# Patient Record
Sex: Male | Born: 1950
Health system: Southern US, Community
[De-identification: ages and names within clinical notes are randomized; demographics above are authoritative.]

## PROBLEM LIST (undated history)

## (undated) DIAGNOSIS — I480 Paroxysmal atrial fibrillation: Secondary | ICD-10-CM

## (undated) DIAGNOSIS — J449 Chronic obstructive pulmonary disease, unspecified: Secondary | ICD-10-CM

## (undated) DIAGNOSIS — E7800 Pure hypercholesterolemia, unspecified: Secondary | ICD-10-CM

## (undated) DIAGNOSIS — Z9981 Dependence on supplemental oxygen: Secondary | ICD-10-CM

## (undated) DIAGNOSIS — I472 Ventricular tachycardia, unspecified: Secondary | ICD-10-CM

## (undated) DIAGNOSIS — M1990 Unspecified osteoarthritis, unspecified site: Secondary | ICD-10-CM

## (undated) DIAGNOSIS — I509 Heart failure, unspecified: Secondary | ICD-10-CM

## (undated) DIAGNOSIS — I2510 Atherosclerotic heart disease of native coronary artery without angina pectoris: Secondary | ICD-10-CM

## (undated) DIAGNOSIS — I5020 Unspecified systolic (congestive) heart failure: Secondary | ICD-10-CM

## (undated) DIAGNOSIS — K219 Gastro-esophageal reflux disease without esophagitis: Secondary | ICD-10-CM

## (undated) DIAGNOSIS — I2699 Other pulmonary embolism without acute cor pulmonale: Secondary | ICD-10-CM

## (undated) DIAGNOSIS — G4730 Sleep apnea, unspecified: Secondary | ICD-10-CM

## (undated) DIAGNOSIS — I219 Acute myocardial infarction, unspecified: Secondary | ICD-10-CM

## (undated) DIAGNOSIS — I255 Ischemic cardiomyopathy: Secondary | ICD-10-CM

## (undated) DIAGNOSIS — Z950 Presence of cardiac pacemaker: Secondary | ICD-10-CM

## (undated) HISTORY — PX: CORONARY ANGIOPLASTY WITH STENT PLACEMENT: SHX49

## (undated) HISTORY — PX: TONSILLECTOMY: SUR1361

## (undated) NOTE — *Deleted (*Deleted)
[facility] EMERGENCY DEPARTMENT Provider Note   CSN: [id number] Arrival date & time: 12/06/19  2148     History Chief Complaint  Patient presents with  . Fall  . Pacemaker Problem    Max Rivas is a 89 y.o. male with a hx of CHF ***, S/p ICD placement, ischemic cardiomyopathy, CAD, paroxysmal afib, vtach, chronic anticoagulation on Eliquis, COPD, chronic respiratory failure on 3L via [place],  prior pulmonary embolism, sleep apnea,   Patient has history of V. tach.  He has a pacer defibrillator that was replaced September 14/2021 at [facility] by Dr. Rhea Belton Shantha.  He also had a VT ablation done at that time.  Patient reports he is compliant with his medications.  He has taken his amiodarone and Eliquis this morning.  He took usual doses last night.  He also takes Toprol XL and reports Mexitil as a new medication for him after his ablation.  Yesterday evening, patient reports he did have some chest pain.  It had pressure quality was very uncomfortable.  He took his medications and waited it out.  He reports he could feel his heart racing.  His heart rate was ranging from 130s to 150s.  He reports his blood pressures were high last night.  [facility]  They advised him to go to the emergency department.  Patient reports he no longer has any chest pain today.  He does not feel lightheaded at this time.  He has not had any syncopal episodes.  He has not been experiencing any pain in his calves or swelling in his legs.  HPI     Past Medical History:  Diagnosis Date  . Arthritis    "elbows, knees" (02/24/2016)  . Bronchitis 2006  . CAD (coronary artery disease)    a. s/p prior MIs - 1995 x 2, 1998; b. s/p prior LCX stenting; c. 04/2019 Cath: LM min irregs, LAD 65p/mi, D1 75, LCX 99ost/p, 44m (ISR), OM2 80, RCA/RPDA mod diff dzs; d. 05/2019 CABG x 3: LIMA->LAD, VG->Diag, VG->OM.  Marland Kitchen COPD (chronic  obstructive pulmonary disease) (HCC)    a. Remote tobacco-->on home O2.  Marland Kitchen GERD (gastroesophageal reflux disease)   . 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 05/2019 Echo: EF 40-45%, gr2 DD. Nl RV size/fxn. Mildly dil LA. Mild MR.  . High cholesterol   . Ischemic cardiomyopathy    a. 05/2019 Echo: EF 40-45%.  . Morbid obesity (HCC)   . Myocardial infarction [facility]) ~ 1995 X 2;1998; 2000; 2004  . On home oxygen therapy    "2L w/activity" (02/24/2016)  . PAF (paroxysmal atrial fibrillation) (HCC)    a. CHA2DS2VASc = 4-->eliquis. Also on amio.  . Pulmonary embolism (HCC) 02/24/2016  . Sleep apnea    "have CPAP; can't tolerate it" (02/24/2016)  . Ventricular tachycardia (HCC)    a. 2005 s/p ICD; b. 03/2011 Device upgrade/lead exchange: MDT Protecta XT VR single lead AICD.    Patient Active Problem List   Diagnosis Date Noted  . Vomiting   . Acute respiratory failure with hypoxia (HCC) 09/18/2019  . Acute on chronic heart failure (HCC) 09/08/2019  . Unstable angina (HCC) 05/21/2019  . V-tach (HCC) 05/12/2019  . Chest pain   . VT (ventricular tachycardia) (HCC) 05/11/2019  . Arrhythmia 03/12/2019  . On home O2   . Chronic combined systolic and diastolic CHF (congestive heart failure) (HCC)  06/22/2018  . Chronic respiratory failure with hypoxia (HCC) 06/22/2018  . Congestive heart failure (CHF) (HCC) 06/22/2018  . DCM (dilated cardiomyopathy) (HCC) 05/25/2017  . Coronary artery disease 05/25/2017  . Chronic atrial fibrillation 05/25/2017  . COPD (chronic obstructive pulmonary disease) (HCC) 05/25/2017  . CKD (chronic kidney disease) 05/25/2017  . Hypothyroidism 05/25/2017  . 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 OSA (obstructive sleep apnea) 05/25/2017  . Pulmonary HTN (HCC) 05/25/2017  . Mediastinal adenopathy   . Cervical adenopathy   . Acute respiratory failure with hypoxia and hypercarbia (HCC) 02/24/2016  . Pulmonary  embolus (HCC) 02/24/2016  . Pulmonary artery thrombosis (HCC)   . Pleural effusion   . Lung mass   . Pleural plaque   . Flutter-fibrillation 01/15/2016  . Acute systolic congestive heart failure (HCC)   . Atrial fibrillation with RVR (HCC)   . Hypercholesterolemia 03/11/2006  . Tobacco abuse 03/11/2006  . Acute on chronic systolic congestive heart failure (HCC) 03/11/2006  . GASTROESOPHAGEAL REFLUX, NO ESOPHAGITIS 03/11/2006  . OSTEOARTHRITIS, MULTI SITES 03/11/2006  . HIGH RISK PATIENT 03/11/2006  . Tobacco dependence 03/11/2006    Past Surgical History:  Procedure Laterality Date  . CARDIOVERSION N/A 09/11/2019   Procedure: CARDIOVERSION;  Surgeon: Jodelle Red, MD;  Location: [facility] ENDOSCOPY;  Service: Cardiovascular;  Laterality: N/A;  . CLIPPING OF ATRIAL APPENDAGE N/A 05/23/2019   Procedure: CLIPPING OF ATRIAL APPENDAGE with atriclip;  Surgeon: [name], MD;  Location: [facility] OR;  Service: Open Heart Surgery;  Laterality: N/A;  . CORONARY ANGIOPLASTY  1995  . CORONARY ANGIOPLASTY WITH STENT PLACEMENT  ~ 1995 - 2004 X 5   "I've got a total of 5 stents" (02/24/2016)  . CORONARY ARTERY BYPASS GRAFT N/A 05/23/2019   Procedure: CORONARY ARTERY BYPASS GRAFTING (CABG), times three, using right greater saphenous vein and left internal mammary;  Surgeon: [name], MD;  Location: [facility] OR;  Service: Open Heart Surgery;  Laterality: N/A;  [place] only  . 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 INTRAVASCULAR PRESSURE WIRE/FFR STUDY N/A 05/12/2019   Procedure: INTRAVASCULAR PRESSURE WIRE/FFR STUDY;  Surgeon: Lyn Records, MD;  Location: [facility] INVASIVE CV LAB;  Service: Cardiovascular;  Laterality: N/A;  . LEFT HEART CATH AND CORONARY ANGIOGRAPHY N/A 05/12/2019   Procedure: LEFT HEART CATH AND CORONARY ANGIOGRAPHY;  Surgeon: Lyn Records, MD;  Location: [facility] INVASIVE CV LAB;  Service: Cardiovascular;  Laterality: N/A;  . PACEMAKER  GENERATOR CHANGE  03/2011    [facility]  . TEE WITHOUT CARDIOVERSION N/A 05/23/2019   Procedure: TRANSESOPHAGEAL ECHOCARDIOGRAM (TEE);  Surgeon: [name], MD;  Location: [facility] OR;  Service: Open Heart Surgery;  Laterality: N/A;  . TONSILLECTOMY         Family History  Problem Relation Age of Onset  . Heart attack Mother   . Heart attack Father   . Heart attack Sister 87    Social History   Tobacco Use  . Smoking status: Former Smoker    Packs/day: 3.00    Years: 35.00    Pack years: 105.00    Types: Cigarettes    Quit date: 05/22/2019    Years since quitting: 0.5  . Smokeless tobacco: Former Neurosurgeon    Quit date: 05/21/2017  Vaping Use  . Vaping Use: Never used  Substance Use Topics  . Alcohol use: Not Currently  . Drug use: Not Currently  Types: Cocaine    Comment: none since 1995    Home Medications Prior to Admission medications   Medication Sig Start Date End Date Taking? Authorizing Provider  acetaminophen (TYLENOL) 325 MG tablet Take 2 tablets (650 mg total) by mouth every 6 (six) hours as needed for mild pain (or Fever >/= 101). 03/05/16   Alison Murray, MD  apixaban (ELIQUIS) 5 MG TABS tablet Take 5 mg by mouth 2 (two) times daily.    [provider]  aspirin 81 MG chewable tablet Chew 1 tablet (81 mg total) by mouth daily. 05/16/19   Kroeger, Ovidio Kin., PA-C  atorvastatin (LIPITOR) 80 MG tablet Take 1 tablet (80 mg total) by mouth daily. Patient taking differently: Take 80 mg by mouth every evening.  05/15/19   Kroeger, Ovidio Kin., PA-C  carboxymethylcellulose (REFRESH PLUS) 0.5 % SOLN Place 1 drop into both eyes at bedtime.    [provider]  Cholecalciferol (VITAMIN D3) 50 MCG (2000 UT) TABS Take 2,000 Units by mouth daily.    [provider]  furosemide (LASIX) 40 MG tablet Take 40 mg by mouth 2 (two) times daily.     [provider]  gabapentin (NEURONTIN) 100 MG capsule Take 2 capsules (200 mg total) by mouth 2 (two) times  daily. 06/01/19   Barrett, Erin R, PA-C  levalbuterol (XOPENEX HFA) 45 MCG/ACT inhaler Inhale 2 puffs into the lungs every 6 (six) hours as needed for wheezing or shortness of breath.    [provider]  levothyroxine (SYNTHROID) 75 MCG tablet Take 75 mcg by mouth daily before breakfast.    [provider]  metoprolol succinate (TOPROL-XL) 25 MG 24 hr tablet Take 12.5 mg by mouth daily.    [provider]  mexiletine (MEXITIL) 150 MG capsule Take 150 mg by mouth 2 (two) times daily.    [provider]  mometasone [facility]) 220 MCG/INH inhaler Inhale 2 puffs into the lungs 2 (two) times daily.     [provider]  montelukast (SINGULAIR) 10 MG tablet Take 10 mg by mouth daily.     [provider]  Multiple Vitamin (MULTIVITAMIN WITH MINERALS) TABS tablet Take 1 tablet by mouth daily.    [provider]  nitroGLYCERIN (NITROSTAT) 0.4 MG SL tablet Place 0.4 mg under the tongue every 5 (five) minutes as needed for chest pain.     [provider]  omega-3 acid ethyl esters (LOVAZA) 1 g capsule Take 1 capsule (1 g total) by mouth 2 (two) times daily. 03/05/16   Alison Murray, MD  omeprazole (PRILOSEC) 20 MG capsule Take 20 mg by mouth 2 (two) times daily before a meal.    [provider]  predniSONE (DELTASONE) 20 MG tablet Take 2 tablets (40 mg total) by mouth daily with breakfast. 09/24/19   Cipriano Bunker, MD  Propylene Glycol (SYSTANE BALANCE) 0.6 % SOLN Place 1 drop into both eyes See admin instructions. Place 1 drop into each eye four times a day and apply a warm compress for 10 minutes afterwards    [provider]  Tiotropium Bromide-Olodaterol (STIOLTO RESPIMAT) 2.5-2.5 MCG/ACT AERS Inhale 2 puffs into the lungs daily.     [provider]    Allergies    Crestor [rosuvastatin calcium]  Review of Systems   Review of Systems  Physical Exam Updated Vital Signs BP 115/61 (BP Location: Right Arm)    Pulse 71   Temp 98.2 F (36.8 C) (Oral)   Resp  11   Ht 5\' 11"  (1.803 m)   Wt 91.6 kg   SpO2 100%   BMI 28.17 kg/m   Physical Exam  ED Results / Procedures / Treatments   Labs (all labs ordered are listed, but only abnormal results are displayed) Labs Reviewed - No data to display  EKG None  Radiology No results found.  Procedures Procedures (including critical care time)  Medications Ordered in ED Medications - No data to display  ED Course  I have reviewed the triage vital signs and the nursing notes.  Pertinent labs & imaging results that were available during my care of the patient were reviewed by me and considered in my medical decision making (see chart for details).    MDM Rules/Calculators/A&P                          *** Final Clinical Impression(s) / ED Diagnoses Final diagnoses:  None    Rx / DC Orders ED Discharge Orders    None

---

## 1993-01-12 HISTORY — PX: CORONARY ANGIOPLASTY: SHX604

## 1999-11-19 ENCOUNTER — Encounter: Payer: Self-pay | Admitting: Emergency Medicine

## 1999-11-19 ENCOUNTER — Inpatient Hospital Stay (HOSPITAL_COMMUNITY): Admission: EM | Admit: 1999-11-19 | Discharge: 1999-11-22 | Payer: Self-pay | Admitting: Emergency Medicine

## 2002-09-28 ENCOUNTER — Encounter: Admission: RE | Admit: 2002-09-28 | Discharge: 2002-09-28 | Payer: Self-pay | Admitting: Family Medicine

## 2002-11-01 ENCOUNTER — Encounter: Admission: RE | Admit: 2002-11-01 | Discharge: 2002-11-01 | Payer: Self-pay | Admitting: Family Medicine

## 2003-02-23 ENCOUNTER — Ambulatory Visit (HOSPITAL_COMMUNITY): Admission: RE | Admit: 2003-02-23 | Discharge: 2003-02-23 | Payer: Self-pay | Admitting: *Deleted

## 2003-03-22 ENCOUNTER — Encounter: Admission: RE | Admit: 2003-03-22 | Discharge: 2003-03-22 | Payer: Self-pay | Admitting: Family Medicine

## 2003-04-01 ENCOUNTER — Encounter: Admission: RE | Admit: 2003-04-01 | Discharge: 2003-04-01 | Payer: Self-pay | Admitting: Sports Medicine

## 2003-04-02 ENCOUNTER — Encounter: Admission: RE | Admit: 2003-04-02 | Discharge: 2003-04-02 | Payer: Self-pay | Admitting: Family Medicine

## 2003-04-17 ENCOUNTER — Encounter: Admission: RE | Admit: 2003-04-17 | Discharge: 2003-04-17 | Payer: Self-pay | Admitting: Family Medicine

## 2003-05-03 ENCOUNTER — Encounter: Admission: RE | Admit: 2003-05-03 | Discharge: 2003-05-03 | Payer: Self-pay | Admitting: Family Medicine

## 2003-05-30 ENCOUNTER — Encounter: Admission: RE | Admit: 2003-05-30 | Discharge: 2003-05-30 | Payer: Self-pay | Admitting: Family Medicine

## 2003-06-14 ENCOUNTER — Encounter: Admission: RE | Admit: 2003-06-14 | Discharge: 2003-06-14 | Payer: Self-pay | Admitting: Family Medicine

## 2003-07-09 ENCOUNTER — Encounter: Admission: RE | Admit: 2003-07-09 | Discharge: 2003-07-09 | Payer: Self-pay | Admitting: Family Medicine

## 2003-07-09 ENCOUNTER — Ambulatory Visit (HOSPITAL_COMMUNITY): Admission: RE | Admit: 2003-07-09 | Discharge: 2003-07-09 | Payer: Self-pay | Admitting: Family Medicine

## 2003-07-12 ENCOUNTER — Ambulatory Visit (HOSPITAL_COMMUNITY): Admission: RE | Admit: 2003-07-12 | Discharge: 2003-07-12 | Payer: Self-pay | Admitting: Cardiology

## 2003-07-13 HISTORY — PX: INSERT / REPLACE / REMOVE PACEMAKER: SUR710

## 2003-07-17 ENCOUNTER — Inpatient Hospital Stay (HOSPITAL_COMMUNITY): Admission: AD | Admit: 2003-07-17 | Discharge: 2003-07-18 | Payer: Self-pay | Admitting: Internal Medicine

## 2003-08-09 ENCOUNTER — Encounter: Admission: RE | Admit: 2003-08-09 | Discharge: 2003-08-09 | Payer: Self-pay | Admitting: Family Medicine

## 2003-09-05 ENCOUNTER — Encounter: Admission: RE | Admit: 2003-09-05 | Discharge: 2003-09-05 | Payer: Self-pay | Admitting: Family Medicine

## 2003-10-16 ENCOUNTER — Ambulatory Visit: Payer: Self-pay | Admitting: Internal Medicine

## 2003-11-02 ENCOUNTER — Ambulatory Visit: Payer: Self-pay | Admitting: Sports Medicine

## 2003-11-17 ENCOUNTER — Inpatient Hospital Stay (HOSPITAL_COMMUNITY): Admission: EM | Admit: 2003-11-17 | Discharge: 2003-11-17 | Payer: Self-pay | Admitting: Emergency Medicine

## 2003-12-04 ENCOUNTER — Ambulatory Visit: Payer: Self-pay | Admitting: Internal Medicine

## 2004-01-13 DIAGNOSIS — J40 Bronchitis, not specified as acute or chronic: Secondary | ICD-10-CM

## 2004-01-13 HISTORY — DX: Bronchitis, not specified as acute or chronic: J40

## 2004-11-11 ENCOUNTER — Ambulatory Visit: Payer: Self-pay | Admitting: Internal Medicine

## 2004-11-18 ENCOUNTER — Ambulatory Visit: Payer: Self-pay

## 2006-03-11 DIAGNOSIS — K219 Gastro-esophageal reflux disease without esophagitis: Secondary | ICD-10-CM | POA: Insufficient documentation

## 2006-03-11 DIAGNOSIS — F17200 Nicotine dependence, unspecified, uncomplicated: Secondary | ICD-10-CM | POA: Insufficient documentation

## 2006-03-11 DIAGNOSIS — I5023 Acute on chronic systolic (congestive) heart failure: Secondary | ICD-10-CM | POA: Insufficient documentation

## 2006-03-11 DIAGNOSIS — I2510 Atherosclerotic heart disease of native coronary artery without angina pectoris: Secondary | ICD-10-CM | POA: Insufficient documentation

## 2006-03-11 DIAGNOSIS — Z789 Other specified health status: Secondary | ICD-10-CM | POA: Insufficient documentation

## 2006-03-11 DIAGNOSIS — M1990 Unspecified osteoarthritis, unspecified site: Secondary | ICD-10-CM | POA: Insufficient documentation

## 2006-03-11 DIAGNOSIS — Z720 Tobacco use: Secondary | ICD-10-CM | POA: Insufficient documentation

## 2006-03-11 DIAGNOSIS — E7800 Pure hypercholesterolemia, unspecified: Secondary | ICD-10-CM | POA: Insufficient documentation

## 2008-02-10 ENCOUNTER — Encounter (INDEPENDENT_AMBULATORY_CARE_PROVIDER_SITE_OTHER): Payer: Self-pay | Admitting: *Deleted

## 2008-08-27 ENCOUNTER — Encounter (INDEPENDENT_AMBULATORY_CARE_PROVIDER_SITE_OTHER): Payer: Self-pay | Admitting: *Deleted

## 2009-03-14 ENCOUNTER — Telehealth: Payer: Self-pay | Admitting: Gastroenterology

## 2010-02-13 NOTE — Progress Notes (Signed)
Summary: Schedule Colonoscopy  Phone Note Outgoing Call Call back at Home Phone [phone]   Call placed by: Harlow Mares CMA Duncan Dull),  March 14, 2009 3:01 PM Call placed to: Patient Summary of Call: Left message on patients machine to call back, need to know if patient is getting GI procedures done at the [facility] now or if he would like to have his colonoscopy done with our office. Either way if he could please call us back. He is due for his colonoscopy.  Initial call taken by: Harlow Mares CMA Duncan Dull),  March 14, 2009 3:04 PM  Follow-up for Phone Call        patient is having his colonoscopys done at the [place].  Follow-up by: Harlow Mares CMA Duncan Dull),  March 21, 2009 4:03 PM

## 2011-03-13 HISTORY — PX: PACEMAKER GENERATOR CHANGE: SHX5998

## 2016-01-15 ENCOUNTER — Inpatient Hospital Stay (HOSPITAL_COMMUNITY)
Admission: EM | Admit: 2016-01-15 | Discharge: 2016-01-19 | DRG: 291 | Disposition: A | Discharge status: Home / Self Care | Payer: Non-veteran care | Attending: Family Medicine | Admitting: Family Medicine

## 2016-01-15 ENCOUNTER — Encounter (HOSPITAL_COMMUNITY): Payer: Self-pay | Admitting: Emergency Medicine

## 2016-01-15 ENCOUNTER — Emergency Department (HOSPITAL_COMMUNITY): Payer: Non-veteran care

## 2016-01-15 DIAGNOSIS — I959 Hypotension, unspecified: Secondary | ICD-10-CM | POA: Diagnosis not present

## 2016-01-15 DIAGNOSIS — I4891 Unspecified atrial fibrillation: Secondary | ICD-10-CM | POA: Diagnosis present

## 2016-01-15 DIAGNOSIS — Z6835 Body mass index (BMI) 35.0-35.9, adult: Secondary | ICD-10-CM

## 2016-01-15 DIAGNOSIS — Z955 Presence of coronary angioplasty implant and graft: Secondary | ICD-10-CM

## 2016-01-15 DIAGNOSIS — Z87891 Personal history of nicotine dependence: Secondary | ICD-10-CM

## 2016-01-15 DIAGNOSIS — Z950 Presence of cardiac pacemaker: Secondary | ICD-10-CM

## 2016-01-15 DIAGNOSIS — Z95810 Presence of automatic (implantable) cardiac defibrillator: Secondary | ICD-10-CM

## 2016-01-15 DIAGNOSIS — G4733 Obstructive sleep apnea (adult) (pediatric): Secondary | ICD-10-CM | POA: Diagnosis present

## 2016-01-15 DIAGNOSIS — J449 Chronic obstructive pulmonary disease, unspecified: Secondary | ICD-10-CM | POA: Diagnosis present

## 2016-01-15 DIAGNOSIS — I5021 Acute systolic (congestive) heart failure: Secondary | ICD-10-CM

## 2016-01-15 DIAGNOSIS — Z8249 Family history of ischemic heart disease and other diseases of the circulatory system: Secondary | ICD-10-CM | POA: Diagnosis not present

## 2016-01-15 DIAGNOSIS — I4892 Unspecified atrial flutter: Secondary | ICD-10-CM | POA: Diagnosis present

## 2016-01-15 DIAGNOSIS — IMO0002 Reserved for concepts with insufficient information to code with codable children: Secondary | ICD-10-CM | POA: Diagnosis present

## 2016-01-15 DIAGNOSIS — I2510 Atherosclerotic heart disease of native coronary artery without angina pectoris: Secondary | ICD-10-CM | POA: Diagnosis present

## 2016-01-15 DIAGNOSIS — Z66 Do not resuscitate: Secondary | ICD-10-CM | POA: Diagnosis present

## 2016-01-15 DIAGNOSIS — J9601 Acute respiratory failure with hypoxia: Secondary | ICD-10-CM | POA: Diagnosis present

## 2016-01-15 DIAGNOSIS — I498 Other specified cardiac arrhythmias: Secondary | ICD-10-CM

## 2016-01-15 DIAGNOSIS — I252 Old myocardial infarction: Secondary | ICD-10-CM | POA: Diagnosis not present

## 2016-01-15 DIAGNOSIS — I420 Dilated cardiomyopathy: Secondary | ICD-10-CM | POA: Diagnosis present

## 2016-01-15 DIAGNOSIS — Z7982 Long term (current) use of aspirin: Secondary | ICD-10-CM

## 2016-01-15 DIAGNOSIS — N179 Acute kidney failure, unspecified: Secondary | ICD-10-CM | POA: Diagnosis present

## 2016-01-15 DIAGNOSIS — Z79899 Other long term (current) drug therapy: Secondary | ICD-10-CM | POA: Diagnosis not present

## 2016-01-15 DIAGNOSIS — I5023 Acute on chronic systolic (congestive) heart failure: Secondary | ICD-10-CM | POA: Diagnosis present

## 2016-01-15 DIAGNOSIS — Z23 Encounter for immunization: Secondary | ICD-10-CM | POA: Diagnosis not present

## 2016-01-15 DIAGNOSIS — K219 Gastro-esophageal reflux disease without esophagitis: Secondary | ICD-10-CM | POA: Diagnosis present

## 2016-01-15 HISTORY — DX: Heart failure, unspecified: I50.9

## 2016-01-15 HISTORY — DX: Presence of cardiac pacemaker: Z95.0

## 2016-01-15 HISTORY — DX: Chronic obstructive pulmonary disease, unspecified: J44.9

## 2016-01-15 HISTORY — DX: Gastro-esophageal reflux disease without esophagitis: K21.9

## 2016-01-15 HISTORY — DX: Atherosclerotic heart disease of native coronary artery without angina pectoris: I25.10

## 2016-01-15 LAB — CBC
HEMATOCRIT: 45.5 % (ref 39.0–52.0)
HEMOGLOBIN: 15 g/dL (ref 13.0–17.0)
MCH: 31.6 pg (ref 26.0–34.0)
MCHC: 33 g/dL (ref 30.0–36.0)
MCV: 96 fL (ref 78.0–100.0)
Platelets: 197 10*3/uL (ref 150–400)
RBC: 4.74 MIL/uL (ref 4.22–5.81)
RDW: 14.8 % (ref 11.5–15.5)
WBC: 7.8 10*3/uL (ref 4.0–10.5)

## 2016-01-15 LAB — INFLUENZA PANEL BY PCR (TYPE A & B)
INFLBPCR: NEGATIVE
Influenza A By PCR: NEGATIVE

## 2016-01-15 LAB — BASIC METABOLIC PANEL
ANION GAP: 10 (ref 5–15)
BUN: 14 mg/dL (ref 6–20)
CHLORIDE: 101 mmol/L (ref 101–111)
CO2: 27 mmol/L (ref 22–32)
Calcium: 9.2 mg/dL (ref 8.9–10.3)
Creatinine, Ser: 1.24 mg/dL (ref 0.61–1.24)
GFR calc non Af Amer: >60 mL/min (ref >60)
Glucose, Bld: 107 mg/dL — ABNORMAL HIGH (ref 65–99)
POTASSIUM: 4.3 mmol/L (ref 3.5–5.1)
SODIUM: 138 mmol/L (ref 135–145)

## 2016-01-15 LAB — I-STAT TROPONIN, ED: Troponin i, poc: 0.02 ng/mL (ref 0.00–0.08)

## 2016-01-15 LAB — TSH: TSH: 2.898 u[IU]/mL (ref 0.350–4.500)

## 2016-01-15 LAB — MRSA PCR SCREENING: MRSA BY PCR: NEGATIVE

## 2016-01-15 LAB — BRAIN NATRIURETIC PEPTIDE: B NATRIURETIC PEPTIDE 5: 599.4 pg/mL — AB (ref 0.0–100.0)

## 2016-01-15 LAB — MAGNESIUM: MAGNESIUM: 2.2 mg/dL (ref 1.7–2.4)

## 2016-01-15 MED ORDER — METOPROLOL SUCCINATE ER 25 MG PO TB24: 12.5000 mg | ORAL_TABLET | Freq: Every day | ORAL | Status: DC

## 2016-01-15 MED ORDER — METOPROLOL SUCCINATE ER 50 MG PO TB24
75.0000 mg | ORAL_TABLET | Freq: Two times a day (BID) | ORAL | Status: DC
  Administered 2016-01-15 – 2016-01-19 (×8): 75 mg via ORAL
  Filled 2016-01-15 (×8): qty 1

## 2016-01-15 MED ORDER — INFLUENZA VAC SPLIT QUAD 0.5 ML IM SUSY
0.5000 mL | PREFILLED_SYRINGE | INTRAMUSCULAR | Status: AC
  Administered 2016-01-16: 0.5 mL via INTRAMUSCULAR
  Filled 2016-01-15: qty 0.5

## 2016-01-15 MED ORDER — SODIUM CHLORIDE 0.9% FLUSH
3.0000 mL | Freq: Two times a day (BID) | INTRAVENOUS | Status: DC
  Administered 2016-01-15 – 2016-01-19 (×5): 3 mL via INTRAVENOUS

## 2016-01-15 MED ORDER — FUROSEMIDE 10 MG/ML IJ SOLN
40.0000 mg | Freq: Two times a day (BID) | INTRAMUSCULAR | Status: DC
  Administered 2016-01-16 – 2016-01-19 (×7): 40 mg via INTRAVENOUS
  Filled 2016-01-15 (×7): qty 4

## 2016-01-15 MED ORDER — ATORVASTATIN CALCIUM 20 MG PO TABS
20.0000 mg | ORAL_TABLET | Freq: Every day | ORAL | Status: DC
  Administered 2016-01-16 – 2016-01-18 (×3): 20 mg via ORAL
  Filled 2016-01-15 (×3): qty 1

## 2016-01-15 MED ORDER — DIGOXIN 250 MCG PO TABS
0.2500 mg | ORAL_TABLET | Freq: Every day | ORAL | Status: DC
Start: 2016-01-15 — End: 2016-01-19
  Administered 2016-01-15 – 2016-01-19 (×5): 0.25 mg via ORAL
  Filled 2016-01-15 (×5): qty 1

## 2016-01-15 MED ORDER — DILTIAZEM HCL 100 MG IV SOLR
5.0000 mg/h | INTRAVENOUS | Status: DC
  Administered 2016-01-15: 5 mg/h via INTRAVENOUS
  Filled 2016-01-15: qty 100

## 2016-01-15 MED ORDER — SPIRONOLACTONE 25 MG PO TABS
12.5000 mg | ORAL_TABLET | Freq: Every day | ORAL | Status: DC
  Administered 2016-01-15 – 2016-01-16 (×2): 12.5 mg via ORAL
  Filled 2016-01-15 (×2): qty 1

## 2016-01-15 MED ORDER — SODIUM CHLORIDE 0.9% FLUSH: 3.0000 mL | INTRAVENOUS | Status: DC | PRN

## 2016-01-15 MED ORDER — FUROSEMIDE 10 MG/ML IJ SOLN: 40.0000 mg | Freq: Two times a day (BID) | INTRAMUSCULAR | Status: DC

## 2016-01-15 MED ORDER — SODIUM CHLORIDE 0.9 % IV SOLN: 250.0000 mL | INTRAVENOUS | Status: DC | PRN

## 2016-01-15 MED ORDER — RIVAROXABAN 15 MG PO TABS: 15.0000 mg | ORAL_TABLET | Freq: Every day | ORAL | Status: DC

## 2016-01-15 MED ORDER — ONDANSETRON HCL 4 MG/2ML IJ SOLN: 4.0000 mg | Freq: Four times a day (QID) | INTRAMUSCULAR | Status: DC | PRN

## 2016-01-15 MED ORDER — FUROSEMIDE 10 MG/ML IJ SOLN
40.0000 mg | Freq: Once | INTRAMUSCULAR | Status: AC
  Administered 2016-01-15: 40 mg via INTRAVENOUS
  Filled 2016-01-15: qty 4

## 2016-01-15 MED ORDER — RIVAROXABAN 20 MG PO TABS
20.0000 mg | ORAL_TABLET | Freq: Every day | ORAL | Status: DC
  Administered 2016-01-16 – 2016-01-18 (×3): 20 mg via ORAL
  Filled 2016-01-15 (×4): qty 1

## 2016-01-15 MED ORDER — ACETAMINOPHEN 325 MG PO TABS: 650.0000 mg | ORAL_TABLET | ORAL | Status: DC | PRN

## 2016-01-15 NOTE — ED Triage Notes (Signed)
Sob and feet swelling x 2 days , states got the flu and they put him on  Fluid pills now his feet are swollen

## 2016-01-15 NOTE — ED Notes (Signed)
Dinner tray ordered; heart healthy diet 

## 2016-01-15 NOTE — ED Provider Notes (Signed)
[facility]-EMERGENCY DEPT Provider Note   CSN: [id number] Arrival date & time: 01/15/16  1251     History   Chief Complaint Chief Complaint  Patient presents with  . Shortness of Breath    HPI Max Rivas is a 66 y.o. male.  HPI  66 year old male who presents with shortness of breath. He has history of COPD, CAD s/p stenting, CHF s/p PPM. Cardiology care primarily through the [place] [facility] [place]. States 4 days ago developed cough productive of clear sputum with sensation feeling hot and cold. He felt like he may have developed flulike illness and began to take over-the-counter cold medications without good effect. Over the past 2 days he has had progressively worsening shortness of breath. States that he normally takes walks without difficulty, walking across the room feels very winded. Has been using albuterol inhaler without improvement in dyspnea. Has noted increasing abdominal distention and lower extremity edema. Developed orthopnea and PND which is new for him. His never had symptoms like this before. States that his doctor did place him on fluid pill back in September which she takes every other day as prescribed. No chest pain, nausea or vomiting, abdominal pain, diarrhea, urinary complaints.  Past Medical History:  Diagnosis Date  . COPD (chronic obstructive pulmonary disease) (HCC)   . Coronary artery disease   . GERD (gastroesophageal reflux disease)   . Pacemaker     Patient Active Problem List   Diagnosis Date Noted  . HYPERCHOLESTEROLEMIA 03/11/2006  . TOBACCO DEPENDENCE 03/11/2006  . CORONARY, ARTERIOSCLEROSIS 03/11/2006  . CHF - EJECTION FRACTION < 50% 03/11/2006  . GASTROESOPHAGEAL REFLUX, NO ESOPHAGITIS 03/11/2006  . OSTEOARTHRITIS, MULTI SITES 03/11/2006  . HIGH RISK PATIENT 03/11/2006    Past Surgical History:  Procedure Laterality Date  . CARDIAC CATHETERIZATION     6 stents per patient  . PACEMAKER GENERATOR CHANGE     original PPM around 2004 for ICM  with EF < 35%, generator change 03/21/2011 in [facility] Medications    Prior to Admission medications   Not on File    Family History Family History  Problem Relation Age of Onset  . Heart attack Mother   . Heart attack Father   . Heart attack Sister 9    Social History Social History  Substance Use Topics  . Smoking status: Former Games developer  . Smokeless tobacco: Never Used  . Alcohol use No     Allergies   Patient has no known allergies.   Review of Systems Review of Systems 10/14 systems reviewed and are negative other than those stated in the HPI   Physical Exam Updated Vital Signs BP 121/88   Pulse 67   Temp 97.6 F (36.4 C) (Oral)   Resp 26   Ht 5\' 11"  (1.803 m)   Wt 255 lb 11.7 oz (116 kg)   SpO2 92%   BMI 35.67 kg/m   Physical Exam Physical Exam  Nursing note and vitals reviewed. Constitutional:  non-toxic, and in no acute distress Head: Normocephalic and atraumatic.  Mouth/Throat: Oropharynx is clear and moist.  Neck: Normal range of motion. Neck supple.  Cardiovascular: Tachycardic rate and irregularly irregular rhythm.   Pulmonary/Chest: Mild tachypnea without conversational dyspnea, scattered expiratory wheezing Abdominal: Soft. There is no tenderness. There is no rebound and no guarding.  Musculoskeletal: Normal range of motion.  +1 bilateral lower extremity edema  Neurological: Alert, no facial droop, fluent speech, moves all extremities symmetrically Skin: Skin  is warm and dry.  Psychiatric: Cooperative   ED Treatments / Results  Labs (all labs ordered are listed, but only abnormal results are displayed) Labs Reviewed  BASIC METABOLIC PANEL - Abnormal; Notable for the following:       Result Value   Glucose, Bld 107 (*)    All other components within normal limits  BRAIN NATRIURETIC PEPTIDE - Abnormal; Notable for the following:    B Natriuretic Peptide 599.4 (*)    All other components within normal limits  CBC    MAGNESIUM  TSH  INFLUENZA PANEL BY PCR (TYPE A & B, H1N1)  I-STAT TROPOININ, ED    EKG  EKG Interpretation  Date/Time:  Wednesday January 15 2016 12:56:17 EST Ventricular Rate:  111 PR Interval:    QRS Duration: 86 QT Interval:  322 QTC Calculation: 437 R Axis:   52 Text Interpretation:  Atrial fibrillation with rapid ventricular response with premature ventricular or aberrantly conducted complexes Possible Inferior infarct , age undetermined Abnormal ECG no prior history of Afib Confirmed by Leandre Wien MD, Makenzi Bannister [phone]) on 01/15/2016 3:53:08 PM       Radiology Dg Chest 2 View  Result Date: 01/15/2016 CLINICAL DATA:  66 year old male with shortness of Breath for 2 days. Recent lower extremity swelling also. Smoker. COPD. Initial encounter. EXAM: CHEST  2 VIEW COMPARISON:  Portable chest 11/17/2003, two-view chest 07/18/2003. FINDINGS: Chronic left chest cardiac AICD. One abandoned lead now present. Chronic cardiomegaly, stable to mildly increased. Other mediastinal contours remain normal. Visualized tracheal air column is within normal limits. Small bilateral pleural effusions are new, larger on the right. Patchy associated bibasilar opacity. Increased pulmonary vascularity. No pneumothorax. No acute osseous abnormality identified. IMPRESSION: Pulmonary interstitial edema with new small small pleural effusions, greater on the right. Superimposed Patchy bibasilar opacity probably is atelectasis. Electronically Signed   By: Odessa Fleming M.D.   On: 01/15/2016 13:27    Procedures Procedures (including critical care time)  Medications Ordered in ED Medications  furosemide (LASIX) injection 40 mg (40 mg Intravenous Given 01/15/16 1626)     Initial Impression / Assessment and Plan / ED Course  I have reviewed the triage vital signs and the nursing notes.  Pertinent labs & imaging results that were available during my care of the patient were reviewed by me and considered in my medical decision making  (see chart for details).  Clinical Course      Presenting with symptoms of acute CHF exacerbation.   He nontoxic in no acute distress. O2 sats in the low 90 percentage with mild tachypnea. Wheezing on exam and does look fluid overloaded. Chest x-ray visualized showing interstitial edema. With elevated BNP in the 500s. Given 40 mg of IV Lasix with good urine output. No leukocytosis, fever, and lower suspicion for pneumonia.  Patient also with atrial fibrillation with heart rate in the low 100s. He has no known prior history of this. He requires anticoagulation, and discussed heparin versus oral anticoagulant with [facility] hospitalist. They will await consult with cardiology to decide which anticoagulation to start. Influenza swab also sent per hospitalist request.  Admitted to telemetry  This patients CHA2DS2-VASc Score and unadjusted Ischemic Stroke Rate (% per year) is equal to 3.2 % stroke rate/year from a score of 3     Final Clinical Impressions(s) / ED Diagnoses   Final diagnoses:  Acute systolic congestive heart failure (HCC)    New Prescriptions New Prescriptions   No medications on file  Lavera Guise, MD 01/15/16 1723

## 2016-01-15 NOTE — ED Notes (Signed)
Attempted report 

## 2016-01-15 NOTE — H&P (Signed)
History and Physical    Max Rivas RUE:[id number] DOB: 1950/09/04 DOA: 01/15/2016  PCP: Centricity User, MD (Inactive)  Patient coming from: home  Chief Complaint: sob, swelling  HPI:  66 y/o ? Veteran-primary care physician Dr. Mack Guise at [facility] Significant history of coronary artery disease             -MI 1997 status post stent             -MI status post stent 2000             -MI at [facility] with 3 stents placed             -Myoview 2006 seems to have been normal History of chronic systolic heart failure recently placed in September on Lasix Monday Wednesday Friday History cardiomyopathy with pacemaker placed by Dr. Ladona Ridgel in 2005 Obstructive sleep apnea unable to tolerate BiPAP Morbid obesity, Body mass index is 35.67 kg/m.  COPD Former smoker-until October 2017  1[birth year]52 on 12/26 h/o flulike symptoms, roommate had this. The headache week. Treated with NyQuil cold and flu also had some dizziness-symptoms resolved over 3-4 days Felt short of breath with ambulation-could only go to her mailbox and back, also dizzy and tachycardic Noticed swelling of right ankle which progressed up to his knees bilaterally Called PCPs office but could not get in until 1/4/8 Decided to come to emergency room   ED Course:  In the emergency room found to be in new onset flutter with RVR ACUTE decompensated systolic heart failure   EKG on admit shows flutter and QRS axis is 45 rate related changes with deep S waves across precordium Pro BNP 599 Troponin 0.02 TSH 2.8 Chest x-ray interstitial edema, new pleural effusions  Review of Systems:   Denies the following Tarry stool, dysuria, vomiting, nausea, current headache, unilateral weakness, seizure, blurred vision, double vision, rash, Does have ill contacts  Past Medical History:  Diagnosis Date  . COPD (chronic obstructive pulmonary disease) (HCC)   . Coronary artery disease   . GERD (gastroesophageal reflux disease)     . Pacemaker     Past Surgical History:  Procedure Laterality Date  . CARDIAC CATHETERIZATION     6 stents per patient  . PACEMAKER GENERATOR CHANGE     original PPM around 2004 for ICM with EF < 35%, generator change 03/21/2011 in [place]     reports that he has quit smoking. He has never used smokeless tobacco. He reports that he does not drink alcohol or use drugs. Former cocaine use Smoker till end of 2017 Occasional drinker beer 2-3 pints a week  No Known Allergies  Family History  Problem Relation Age of Onset  . Heart attack Mother   . Heart attack Father   . Heart attack Sister 13   Mother passed of heart attack at age 64 Father passed of heart attack at age 94 7   Prior to Admission medications   Not on File    Physical Exam: Vitals:   01/15/16 1700 01/15/16 1715 01/15/16 1730 01/15/16 1745  BP: 121/88 117/82 111/82 110/86  Pulse: 67  (!) 50 60  Resp: 26 21 20 24   Temp:      TempSrc:      SpO2: 92%  93% 93%  Weight:      Height:          Constitutional: NAD, calm, comfortable Vitals:   01/15/16 1700 01/15/16 1715 01/15/16 1730 01/15/16 1745  BP: 121/88 117/82 111/82 110/86  Pulse: 67  (!) 50 60  Resp: 26 21 20 24   Temp:      TempSrc:      SpO2: 92%  93% 93%  Weight:      Height:       Eyes: PERRL, lids and conjunctivae normal, Thick neck ENMT: Mucous membranes are moist. Posterior pharynx clear of any exudate or lesions.Normal dentition.  Neck: Thick neck, supple, no masses, no thyromegaly, Mallampati 4 Respiratory: clear to auscultation bilaterally, no wheezing, no crackles. Normal respiratory effort. No accessory muscle use.  Cardiovascular: Irregularly irregular. Plus edema. 2+ pedal pulses. No carotid bruits.  Abdomen: no tenderness, no masses palpated. No hepatosplenomegaly. Bowel sounds positive.  Musculoskeletal: no clubbing / cyanosis. No joint deformity upper and lower extremities. Good ROM, no contractures. Normal muscle tone.   Skin: no rashes, lesions, ulcers. No induration Neurologic: CN 2-12 grossly intact. Sensation intact, DTR normal. Strength 5/5 in all 4.  Psychiatric: Normal judgment and insight. Alert and oriented x 3. Normal mood.    Labs on Admission: I have personally reviewed following labs and imaging studies  CBC:  Recent Labs Lab 01/15/16 1301  WBC 7.8  HGB 15.0  HCT 45.5  MCV 96.0  PLT 197   Basic Metabolic Panel:  Recent Labs Lab 01/15/16 1301 01/15/16 1605  NA 138  --   K 4.3  --   CL 101  --   CO2 27  --   GLUCOSE 107*  --   BUN 14  --   CREATININE 1.24  --   CALCIUM 9.2  --   MG  --  2.2   GFR: Estimated Creatinine Clearance: 79 mL/min (by C-G formula based on SCr of 1.24 mg/dL). Liver Function Tests: No results for input(s): AST, ALT, ALKPHOS, BILITOT, PROT, ALBUMIN in the last 168 hours. No results for input(s): LIPASE, AMYLASE in the last 168 hours. No results for input(s): AMMONIA in the last 168 hours. Coagulation Profile: No results for input(s): INR, PROTIME in the last 168 hours. Cardiac Enzymes: No results for input(s): CKTOTAL, CKMB, CKMBINDEX, TROPONINI in the last 168 hours. BNP (last 3 results) No results for input(s): PROBNP in the last 8760 hours. HbA1C: No results for input(s): HGBA1C in the last 72 hours. CBG: No results for input(s): GLUCAP in the last 168 hours. Lipid Profile: No results for input(s): CHOL, HDL, LDLCALC, TRIG, CHOLHDL, LDLDIRECT in the last 72 hours. Thyroid Function Tests:  Recent Labs  01/15/16 1605  TSH 2.898   Anemia Panel: No results for input(s): VITAMINB12, FOLATE, FERRITIN, TIBC, IRON, RETICCTPCT in the last 72 hours. Urine analysis: No results found for: COLORURINE, APPEARANCEUR, LABSPEC, PHURINE, GLUCOSEU, HGBUR, BILIRUBINUR, KETONESUR, PROTEINUR, UROBILINOGEN, NITRITE, LEUKOCYTESUR Sepsis Labs: !!!!!!!!!!!!!!!!!!!!!!!!!!!!!!!!!!!!!!!!!!!! @LABRCNTIP (procalcitonin:4,lacticidven:4) )No results found for this  or any previous visit (from the past 240 hour(s)).   Radiological Exams on Admission: Dg Chest 2 View  Result Date: 01/15/2016 CLINICAL DATA:  66 year old male with shortness of Breath for 2 days. Recent lower extremity swelling also. Smoker. COPD. Initial encounter. EXAM: CHEST  2 VIEW COMPARISON:  Portable chest 11/17/2003, two-view chest 07/18/2003. FINDINGS: Chronic left chest cardiac AICD. One abandoned lead now present. Chronic cardiomegaly, stable to mildly increased. Other mediastinal contours remain normal. Visualized tracheal air column is within normal limits. Small bilateral pleural effusions are new, larger on the right. Patchy associated bibasilar opacity. Increased pulmonary vascularity. No pneumothorax. No acute osseous abnormality identified. IMPRESSION: Pulmonary interstitial edema with new small small pleural  effusions, greater on the right. Superimposed Patchy bibasilar opacity probably is atelectasis. Electronically Signed   By: Odessa Fleming M.D.   On: 01/15/2016 13:27  All home meds have not been reconciled so we will attempt to reconcile them once pharmacy tech comes by and sees the patient  Acute decompensated systolic heart failure Given Lasix 40 mg in ED Is Lasix nave at home-only on 20 mg MWF so started 40 mg IV twice a day Repeat echocardiogram Cardiology consult pending  New onset A. fib flutter Probably brought on by beta agonist albuterol Rate control with Cardizem gtt. Continue Toprol-XL 75 2 times a day Cardiology to comment Has been started on Xarelto for A. Flutter  Obstructive sleep apnea not on BiPAP Needs sleep study has now outpatient Probable substrate for flutter fib  Recent flu Not a candidate for Tamiflu currently  Morbid obesity, Body mass index is 35.67 kg/m. Needs outpatient management   DO NOT RESUSCITATE status Inpatient stepdown No family present at the bedside Will need inpatient setting to control heart rate and consideration of  flutter ablation? Per cardiology   Rhetta Mura MD [facility] Pager [phone]  If 7PM-7AM, please contact night-coverage [email or web address] Password [email or web address]  01/15/2016, 5:52 PM

## 2016-01-15 NOTE — H&P (Deleted)
Patient ID: Max Rivas MRN: [id number], DOB/AGE: [birth year]   Admit date: 01/15/2016   Primary Physician: Centricity User, MD (Inactive) Primary Cardiologist: Unknown cardiologist at [facility]   Pt. Profile:  Max Rivas is an obese pleasant caucasian male with PMH of CAD s/p 6 stents, ICM with baseline EF 20% s/p likely ICD for primary prevention, chronic systolic heart failure presented with new onset of atrial flutter ablation with RVR and also acute on chronic systolic heart failure  Problem List  Past Medical History:  Diagnosis Date  . COPD (chronic obstructive pulmonary disease) (HCC)   . Coronary artery disease   . GERD (gastroesophageal reflux disease)   . Pacemaker     Past Surgical History:  Procedure Laterality Date  . CARDIAC CATHETERIZATION     6 stents per patient  . PACEMAKER GENERATOR CHANGE     original PPM around 2004 for ICM with EF < 35%, generator change 03/21/2011 in [place]     Allergies  No Known Allergies  HPI  Max Rivas is an obese pleasant caucasian male with PMH of CAD s/p 6 stents, ICM with baseline EF 20% s/p likely ICD for primary prevention, chronic systolic heart failure. Majority of his previous record was unavailable to me. His previous hospitalization was in 2004/2005 which is around the time of his last reported cardiac catheterization. He has not had any angina since. He is no longer taking Plavix, he is on aspirin 81 mg daily. He has not been seen at [facility] since that time. He also reportedly had ICD placement in 2004, and later had a generator change at [facility] in [place] on 03/21/2011. He is followed by primary care physician Dr. Mack Guise in [facility], unfortunately he does not recall the name of his cardiologist. He says he did see his cardiologist within the past year. His last stress test was around 2014/2015, which reportedly was normal. He also had an echocardiogram more than 3 years ago, however he was told  his overall ejection fraction did not improve when compared to 2004/2005.   Otherwise, he was placed on 20 mg Lasix every Monday Wednesday Friday for ankle edema since September 2017. He did not notice any significant improvement in lower extremity edema. For the past week, he started having productive cough and paroxysmal nocturnal dyspnea. He denies any chest pain and he only has occasional palpitation when he is short of breath. For the past 24 hours, he began to notice abdominal distention, worsening lower extremity edema increasing all the way up to the knee and also worsening shortness of breath as well. He called his primary care physician who was unable to see him today and he eventually sought medical attention at [facility]. He denies any recent angina symptoms.   According to the patient, his home medications include Aspirin 81 mg daily Lasix 20 mg Monday Wednesday Friday Valsartan 40 mg tablet taking 20 mg twice a day Toprol-XL 100 mg tablet taking 50 mg twice a day Prilosec 20 mg twice a day.   Family History  Family History  Problem Relation Age of Onset  . Heart attack Mother   . Heart attack Father   . Heart attack Sister 26    Social History  Social History   Social History  . Marital status: Divorced    Spouse name: N/A  . Number of children: N/A  . Years of education: N/A   Occupational History  . Not on file.  Social History Main Topics  . Smoking status: Former Games developer  . Smokeless tobacco: Never Used  . Alcohol use No  . Drug use: No  . Sexual activity: Not on file   Other Topics Concern  . Not on file   Social History Narrative  . No narrative on file     Review of Systems General:  No chills, fever, night sweats or weight changes.  Cardiovascular:  No chest pain, dyspnea on exertion, orthopnea +edema, palpitations, paroxysmal nocturnal dyspnea. Dermatological: No rash, lesions/masses Respiratory: +cough, dyspnea Urologic: No  hematuria, dysuria Abdominal:   No nausea, vomiting, diarrhea, bright red blood per rectum, melena, or hematemesis Neurologic:  No visual changes, wkns, changes in mental status. All other systems reviewed and are otherwise negative except as noted above.  Physical Exam  Blood pressure 127/71, pulse (!) 52, temperature 97.6 F (36.4 C), temperature source Oral, resp. rate 26, height 5\' 11"  (1.803 m), weight 255 lb 11.7 oz (116 kg), SpO2 94 %.  General: Pleasant, NAD Psych: Normal affect. Neuro: Alert and oriented X 3. Moves all extremities spontaneously. HEENT: Normal  Neck: Supple without bruits or JVD. Lungs:  Resp regular and unlabored. Mildly diminished breath sound in bilateral bases Heart: Irregularly irregular no s3, s4, or murmurs. Abdomen: Soft, non-tender, non-distended, BS + x 4.  Extremities: No clubbing, cyanosis. DP/PT/Radials 2+ and equal bilaterally. 3+ pitting edema in bilateral lower extremity  Labs  Troponin (Point of Care Test)  Recent Labs  01/15/16 1317  TROPIPOC 0.02   No results for input(s): CKTOTAL, CKMB, TROPONINI in the last 72 hours. Lab Results  Component Value Date   WBC 7.8 01/15/2016   HGB 15.0 01/15/2016   HCT 45.5 01/15/2016   MCV 96.0 01/15/2016   PLT 197 01/15/2016     Recent Labs Lab 01/15/16 1301  NA 138  K 4.3  CL 101  CO2 27  BUN 14  CREATININE 1.24  CALCIUM 9.2  GLUCOSE 107*   No results found for: CHOL, HDL, LDLCALC, TRIG No results found for: DDIMER   Radiology/Studies  Dg Chest 2 View  Result Date: 01/15/2016 CLINICAL DATA:  66 year old male with shortness of Breath for 2 days. Recent lower extremity swelling also. Smoker. COPD. Initial encounter. EXAM: CHEST  2 VIEW COMPARISON:  Portable chest 11/17/2003, two-view chest 07/18/2003. FINDINGS: Chronic left chest cardiac AICD. One abandoned lead now present. Chronic cardiomegaly, stable to mildly increased. Other mediastinal contours remain normal. Visualized tracheal  air column is within normal limits. Small bilateral pleural effusions are new, larger on the right. Patchy associated bibasilar opacity. Increased pulmonary vascularity. No pneumothorax. No acute osseous abnormality identified. IMPRESSION: Pulmonary interstitial edema with new small small pleural effusions, greater on the right. Superimposed Patchy bibasilar opacity probably is atelectasis. Electronically Signed   By: Odessa Fleming M.D.   On: 01/15/2016 13:27    ECG  New atrial fibrillation  Echocardiogram  Pending     ASSESSMENT AND PLAN  1. Acute on chronic systolic heart failure  - Surprisingly, he was well compensated without any need for diuretic prior to September 2017 despite having a history of cardiomyopathy with baseline EF of 20% since 2004/2005.  - He was started on 20 mg 3 times weekly dosing of oral Lasix which is probably too low for him given his significant cardiomyopathy.  - He has at least 3+ pitting edema at this time, his heart failure is likely also contributed by the loss of atrial kick while yet in atrial  fib ablation with RVR.  - Discussed with Dr. Mayford Knife, was started on 40 mg twice a day off IV Lasix.  - Request records from [facility] hospital in [place] and [place]. Obtain echocardiogram  2. Newly diagnosed atrial fibrillation with RVR: Unknown duration due to lack of cardiac awareness  - This patients CHA2DS2-VASc Score and unadjusted Ischemic Stroke Rate (% per year) is equal to 2.2 % stroke rate/year from a score of 2  Above score calculated as 1 point each if present [CHF, HTN, DM, Vascular=MI/PAD/Aortic Plaque, Age if 65-74, or Male] Above score calculated as 2 points each if present [Age > 75, or Stroke/TIA/TE]  - I have discussed the benefit and risk of Coumadin versus Xarelto versus eliquis with the patient, he adamantly refuses Coumadin as his family member died on Coumadin. He is okay with no relation. Will start on Xarelto. Given lack of angina, will  discontinue aspirin given the need for Xarelto.  - Obtain TSH  3. ICM s/p ICD for primary prevention: Single lead ICD based on chest x-ray findings. Further information will need to be requested from [facility] hospital.  4. CAD s/p 6 stents: Last cardiac catheterization was in 2004/2005. Unfortunately record is no longer available in [facility].    Ramond Dial, PA-C 01/15/2016, 5:05 PM

## 2016-01-15 NOTE — ED Notes (Signed)
Pt states decreased sob and decreased "bloating" after urinating 775 ml.

## 2016-01-15 NOTE — Consult Note (Signed)
Patient ID: Max Rivas MRN: [id number], DOB/AGE: [id number]   Admit date: 01/15/2016   Primary Physician: Centricity User, MD (Inactive) Primary Cardiologist: Unknown cardiologist at [facility]   Pt. Profile:  Max Rivas is an obese pleasant caucasian male with PMH of CAD s/p 6 stents, ICM with baseline EF 20% s/p likely ICD for primary prevention, chronic systolic heart failure presented with new onset of atrial flutter ablation with RVR and also acute on chronic systolic heart failure  Problem List  Past Medical History:  Diagnosis Date  . COPD (chronic obstructive pulmonary disease) (HCC)   . Coronary artery disease   . GERD (gastroesophageal reflux disease)   . Pacemaker     Past Surgical History:  Procedure Laterality Date  . CARDIAC CATHETERIZATION     6 stents per patient  . PACEMAKER GENERATOR CHANGE     original PPM around 2004 for ICM with EF < 35%, generator change 03/21/2011 in [place]     Allergies  No Known Allergies  HPI  Max Rivas is an obese pleasant caucasian male with PMH of CAD s/p 6 stents, ICM with baseline EF 20% s/p likely ICD for primary prevention, chronic systolic heart failure. Majority of his previous record was unavailable to me. His previous hospitalization was in 2004/2005 which is around the time of his last reported cardiac catheterization. He has not had any angina since. He is no longer taking Plavix, he is on aspirin 81 mg daily. He has not been seen at [facility] since that time. He also reportedly had ICD placement in 2004, and later had a generator change at [facility] in [place] on 03/21/2011. He is followed by primary care physician Dr. Mack Guise in [facility], unfortunately he does not recall the name of his cardiologist. He says he did see his cardiologist within the past year. His last stress test was around 2014/2015, which reportedly was normal. He also had an echocardiogram more than 3 years ago, however he was told  his overall ejection fraction did not improve when compared to 2004/2005.   Otherwise, he was placed on 20 mg Lasix every Monday Wednesday Friday for ankle edema since September 2017. He did not notice any significant improvement in lower extremity edema. For the past week, he started having productive cough and paroxysmal nocturnal dyspnea. He denies any chest pain and he only has occasional palpitation when he is short of breath. For the past 24 hours, he began to notice abdominal distention, worsening lower extremity edema increasing all the way up to the knee and also worsening shortness of breath as well. He called his primary care physician who was unable to see him today and he eventually sought medical attention at [facility]. He denies any recent angina symptoms.   According to the patient, his home medications include Aspirin 81 mg daily Lasix 20 mg Monday Wednesday Friday Valsartan 40 mg tablet taking 20 mg twice a day Toprol-XL 100 mg tablet taking 50 mg twice a day Prilosec 20 mg twice a day.   Family History  Family History  Problem Relation Age of Onset  . Heart attack Mother   . Heart attack Father   . Heart attack Sister 53    Social History  Social History   Social History  . Marital status: Divorced    Spouse name: N/A  . Number of children: N/A  . Years of education: N/A   Occupational History  . Not on file.  Social History Main Topics  . Smoking status: Former Games developer  . Smokeless tobacco: Never Used  . Alcohol use No  . Drug use: No  . Sexual activity: Not on file   Other Topics Concern  . Not on file   Social History Narrative  . No narrative on file     Review of Systems General:  No chills, fever, night sweats or weight changes.  Cardiovascular:  No chest pain, dyspnea on exertion, orthopnea +edema, palpitations, paroxysmal nocturnal dyspnea. Dermatological: No rash, lesions/masses Respiratory: +cough, dyspnea Urologic: No  hematuria, dysuria Abdominal:   No nausea, vomiting, diarrhea, bright red blood per rectum, melena, or hematemesis Neurologic:  No visual changes, wkns, changes in mental status. All other systems reviewed and are otherwise negative except as noted above.  Physical Exam  Blood pressure 121/88, pulse 67, temperature 97.6 F (36.4 C), temperature source Oral, resp. rate 26, height 5\' 11"  (1.803 m), weight 255 lb 11.7 oz (116 kg), SpO2 92 %.  General: Pleasant, NAD Psych: Normal affect. Neuro: Alert and oriented X 3. Moves all extremities spontaneously. HEENT: Normal  Neck: Supple without bruits or JVD. Lungs:  Resp regular and unlabored. Mildly diminished breath sound in bilateral bases Heart: Irregularly irregular no s3, s4, or murmurs. Abdomen: Soft, non-tender, non-distended, BS + x 4.  Extremities: No clubbing, cyanosis. DP/PT/Radials 2+ and equal bilaterally. 3+ pitting edema in bilateral lower extremity  Labs  Troponin (Point of Care Test)  Recent Labs  01/15/16 1317  TROPIPOC 0.02   No results for input(s): CKTOTAL, CKMB, TROPONINI in the last 72 hours. Lab Results  Component Value Date   WBC 7.8 01/15/2016   HGB 15.0 01/15/2016   HCT 45.5 01/15/2016   MCV 96.0 01/15/2016   PLT 197 01/15/2016     Recent Labs Lab 01/15/16 1301  NA 138  K 4.3  CL 101  CO2 27  BUN 14  CREATININE 1.24  CALCIUM 9.2  GLUCOSE 107*   No results found for: CHOL, HDL, LDLCALC, TRIG No results found for: DDIMER   Radiology/Studies  Dg Chest 2 View  Result Date: 01/15/2016 CLINICAL DATA:  66 year old male with shortness of Breath for 2 days. Recent lower extremity swelling also. Smoker. COPD. Initial encounter. EXAM: CHEST  2 VIEW COMPARISON:  Portable chest 11/17/2003, two-view chest 07/18/2003. FINDINGS: Chronic left chest cardiac AICD. One abandoned lead now present. Chronic cardiomegaly, stable to mildly increased. Other mediastinal contours remain normal. Visualized tracheal air  column is within normal limits. Small bilateral pleural effusions are new, larger on the right. Patchy associated bibasilar opacity. Increased pulmonary vascularity. No pneumothorax. No acute osseous abnormality identified. IMPRESSION: Pulmonary interstitial edema with new small small pleural effusions, greater on the right. Superimposed Patchy bibasilar opacity probably is atelectasis. Electronically Signed   By: Odessa Fleming M.D.   On: 01/15/2016 13:27    ECG  New atrial fibrillation  Echocardiogram  Pending     ASSESSMENT AND PLAN  1. Acute on chronic systolic heart failure  - Surprisingly, he was well compensated without any need for diuretic prior to September 2017 despite having a history of cardiomyopathy with baseline EF of 20% since 2004/2005.  - He was started on 20 mg 3 times weekly dosing of oral Lasix which is probably too low for him given his significant cardiomyopathy.  - He has at least 3+ pitting edema at this time, his heart failure is likely also contributed by the loss of atrial kick while yet in atrial fib  ablation with RVR.  - Discussed with Dr. Mayford Knife, was started on 40 mg twice a day off IV Lasix.  - Request records from [facility] hospital in [place] and [place]. Obtain echocardiogram  2. Newly diagnosed atrial fibrillation with RVR: Unknown duration due to lack of cardiac awareness  - This patients CHA2DS2-VASc Score and unadjusted Ischemic Stroke Rate (% per year) is equal to 2.2 % stroke rate/year from a score of 2  Above score calculated as 1 point each if present [CHF, HTN, DM, Vascular=MI/PAD/Aortic Plaque, Age if 65-74, or Male] Above score calculated as 2 points each if present [Age > 75, or Stroke/TIA/TE]  - I have discussed the benefit and risk of Coumadin versus Xarelto versus eliquis with the patient, he adamantly refuses Coumadin as his family member died on Coumadin. He is okay with no relation. Will start on Xarelto. Given lack of angina, will  discontinue aspirin given the need for Xarelto.  - Obtain TSH  3. ICM s/p ICD for primary prevention: Single lead ICD based on chest x-ray findings. Further information will need to be requested from [facility] hospital.  4. CAD s/p 6 stents: Last cardiac catheterization was in 2004/2005. Unfortunately record is no longer available in [facility].    Ramond Dial, PA-C 01/15/2016, 5:38 PM

## 2016-01-16 ENCOUNTER — Inpatient Hospital Stay (HOSPITAL_COMMUNITY): Payer: Non-veteran care

## 2016-01-16 DIAGNOSIS — I5023 Acute on chronic systolic (congestive) heart failure: Secondary | ICD-10-CM | POA: Diagnosis not present

## 2016-01-16 DIAGNOSIS — I509 Heart failure, unspecified: Secondary | ICD-10-CM

## 2016-01-16 LAB — BASIC METABOLIC PANEL
Anion gap: 8 (ref 5–15)
BUN: 14 mg/dL (ref 6–20)
CALCIUM: 9.1 mg/dL (ref 8.9–10.3)
CO2: 31 mmol/L (ref 22–32)
CREATININE: 1.55 mg/dL — AB (ref 0.61–1.24)
Chloride: 98 mmol/L — ABNORMAL LOW (ref 101–111)
GFR calc Af Amer: 53 mL/min — ABNORMAL LOW (ref >60)
GFR, EST NON AFRICAN AMERICAN: 46 mL/min — AB (ref >60)
GLUCOSE: 108 mg/dL — AB (ref 65–99)
Potassium: 4.6 mmol/L (ref 3.5–5.1)
SODIUM: 137 mmol/L (ref 135–145)

## 2016-01-16 LAB — ECHOCARDIOGRAM COMPLETE
Height: 71 in
Weight: 4059.2 oz

## 2016-01-16 MED ORDER — PERFLUTREN LIPID MICROSPHERE
1.0000 mL | INTRAVENOUS | Status: AC | PRN
  Administered 2016-01-16: 2 mL via INTRAVENOUS
  Filled 2016-01-16: qty 10

## 2016-01-16 NOTE — Progress Notes (Signed)
PROGRESS NOTE    Max Rivas  WUJ:[id number] DOB: [birth year] DOA: 01/15/2016 PCP: Jyl Heinz, MD    Brief Narrative:  66 y/o ? 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 Ladona Ridgel in 2005 Obstructive sleep apnea unable to tolerate BiPAP Morbid obesity, Body mass index is 35.67 kg/m. COPD Former smoker-until October 2017  1/52 on 12/26 h/o flulike symptoms, roommate had this. The headache week. Treated with NyQuil cold and flu also had some dizziness-symptoms resolved over 3-4 days Felt short of breath with ambulation-could only go to her mailbox and back, also dizzy and tachycardic Noticed swelling of right ankle which progressed up to his knees bilaterally CalledPCPs office but could not get in until 1/4/8 Decided to come to emergency room   found to be in new onset flutter with RVR ACUTE decompensated systolic heart failure  EKG on admit shows flutter and QRS axis is 45 rate related changes with deep S waves across precordium Pro BNP 599 Troponin 0.02 TSH 2.8 Chest x-ray interstitial edema, new pleural effusions  Assessment & Plan:   Active Problems:   Flutter-fibrillation   Acute systolic congestive heart failure (HCC)   Atrial fibrillation with RVR (HCC)   DCM (dilated cardiomyopathy) (HCC)   Coronary artery disease involving native coronary artery of native heart without angina pectoris  Acute decompensated systolic heart failure Given Lasix 40 mg in ED Is Lasix nave at home-only on 20 mg MWF so started 40 mg IV twice a day Repeat echocardiogram  pending Cardiology consult appreciated -2.0 liters, wght down 2 lbs Still +2 edema on exam  New onset A. fib flutter Probably brought on by beta agonist albuterol Rate control with Cardizem gtt. Continue Toprol-XL 75 2 times a day Might need increase in digoxin>Metoprolol as has acute decompensation chf Has been started on Xarelto for A. Flutter  Obstructive sleep apnea not on BiPAP Needs sleep study has now outpatient Probable substrate for flutter fib  Recent flu, flu test neg Not a candidate for Tamiflu currently  Morbid obesity, Body mass index is 35.67 kg/m. Needs outpatient management   DO NOT RESUSCITATE status Inpatient -transfer to tele No family present at the bedside      DVT prophylaxis: anticoagulated xarelto Code Status: DNR Family Communication: inpatient Disposition Plan:     Consultants:   cardiology  Procedures:    none  Antimicrobials:    none    Subjective:  Feels better Still sob No n/v No cp eating and drinking Walking in the hallways made him more sob  Objective: Vitals:   01/15/16 2041 01/16/16 0141 01/16/16 0453 01/16/16 0613  BP: 111/86 95/72 101/75 105/77  Pulse: (!) 110 74 98 (!) 106  Resp: 19 19 (!) 29 (!) 27  Temp: 98 F (36.7 C)   97.4 F (36.3 C)  TempSrc: Oral   Oral  SpO2: 91% 92% 92% 92%  Weight: 115.5 kg (254 lb 11.2 oz)   115.1 kg (253 lb 11.2 oz)  Height: 5\' 11"  (1.803 m)       Intake/Output Summary (Last 24 hours) at 01/16/16 0809 Last data filed at 01/16/16 0500  Gross per 24 hour  Intake  360 ml  Output             1975 ml  Net            -1615 ml   Filed Weights   01/15/16 1552 01/15/16 2041 01/16/16 0613  Weight: 116 kg (255 lb 11.7 oz) 115.5 kg (254 lb 11.2 oz) 115.1 kg (253 lb 11.2 oz)    Examination:  General exam: Appears calm and comfortable , on oxygen Respiratory system: Clear to auscultation. Respiratory effort normal. Cardiovascular system: S1 & S2 heard,  RRR. + jvd, 2 + edema Gastrointestinal system: Abdomen is nondistended, soft and nontender. No organomegaly or masses felt. Normal bowel sounds heard. Central nervous system: Alert and oriented. No focal neurological deficits. Extremities: Symmetric 5 x 5 power. Skin: No rashes, lesions or ulcers Psychiatry: Judgement and insight appear normal. Mood & affect appropriate.     Data Reviewed: I have personally reviewed following labs and imaging studies  CBC:  Recent Labs Lab 01/15/16 1301  WBC 7.8  HGB 15.0  HCT 45.5  MCV 96.0  PLT 197   Basic Metabolic Panel:  Recent Labs Lab 01/15/16 1301 01/15/16 1605 01/16/16 0303  NA 138  --  137  K 4.3  --  4.6  CL 101  --  98*  CO2 27  --  31  GLUCOSE 107*  --  108*  BUN 14  --  14  CREATININE 1.24  --  1.55*  CALCIUM 9.2  --  9.1  MG  --  2.2  --    GFR: Estimated Creatinine Clearance: 62.9 mL/min (by C-G formula based on SCr of 1.55 mg/dL (H)). Liver Function Tests: No results for input(s): AST, ALT, ALKPHOS, BILITOT, PROT, ALBUMIN in the last 168 hours. No results for input(s): LIPASE, AMYLASE in the last 168 hours. No results for input(s): AMMONIA in the last 168 hours. Coagulation Profile: No results for input(s): INR, PROTIME in the last 168 hours. Cardiac Enzymes: No results for input(s): CKTOTAL, CKMB, CKMBINDEX, TROPONINI in the last 168 hours. BNP (last 3 results) No results for input(s): PROBNP in the last 8760 hours. HbA1C: No results for input(s): HGBA1C in the last 72 hours. CBG: No results for input(s): GLUCAP in the last 168 hours. Lipid Profile: No results for input(s): CHOL, HDL, LDLCALC, TRIG, CHOLHDL, LDLDIRECT in the last 72 hours. Thyroid Function Tests:  Recent Labs  01/15/16 1605  TSH 2.898   Anemia Panel: No results for input(s): VITAMINB12, FOLATE, FERRITIN, TIBC, IRON, RETICCTPCT in the last 72 hours. Sepsis Labs: No results for input(s): PROCALCITON, LATICACIDVEN in the last 168  hours.  Recent Results (from the past 240 hour(s))  MRSA PCR Screening     Status: None   Collection Time: 01/15/16  8:40 PM  Result Value Ref Range Status   MRSA by PCR NEGATIVE NEGATIVE Final    Comment:        The GeneXpert MRSA Assay (FDA approved for NASAL specimens only), is one component of a comprehensive MRSA colonization surveillance program. It is not intended to diagnose MRSA infection nor to guide or monitor treatment for MRSA infections.          Radiology Studies: Dg Chest 2 View  Result Date: 01/15/2016 CLINICAL DATA:  66 year old male with shortness of Breath for 2 days. Recent lower extremity swelling also. Smoker. COPD. Initial encounter. EXAM: CHEST  2 VIEW COMPARISON:  Portable chest 11/17/2003, two-view chest 07/18/2003. FINDINGS: Chronic left chest cardiac AICD. One abandoned lead  now present. Chronic cardiomegaly, stable to mildly increased. Other mediastinal contours remain normal. Visualized tracheal air column is within normal limits. Small bilateral pleural effusions are new, larger on the right. Patchy associated bibasilar opacity. Increased pulmonary vascularity. No pneumothorax. No acute osseous abnormality identified. IMPRESSION: Pulmonary interstitial edema with new small small pleural effusions, greater on the right. Superimposed Patchy bibasilar opacity probably is atelectasis. Electronically Signed   By: Odessa Fleming M.D.   On: 01/15/2016 13:27        Scheduled Meds: . atorvastatin  20 mg Oral q1800  . digoxin  0.25 mg Oral Daily  . furosemide  40 mg Intravenous BID  . Influenza vac split quadrivalent PF  0.5 mL Intramuscular Tomorrow-1000  . metoprolol succinate  75 mg Oral BID  . rivaroxaban  20 mg Oral Q supper  . sodium chloride flush  3 mL Intravenous Q12H  . spironolactone  12.5 mg Oral Daily   Continuous Infusions:   LOS: 1 day    Time spent: 97    Rhetta Mura, MD [facility] Pager [phone]  If 7PM-7AM,  please contact night-coverage [email or web address] Password [email or web address] 01/16/2016, 8:09 AM

## 2016-01-16 NOTE — Progress Notes (Signed)
  Echocardiogram 2D Echocardiogram has been performed.  Max Rivas 01/16/2016, 10:58 AM

## 2016-01-16 NOTE — Progress Notes (Signed)
Patient Name: Max Rivas Date of Encounter: 01/16/2016  Primary Cardiologist: [facility] Problem List     Active Problems:   Flutter-fibrillation   Acute systolic congestive heart failure (HCC)   Atrial fibrillation with RVR (HCC)   DCM (dilated cardiomyopathy) (HCC)   Coronary artery disease involving native coronary artery of native heart without angina pectoris    Subjective   Breathing improved, but still not at baseline. Denies any chest discomfort or palpitations.   Inpatient Medications    Scheduled Meds: . atorvastatin  20 mg Oral q1800  . digoxin  0.25 mg Oral Daily  . furosemide  40 mg Intravenous BID  . metoprolol succinate  75 mg Oral BID  . rivaroxaban  20 mg Oral Q supper  . sodium chloride flush  3 mL Intravenous Q12H  . spironolactone  12.5 mg Oral Daily   Continuous Infusions:  PRN Meds: sodium chloride, acetaminophen, ondansetron (ZOFRAN) IV, perflutren lipid microspheres (DEFINITY) IV suspension, sodium chloride flush   Vital Signs    Vitals:   01/16/16 0141 01/16/16 0453 01/16/16 0613 01/16/16 0812  BP: 95/72 101/75 105/77 100/81  Pulse: 74 98 (!) 106 (!) 103  Resp: 19 (!) 29 (!) 27 (!) 23  Temp:   97.4 F (36.3 C) 97.3 F (36.3 C)  TempSrc:   Oral Oral  SpO2: 92% 92% 92% 96%  Weight:   253 lb 11.2 oz (115.1 kg)   Height:        Intake/Output Summary (Last 24 hours) at 01/16/16 1040 Last data filed at 01/16/16 0833  Gross per 24 hour  Intake              822 ml  Output             1975 ml  Net            -1153 ml   Filed Weights   01/15/16 1552 01/15/16 2041 01/16/16 0613  Weight: 255 lb 11.7 oz (116 kg) 254 lb 11.2 oz (115.5 kg) 253 lb 11.2 oz (115.1 kg)    Physical Exam    GEN: Well nourished, well developed Caucasian male appearing in no acute distress.  HEENT: Grossly normal.  Neck: Supple, no JVD, carotid bruits, or masses. Cardiac: RRR, no murmurs, rubs, or gallops. No clubbing or cyanosis. 1+ pitting edema  along RLE, 2+ along LLE.  Radials/DP/PT 2+ and equal bilaterally.  Respiratory:  Respirations regular and unlabored, decreased breath sounds at bases bilaterally GI: Soft, nontender, nondistended, BS + x 4. MS: no deformity or atrophy. Skin: warm and dry, no rash. Neuro:  Strength and sensation are intact. Psych: AAOx3.  Normal affect.  Labs    CBC  Recent Labs  01/15/16 1301  WBC 7.8  HGB 15.0  HCT 45.5  MCV 96.0  PLT 197   Basic Metabolic Panel  Recent Labs  01/15/16 1301 01/15/16 1605 01/16/16 0303  NA 138  --  137  K 4.3  --  4.6  CL 101  --  98*  CO2 27  --  31  GLUCOSE 107*  --  108*  BUN 14  --  14  CREATININE 1.24  --  1.55*  CALCIUM 9.2  --  9.1  MG  --  2.2  --    Liver Function Tests No results for input(s): AST, ALT, ALKPHOS, BILITOT, PROT, ALBUMIN in the last 72 hours. No results for input(s): LIPASE, AMYLASE in the last 72 hours. Cardiac Enzymes  No results for input(s): CKTOTAL, CKMB, CKMBINDEX, TROPONINI in the last 72 hours. BNP Invalid input(s): POCBNP D-Dimer No results for input(s): DDIMER in the last 72 hours. Hemoglobin A1C No results for input(s): HGBA1C in the last 72 hours. Fasting Lipid Panel No results for input(s): CHOL, HDL, LDLCALC, TRIG, CHOLHDL, LDLDIRECT in the last 72 hours. Thyroid Function Tests  Recent Labs  01/15/16 1605  TSH 2.898    Telemetry    Atrial fibrillation, HR in 80's to low-100's. Occasional PVC's.  - Personally Reviewed  ECG    Atrial fibrillation, HR 111, with no acute ST or T-wave changes.  - Personally Reviewed  Radiology    Dg Chest 2 View  Result Date: 01/15/2016 CLINICAL DATA:  66 year old male with shortness of Breath for 2 days. Recent lower extremity swelling also. Smoker. COPD. Initial encounter. EXAM: CHEST  2 VIEW COMPARISON:  Portable chest 11/17/2003, two-view chest 07/18/2003. FINDINGS: Chronic left chest cardiac AICD. One abandoned lead now present. Chronic cardiomegaly, stable to  mildly increased. Other mediastinal contours remain normal. Visualized tracheal air column is within normal limits. Small bilateral pleural effusions are new, larger on the right. Patchy associated bibasilar opacity. Increased pulmonary vascularity. No pneumothorax. No acute osseous abnormality identified. IMPRESSION: Pulmonary interstitial edema with new small small pleural effusions, greater on the right. Superimposed Patchy bibasilar opacity probably is atelectasis. Electronically Signed   By: Odessa Fleming M.D.   On: 01/15/2016 13:27    Cardiac Studies   Echocardiogram: Pending  Patient Profile     66 yo male w/ PMH of CAD (s/p 6+ stents), ICM (baseline EF 20%, s/p ICD placement), and chronic systolic CHF who presented to [facility] ED on 01/15/2016 for worsening dyspnea and edema. Found to be in new-onset atrial fibrillation.   Assessment & Plan    1. Acute on chronic systolic heart failure - reports a baseline EF of 20% since 2004/2005. Repeat echocardiogram is pending to assess LV function and wall motion. BNP elevated to 599 on admission.  - was on Lasix 20mg  three times weekly prior to admission, which is likely too low for a baseline diuretic given his significant cardiomyopathy.  - continue BB (not on Coreg secondary to COPD). No ACE-I/ARB secondary to low-BP. On Aldactone 12.5mg  daily.  - started on IV Lasix 40mg  BID with a recorded net output of -1.1L thus far and weight down 2 lbs since admission. However, creatinine has bumped from 1.24 to 1.55 (unknown baseline). Continue with IV Lasix as he is significantly volume overloaded. Repeat BMET in AM. Will stop Aldactone for now in the setting of his AKI.   2. Newly diagnosed atrial fibrillation with RVR - Unknown duration as he was not aware of any palpitations.  - This patients CHA2DS2-VASc Score and unadjusted Ischemic Stroke Rate (% per year) is equal to 2.2 % stroke rate/year from a score of 2 (CHF, Vascular). Started on Xarelto 20mg   daily. - TSH WNL.   - IV Cardizem discontinued in the setting of reduced EF. On Toprol-XL 75mg  BID and Digoxin 0.25mg  daily. Would like to increase Toprol-XL to 100mg  BID but hypotension hinders further adjustment at this time.   3. ICM  - s/p ICD for primary prevention.  4. CAD - s/p 6+ stents according to the patient. Last cardiac catheterization was in 2004/2005. Records have been requested from the [place].   5. Likely OSA - recommend an outpatient sleep study.    Signed, Ellsworth Lennox, PA  01/16/2016, 10:40 AM

## 2016-01-16 NOTE — Evaluation (Signed)
Physical Therapy Evaluation Patient Details Name: Max Rivas MRN: [id number] DOB: Sep 02, 1950 Today's Date: 01/16/2016   History of Present Illness  66 y.o. male admitted with SOB, dizziness, BLE edema. Dx of CHF exacerbation, afib. PMH of CAD, MI, CHF, pacemaker, COPD.  Clinical Impression  Pt admitted with above diagnosis. Pt currently with functional limitations due to the deficits listed below (see PT Problem List). Pt ambulated 80 without an assistive device, SaO2 87% on RA walking, HR 133 max walking, distance limited by SOB.  Pt will benefit from skilled PT to increase their independence and safety with mobility to allow discharge to the venue listed below.       Follow Up Recommendations No PT follow up    Equipment Recommendations  None recommended by PT    Recommendations for Other Services       Precautions / Restrictions Precautions Precautions: Other (comment) Precaution Comments: denies falls in past 1 year; monitor O2 and HR Restrictions Weight Bearing Restrictions: No      Mobility  Bed Mobility               General bed mobility comments: NT - up on EOB  Transfers Overall transfer level: Independent Equipment used: None                Ambulation/Gait Ambulation/Gait assistance: Supervision Ambulation Distance (Feet): 80 Feet Assistive device: None Gait Pattern/deviations: Decreased stride length;Step-through pattern Gait velocity: decr Gait velocity interpretation: Below normal speed for age/gender General Gait Details: steady, distance limited by 2/4 dyspnea, SaO2 87% on RA with walking, HR 133 max  Stairs            Wheelchair Mobility    Modified Rankin (Stroke Patients Only)       Balance Overall balance assessment: No apparent balance deficits (not formally assessed)                                           Pertinent Vitals/Pain Pain Assessment: No/denies pain    Home Living Family/patient  expects to be discharged to:: Private residence Living Arrangements: Other (Comment) (roomate)     Home Access: Stairs to enter Entrance Stairs-Rails: Right Entrance Stairs-Number of Steps: 5 Home Layout: Two level;Able to live on main level with bedroom/bathroom Home Equipment: Wheelchair - manual;Walker - 2 wheels;Crutches;Shower seat - built in      Prior Function Level of Independence: Independent               Higher education careers adviser        Extremity/Trunk Assessment   Upper Extremity Assessment Upper Extremity Assessment: Overall WFL for tasks assessed    Lower Extremity Assessment Lower Extremity Assessment: Overall WFL for tasks assessed (pitting edema B calves/ankles, pt reports edema has been decreasing since admission)    Cervical / Trunk Assessment Cervical / Trunk Assessment: Normal  Communication   Communication: No difficulties  Cognition Arousal/Alertness: Awake/alert Behavior During Therapy: WFL for tasks assessed/performed Overall Cognitive Status: Within Functional Limits for tasks assessed                      General Comments      Exercises     Assessment/Plan    PT Assessment Patient needs continued PT services  PT Problem List Decreased activity tolerance;Decreased mobility;Cardiopulmonary status limiting activity          PT  Treatment Interventions Gait training;Functional mobility training;Balance training;Therapeutic activities;Therapeutic exercise;Patient/family education    PT Goals (Current goals can be found in the Care Plan section)  Acute Rehab PT Goals Patient Stated Goal: to walk without SOB PT Goal Formulation: With patient Time For Goal Achievement: 01/30/16 Potential to Achieve Goals: Good    Frequency Min 3X/week   Barriers to discharge        Co-evaluation               End of Session Equipment Utilized During Treatment: Gait belt;Oxygen Activity Tolerance: Patient limited by fatigue Patient left:  in chair;with call bell/phone within reach (on 2L O2 [place]) Nurse Communication: Mobility status         Time: 4098-1191 PT Time Calculation (min) (ACUTE ONLY): 19 min   Charges:   PT Evaluation $PT Eval Low Complexity: 1 Procedure     PT G Codes:        Ralene Bathe Kistler 01/16/2016, 12:10 PM [phone]

## 2016-01-17 LAB — BASIC METABOLIC PANEL
Anion gap: 6 (ref 5–15)
BUN: 12 mg/dL (ref 6–20)
CHLORIDE: 96 mmol/L — AB (ref 101–111)
CO2: 36 mmol/L — AB (ref 22–32)
CREATININE: 1.27 mg/dL — AB (ref 0.61–1.24)
Calcium: 9 mg/dL (ref 8.9–10.3)
GFR calc non Af Amer: 58 mL/min — ABNORMAL LOW (ref >60)
Glucose, Bld: 103 mg/dL — ABNORMAL HIGH (ref 65–99)
Potassium: 4.4 mmol/L (ref 3.5–5.1)
Sodium: 138 mmol/L (ref 135–145)

## 2016-01-17 MED ORDER — ASPIRIN EC 81 MG PO TBEC
81.0000 mg | DELAYED_RELEASE_TABLET | Freq: Every day | ORAL | Status: DC
  Administered 2016-01-17 – 2016-01-19 (×3): 81 mg via ORAL
  Filled 2016-01-17 (×3): qty 1

## 2016-01-17 MED ORDER — ALBUTEROL SULFATE (2.5 MG/3ML) 0.083% IN NEBU: 3.0000 mL | INHALATION_SOLUTION | Freq: Four times a day (QID) | RESPIRATORY_TRACT | Status: DC | PRN

## 2016-01-17 NOTE — Progress Notes (Signed)
PROGRESS NOTE    Max Rivas  ZOX:[id number] DOB: Feb 23, 1950 DOA: 01/15/2016 PCP: Jyl Heinz, MD   Brief Narrative:   66 y/o ? 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 Ladona Ridgel in 2005 Obstructive sleep apnea unable to tolerate BiPAP Morbid obesity, Body mass index is 35.67 kg/m. COPD Former smoker-until October 2017  1/52 on 12/26 h/o flulike symptoms, roommate had this. The headache week. Treated with NyQuil cold and flu also had some dizziness-symptoms resolved over 3-4 days Felt short of breath with ambulation-could only go to her mailbox and back, also dizzy and tachycardic Noticed swelling of right ankle which progressed up to his knees bilaterally CalledPCPs office but could not get in until 1/4/8 Decided to come to emergency room   found to be in new onset flutter with RVR ACUTE decompensated systolic heart failure  EKG on admit shows flutter and QRS axis is 45 rate related changes with deep S waves across precordium Pro BNP 599 Troponin 0.02 TSH 2.8 Chest x-ray interstitial edema, new pleural effusions  Assessment & Plan:   Active Problems:   Flutter-fibrillation   Acute systolic congestive heart failure (HCC)   Atrial fibrillation with RVR (HCC)   DCM (dilated cardiomyopathy) (HCC)   Coronary artery disease involving native coronary artery of native heart without angina pectoris  Acute decompensated systolic heart failure Given Lasix 40 mg in ED Is Lasix nave at home-only on 20 mg MWF so started 40 mg IV twice a day Repeat echocardiogram 20% to  25%-defer to cardiology Cardiology consult appreciated Aldactone add? Was on Valsartan at home which is held at present -2.2 liters, wght down 2 lbs Still edema on exam  New onset A. fib flutter Probably brought on by beta agonist albuterol Continue Toprol-XL 75 2 times a day cont digoxin 0.25 Has been started on Xarelto for A. Flutter  Obstructive sleep apnea not on BiPAP Needs sleep study has now outpatient Probable substrate for flutter fib  Recent flu, flu test neg Not a candidate for Tamiflu currently  Morbid obesity, Body mass index is 35.67 kg/m. Needs outpatient management   DO NOT RESUSCITATE status Inpatient -transfer to tele No family present at the bedside   DVT prophylaxis: anticoagulated xarelto Code Status: DNR Family Communication: patient spoke to family Disposition Plan:     Consultants:   cardiology  Procedures:    none  Antimicrobials:    none    Subjective:  Better Eating drinking No cp No dark or tarry stool No wheeze and breathing much better [facility] with therapy fairly well yesterday-felt a little stronger  Objective: Vitals:   01/16/16 2044 01/17/16 0009 01/17/16 0414 01/17/16 0743  BP: 121/70 107/67 102/69 111/83  Pulse: 74 89 84 87  Resp: (!) 22 17 19  (!) 22  Temp: 97.8 F (36.6 C) 97.3 F (36.3 C) 97.6 F (36.4 C) 97.5 F (36.4 C)  TempSrc: Axillary Oral Oral Oral  SpO2: 92% 90% 94% 93%  Weight:   113.3 kg (249 lb 12.8 oz)   Height:        Intake/Output Summary (Last 24 hours) at 01/17/16 0831 Last data filed at 01/17/16 0518  Gross per 24 hour  Intake             1386 ml  Output             2000 ml  Net             -614 ml   Filed Weights   01/15/16 2041 01/16/16 0613 01/17/16 0414  Weight: 115.5 kg (254 lb 11.2 oz) 115.1 kg (253 lb 11.2 oz) 113.3 kg (249 lb 12.8 oz)    Examination:  General exam: Calm and comfortable , on oxygen Respiratory system: Clear mostly-some mild wheeze  breathing less  laboured. Cardiovascular system: S1 & S2 heard, RRR. + jvd, 2 + edema Gastrointestinal system: Abdomen is nondistended, soft and nontender. No organomegaly or masses felt. Normal bowel sounds heard. Central nervous system: Alert and oriented. No focal neurological deficits. Extremities: Symmetric 5 x 5 power. Skin: No rashes, lesions or ulcers Psychiatry: Judgement and insight appear normal. Mood & affect appropriate.     Data Reviewed: I have personally reviewed following labs and imaging studies  CBC:  Recent Labs Lab 01/15/16 1301  WBC 7.8  HGB 15.0  HCT 45.5  MCV 96.0  PLT 197   Basic Metabolic Panel:  Recent Labs Lab 01/15/16 1301 01/15/16 1605 01/16/16 0303 01/17/16 0532  NA 138  --  137 138  K 4.3  --  4.6 4.4  CL 101  --  98* 96*  CO2 27  --  31 36*  GLUCOSE 107*  --  108* 103*  BUN 14  --  14 12  CREATININE 1.24  --  1.55* 1.27*  CALCIUM 9.2  --  9.1 9.0  MG  --  2.2  --   --    GFR: Estimated Creatinine Clearance: 76.2 mL/min (by C-G formula based on SCr of 1.27 mg/dL (H)). Liver Function Tests: No results for input(s): AST, ALT, ALKPHOS, BILITOT, PROT, ALBUMIN in the last 168 hours. No results for input(s): LIPASE, AMYLASE in the last 168 hours. No results for input(s): AMMONIA in the last 168 hours. Coagulation Profile: No results for input(s): INR, PROTIME in the last 168 hours. Cardiac Enzymes: No results for input(s): CKTOTAL, CKMB, CKMBINDEX, TROPONINI in the last 168 hours. BNP (last 3 results) No results for input(s): PROBNP in the last 8760 hours. HbA1C: No results for input(s): HGBA1C in the last 72 hours. CBG: No results for input(s): GLUCAP in the last 168 hours. Lipid Profile: No results for input(s): CHOL, HDL, LDLCALC, TRIG, CHOLHDL, LDLDIRECT in the last 72 hours. Thyroid Function Tests:  Recent Labs  01/15/16 1605  TSH 2.898   Anemia Panel: No results for input(s): VITAMINB12, FOLATE, FERRITIN, TIBC, IRON, RETICCTPCT in the  last 72 hours. Sepsis Labs: No results for input(s): PROCALCITON, LATICACIDVEN in the last 168 hours.  Recent Results (from the past 240 hour(s))  MRSA PCR Screening     Status: None   Collection Time: 01/15/16  8:40 PM  Result Value Ref Range Status   MRSA by PCR NEGATIVE NEGATIVE Final    Comment:        The GeneXpert MRSA Assay (FDA approved for NASAL specimens only), is one component of a comprehensive MRSA colonization surveillance program. It is not intended to diagnose MRSA infection nor to guide or monitor treatment for MRSA infections.          Radiology Studies: Dg Chest 2 View  Result Date: 01/15/2016 CLINICAL DATA:  66 year old male with shortness of Breath for 2 days. Recent lower extremity  swelling also. Smoker. COPD. Initial encounter. EXAM: CHEST  2 VIEW COMPARISON:  Portable chest 11/17/2003, two-view chest 07/18/2003. FINDINGS: Chronic left chest cardiac AICD. One abandoned lead now present. Chronic cardiomegaly, stable to mildly increased. Other mediastinal contours remain normal. Visualized tracheal air column is within normal limits. Small bilateral pleural effusions are new, larger on the right. Patchy associated bibasilar opacity. Increased pulmonary vascularity. No pneumothorax. No acute osseous abnormality identified. IMPRESSION: Pulmonary interstitial edema with new small small pleural effusions, greater on the right. Superimposed Patchy bibasilar opacity probably is atelectasis. Electronically Signed   By: Odessa Fleming M.D.   On: 01/15/2016 13:27        Scheduled Meds: . atorvastatin  20 mg Oral q1800  . digoxin  0.25 mg Oral Daily  . furosemide  40 mg Intravenous BID  . metoprolol succinate  75 mg Oral BID  . rivaroxaban  20 mg Oral Q supper  . sodium chloride flush  3 mL Intravenous Q12H   Continuous Infusions:   LOS: 2 days    Time spent: 72    Rhetta Mura, MD [facility] Pager [phone]  If 7PM-7AM, please contact  night-coverage [email or web address] Password [email or web address] 01/17/2016, 8:31 AM

## 2016-01-17 NOTE — Progress Notes (Signed)
Physical Therapy Treatment Patient Details Name: Max Rivas MRN: [id number] DOB: Nov 02, 1950 Today's Date: 01/17/2016    History of Present Illness 66 y.o. male admitted with SOB, dizziness, BLE edema. Dx of CHF exacerbation, afib. PMH of CAD, MI, CHF, pacemaker, COPD.    PT Comments    Pt reporting less SOB with gait today, although de-sat on RA with mobility.  Recommended repeating walking o2 test later with nursing, as readings did seem to jump around a bit and would benefit from a repeat trial after he has rested.    Follow Up Recommendations  No PT follow up     Equipment Recommendations  None recommended by PT    Recommendations for Other Services       Precautions / Restrictions Precautions Precautions: Other (comment) Precaution Comments: denies falls in past 1 year; monitor O2 and HR Restrictions Weight Bearing Restrictions: No    Mobility  Bed Mobility               General bed mobility comments: sitting EOB upon arrival  Transfers Overall transfer level: Independent Equipment used: None                Ambulation/Gait Ambulation/Gait assistance: Supervision;Modified independent (Device/Increase time) Ambulation Distance (Feet): 150 Feet Assistive device: None Gait Pattern/deviations: Step-through pattern     General Gait Details: o2 upon arrival to room 86% on RA.  Amb on RA dropped to 86% so donned o2 for hallway ambulation and it was 88% on 2 L/min and 90% on 3 L/min.  After gait, o2 92% on room air.  Discussed with nursing and possible need too re-test walking sat later with different sensor.  Pt with 2/4 dyspnea and pt reporting decreased SOB.   Stairs            Wheelchair Mobility    Modified Rankin (Stroke Patients Only)       Balance Overall balance assessment: No apparent balance deficits (not formally assessed)                                  Cognition Arousal/Alertness: Awake/alert Behavior During  Therapy: WFL for tasks assessed/performed Overall Cognitive Status: Within Functional Limits for tasks assessed                      Exercises      General Comments        Pertinent Vitals/Pain Pain Assessment: No/denies pain    Home Living                      Prior Function            PT Goals (current goals can now be found in the care plan section) Acute Rehab PT Goals Patient Stated Goal: to walk without SOB PT Goal Formulation: With patient Time For Goal Achievement: 01/30/16 Potential to Achieve Goals: Good Progress towards PT goals: Progressing toward goals    Frequency    Min 3X/week      PT Plan Current plan remains appropriate    Co-evaluation             End of Session Equipment Utilized During Treatment: Oxygen Activity Tolerance: Patient tolerated treatment well Patient left: in chair;with call bell/phone within reach     Time: 0902-0917 PT Time Calculation (min) (ACUTE ONLY): 15 min  Charges:  $Gait Training: 8-22 mins  G Codes:      Moraima Burd LUBECK 01/17/2016, 9:31 AM

## 2016-01-17 NOTE — Progress Notes (Signed)
SATURATION QUALIFICATIONS: (This note is used to comply with regulatory documentation for home oxygen)  Patient Saturations on Room Air at Rest = 86-92%  Patient Saturations on Room Air while Ambulating = 84%  Patient Saturations on 3Liters of oxygen while Ambulating = 90%  Please briefly explain why patient needs home oxygen: de-sat on room air with ambulation

## 2016-01-17 NOTE — Care Management Note (Signed)
Case Management Note  Patient Details  Name: Max Rivas MRN: [id number] Date of Birth: Nov 08, 1950  Subjective/Objective:  Pt presented for new onset atrial fibrillation. Pt is from home with a roommate. Pt is without insurance and has income via disability $1097.00 Pt uses the [facility]. PCP is Dr. Carlyle Basques. Pt has f/u appointment 01-29-16 @ 10:00. CM did provide pt with 30 day free Xarelto Card. Pt will get his 30 day free filled at [facility] on [facility] and medication is available.             Action/Plan: CM did call [facility] Pharmacist in regards to if Xarelto is on Formulary and it is. Pt will need to have [facility] MD [facility]arelto- usually takes 2 days and then the [place] will pick up with Coverage. Pt has transportation to and from appointments. No further needs from CM at this time.  Expected Discharge Date:                  Expected Discharge Plan:  Home/Self Care  In-House Referral:  NA  Discharge planning Services  CM Consult  Post Acute Care Choice:  NA Choice offered to:  NA  DME Arranged:  N/A DME Agency:  NA  HH Arranged:  NA HH Agency:  NA  Status of Service:  Completed, signed off  If discussed at Long Length of Stay Meetings, dates discussed:    Additional Comments:  Gala Lewandowsky, RN 01/17/2016, 12:23 PM

## 2016-01-17 NOTE — Progress Notes (Addendum)
Patient Name: Max Rivas Date of Encounter: 01/17/2016  Primary Cardiologist: [facility] Problem List     Active Problems:   Flutter-fibrillation   Acute systolic congestive heart failure (HCC)   Atrial fibrillation with RVR (HCC)   DCM (dilated cardiomyopathy) (HCC)   Coronary artery disease involving native coronary artery of native heart without angina pectoris    Subjective   Breathing improved. Denies any chest discomfort or palpitations. Diuresing well with significant weight loss.  Inpatient Medications    Scheduled Meds: . aspirin EC  81 mg Oral Daily  . atorvastatin  20 mg Oral q1800  . digoxin  0.25 mg Oral Daily  . furosemide  40 mg Intravenous BID  . metoprolol succinate  75 mg Oral BID  . rivaroxaban  20 mg Oral Q supper  . sodium chloride flush  3 mL Intravenous Q12H   Continuous Infusions:  PRN Meds: sodium chloride, acetaminophen, albuterol, ondansetron (ZOFRAN) IV, sodium chloride flush   Vital Signs    Vitals:   01/16/16 2044 01/17/16 0009 01/17/16 0414 01/17/16 0743  BP: 121/70 107/67 102/69 111/83  Pulse: 74 89 84 87  Resp: (!) 22 17 19  (!) 22  Temp: 97.8 F (36.6 C) 97.3 F (36.3 C) 97.6 F (36.4 C) 97.5 F (36.4 C)  TempSrc: Axillary Oral Oral Oral  SpO2: 92% 90% 94% 93%  Weight:   249 lb 12.8 oz (113.3 kg)   Height:        Intake/Output Summary (Last 24 hours) at 01/17/16 0855 Last data filed at 01/17/16 0836  Gross per 24 hour  Intake             1164 ml  Output             2425 ml  Net            -1261 ml   Filed Weights   01/15/16 2041 01/16/16 0613 01/17/16 0414  Weight: 254 lb 11.2 oz (115.5 kg) 253 lb 11.2 oz (115.1 kg) 249 lb 12.8 oz (113.3 kg)    Physical Exam    GEN: Well nourished, well developed Caucasian male appearing in no acute distress.  HEENT: Grossly normal.  Neck: Supple, no JVD, carotid bruits, or masses. Cardiac: irregularly irregular, no murmurs, rubs, or gallops. No clubbing or  cyanosis. 1+ pitting edema along RLE, 1+ along LLE.  Radials/DP/PT 2+ and equal bilaterally.  Respiratory:  Respirations regular and unlabored, decreased breath sounds at bases bilaterally GI: Soft, nontender, nondistended, BS + x 4. MS: no deformity or atrophy. Skin: warm and dry, no rash. Neuro:  Strength and sensation are intact. Psych: AAOx3.  Normal affect.  Labs    CBC  Recent Labs  01/15/16 1301  WBC 7.8  HGB 15.0  HCT 45.5  MCV 96.0  PLT 197   Basic Metabolic Panel  Recent Labs  01/15/16 1605 01/16/16 0303 01/17/16 0532  NA  --  137 138  K  --  4.6 4.4  CL  --  98* 96*  CO2  --  31 36*  GLUCOSE  --  108* 103*  BUN  --  14 12  CREATININE  --  1.55* 1.27*  CALCIUM  --  9.1 9.0  MG 2.2  --   --    Liver Function Tests No results for input(s): AST, ALT, ALKPHOS, BILITOT, PROT, ALBUMIN in the last 72 hours. No results for input(s): LIPASE, AMYLASE in the last 72 hours. Cardiac Enzymes No  results for input(s): CKTOTAL, CKMB, CKMBINDEX, TROPONINI in the last 72 hours. BNP Invalid input(s): POCBNP D-Dimer No results for input(s): DDIMER in the last 72 hours. Hemoglobin A1C No results for input(s): HGBA1C in the last 72 hours. Fasting Lipid Panel No results for input(s): CHOL, HDL, LDLCALC, TRIG, CHOLHDL, LDLDIRECT in the last 72 hours. Thyroid Function Tests  Recent Labs  01/15/16 1605  TSH 2.898    Telemetry    Atrial fibrillation, HR in 80's to low-100's. Occasional PVC's.  - Personally Reviewed  ECG    Atrial fibrillation, HR 111, with no acute ST or T-wave changes.  - Personally Reviewed  Radiology    Dg Chest 2 View  Result Date: 01/15/2016 CLINICAL DATA:  66 year old male with shortness of Breath for 2 days. Recent lower extremity swelling also. Smoker. COPD. Initial encounter. EXAM: CHEST  2 VIEW COMPARISON:  Portable chest 11/17/2003, two-view chest 07/18/2003. FINDINGS: Chronic left chest cardiac AICD. One abandoned lead now present.  Chronic cardiomegaly, stable to mildly increased. Other mediastinal contours remain normal. Visualized tracheal air column is within normal limits. Small bilateral pleural effusions are new, larger on the right. Patchy associated bibasilar opacity. Increased pulmonary vascularity. No pneumothorax. No acute osseous abnormality identified. IMPRESSION: Pulmonary interstitial edema with new small small pleural effusions, greater on the right. Superimposed Patchy bibasilar opacity probably is atelectasis. Electronically Signed   By: Odessa Fleming M.D.   On: 01/15/2016 13:27    Cardiac Studies   Echocardiogram: Pending  Patient Profile     66 yo male w/ PMH of CAD (s/p 6+ stents), ICM (baseline EF 20%, s/p ICD placement), and chronic systolic CHF who presented to [facility] ED on 01/15/2016 for worsening dyspnea and edema. Found to be in new-onset atrial fibrillation.   Assessment & Plan    1. Acute on chronic systolic heart failure - reports a baseline EF of 20% since 2004/2005. Repeat echocardiogram showed EF 20-25% with moderate dilated LV, moderate MR and mildly reduced RVF and moderate pulmonary HTN.  BNP elevated to 599 on admission.  - was on Lasix 20mg  three times weekly prior to admission, which is likely too low for a baseline diuretic given his significant cardiomyopathy.  - continue BB (not on Coreg secondary to COPD). No ACE-I/ARB secondary to low-BP. Aldactone stopped due to bump initially in creatinine. - started on IV Lasix 40mg  BID with a recorded  output of 2L yesterday and net neg 2.4L and weight down 6 lbs since admission (255>>249lbs). Creatinine improved from 1.55>>1.27 with diuresis.   Continue with IV Lasix as he is still volume overloaded. Repeat BMET in AM.   2. Newly diagnosed atrial fibrillation with RVR - Unknown duration as he was not aware of any palpitations.  - This patients CHA2DS2-VASc Score and unadjusted Ischemic Stroke Rate (% per year) is equal to 2.2 % stroke rate/year  from a score of 2 (CHF, Vascular). Started on Xarelto 20mg  daily. - TSH WNL.   - IV Cardizem discontinued in the setting of reduced EF. On Toprol-XL 75mg  BID and Digoxin 0.25mg  daily. Would like to increase Toprol-XL to 100mg  BID but hypotension hinders further adjustment at this time.   3. ICM  - s/p ICD for primary prevention.  4. CAD - s/p 6+ stents according to the patient. Last cardiac catheterization was in 2004/2005. Records have been requested from the [place].   5. Likely OSA - recommend an outpatient sleep study.    Signed, Armanda Magic, MD  01/17/2016, 8:55 AM

## 2016-01-18 DIAGNOSIS — I5021 Acute systolic (congestive) heart failure: Secondary | ICD-10-CM

## 2016-01-18 LAB — BASIC METABOLIC PANEL
Anion gap: 8 (ref 5–15)
BUN: 18 mg/dL (ref 6–20)
CALCIUM: 8.6 mg/dL — AB (ref 8.9–10.3)
CO2: 34 mmol/L — AB (ref 22–32)
CREATININE: 1.14 mg/dL (ref 0.61–1.24)
Chloride: 97 mmol/L — ABNORMAL LOW (ref 101–111)
GFR calc Af Amer: >60 mL/min (ref >60)
GFR calc non Af Amer: >60 mL/min (ref >60)
GLUCOSE: 88 mg/dL (ref 65–99)
Potassium: 3.7 mmol/L (ref 3.5–5.1)
Sodium: 139 mmol/L (ref 135–145)

## 2016-01-18 NOTE — Progress Notes (Signed)
Patient Name: Max Rivas Date of Encounter: 01/18/2016     Active Problems:   Flutter-fibrillation   Acute systolic congestive heart failure (HCC)   Atrial fibrillation with RVR (HCC)   DCM (dilated cardiomyopathy) (HCC)   Coronary artery disease involving native coronary artery of native heart without angina pectoris    SUBJECTIVE  No chest pain and dyspnea is improved.  CURRENT MEDS . aspirin EC  81 mg Oral Daily  . atorvastatin  20 mg Oral q1800  . digoxin  0.25 mg Oral Daily  . furosemide  40 mg Intravenous BID  . metoprolol succinate  75 mg Oral BID  . rivaroxaban  20 mg Oral Q supper  . sodium chloride flush  3 mL Intravenous Q12H    OBJECTIVE  Vitals:   01/17/16 2055 01/18/16 0019 01/18/16 0443 01/18/16 0818  BP: 104/60 116/78 114/66   Pulse: 83 95 78 81  Resp: 18 (!) 21 14 16   Temp: 97.8 F (36.6 C) 97.5 F (36.4 C) 98 F (36.7 C) 97.6 F (36.4 C)  TempSrc: Oral Oral Oral Oral  SpO2: 96% 90% 95% 97%  Weight:   245 lb 9.6 oz (111.4 kg)   Height:        Intake/Output Summary (Last 24 hours) at 01/18/16 1202 Last data filed at 01/18/16 1031  Gross per 24 hour  Intake             1200 ml  Output             3000 ml  Net            -1800 ml   Filed Weights   01/16/16 0613 01/17/16 0414 01/18/16 0443  Weight: 253 lb 11.2 oz (115.1 kg) 249 lb 12.8 oz (113.3 kg) 245 lb 9.6 oz (111.4 kg)    PHYSICAL EXAM  General: Pleasant, NAD. Neuro: Alert and oriented X 3. Moves all extremities spontaneously. Psych: Normal affect. HEENT:  Normal  Neck: Supple without bruits or JVD. Lungs:  Resp regular and unlabored, CTA. Heart: RRR no s3, s4, or murmurs. Abdomen: Soft, non-tender, non-distended, BS + x 4.  Extremities: No clubbing, cyanosis, 2+ peripheral edema. DP/PT/Radials 2+ and equal bilaterally.  Accessory Clinical Findings  CBC  Recent Labs  01/15/16 1301  WBC 7.8  HGB 15.0  HCT 45.5  MCV 96.0  PLT 197   Basic Metabolic  Panel  Recent Labs  16/10/96 1605  01/17/16 0532 01/18/16 0530  NA  --   < > 138 139  K  --   < > 4.4 3.7  CL  --   < > 96* 97*  CO2  --   < > 36* 34*  GLUCOSE  --   < > 103* 88  BUN  --   < > 12 18  CREATININE  --   < > 1.27* 1.14  CALCIUM  --   < > 9.0 8.6*  MG 2.2  --   --   --   < > = values in this interval not displayed. Liver Function Tests No results for input(s): AST, ALT, ALKPHOS, BILITOT, PROT, ALBUMIN in the last 72 hours. No results for input(s): LIPASE, AMYLASE in the last 72 hours. Cardiac Enzymes No results for input(s): CKTOTAL, CKMB, CKMBINDEX, TROPONINI in the last 72 hours. BNP Invalid input(s): POCBNP D-Dimer No results for input(s): DDIMER in the last 72 hours. Hemoglobin A1C No results for input(s): HGBA1C in the last 72 hours. Fasting Lipid  Panel No results for input(s): CHOL, HDL, LDLCALC, TRIG, CHOLHDL, LDLDIRECT in the last 72 hours. Thyroid Function Tests  Recent Labs  01/15/16 1605  TSH 2.898    TELE  Atrial fib with a CVR and PVC's.  Radiology/Studies  Dg Chest 2 View  Result Date: 01/15/2016 CLINICAL DATA:  66 year old male with shortness of Breath for 2 days. Recent lower extremity swelling also. Smoker. COPD. Initial encounter. EXAM: CHEST  2 VIEW COMPARISON:  Portable chest 11/17/2003, two-view chest 07/18/2003. FINDINGS: Chronic left chest cardiac AICD. One abandoned lead now present. Chronic cardiomegaly, stable to mildly increased. Other mediastinal contours remain normal. Visualized tracheal air column is within normal limits. Small bilateral pleural effusions are new, larger on the right. Patchy associated bibasilar opacity. Increased pulmonary vascularity. No pneumothorax. No acute osseous abnormality identified. IMPRESSION: Pulmonary interstitial edema with new small small pleural effusions, greater on the right. Superimposed Patchy bibasilar opacity probably is atelectasis. Electronically Signed   By: Odessa Fleming M.D.   On: 01/15/2016  13:27    ASSESSMENT AND PLAN 1. Acute on chronic systolic heart failure - he continues to improve with a 4 lb weight loss since yesterday. Continue IV diuresis.  2. Atrial fib - this is a new diagnosis. His rate is controlled. He will continue his xarelto. Would not be inclined to try and cardiovert in the short term. 3. ICD - appears to be working normally. Usual followup.  Sharlot Gowda Taylor,M.D.  01/18/2016 12:02 PMPatient ID: Max Rivas, male   DOB: [birth year], 66 y.o.   MRN: [id number]

## 2016-01-18 NOTE — Progress Notes (Signed)
PROGRESS NOTE    Max Rivas  ASN:[id number] DOB: Mar 22, 1950 DOA: 01/15/2016 PCP: Jyl Heinz, MD   Brief Narrative:   66 y/o ? 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 Ladona Ridgel in 2005 Obstructive sleep apnea unable to tolerate BiPAP Morbid obesity, Body mass index is 35.67 kg/m. COPD Former smoker-until October 2017  1/52 on 12/26 h/o flulike symptoms, roommate had this. The headache week. Treated with NyQuil cold and flu also had some dizziness-symptoms resolved over 3-4 days Felt short of breath with ambulation-could only go to her mailbox and back, also dizzy and tachycardic Noticed swelling of right ankle which progressed up to his knees bilaterally CalledPCPs office but could not get in until 1/4/8 Decided to come to emergency room   found to be in new onset flutter with RVR ACUTE decompensated systolic heart failure  EKG on admit shows flutter and QRS axis is 45 rate related changes with deep S waves across precordium Pro BNP 599 Troponin 0.02 TSH 2.8 Chest x-ray interstitial edema, new pleural effusions  Assessment & Plan:   Active Problems:   Flutter-fibrillation   Acute systolic congestive heart failure (HCC)   Atrial fibrillation with RVR (HCC)   DCM (dilated cardiomyopathy) (HCC)   Coronary artery disease involving native coronary artery of native heart without angina pectoris  Acute decompensated systolic heart failure Given Lasix 40 mg in ED Is Lasix nave at home-only on 20 mg MWF so started 40 mg IV twice a day cardiology did recommend to the  patient IV Lasix at one more day Repeat echocardiogram 20% to 25%-defer to cardiology Resume Valsartan as an out patient  -4 L liters, wght down 4 kilograms Less edema  New onset A. fib flutter Probably brought on by beta agonist albuterol Continue Toprol-XL 75 2 times a day cont digoxin 0.25 Has been started on Xarelto for A. Flutter  Obstructive sleep apnea not on BiPAP Needs sleep study has now outpatient Probable substrate for flutter fib  Recent flu, flu test neg Not a candidate for Tamiflu currently  Morbid obesity, Body mass index is 35.67 kg/m. Needs outpatient management   DO NOT RESUSCITATE status Inpatient -transfer to tele No family present at the bedside   DVT prophylaxis: anticoagulated xarelto Code Status: DNR Family Communication: patient spoke to family Disposition Plan:     Consultants:   cardiology  Procedures:    none  Antimicrobials:    none    Subjective:  Alert oriented coughing up sputum with incentive spirometer No chest pain Overall feels better Nursing reports requires oxygen as goes into the high 80s without  Objective: Vitals:   01/17/16 2055 01/18/16 0019 01/18/16 0443 01/18/16 0818  BP: 104/60 116/78 114/66   Pulse: 83 95 78 81  Resp: 18 (!) 21 14 16   Temp: 97.8 F (36.6 C) 97.5 F (36.4 C) 98 F (36.7 C) 97.6 F (36.4 C)  TempSrc: Oral Oral Oral Oral  SpO2: 96% 90% 95% 97%  Weight:   111.4 kg (245 lb 9.6 oz)   Height:        Intake/Output Summary (Last 24 hours) at 01/18/16 1617 Last data filed at 01/18/16 1031  Gross per  24 hour  Intake             1200 ml  Output             2200 ml  Net            -1000 ml   Filed Weights   01/16/16 0613 01/17/16 0414 01/18/16 0443  Weight: 115.1 kg (253 lb 11.2 oz) 113.3 kg (249 lb 12.8 oz) 111.4 kg (245 lb 9.6 oz)    Examination:  General exam: Calm and comfortable , on oxygen Respiratory system: Clear mostly-some mild wheeze  breathing less  laboured. Cardiovascular system: S1 & S2 heard, RRR. less jvd, trace edema Gastrointestinal system: Abdomen is nondistended, soft and nontender. No organomegaly or masses felt. Normal bowel sounds heard. Central nervous system: Alert and oriented. No focal neurological deficits. Extremities: Symmetric 5 x 5 power. Skin: No rashes, lesions or ulcers Psychiatry: Judgement and insight appear normal. Mood & affect appropriate.     Data Reviewed: I have personally reviewed following labs and imaging studies  CBC:  Recent Labs Lab 01/15/16 1301  WBC 7.8  HGB 15.0  HCT 45.5  MCV 96.0  PLT 197   Basic Metabolic Panel:  Recent Labs Lab 01/15/16 1301 01/15/16 1605 01/16/16 0303 01/17/16 0532 01/18/16 0530  NA 138  --  137 138 139  K 4.3  --  4.6 4.4 3.7  CL 101  --  98* 96* 97*  CO2 27  --  31 36* 34*  GLUCOSE 107*  --  108* 103* 88  BUN 14  --  14 12 18   CREATININE 1.24  --  1.55* 1.27* 1.14  CALCIUM 9.2  --  9.1 9.0 8.6*  MG  --  2.2  --   --   --    GFR: Estimated Creatinine Clearance: 84.1 mL/min (by C-G formula based on SCr of 1.14 mg/dL). Liver Function Tests: No results for input(s): AST, ALT, ALKPHOS, BILITOT, PROT, ALBUMIN in the last 168 hours. No results for input(s): LIPASE, AMYLASE in the last 168 hours. No results for input(s): AMMONIA in the last 168 hours. Coagulation Profile: No results for input(s): INR, PROTIME in the last 168 hours. Cardiac Enzymes: No results for input(s): CKTOTAL, CKMB, CKMBINDEX, TROPONINI in the last 168 hours. BNP (last 3 results) No results for input(s): PROBNP in the last 8760 hours. HbA1C: No results for input(s): HGBA1C in the last 72 hours. CBG: No results for input(s): GLUCAP in the last 168 hours. Lipid Profile: No results for input(s): CHOL, HDL, LDLCALC, TRIG, CHOLHDL, LDLDIRECT in the last 72 hours. Thyroid Function Tests: No results for input(s): TSH, T4TOTAL, FREET4, T3FREE, THYROIDAB in the last 72  hours. Anemia Panel: No results for input(s): VITAMINB12, FOLATE, FERRITIN, TIBC, IRON, RETICCTPCT in the last 72 hours. Sepsis Labs: No results for input(s): PROCALCITON, LATICACIDVEN in the last 168 hours.  Recent Results (from the past 240 hour(s))  MRSA PCR Screening     Status: None   Collection Time: 01/15/16  8:40 PM  Result Value Ref Range Status   MRSA by PCR NEGATIVE NEGATIVE Final    Comment:        The GeneXpert MRSA Assay (FDA approved for NASAL specimens only), is one component of a comprehensive MRSA colonization surveillance program. It is not intended to diagnose MRSA infection nor to guide or monitor treatment for MRSA infections.          Radiology Studies: No results found.  Scheduled Meds: . aspirin EC  81 mg Oral Daily  . atorvastatin  20 mg Oral q1800  . digoxin  0.25 mg Oral Daily  . furosemide  40 mg Intravenous BID  . metoprolol succinate  75 mg Oral BID  . rivaroxaban  20 mg Oral Q supper  . sodium chloride flush  3 mL Intravenous Q12H   Continuous Infusions:   LOS: 3 days    Time spent: 15    Rhetta Mura, MD [facility] Pager [phone]  If 7PM-7AM, please contact night-coverage [email or web address] Password [email or web address] 01/18/2016, 4:17 PM

## 2016-01-19 LAB — BASIC METABOLIC PANEL
Anion gap: 9 (ref 5–15)
BUN: 17 mg/dL (ref 6–20)
CHLORIDE: 92 mmol/L — AB (ref 101–111)
CO2: 37 mmol/L — AB (ref 22–32)
CREATININE: 1.33 mg/dL — AB (ref 0.61–1.24)
Calcium: 9.1 mg/dL (ref 8.9–10.3)
GFR calc non Af Amer: 55 mL/min — ABNORMAL LOW (ref >60)
Glucose, Bld: 104 mg/dL — ABNORMAL HIGH (ref 65–99)
Potassium: 4 mmol/L (ref 3.5–5.1)
Sodium: 138 mmol/L (ref 135–145)

## 2016-01-19 MED ORDER — ATORVASTATIN CALCIUM 20 MG PO TABS: 20.0000 mg | ORAL_TABLET | Freq: Every day | ORAL | 0 refills | Status: DC

## 2016-01-19 MED ORDER — METOPROLOL SUCCINATE ER 25 MG PO TB24: 75.0000 mg | ORAL_TABLET | Freq: Two times a day (BID) | ORAL | 0 refills | Status: DC

## 2016-01-19 MED ORDER — FUROSEMIDE 20 MG PO TABS: 40.0000 mg | ORAL_TABLET | Freq: Two times a day (BID) | ORAL | 0 refills | Status: DC

## 2016-01-19 MED ORDER — OFF THE BEAT BOOK
Freq: Once | Status: AC
  Administered 2016-01-19: 09:00:00
  Filled 2016-01-19: qty 1

## 2016-01-19 MED ORDER — DIGOXIN 250 MCG PO TABS: 0.2500 mg | ORAL_TABLET | Freq: Every day | ORAL | 0 refills | Status: DC

## 2016-01-19 MED ORDER — RIVAROXABAN 20 MG PO TABS: 20.0000 mg | ORAL_TABLET | Freq: Every day | ORAL | 0 refills | Status: DC

## 2016-01-19 NOTE — Progress Notes (Addendum)
SATURATION QUALIFICATIONS: (This note is used to comply with regulatory documentation for home oxygen)  Patient Saturations on Room Air at Rest = 93%  Patient Saturations on Room Air while Ambulating = 89%  Patient Saturations on  Liters of oxygen while Ambulating = %  Please briefly explain why patient needs home oxygen:  After ambulating down the hall to emergency exit and back to room, pt sat on side of bed for about and SpO2 came up to 93% on Room Air.  Pt states he has been on Oxygen (1-2L) continuously since 01/17/16 (Friday). Oxygen was removed at around 0930 today.

## 2016-01-19 NOTE — Care Management Note (Addendum)
Case Management Note  Patient Details  Name: Max Rivas MRN: [id number] Date of Birth: 18-May-1950  Subjective/Objective:                  66 year old male admitted with flutter-fibrillation, Acute systolic congestive heart failure, Atrial fibrillation with RVR, DCM (dilated cardiomyopathy), and Coronary artery disease involving native coronary artery of native heart without angina pectoris.   Action/Plan: Spoke with patient ready for discharge, verified patient has insurance through [employer] ([place]), [employer] given a copy of insurance card, and patient advised if home oxygen needed will need prior authorization from [place].  Patient voices understanding and is in agreement to oxygen saturation test.  Telephone to [facility] [phone]), message states office currently closed.  Patient does not qualify for home oxygen due to results of recent oxygen saturation test results on 01/19/16 at 11:09am.    Telephone call to Dr. Mahala Menghini, advised of oxygen test results, patient does not qualify for home oxygen, primary insurance is [employer], voices understanding, and states patient does not have any other CM needs at this time and ready for discharge.  Patient's nurse Michelle Nasuti advised of above.   Expected Discharge Date:  01/19/16              Expected Discharge Plan:  Home/Self Care  In-House Referral:  NA  Discharge planning Services  CM Consult  Post Acute Care Choice:  NA Choice offered to:  NA  DME Arranged:  N/A DME Agency:  NA  HH Arranged:  NA HH Agency:  NA  Status of Service:  Completed, signed off  If discussed at Long Length of Stay Meetings, dates discussed:    Additional Comments:  Shelda Pal, RN 01/19/2016, 12:15 PM

## 2016-01-19 NOTE — Discharge Summary (Signed)
Physician Discharge Summary  Max Rivas:[id number] DOB: [birth year] DOA: 01/15/2016  PCP: Jyl Heinz, MD  Admit date: 01/15/2016 Discharge date: 01/19/2016  Time spent: 35 minutes  Recommendations for Outpatient Follow-up:  1. Needs oxygen on d/c home 2. No PT needs 3. Get CMET and renal panel 1 week Dr. Mack Guise office 4. Multiple changes to meds-Metroprolol xl 50-->75, added digoxin,xarelto, bid lasix this admitconside rsleep study as OP  Discharge Diagnoses:  Active Problems:   Flutter-fibrillation   Acute systolic congestive heart failure (HCC)   Atrial fibrillation with RVR (HCC)   DCM (dilated cardiomyopathy) (HCC)   Coronary artery disease involving native coronary artery of native heart without angina pectoris   Discharge Condition: improved  Diet recommendation: hh low salt  Filed Weights   01/17/16 0414 01/18/16 0443 01/19/16 0612  Weight: 113.3 kg (249 lb 12.8 oz) 111.4 kg (245 lb 9.6 oz) 109.5 kg (241 lb 6.4 oz)    History of present illness:  66 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 Ladona Ridgel in 2005 Obstructive sleep apnea unable to tolerate BiPAP Morbid obesity, Body mass index is 35.67 kg/m.  COPD Former smoker-until October 2017  Acute decompensated systolic heart failure Acute hypoxic resp failure secondary to this Given Lasix 40 mg in ED Is Lasix nave at home-only on 20 mg MWF so started 40 mg IV twice a day cardiology did recommend to the patient IV Lasix at one more day Repeat echocardiogram 20% to 25%-defer to cardiology Resume Valsartan  as an out patient  -4 L liters, wght down 4 kilograms Less edema Changed to bid lasix 40 on d/c home desat to 85% with activity-needs Home oxygen on d/c   New onset A. fib flutter Probably brought on by beta agonist albuterol Continue Toprol-XL 75 2 times a day cont digoxin 0.25 Has been started on Xarelto for A. Flutter OP follow up at [facility]   Obstructive sleep apnea not on BiPAP Needs sleep study has now outpatient Probable substrate for flutter fib   Recent flu, flu test neg Not a candidate for Tamiflu currently   Morbid obesity, Body mass index is 35.67 kg/m. Needs outpatient management     DO NOT RESUSCITATE status Inpatient -transfer to tele No family present at the bedside    Consultations:  cardiology  Discharge Exam: Vitals:   01/19/16 0031 01/19/16 0612  BP: 111/65 107/82  Pulse: 90 (!) 38  Resp: 20 20  Temp: 97.9 F (36.6 C) 97.4 F (36.3 C)    General:  Alert pleasant, oriented Cardiovascular: s1 s2 m/r/g Respiratory:  Clear but no added sound.   No unilateral weakness--no cp, no SOB  Discharge Instructions   Discharge Instructions    Diet - low sodium heart healthy    Complete by:  As directed    Discharge instructions    Complete by:  As directed  Note the changes to many medications that you have been prescirbed and your prior to admit meds- You in addition will be on a blood thinner life long You will require short term oxygen on discharge and this will be arranged prior to discharge home Please get labs in about 1-2 weeks at Dr. Mack Guise office You also will need daily weights checked and x 2 daily fluid pills.  f you gain above 2 kg in 24 hours, then you should have another 40 mg lasix tablet   Increase activity slowly    Complete by:  As directed      Current Discharge Medication List    START taking these medications   Details  atorvastatin (LIPITOR) 20 MG tablet Take 1 tablet (20 mg total) by mouth daily at 6 PM. Qty: 30  tablet, Refills: 0    digoxin (LANOXIN) 0.25 MG tablet Take 1 tablet (0.25 mg total) by mouth daily. Qty: 30 tablet, Refills: 0    rivaroxaban (XARELTO) 20 MG TABS tablet Take 1 tablet (20 mg total) by mouth daily with supper. Qty: 30 tablet, Refills: 0      CONTINUE these medications which have CHANGED   Details  furosemide (LASIX) 20 MG tablet Take 2 tablets (40 mg total) by mouth 2 (two) times daily. Qty: 30 tablet, Refills: 0    metoprolol succinate (TOPROL-XL) 25 MG 24 hr tablet Take 3 tablets (75 mg total) by mouth 2 (two) times daily. Qty: 180 tablet, Refills: 0      CONTINUE these medications which have NOT CHANGED   Details  albuterol (PROVENTIL HFA;VENTOLIN HFA) 108 (90 Base) MCG/ACT inhaler Inhale 2 puffs into the lungs every 6 (six) hours as needed for wheezing or shortness of breath.    aspirin EC 81 MG tablet Take 81 mg by mouth daily.    mometasone (ASMANEX) 220 MCG/INH inhaler Inhale 2 puffs into the lungs daily.    Multiple Vitamin (MULTIVITAMIN WITH MINERALS) TABS tablet Take 1 tablet by mouth daily.    Omega-3 Fatty Acids (SUPER OMEGA 3 EPA/DHA) 1000 MG CAPS Take 1 capsule by mouth 2 (two) times daily.    omeprazole (PRILOSEC) 20 MG capsule Take 40 mg by mouth daily.    Tiotropium Bromide-Olodaterol (STIOLTO RESPIMAT) 2.5-2.5 MCG/ACT AERS Inhale 2 puffs into the lungs daily.      STOP taking these medications     Menthol (HALLS COUGH DROPS MT)      nitroGLYCERIN (NITROSTAT) 0.4 MG SL tablet      valsartan (DIOVAN) 40 MG tablet        Allergies  Allergen Reactions  . Crestor [Rosuvastatin Calcium] Other (See Comments)    Back spasms   Follow-up Information    [name] EMERGENCY DEPARTMENT Follow up.   Specialty:  Emergency Medicine Contact information: [address] [id number] mc [place] [address] [phone]       [facility] Follow up on 01/29/2016.   Why:  @ 10:00 am for hospital  follow up with MD Armor. Xarelto to be discussed as well.  Contact information: [address] [facility] [address] [place] [address] [phone]            The results of significant diagnostics from this hospitalization (including imaging, microbiology, ancillary and laboratory) are listed below for reference.    Significant Diagnostic Studies: Dg Chest 2 View  Result Date: 01/15/2016 CLINICAL DATA:  66 year old male with shortness of Breath for 2 days. Recent lower extremity swelling also. Smoker.  COPD. Initial encounter. EXAM: CHEST  2 VIEW COMPARISON:  Portable chest 11/17/2003, two-view chest 07/18/2003. FINDINGS: Chronic left chest cardiac AICD. One abandoned lead now present. Chronic cardiomegaly, stable to mildly increased. Other mediastinal contours remain normal. Visualized tracheal air column is within normal limits. Small bilateral pleural effusions are new, larger on the right. Patchy associated bibasilar opacity. Increased pulmonary vascularity. No pneumothorax. No acute osseous abnormality identified. IMPRESSION: Pulmonary interstitial edema with new small small pleural effusions, greater on the right. Superimposed Patchy bibasilar opacity probably is atelectasis. Electronically Signed   By: Odessa Fleming M.D.   On: 01/15/2016 13:27    Microbiology: Recent Results (from the past 240 hour(s))  MRSA PCR Screening     Status: None   Collection Time: 01/15/16  8:40 PM  Result Value Ref Range Status   MRSA by PCR NEGATIVE NEGATIVE Final    Comment:        The GeneXpert MRSA Assay (FDA approved for NASAL specimens only), is one component of a comprehensive MRSA colonization surveillance program. It is not intended to diagnose MRSA infection nor to guide or monitor treatment for MRSA infections.      Labs: Basic Metabolic Panel:  Recent Labs Lab 01/15/16 1301 01/15/16 1605 01/16/16 0303 01/17/16 0532 01/18/16 0530 01/19/16 0413  NA 138  --  137 138 139 138  K  4.3  --  4.6 4.4 3.7 4.0  CL 101  --  98* 96* 97* 92*  CO2 27  --  31 36* 34* 37*  GLUCOSE 107*  --  108* 103* 88 104*  BUN 14  --  14 12 18 17   CREATININE 1.24  --  1.55* 1.27* 1.14 1.33*  CALCIUM 9.2  --  9.1 9.0 8.6* 9.1  MG  --  2.2  --   --   --   --    Liver Function Tests: No results for input(s): AST, ALT, ALKPHOS, BILITOT, PROT, ALBUMIN in the last 168 hours. No results for input(s): LIPASE, AMYLASE in the last 168 hours. No results for input(s): AMMONIA in the last 168 hours. CBC:  Recent Labs Lab 01/15/16 1301  WBC 7.8  HGB 15.0  HCT 45.5  MCV 96.0  PLT 197   Cardiac Enzymes: No results for input(s): CKTOTAL, CKMB, CKMBINDEX, TROPONINI in the last 168 hours. BNP: BNP (last 3 results)  Recent Labs  01/15/16 1605  BNP 599.4*    ProBNP (last 3 results) No results for input(s): PROBNP in the last 8760 hours.  CBG: No results for input(s): GLUCAP in the last 168 hours.     SignedRhetta Mura MD   [facility] 01/19/2016, 9:50 AM

## 2016-02-24 ENCOUNTER — Encounter (HOSPITAL_COMMUNITY): Payer: Self-pay | Admitting: Emergency Medicine

## 2016-02-24 ENCOUNTER — Inpatient Hospital Stay (HOSPITAL_COMMUNITY): Payer: Non-veteran care

## 2016-02-24 ENCOUNTER — Inpatient Hospital Stay (HOSPITAL_COMMUNITY)
Admission: EM | Admit: 2016-02-24 | Discharge: 2016-03-05 | DRG: 291 | Disposition: A | Discharge status: Home Health | Payer: Non-veteran care | Attending: Internal Medicine | Admitting: Internal Medicine

## 2016-02-24 ENCOUNTER — Emergency Department (HOSPITAL_COMMUNITY): Payer: Non-veteran care

## 2016-02-24 DIAGNOSIS — Z955 Presence of coronary angioplasty implant and graft: Secondary | ICD-10-CM

## 2016-02-24 DIAGNOSIS — E873 Alkalosis: Secondary | ICD-10-CM | POA: Diagnosis not present

## 2016-02-24 DIAGNOSIS — J449 Chronic obstructive pulmonary disease, unspecified: Secondary | ICD-10-CM

## 2016-02-24 DIAGNOSIS — I5023 Acute on chronic systolic (congestive) heart failure: Secondary | ICD-10-CM | POA: Diagnosis present

## 2016-02-24 DIAGNOSIS — J90 Pleural effusion, not elsewhere classified: Secondary | ICD-10-CM | POA: Insufficient documentation

## 2016-02-24 DIAGNOSIS — Z9981 Dependence on supplemental oxygen: Secondary | ICD-10-CM | POA: Diagnosis not present

## 2016-02-24 DIAGNOSIS — N183 Chronic kidney disease, stage 3 (moderate): Secondary | ICD-10-CM | POA: Diagnosis present

## 2016-02-24 DIAGNOSIS — I420 Dilated cardiomyopathy: Secondary | ICD-10-CM | POA: Diagnosis present

## 2016-02-24 DIAGNOSIS — F17210 Nicotine dependence, cigarettes, uncomplicated: Secondary | ICD-10-CM | POA: Diagnosis present

## 2016-02-24 DIAGNOSIS — I2699 Other pulmonary embolism without acute cor pulmonale: Secondary | ICD-10-CM | POA: Diagnosis present

## 2016-02-24 DIAGNOSIS — R590 Localized enlarged lymph nodes: Secondary | ICD-10-CM

## 2016-02-24 DIAGNOSIS — Z7982 Long term (current) use of aspirin: Secondary | ICD-10-CM

## 2016-02-24 DIAGNOSIS — R0902 Hypoxemia: Secondary | ICD-10-CM | POA: Insufficient documentation

## 2016-02-24 DIAGNOSIS — J9811 Atelectasis: Secondary | ICD-10-CM | POA: Diagnosis present

## 2016-02-24 DIAGNOSIS — J441 Chronic obstructive pulmonary disease with (acute) exacerbation: Secondary | ICD-10-CM | POA: Diagnosis present

## 2016-02-24 DIAGNOSIS — R791 Abnormal coagulation profile: Secondary | ICD-10-CM | POA: Diagnosis present

## 2016-02-24 DIAGNOSIS — Z6833 Body mass index (BMI) 33.0-33.9, adult: Secondary | ICD-10-CM | POA: Diagnosis not present

## 2016-02-24 DIAGNOSIS — G4730 Sleep apnea, unspecified: Secondary | ICD-10-CM | POA: Diagnosis present

## 2016-02-24 DIAGNOSIS — J929 Pleural plaque without asbestos: Secondary | ICD-10-CM

## 2016-02-24 DIAGNOSIS — Z9119 Patient's noncompliance with other medical treatment and regimen: Secondary | ICD-10-CM

## 2016-02-24 DIAGNOSIS — Z7901 Long term (current) use of anticoagulants: Secondary | ICD-10-CM

## 2016-02-24 DIAGNOSIS — I5021 Acute systolic (congestive) heart failure: Secondary | ICD-10-CM | POA: Diagnosis present

## 2016-02-24 DIAGNOSIS — Z66 Do not resuscitate: Secondary | ICD-10-CM | POA: Diagnosis present

## 2016-02-24 DIAGNOSIS — T501X5A Adverse effect of loop [high-ceiling] diuretics, initial encounter: Secondary | ICD-10-CM | POA: Diagnosis not present

## 2016-02-24 DIAGNOSIS — D696 Thrombocytopenia, unspecified: Secondary | ICD-10-CM | POA: Diagnosis not present

## 2016-02-24 DIAGNOSIS — I252 Old myocardial infarction: Secondary | ICD-10-CM | POA: Diagnosis not present

## 2016-02-24 DIAGNOSIS — Z888 Allergy status to other drugs, medicaments and biological substances status: Secondary | ICD-10-CM

## 2016-02-24 DIAGNOSIS — E871 Hypo-osmolality and hyponatremia: Secondary | ICD-10-CM | POA: Diagnosis present

## 2016-02-24 DIAGNOSIS — Z8249 Family history of ischemic heart disease and other diseases of the circulatory system: Secondary | ICD-10-CM

## 2016-02-24 DIAGNOSIS — I2510 Atherosclerotic heart disease of native coronary artery without angina pectoris: Secondary | ICD-10-CM | POA: Diagnosis present

## 2016-02-24 DIAGNOSIS — E876 Hypokalemia: Secondary | ICD-10-CM | POA: Diagnosis not present

## 2016-02-24 DIAGNOSIS — I4891 Unspecified atrial fibrillation: Secondary | ICD-10-CM | POA: Diagnosis present

## 2016-02-24 DIAGNOSIS — R591 Generalized enlarged lymph nodes: Secondary | ICD-10-CM

## 2016-02-24 DIAGNOSIS — R918 Other nonspecific abnormal finding of lung field: Secondary | ICD-10-CM | POA: Diagnosis present

## 2016-02-24 DIAGNOSIS — I482 Chronic atrial fibrillation: Secondary | ICD-10-CM

## 2016-02-24 DIAGNOSIS — I130 Hypertensive heart and chronic kidney disease with heart failure and stage 1 through stage 4 chronic kidney disease, or unspecified chronic kidney disease: Secondary | ICD-10-CM | POA: Diagnosis not present

## 2016-02-24 DIAGNOSIS — I248 Other forms of acute ischemic heart disease: Secondary | ICD-10-CM | POA: Diagnosis present

## 2016-02-24 DIAGNOSIS — K219 Gastro-esophageal reflux disease without esophagitis: Secondary | ICD-10-CM | POA: Diagnosis present

## 2016-02-24 DIAGNOSIS — J9621 Acute and chronic respiratory failure with hypoxia: Secondary | ICD-10-CM | POA: Diagnosis present

## 2016-02-24 DIAGNOSIS — Z79899 Other long term (current) drug therapy: Secondary | ICD-10-CM

## 2016-02-24 DIAGNOSIS — I255 Ischemic cardiomyopathy: Secondary | ICD-10-CM | POA: Diagnosis present

## 2016-02-24 DIAGNOSIS — I2782 Chronic pulmonary embolism: Secondary | ICD-10-CM

## 2016-02-24 DIAGNOSIS — J9602 Acute respiratory failure with hypercapnia: Secondary | ICD-10-CM | POA: Diagnosis present

## 2016-02-24 DIAGNOSIS — J9601 Acute respiratory failure with hypoxia: Secondary | ICD-10-CM | POA: Diagnosis present

## 2016-02-24 DIAGNOSIS — E785 Hyperlipidemia, unspecified: Secondary | ICD-10-CM | POA: Diagnosis present

## 2016-02-24 HISTORY — DX: Acute myocardial infarction, unspecified: I21.9

## 2016-02-24 HISTORY — DX: Pure hypercholesterolemia, unspecified: E78.00

## 2016-02-24 HISTORY — DX: Sleep apnea, unspecified: G47.30

## 2016-02-24 HISTORY — DX: Other pulmonary embolism without acute cor pulmonale: I26.99

## 2016-02-24 HISTORY — DX: Dependence on supplemental oxygen: Z99.81

## 2016-02-24 HISTORY — DX: Unspecified osteoarthritis, unspecified site: M19.90

## 2016-02-24 LAB — CBC WITH DIFFERENTIAL/PLATELET
BASOS ABS: 0 10*3/uL (ref 0.0–0.1)
BASOS ABS: 0 10*3/uL (ref 0.0–0.1)
BASOS PCT: 0 %
BASOS PCT: 0 %
EOS PCT: 1 %
Eosinophils Absolute: 0 10*3/uL (ref 0.0–0.7)
Eosinophils Absolute: 0.1 10*3/uL (ref 0.0–0.7)
Eosinophils Relative: 0 %
HCT: 48 % (ref 39.0–52.0)
HEMATOCRIT: 48.2 % (ref 39.0–52.0)
HEMOGLOBIN: 16.1 g/dL (ref 13.0–17.0)
Hemoglobin: 15.9 g/dL (ref 13.0–17.0)
LYMPHS PCT: 27 %
Lymphocytes Relative: 24 %
Lymphs Abs: 2.4 10*3/uL (ref 0.7–4.0)
Lymphs Abs: 2.3 10*3/uL (ref 0.7–4.0)
MCH: 31.4 pg (ref 26.0–34.0)
MCH: 31.4 pg (ref 26.0–34.0)
MCHC: 33.4 g/dL (ref 30.0–36.0)
MCHC: 33.1 g/dL (ref 30.0–36.0)
MCV: 94.1 fL (ref 78.0–100.0)
MCV: 94.9 fL (ref 78.0–100.0)
MONOS PCT: 11 %
Monocytes Absolute: 1.1 10*3/uL — ABNORMAL HIGH (ref 0.1–1.0)
Monocytes Absolute: 1 10*3/uL (ref 0.1–1.0)
Monocytes Relative: 11 %
NEUTROS ABS: 6.4 10*3/uL (ref 1.7–7.7)
NEUTROS PCT: 65 %
Neutro Abs: 5.4 10*3/uL (ref 1.7–7.7)
Neutrophils Relative %: 61 %
PLATELETS: 170 10*3/uL (ref 150–400)
Platelets: 175 10*3/uL (ref 150–400)
RBC: 5.12 MIL/uL (ref 4.22–5.81)
RBC: 5.06 MIL/uL (ref 4.22–5.81)
RDW: 16.2 % — ABNORMAL HIGH (ref 11.5–15.5)
RDW: 16.4 % — ABNORMAL HIGH (ref 11.5–15.5)
WBC: 9.9 10*3/uL (ref 4.0–10.5)
WBC: 8.8 10*3/uL (ref 4.0–10.5)

## 2016-02-24 LAB — COMPREHENSIVE METABOLIC PANEL
ALT: 35 U/L (ref 17–63)
ANION GAP: 13 (ref 5–15)
AST: 30 U/L (ref 15–41)
Albumin: 3.1 g/dL — ABNORMAL LOW (ref 3.5–5.0)
Alkaline Phosphatase: 92 U/L (ref 38–126)
BILIRUBIN TOTAL: 2.3 mg/dL — AB (ref 0.3–1.2)
BUN: 32 mg/dL — AB (ref 6–20)
CALCIUM: 8.8 mg/dL — AB (ref 8.9–10.3)
CO2: 23 mmol/L (ref 22–32)
Chloride: 92 mmol/L — ABNORMAL LOW (ref 101–111)
Creatinine, Ser: 1.47 mg/dL — ABNORMAL HIGH (ref 0.61–1.24)
GFR calc Af Amer: 57 mL/min — ABNORMAL LOW (ref >60)
GFR, EST NON AFRICAN AMERICAN: 49 mL/min — AB (ref >60)
Glucose, Bld: 101 mg/dL — ABNORMAL HIGH (ref 65–99)
POTASSIUM: 4.2 mmol/L (ref 3.5–5.1)
Sodium: 128 mmol/L — ABNORMAL LOW (ref 135–145)
TOTAL PROTEIN: 6.1 g/dL — AB (ref 6.5–8.1)

## 2016-02-24 LAB — BASIC METABOLIC PANEL
ANION GAP: 11 (ref 5–15)
BUN: 32 mg/dL — ABNORMAL HIGH (ref 6–20)
CHLORIDE: 94 mmol/L — AB (ref 101–111)
CO2: 22 mmol/L (ref 22–32)
Calcium: 8.9 mg/dL (ref 8.9–10.3)
Creatinine, Ser: 1.37 mg/dL — ABNORMAL HIGH (ref 0.61–1.24)
GFR calc non Af Amer: 53 mL/min — ABNORMAL LOW (ref >60)
Glucose, Bld: 120 mg/dL — ABNORMAL HIGH (ref 65–99)
POTASSIUM: 4.6 mmol/L (ref 3.5–5.1)
SODIUM: 127 mmol/L — AB (ref 135–145)

## 2016-02-24 LAB — DIGOXIN LEVEL

## 2016-02-24 LAB — HEPARIN LEVEL (UNFRACTIONATED)
HEPARIN UNFRACTIONATED: 0.39 [IU]/mL (ref 0.30–0.70)
Heparin Unfractionated: 0.46 IU/mL (ref 0.30–0.70)

## 2016-02-24 LAB — TROPONIN I: Troponin I: <0.03 ng/mL (ref <0.03)

## 2016-02-24 LAB — TSH: TSH: 8.811 u[IU]/mL — AB (ref 0.350–4.500)

## 2016-02-24 LAB — MAGNESIUM: MAGNESIUM: 2.1 mg/dL (ref 1.7–2.4)

## 2016-02-24 LAB — ECHOCARDIOGRAM COMPLETE

## 2016-02-24 LAB — D-DIMER, QUANTITATIVE: D-Dimer, Quant: 3.99 ug/mL-FEU — ABNORMAL HIGH (ref 0.00–0.50)

## 2016-02-24 LAB — I-STAT TROPONIN, ED: TROPONIN I, POC: 0.02 ng/mL (ref 0.00–0.08)

## 2016-02-24 LAB — BRAIN NATRIURETIC PEPTIDE: B NATRIURETIC PEPTIDE 5: 1633 pg/mL — AB (ref 0.0–100.0)

## 2016-02-24 MED ORDER — FUROSEMIDE 10 MG/ML IJ SOLN: 80.0000 mg | Freq: Two times a day (BID) | INTRAMUSCULAR | Status: DC

## 2016-02-24 MED ORDER — ASPIRIN EC 81 MG PO TBEC
81.0000 mg | DELAYED_RELEASE_TABLET | Freq: Every day | ORAL | Status: DC
  Administered 2016-02-24 – 2016-03-04 (×10): 81 mg via ORAL
  Filled 2016-02-24 (×10): qty 1

## 2016-02-24 MED ORDER — ATORVASTATIN CALCIUM 20 MG PO TABS
20.0000 mg | ORAL_TABLET | Freq: Every day | ORAL | Status: DC
  Administered 2016-02-24 – 2016-03-04 (×10): 20 mg via ORAL
  Filled 2016-02-24 (×11): qty 1

## 2016-02-24 MED ORDER — HEPARIN BOLUS VIA INFUSION
600.0000 [IU] | Freq: Once | INTRAVENOUS | Status: AC
  Administered 2016-02-24: 600 [IU] via INTRAVENOUS
  Filled 2016-02-24: qty 600

## 2016-02-24 MED ORDER — PERFLUTREN LIPID MICROSPHERE
1.0000 mL | INTRAVENOUS | Status: AC | PRN
  Administered 2016-02-24: 4 mL via INTRAVENOUS
  Filled 2016-02-24: qty 10

## 2016-02-24 MED ORDER — LEVALBUTEROL HCL 0.63 MG/3ML IN NEBU: 0.6300 mg | INHALATION_SOLUTION | Freq: Four times a day (QID) | RESPIRATORY_TRACT | Status: DC | PRN

## 2016-02-24 MED ORDER — HEPARIN (PORCINE) IN NACL 100-0.45 UNIT/ML-% IJ SOLN
1600.0000 [IU]/h | INTRAMUSCULAR | Status: DC
  Administered 2016-02-24 (×2): 1600 [IU]/h via INTRAVENOUS
  Filled 2016-02-24 (×3): qty 250

## 2016-02-24 MED ORDER — ONDANSETRON HCL 4 MG PO TABS
4.0000 mg | ORAL_TABLET | Freq: Four times a day (QID) | ORAL | Status: DC | PRN
  Filled 2016-02-24: qty 1

## 2016-02-24 MED ORDER — DIGOXIN 0.25 MG/ML IJ SOLN
0.2500 mg | Freq: Once | INTRAMUSCULAR | Status: AC
  Administered 2016-02-24: 0.25 mg via INTRAVENOUS
  Filled 2016-02-24: qty 2

## 2016-02-24 MED ORDER — ACETAMINOPHEN 650 MG RE SUPP: 650.0000 mg | Freq: Four times a day (QID) | RECTAL | Status: DC | PRN

## 2016-02-24 MED ORDER — FUROSEMIDE 10 MG/ML IJ SOLN
100.0000 mg | Freq: Once | INTRAMUSCULAR | Status: AC
  Administered 2016-02-24: 100 mg via INTRAVENOUS
  Filled 2016-02-24: qty 10

## 2016-02-24 MED ORDER — SPIRONOLACTONE 25 MG PO TABS
12.5000 mg | ORAL_TABLET | Freq: Every day | ORAL | Status: DC
  Administered 2016-02-25 – 2016-02-26 (×2): 12.5 mg via ORAL
  Filled 2016-02-24 (×2): qty 1

## 2016-02-24 MED ORDER — ONDANSETRON HCL 4 MG/2ML IJ SOLN: 4.0000 mg | Freq: Four times a day (QID) | INTRAMUSCULAR | Status: DC | PRN

## 2016-02-24 MED ORDER — ADULT MULTIVITAMIN W/MINERALS CH
1.0000 | ORAL_TABLET | Freq: Every day | ORAL | Status: DC
  Administered 2016-02-24 – 2016-03-05 (×11): 1 via ORAL
  Filled 2016-02-24 (×11): qty 1

## 2016-02-24 MED ORDER — PANTOPRAZOLE SODIUM 40 MG PO TBEC
40.0000 mg | DELAYED_RELEASE_TABLET | Freq: Every day | ORAL | Status: DC
  Administered 2016-02-24 – 2016-03-05 (×11): 40 mg via ORAL
  Filled 2016-02-24 (×11): qty 1

## 2016-02-24 MED ORDER — SUPER OMEGA 3 EPA/DHA 1000 MG PO CAPS: 1.0000 | ORAL_CAPSULE | Freq: Two times a day (BID) | ORAL | Status: DC

## 2016-02-24 MED ORDER — ACETAMINOPHEN 325 MG PO TABS: 650.0000 mg | ORAL_TABLET | Freq: Four times a day (QID) | ORAL | Status: DC | PRN

## 2016-02-24 MED ORDER — OMEGA-3-ACID ETHYL ESTERS 1 G PO CAPS
1.0000 g | ORAL_CAPSULE | Freq: Two times a day (BID) | ORAL | Status: DC
  Administered 2016-02-24 – 2016-03-05 (×21): 1 g via ORAL
  Filled 2016-02-24 (×23): qty 1

## 2016-02-24 MED ORDER — FUROSEMIDE 10 MG/ML IJ SOLN
80.0000 mg | Freq: Three times a day (TID) | INTRAMUSCULAR | Status: DC
  Administered 2016-02-24 – 2016-03-02 (×23): 80 mg via INTRAVENOUS
  Filled 2016-02-24 (×23): qty 8

## 2016-02-24 MED ORDER — POTASSIUM CHLORIDE CRYS ER 20 MEQ PO TBCR
20.0000 meq | EXTENDED_RELEASE_TABLET | Freq: Two times a day (BID) | ORAL | Status: DC
  Administered 2016-02-24 (×2): 20 meq via ORAL
  Filled 2016-02-24 (×2): qty 1

## 2016-02-24 MED ORDER — IOPAMIDOL (ISOVUE-370) INJECTION 76%
INTRAVENOUS | Status: AC
  Administered 2016-02-24: 100 mL
  Filled 2016-02-24: qty 100

## 2016-02-24 MED ORDER — BUDESONIDE 0.25 MG/2ML IN SUSP
0.2500 mg | Freq: Two times a day (BID) | RESPIRATORY_TRACT | Status: DC
  Administered 2016-02-24 – 2016-02-25 (×3): 0.25 mg via RESPIRATORY_TRACT
  Filled 2016-02-24 (×4): qty 2

## 2016-02-24 MED ORDER — DIGOXIN 250 MCG PO TABS
0.2500 mg | ORAL_TABLET | Freq: Every day | ORAL | Status: DC
  Administered 2016-02-25 – 2016-02-27 (×3): 0.25 mg via ORAL
  Filled 2016-02-24 (×2): qty 1
  Filled 2016-02-24: qty 2

## 2016-02-24 MED ORDER — DIGOXIN 125 MCG PO TABS: 0.2500 mg | ORAL_TABLET | Freq: Every day | ORAL | Status: DC

## 2016-02-24 MED ORDER — METOPROLOL SUCCINATE ER 50 MG PO TB24
75.0000 mg | ORAL_TABLET | Freq: Two times a day (BID) | ORAL | Status: DC
  Administered 2016-02-24 – 2016-02-25 (×3): 75 mg via ORAL
  Filled 2016-02-24 (×4): qty 1

## 2016-02-24 MED ORDER — FUROSEMIDE 10 MG/ML IJ SOLN
80.0000 mg | Freq: Two times a day (BID) | INTRAMUSCULAR | Status: DC
  Filled 2016-02-24: qty 8

## 2016-02-24 NOTE — Consult Note (Signed)
Advanced Heart Failure Team Consult Note  Referring Physician: Dr. Susie Cassette Primary Physician: [facility] -> Dr Mack Guise at [facility] Primary Cardiologist:  [facility]  Reason for Consultation: A/C systolic CHF -> Anasarca  HPI:    Max Rivas is a 66 y.o. male with history of chronic systolic CHF EF 20% due to ICM s/p Medtronic ICD, Atrial fibrillation, CAD s/p 6 stents, obesity, and COPD.   Pt was previously admitted to [facility] 01/15/16 -> 01/29/16 with new onset Afib with RVR. He was started on Xarelto with CHA2DS2/VASc of at least 2 and rate control strategy favored. Pt diuresed as well. Discharge weight 241 lbs.    Pt presented to [facility] overnight with worsening SOB and increased edema. + orthopnea and intermittent left sided sharp chest pain. Pertinent labs on admission include BNP 1633, Creatinine 1.47, K 4.2. Troponin 0.03. D.Dimer positive. CXR with interstitial edema, bilateral pleural effusions, and bilateral atelectasis. Pt has been non-compliant with Xarelto. He states he had to "take a class" to get the medicine via the [place], and has been able to do so thus far.   CTA chest positive for distal right main PA thombosis extending into lower lobe branches. Also suspicious of mass in right infrahilar.  Started on heparin drip. Pulmonary consulted.   States he has been more fatigued over past month.  Doesn't really feel like his fluid went away during last admission, and has been gradually worsening since.  + abdominal distention and edema.  States he has had chills over the past month but no fever. Cough with white sputum, no blood.  As above, he never got Xarelto once discharged from hospital. Weight up at least 10 lbs at home. SOB with minimal activity such as getting dressed or walking 5-10 feet. + Orthopnea.   Review of Systems: [y] = yes, [ ]  = no   General: Weight gain [y]; Weight loss [ ] ; Anorexia [ ] ; Fatigue [y]; Fever [ ] ; Chills [y]; Weakness [y]  Cardiac: Chest pain/pressure  [ ] ; Resting SOB [ ] ; Exertional SOB [y]; Orthopnea [y]; Pedal Edema [y]; Palpitations [ ] ; Syncope [ ] ; Presyncope [ ] ; Paroxysmal nocturnal dyspnea[ ]   Pulmonary: Cough [y]; Wheezing[ ] ; Hemoptysis[ ] ; Sputum [y]; Snoring [ ]   GI: Vomiting[ ] ; Dysphagia[ ] ; Melena[ ] ; Hematochezia [ ] ; Heartburn[ ] ; Abdominal pain [ ] ; Constipation [ ] ; Diarrhea [ ] ; BRBPR [ ]   GU: Hematuria[ ] ; Dysuria [ ] ; Nocturia[ ]   Vascular: Pain in legs with walking [ ] ; Pain in feet with lying flat [ ] ; Non-healing sores [ ] ; Stroke [ ] ; TIA [ ] ; Slurred speech [ ] ;  Neuro: Headaches[ ] ; Vertigo[ ] ; Seizures[ ] ; Paresthesias[ ] ;Blurred vision [ ] ; Diplopia [ ] ; Vision changes [ ]   Ortho/Skin: Arthritis [y]; Joint pain [y]; Muscle pain [ ] ; Joint swelling [ ] ; Back Pain [ ] ; Rash [ ]   Psych: Depression[ ] ; Anxiety[ ]   Heme: Bleeding problems [ ] ; Clotting disorders [ ] ; Anemia [ ]   Endocrine: Diabetes [ ] ; Thyroid dysfunction[ ]   Home Medications Prior to Admission medications   Medication Sig Start Date End Date Taking? Authorizing Provider  albuterol (PROVENTIL HFA;VENTOLIN HFA) 108 (90 Base) MCG/ACT inhaler Inhale 2 puffs into the lungs every 6 (six) hours as needed for wheezing or shortness of breath.   Yes Historical Provider, MD  aspirin EC 81 MG tablet Take 81 mg by mouth daily.   Yes Historical Provider, MD  atorvastatin (LIPITOR) 20 MG tablet Take  1 tablet (20 mg total) by mouth daily at 6 PM. 01/19/16  Yes Rhetta Mura, MD  digoxin (LANOXIN) 0.25 MG tablet Take 1 tablet (0.25 mg total) by mouth daily. 01/20/16  Yes Rhetta Mura, MD  furosemide (LASIX) 20 MG tablet Take 2 tablets (40 mg total) by mouth 2 (two) times daily. 01/19/16  Yes Rhetta Mura, MD  metoprolol succinate (TOPROL-XL) 25 MG 24 hr tablet Take 3 tablets (75 mg total) by mouth 2 (two) times daily. 01/19/16  Yes Rhetta Mura, MD  mometasone (ASMANEX) 220 MCG/INH inhaler Inhale 2 puffs into the lungs daily.   Yes Historical  Provider, MD  Multiple Vitamin (MULTIVITAMIN WITH MINERALS) TABS tablet Take 1 tablet by mouth daily.   Yes Historical Provider, MD  Omega-3 Fatty Acids (SUPER OMEGA 3 EPA/DHA) 1000 MG CAPS Take 1 capsule by mouth 2 (two) times daily.   Yes Historical Provider, MD  omeprazole (PRILOSEC) 20 MG capsule Take 40 mg by mouth daily.   Yes Historical Provider, MD  Tiotropium Bromide-Olodaterol (STIOLTO RESPIMAT) 2.5-2.5 MCG/ACT AERS Inhale 2 puffs into the lungs daily.   Yes Historical Provider, MD  rivaroxaban (XARELTO) 20 MG TABS tablet Take 1 tablet (20 mg total) by mouth daily with supper. 01/19/16   Rhetta Mura, MD    Past Medical History: Past Medical History:  Diagnosis Date  . CHF (congestive heart failure) (HCC)   . COPD (chronic obstructive pulmonary disease) (HCC)   . Coronary artery disease   . GERD (gastroesophageal reflux disease)   . Pacemaker     Past Surgical History: Past Surgical History:  Procedure Laterality Date  . CARDIAC CATHETERIZATION     6 stents per patient  . PACEMAKER GENERATOR CHANGE     original PPM around 2004 for ICM with EF < 35%, generator change 03/21/2011 in [place]    Family History: Family History  Problem Relation Age of Onset  . Heart attack Mother   . Heart attack Father   . Heart attack Sister 24    Social History: Social History   Social History  . Marital status: Divorced    Spouse name: N/A  . Number of children: N/A  . Years of education: N/A   Social History Main Topics  . Smoking status: Current Some Day Smoker    Packs/day: 0.50    Types: Cigarettes  . Smokeless tobacco: Never Used  . Alcohol use No  . Drug use: No  . Sexual activity: Not Asked   Other Topics Concern  . None   Social History Narrative  . None    Allergies:  Allergies  Allergen Reactions  . Crestor [Rosuvastatin Calcium] Other (See Comments)    Back spasms    Objective:    Vital Signs:   Temp:  [97.6 F (36.4 C)] 97.6 F (36.4 C)  (02/12 0117) Pulse Rate:  [50-130] 117 (02/12 1000) Resp:  [16-23] 19 (02/12 1000) BP: (90-121)/(57-96) 116/81 (02/12 1000) SpO2:  [93 %-99 %] 97 % (02/12 1000)    Intake/Output:   Intake/Output Summary (Last 24 hours) at 02/24/16 1042 Last data filed at 02/24/16 1610  Gross per 24 hour  Intake               60 ml  Output              350 ml  Net             -290 ml     Physical Exam: General:  Fatigued appearing.  Obese.  HEENT: Normal Neck: supple. JVP 12-14 cm. Carotids 2+ bilat; no bruits. No lymphadenopathy or thyromegaly appreciated. Cor: PMI nondisplaced. Irregularly irregular.  No rubs, gallops or murmurs. Lungs: Diminished throughout.  Abdomen: mod to severe distention. Edematous, mildly tender. + BS  Extremities: no cyanosis, clubbing, rash. 2-3 + edema with trace to 1+ edema up into flanks.  Neuro: alert & orientedx3, cranial nerves grossly intact. moves all 4 extremities w/o difficulty. Affect pleasant  Telemetry: Reviewed, A fib 90s  Labs: Basic Metabolic Panel:  Recent Labs Lab 02/24/16 0108 02/24/16 0621  NA 127* 128*  K 4.6 4.2  CL 94* 92*  CO2 22 23  GLUCOSE 120* 101*  BUN 32* 32*  CREATININE 1.37* 1.47*  CALCIUM 8.9 8.8*  MG  --  2.1    Liver Function Tests:  Recent Labs Lab 02/24/16 0621  AST 30  ALT 35  ALKPHOS 92  BILITOT 2.3*  PROT 6.1*  ALBUMIN 3.1*   No results for input(s): LIPASE, AMYLASE in the last 168 hours. No results for input(s): AMMONIA in the last 168 hours.  CBC:  Recent Labs Lab 02/24/16 0108 02/24/16 0621  WBC 9.9 8.8  NEUTROABS 6.4 5.4  HGB 16.1 15.9  HCT 48.2 48.0  MCV 94.1 94.9  PLT 175 170    Cardiac Enzymes:  Recent Labs Lab 02/24/16 0621  TROPONINI <0.03    BNP: BNP (last 3 results)  Recent Labs  01/15/16 1605 02/24/16 0108  BNP 599.4* 1,633.0*    ProBNP (last 3 results) No results for input(s): PROBNP in the last 8760 hours.   CBG: No results for input(s): GLUCAP in the last  168 hours.  Coagulation Studies: No results for input(s): LABPROT, INR in the last 72 hours.  Other results: EKG: Afib 115 bpm  Imaging: Ct Angio Chest Pe W And/or Wo Contrast  Result Date: 02/24/2016 CLINICAL DATA:  Hypotension with positive D-dimer. EXAM: CT ANGIOGRAPHY CHEST WITH CONTRAST TECHNIQUE: Multidetector CT imaging of the chest was performed using the standard protocol during bolus administration of intravenous contrast. Multiplanar CT image reconstructions and MIPs were obtained to evaluate the vascular anatomy. CONTRAST:  100 mL Isovue 370 COMPARISON:  None. FINDINGS: Cardiovascular: Large filling defect beginning in the distal right main pulmonary artery and extending into the lower lobe branches. No flow is demonstrated in right lower lobe branches. Appearance is consistent with pulmonary arterial thrombus or embolus. Cardiac enlargement. RV to LV ratio is less than 1, suggesting no evidence of right heart strain. Reflux of contrast material into the hepatic veins is consistent with right heart failure. No pericardial effusion. Calcification of the thoracic aorta and coronary arteries. Mediastinum/Nodes: Scattered lymph nodes are present throughout the mediastinum and in both hilar regions. Right paratracheal lymph nodes measure about 1.8 cm short axis dimension. Appearance is nonspecific. These may be reactive nodes although pathologic lymphadenopathy is not excluded. Lungs/Pleura: Diffuse right-sided pleural thickening with pleural calcifications. Bilateral pleural effusions. Basilar atelectasis bilaterally. Emphysematous changes in the lungs. Right upper lung nodule with mild spiculation measures 7 mm diameter. There is effacement and narrowing of the right lower lobe bronchus with postobstructive consolidation. Suspicion of a a right infrahilar mass measuring 3.4 cm diameter. This could also be causing narrowing of the right lower lobe pulmonary artery, resulting in the thrombosis.  Upper Abdomen: Upper abdominal ascites is present. Enlarged lateral segment of the left lobe of the liver suggests cirrhosis. Diffuse soft tissue edema. Musculoskeletal: No chest wall abnormality. No acute or significant  osseous findings. Review of the MIP images confirms the above findings. IMPRESSION: Thrombosis in the distal right main pulmonary artery and extending into lower lobe branches. No right heart strain. I am suspicious that there is a mass in the right infrahilar region causing narrowing of the right lower lobe bronchus and associated postobstructive change. This mass may also be compressing the right pulmonary artery. Indeterminate 7 mm nodule in the right upper lobe. Indeterminate borderline enlarged hilar and mediastinal lymph nodes. Bilateral pleural effusions with basilar atelectasis. Right-sided pleural thickening and pleural calcifications. Upper abdominal ascites with probable hepatic cirrhosis. These results were called by telephone at the time of interpretation on 02/24/2016 at 3:14 am to Dr. Ross Marcus , who verbally acknowledged these results. Electronically Signed   By: Burman Nieves M.D.   On: 02/24/2016 03:17   Dg Chest Portable 1 View  Result Date: 02/24/2016 CLINICAL DATA:  Chest pain and shortness of breath.  Home oxygen. EXAM: PORTABLE CHEST 1 VIEW COMPARISON:  01/15/2016 FINDINGS: Cardiac pacemaker. Cardiac enlargement without significant pulmonary vascular congestion. Perihilar interstitial changes suggesting edema. Bilateral pleural effusions with basilar atelectasis, greater on the right. Appearances are similar to previous study. No pneumothorax. Mediastinal contours appear intact. IMPRESSION: Cardiac enlargement with interstitial edema, bilateral pleural effusions, and basilar atelectasis, similar to the prior study. Electronically Signed   By: Burman Nieves M.D.   On: 02/24/2016 01:47      Medications:     Current Medications: . aspirin EC  81 mg Oral  Daily  . atorvastatin  20 mg Oral q1800  . budesonide  0.25 mg Inhalation BID  . [START ON 02/25/2016] digoxin  0.25 mg Oral Daily  . furosemide  80 mg Intravenous Q12H  . metoprolol succinate  75 mg Oral BID  . multivitamin with minerals  1 tablet Oral Daily  . omega-3 acid ethyl esters  1 g Oral BID  . pantoprazole  40 mg Oral Daily  . potassium chloride  20 mEq Oral BID     Infusions: . heparin 1,600 Units/hr (02/24/16 0610)      Assessment/Plan   KORTNEY CANTERA is a 66 y.o. male chronic systolic CHF EF 20% due to ICM s/p Medtronic ICD, Atrial fibrillation, CAD s/p 6 stents, obesity, and COPD. Presented to [facility] with worsening SOB, CP, and edema.  Found to have large PE and volume overload. HF team consulted for same.   1. Acute on chronic systolic CHF - Echo 01/16/16 LVEF 20-25% - Markedly volume overloaded on exam.  - Would increase lasix to 80 mg TID for now.  If difficult to diurese will consider PICC for CVP and possible coox.  - Repeat echo pending.  - Continue toprol 75 mg BID for now - Continue digoxin 0.25 mg daily. Level 0.2 this am.  - Will need close follow up on discharge.   2. Large PE by CTA - On heparin drip.  Pulm following 3. CAD - CP likely demand ischemia from HF or possibly 2/2 PE.  - Continue medical therapy for now.  4. COPD - Pulm following 5. Atrial fibrillation - Was unable to get his Xarelto. - Would avoid cardioversion for now with PE and non-compliance with Xarelto. Favor rate control.  - Rate controlled currently. Continue toprol as above.  6. Morbid Obesity - Needs to lose weight. Needs to increase activity and decrease portion size.  7. CKD II - Follow closely with diuresis.   Markedly volume overloaded. We will follow along with you.  Length of Stay: 0  Luane School  02/24/2016, 10:42 AM  Advanced Heart Failure Team Pager [phone] (M-F; 7a - 4p)  Please contact [facility] for night-coverage after hours (4p -7a  ) and weekends on [email or web address]  Patient seen with PA, agree with the above note.    Patient has long history of ischemic cardiomyopathy, EF 25-30% by echo today (consistent with past).  He remains in atrial fibrillation with controlled rate.  He has a substantial PE.  He also has a lung mass concerning for cancer and started back smoking a few days ago.  He came to the hospital because of dyspnea with minimal exertion and swelling.  1. Acute on chronic systolic CHF: Echo this admission with EF 25-30%, mild LV dilation, moderate MR, PASP 39 mmHg, RV moderately dilated/moderately dysfunction.  Ischemic cardiomyopathy, has MDT ICD.  NYHA class IIIb symptoms, marked volume overload on exam.  Exacerbation likely triggered by ongoing atrial fibrillation and RV failure made worse by PE.  - Lasix 80 mg IV every 8 hrs.   - Spironolactone 12.5 daily.  - Can continue current Toprol XL for now.  - Continue digoxin, level ok.  - If creatinine worsens and diuresis poor, may need PICC for CVP/co-ox and possible milrinone.  2. PE: New diagnosis, noted in distal right main.  Trigger may be right infrahilar mass/?lung cancer.   - Heparin gtt for now, pulmonary to see.  Would favor DOAC.  3. Atrial fibrillation: Persistent since 1/18 admission.  Rate is controlled on digoxin and Toprol XL.  Was not able to get Xarelto from the [place], unfortunately it appears that there is an hour long class that has to be attended before Xarelto will be dispensed, and he had not had time to go to the class. In the interim, he had a PE.   - Continue Toprol XL and digoxin for now.  - Heparin gtt for now, would favor DOAC use (hopefully can get this outside the [place] for him).  - Eventually would favor DCCV attempt. However, would not do DCCV at this time with fresh PE.  Would give some time for PE to stabilize/resolve (possible DCCV in a month).  4. Lung mass: Right infrahilar.  Possible lung cancer, pulmonary to see.  He has recently resumed  smoking, needs to quit.  5. Cirrhosis: CT today was concerning for possible hepatic cirrhosis.  6. CKD: Creatinine near baseline (1.3-1.4 over the last month).   Marca Ancona 02/24/2016

## 2016-02-24 NOTE — ED Provider Notes (Signed)
[facility]-EMERGENCY DEPT Provider Note   CSN: [id number] Arrival date & time: 02/24/16  0100  By signing my name below, I, Marnette Burgess Long, attest that this documentation has been prepared under the direction and in the presence of Shon Baton, MD . Electronically Signed: Marnette Burgess Long, Scribe. 02/24/2016. 1:27 AM.    History   Chief Complaint Chief Complaint  Patient presents with  . Chest Pain    The history is provided by the patient and the EMS personnel. No language interpreter was used.    HPI Comments:  Max Rivas is a 66 y.o. male with a PMHx of CHF, CAD, MI x5, GERD, and a Pacemaker insertion, who presents to the Emergency Department by way of EMS complaining of sharp, sudden onset, CP beginning two hours ago. Pt states he was at home when the CP suddenly arose. He notes his pain today is dissimilar from the symptoms felt during his past MI's. 324mg  of ASA was administered en route by EMS. He has associated symptoms of bilateral lower extremity edema, SOB, gradually worsening abdominal pain, He has a h/o angioplasty and ~2-3 stents per MI performed by his cardiologist at the [place]. He notes he has been Rx'd an anti-coagulant For atrial fibrillation but cannot take it until he finishes an education class at [facility]. Pt is on 2.0L of oxygen at home. He states his abdominal pain and generalized edema has worsened over the past two weeks with no alleviation of his symptoms from his Lasix pill. Pt additionally reports he takes his blood pressure medicine everyday at home. He denies cough and any other associated symptoms at this time. Pt is a current every day smoker.  Past Medical History:  Diagnosis Date  . CHF (congestive heart failure) (HCC)   . COPD (chronic obstructive pulmonary disease) (HCC)   . Coronary artery disease   . GERD (gastroesophageal reflux disease)   . Pacemaker     Patient Active Problem List   Diagnosis Date Noted  . Flutter-fibrillation 01/15/2016  .  Acute systolic congestive heart failure (HCC)   . Atrial fibrillation with RVR (HCC)   . DCM (dilated cardiomyopathy) (HCC)   . Coronary artery disease involving native coronary artery of native heart without angina pectoris   . HYPERCHOLESTEROLEMIA 03/11/2006  . TOBACCO DEPENDENCE 03/11/2006  . CORONARY, ARTERIOSCLEROSIS 03/11/2006  . CHF - EJECTION FRACTION < 50% 03/11/2006  . GASTROESOPHAGEAL REFLUX, NO ESOPHAGITIS 03/11/2006  . OSTEOARTHRITIS, MULTI SITES 03/11/2006  . HIGH RISK PATIENT 03/11/2006    Past Surgical History:  Procedure Laterality Date  . CARDIAC CATHETERIZATION     6 stents per patient  . PACEMAKER GENERATOR CHANGE     original PPM around 2004 for ICM with EF < 35%, generator change 03/21/2011 in [facility] Medications    Prior to Admission medications   Medication Sig Start Date End Date Taking? Authorizing Provider  albuterol (PROVENTIL HFA;VENTOLIN HFA) 108 (90 Base) MCG/ACT inhaler Inhale 2 puffs into the lungs every 6 (six) hours as needed for wheezing or shortness of breath.   Yes Historical Provider, MD  aspirin EC 81 MG tablet Take 81 mg by mouth daily.   Yes Historical Provider, MD  atorvastatin (LIPITOR) 20 MG tablet Take 1 tablet (20 mg total) by mouth daily at 6 PM. 01/19/16  Yes Rhetta Mura, MD  digoxin (LANOXIN) 0.25 MG tablet Take 1 tablet (0.25 mg total) by mouth daily. 01/20/16  Yes Rhetta Mura,  MD  furosemide (LASIX) 20 MG tablet Take 2 tablets (40 mg total) by mouth 2 (two) times daily. 01/19/16  Yes Rhetta Mura, MD  metoprolol succinate (TOPROL-XL) 25 MG 24 hr tablet Take 3 tablets (75 mg total) by mouth 2 (two) times daily. 01/19/16  Yes Rhetta Mura, MD  mometasone (ASMANEX) 220 MCG/INH inhaler Inhale 2 puffs into the lungs daily.   Yes Historical Provider, MD  Multiple Vitamin (MULTIVITAMIN WITH MINERALS) TABS tablet Take 1 tablet by mouth daily.   Yes Historical Provider, MD  Omega-3 Fatty Acids (SUPER OMEGA  3 EPA/DHA) 1000 MG CAPS Take 1 capsule by mouth 2 (two) times daily.   Yes Historical Provider, MD  omeprazole (PRILOSEC) 20 MG capsule Take 40 mg by mouth daily.   Yes Historical Provider, MD  rivaroxaban (XARELTO) 20 MG TABS tablet Take 1 tablet (20 mg total) by mouth daily with supper. 01/19/16  Yes Rhetta Mura, MD  Tiotropium Bromide-Olodaterol (STIOLTO RESPIMAT) 2.5-2.5 MCG/ACT AERS Inhale 2 puffs into the lungs daily.   Yes Historical Provider, MD    Family History Family History  Problem Relation Age of Onset  . Heart attack Mother   . Heart attack Father   . Heart attack Sister 72    Social History Social History  Substance Use Topics  . Smoking status: Current Some Day Smoker    Packs/day: 0.50    Types: Cigarettes  . Smokeless tobacco: Never Used  . Alcohol use No     Allergies   Crestor [rosuvastatin calcium]   Review of Systems Review of Systems  Constitutional: Positive for fever (resolved).  Respiratory: Positive for shortness of breath. Negative for cough.   Cardiovascular: Positive for chest pain and leg swelling.  Gastrointestinal: Positive for abdominal distention and abdominal pain.  All other systems reviewed and are negative.    Physical Exam Updated Vital Signs BP 105/79   Pulse 110   Temp 97.6 F (36.4 C) (Oral)   Resp 22   SpO2 99%   Physical Exam  Constitutional: He is oriented to person, place, and time.  Chronically ill-appearing, morbidly obese  HENT:  Head: Normocephalic and atraumatic.  Eyes: Pupils are equal, round, and reactive to light.  Cardiovascular: Regular rhythm and normal heart sounds.   No murmur heard. Tachycardia  Pulmonary/Chest: Effort normal and breath sounds normal. No respiratory distress. He has no wheezes.  Limited by body habitus, crackles bilateral bases, no wheezing noted nasal cannula in place Pacemaker palpated left upper chest  Abdominal: Soft. Bowel sounds are normal. There is tenderness. There  is no rebound.  Diffuse tenderness with edema and anasarca noted  Musculoskeletal: He exhibits edema.  3+ bilateral pitting and weeping edema  Neurological: He is alert and oriented to person, place, and time.  Skin: Skin is warm and dry.  Psychiatric: He has a normal mood and affect.  Nursing note and vitals reviewed.    ED Treatments / Results  DIAGNOSTIC STUDIES:  Oxygen Saturation is 93% on [place] 3.5L, low by my interpretation.    COORDINATION OF CARE:  1:26 AM Discussed treatment plan with pt at bedside including CT chest and pt agreed to plan.  Labs (all labs ordered are listed, but only abnormal results are displayed) Labs Reviewed  CBC WITH DIFFERENTIAL/PLATELET - Abnormal; Notable for the following:       Result Value   RDW 16.2 (*)    Monocytes Absolute 1.1 (*)    All other components within normal limits  BASIC METABOLIC  PANEL - Abnormal; Notable for the following:    Sodium 127 (*)    Chloride 94 (*)    Glucose, Bld 120 (*)    BUN 32 (*)    Creatinine, Ser 1.37 (*)    GFR calc non Af Amer 53 (*)    All other components within normal limits  BRAIN NATRIURETIC PEPTIDE - Abnormal; Notable for the following:    B Natriuretic Peptide 1,633.0 (*)    All other components within normal limits  D-DIMER, QUANTITATIVE (NOT AT [facility]) - Abnormal; Notable for the following:    D-Dimer, Quant 3.99 (*)    All other components within normal limits  I-STAT TROPOININ, ED    EKG  EKG Interpretation  Date/Time:  Monday February 24 2016 01:04:08 EST Ventricular Rate:  115 PR Interval:    QRS Duration: 95 QT Interval:  354 QTC Calculation: 455 R Axis:   18 Text Interpretation:  Atrial fibrillation Low voltage, extremity and precordial leads Consider anterior infarct Minimal ST depression, inferior leads Confirmed by Payzlee Ryder  MD, Marcoantonio Legault ([id number]) on 02/24/2016 1:06:54 AM       Radiology Ct Angio Chest Pe W And/or Wo Contrast  Result Date: 02/24/2016 CLINICAL DATA:   Hypotension with positive D-dimer. EXAM: CT ANGIOGRAPHY CHEST WITH CONTRAST TECHNIQUE: Multidetector CT imaging of the chest was performed using the standard protocol during bolus administration of intravenous contrast. Multiplanar CT image reconstructions and MIPs were obtained to evaluate the vascular anatomy. CONTRAST:  100 mL Isovue 370 COMPARISON:  None. FINDINGS: Cardiovascular: Large filling defect beginning in the distal right main pulmonary artery and extending into the lower lobe branches. No flow is demonstrated in right lower lobe branches. Appearance is consistent with pulmonary arterial thrombus or embolus. Cardiac enlargement. RV to LV ratio is less than 1, suggesting no evidence of right heart strain. Reflux of contrast material into the hepatic veins is consistent with right heart failure. No pericardial effusion. Calcification of the thoracic aorta and coronary arteries. Mediastinum/Nodes: Scattered lymph nodes are present throughout the mediastinum and in both hilar regions. Right paratracheal lymph nodes measure about 1.8 cm short axis dimension. Appearance is nonspecific. These may be reactive nodes although pathologic lymphadenopathy is not excluded. Lungs/Pleura: Diffuse right-sided pleural thickening with pleural calcifications. Bilateral pleural effusions. Basilar atelectasis bilaterally. Emphysematous changes in the lungs. Right upper lung nodule with mild spiculation measures 7 mm diameter. There is effacement and narrowing of the right lower lobe bronchus with postobstructive consolidation. Suspicion of a a right infrahilar mass measuring 3.4 cm diameter. This could also be causing narrowing of the right lower lobe pulmonary artery, resulting in the thrombosis. Upper Abdomen: Upper abdominal ascites is present. Enlarged lateral segment of the left lobe of the liver suggests cirrhosis. Diffuse soft tissue edema. Musculoskeletal: No chest wall abnormality. No acute or significant osseous  findings. Review of the MIP images confirms the above findings. IMPRESSION: Thrombosis in the distal right main pulmonary artery and extending into lower lobe branches. No right heart strain. I am suspicious that there is a mass in the right infrahilar region causing narrowing of the right lower lobe bronchus and associated postobstructive change. This mass may also be compressing the right pulmonary artery. Indeterminate 7 mm nodule in the right upper lobe. Indeterminate borderline enlarged hilar and mediastinal lymph nodes. Bilateral pleural effusions with basilar atelectasis. Right-sided pleural thickening and pleural calcifications. Upper abdominal ascites with probable hepatic cirrhosis. These results were called by telephone at the time of interpretation on 02/24/2016 at  3:14 am to Dr. Ross Marcus , who verbally acknowledged these results. Electronically Signed   By: Burman Nieves M.D.   On: 02/24/2016 03:17   Dg Chest Portable 1 View  Result Date: 02/24/2016 CLINICAL DATA:  Chest pain and shortness of breath.  Home oxygen. EXAM: PORTABLE CHEST 1 VIEW COMPARISON:  01/15/2016 FINDINGS: Cardiac pacemaker. Cardiac enlargement without significant pulmonary vascular congestion. Perihilar interstitial changes suggesting edema. Bilateral pleural effusions with basilar atelectasis, greater on the right. Appearances are similar to previous study. No pneumothorax. Mediastinal contours appear intact. IMPRESSION: Cardiac enlargement with interstitial edema, bilateral pleural effusions, and basilar atelectasis, similar to the prior study. Electronically Signed   By: Burman Nieves M.D.   On: 02/24/2016 01:47    Procedures Procedures (including critical care time)  Medications Ordered in ED Medications  furosemide (LASIX) 100 mg in dextrose 5 % 50 mL IVPB (100 mg Intravenous New Bag/Given 02/24/16 0347)  iopamidol (ISOVUE-370) 76 % injection (100 mLs  Contrast Given 02/24/16 0236)     Initial  Impression / Assessment and Plan / ED Course  I have reviewed the triage vital signs and the nursing notes.  Pertinent labs & imaging results that were available during my care of the patient were reviewed by me and considered in my medical decision making (see chart for details).     Patient presents with chest pain. History of MI. Also history of CHF. He is floridly volume overloaded on exam. He is in no acute respiratory distress. Chest pain has since resolved. He is tachycardic and blood pressure is soft. While he appears volume overloaded, feel his hypotension is out of proportion to his exam. We'll add a d-dimer as well. Chest x-ray does show pleural effusions. BNP is elevated. Troponin is negative. EKG does not show any acute ischemia.  D-dimer is elevated. CT obtained and is concerning for an arterial thrombosis. I discussed with the radiologist. He is concerned that there is a mass causing compression. Lab work notable for hyponatremia. This could be related to SIADH. Given for volume overloaded on exam, patient was dosed 100 mg of Lasix. He will need close monitoring for diuresis and further evaluation for possible mass. I discussed with Dr. Toniann Fail.  He will evaluate the patient. I discussed with him initiating a heparin drip for both thrombosis and atrial fibrillation. He will discuss with pulmonology and make a decision.    Final Clinical Impressions(s) / ED Diagnoses   Final diagnoses:  Acute on chronic systolic congestive heart failure (HCC)  Pulmonary artery thrombosis (HCC)    New Prescriptions New Prescriptions   No medications on file   I personally performed the services described in this documentation, which was scribed in my presence. The recorded information has been reviewed and is accurate.     Shon Baton, MD 02/24/16 [phone]

## 2016-02-24 NOTE — ED Notes (Signed)
PACEMAKER: Med[employer]onic Protecta XT VR [id number] (ICD) Device SN: [id number] Implant Date: 03/19/2011

## 2016-02-24 NOTE — H&P (Addendum)
History and Physical    Max Rivas ZOX:[id number] DOB: [birth year] DOA: 02/24/2016  PCP: Jyl Heinz, MD  Patient coming from: Home.  Chief Complaint: Shortness of breath.  HPI: Max Rivas is a 66 y.o. male with history of CAD status post stenting, recently diagnosed atrial fibrillation admitted last month for decompensated CHF presents to the ER because of worsening shortness of breath increasing peripheral edema. Patient states that over the last 2 days patient has been having increasing shortness of breath even at rest unable to lie flat with chest pain off and on the left side. Pain last few minutes each time and improves on resting. Sharp shooting pain. Denies any associated productive cough fever or chills. On exam patient has significant peripheral edema extending up to the abdomen. 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 Patient states he was prescribed xarelto but still has not taken the medication since he has to attend a class at [place] before obtaining the medication. Patient states he was otherwise compliant with Lasix.  ED Course: EKG shows A. fib with RVR. Since patient has significant edema Lasix 100 mg IV was given.  Review of Systems: As per HPI, rest all negative.   Past Medical History:  Diagnosis Date  . CHF (congestive heart failure) (HCC)   . COPD (chronic obstructive pulmonary disease) (HCC)   . Coronary artery disease   . GERD (gastroesophageal reflux disease)   . Pacemaker     Past Surgical History:  Procedure Laterality Date  . CARDIAC CATHETERIZATION     6 stents per patient  . PACEMAKER GENERATOR CHANGE     original PPM around 2004 for ICM with EF < 35%, generator change 03/21/2011 in [place]     reports that he has been smoking Cigarettes.  He has been smoking about 0.50 packs per day. He has never used smokeless tobacco. He  reports that he does not drink alcohol or use drugs.  Allergies  Allergen Reactions  . Crestor [Rosuvastatin Calcium] Other (See Comments)    Back spasms    Family History  Problem Relation Age of Onset  . Heart attack Mother   . Heart attack Father   . Heart attack Sister 28    Prior to Admission medications   Medication Sig Start Date End Date Taking? Authorizing Provider  albuterol (PROVENTIL HFA;VENTOLIN HFA) 108 (90 Base) MCG/ACT inhaler Inhale 2 puffs into the lungs every 6 (six) hours as needed for wheezing or shortness of breath.   Yes Historical Provider, MD  aspirin EC 81 MG tablet Take 81 mg by mouth daily.   Yes Historical Provider, MD  atorvastatin (LIPITOR) 20 MG tablet Take 1 tablet (20 mg total) by mouth daily at 6 PM. 01/19/16  Yes Rhetta Mura, MD  digoxin (LANOXIN) 0.25 MG tablet Take 1 tablet (0.25 mg total) by mouth daily. 01/20/16  Yes Rhetta Mura, MD  furosemide (LASIX) 20 MG tablet Take 2 tablets (40 mg total) by mouth 2 (two) times daily. 01/19/16  Yes Rhetta Mura, MD  metoprolol succinate (TOPROL-XL) 25 MG 24 hr tablet Take 3 tablets (75 mg total) by mouth 2 (two) times daily. 01/19/16  Yes Rhetta Mura, MD  mometasone (ASMANEX) 220 MCG/INH inhaler Inhale 2 puffs into the lungs daily.   Yes Historical Provider, MD  Multiple Vitamin (MULTIVITAMIN WITH MINERALS) TABS tablet Take 1 tablet by mouth daily.  Yes Historical Provider, MD  Omega-3 Fatty Acids (SUPER OMEGA 3 EPA/DHA) 1000 MG CAPS Take 1 capsule by mouth 2 (two) times daily.   Yes Historical Provider, MD  omeprazole (PRILOSEC) 20 MG capsule Take 40 mg by mouth daily.   Yes Historical Provider, MD  Tiotropium Bromide-Olodaterol (STIOLTO RESPIMAT) 2.5-2.5 MCG/ACT AERS Inhale 2 puffs into the lungs daily.   Yes Historical Provider, MD  rivaroxaban (XARELTO) 20 MG TABS tablet Take 1 tablet (20 mg total) by mouth daily with supper. 01/19/16   Rhetta Mura, MD    Physical  Exam: Vitals:   02/24/16 0515 02/24/16 0530 02/24/16 0545 02/24/16 0600  BP: 121/83 111/80 119/79 104/86  Pulse: 100 (!) 130 110 96  Resp: 20 21 17 21   Temp:      TempSrc:      SpO2: 95% 98% 98% 96%      Constitutional: Moderately built and nourished. Vitals:   02/24/16 0515 02/24/16 0530 02/24/16 0545 02/24/16 0600  BP: 121/83 111/80 119/79 104/86  Pulse: 100 (!) 130 110 96  Resp: 20 21 17 21   Temp:      TempSrc:      SpO2: 95% 98% 98% 96%   Eyes: Anicteric. No pallor. ENMT: No discharge from the ears eyes nose and mouth. Neck: No mass felt. JVD elevated. Respiratory: No rhonchi or crepitations. Cardiovascular: S1 and S2 heard no murmurs appreciated. Abdomen: Soft nontender bowel sounds present. No guarding or rigidity. Musculoskeletal: Bilateral lower extremity edema extending up to the abdomen. Skin: No rash. Skin appears warm. Neurologic: Alert awake oriented to time place and person. Moves all extremities. Psychiatric: Appears normal. Normal affect.   Labs on Admission: I have personally reviewed following labs and imaging studies  CBC:  Recent Labs Lab 02/24/16 0108  WBC 9.9  NEUTROABS 6.4  HGB 16.1  HCT 48.2  MCV 94.1  PLT 175   Basic Metabolic Panel:  Recent Labs Lab 02/24/16 0108  NA 127*  K 4.6  CL 94*  CO2 22  GLUCOSE 120*  BUN 32*  CREATININE 1.37*  CALCIUM 8.9   GFR: CrCl cannot be calculated (Unknown ideal weight.). Liver Function Tests: No results for input(s): AST, ALT, ALKPHOS, BILITOT, PROT, ALBUMIN in the last 168 hours. No results for input(s): LIPASE, AMYLASE in the last 168 hours. No results for input(s): AMMONIA in the last 168 hours. Coagulation Profile: No results for input(s): INR, PROTIME in the last 168 hours. Cardiac Enzymes: No results for input(s): CKTOTAL, CKMB, CKMBINDEX, TROPONINI in the last 168 hours. BNP (last 3 results) No results for input(s): PROBNP in the last 8760 hours. HbA1C: No results for  input(s): HGBA1C in the last 72 hours. CBG: No results for input(s): GLUCAP in the last 168 hours. Lipid Profile: No results for input(s): CHOL, HDL, LDLCALC, TRIG, CHOLHDL, LDLDIRECT in the last 72 hours. Thyroid Function Tests: No results for input(s): TSH, T4TOTAL, FREET4, T3FREE, THYROIDAB in the last 72 hours. Anemia Panel: No results for input(s): VITAMINB12, FOLATE, FERRITIN, TIBC, IRON, RETICCTPCT in the last 72 hours. Urine analysis: No results found for: COLORURINE, APPEARANCEUR, LABSPEC, PHURINE, GLUCOSEU, HGBUR, BILIRUBINUR, KETONESUR, PROTEINUR, UROBILINOGEN, NITRITE, LEUKOCYTESUR Sepsis Labs: @LABRCNTIP (procalcitonin:4,lacticidven:4) )No results found for this or any previous visit (from the past 240 hour(s)).   Radiological Exams on Admission: Ct Angio Chest Pe W And/or Wo Contrast  Result Date: 02/24/2016 CLINICAL DATA:  Hypotension with positive D-dimer. EXAM: CT ANGIOGRAPHY CHEST WITH CONTRAST TECHNIQUE: Multidetector CT imaging of the chest was performed using the  standard protocol during bolus administration of intravenous contrast. Multiplanar CT image reconstructions and MIPs were obtained to evaluate the vascular anatomy. CONTRAST:  100 mL Isovue 370 COMPARISON:  None. FINDINGS: Cardiovascular: Large filling defect beginning in the distal right main pulmonary artery and extending into the lower lobe branches. No flow is demonstrated in right lower lobe branches. Appearance is consistent with pulmonary arterial thrombus or embolus. Cardiac enlargement. RV to LV ratio is less than 1, suggesting no evidence of right heart strain. Reflux of contrast material into the hepatic veins is consistent with right heart failure. No pericardial effusion. Calcification of the thoracic aorta and coronary arteries. Mediastinum/Nodes: Scattered lymph nodes are present throughout the mediastinum and in both hilar regions. Right paratracheal lymph nodes measure about 1.8 cm short axis dimension.  Appearance is nonspecific. These may be reactive nodes although pathologic lymphadenopathy is not excluded. Lungs/Pleura: Diffuse right-sided pleural thickening with pleural calcifications. Bilateral pleural effusions. Basilar atelectasis bilaterally. Emphysematous changes in the lungs. Right upper lung nodule with mild spiculation measures 7 mm diameter. There is effacement and narrowing of the right lower lobe bronchus with postobstructive consolidation. Suspicion of a a right infrahilar mass measuring 3.4 cm diameter. This could also be causing narrowing of the right lower lobe pulmonary artery, resulting in the thrombosis. Upper Abdomen: Upper abdominal ascites is present. Enlarged lateral segment of the left lobe of the liver suggests cirrhosis. Diffuse soft tissue edema. Musculoskeletal: No chest wall abnormality. No acute or significant osseous findings. Review of the MIP images confirms the above findings. IMPRESSION: Thrombosis in the distal right main pulmonary artery and extending into lower lobe branches. No right heart strain. I am suspicious that there is a mass in the right infrahilar region causing narrowing of the right lower lobe bronchus and associated postobstructive change. This mass may also be compressing the right pulmonary artery. Indeterminate 7 mm nodule in the right upper lobe. Indeterminate borderline enlarged hilar and mediastinal lymph nodes. Bilateral pleural effusions with basilar atelectasis. Right-sided pleural thickening and pleural calcifications. Upper abdominal ascites with probable hepatic cirrhosis. These results were called by telephone at the time of interpretation on 02/24/2016 at 3:14 am to Dr. Ross Marcus , who verbally acknowledged these results. Electronically Signed   By: Burman Nieves M.D.   On: 02/24/2016 03:17   Dg Chest Portable 1 View  Result Date: 02/24/2016 CLINICAL DATA:  Chest pain and shortness of breath.  Home oxygen. EXAM: PORTABLE CHEST 1 VIEW  COMPARISON:  01/15/2016 FINDINGS: Cardiac pacemaker. Cardiac enlargement without significant pulmonary vascular congestion. Perihilar interstitial changes suggesting edema. Bilateral pleural effusions with basilar atelectasis, greater on the right. Appearances are similar to previous study. No pneumothorax. Mediastinal contours appear intact. IMPRESSION: Cardiac enlargement with interstitial edema, bilateral pleural effusions, and basilar atelectasis, similar to the prior study. Electronically Signed   By: Burman Nieves M.D.   On: 02/24/2016 01:47    EKG: Independently reviewed. A. fib with RVR.  Assessment/Plan Principal Problem:   Acute respiratory failure with hypoxia (HCC) Active Problems:   Acute systolic congestive heart failure (HCC)   Atrial fibrillation with RVR (HCC)   DCM (dilated cardiomyopathy) (HCC)   Coronary artery disease involving native coronary artery of native heart without angina pectoris   Pulmonary embolus (HCC)    1. Acute respiratory failure with hypoxia secondary to acute on chronic systolic heart failure last EF measured in 01/16/2016 was 20-25% - patient is started on Lasix 80 mg every 12 IV. Patient has already received 100  mg IV. Patient probably will need increased dose but will need to monitor blood pressure closely. Since patient also has occasional chest pain on exertion will cycle cardiac markers. Follow daily metabolic panel intake output and daily weights. 2. Right-sided pulmonary thrombus with possible lung mass - I have discussed with Dr. Delton Coombes on-call pulmonologist who will be seeing patient in consult and Dr. Delton Coombes has requested heparin infusion which has been started. I have ordered repeat 2-D echo secondary to embolus and worsening edema. 3. Atrial fibrillation with RVR - last time I examined the patient, patient's heart rate has improved to around 100/m. Patient is on digoxin and Toprol which will be continued. Check digoxin level. 4. CAD status post  stenting - since patient has chest pain on exertion will cycle cardiac markers. Patient is on heparin. Continue metoprolol and aspirin and statin. 5. Chronic kidney disease stage II - creatinine appears to be at baseline. 6. History of sleep apnea noncompliant with CPAP.   DVT prophylaxis: Heparin. Code Status: DO NOT RESUSCITATE.  Family Communication: Discussed with patient.  Disposition Plan: Home.  Consults called: Pulmonary and cardiology.  Admission status: Inpatient.    Max Clos MD [facility] Pager [phone].  If 7PM-7AM, please contact night-coverage [email or web address] Password [facility]  02/24/2016, 6:15 AM

## 2016-02-24 NOTE — ED Notes (Signed)
Patient transported to CT with transporter 

## 2016-02-24 NOTE — ED Notes (Signed)
Attempted report to floor x 1 

## 2016-02-24 NOTE — Progress Notes (Signed)
Patient seen and examined   66 y.o. male with history of CHF, CAD, MI x5, GERD, and a Pacemaker insertion,CAD status post stenting, recently diagnosed atrial fibrillation admitted last month for decompensated CHF presents to the ER because of worsening shortness of breath increasing peripheral edema.CT angiogram of the chest was done which shows right pulmonary artery thrombus with possible lung mass and bilateral pleural effusion.Inititiated  heparin drip for both thrombosis and atrial fibrillation    Assessment/Plan  1. Acute respiratory failure with hypoxia secondary to acute on chronic systolic heart failure last EF measured in 01/16/2016 was 20-25% - patient is started on Lasix 80 mg every 12 IV.   Patient probably will need increased dose but will need to monitor blood pressure closely. Since patient also has occasional chest pain on exertion will cycle cardiac markers. Cardiology has been consulted for A. fib with RVR, chest pain. Follow daily metabolic panel intake output and daily weights. 2. Right-sided pulmonary thrombus with possible lung mass -   discussed with Dr. Delton Coombes on-call pulmonologist who will be seeing patient in consult and Dr. Delton Coombes has requested heparin infusion  . ordered repeat 2-D echo secondary to embolus and worsening edema. 3. Atrial fibrillation with RVR - l  patient's heart rate has improved to around 100/m. Patient is on digoxin and Toprol which will be continued.  digoxin level <0.2. We will give 1 dose of digoxin 0.25 mg IV 1 4. CAD status post stenting - since patient has chest pain on exertion will cycle cardiac markers. Patient is only on heparin. Continue metoprolol and aspirin and statin. 5. Chronic kidney disease stage II - creatinine appears to be at baseline of 1.4. 6. History of sleep apnea noncompliant with CPAP. 7. . Acute on chronic systolic CHF - Echo 01/16/16 LVEF 20-25%,- Markedly volume overloaded on exam. - Would increase lasix to 80 mg TID for now.   If difficult to diurese will consider PICC for CVP and possible coox. - Repeat echo pending. - Continue toprol 75 mg BID for now,- Continue digoxin 0.25 mg daily. Level 0.2 this am. - ,

## 2016-02-24 NOTE — ED Notes (Signed)
RN called main lab to add on D-Dimer 

## 2016-02-24 NOTE — ED Notes (Signed)
ED Provider at bedside. 

## 2016-02-24 NOTE — ED Notes (Signed)
Pt reports genitals and ABD are swollen.

## 2016-02-24 NOTE — Progress Notes (Signed)
ANTICOAGULATION CONSULT NOTE - Initial Consult  Pharmacy Consult for Heparin Indication: pulmonary embolus  Allergies  Allergen Reactions  . Crestor [Rosuvastatin Calcium] Other (See Comments)    Back spasms    Patient Measurements:    Ht: 71 in Wt: 109 kg  IBW: 75 kg Heparin Dosing Weight: 99 klg  Vital Signs: Temp: 97.6 F (36.4 C) (02/12 0117) Temp Source: Oral (02/12 0117) BP: 105/79 (02/12 0400) Pulse Rate: 110 (02/12 0400)  Labs:  Recent Labs  02/24/16 0108  HGB 16.1  HCT 48.2  PLT 175  CREATININE 1.37*    CrCl cannot be calculated (Unknown ideal weight.).   Medical History: Past Medical History:  Diagnosis Date  . CHF (congestive heart failure) (HCC)   . COPD (chronic obstructive pulmonary disease) (HCC)   . Coronary artery disease   . GERD (gastroesophageal reflux disease)   . Pacemaker     Medications:  See electronic med rec  Assessment: 66 y.o. M presents with CP. CT shows thrombosis in the distal right main pulmonary artery and extending into lower lobe branches. No right heart strain. Pt notes he has been prescribed Xarelto for atrial fibrillation but cannot take it until he finishes an education class at the [place]. To start heparin gtt. CBC ok on admission.  Goal of Therapy:  Heparin level 0.3-0.7 units/ml Monitor platelets by anticoagulation protocol: Yes   Plan:  Heparin IV bolus 6000 units Heparin gtt at 1600 units/hr Will f/u heparin level in 6 hours Daily heparin level and CBC  Christoper Fabian, PharmD, BCPS Clinical pharmacist, pager [phone] 02/24/2016,5:34 AM

## 2016-02-24 NOTE — ED Triage Notes (Signed)
Pt arrived to ED via EMS. Awaken by chest pain with SOB. Placed self on home O2 at 2L. No relief with SOB. Chest pain originally reported at 7/10. EMS increased O2 to 4L. Chest pain improved per pt. Reported 2/10. Hx of CHF, MI x4. Lower bilateral edema present in extremities.Abdomen distended. 324 mg of aspirin provided by EMS.

## 2016-02-24 NOTE — Progress Notes (Signed)
Patient arrived to 2W room 13.  Telemetry monitor was applied and CCMD notified.  Patient oriented to unit and room to include call light and phone.  Will continue to monitor.

## 2016-02-24 NOTE — Consult Note (Signed)
Name: Max Rivas MRN: [id number] DOB: 1950/12/22    ADMISSION DATE:  02/24/2016 CONSULTATION DATE:  02/24/16  REFERRING MD :  Dr. Susie Cassette   CHIEF COMPLAINT:  Shortness of Breath    HISTORY OF PRESENT ILLNESS:  66 y/o M, current smoker, who presented to [facility] on 2/12 with a 48 hour history of shortness of breath.    The patient was recently admitted 1/3-01/19/16 for a CHF decompensation and new dx of AF dischared on Xarelto (has not taken as he was to attend a class at the [place] before he could fill the Rx) and O2.  He reported on admit that he woke up with chest pain and shortness of breath.  He put his home O2 on at 2L without relief.  He initially reported his pain as 7/10.  He also noted he had been having abdominal pain and generalized swelling over the last two weeks without relief from lasix.  He endorses that he has been taking his blood pressure medication as prescribed.  The patient was admitted per [facility] for further assessment.  CXR demonstrated cardiomegaly with interstitial edema, bilateral pleural effusions and basilar atelectasis.  The patient had hypotension and an elevated d-Dimer at 3.99.  Follow up CTA of the chest was obtained which demonstrated thrombosis in the distal right main pulmonary artery extending into lower lobe branches, no right heart strain, concern for possible mass in the right infrahilar region causing narrowing of the right lower lobe bronchus with post-obstructive changes, right sided pleural thickening, upper abdominal ascites with probable hepatic cirrhosis.  Labs - Na 128, K 4.2, Cl 92, Sr Cr 1.47, AG 13, Albumin 3.1, troponin 0.03, BNP 1633, WBC 8.8, Hgb 15.9 and platelets 170.    He has a known medical hx of CHF, CAD s/p MIx5, Pacemaker, COPD and GERD.    PCCM consulted for evaluation of SOB, abnormal CT findings.   PAST MEDICAL HISTORY :   has a past medical history of CHF (congestive heart failure) (HCC); COPD (chronic obstructive pulmonary disease) (HCC);  Coronary artery disease; GERD (gastroesophageal reflux disease); and Pacemaker.   has a past surgical history that includes Pacemaker generator change and Cardiac catheterization.  Prior to Admission medications   Medication Sig Start Date End Date Taking? Authorizing Provider  albuterol (PROVENTIL HFA;VENTOLIN HFA) 108 (90 Base) MCG/ACT inhaler Inhale 2 puffs into the lungs every 6 (six) hours as needed for wheezing or shortness of breath.   Yes Historical Provider, MD  aspirin EC 81 MG tablet Take 81 mg by mouth daily.   Yes Historical Provider, MD  atorvastatin (LIPITOR) 20 MG tablet Take 1 tablet (20 mg total) by mouth daily at 6 PM. 01/19/16  Yes Rhetta Mura, MD  digoxin (LANOXIN) 0.25 MG tablet Take 1 tablet (0.25 mg total) by mouth daily. 01/20/16  Yes Rhetta Mura, MD  furosemide (LASIX) 20 MG tablet Take 2 tablets (40 mg total) by mouth 2 (two) times daily. 01/19/16  Yes Rhetta Mura, MD  metoprolol succinate (TOPROL-XL) 25 MG 24 hr tablet Take 3 tablets (75 mg total) by mouth 2 (two) times daily. 01/19/16  Yes Rhetta Mura, MD  mometasone (ASMANEX) 220 MCG/INH inhaler Inhale 2 puffs into the lungs daily.   Yes Historical Provider, MD  Multiple Vitamin (MULTIVITAMIN WITH MINERALS) TABS tablet Take 1 tablet by mouth daily.   Yes Historical Provider, MD  Omega-3 Fatty Acids (SUPER OMEGA 3 EPA/DHA) 1000 MG CAPS Take 1 capsule by mouth 2 (two) times daily.  Yes Historical Provider, MD  omeprazole (PRILOSEC) 20 MG capsule Take 40 mg by mouth daily.   Yes Historical Provider, MD  Tiotropium Bromide-Olodaterol (STIOLTO RESPIMAT) 2.5-2.5 MCG/ACT AERS Inhale 2 puffs into the lungs daily.   Yes Historical Provider, MD  rivaroxaban (XARELTO) 20 MG TABS tablet Take 1 tablet (20 mg total) by mouth daily with supper. 01/19/16   Rhetta Mura, MD    Allergies  Allergen Reactions  . Crestor [Rosuvastatin Calcium] Other (See Comments)    Back spasms    FAMILY HISTORY:    family history includes Heart attack in his father and mother; Heart attack (age of onset: 1) in his sister.  SOCIAL HISTORY:  reports that he has been smoking Cigarettes.  He has been smoking about 0.50 packs per day. He has never used smokeless tobacco. He reports that he does not drink alcohol or use drugs.  REVIEW OF SYSTEMS:  POSITIVES IN BOLD Constitutional: Negative for fever, chills, weight loss, malaise/fatigue and diaphoresis.  HENT: Negative for hearing loss, ear pain, nosebleeds, congestion, sore throat, neck pain, tinnitus and ear discharge.   Eyes: Negative for blurred vision, double vision, photophobia, pain, discharge and redness.  Respiratory: Negative for cough, hemoptysis, sputum production, shortness of breath, wheezing and stridor.   Cardiovascular: Negative for chest pain, palpitations, orthopnea, claudication, leg swelling and PND.  Gastrointestinal: Negative for heartburn, nausea, vomiting, abdominal pain, diarrhea, constipation, blood in stool and melena.  Genitourinary: Negative for dysuria, urgency, frequency, hematuria and flank pain.  Musculoskeletal: Negative for myalgias, back pain, joint pain and falls.  Skin: Negative for itching and rash.  Neurological: Negative for dizziness, tingling, tremors, sensory change, speech change, focal weakness, seizures, loss of consciousness, weakness and headaches.  Endo/Heme/Allergies: Negative for environmental allergies and polydipsia. Does not bruise/bleed easily.  SUBJECTIVE:   VITAL SIGNS: Temp:  [97.6 F (36.4 C)] 97.6 F (36.4 C) (02/12 0117) Pulse Rate:  [50-130] 92 (02/12 1330) Resp:  [10-26] 17 (02/12 1330) BP: (90-124)/(57-96) 124/83 (02/12 1330) SpO2:  [93 %-99 %] 97 % (02/12 1330)  PHYSICAL EXAMINATION: General:  Chronically ill appearing patient, NAD Neuro:  [place]/AT, PERRL, EOM-I and MMM HEENT:  [place]/AT, PERRL, EOM-I and MMM Cardiovascular:  RRR, Nl S1/S2, -M/R/G. Lungs:  Distant BS but clear  bilaterally. Abdomen:  Soft, NT, ND and +BS Musculoskeletal:  -edema and -tenderness Skin:  Intact   Recent Labs Lab 02/24/16 0108 02/24/16 0621  NA 127* 128*  K 4.6 4.2  CL 94* 92*  CO2 22 23  BUN 32* 32*  CREATININE 1.37* 1.47*  GLUCOSE 120* 101*     Recent Labs Lab 02/24/16 0108 02/24/16 0621  HGB 16.1 15.9  HCT 48.2 48.0  WBC 9.9 8.8  PLT 175 170    Ct Angio Chest Pe W And/or Wo Contrast  Result Date: 02/24/2016 CLINICAL DATA:  Hypotension with positive D-dimer. EXAM: CT ANGIOGRAPHY CHEST WITH CONTRAST TECHNIQUE: Multidetector CT imaging of the chest was performed using the standard protocol during bolus administration of intravenous contrast. Multiplanar CT image reconstructions and MIPs were obtained to evaluate the vascular anatomy. CONTRAST:  100 mL Isovue 370 COMPARISON:  None. FINDINGS: Cardiovascular: Large filling defect beginning in the distal right main pulmonary artery and extending into the lower lobe branches. No flow is demonstrated in right lower lobe branches. Appearance is consistent with pulmonary arterial thrombus or embolus. Cardiac enlargement. RV to LV ratio is less than 1, suggesting no evidence of right heart strain. Reflux of contrast material into the hepatic  veins is consistent with right heart failure. No pericardial effusion. Calcification of the thoracic aorta and coronary arteries. Mediastinum/Nodes: Scattered lymph nodes are present throughout the mediastinum and in both hilar regions. Right paratracheal lymph nodes measure about 1.8 cm short axis dimension. Appearance is nonspecific. These may be reactive nodes although pathologic lymphadenopathy is not excluded. Lungs/Pleura: Diffuse right-sided pleural thickening with pleural calcifications. Bilateral pleural effusions. Basilar atelectasis bilaterally. Emphysematous changes in the lungs. Right upper lung nodule with mild spiculation measures 7 mm diameter. There is effacement and narrowing of the  right lower lobe bronchus with postobstructive consolidation. Suspicion of a a right infrahilar mass measuring 3.4 cm diameter. This could also be causing narrowing of the right lower lobe pulmonary artery, resulting in the thrombosis. Upper Abdomen: Upper abdominal ascites is present. Enlarged lateral segment of the left lobe of the liver suggests cirrhosis. Diffuse soft tissue edema. Musculoskeletal: No chest wall abnormality. No acute or significant osseous findings. Review of the MIP images confirms the above findings. IMPRESSION: Thrombosis in the distal right main pulmonary artery and extending into lower lobe branches. No right heart strain. I am suspicious that there is a mass in the right infrahilar region causing narrowing of the right lower lobe bronchus and associated postobstructive change. This mass may also be compressing the right pulmonary artery. Indeterminate 7 mm nodule in the right upper lobe. Indeterminate borderline enlarged hilar and mediastinal lymph nodes. Bilateral pleural effusions with basilar atelectasis. Right-sided pleural thickening and pleural calcifications. Upper abdominal ascites with probable hepatic cirrhosis. These results were called by telephone at the time of interpretation on 02/24/2016 at 3:14 am to Dr. Ross Marcus , who verbally acknowledged these results. Electronically Signed   By: Burman Nieves M.D.   On: 02/24/2016 03:17   Dg Chest Portable 1 View  Result Date: 02/24/2016 CLINICAL DATA:  Chest pain and shortness of breath.  Home oxygen. EXAM: PORTABLE CHEST 1 VIEW COMPARISON:  01/15/2016 FINDINGS: Cardiac pacemaker. Cardiac enlargement without significant pulmonary vascular congestion. Perihilar interstitial changes suggesting edema. Bilateral pleural effusions with basilar atelectasis, greater on the right. Appearances are similar to previous study. No pneumothorax. Mediastinal contours appear intact. IMPRESSION: Cardiac enlargement with interstitial  edema, bilateral pleural effusions, and basilar atelectasis, similar to the prior study. Electronically Signed   By: Burman Nieves M.D.   On: 02/24/2016 01:47      SIGNIFICANT EVENTS  2/12  Admit with SOB, hypotension/elevated D-dimer > CTA chest with concern for mass, R main pulm art thrombus  STUDIES:  2/12  CTA of the chest >> thrombosis in the distal right main pulmonary artery extending into lower lobe branches, no right heart strain, concern for possible mass in the right infrahilar region causing narrowing of the right lower lobe bronchus with post-obstructive changes, right sided pleural thickening, upper abdominal ascites with probable hepatic cirrhosis.   Attending Note:  ASSESSMENT / PLAN:  66 year old male with PMH of COPD who quit smoking 8 years prior to presentation who comes to the ED with the chief complaint of SOB.  CTA was performed and thrombosis of the distal right pulmonary artery and a lung mass.  The patient was started on heparin and PCCM was called to evaluate for thrombus and mass.  On exam, distant BS audible bilaterally and patient is no acute distress.  I reviewed CT of the chest myself, no flow in the right pulmonary artery, pleural effusion and mass noted.  Discussed with PCCM-NP.  Lung mass:             -  Will need a bronchoscopy but would like to see CVTS opinion first.  Right pulmonary artery thrombus:             - Heparin             - Will need long term anti-coagulation  Hypoxemia:             - Titrate O2 for sat of 88-92%.             - Will need ambulatory desat study prior to discharge  Pleural effusion and pleural calcification             - No thora at this time, loculated.             - F/U with imaging.             - Recommend thoracic surgery consult for pleural biopsy as I am concerned for mesothelioma, will discuss Dr. Alla German.  PCCM will follow  Patient seen and examined, agree with above note.  I dictated the care and  orders written for this patient under my direction.  Alyson Reedy, MD [phone]

## 2016-02-24 NOTE — Progress Notes (Signed)
ANTICOAGULATION CONSULT NOTE  Pharmacy Consult for Heparin Indication: pulmonary embolus  Allergies  Allergen Reactions  . Crestor [Rosuvastatin Calcium] Other (See Comments)    Back spasms    Patient Measurements: Height: 5\' 11"  (180.3 cm) Weight: (!) 587 lb 4.9 oz (266.4 kg) IBW/kg (Calculated) : 75.3  Ht: 71 in Wt: 109 kg  IBW: 75 kg Heparin Dosing Weight: 99 klg  Vital Signs: Temp: 97.8 F (36.6 C) (02/12 1659) Temp Source: Oral (02/12 1659) BP: 117/75 (02/12 1659) Pulse Rate: 70 (02/12 1659)  Labs:  Recent Labs  02/24/16 0108 02/24/16 0621 02/24/16 1217 02/24/16 1742  HGB 16.1 15.9  --   --   HCT 48.2 48.0  --   --   PLT 175 170  --   --   HEPARINUNFRC  --   --  0.39 0.46  CREATININE 1.37* 1.47*  --   --   TROPONINI  --  <0.03 <0.03  --     Estimated Creatinine Clearance: 110.4 mL/min (by C-G formula based on SCr of 1.47 mg/dL (H)).   Assessment: 66 y.o. M presents with CP. CT shows thrombosis in the distal right main pulmonary artery and extending into lower lobe branches. No right heart strain. Pt notes he has been prescribed Xarelto for atrial fibrillation but cannot take it until he finishes an education class at the [place].   PM heparin level therapeutic  Goal of Therapy:  Heparin level 0.3-0.7 units/ml Monitor platelets by anticoagulation protocol: Yes   Plan:  Continue Heparin infusion at 1600 units/hr Daily heparin level and CBC  Thank you Okey Regal, PharmD [phone]  02/24/2016 6:48 PM

## 2016-02-24 NOTE — Progress Notes (Signed)
ANTICOAGULATION CONSULT NOTE  Pharmacy Consult for Heparin Indication: pulmonary embolus  Allergies  Allergen Reactions  . Crestor [Rosuvastatin Calcium] Other (See Comments)    Back spasms    Patient Measurements:    Ht: 71 in Wt: 109 kg  IBW: 75 kg Heparin Dosing Weight: 99 klg  Vital Signs: BP: 99/80 (02/12 1316) Pulse Rate: 79 (02/12 1316)  Labs:  Recent Labs  02/24/16 0108 02/24/16 0621 02/24/16 1217  HGB 16.1 15.9  --   HCT 48.2 48.0  --   PLT 175 170  --   HEPARINUNFRC  --   --  0.39  CREATININE 1.37* 1.47*  --   TROPONINI  --  <0.03 <0.03    CrCl cannot be calculated (Unknown ideal weight.).   Assessment: 66 y.o. M presents with CP. CT shows thrombosis in the distal right main pulmonary artery and extending into lower lobe branches. No right heart strain. Pt notes he has been prescribed Xarelto for atrial fibrillation but cannot take it until he finishes an education class at the [place].   Started on heparin infusion at 1600 units/hr- first level in therapeutic range at 0.39 units/mL. Hgb and platelets within normal limits and  stable, no bleeding noted.  Goal of Therapy:  Heparin level 0.3-0.7 units/ml Monitor platelets by anticoagulation protocol: Yes   Plan:  Continue Heparin infusion at 1600 units/hr Repeat heparin level in 6 hours to confirm Daily heparin level and CBC  Lawarence Meek D. Alleta Avery, PharmD, BCPS Clinical Pharmacist Pager: [phone] 02/24/2016 1:44 PM

## 2016-02-24 NOTE — Progress Notes (Signed)
  Echocardiogram 2D Echocardiogram with definity has been performed.  Janalyn Harder 02/24/2016, 10:54 AM

## 2016-02-24 NOTE — ED Notes (Signed)
Echo at bedside

## 2016-02-24 NOTE — Care Management Note (Signed)
Case Management Note  Patient Details  Name: Max Rivas MRN: [id number] Date of Birth: 22-Mar-1950  Subjective/Objective:                  From home alone.  /65 y.o. male with history of CAD status post stenting, recently diagnosed atrial fibrillation admitted last month for decompensated CHF presents to the ER because of worsening shortness of breath increasing peripheral edema.   Action/Plan: Admit status INPATIENT (Acute respiratory failure with hypoxia secondary to acute on chronic systolic heart failure); anticipate discharge HOME WITH HOME HEALTH.   Expected Discharge Date:   (unsure)               Expected Discharge Plan:  Home w Home Health Services  In-House Referral:  NA  Discharge planning Services  CM Consult  Post Acute Care Choice:    Choice offered to:     DME Arranged:    DME Agency:     HH Arranged:    HH Agency:     Status of Service:  In process, will continue to follow  If discussed at Long Length of Stay Meetings, dates discussed:    Additional Comments:  Oletta Cohn, RN 02/24/2016, 11:15 AM

## 2016-02-25 ENCOUNTER — Inpatient Hospital Stay (HOSPITAL_COMMUNITY): Payer: Non-veteran care

## 2016-02-25 DIAGNOSIS — R590 Localized enlarged lymph nodes: Secondary | ICD-10-CM

## 2016-02-25 DIAGNOSIS — I2699 Other pulmonary embolism without acute cor pulmonale: Secondary | ICD-10-CM

## 2016-02-25 DIAGNOSIS — I4891 Unspecified atrial fibrillation: Secondary | ICD-10-CM

## 2016-02-25 DIAGNOSIS — J9621 Acute and chronic respiratory failure with hypoxia: Secondary | ICD-10-CM

## 2016-02-25 DIAGNOSIS — J9601 Acute respiratory failure with hypoxia: Secondary | ICD-10-CM

## 2016-02-25 DIAGNOSIS — I5021 Acute systolic (congestive) heart failure: Secondary | ICD-10-CM

## 2016-02-25 LAB — COMPREHENSIVE METABOLIC PANEL
ALBUMIN: 3.1 g/dL — AB (ref 3.5–5.0)
ALT: 35 U/L (ref 17–63)
ANION GAP: 11 (ref 5–15)
AST: 31 U/L (ref 15–41)
Alkaline Phosphatase: 96 U/L (ref 38–126)
BILIRUBIN TOTAL: 1.6 mg/dL — AB (ref 0.3–1.2)
BUN: 35 mg/dL — ABNORMAL HIGH (ref 6–20)
CO2: 22 mmol/L (ref 22–32)
Calcium: 8.8 mg/dL — ABNORMAL LOW (ref 8.9–10.3)
Chloride: 94 mmol/L — ABNORMAL LOW (ref 101–111)
Creatinine, Ser: 1.71 mg/dL — ABNORMAL HIGH (ref 0.61–1.24)
GFR calc Af Amer: 47 mL/min — ABNORMAL LOW (ref >60)
GFR, EST NON AFRICAN AMERICAN: 41 mL/min — AB (ref >60)
Glucose, Bld: 136 mg/dL — ABNORMAL HIGH (ref 65–99)
POTASSIUM: 4.9 mmol/L (ref 3.5–5.1)
Sodium: 127 mmol/L — ABNORMAL LOW (ref 135–145)
TOTAL PROTEIN: 6.6 g/dL (ref 6.5–8.1)

## 2016-02-25 LAB — CBC
HEMATOCRIT: 49.7 % (ref 39.0–52.0)
Hemoglobin: 16.3 g/dL (ref 13.0–17.0)
MCH: 31.3 pg (ref 26.0–34.0)
MCHC: 32.8 g/dL (ref 30.0–36.0)
MCV: 95.4 fL (ref 78.0–100.0)
PLATELETS: 160 10*3/uL (ref 150–400)
RBC: 5.21 MIL/uL (ref 4.22–5.81)
RDW: 16 % — AB (ref 11.5–15.5)
WBC: 8.8 10*3/uL (ref 4.0–10.5)

## 2016-02-25 LAB — COOXEMETRY PANEL
CARBOXYHEMOGLOBIN: 1.3 % (ref 0.5–1.5)
CARBOXYHEMOGLOBIN: 1.6 % — AB (ref 0.5–1.5)
Methemoglobin: 1.1 % (ref 0.0–1.5)
Methemoglobin: 0.9 % (ref 0.0–1.5)
O2 Saturation: 38.6 %
O2 Saturation: 65.7 %
Total hemoglobin: 12.9 g/dL (ref 12.0–16.0)
Total hemoglobin: 14.4 g/dL (ref 12.0–16.0)

## 2016-02-25 LAB — OSMOLALITY, URINE: OSMOLALITY UR: 303 mosm/kg (ref 300–900)

## 2016-02-25 LAB — MRSA PCR SCREENING: MRSA by PCR: NEGATIVE

## 2016-02-25 LAB — HEPARIN LEVEL (UNFRACTIONATED): HEPARIN UNFRACTIONATED: 0.68 [IU]/mL (ref 0.30–0.70)

## 2016-02-25 MED ORDER — ARFORMOTEROL TARTRATE 15 MCG/2ML IN NEBU
15.0000 ug | INHALATION_SOLUTION | Freq: Two times a day (BID) | RESPIRATORY_TRACT | Status: DC
  Administered 2016-02-25 – 2016-03-05 (×18): 15 ug via RESPIRATORY_TRACT
  Filled 2016-02-25 (×18): qty 2

## 2016-02-25 MED ORDER — AMIODARONE HCL IN DEXTROSE 360-4.14 MG/200ML-% IV SOLN
30.0000 mg/h | INTRAVENOUS | Status: DC
  Administered 2016-02-25 – 2016-03-03 (×15): 30 mg/h via INTRAVENOUS
  Filled 2016-02-25 (×16): qty 200

## 2016-02-25 MED ORDER — SODIUM CHLORIDE 0.9% FLUSH
10.0000 mL | Freq: Two times a day (BID) | INTRAVENOUS | Status: DC
  Administered 2016-02-25: 30 mL
  Administered 2016-02-26 – 2016-02-29 (×7): 10 mL
  Administered 2016-02-29: 20 mL
  Administered 2016-03-01 – 2016-03-02 (×3): 10 mL
  Administered 2016-03-02: 20 mL
  Administered 2016-03-03 – 2016-03-04 (×4): 10 mL

## 2016-02-25 MED ORDER — SODIUM CHLORIDE 0.9% FLUSH: 10.0000 mL | INTRAVENOUS | Status: DC | PRN

## 2016-02-25 MED ORDER — BUDESONIDE 0.25 MG/2ML IN SUSP
0.5000 mg | Freq: Two times a day (BID) | RESPIRATORY_TRACT | Status: DC
  Administered 2016-02-25 – 2016-02-26 (×2): 0.25 mg via RESPIRATORY_TRACT
  Administered 2016-02-26 – 2016-03-02 (×11): 0.5 mg via RESPIRATORY_TRACT
  Administered 2016-03-03: 0.25 mg via RESPIRATORY_TRACT
  Administered 2016-03-03 – 2016-03-05 (×4): 0.5 mg via RESPIRATORY_TRACT
  Filled 2016-02-25 (×18): qty 4

## 2016-02-25 MED ORDER — HEPARIN (PORCINE) IN NACL 100-0.45 UNIT/ML-% IJ SOLN
1550.0000 [IU]/h | INTRAMUSCULAR | Status: AC
  Administered 2016-02-25 – 2016-03-03 (×9): 1550 [IU]/h via INTRAVENOUS
  Filled 2016-02-25 (×10): qty 250

## 2016-02-25 MED ORDER — METOLAZONE 2.5 MG PO TABS
2.5000 mg | ORAL_TABLET | Freq: Once | ORAL | Status: AC
Start: 2016-02-25 — End: 2016-02-25
  Administered 2016-02-25: 2.5 mg via ORAL
  Filled 2016-02-25: qty 1

## 2016-02-25 MED ORDER — IOPAMIDOL (ISOVUE-300) INJECTION 61%
INTRAVENOUS | Status: AC
  Administered 2016-02-25: 75 mL
  Filled 2016-02-25: qty 75

## 2016-02-25 MED ORDER — ARFORMOTEROL TARTRATE 15 MCG/2ML IN NEBU: 15.0000 ug | INHALATION_SOLUTION | Freq: Two times a day (BID) | RESPIRATORY_TRACT | Status: DC

## 2016-02-25 MED ORDER — METHYLPREDNISOLONE SODIUM SUCC 40 MG IJ SOLR
40.0000 mg | Freq: Two times a day (BID) | INTRAMUSCULAR | Status: DC
  Administered 2016-02-25 – 2016-02-26 (×2): 40 mg via INTRAVENOUS
  Filled 2016-02-25 (×2): qty 1

## 2016-02-25 MED ORDER — METOPROLOL SUCCINATE ER 25 MG PO TB24: 25.0000 mg | ORAL_TABLET | Freq: Every day | ORAL | Status: DC

## 2016-02-25 MED ORDER — MILRINONE LACTATE IN DEXTROSE 20-5 MG/100ML-% IV SOLN
0.1250 ug/kg/min | INTRAVENOUS | Status: DC
  Administered 2016-02-25 – 2016-03-03 (×14): 0.25 ug/kg/min via INTRAVENOUS
  Administered 2016-03-03 (×2): 0.125 ug/kg/min via INTRAVENOUS
  Filled 2016-02-25 (×17): qty 100

## 2016-02-25 NOTE — Progress Notes (Addendum)
Patient ID: Max Rivas, male   DOB: 03/25/1950, 66 y.o.   MRN: [id number]    SUBJECTIVE: Says he went to the bathroom a lot yesterday but recorded UOP not that much, not sure if it's all being recorded.  Weight not accurate.  Still short of breath with minimal activity.   Scheduled Meds: . aspirin EC  81 mg Oral Daily  . atorvastatin  20 mg Oral q1800  . budesonide  0.25 mg Inhalation BID  . digoxin  0.25 mg Oral Daily  . furosemide  80 mg Intravenous TID  . metolazone  2.5 mg Oral Once  . metoprolol succinate  75 mg Oral BID  . multivitamin with minerals  1 tablet Oral Daily  . omega-3 acid ethyl esters  1 g Oral BID  . pantoprazole  40 mg Oral Daily  . spironolactone  12.5 mg Oral Daily   Continuous Infusions: . heparin 1,600 Units/hr (02/24/16 2147)   PRN Meds:.acetaminophen **OR** acetaminophen, levalbuterol, ondansetron **OR** ondansetron (ZOFRAN) IV    Vitals:   02/24/16 2054 02/24/16 2106 02/25/16 0305 02/25/16 0346  BP: 91/72  121/82 111/81  Pulse: 92  (!) 106 63  Resp: 18   18  Temp: 97.4 F (36.3 C)   97.5 F (36.4 C)  TempSrc: Oral   Oral  SpO2: 98% 98% 97% 99%  Weight:    254 lb 1.6 oz (115.3 kg)  Height:        Intake/Output Summary (Last 24 hours) at 02/25/16 0754 Last data filed at 02/24/16 2053  Gross per 24 hour  Intake              480 ml  Output             1100 ml  Net             -620 ml    LABS: Basic Metabolic Panel:  Recent Labs  40/98/11 0621 02/25/16 0254  NA 128* 127*  K 4.2 4.9  CL 92* 94*  CO2 23 22  GLUCOSE 101* 136*  BUN 32* 35*  CREATININE 1.47* 1.71*  CALCIUM 8.8* 8.8*  MG 2.1  --    Liver Function Tests:  Recent Labs  02/24/16 0621 02/25/16 0254  AST 30 31  ALT 35 35  ALKPHOS 92 96  BILITOT 2.3* 1.6*  PROT 6.1* 6.6  ALBUMIN 3.1* 3.1*   No results for input(s): LIPASE, AMYLASE in the last 72 hours. CBC:  Recent Labs  02/24/16 0108 02/24/16 0621 02/25/16 0254  WBC 9.9 8.8 8.8  NEUTROABS 6.4 5.4   --   HGB 16.1 15.9 16.3  HCT 48.2 48.0 49.7  MCV 94.1 94.9 95.4  PLT 175 170 160   Cardiac Enzymes:  Recent Labs  02/24/16 0621 02/24/16 1217 02/24/16 1742  TROPONINI <0.03 <0.03 <0.03   BNP: Invalid input(s): POCBNP D-Dimer:  Recent Labs  02/24/16 0108  DDIMER 3.99*   Hemoglobin A1C: No results for input(s): HGBA1C in the last 72 hours. Fasting Lipid Panel: No results for input(s): CHOL, HDL, LDLCALC, TRIG, CHOLHDL, LDLDIRECT in the last 72 hours. Thyroid Function Tests:  Recent Labs  02/24/16 0621  TSH 8.811*   Anemia Panel: No results for input(s): VITAMINB12, FOLATE, FERRITIN, TIBC, IRON, RETICCTPCT in the last 72 hours.  RADIOLOGY: Ct Angio Chest Pe W And/or Wo Contrast  Result Date: 02/24/2016 CLINICAL DATA:  Hypotension with positive D-dimer. EXAM: CT ANGIOGRAPHY CHEST WITH CONTRAST TECHNIQUE: Multidetector CT imaging of the chest was performed  using the standard protocol during bolus administration of intravenous contrast. Multiplanar CT image reconstructions and MIPs were obtained to evaluate the vascular anatomy. CONTRAST:  100 mL Isovue 370 COMPARISON:  None. FINDINGS: Cardiovascular: Large filling defect beginning in the distal right main pulmonary artery and extending into the lower lobe branches. No flow is demonstrated in right lower lobe branches. Appearance is consistent with pulmonary arterial thrombus or embolus. Cardiac enlargement. RV to LV ratio is less than 1, suggesting no evidence of right heart strain. Reflux of contrast material into the hepatic veins is consistent with right heart failure. No pericardial effusion. Calcification of the thoracic aorta and coronary arteries. Mediastinum/Nodes: Scattered lymph nodes are present throughout the mediastinum and in both hilar regions. Right paratracheal lymph nodes measure about 1.8 cm short axis dimension. Appearance is nonspecific. These may be reactive nodes although pathologic lymphadenopathy is not  excluded. Lungs/Pleura: Diffuse right-sided pleural thickening with pleural calcifications. Bilateral pleural effusions. Basilar atelectasis bilaterally. Emphysematous changes in the lungs. Right upper lung nodule with mild spiculation measures 7 mm diameter. There is effacement and narrowing of the right lower lobe bronchus with postobstructive consolidation. Suspicion of a a right infrahilar mass measuring 3.4 cm diameter. This could also be causing narrowing of the right lower lobe pulmonary artery, resulting in the thrombosis. Upper Abdomen: Upper abdominal ascites is present. Enlarged lateral segment of the left lobe of the liver suggests cirrhosis. Diffuse soft tissue edema. Musculoskeletal: No chest wall abnormality. No acute or significant osseous findings. Review of the MIP images confirms the above findings. IMPRESSION: Thrombosis in the distal right main pulmonary artery and extending into lower lobe branches. No right heart strain. I am suspicious that there is a mass in the right infrahilar region causing narrowing of the right lower lobe bronchus and associated postobstructive change. This mass may also be compressing the right pulmonary artery. Indeterminate 7 mm nodule in the right upper lobe. Indeterminate borderline enlarged hilar and mediastinal lymph nodes. Bilateral pleural effusions with basilar atelectasis. Right-sided pleural thickening and pleural calcifications. Upper abdominal ascites with probable hepatic cirrhosis. These results were called by telephone at the time of interpretation on 02/24/2016 at 3:14 am to Dr. Ross Marcus , who verbally acknowledged these results. Electronically Signed   By: Burman Nieves M.D.   On: 02/24/2016 03:17   Dg Chest Portable 1 View  Result Date: 02/24/2016 CLINICAL DATA:  Chest pain and shortness of breath.  Home oxygen. EXAM: PORTABLE CHEST 1 VIEW COMPARISON:  01/15/2016 FINDINGS: Cardiac pacemaker. Cardiac enlargement without significant  pulmonary vascular congestion. Perihilar interstitial changes suggesting edema. Bilateral pleural effusions with basilar atelectasis, greater on the right. Appearances are similar to previous study. No pneumothorax. Mediastinal contours appear intact. IMPRESSION: Cardiac enlargement with interstitial edema, bilateral pleural effusions, and basilar atelectasis, similar to the prior study. Electronically Signed   By: Burman Nieves M.D.   On: 02/24/2016 01:47    PHYSICAL EXAM General: NAD Neck: JVP 16 cm, no thyromegaly or thyroid nodule.  Lungs: Bilateral rhonchi.  CV: Nondisplaced PMI.  Heart mildly tachy, irregular S1/S2, no S3/S4, no murmur.  2+ edema into thighs.  No carotid bruit.  Normal pedal pulses.  Abdomen: Soft, nontender, no hepatosplenomegaly, no distention.  Neurologic: Alert and oriented x 3.  Psych: Normal affect. Extremities: No clubbing or cyanosis.   TELEMETRY: Reviewed telemetry pt in atrial fibrillation 90s-100s  ASSESSMENT AND PLAN: Patient has long history of ischemic cardiomyopathy, EF 25-30% by echo 2/12 (consistent with past).  He remains  in atrial fibrillation with controlled rate.  He has a substantial PE.  He also has a lung mass concerning for cancer and started back smoking a few days ago.  He came to the hospital because of dyspnea with minimal exertion and swelling.  1. Acute on chronic systolic CHF: Echo this admission with EF 25-30%, mild LV dilation, moderate MR, PASP 39 mmHg, RV moderately dilated/moderately dysfunction.  Ischemic cardiomyopathy, has MDT ICD.  NYHA class IIIb symptoms, marked volume overload on exam.  Exacerbation likely triggered by ongoing atrial fibrillation and RV failure made worse by PE.  Not sure I/Os accurately recorded yesterday.  Still very volume overloaded.  Creatinine up a bit to 1.7.  - Lasix 80 mg IV every 8 hrs, will give a dose of metolazone 2.5 x 1.   - Spironolactone 12.5 daily.  - Can continue current Toprol XL for now.    - Continue digoxin, level ok.  - Given volume overload, ?efficacy of diuretic regimen, and rising creatinine, will place PICC line and transfer to stepdown to monitor CVP and co-ox.  If he needs milrinone, may need amiodarone gtt for rate control.  2. PE: New diagnosis, noted in distal right main.  Trigger may be right infrahilar mass/?lung cancer.   - Heparin gtt for now, pulmonary pulmonary following.  Would favor DOAC eventually.  3. Atrial fibrillation: Persistent since 1/18 admission.  Rate is reasonable controlled on digoxin and Toprol XL (HR 90s-100s).  Was not able to get Xarelto from the [place], unfortunately it appears that there is an hour long class that has to be attended before Xarelto will be dispensed, and he had not had time to go to the class. In the interim, he had a PE.   - Continue Toprol XL and digoxin for now.  - Heparin gtt for now, would favor DOAC use eventually (hopefully can get this outside the [place] for him).  - Eventually would favor DCCV attempt. However, would not do DCCV at this time with fresh PE.  Would give some time for PE to stabilize/resolve (possible DCCV in a month).  4. Lung mass: Right infrahilar.  Possible lung cancer, pulmonary following, will need bronch/biopsy.  He has recently resumed smoking, needs to quit.  5. Cirrhosis: CT concerning for possible hepatic cirrhosis.  6. CKD: Creatinine higher today, baseline 1.3-1.4.  Still needs diuresis, as above will place PICC and assess co-ox for milrinone need.  7. Hyponatremia: If Na falls any farther, will give tolvaptan.  Fluid restrict.   Marca Ancona 02/25/2016 8:01 AM

## 2016-02-25 NOTE — Progress Notes (Addendum)
Pulmonary & Critical Care Attending Note  Presenting HPI:  66 y.o. male admitted in early January for congestive heart failure decompensation and do diagnosis of atrial fibrillation. Patient discharged on Xarelto but was unable to initiate the prescription due to the [place] bureaucracy. Patient presented with chest discomfort and dyspnea despite use of oxygen at home. Patient also endorsed progressively increasing generalized anasarca over approximately 2 weeks without relief from oral diuretic therapy. Patient was noted to have hypotension with an elevated d-dimer prompting CT angiogram of the chest. This demonstrated right sided pulmonary thrombus without evidence of strain. Patient also was found to have narrowing of the right lower lobe bronchus with postobstructive changes.  Subjective:  Patient reports overall dyspnea is largely unchanged. Reports intermittent cough productive of a "cream" phlegm. Denies any hemoptysis. Has noted a palpable lymph node within his right anterior cervical chain. Denies any other noticeable lymphadenopathy. Denies any dysphagia or odynophagia. Reports chronic arthralgia was in his knees, elbows, and various other joints. Denies any long bone pain.  Review of Systems:  Denies any recent chest pain, pressure, or tightness. He denies any subjective fever, chills, or sweats. He denies any persistent headache or acute vision changes. He denies any focal weakness, numbness, or tingling.  Temp:  [97.4 F (36.3 C)-97.5 F (36.4 C)] 97.5 F (36.4 C) (02/13 0346) Pulse Rate:  [28-106] 28 (02/13 1820) Resp:  [18] 18 (02/13 1820) BP: (91-121)/(72-82) 110/78 (02/13 1820) SpO2:  [96 %-99 %] 96 % (02/13 1820) Weight:  [254 lb 1.6 oz (115.3 kg)] 254 lb 1.6 oz (115.3 kg) (02/13 0346)  General:  Morbidly obese. Sitting up on the side of the bed. No acute distress. No family present. Integument:  Warm & dry. No rash on exposed skin.  Lymphatics: No appreciated supraclavicular  lymph nodes. Patient does have a palpable lymph node in his right anterior cervical chain. Lymph node is not painful to palpation.    HEENT:  No scleral icterus or injection. Pupils symmetric.  Pulmonary:  Slightly diminished breath sounds in the bases bilaterally. No accessory muscle use on supplemental oxygen. Very mild anterior apical end expiratory wheeze.  Cardiovascular:  Regular rate. Anasarca noted. Currently on Primacor and amiodarone infusions. Abdomen:  Soft. Nontender. Protuberant. Neurological:  Cranial nerves grossly in tact. No meningismus. Oriented x4.   CBC Latest Ref Rng & Units 02/25/2016 02/24/2016 02/24/2016  WBC 4.0 - 10.5 K/uL 8.8 8.8 9.9  Hemoglobin 13.0 - 17.0 g/dL 16.1 09.6 04.5  Hematocrit 39.0 - 52.0 % 49.7 48.0 48.2  Platelets 150 - 400 K/uL 160 170 175   BMP Latest Ref Rng & Units 02/25/2016 02/24/2016 02/24/2016  Glucose 65 - 99 mg/dL 409(W) 119(J) 478(G)  BUN 6 - 20 mg/dL 95(A) 21(H) 08(M)  Creatinine 0.61 - 1.24 mg/dL 5.78(I) 6.96(E) 9.52(W)  Sodium 135 - 145 mmol/L 127(L) 128(L) 127(L)  Potassium 3.5 - 5.1 mmol/L 4.9 4.2 4.6  Chloride 101 - 111 mmol/L 94(L) 92(L) 94(L)  CO2 22 - 32 mmol/L 22 23 22   Calcium 8.9 - 10.3 mg/dL 4.1(L) 2.4(M) 8.9   IMAGING/STUDIES: CTA CHEST 02/24/16:  Personally reviewed by me. Thrombus within the distal right main pulmonary artery extending into the branches of the right lower lobe. No evidence of right heart strain based on RV/LV ratio. Spiculated 7 mm right upper lobe nodule within the posterior segment subpleural. Pleural thickening with calcification on the right. Small left pleural effusion. Apical predominant emphysematous changes. Opacification within posterior segment of right lower lobe  corresponding to right infrahilar mass measuring over 3 cm in maximal dimension with evidence of narrowing of bronchitis. Multiple mediastinal lymph nodes noted both precarinal and subcarinal. Largest lymph node measures up to 1.8 cm but could  also represent a conglomerate of lymph nodes. Largest obviously evident single lymph node was 1.1 cm in short axis by my measurement and at Level 4R.  TTE (02/24/16):  LV mildly dilated with moderate focal basal septal hypertrophy. EF 25-30% with diffuse hypokinesis. There is akinesis of the inferolateral myocardium as well as the basal inferior myocardium. Unable to assess diastolic dysfunction due to atrial fibrillation. LA & RA mildly dilated. RV is moderately dilated with pacer wire or catheter noted within right ventricle. Systolic function of the RV was moderately reduced. No aortic stenosis or regurgitation. Aortic root normal in size. Mild to moderate mitral regurgitation without stenosis. No pulmonic stenosis but poorly visualized valve. Trivial tricuspid regurgitation. Trivial free-flowing pericardial effusion identified at the apex.  MICROBIOLOGY: MRSA PCR 01/15/16:  Negative Influenza A/B PCR 01/15/16:  Negative   ANTIBIOTICS: None.  ASSESSMENT/PLAN:  66 y.o. male found to have a right-sided pulmonary embolus with decompensated congestive heart failure. Patient was incidentally found to have right lower lobe opacification and evidence of bronchial narrowing as well as mediastinal lymphadenopathy. Given this I was asked to assess the patient for possible bronchoscopy with endobronchial ultrasound-guided fine needle aspiration by the consulting and primary services. The palpable lymph node in his right anterior cervical chain on physical exam could represent further metastatic spread of his possible underlying malignancy. I discussed his CT findings at length as well as the procedure associated with bronchoscopy and endobronchial ultrasound-guided fine-needle aspiration. With his decompensated congestive heart failure, volume overload, and the initiation of a Primacor infusion I believe the risks of sedation with mechanical ventilation and mediastinal lymph node sampling with bronchoscopy would be  extremely high for this patient. Given the fact that he seems to have a palpable lymph node in his anterior cervical chain I would favor imaging of his neck to determine if this could be more easily be accessed with simple local anesthesia and a fine needle aspiration under ultrasound guidance. If this is impossible then I would favor postponing his bronchoscopy until his clinical status has improved to allow the procedure to be performed.  1. Cervical lymphadenopathy: Checking CT neck with contrast. Plan for biopsy depending upon this result. 2. Lung mass with mediastinal adenopathy: I have canceled the patient's bronchoscopy scheduled for Friday. Await results of CT scan and clinical improvement before rescheduling. 3. Acute on chronic hypoxic respiratory failure: Multifactorial. Agree with continuing diuresis as renal function allows. Cardiology guiding Primacor and amiodarone infusions. 4. Acute pulmonary embolism without acute cor pulmonale: Agree with systemic and coagulation with heparin infusion for now. Recommend holding off on transitioning to oral anticoagulant given the potential need for upcoming procedures. 5. COPD with mild exacerbation: No pulmonary function testing available to confirm this diagnosis. However, the patient does have emphysema on imaging. Patient will need to undergo screening for alpha-1 antitrypsin deficiency as an outpatient. Agree with continuing steroid taper along with Pulmicort and Brovana nebulized twice daily. 6. Pleural calcification with effusion : Recommend deferring VATS and pleural biopsy at this time. If significant fluid accumulates could consider thoracentesis with routine pleural fluid analysis and cytology. Recommend continuing to monitor with chest x-ray daily.  Remainder of care as per primary service.  I have spent a total of 42 minutes of time today caring for  the patient, reviewing the patient's electronic medical record, and with more than 50% of  that time spent coordinating care with the patient as well as reviewing the continuing plan of care with the patient at bedside.  Donna Christen Jamison Neighbor, M.D. [facility] Pulmonary & Critical Care Pager:  [phone] After 3pm or if no response, call [phone] 7:23 PM 02/25/16

## 2016-02-25 NOTE — Progress Notes (Signed)
Pt transferred from [address] [place] to [place]. Pt oriented to room. Will cont to monitor pt.

## 2016-02-25 NOTE — Progress Notes (Signed)
ANTICOAGULATION CONSULT NOTE  Pharmacy Consult for Heparin Indication: pulmonary embolus  Allergies  Allergen Reactions  . Crestor [Rosuvastatin Calcium] Other (See Comments)    Back spasms    Patient Measurements: Height: 5\' 11"  (180.3 cm) Weight: 254 lb 1.6 oz (115.3 kg) IBW/kg (Calculated) : 75.3  Ht: 71 in Wt: 109 kg  IBW: 75 kg Heparin Dosing Weight: 99 klg  Vital Signs: Temp: 97.5 F (36.4 C) (02/13 0346) Temp Source: Oral (02/13 0346) BP: 111/81 (02/13 0346) Pulse Rate: 63 (02/13 0346)  Labs:  Recent Labs  02/24/16 0108 02/24/16 0621 02/24/16 1217 02/24/16 1742 02/25/16 0252 02/25/16 0254  HGB 16.1 15.9  --   --   --  16.3  HCT 48.2 48.0  --   --   --  49.7  PLT 175 170  --   --   --  160  HEPARINUNFRC  --   --  0.39 0.46 0.68  --   CREATININE 1.37* 1.47*  --   --   --  1.71*  TROPONINI  --  <0.03 <0.03 <0.03  --   --     Estimated Creatinine Clearance: 57.1 mL/min (by C-G formula based on SCr of 1.71 mg/dL (H)).   Assessment: 45 YOM with history of Afib prescribed Xarelto PTA but he couldn't start until he finishes an education class at the [facility]  Patient presented with a PE and started in IV heparin.  Heparin level is therapeutic and toward the high end of normal.  No bleeding reported.   Goal of Therapy:  Heparin level 0.3-0.7 units/ml Monitor platelets by anticoagulation protocol: Yes    Plan:  - Reduce heparin gtt slightly to 1550 units/hr  - Daily heparin level and CBC - Consider checking free T3/T4 to assess thyroid function - F/U with transitioning patient to PO anticoagulation   Angelise Petrich D. Laney Potash, PharmD, BCPS Pager:  [phone] 02/25/2016, 8:02 AM

## 2016-02-25 NOTE — Progress Notes (Signed)
Clarified with IV team ok to use PICC. CVP monitoring set up. Pt stated that there was no family to call and make aware of transfer. Will cont to monitor pt.

## 2016-02-25 NOTE — Progress Notes (Addendum)
Patient ID: Max Rivas, male   DOB: [birth year], 66 y.o.   MRN: [id number]  PROGRESS NOTE    Max Rivas  WUJ:[id number] DOB: April 15, 1950 DOA: 02/24/2016  PCP: Jyl Heinz, MD   Brief Narrative:  66 year old male with past medical history of ischemic cardiomyopathy (EF 25-30% by ECHO in 2012), recent hospitalization for acute decompensated CHF and atrial fibrillation on xarelto, PE and lung mass concerning for cancer. He presented to ED with shortness of breath at rest with exertion as well as leg swelling for 48 hours prior to this admission. Pt also had chest pain and associated productive cough, fever and chills.  D dimer was positive and CT angio chest was subsequently done and showed right pulmonary artery thrombus and possible lung mass and bilateral pleural effusion. Xarelto on discharge in 01/2016 was prescribed but pt never took it as he had to attend class in [place] before obtaining the medication.   Assessment & Plan:  Acute respiratory failure with hypoxia secondary to acute on chronic systolic heart failure / Acute pulmonary embolism / Acute COPD exacerbation  - 2 D ECHO on this admission showed EF 25%, diffuse hypokinesis - Continue Lasix 80 mg IV Q 8 hours  - Continue solumedrol 40 mg Q 12 hours IV - Continue brovana and Pulmicort BID nebulizer  - Pulmonary following  Right-sided pulmonary thrombus with possible lung mass - Appreciate pulmonary following - Pt on heparin infusion - Plan for EBUS on Friday 2/16  Atrial fibrillation with RVR  - CHADS vasc score 2 - On anticoagulation with heparin drip - Continue amiodarone drip - Continue digoxin - Rate controlled with metoprolol  Essential hypertension - Continue metoprolol  Acute on chronic systolic CHF - Continue lasix 80 mg Q 8 hours - Continue spironolactone   Dyslipidemia - Continue Lipitor and omega 3 supplementation   Hyponatremia - Possibly from acute lung process, possible malignancy - Sodium  128 - Follow up BMP in am  Chronic kidney disease stage 3 - Baseline Cr 1.55 about 1 month ago - Cr within baseline range   Elevated TSH - TSH 8.8 on this admission - Needs follow up in outpt setting    DVT prophylaxis: Heparin drip Code Status: DNR/DNI Family Communication: no family at the bedside this am Disposition Plan: unclear at which point he will be stable for discahrge but once cleared by pulm and cardio   Consultants:   Cardio  PCCM  Procedures:   None  Antimicrobials:   None    Subjective: No overnight events.  Objective: Vitals:   02/25/16 0305 02/25/16 0346 02/25/16 0826 02/25/16 1004  BP: 121/82 111/81    Pulse: (!) 106 63  93  Resp:  18    Temp:  97.5 F (36.4 C)    TempSrc:  Oral    SpO2: 97% 99% 99%   Weight:  115.3 kg (254 lb 1.6 oz)    Height:        Intake/Output Summary (Last 24 hours) at 02/25/16 1537 Last data filed at 02/25/16 1230  Gross per 24 hour  Intake              480 ml  Output             1000 ml  Net             -520 ml   Filed Weights   02/24/16 1659 02/25/16 0346  Weight: (!) 266.4 kg (587 lb 4.9 oz) 115.3  kg (254 lb 1.6 oz)    Examination:  General exam: Appears calm and comfortable  Respiratory system: Bilateral wheezing, no rhonchi  Cardiovascular system: S1 & S2 heard, Rate controlled  Gastrointestinal system: Abdomen is nondistended, soft and nontender. No organomegaly or masses felt. Normal bowel sounds heard. Central nervous system: Alert and oriented. No focal neurological deficits. Extremities: Symmetric 5 x 5 power. Skin: +2 LE pitting edema, palpable pulses  Psychiatry: Judgement and insight appear normal. Mood & affect appropriate.   Data Reviewed: I have personally reviewed following labs and imaging studies  CBC:  Recent Labs Lab 02/24/16 0108 02/24/16 0621 02/25/16 0254  WBC 9.9 8.8 8.8  NEUTROABS 6.4 5.4  --   HGB 16.1 15.9 16.3  HCT 48.2 48.0 49.7  MCV 94.1 94.9 95.4  PLT 175  170 160   Basic Metabolic Panel:  Recent Labs Lab 02/24/16 0108 02/24/16 0621 02/25/16 0254  NA 127* 128* 127*  K 4.6 4.2 4.9  CL 94* 92* 94*  CO2 22 23 22   GLUCOSE 120* 101* 136*  BUN 32* 32* 35*  CREATININE 1.37* 1.47* 1.71*  CALCIUM 8.9 8.8* 8.8*  MG  --  2.1  --    GFR: Estimated Creatinine Clearance: 57.1 mL/min (by C-G formula based on SCr of 1.71 mg/dL (H)). Liver Function Tests:  Recent Labs Lab 02/24/16 0621 02/25/16 0254  AST 30 31  ALT 35 35  ALKPHOS 92 96  BILITOT 2.3* 1.6*  PROT 6.1* 6.6  ALBUMIN 3.1* 3.1*   No results for input(s): LIPASE, AMYLASE in the last 168 hours. No results for input(s): AMMONIA in the last 168 hours. Coagulation Profile: No results for input(s): INR, PROTIME in the last 168 hours. Cardiac Enzymes:  Recent Labs Lab 02/24/16 0621 02/24/16 1217 02/24/16 1742  TROPONINI <0.03 <0.03 <0.03   BNP (last 3 results) No results for input(s): PROBNP in the last 8760 hours. HbA1C: No results for input(s): HGBA1C in the last 72 hours. CBG: No results for input(s): GLUCAP in the last 168 hours. Lipid Profile: No results for input(s): CHOL, HDL, LDLCALC, TRIG, CHOLHDL, LDLDIRECT in the last 72 hours. Thyroid Function Tests:  Recent Labs  02/24/16 0621  TSH 8.811*   Anemia Panel: No results for input(s): VITAMINB12, FOLATE, FERRITIN, TIBC, IRON, RETICCTPCT in the last 72 hours. Urine analysis: No results found for: COLORURINE, APPEARANCEUR, LABSPEC, PHURINE, GLUCOSEU, HGBUR, BILIRUBINUR, KETONESUR, PROTEINUR, UROBILINOGEN, NITRITE, LEUKOCYTESUR Sepsis Labs: @LABRCNTIP (procalcitonin:4,lacticidven:4)   )No results found for this or any previous visit (from the past 240 hour(s)).    Radiology Studies: Ct Angio Chest Pe W And/or Wo Contrast Result Date: 02/24/2016 Thrombosis in the distal right main pulmonary artery and extending into lower lobe branches. No right heart strain. I am suspicious that there is a mass in the  right infrahilar region causing narrowing of the right lower lobe bronchus and associated postobstructive change. This mass may also be compressing the right pulmonary artery. Indeterminate 7 mm nodule in the right upper lobe. Indeterminate borderline enlarged hilar and mediastinal lymph nodes. Bilateral pleural effusions with basilar atelectasis. Right-sided pleural thickening and pleural calcifications. Upper abdominal ascites with probable hepatic cirrhosis. These results were called by telephone at the time of interpretation on 02/24/2016 at 3:14 am to Dr. Ross Marcus , who verbally acknowledged these results. Electronically Signed   By: Burman Nieves M.D.   On: 02/24/2016 03:17   Dg Chest Port 1 View Result Date: 02/25/2016 The tip of the PICC line projects over the  junction of the middle and distal thirds of the SVC. Electronically Signed   By: David  [place]az[place] M.D.   On: 02/25/2016 10:26   Dg Chest Portable 1 View Cardiac enlargement with interstitial edema, bilateral pleural effusions, and basilar atelectasis, similar to the prior study. Electronically Signed   By: Burman Nieves M.D.   On: 02/24/2016 01:47      Scheduled Meds: . arformoterol  15 mcg Nebulization BID  . aspirin EC  81 mg Oral Daily  . atorvastatin  20 mg Oral q1800  . budesonide  0.5 mg Inhalation BID  . digoxin  0.25 mg Oral Daily  . furosemide  80 mg Intravenous TID  . methylPREDNISol  40 mg Intravenous Q12H  . metoprolol succi  75 mg Oral BID  . multivitamin with m  1 tablet Oral Daily  . omega-3 acid ethyl   1 g Oral BID  . pantoprazole  40 mg Oral Daily  . spironolactone  12.5 mg Oral Daily   Continuous Infusions: . heparin 1,550 Units/hr (02/25/16 1521)     LOS: 1 day    Time spent: 25 minutes  Greater than 50% of the time spent on counseling and coordinating the care.   Manson Passey, MD [facility] Pager [phone]  If 7PM-7AM, please contact night-coverage [email or web address] Password  [facility] 02/25/2016, 3:37 PM

## 2016-02-25 NOTE — Progress Notes (Signed)
Name: Max Rivas MRN: [id number] DOB: 1950-07-04    ADMISSION DATE:  02/24/2016 CONSULTATION DATE:  02/24/16  REFERRING MD :  Dr. Susie Cassette   CHIEF COMPLAINT:  Shortness of Breath    BRIEF SUMMARY:  66 y/o M, current smoker, who presented to [facility] on 2/12 with a 48 hour history of shortness of breath.    The patient was recently admitted 1/3-01/19/16 for a CHF decompensation and new dx of AF dischared on Xarelto (has not taken as he was to attend a class at the [place] before he could fill the Rx) and O2.  He reported on admit that he woke up with chest pain and shortness of breath.  He put his home O2 on at 2L without relief.  He initially reported his pain as 7/10.  He also noted he had been having abdominal pain and generalized swelling over the last two weeks without relief from lasix.  He endorses that he has been taking his blood pressure medication as prescribed.  The patient was admitted per [facility] for further assessment.  CXR demonstrated cardiomegaly with interstitial edema, bilateral pleural effusions and basilar atelectasis.  The patient had hypotension and an elevated d-Dimer at 3.99.  Follow up CTA of the chest was obtained which demonstrated thrombosis in the distal right main pulmonary artery extending into lower lobe branches, no right heart strain, concern for possible mass in the right infrahilar region causing narrowing of the right lower lobe bronchus with post-obstructive changes, right sided pleural thickening, upper abdominal ascites with probable hepatic cirrhosis.  Labs - Na 128, K 4.2, Cl 92, Sr Cr 1.47, AG 13, Albumin 3.1, troponin 0.03, BNP 1633, WBC 8.8, Hgb 15.9 and platelets 170.    He has a known medical hx of CHF, CAD s/p MIx5, Pacemaker, COPD and GERD.    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    SUBJECTIVE:  Pt remains on 3L (baseline 2L), denies SOB, chest pain,  fevers  VITAL SIGNS: Temp:  [97.4 F (36.3 C)-97.8 F (36.6 C)] 97.5 F (36.4 C) (02/13 0346) Pulse Rate:  [63-117] 63 (02/13 0346) Resp:  [9-26] 18 (02/13 0346) BP: (91-124)/(67-89) 111/81 (02/13 0346) SpO2:  [95 %-99 %] 99 % (02/13 0826) Weight:  [254 lb 1.6 oz (115.3 kg)-587 lb 4.9 oz (266.4 kg)] 254 lb 1.6 oz (115.3 kg) (02/13 0346)  PHYSICAL EXAMINATION: General: obese adult male in NAD HEENT: MM pink/moist PSY: normal mood / affect Neuro: AAOx4, speech clear, MAE  CV: s1s2 rrr, no m/r/g PULM: even/non-labored, lungs bilaterally with wheezing  OR:VIFB, non-tender, bsx4 active  Extremities: warm/dry, 2-3+ BLE pitting edema  Skin: no rashes or lesions   Recent Labs Lab 02/24/16 0108 02/24/16 0621 02/25/16 0254  NA 127* 128* 127*  K 4.6 4.2 4.9  CL 94* 92* 94*  CO2 22 23 22   BUN 32* 32* 35*  CREATININE 1.37* 1.47* 1.71*  GLUCOSE 120* 101* 136*     Recent Labs Lab 02/24/16 0108 02/24/16 0621 02/25/16 0254  HGB 16.1 15.9 16.3  HCT 48.2 48.0 49.7  WBC 9.9 8.8 8.8  PLT 175 170 160    Ct Angio Chest Pe W And/or Wo Contrast  Result Date: 02/24/2016 CLINICAL DATA:  Hypotension with positive D-dimer. EXAM: CT ANGIOGRAPHY CHEST WITH CONTRAST TECHNIQUE: Multidetector CT imaging of the chest was performed using the standard protocol during bolus administration of intravenous contrast. Multiplanar  CT image reconstructions and MIPs were obtained to evaluate the vascular anatomy. CONTRAST:  100 mL Isovue 370 COMPARISON:  None. FINDINGS: Cardiovascular: Large filling defect beginning in the distal right main pulmonary artery and extending into the lower lobe branches. No flow is demonstrated in right lower lobe branches. Appearance is consistent with pulmonary arterial thrombus or embolus. Cardiac enlargement. RV to LV ratio is less than 1, suggesting no evidence of right heart strain. Reflux of contrast material into the hepatic veins is consistent with right heart failure.  No pericardial effusion. Calcification of the thoracic aorta and coronary arteries. Mediastinum/Nodes: Scattered lymph nodes are present throughout the mediastinum and in both hilar regions. Right paratracheal lymph nodes measure about 1.8 cm short axis dimension. Appearance is nonspecific. These may be reactive nodes although pathologic lymphadenopathy is not excluded. Lungs/Pleura: Diffuse right-sided pleural thickening with pleural calcifications. Bilateral pleural effusions. Basilar atelectasis bilaterally. Emphysematous changes in the lungs. Right upper lung nodule with mild spiculation measures 7 mm diameter. There is effacement and narrowing of the right lower lobe bronchus with postobstructive consolidation. Suspicion of a a right infrahilar mass measuring 3.4 cm diameter. This could also be causing narrowing of the right lower lobe pulmonary artery, resulting in the thrombosis. Upper Abdomen: Upper abdominal ascites is present. Enlarged lateral segment of the left lobe of the liver suggests cirrhosis. Diffuse soft tissue edema. Musculoskeletal: No chest wall abnormality. No acute or significant osseous findings. Review of the MIP images confirms the above findings. IMPRESSION: Thrombosis in the distal right main pulmonary artery and extending into lower lobe branches. No right heart strain. I am suspicious that there is a mass in the right infrahilar region causing narrowing of the right lower lobe bronchus and associated postobstructive change. This mass may also be compressing the right pulmonary artery. Indeterminate 7 mm nodule in the right upper lobe. Indeterminate borderline enlarged hilar and mediastinal lymph nodes. Bilateral pleural effusions with basilar atelectasis. Right-sided pleural thickening and pleural calcifications. Upper abdominal ascites with probable hepatic cirrhosis. These results were called by telephone at the time of interpretation on 02/24/2016 at 3:14 am to Dr. Ross Marcus ,  who verbally acknowledged these results. Electronically Signed   By: Burman Nieves M.D.   On: 02/24/2016 03:17   Dg Chest Portable 1 View  Result Date: 02/24/2016 CLINICAL DATA:  Chest pain and shortness of breath.  Home oxygen. EXAM: PORTABLE CHEST 1 VIEW COMPARISON:  01/15/2016 FINDINGS: Cardiac pacemaker. Cardiac enlargement without significant pulmonary vascular congestion. Perihilar interstitial changes suggesting edema. Bilateral pleural effusions with basilar atelectasis, greater on the right. Appearances are similar to previous study. No pneumothorax. Mediastinal contours appear intact. IMPRESSION: Cardiac enlargement with interstitial edema, bilateral pleural effusions, and basilar atelectasis, similar to the prior study. Electronically Signed   By: Burman Nieves M.D.   On: 02/24/2016 01:47      SIGNIFICANT EVENTS  2/12  Admit with SOB, hypotension/elevated D-dimer > CTA chest with concern for mass, R main pulm art thrombus  STUDIES:  2/12  CTA of the chest >> thrombosis in the distal right main pulmonary artery extending into lower lobe branches, no right heart strain, concern for possible mass in the right infrahilar region causing narrowing of the right lower lobe bronchus with post-obstructive changes, right sided pleural thickening, upper abdominal ascites with probable hepatic cirrhosis.   Attending Note:  ASSESSMENT / PLAN:  66 year old male with PMH of COPD who quit smoking 8 years prior to presentation who comes to the  ED with the chief complaint of SOB.  CTA was performed which revealed a thrombosis of the distal right pulmonary artery and a lung mass.  The patient was started on heparin and PCCM was called to evaluate for thrombus and mass. Case reviewed with CVTS per Dr. Molli Knock (concern with pleural thickening for possible Mesothelioma).   Lung mass: - arranged for EBUS on  - Dr. Jamison Neighbor to perform EBUS on Friday 2/16 at 0730 am - NPO after MN on 2/16  Right  pulmonary artery thrombus: - continue heparin gtt per pharmacy  - will need long term anticoagulation but do not transition until EBUS completed - will need to turn heparin off at least 6 hours prior to procedure and assess a PTT am of EBUS  Acute on Chronic Hypoxic Respiratory Failure - O2 to support sats 88-95% - Reassess ambulatory desaturation prior to discharge  COPD with Acute Exacerbation  - adjust pulmicort to 0.5 mg BID  - add Brovana BID  - Solumedrol 40 mg BID   Pleural effusion and pleural calcification - No role for thora at this time  - follow serial imaging  - hold CVTS consult for now  Hyponatremia  - assess urine & serum osmolality   Canary Brim, NP-C [facility] Pgr: [phone] or if no answer [phone] 02/25/2016, 11:33 AM   STAFF NOTE: I, Rory Percy, MD FACP have personally reviewed patient's available data, including medical history, events of note, physical examination and test results as part of my evaluation. I have discussed with resident/NP and other care providers such as pharmacist, RN and RRT. In addition, I personally evaluated patient and elicited key findings of: awake, no distress, wheezing bilateral apical anterior lung fields, expiration moderate, CT reviewed hyponatremia also noted, concern lung cancer primary, wityh SIADH? And now worsening bronchospasm, appears that we ned to tune him up with reduction in bronchospasm now then proceed with ebus likely end of week, will max steroids, continued Bders, also assess serum osm in am and urine osm now, and recommend continued lasix, please do not use oral long acting anticoagualation, keep heparin for now short acting for safe EBUS   Mcarthur Rossetti. Tyson Alias, MD, FACP Pgr: [phone] [facility] 02/25/2016 2:14 PM

## 2016-02-25 NOTE — Progress Notes (Signed)
  Mixed venous saturation markedly depressed at 38.6%.   Will start on milrinone 0.25 mcg/kg/min.    With Afib, will also start on amiodarone gtt at 30/mg/hr for rate control while on milrinone. Will use judiciously and attempt to titrate to oral dosing as able with ongoing shortage.  Discussed with MD, Dr. Shirlee Latch.  Casimiro Needle [address]" [place], PA-C 02/25/2016 4:02 PM

## 2016-02-25 NOTE — Progress Notes (Signed)
Peripherally Inserted Central Catheter/Midline Placement  The IV Nurse has discussed with the patient and/or persons authorized to consent for the patient, the purpose of this procedure and the potential benefits and risks involved with this procedure.  The benefits include less needle sticks, lab draws from the catheter, and the patient may be discharged home with the catheter. Risks include, but not limited to, infection, bleeding, blood clot (thrombus formation), and puncture of an artery; nerve damage and irregular heartbeat and possibility to perform a PICC exchange if needed/ordered by physician.  Alternatives to this procedure were also discussed.  Bard Power PICC patient education guide, fact sheet on infection prevention and patient information card has been provided to patient /or left at bedside.    PICC/Midline Placement Documentation        Max Rivas 02/25/2016, 9:15 AM

## 2016-02-26 LAB — BASIC METABOLIC PANEL
Anion gap: 11 (ref 5–15)
BUN: 35 mg/dL — ABNORMAL HIGH (ref 6–20)
CALCIUM: 8.7 mg/dL — AB (ref 8.9–10.3)
CO2: 31 mmol/L (ref 22–32)
Chloride: 90 mmol/L — ABNORMAL LOW (ref 101–111)
Creatinine, Ser: 1.48 mg/dL — ABNORMAL HIGH (ref 0.61–1.24)
GFR, EST AFRICAN AMERICAN: 56 mL/min — AB (ref >60)
GFR, EST NON AFRICAN AMERICAN: 49 mL/min — AB (ref >60)
Glucose, Bld: 240 mg/dL — ABNORMAL HIGH (ref 65–99)
Potassium: 3.1 mmol/L — ABNORMAL LOW (ref 3.5–5.1)
Sodium: 132 mmol/L — ABNORMAL LOW (ref 135–145)

## 2016-02-26 LAB — CBC
HEMATOCRIT: 43.3 % (ref 39.0–52.0)
HEMATOCRIT: 43.9 % (ref 39.0–52.0)
Hemoglobin: 14.3 g/dL (ref 13.0–17.0)
Hemoglobin: 14.4 g/dL (ref 13.0–17.0)
MCH: 31.3 pg (ref 26.0–34.0)
MCH: 31.3 pg (ref 26.0–34.0)
MCHC: 33 g/dL (ref 30.0–36.0)
MCHC: 32.8 g/dL (ref 30.0–36.0)
MCV: 94.7 fL (ref 78.0–100.0)
MCV: 95.4 fL (ref 78.0–100.0)
PLATELETS: 143 10*3/uL — AB (ref 150–400)
Platelets: 145 10*3/uL — ABNORMAL LOW (ref 150–400)
RBC: 4.57 MIL/uL (ref 4.22–5.81)
RBC: 4.6 MIL/uL (ref 4.22–5.81)
RDW: 15.8 % — AB (ref 11.5–15.5)
RDW: 15.9 % — AB (ref 11.5–15.5)
WBC: 5.6 10*3/uL (ref 4.0–10.5)
WBC: 5.7 10*3/uL (ref 4.0–10.5)

## 2016-02-26 LAB — COOXEMETRY PANEL
CARBOXYHEMOGLOBIN: 1.4 % (ref 0.5–1.5)
Methemoglobin: 1 % (ref 0.0–1.5)
O2 SAT: 64 %
Total hemoglobin: 14.3 g/dL (ref 12.0–16.0)

## 2016-02-26 LAB — HEPARIN LEVEL (UNFRACTIONATED): HEPARIN UNFRACTIONATED: 0.58 [IU]/mL (ref 0.30–0.70)

## 2016-02-26 LAB — OSMOLALITY: Osmolality: 293 mOsm/kg (ref 275–295)

## 2016-02-26 MED ORDER — METHYLPREDNISOLONE SODIUM SUCC 40 MG IJ SOLR
40.0000 mg | INTRAMUSCULAR | Status: DC
  Administered 2016-02-26 – 2016-02-27 (×2): 40 mg via INTRAVENOUS
  Filled 2016-02-26 (×2): qty 1

## 2016-02-26 MED ORDER — METOPROLOL SUCCINATE ER 25 MG PO TB24
12.5000 mg | ORAL_TABLET | Freq: Every day | ORAL | Status: DC
  Administered 2016-02-26 – 2016-03-05 (×9): 12.5 mg via ORAL
  Filled 2016-02-26 (×9): qty 1

## 2016-02-26 MED ORDER — POTASSIUM CHLORIDE CRYS ER 20 MEQ PO TBCR
40.0000 meq | EXTENDED_RELEASE_TABLET | Freq: Two times a day (BID) | ORAL | Status: DC
  Administered 2016-02-26 (×2): 40 meq via ORAL
  Filled 2016-02-26 (×2): qty 2

## 2016-02-26 MED ORDER — METOLAZONE 5 MG PO TABS
2.5000 mg | ORAL_TABLET | Freq: Once | ORAL | Status: AC
  Administered 2016-02-26: 2.5 mg via ORAL
  Filled 2016-02-26: qty 1

## 2016-02-26 NOTE — Care Management Note (Addendum)
Case Management Note  Patient Details  Name: Max Rivas MRN: [id number] Date of Birth: 06-Nov-1950  Subjective/Objective:  Pt presented for Acute Respiratory Failure-SOB. Pt previously admitted for new dx of A Fib. Pt was initiated on Xarelto previous admission. This CM did provide pt with 30 day free card previous admission and he was supposed to get Rx filled at the [facility] on [facility]. Per pt he did not utilize the 30 day free card.  Pt is without insurance and has income via disability $1097.00 Pt uses the [facility]. PCP is Dr. Carlyle Basques.  Action/Plan: CM did reach out to the [facility] will have to call back due to no answer. Pt stated in order to get Xarelto via [facility]  he will have to go to a hour class. CM will call back to verify. [facility] will [facility] in [facility] to [facility] as well.    Expected Discharge Date:               Expected Discharge Plan:  Home/Self Care  In-House Referral:  NA  Discharge planning Services  CM Consult  Post Acute Care Choice:   Durable Medical Equipment, Home Health Choice offered to:   Patient  DME Arranged:   Oxygen DME Agency:   [facility]  HH Arranged:    RN [facility] [place] Agency:   [facility]  Status of Service:  Completed If discussed at Long Length of Stay Meetings, dates discussed:  03-03-16, 03-05-16  Additional Comments: CM was able to contact [employer] CSW at the [place]. Pt's CSW at the [place] is Seward Grater [phone] ex [phone]. Victorino Dike did state that pt has PCP Dr. Geralynn Rile. CM will fax information to the [place] @ [phone] and 02 orders to [facility] at [phone]. Vilinda Boehringer will contact [place] in [place]. Per pt he has all the rest of his medications @ home. [facility] to call patient to establish another hospital f/u appointment. No further needs from CM at this time.    1056 03-05-16 Tomi Bamberger, RN,BSN [phone] 30 day free  card provided to patient again to use. Pt aware to go to [facility] for [employer]. Pt will need Rx for starter pack. Pt previously on 02 [place]L [place] [facility] [place]  Roommate to bring 02 tank for travel home. CM did call [facility] and new order with increased liter flow to be sent to the [facility] office. CM did offer agency choice for [facility] and pt chose [facility]- CM to call referral to [facility]. Lupita Leash to check to see if patient is appropriate for [facility] care. If so SOC to begin within 24-48 hours post discharge.     1644 03-03-16 Tomi Bamberger, RN,BSN [phone] Isidoro Donning RN CM did contact [facility]# [phone] ext [phone], left message for Victorino Dike CSW for follow up on possible arranging [facility] RN. CM also asked for CSW to verify if pt would need an hour class to be on Xarelto. Waiting return phone call.    1435 02-27-16 Tomi Bamberger, RN, BSN [phone]  CM did call [facility] to clarify if class was needed before the [facility] could [facility] in [facility] of [facility]. [facility] [employer] Class was scheduled- pt had an appointment on 02-26-16. Pharmacy call center was unable to assist CM with information in regards to Xarelto. CM awaiting call back.    1225 02-27-16  Tomi Bamberger, RN,BSN [phone] CM did  call Pharmacy and secure voicemail left in regards to Xarelto. Awaiting call back.  Gala Lewandowsky, RN 02/26/2016, 11:41 AM

## 2016-02-26 NOTE — Progress Notes (Signed)
Pt's 02 sat maintaining at 84%on 6L nasal cannula, while resting in bed. Pt denies feeling SOB. Alert x4. Respiratory called. Pt placed on high flow [place] 10L and 02 sats came up to 90-92%. MD on call paged. Will cont to monitor pt.

## 2016-02-26 NOTE — Progress Notes (Signed)
Name: Max Rivas MRN: [id number] DOB: 1950-03-01    ADMISSION DATE:  02/24/2016 CONSULTATION DATE:  02/24/16  REFERRING MD :  Dr. Susie Cassette   CHIEF COMPLAINT:  Shortness of Breath    BRIEF SUMMARY:  66 y/o M, current smoker, who presented to [facility] on 2/12 with a 48 hour history of shortness of breath.    The patient was recently admitted 1/3-01/19/16 for a CHF decompensation and new dx of AF dischared on Xarelto (has not taken as he was to attend a class at the [place] before he could fill the Rx) and O2.  He reported on admit that he woke up with chest pain and shortness of breath.  He put his home O2 on at 2L without relief.  He initially reported his pain as 7/10.  He also noted he had been having abdominal pain and generalized swelling over the last two weeks without relief from lasix.  He endorses that he has been taking his blood pressure medication as prescribed.  The patient was admitted per [facility] for further assessment.  CXR demonstrated cardiomegaly with interstitial edema, bilateral pleural effusions and basilar atelectasis.  The patient had hypotension and an elevated d-Dimer at 3.99.  Follow up CTA of the chest was obtained which demonstrated thrombosis in the distal right main pulmonary artery extending into lower lobe branches, no right heart strain, concern for possible mass in the right infrahilar region causing narrowing of the right lower lobe bronchus with post-obstructive changes, right sided pleural thickening, upper abdominal ascites with probable hepatic cirrhosis.  Labs - Na 128, K 4.2, Cl 92, Sr Cr 1.47, AG 13, Albumin 3.1, troponin 0.03, BNP 1633, WBC 8.8, Hgb 15.9 and platelets 170.    He has a known medical hx of CHF, CAD s/p MIx5, Pacemaker, COPD and GERD.    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    SUBJECTIVE:  Patient reports feeling much better - endorses increased  UOP, decreased WOB   VITAL SIGNS: Temp:  [97.5 F (36.4 C)] 97.5 F (36.4 C) (02/14 0450) Pulse Rate:  [28-105] 105 (02/14 0943) Resp:  [16-22] 18 (02/14 0943) BP: (96-111)/(55-97) 110/97 (02/14 0943) SpO2:  [89 %-100 %] 90 % (02/14 0943) Weight:  [261 lb 3.2 oz (118.5 kg)-262 lb (118.8 kg)] 262 lb (118.8 kg) (02/14 0735)  PHYSICAL EXAMINATION: General:  Obese male in NAD HEENT: MM pink/moist PSY: normal mood / affect  Neuro: AAOx4, MAE CV: s1s2 rrr, no m/r/g PULM: even/non-labored, lungs bilaterally clear, no wheezing YV:OPFY, non-tender, bsx4 active  Extremities: warm/dry, 2-3+ pitting BLE edema  Skin: no rashes or lesions    Recent Labs Lab 02/24/16 0621 02/25/16 0254 02/26/16 0500  NA 128* 127* 132*  K 4.2 4.9 3.1*  CL 92* 94* 90*  CO2 23 22 31   BUN 32* 35* 35*  CREATININE 1.47* 1.71* 1.48*  GLUCOSE 101* 136* 240*     Recent Labs Lab 02/25/16 0254 02/26/16 0500 02/26/16 0818  HGB 16.3 14.3 14.4  HCT 49.7 43.3 43.9  WBC 8.8 5.6 5.7  PLT 160 143* 145*    Ct Soft Tissue Neck W Contrast  Result Date: 02/25/2016 CLINICAL DATA:  Lymphadenopathy EXAM: CT NECK WITH CONTRAST TECHNIQUE: Multidetector CT imaging of the neck was performed using the standard protocol following the bolus administration of intravenous contrast. CONTRAST:  1mL ISOVUE-300 IOPAMIDOL (ISOVUE-300) INJECTION 61% COMPARISON:  None. FINDINGS: Pharynx  and larynx: The nasopharynx is clear. The oropharynx and hypopharynx are normal. The epiglottis, supraglottic larynx, glottis and subglottic larynx are normal. No retropharyngeal collection. The parapharyngeal spaces are preserved. The visible oral cavity and base of tongue are normal. Salivary glands: The parotid and submandibular glands are normal. No sialolithiasis or salivary ductal dilatation. Thyroid: Normal Lymph nodes: There is no enlarged or abnormal density cervical lymph node. Vascular: There is aortic atherosclerosis. There is bilateral  atherosclerotic plaque at the carotid bifurcations, right worse than left. Limited intracranial: Normal. Visualized orbits: Normal. Mastoids and visualized paranasal sinuses: Clear Skeleton: There are no lytic or blastic osseous lesions. No bony spinal canal stenosis. Upper chest: There is a medium-sized left pleural effusion. There is a large, partially calcified pleural plaque along the lateral right pleural surface. There is biapical emphysema. No pulmonary nodules or masses. Other: There is a a right-sided PICC line with tip below the field of view. IMPRESSION: 1. No cervical lymphadenopathy or mass. 2. Partially calcified right lateral pleural plaque, possibly reflecting prior asbestos exposure. 3. Medium-sized left pleural effusion. Electronically Signed   By: Deatra Robinson M.D.   On: 02/25/2016 22:19   Dg Chest Port 1 View  Result Date: 02/25/2016 CLINICAL DATA:  Status post right-sided PICC line placement. Assess positioning of the tip. EXAM: PORTABLE CHEST 1 VIEW COMPARISON:  Chest x-ray of February 24 2016. FINDINGS: The PICC line tip projects over the junction of the middle and distal thirds of the SVC. No postprocedure pneumothorax is observed. There are bilateral pleural effusions which are stable. The cardiac silhouette is enlarged. The pulmonary vascularity is engorged. The ICD is in stable position where visualized. IMPRESSION: The tip of the PICC line projects over the junction of the middle and distal thirds of the SVC. Electronically Signed   By: David  Swaziland M.D.   On: 02/25/2016 10:26      SIGNIFICANT EVENTS  2/12  Admit with SOB, hypotension/elevated D-dimer > CTA chest with concern for mass, R main pulm art thrombus  STUDIES:  2/12  CTA of the chest >> thrombosis in the distal right main pulmonary artery extending into lower lobe branches, no right heart strain, concern for possible mass in the right infrahilar region causing narrowing of the right lower lobe bronchus with  post-obstructive changes, right sided pleural thickening, upper abdominal ascites with probable hepatic cirrhosis.  2/12  ECHO >> mildly dilated LV, systolic function severely reduced, LVED 25-30%, diffuse hypokinesis, akinesis of inferolateral myocardium & basalinferior myocardium, mild MR, RA mildly dilated, PA peak 39 mm Hg 2/14 CT Neck >> no cervical lymphadenopathy or mass, partially calcified right lateral pleural plaque, medium sized left pleural effusion   ASSESSMENT / PLAN:  66 year old male with PMH of COPD who quit smoking 8 years prior to presentation who comes to the ED with the chief complaint of SOB.  CTA was performed which revealed a thrombosis of the distal right pulmonary artery and a lung mass.  The patient was started on heparin and PCCM was called to evaluate for thrombus and mass. Case reviewed with CVTS per Dr. Molli Knock (concern with pleural thickening for possible Mesothelioma).   Lung mass with Mediastinal Adenopathy: - EBUS canceled for Friday 2/16 - no cervical LAN on CT > will review imaging with attending MD.  Suspect this will need FOB vs EBUS for biopsy once he is medically optimized.    Right pulmonary artery thrombus: - continue heparin per pharmacy  - will need long term  anticoagulation but do not transition until biopsy completed - will need to turn heparin gtt off at least 6 hours prior to planned biopsy once scheduled   Acute on Chronic Hypoxic Respiratory Failure - O2 to support sats 88-95% - Reassess ambulatory O2 needs prior to discharge  COPD with Acute Exacerbation  - continue pulmicort + brovana - decrease solumedrol to 40 mg QD  Pleural effusion and pleural calcification - monitor effusion on imaging, no role for thora at this time - follow serial imaging   Hyponatremia  -  U. Osmol 303, serum Osmol 293  Canary Brim, NP-C [facility] Pgr: [phone] or if no answer [phone] 02/26/2016, 10:38 AM   STAFF NOTE: I,  Rory Percy, MD FACP have personally reviewed patient's available data, including medical history, events of note, physical examination and test results as part of my evaluation. I have discussed with resident/NP and other care providers such as pharmacist, RN and RRT. In addition, I personally evaluated patient and elicited key findings of: awake, non labored, in bed, improved resp status and wheezing, he was negative 3.8 liters and steroids were started 2/13, will reduce steroids, maintain  Neg balance as he tolerated well and clinically improved, neck CT neg and I had never felt any lymph nodes on examination, his medical status precludes Bx / ebus on Friday, need to continued to improve his cardio-pulm status and hope for ebus next week, will follow up   Mcarthur Rossetti. Tyson Alias, MD, FACP Pgr: [phone] [facility] 02/26/2016 3:36 PM

## 2016-02-26 NOTE — Progress Notes (Signed)
Pt resting in bed and denies SOB. 02 sats fluctuating between 88-93% on 6L nasal cannula. Md aware. Will cont to monitor pt.

## 2016-02-26 NOTE — Progress Notes (Signed)
Pharmacist Heart Failure Core Measure Documentation  Assessment: Max Rivas has an EF documented as25-30% on 02/24/16 by ECHO.  Rationale: Heart failure patients with left ventricular systolic dysfunction (LVSD) and an EF < 40% should be prescribed an angiotensin converting enzyme inhibitor (ACEI) or angiotensin receptor blocker (ARB) at discharge unless a contraindication is documented in the medical record.  This patient is not currently on an ACEI or ARB for HF.  This note is being placed in the record in order to provide documentation that a contraindication to the use of these agents is present for this encounter.  ACE Inhibitor or Angiotensin Receptor Blocker is contraindicated (specify all that apply)  []   ACEI allergy AND ARB allergy []   Angioedema []   Moderate or severe aortic stenosis []   Hyperkalemia []   Hypotension []   Renal artery stenosis [x]   Worsening renal function, preexisting renal disease or dysfunction  Noah Delaine, RPh Clinical Pharmacist  02/26/2016 5:12 PM

## 2016-02-26 NOTE — Progress Notes (Signed)
Patient ID: Max Rivas, male   DOB: [birth year], 66 y.o.   MRN: [id number]    SUBJECTIVE: Co-ox low on 2/13, milrinone started.  Co-ox 64% today.  He is now on amiodarone for rate control.  I decreased Toprol XL to 12.5 daily.  He diuresed well yesterday by I/Os but think weights not accurate. Breathing somewhat improved but CVP still high.   Scheduled Meds: . arformoterol  15 mcg Nebulization BID  . aspirin EC  81 mg Oral Daily  . atorvastatin  20 mg Oral q1800  . budesonide  0.5 mg Inhalation BID  . digoxin  0.25 mg Oral Daily  . furosemide  80 mg Intravenous TID  . methylPREDNISolone (SOLU-MEDROL) injection  40 mg Intravenous Q12H  . metolazone  2.5 mg Oral Once  . metoprolol succinate  12.5 mg Oral Daily  . multivitamin with minerals  1 tablet Oral Daily  . omega-3 acid ethyl esters  1 g Oral BID  . pantoprazole  40 mg Oral Daily  . sodium chloride flush  10-40 mL Intracatheter Q12H  . spironolactone  12.5 mg Oral Daily   Continuous Infusions: . amiodarone 30 mg/hr (02/25/16 2219)  . heparin 1,550 Units/hr (02/25/16 2000)  . milrinone 0.25 mcg/kg/min (02/26/16 0331)   PRN Meds:.acetaminophen **OR** acetaminophen, levalbuterol, ondansetron **OR** ondansetron (ZOFRAN) IV, sodium chloride flush    Vitals:   02/25/16 2050 02/25/16 2217 02/26/16 0450 02/26/16 0735  BP: 111/79 107/66 (!) 104/55   Pulse: 89 91 81   Resp: 18 16 (!) 22   Temp:   97.5 F (36.4 C)   TempSrc:   Oral   SpO2: 98% 100% 93% (!) 89%  Weight:   261 lb 3.2 oz (118.5 kg)   Height:        Intake/Output Summary (Last 24 hours) at 02/26/16 0738 Last data filed at 02/26/16 0500  Gross per 24 hour  Intake           598.94 ml  Output             4450 ml  Net         -3851.06 ml    LABS: Basic Metabolic Panel:  Recent Labs  01/20/30 0621 02/25/16 0254  NA 128* 127*  K 4.2 4.9  CL 92* 94*  CO2 23 22  GLUCOSE 101* 136*  BUN 32* 35*  CREATININE 1.47* 1.71*  CALCIUM 8.8* 8.8*  MG 2.1  --     Liver Function Tests:  Recent Labs  02/24/16 0621 02/25/16 0254  AST 30 31  ALT 35 35  ALKPHOS 92 96  BILITOT 2.3* 1.6*  PROT 6.1* 6.6  ALBUMIN 3.1* 3.1*   No results for input(s): LIPASE, AMYLASE in the last 72 hours. CBC:  Recent Labs  02/24/16 0108 02/24/16 0621 02/25/16 0254  WBC 9.9 8.8 8.8  NEUTROABS 6.4 5.4  --   HGB 16.1 15.9 16.3  HCT 48.2 48.0 49.7  MCV 94.1 94.9 95.4  PLT 175 170 160   Cardiac Enzymes:  Recent Labs  02/24/16 0621 02/24/16 1217 02/24/16 1742  TROPONINI <0.03 <0.03 <0.03   BNP: Invalid input(s): POCBNP D-Dimer:  Recent Labs  02/24/16 0108  DDIMER 3.99*   Hemoglobin A1C: No results for input(s): HGBA1C in the last 72 hours. Fasting Lipid Panel: No results for input(s): CHOL, HDL, LDLCALC, TRIG, CHOLHDL, LDLDIRECT in the last 72 hours. Thyroid Function Tests:  Recent Labs  02/24/16 0621  TSH 8.811*   Anemia Panel: No results  for input(s): VITAMINB12, FOLATE, FERRITIN, TIBC, IRON, RETICCTPCT in the last 72 hours.  RADIOLOGY: Ct Soft Tissue Neck W Contrast  Result Date: 02/25/2016 CLINICAL DATA:  Lymphadenopathy EXAM: CT NECK WITH CONTRAST TECHNIQUE: Multidetector CT imaging of the neck was performed using the standard protocol following the bolus administration of intravenous contrast. CONTRAST:  75mL ISOVUE-300 IOPAMIDOL (ISOVUE-300) INJECTION 61% COMPARISON:  None. FINDINGS: Pharynx and larynx: The nasopharynx is clear. The oropharynx and hypopharynx are normal. The epiglottis, supraglottic larynx, glottis and subglottic larynx are normal. No retropharyngeal collection. The parapharyngeal spaces are preserved. The visible oral cavity and base of tongue are normal. Salivary glands: The parotid and submandibular glands are normal. No sialolithiasis or salivary ductal dilatation. Thyroid: Normal Lymph nodes: There is no enlarged or abnormal density cervical lymph node. Vascular: There is aortic atherosclerosis. There is  bilateral atherosclerotic plaque at the carotid bifurcations, right worse than left. Limited intracranial: Normal. Visualized orbits: Normal. Mastoids and visualized paranasal sinuses: Clear Skeleton: There are no lytic or blastic osseous lesions. No bony spinal canal stenosis. Upper chest: There is a medium-sized left pleural effusion. There is a large, partially calcified pleural plaque along the lateral right pleural surface. There is biapical emphysema. No pulmonary nodules or masses. Other: There is a a right-sided PICC line with tip below the field of view. IMPRESSION: 1. No cervical lymphadenopathy or mass. 2. Partially calcified right lateral pleural plaque, possibly reflecting prior asbestos exposure. 3. Medium-sized left pleural effusion. Electronically Signed   By: Deatra Robinson M.D.   On: 02/25/2016 22:19   Ct Angio Chest Pe W And/or Wo Contrast  Result Date: 02/24/2016 CLINICAL DATA:  Hypotension with positive D-dimer. EXAM: CT ANGIOGRAPHY CHEST WITH CONTRAST TECHNIQUE: Multidetector CT imaging of the chest was performed using the standard protocol during bolus administration of intravenous contrast. Multiplanar CT image reconstructions and MIPs were obtained to evaluate the vascular anatomy. CONTRAST:  100 mL Isovue 370 COMPARISON:  None. FINDINGS: Cardiovascular: Large filling defect beginning in the distal right main pulmonary artery and extending into the lower lobe branches. No flow is demonstrated in right lower lobe branches. Appearance is consistent with pulmonary arterial thrombus or embolus. Cardiac enlargement. RV to LV ratio is less than 1, suggesting no evidence of right heart strain. Reflux of contrast material into the hepatic veins is consistent with right heart failure. No pericardial effusion. Calcification of the thoracic aorta and coronary arteries. Mediastinum/Nodes: Scattered lymph nodes are present throughout the mediastinum and in both hilar regions. Right paratracheal lymph  nodes measure about 1.8 cm short axis dimension. Appearance is nonspecific. These may be reactive nodes although pathologic lymphadenopathy is not excluded. Lungs/Pleura: Diffuse right-sided pleural thickening with pleural calcifications. Bilateral pleural effusions. Basilar atelectasis bilaterally. Emphysematous changes in the lungs. Right upper lung nodule with mild spiculation measures 7 mm diameter. There is effacement and narrowing of the right lower lobe bronchus with postobstructive consolidation. Suspicion of a a right infrahilar mass measuring 3.4 cm diameter. This could also be causing narrowing of the right lower lobe pulmonary artery, resulting in the thrombosis. Upper Abdomen: Upper abdominal ascites is present. Enlarged lateral segment of the left lobe of the liver suggests cirrhosis. Diffuse soft tissue edema. Musculoskeletal: No chest wall abnormality. No acute or significant osseous findings. Review of the MIP images confirms the above findings. IMPRESSION: Thrombosis in the distal right main pulmonary artery and extending into lower lobe branches. No right heart strain. I am suspicious that there is a mass in the right infrahilar  region causing narrowing of the right lower lobe bronchus and associated postobstructive change. This mass may also be compressing the right pulmonary artery. Indeterminate 7 mm nodule in the right upper lobe. Indeterminate borderline enlarged hilar and mediastinal lymph nodes. Bilateral pleural effusions with basilar atelectasis. Right-sided pleural thickening and pleural calcifications. Upper abdominal ascites with probable hepatic cirrhosis. These results were called by telephone at the time of interpretation on 02/24/2016 at 3:14 am to Dr. Ross Marcus , who verbally acknowledged these results. Electronically Signed   By: Burman Nieves M.D.   On: 02/24/2016 03:17   Dg Chest Port 1 View  Result Date: 02/25/2016 CLINICAL DATA:  Status post right-sided PICC line  placement. Assess positioning of the tip. EXAM: PORTABLE CHEST 1 VIEW COMPARISON:  Chest x-ray of February 24 2016. FINDINGS: The PICC line tip projects over the junction of the middle and distal thirds of the SVC. No postprocedure pneumothorax is observed. There are bilateral pleural effusions which are stable. The cardiac silhouette is enlarged. The pulmonary vascularity is engorged. The ICD is in stable position where visualized. IMPRESSION: The tip of the PICC line projects over the junction of the middle and distal thirds of the SVC. Electronically Signed   By: David  Swaziland M.D.   On: 02/25/2016 10:26   Dg Chest Portable 1 View  Result Date: 02/24/2016 CLINICAL DATA:  Chest pain and shortness of breath.  Home oxygen. EXAM: PORTABLE CHEST 1 VIEW COMPARISON:  01/15/2016 FINDINGS: Cardiac pacemaker. Cardiac enlargement without significant pulmonary vascular congestion. Perihilar interstitial changes suggesting edema. Bilateral pleural effusions with basilar atelectasis, greater on the right. Appearances are similar to previous study. No pneumothorax. Mediastinal contours appear intact. IMPRESSION: Cardiac enlargement with interstitial edema, bilateral pleural effusions, and basilar atelectasis, similar to the prior study. Electronically Signed   By: Burman Nieves M.D.   On: 02/24/2016 01:47    PHYSICAL EXAM General: NAD Neck: JVP 16 cm, no thyromegaly or thyroid nodule.  Lungs: Bilateral rhonchi.  CV: Nondisplaced PMI.  Heart mildly tachy, irregular S1/S2, no S3/S4, no murmur.  2+ edema into thighs.  No carotid bruit.  Normal pedal pulses.  Abdomen: Soft, nontender, no hepatosplenomegaly, no distention.  Neurologic: Alert and oriented x 3.  Psych: Normal affect. Extremities: No clubbing or cyanosis.   TELEMETRY: Reviewed telemetry pt in atrial fibrillation 90s-100s  ASSESSMENT AND PLAN: Patient has long history of ischemic cardiomyopathy, EF 25-30% by echo 2/12 (consistent with past).  He  remains in atrial fibrillation with controlled rate.  He has a substantial PE.  He also has a lung mass concerning for cancer and started back smoking a few days ago.  He came to the hospital because of dyspnea with minimal exertion and swelling.  1. Acute on chronic systolic CHF: Echo this admission with EF 25-30%, mild LV dilation, moderate MR, PASP 39 mmHg, RV moderately dilated/moderately dysfunction.  Ischemic cardiomyopathy, has MDT ICD.  NYHA class IIIb symptoms, marked volume overload on exam.  Exacerbation likely triggered by ongoing atrial fibrillation and RV failure made worse by PE.  Milrinone started with low co-ox, now 64%.  He diuresed very well after getting milrinone.  No labs yet this morning.  Still volume overloaded by exam and CVP.  - Send BMET stat.   - Lasix 80 mg IV every 8 hrs, will give a dose of metolazone 2.5 x 1.   - Spironolactone 12.5 daily.  - Toprol XL cut back to 12.5 daily with low output.  - Continue digoxin,  level ok.  2. PE: New diagnosis, noted in distal right main.  Trigger may be right infrahilar mass/?lung cancer.   - Heparin gtt for now given possible need for procedures, pulmonary following.  Would favor DOAC eventually.  3. Atrial fibrillation: Persistent since 1/18 admission.  Rate is reasonable controlled on digoxin and Toprol XL (HR 90s-100s).  Was not able to get Xarelto from the [place], unfortunately it appears that there is an hour long class that has to be attended before Xarelto will be dispensed, and he had not had time to go to the class. In the interim, he had a PE.   - Now on amiodarone gtt for rate control while on milrinone.  - Cut back Toprol XL to 12.5 daily.   - Heparin gtt for now, would favor DOAC use eventually (hopefully can get this outside the [place] for him).  - Eventually would favor DCCV attempt. However, would not do DCCV at this time with fresh PE.  Would give some time for PE to stabilize/resolve (possible DCCV in a month).  4. Lung  mass: Right infrahilar.  Possible lung cancer, pulmonary following, may have lymph node biopsy in neck today.  He has recently resumed smoking, needs to quit.  5. Cirrhosis: CT concerning for possible hepatic cirrhosis.  6. CKD: Pending creatinine. 7. Hyponatremia: If Na falls any farther, will give tolvaptan.  Fluid restrict.  8. COPD exacerbation: Continue Solumedrol and nebs.  9. Pleural calcification.   Marca Ancona 02/26/2016 7:38 AM

## 2016-02-26 NOTE — Progress Notes (Signed)
ANTICOAGULATION CONSULT NOTE  Pharmacy Consult for Heparin Indication: pulmonary embolus  Allergies  Allergen Reactions  . Max Rivas [Rosuvastatin Calcium] Other (See Comments)    Back spasms    Patient Measurements: Height: 5\' 11"  (180.3 cm) Weight: 262 lb (118.8 kg) IBW/kg (Calculated) : 75.3  Ht: 71 in Wt: 109 kg  IBW: 75 kg Heparin Dosing Weight: 99 klg  Vital Signs: Temp: 97.5 F (36.4 C) (02/14 0450) Temp Source: Oral (02/14 0450) BP: 104/55 (02/14 0450) Pulse Rate: 81 (02/14 0450)  Labs:  Recent Labs  02/24/16 0621  02/24/16 1217 02/24/16 1742 02/25/16 0252 02/25/16 0254 02/26/16 0500 02/26/16 0745 02/26/16 0818  HGB 15.9  --   --   --   --  16.3 14.3  --  14.4  HCT 48.0  --   --   --   --  49.7 43.3  --  43.9  PLT 170  --   --   --   --  160 143*  --  145*  HEPARINUNFRC  --   < > 0.39 0.46 0.68  --   --  0.58  --   CREATININE 1.47*  --   --   --   --  1.71* 1.48*  --   --   TROPONINI <0.03  --  <0.03 <0.03  --   --   --   --   --   < > = values in this interval not displayed.  Estimated Creatinine Clearance: 67 mL/min (by C-G formula based on SCr of 1.48 mg/dL (H)).   Assessment: 65 YOM with history of Afib prescribed Xarelto PTA but he couldn't start until he finishes an education class at the [facility]  Patient presented with a PE and started in IV heparin.  Heparin level is therapeutic and cbc has remained stable.   Goal of Therapy:  Heparin level 0.3-0.7 units/ml Monitor platelets by anticoagulation protocol: Yes    Plan:  - Continue heparin gtt at 1550 units/hr - Daily heparin level and CBC - F/U with transitioning patient to PO anticoagulation  [name] PharmD., BCPS Clinical Pharmacist Pager [phone] 02/26/2016 9:20 AM

## 2016-02-26 NOTE — Progress Notes (Signed)
Patient ID: Max Rivas, male   DOB: [birth year], 66 y.o.   MRN: [id number]  PROGRESS NOTE    VINNIE DIONNE  WPY:[id number] DOB: 1950-05-06 DOA: 02/24/2016  PCP: Jyl Heinz, MD   Brief Narrative:  66 year old male with past medical history of ischemic cardiomyopathy (EF 25-30% by ECHO in 2012), recent hospitalization for acute decompensated CHF and atrial fibrillation on xarelto, PE and lung mass concerning for cancer. He presented to ED with shortness of breath at rest with exertion as well as leg swelling for 48 hours prior to this admission. Pt also had chest pain and associated productive cough, fever and chills.  D dimer was positive and CT angio chest was subsequently done and showed right pulmonary artery thrombus and possible lung mass and bilateral pleural effusion. Xarelto on discharge in 01/2016 was prescribed but pt never took it as he had to attend class in [place] before obtaining the medication.   Assessment & Plan:  Acute respiratory failure with hypoxia secondary to acute on chronic systolic heart failure / Acute pulmonary embolism / Acute COPD exacerbation  - 2 D ECHO on this admission showed EF 25%, diffuse hypokinesis - Continue Lasix 80 mg IV Q 8 hours  - Continue solumedrol 40 mg Q 24 hours  - Continue brovana and Pulmicort BID nebulizer  - Appreciate pulmonary following   Right-sided pulmonary thrombus with possible lung mass - Continue heparin drip  - Plan for EBUS on Friday 2/16  Atrial fibrillation with RVR  - CHADS vasc score 2 - Continue anticoagulation with heparin drip - Continue amiodarone drip and metoprolol  - Continue digoxin  Essential hypertension - Continue metoprolol 12.5 mg daily   Acute on chronic systolic CHF - Continue lasix 80 mg Q 8 hours - Continue spironolactone   Dyslipidemia - Continue Lipitor and omega 3 supplementation   Hyponatremia - Possibly from acute lung process, possible malignancy - Sodium 128 --> 132  Chronic  kidney disease stage 3 - Baseline Cr 1.55 about 1 month ago - Cr 1.48  Elevated TSH - TSH 8.8 on this admission - Needs follow up in outpt setting    DVT prophylaxis: Heparin drip Code Status: DNR/DNI Family Communication: no family at the bedside this am Disposition Plan: EBUS Friday   Consultants:   Cardio  PCCM  Procedures:   None  Antimicrobials:   None    Subjective: No overnight events.  Objective: Vitals:   02/25/16 2217 02/26/16 0450 02/26/16 0735 02/26/16 0943  BP: 107/66 (!) 104/55  (!) 110/97  Pulse: 91 81  (!) 105  Resp: 16 (!) 22  18  Temp:  97.5 F (36.4 C)    TempSrc:  Oral    SpO2: 100% 93% (!) 89% 90%  Weight:  118.5 kg (261 lb 3.2 oz) 118.8 kg (262 lb)   Height:        Intake/Output Summary (Last 24 hours) at 02/26/16 1303 Last data filed at 02/26/16 1200  Gross per 24 hour  Intake          1058.94 ml  Output             4700 ml  Net         -3641.06 ml   Filed Weights   02/25/16 0346 02/26/16 0450 02/26/16 0735  Weight: 115.3 kg (254 lb 1.6 oz) 118.5 kg (261 lb 3.2 oz) 118.8 kg (262 lb)    Examination:  General exam: Appears calm and comfortable, no distress  Respiratory system: Bilateral wheezing, no rhonchi  Cardiovascular system: S1 & S2 heard, Rate controlled  Gastrointestinal system: (+) BS, non tender abd  Central nervous system: No focal neurological deficits. Extremities: Symmetric 5 x 5 power. No tenderness  Skin: +2 LE pitting edema, palpable pulses  Psychiatry: Normal mood and behavior   Data Reviewed: I have personally reviewed following labs and imaging studies  CBC:  Recent Labs Lab 02/24/16 0108 02/24/16 0621 02/25/16 0254 02/26/16 0500 02/26/16 0818  WBC 9.9 8.8 8.8 5.6 5.7  NEUTROABS 6.4 5.4  --   --   --   HGB 16.1 15.9 16.3 14.3 14.4  HCT 48.2 48.0 49.7 43.3 43.9  MCV 94.1 94.9 95.4 94.7 95.4  PLT 175 170 160 143* 145*   Basic Metabolic Panel:  Recent Labs Lab 02/24/16 0108 02/24/16 0621  02/25/16 0254 02/26/16 0500  NA 127* 128* 127* 132*  K 4.6 4.2 4.9 3.1*  CL 94* 92* 94* 90*  CO2 22 23 22 31   GLUCOSE 120* 101* 136* 240*  BUN 32* 32* 35* 35*  CREATININE 1.37* 1.47* 1.71* 1.48*  CALCIUM 8.9 8.8* 8.8* 8.7*  MG  --  2.1  --   --    GFR: Estimated Creatinine Clearance: 67 mL/min (by C-G formula based on SCr of 1.48 mg/dL (H)). Liver Function Tests:  Recent Labs Lab 02/24/16 0621 02/25/16 0254  AST 30 31  ALT 35 35  ALKPHOS 92 96  BILITOT 2.3* 1.6*  PROT 6.1* 6.6  ALBUMIN 3.1* 3.1*   No results for input(s): LIPASE, AMYLASE in the last 168 hours. No results for input(s): AMMONIA in the last 168 hours. Coagulation Profile: No results for input(s): INR, PROTIME in the last 168 hours. Cardiac Enzymes:  Recent Labs Lab 02/24/16 0621 02/24/16 1217 02/24/16 1742  TROPONINI <0.03 <0.03 <0.03   BNP (last 3 results) No results for input(s): PROBNP in the last 8760 hours. HbA1C: No results for input(s): HGBA1C in the last 72 hours. CBG: No results for input(s): GLUCAP in the last 168 hours. Lipid Profile: No results for input(s): CHOL, HDL, LDLCALC, TRIG, CHOLHDL, LDLDIRECT in the last 72 hours. Thyroid Function Tests:  Recent Labs  02/24/16 0621  TSH 8.811*   Anemia Panel: No results for input(s): VITAMINB12, FOLATE, FERRITIN, TIBC, IRON, RETICCTPCT in the last 72 hours. Urine analysis: No results found for: COLORURINE, APPEARANCEUR, LABSPEC, PHURINE, GLUCOSEU, HGBUR, BILIRUBINUR, KETONESUR, PROTEINUR, UROBILINOGEN, NITRITE, LEUKOCYTESUR Sepsis Labs: @LABRCNTIP (procalcitonin:4,lacticidven:4)   ) Recent Results (from the past 240 hour(s))  MRSA PCR Screening     Status: None   Collection Time: 02/25/16  6:20 PM  Result Value Ref Range Status   MRSA by PCR NEGATIVE NEGATIVE Final    Comment:        The GeneXpert MRSA Assay (FDA approved for NASAL specimens only), is one component of a comprehensive MRSA colonization surveillance program.  It is not intended to diagnose MRSA infection nor to guide or monitor treatment for MRSA infections.       Radiology Studies: Ct Angio Chest Pe W And/or Wo Contrast Result Date: 02/24/2016 Thrombosis in the distal right main pulmonary artery and extending into lower lobe branches. No right heart strain. I am suspicious that there is a mass in the right infrahilar region causing narrowing of the right lower lobe bronchus and associated postobstructive change. This mass may also be compressing the right pulmonary artery. Indeterminate 7 mm nodule in the right upper lobe. Indeterminate borderline enlarged hilar and mediastinal lymph  nodes. Bilateral pleural effusions with basilar atelectasis. Right-sided pleural thickening and pleural calcifications. Upper abdominal ascites with probable hepatic cirrhosis. These results were called by telephone at the time of interpretation on 02/24/2016 at 3:14 am to Dr. Ross Marcus , who verbally acknowledged these results. Electronically Signed   By: Burman Nieves M.D.   On: 02/24/2016 03:17   Dg Chest Port 1 View Result Date: 02/25/2016 The tip of the PICC line projects over the junction of the middle and distal thirds of the SVC. Electronically Signed   By: David  [place] M.D.   On: 02/25/2016 10:26   Dg Chest Portable 1 View Cardiac enlargement with interstitial edema, bilateral pleural effusions, and basilar atelectasis, similar to the prior study. Electronically Signed   By: Burman Nieves M.D.   On: 02/24/2016 01:47      Scheduled Meds: . arformoterol  15 mcg Nebulization BID  . aspirin EC  81 mg Oral Daily  . atorvastatin  20 mg Oral q1800  . budesonide  0.5 mg Inhalation BID  . digoxin  0.25 mg Oral Daily  . furosemide  80 mg Intravenous TID  . methylPREDNISol  40 mg Intravenous Q12H  . metoprolol succi  75 mg Oral BID  . multivitamin with m  1 tablet Oral Daily  . omega-3 acid ethyl   1 g Oral BID  . pantoprazole  40 mg Oral Daily  .  spironolactone  12.5 mg Oral Daily   Continuous Infusions: . amiodarone 30 mg/hr (02/25/16 2219)  . heparin 1,550 Units/hr (02/25/16 2000)  . milrinone 0.25 mcg/kg/min (02/26/16 0331)     LOS: 2 days    Time spent: 15 minutes  Greater than 50% of the time spent on counseling and coordinating the care.   Manson Passey, MD [facility] Pager [phone]  If 7PM-7AM, please contact night-coverage [email or web address] Password [email or web address] 02/26/2016, 1:03 PM

## 2016-02-26 NOTE — Progress Notes (Signed)
Received call that Pt's BMET that was drawn at 08:00am had hemolyzed. Clarified with Denny Peon NP, ok not to re draw BMET. Will cont to monitor pt.

## 2016-02-27 LAB — BASIC METABOLIC PANEL
ANION GAP: 11 (ref 5–15)
BUN: 36 mg/dL — ABNORMAL HIGH (ref 6–20)
CALCIUM: 8.6 mg/dL — AB (ref 8.9–10.3)
CO2: 35 mmol/L — ABNORMAL HIGH (ref 22–32)
Chloride: 88 mmol/L — ABNORMAL LOW (ref 101–111)
Creatinine, Ser: 1.5 mg/dL — ABNORMAL HIGH (ref 0.61–1.24)
GFR, EST AFRICAN AMERICAN: 55 mL/min — AB (ref >60)
GFR, EST NON AFRICAN AMERICAN: 48 mL/min — AB (ref >60)
Glucose, Bld: 175 mg/dL — ABNORMAL HIGH (ref 65–99)
POTASSIUM: 3.4 mmol/L — AB (ref 3.5–5.1)
SODIUM: 134 mmol/L — AB (ref 135–145)

## 2016-02-27 LAB — CBC
HCT: 40.9 % (ref 39.0–52.0)
Hemoglobin: 13.3 g/dL (ref 13.0–17.0)
MCH: 30.7 pg (ref 26.0–34.0)
MCHC: 32.5 g/dL (ref 30.0–36.0)
MCV: 94.5 fL (ref 78.0–100.0)
PLATELETS: 148 10*3/uL — AB (ref 150–400)
RBC: 4.33 MIL/uL (ref 4.22–5.81)
RDW: 15.5 % (ref 11.5–15.5)
WBC: 10.5 10*3/uL (ref 4.0–10.5)

## 2016-02-27 LAB — COOXEMETRY PANEL
CARBOXYHEMOGLOBIN: 1.1 % (ref 0.5–1.5)
METHEMOGLOBIN: 0.9 % (ref 0.0–1.5)
O2 SAT: 74 %
TOTAL HEMOGLOBIN: 13.7 g/dL (ref 12.0–16.0)

## 2016-02-27 LAB — HEPARIN LEVEL (UNFRACTIONATED): HEPARIN UNFRACTIONATED: 0.65 [IU]/mL (ref 0.30–0.70)

## 2016-02-27 MED ORDER — METOLAZONE 5 MG PO TABS
2.5000 mg | ORAL_TABLET | Freq: Once | ORAL | Status: AC
  Administered 2016-02-27: 2.5 mg via ORAL
  Filled 2016-02-27: qty 1

## 2016-02-27 MED ORDER — SPIRONOLACTONE 25 MG PO TABS
25.0000 mg | ORAL_TABLET | Freq: Every day | ORAL | Status: DC
  Administered 2016-02-27 – 2016-03-01 (×4): 25 mg via ORAL
  Filled 2016-02-27 (×4): qty 1

## 2016-02-27 MED ORDER — PREDNISONE 20 MG PO TABS
20.0000 mg | ORAL_TABLET | Freq: Two times a day (BID) | ORAL | Status: DC
  Administered 2016-02-27 – 2016-03-05 (×14): 20 mg via ORAL
  Filled 2016-02-27 (×15): qty 1

## 2016-02-27 MED ORDER — POTASSIUM CHLORIDE CRYS ER 20 MEQ PO TBCR
60.0000 meq | EXTENDED_RELEASE_TABLET | Freq: Two times a day (BID) | ORAL | Status: DC
  Administered 2016-02-27 – 2016-03-03 (×11): 60 meq via ORAL
  Filled 2016-02-27 (×11): qty 3

## 2016-02-27 NOTE — Progress Notes (Signed)
ANTICOAGULATION CONSULT NOTE  Pharmacy Consult for Heparin Indication: pulmonary embolus  Allergies  Allergen Reactions  . Crestor [Rosuvastatin Calcium] Other (See Comments)    Back spasms    Patient Measurements: Height: 5\' 11"  (180.3 cm) Weight: 259 lb 3.2 oz (117.6 kg) IBW/kg (Calculated) : 75.3  Ht: 71 in Wt: 109 kg  IBW: 75 kg Heparin Dosing Weight: 99 klg  Vital Signs: Temp: 97.5 F (36.4 C) (02/15 1139) Temp Source: Oral (02/15 1139) BP: 113/74 (02/15 1139) Pulse Rate: 94 (02/15 1139)  Labs:  Recent Labs  02/24/16 1742 02/25/16 0252  02/25/16 0254 02/26/16 0500 02/26/16 0745 02/26/16 0818 02/27/16 0437 02/27/16 0438  HGB  --   --   < > 16.3 14.3  --  14.4  --  13.3  HCT  --   --   < > 49.7 43.3  --  43.9  --  40.9  PLT  --   --   < > 160 143*  --  145*  --  148*  HEPARINUNFRC 0.46 0.68  --   --   --  0.58  --  0.65  --   CREATININE  --   --   --  1.71* 1.48*  --   --   --  1.50*  TROPONINI <0.03  --   --   --   --   --   --   --   --   < > = values in this interval not displayed.  Estimated Creatinine Clearance: 65.7 mL/min (by C-G formula based on SCr of 1.5 mg/dL (H)).   Assessment: Max Rivas with history of Afib prescribed Xarelto PTA but he couldn't start until he finishes an education class at the [facility]  Patient presented with a PE and started in IV heparin.  Heparin drip rate 1550 uts/hr, heparin level 0.6 is therapeutic and cbc has remained stable.   Goal of Therapy:  Heparin level 0.3-0.7 units/ml Monitor platelets by anticoagulation protocol: Yes    Plan:  - Continue heparin gtt at 1550 units/hr - Daily heparin level and CBC - F/U with transitioning patient to PO anticoagulation  Leota Sauers Pharm.D. CPP, BCPS Clinical Pharmacist [phone] 02/27/2016 12:44 PM

## 2016-02-27 NOTE — Progress Notes (Signed)
Patient ID: Max Rivas, male   DOB: 16-Sep-1950, 66 y.o.   MRN: [id number]    SUBJECTIVE: Co-ox low on 2/13, milrinone started.  Co-ox 64% today.  He is now on amiodarone for rate control.  I decreased Toprol XL to 12.5 daily.  He diuresed well again yesterday and weight is down 3 lbs. Breathing somewhat improved but CVP still high, 13-16.  Co-ox 74%.  Creatinine stable.   Scheduled Meds: . arformoterol  15 mcg Nebulization BID  . aspirin EC  81 mg Oral Daily  . atorvastatin  20 mg Oral q1800  . budesonide  0.5 mg Inhalation BID  . digoxin  0.25 mg Oral Daily  . furosemide  80 mg Intravenous TID  . methylPREDNISolone (SOLU-MEDROL) injection  40 mg Intravenous Q24H  . metoprolol succinate  12.5 mg Oral Daily  . multivitamin with minerals  1 tablet Oral Daily  . omega-3 acid ethyl esters  1 g Oral BID  . pantoprazole  40 mg Oral Daily  . potassium chloride  60 mEq Oral BID  . sodium chloride flush  10-40 mL Intracatheter Q12H  . spironolactone  25 mg Oral Daily   Continuous Infusions: . amiodarone 30 mg/hr (02/27/16 0108)  . heparin 1,550 Units/hr (02/27/16 0340)  . milrinone 0.25 mcg/kg/min (02/27/16 0110)   PRN Meds:.acetaminophen **OR** acetaminophen, levalbuterol, ondansetron **OR** ondansetron (ZOFRAN) IV, sodium chloride flush    Vitals:   02/26/16 2042 02/27/16 0010 02/27/16 0317 02/27/16 0344  BP:  102/75 (!) 100/55   Pulse: 99 (!) 109  (!) 101  Resp: 18 19  17   Temp: 97.9 F (36.6 C) 97.9 F (36.6 C)  97.9 F (36.6 C)  TempSrc: Oral Oral  Oral  SpO2: 94% 90%  90%  Weight:    259 lb 3.2 oz (117.6 kg)  Height:        Intake/Output Summary (Last 24 hours) at 02/27/16 0727 Last data filed at 02/27/16 0400  Gross per 24 hour  Intake           2090.4 ml  Output             4300 ml  Net          -2209.6 ml    LABS: Basic Metabolic Panel:  Recent Labs  40/98/11 0500 02/27/16 0438  NA 132* 134*  K 3.1* 3.4*  CL 90* 88*  CO2 31 35*  GLUCOSE 240* 175*    BUN 35* 36*  CREATININE 1.48* 1.50*  CALCIUM 8.7* 8.6*   Liver Function Tests:  Recent Labs  02/25/16 0254  AST 31  ALT 35  ALKPHOS 96  BILITOT 1.6*  PROT 6.6  ALBUMIN 3.1*   No results for input(s): LIPASE, AMYLASE in the last 72 hours. CBC:  Recent Labs  02/26/16 0818 02/27/16 0438  WBC 5.7 10.5  HGB 14.4 13.3  HCT 43.9 40.9  MCV 95.4 94.5  PLT 145* 148*   Cardiac Enzymes:  Recent Labs  02/24/16 1217 02/24/16 1742  TROPONINI <0.03 <0.03   BNP: Invalid input(s): POCBNP D-Dimer: No results for input(s): DDIMER in the last 72 hours. Hemoglobin A1C: No results for input(s): HGBA1C in the last 72 hours. Fasting Lipid Panel: No results for input(s): CHOL, HDL, LDLCALC, TRIG, CHOLHDL, LDLDIRECT in the last 72 hours. Thyroid Function Tests: No results for input(s): TSH, T4TOTAL, T3FREE, THYROIDAB in the last 72 hours.  Invalid input(s): FREET3 Anemia Panel: No results for input(s): VITAMINB12, FOLATE, FERRITIN, TIBC, IRON, RETICCTPCT in the  last 72 hours.  RADIOLOGY: Ct Soft Tissue Neck W Contrast  Result Date: 02/25/2016 CLINICAL DATA:  Lymphadenopathy EXAM: CT NECK WITH CONTRAST TECHNIQUE: Multidetector CT imaging of the neck was performed using the standard protocol following the bolus administration of intravenous contrast. CONTRAST:  75mL ISOVUE-300 IOPAMIDOL (ISOVUE-300) INJECTION 61% COMPARISON:  None. FINDINGS: Pharynx and larynx: The nasopharynx is clear. The oropharynx and hypopharynx are normal. The epiglottis, supraglottic larynx, glottis and subglottic larynx are normal. No retropharyngeal collection. The parapharyngeal spaces are preserved. The visible oral cavity and base of tongue are normal. Salivary glands: The parotid and submandibular glands are normal. No sialolithiasis or salivary ductal dilatation. Thyroid: Normal Lymph nodes: There is no enlarged or abnormal density cervical lymph node. Vascular: There is aortic atherosclerosis. There is  bilateral atherosclerotic plaque at the carotid bifurcations, right worse than left. Limited intracranial: Normal. Visualized orbits: Normal. Mastoids and visualized paranasal sinuses: Clear Skeleton: There are no lytic or blastic osseous lesions. No bony spinal canal stenosis. Upper chest: There is a medium-sized left pleural effusion. There is a large, partially calcified pleural plaque along the lateral right pleural surface. There is biapical emphysema. No pulmonary nodules or masses. Other: There is a a right-sided PICC line with tip below the field of view. IMPRESSION: 1. No cervical lymphadenopathy or mass. 2. Partially calcified right lateral pleural plaque, possibly reflecting prior asbestos exposure. 3. Medium-sized left pleural effusion. Electronically Signed   By: Deatra Robinson M.D.   On: 02/25/2016 22:19   Ct Angio Chest Pe W And/or Wo Contrast  Result Date: 02/24/2016 CLINICAL DATA:  Hypotension with positive D-dimer. EXAM: CT ANGIOGRAPHY CHEST WITH CONTRAST TECHNIQUE: Multidetector CT imaging of the chest was performed using the standard protocol during bolus administration of intravenous contrast. Multiplanar CT image reconstructions and MIPs were obtained to evaluate the vascular anatomy. CONTRAST:  100 mL Isovue 370 COMPARISON:  None. FINDINGS: Cardiovascular: Large filling defect beginning in the distal right main pulmonary artery and extending into the lower lobe branches. No flow is demonstrated in right lower lobe branches. Appearance is consistent with pulmonary arterial thrombus or embolus. Cardiac enlargement. RV to LV ratio is less than 1, suggesting no evidence of right heart strain. Reflux of contrast material into the hepatic veins is consistent with right heart failure. No pericardial effusion. Calcification of the thoracic aorta and coronary arteries. Mediastinum/Nodes: Scattered lymph nodes are present throughout the mediastinum and in both hilar regions. Right paratracheal lymph  nodes measure about 1.8 cm short axis dimension. Appearance is nonspecific. These may be reactive nodes although pathologic lymphadenopathy is not excluded. Lungs/Pleura: Diffuse right-sided pleural thickening with pleural calcifications. Bilateral pleural effusions. Basilar atelectasis bilaterally. Emphysematous changes in the lungs. Right upper lung nodule with mild spiculation measures 7 mm diameter. There is effacement and narrowing of the right lower lobe bronchus with postobstructive consolidation. Suspicion of a a right infrahilar mass measuring 3.4 cm diameter. This could also be causing narrowing of the right lower lobe pulmonary artery, resulting in the thrombosis. Upper Abdomen: Upper abdominal ascites is present. Enlarged lateral segment of the left lobe of the liver suggests cirrhosis. Diffuse soft tissue edema. Musculoskeletal: No chest wall abnormality. No acute or significant osseous findings. Review of the MIP images confirms the above findings. IMPRESSION: Thrombosis in the distal right main pulmonary artery and extending into lower lobe branches. No right heart strain. I am suspicious that there is a mass in the right infrahilar region causing narrowing of the right lower lobe bronchus and  associated postobstructive change. This mass may also be compressing the right pulmonary artery. Indeterminate 7 mm nodule in the right upper lobe. Indeterminate borderline enlarged hilar and mediastinal lymph nodes. Bilateral pleural effusions with basilar atelectasis. Right-sided pleural thickening and pleural calcifications. Upper abdominal ascites with probable hepatic cirrhosis. These results were called by telephone at the time of interpretation on 02/24/2016 at 3:14 am to Dr. Ross Marcus , who verbally acknowledged these results. Electronically Signed   By: Burman Nieves M.D.   On: 02/24/2016 03:17   Dg Chest Port 1 View  Result Date: 02/25/2016 CLINICAL DATA:  Status post right-sided PICC line  placement. Assess positioning of the tip. EXAM: PORTABLE CHEST 1 VIEW COMPARISON:  Chest x-ray of February 24 2016. FINDINGS: The PICC line tip projects over the junction of the middle and distal thirds of the SVC. No postprocedure pneumothorax is observed. There are bilateral pleural effusions which are stable. The cardiac silhouette is enlarged. The pulmonary vascularity is engorged. The ICD is in stable position where visualized. IMPRESSION: The tip of the PICC line projects over the junction of the middle and distal thirds of the SVC. Electronically Signed   By: David  [place] M.D.   On: 02/25/2016 10:26   Dg Chest Portable 1 View  Result Date: 02/24/2016 CLINICAL DATA:  Chest pain and shortness of breath.  Home oxygen. EXAM: PORTABLE CHEST 1 VIEW COMPARISON:  01/15/2016 FINDINGS: Cardiac pacemaker. Cardiac enlargement without significant pulmonary vascular congestion. Perihilar interstitial changes suggesting edema. Bilateral pleural effusions with basilar atelectasis, greater on the right. Appearances are similar to previous study. No pneumothorax. Mediastinal contours appear intact. IMPRESSION: Cardiac enlargement with interstitial edema, bilateral pleural effusions, and basilar atelectasis, similar to the prior study. Electronically Signed   By: Burman Nieves M.D.   On: 02/24/2016 01:47    PHYSICAL EXAM General: NAD Neck: JVP 16 cm, no thyromegaly or thyroid nodule.  Lungs: Distant BS, end expiratory wheezes.  CV: Nondisplaced PMI.  Heart mildly tachy, irregular S1/S2, no S3/S4, no murmur.  2+ edema into thighs.  No carotid bruit.  Normal pedal pulses.  Abdomen: Soft, nontender, no hepatosplenomegaly, no distention.  Neurologic: Alert and oriented x 3.  Psych: Normal affect. Extremities: No clubbing or cyanosis.   TELEMETRY: Reviewed telemetry pt in atrial fibrillation 90s-100s  ASSESSMENT AND PLAN: Patient has long history of ischemic cardiomyopathy, EF 25-30% by echo 2/12 (consistent  with past).  He remains in atrial fibrillation with controlled rate.  He has a substantial PE.  He also has a lung mass concerning for cancer and started back smoking a few days ago.  He came to the hospital because of dyspnea with minimal exertion and swelling.  1. Acute on chronic systolic CHF: Echo this admission with EF 25-30%, mild LV dilation, moderate MR, PASP 39 mmHg, RV moderately dilated/moderately dysfunction.  Ischemic cardiomyopathy, has MDT ICD.  NYHA class IIIb symptoms, marked volume overload on exam.  Exacerbation likely triggered by ongoing atrial fibrillation and RV failure made worse by PE.  Milrinone started with low co-ox, now 74%.  He is diuresing well but still volume overloaded on exam with CVP 13-16. - Lasix 80 mg IV every 8 hrs, will give a dose of metolazone 2.5 x 1.   - Increase spironolactone to 25 mg daily.  - Toprol XL cut back to 12.5 daily with low output.  - Continue digoxin, level ok.  - Replace K.  2. PE: New diagnosis, noted in distal right main.  Trigger may  be right infrahilar mass/?lung cancer.   - Heparin gtt for now given possible need for procedures, pulmonary following.  Would favor DOAC eventually.  3. Atrial fibrillation: Persistent since 1/18 admission.  Rate is reasonable controlled on digoxin and Toprol XL (HR 90s-100s).  Was not able to get Xarelto from the [place], unfortunately it appears that there is an hour long class that has to be attended before Xarelto will be dispensed, and he had not had time to go to the class. In the interim, he had a PE.   - Now on amiodarone gtt for rate control while on milrinone.  - Cut back Toprol XL to 12.5 daily.   - Heparin gtt for now, would favor DOAC use eventually (hopefully can get this outside the [place] for him).  - Eventually would favor DCCV attempt. However, would not do DCCV at this time with fresh PE.  Would give some time for PE to stabilize/resolve (possible DCCV in a month).  4. Lung mass: Right infrahilar.   Possible lung cancer, pulmonary following, will need bronchoscopy/biopsy when stable.  He has recently resumed smoking, needs to quit.  5. Cirrhosis: CT concerning for possible hepatic cirrhosis.  6. CKD: Creatinine stable around 1.5. 7. Hyponatremia: Na higher today.  8. COPD exacerbation: Continue Solumedrol and nebs.  9. Pleural calcification.   Marca Ancona 02/27/2016 7:27 AM

## 2016-02-27 NOTE — Progress Notes (Signed)
Name: Max Rivas MRN: [id number] DOB: [birth year]    ADMISSION DATE:  02/24/2016 CONSULTATION DATE:  02/24/16  REFERRING MD :  Dr. Susie Cassette   CHIEF COMPLAINT:  Shortness of Breath    BRIEF SUMMARY:  66 y/o M, current smoker, who presented to [facility] on 2/12 with a 48 hour history of shortness of breath.    The patient was recently admitted 1/3-01/19/16 for a CHF decompensation and new dx of AF dischared on Xarelto (has not taken as he was to attend a class at the [place] before he could fill the Rx) and O2.  He reported on admit that he woke up with chest pain and shortness of breath.  He put his home O2 on at 2L without relief.  He initially reported his pain as 7/10.  He also noted he had been having abdominal pain and generalized swelling over the last two weeks without relief from lasix.  He endorses that he has been taking his blood pressure medication as prescribed.  The patient was admitted per [facility] for further assessment.  CXR demonstrated cardiomegaly with interstitial edema, bilateral pleural effusions and basilar atelectasis.  The patient had hypotension and an elevated d-Dimer at 3.99.  Follow up CTA of the chest was obtained which demonstrated thrombosis in the distal right main pulmonary artery extending into lower lobe branches, no right heart strain, concern for possible mass in the right infrahilar region causing narrowing of the right lower lobe bronchus with post-obstructive changes, right sided pleural thickening, upper abdominal ascites with probable hepatic cirrhosis.  Labs - Na 128, K 4.2, Cl 92, Sr Cr 1.47, AG 13, Albumin 3.1, troponin 0.03, BNP 1633, WBC 8.8, Hgb 15.9 and platelets 170.    He has a known medical hx of CHF, CAD s/p MIx5, Pacemaker, COPD and GERD.    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    SUBJECTIVE:  Patient reports feeling better daily, neg 2.2 liters again,  remains on amio, milrinone   VITAL SIGNS: Temp:  [97 F (36.1 C)-97.9 F (36.6 C)] 97.7 F (36.5 C) (02/15 0745) Pulse Rate:  [47-109] 88 (02/15 0745) Resp:  [17-25] 25 (02/15 0745) BP: (96-115)/(55-77) 115/68 (02/15 0745) SpO2:  [90 %-96 %] 93 % (02/15 0811) Weight:  [117.6 kg (259 lb 3.2 oz)] 117.6 kg (259 lb 3.2 oz) (02/15 0344)  PHYSICAL EXAMINATION: General:  Obese male in NAD HEENT: jvd down but remains PSY: normal mood / affect - good guy Neuro: AAOx4, MAE CV: s1s2 rrr, no m/r/g PULM:  Mild end exp posterior rt greater left base wheezing WU:JWJX, non-tender, bsx4 active  Extremities: warm/dry, 3+ pitting BLE edema  Skin: no rashes or lesions    Recent Labs Lab 02/25/16 0254 02/26/16 0500 02/27/16 0438  NA 127* 132* 134*  K 4.9 3.1* 3.4*  CL 94* 90* 88*  CO2 22 31 35*  BUN 35* 35* 36*  CREATININE 1.71* 1.48* 1.50*  GLUCOSE 136* 240* 175*     Recent Labs Lab 02/26/16 0500 02/26/16 0818 02/27/16 0438  HGB 14.3 14.4 13.3  HCT 43.3 43.9 40.9  WBC 5.6 5.7 10.5  PLT 143* 145* 148*    Ct Soft Tissue Neck W Contrast  Result Date: 02/25/2016 CLINICAL DATA:  Lymphadenopathy EXAM: CT NECK WITH CONTRAST TECHNIQUE: Multidetector CT imaging of the neck was performed using the standard protocol following the bolus administration of intravenous contrast. CONTRAST:  8mL ISOVUE-300 IOPAMIDOL (ISOVUE-300) INJECTION 61% COMPARISON:  None. FINDINGS: Pharynx and larynx: The nasopharynx is clear. The oropharynx and hypopharynx are normal. The epiglottis, supraglottic larynx, glottis and subglottic larynx are normal. No retropharyngeal collection. The parapharyngeal spaces are preserved. The visible oral cavity and base of tongue are normal. Salivary glands: The parotid and submandibular glands are normal. No sialolithiasis or salivary ductal dilatation. Thyroid: Normal Lymph nodes: There is no enlarged or abnormal density cervical lymph node. Vascular: There is aortic  atherosclerosis. There is bilateral atherosclerotic plaque at the carotid bifurcations, right worse than left. Limited intracranial: Normal. Visualized orbits: Normal. Mastoids and visualized paranasal sinuses: Clear Skeleton: There are no lytic or blastic osseous lesions. No bony spinal canal stenosis. Upper chest: There is a medium-sized left pleural effusion. There is a large, partially calcified pleural plaque along the lateral right pleural surface. There is biapical emphysema. No pulmonary nodules or masses. Other: There is a a right-sided PICC line with tip below the field of view. IMPRESSION: 1. No cervical lymphadenopathy or mass. 2. Partially calcified right lateral pleural plaque, possibly reflecting prior asbestos exposure. 3. Medium-sized left pleural effusion. Electronically Signed   By: Deatra Robinson M.D.   On: 02/25/2016 22:19      SIGNIFICANT EVENTS  2/12  Admit with SOB, hypotension/elevated D-dimer > CTA chest with concern for mass, R main pulm art thrombus  STUDIES:  2/12  CTA of the chest >> thrombosis in the distal right main pulmonary artery extending into lower lobe branches, no right heart strain, concern for possible mass in the right infrahilar region causing narrowing of the right lower lobe bronchus with post-obstructive changes, right sided pleural thickening, upper abdominal ascites with probable hepatic cirrhosis.  2/12  ECHO >> mildly dilated LV, systolic function severely reduced, LVED 25-30%, diffuse hypokinesis, akinesis of inferolateral myocardium & basalinferior myocardium, mild MR, RA mildly dilated, PA peak 39 mm Hg 2/14 CT Neck >> no cervical lymphadenopathy or mass, partially calcified right lateral pleural plaque, medium sized left pleural effusion   ASSESSMENT / PLAN:  66 year old male with PMH of COPD who quit smoking 8 years prior to presentation who comes to the ED with the chief complaint of SOB.  CTA was performed which revealed a thrombosis of the  distal right pulmonary artery and a lung mass.  The patient was started on heparin and PCCM was called to evaluate for thrombus and mass  Lung mass with Mediastinal Adenopathy: Right pulmonary artery thrombus: Acute on Chronic Hypoxic Respiratory Failure COPD with Acute Exacerbation  Pleural effusion and pleural calcification Hyponatremia   Plan: He is making daily progress, mostly from steroids and neg balance from lasix Maintain lasix per chf, some crt rise noted He remains rate controlled well Would change steroids to oral and reduce slowly over next 5-7 days Will need Bronch/ ebus but now delayed because of medical risk / resp failure Will re assess Monday, for possible tues bronch EBUS I updated pt in full  If he is able to dc home next week we can also arrange EBUS outpt  Mcarthur Rossetti. Tyson Alias, MD, FACP Pgr: [phone] [facility] 02/27/2016 10:48 AM

## 2016-02-27 NOTE — Progress Notes (Addendum)
Patient ID: Max Rivas, male   DOB: September 29, 1950, 66 y.o.   MRN: [id number]  PROGRESS NOTE    Max Rivas  WUJ:[id number] DOB: 02-12-1950 DOA: 02/24/2016  PCP: Jyl Heinz, MD   Brief Narrative:  66 year old male with past medical history of ischemic cardiomyopathy (EF 25-30% by ECHO in 2012), recent hospitalization for acute decompensated CHF and atrial fibrillation on xarelto, PE and lung mass concerning for cancer. He presented to ED with shortness of breath at rest with exertion as well as leg swelling for 48 hours prior to this admission. Pt also had chest pain and associated productive cough, fever and chills.  D dimer was positive and CT angio chest was subsequently done and showed right pulmonary artery thrombus and possible lung mass and bilateral pleural effusion. Xarelto on discharge in 01/2016 was prescribed but pt never took it as he had to attend class in [place] before obtaining the medication.   Assessment & Plan:  Acute respiratory failure with hypoxia secondary to acute on chronic systolic heart failure / Acute pulmonary embolism / Acute COPD exacerbation  - 2 D ECHO on this admission showed EF 25%, diffuse hypokinesis - Continue Lasix 80 mg IV Q 8 hours  - Continue solumedrol 40 mg Q 24 hours  - Continue brovana and Pulmicort BID nebulizer  - Appreciate pulmonary following   Right-sided pulmonary thrombus with right infrahilar mass, possible lung cancer  - Continue heparin drip  - Plan for EBUS on Friday 2/16  Atrial fibrillation with RVR  - CHADS vasc score 2 - Continue anticoagulation with heparin drip - Continue amiodarone drip and metoprolol  - Continue digoxin - Per cardio, eventually would favor DCCV attempt. However, would not do DCCV at this time with fresh PE. Would give time for PE to stabilize and then do DCCV in a month or so  Essential hypertension - Continue metoprolol 12.5 mg daily   Acute on chronic systolic CHF, NYHA class IIIb symptoms -  Continue lasix 80 mg Q 8 hours; still volume overloaded, CVP 13-16 - Cardio to give 1 dose metolazone 2.5 x 1 - Continue milrinone drip  - Continue spironolactone but increase to 25 mg daily - Toprol XL cut back to 12.5 mg daily with low output - Continue digoxin - Replace potassium   Hypokalemia / Metabolic alkalosis - Due to lasix - Replace   Hyponatremia - Likely in the setting of acute lung process, possible malignancy - Sodium 128 --> 132 --> 134  Dyslipidemia - Continue Lipitor and omega 3 supplementation   Chronic kidney disease stage 3 - Baseline Cr 1.55 about 1 month ago - Cr is within baseline range   Elevated TSH - TSH 8.8 on this admission - Needs follow up in outpt setting    DVT prophylaxis: Heparin drip Code Status: DNR/DNI Family Communication: no family at the bedside this am Disposition Plan: EBUS Friday   Consultants:   Cardio  PCCM  Procedures:   None  Antimicrobials:   None    Subjective: Has required high flow oxygen overnight but he reports his breathing is okay.  Objective: Vitals:   02/27/16 0344 02/27/16 0745 02/27/16 0811 02/27/16 1139  BP:  115/68  113/74  Pulse: (!) 101 88  94  Resp: 17 (!) 25  19  Temp: 97.9 F (36.6 C) 97.7 F (36.5 C)  97.5 F (36.4 C)  TempSrc: Oral Oral  Oral  SpO2: 90% 90% 93% (!) 87%  Weight: 117.6 kg (  259 lb 3.2 oz)     Height:        Intake/Output Summary (Last 24 hours) at 02/27/16 1240 Last data filed at 02/27/16 1200  Gross per 24 hour  Intake           2316.8 ml  Output             4415 ml  Net          -2098.2 ml   Filed Weights   02/26/16 0450 02/26/16 0735 02/27/16 0344  Weight: 118.5 kg (261 lb 3.2 oz) 118.8 kg (262 lb) 117.6 kg (259 lb 3.2 oz)    Examination:  General exam: No acute distress  Respiratory system: Bilateral wheezing, no rhonchi  Cardiovascular system: S1 & S2 heard, Rate controlled  Gastrointestinal system: (+) BS, non tender abd, not distended  Central  nervous system: Nonfocal  Extremities: Symmetric 5 x 5 power. No tenderness  Skin: +2 LE pitting edema, palpable pulses bilaterally  Psychiatry: Normal mood and behavior, no agitation, no restlessness   Data Reviewed: I have personally reviewed following labs and imaging studies  CBC:  Recent Labs Lab 02/24/16 0108 02/24/16 0621 02/25/16 0254 02/26/16 0500 02/26/16 0818 02/27/16 0438  WBC 9.9 8.8 8.8 5.6 5.7 10.5  NEUTROABS 6.4 5.4  --   --   --   --   HGB 16.1 15.9 16.3 14.3 14.4 13.3  HCT 48.2 48.0 49.7 43.3 43.9 40.9  MCV 94.1 94.9 95.4 94.7 95.4 94.5  PLT 175 170 160 143* 145* 148*   Basic Metabolic Panel:  Recent Labs Lab 02/24/16 0108 02/24/16 0621 02/25/16 0254 02/26/16 0500 02/27/16 0438  NA 127* 128* 127* 132* 134*  K 4.6 4.2 4.9 3.1* 3.4*  CL 94* 92* 94* 90* 88*  CO2 22 23 22 31  35*  GLUCOSE 120* 101* 136* 240* 175*  BUN 32* 32* 35* 35* 36*  CREATININE 1.37* 1.47* 1.71* 1.48* 1.50*  CALCIUM 8.9 8.8* 8.8* 8.7* 8.6*  MG  --  2.1  --   --   --    GFR: Estimated Creatinine Clearance: 65.7 mL/min (by C-G formula based on SCr of 1.5 mg/dL (H)). Liver Function Tests:  Recent Labs Lab 02/24/16 0621 02/25/16 0254  AST 30 31  ALT 35 35  ALKPHOS 92 96  BILITOT 2.3* 1.6*  PROT 6.1* 6.6  ALBUMIN 3.1* 3.1*   No results for input(s): LIPASE, AMYLASE in the last 168 hours. No results for input(s): AMMONIA in the last 168 hours. Coagulation Profile: No results for input(s): INR, PROTIME in the last 168 hours. Cardiac Enzymes:  Recent Labs Lab 02/24/16 0621 02/24/16 1217 02/24/16 1742  TROPONINI <0.03 <0.03 <0.03   BNP (last 3 results) No results for input(s): PROBNP in the last 8760 hours. HbA1C: No results for input(s): HGBA1C in the last 72 hours. CBG: No results for input(s): GLUCAP in the last 168 hours. Lipid Profile: No results for input(s): CHOL, HDL, LDLCALC, TRIG, CHOLHDL, LDLDIRECT in the last 72 hours. Thyroid Function Tests: No  results for input(s): TSH, T4TOTAL, FREET4, T3FREE, THYROIDAB in the last 72 hours. Anemia Panel: No results for input(s): VITAMINB12, FOLATE, FERRITIN, TIBC, IRON, RETICCTPCT in the last 72 hours. Urine analysis: No results found for: COLORURINE, APPEARANCEUR, LABSPEC, PHURINE, GLUCOSEU, HGBUR, BILIRUBINUR, KETONESUR, PROTEINUR, UROBILINOGEN, NITRITE, LEUKOCYTESUR Sepsis Labs: @LABRCNTIP (procalcitonin:4,lacticidven:4)   Recent Results (from the past 240 hour(s))  MRSA PCR Screening     Status: None   Collection Time: 02/25/16  6:20 PM  Result Value Ref Range Status   MRSA by PCR NEGATIVE NEGATIVE Final      Radiology Studies: Ct Angio Chest Pe W And/or Wo Contrast Result Date: 02/24/2016 Thrombosis in the distal right main pulmonary artery and extending into lower lobe branches. No right heart strain. I am suspicious that there is a mass in the right infrahilar region causing narrowing of the right lower lobe bronchus and associated postobstructive change. This mass may also be compressing the right pulmonary artery. Indeterminate 7 mm nodule in the right upper lobe. Indeterminate borderline enlarged hilar and mediastinal lymph nodes. Bilateral pleural effusions with basilar atelectasis. Right-sided pleural thickening and pleural calcifications. Upper abdominal ascites with probable hepatic cirrhosis. These results were called by telephone at the time of interpretation on 02/24/2016 at 3:14 am to Dr. Ross Marcus , who verbally acknowledged these results. Electronically Signed   By: Burman Nieves M.D.   On: 02/24/2016 03:17   Dg Chest Port 1 View Result Date: 02/25/2016 The tip of the PICC line projects over the junction of the middle and distal thirds of the SVC. Electronically Signed   By: David  [place] M.D.   On: 02/25/2016 10:26   Dg Chest Portable 1 View Cardiac enlargement with interstitial edema, bilateral pleural effusions, and basilar atelectasis, similar to the prior study.  Electronically Signed   By: Burman Nieves M.D.   On: 02/24/2016 01:47      Scheduled Meds: . arformoterol  15 mcg Nebulization BID  . aspirin EC  81 mg Oral Daily  . atorvastatin  20 mg Oral q1800  . budesonide  0.5 mg Inhalation BID  . digoxin  0.25 mg Oral Daily  . furosemide  80 mg Intravenous TID  . methylPREDNISol  40 mg Intravenous Q12H  . metoprolol succi  75 mg Oral BID  . multivitamin with m  1 tablet Oral Daily  . omega-3 acid ethyl   1 g Oral BID  . pantoprazole  40 mg Oral Daily  . spironolactone  12.5 mg Oral Daily   Continuous Infusions: . amiodarone 30 mg/hr (02/27/16 1221)  . heparin 1,550 Units/hr (02/27/16 0340)  . milrinone 0.25 mcg/kg/min (02/27/16 0110)     LOS: 3 days    Time spent: 25 minutes  Greater than 50% of the time spent on counseling and coordinating the care.   Manson Passey, MD [facility] Pager [phone]  If 7PM-7AM, please contact night-coverage [email or web address] Password [facility] 02/27/2016, 12:40 PM

## 2016-02-28 ENCOUNTER — Encounter (HOSPITAL_COMMUNITY)
Admission: EM | Disposition: A | Discharge status: Home Health | Payer: Self-pay | Source: Home / Self Care | Attending: Internal Medicine

## 2016-02-28 DIAGNOSIS — J441 Chronic obstructive pulmonary disease with (acute) exacerbation: Secondary | ICD-10-CM

## 2016-02-28 LAB — BASIC METABOLIC PANEL
ANION GAP: 11 (ref 5–15)
BUN: 30 mg/dL — AB (ref 6–20)
CO2: 39 mmol/L — ABNORMAL HIGH (ref 22–32)
Calcium: 9.1 mg/dL (ref 8.9–10.3)
Chloride: 84 mmol/L — ABNORMAL LOW (ref 101–111)
Creatinine, Ser: 1.39 mg/dL — ABNORMAL HIGH (ref 0.61–1.24)
GFR calc Af Amer: >60 mL/min (ref >60)
GFR, EST NON AFRICAN AMERICAN: 52 mL/min — AB (ref >60)
Glucose, Bld: 130 mg/dL — ABNORMAL HIGH (ref 65–99)
POTASSIUM: 3.6 mmol/L (ref 3.5–5.1)
SODIUM: 134 mmol/L — AB (ref 135–145)

## 2016-02-28 LAB — DIGOXIN LEVEL: Digoxin Level: 1 ng/mL (ref 0.8–2.0)

## 2016-02-28 LAB — CBC
HEMATOCRIT: 41.6 % (ref 39.0–52.0)
HEMOGLOBIN: 13.4 g/dL (ref 13.0–17.0)
MCH: 30.6 pg (ref 26.0–34.0)
MCHC: 32.2 g/dL (ref 30.0–36.0)
MCV: 95 fL (ref 78.0–100.0)
Platelets: 142 10*3/uL — ABNORMAL LOW (ref 150–400)
RBC: 4.38 MIL/uL (ref 4.22–5.81)
RDW: 15.7 % — AB (ref 11.5–15.5)
WBC: 10.2 10*3/uL (ref 4.0–10.5)

## 2016-02-28 LAB — COOXEMETRY PANEL
CARBOXYHEMOGLOBIN: 1.1 % (ref 0.5–1.5)
METHEMOGLOBIN: 0.9 % (ref 0.0–1.5)
O2 Saturation: 76.4 %
TOTAL HEMOGLOBIN: 13.9 g/dL (ref 12.0–16.0)

## 2016-02-28 LAB — APTT: APTT: 88 s — AB (ref 24–36)

## 2016-02-28 LAB — HEPARIN LEVEL (UNFRACTIONATED): Heparin Unfractionated: 0.48 IU/mL (ref 0.30–0.70)

## 2016-02-28 LAB — T4, FREE: FREE T4: 0.76 ng/dL (ref 0.61–1.12)

## 2016-02-28 SURGERY — BRONCHOSCOPY, WITH EBUS
Anesthesia: General

## 2016-02-28 MED ORDER — DIGOXIN 125 MCG PO TABS
0.1250 mg | ORAL_TABLET | Freq: Every day | ORAL | Status: DC
  Administered 2016-02-28 – 2016-03-05 (×7): 0.125 mg via ORAL
  Filled 2016-02-28 (×8): qty 1

## 2016-02-28 MED ORDER — METOLAZONE 5 MG PO TABS
2.5000 mg | ORAL_TABLET | Freq: Once | ORAL | Status: AC
  Administered 2016-02-28: 2.5 mg via ORAL
  Filled 2016-02-28: qty 1

## 2016-02-28 MED ORDER — LOSARTAN POTASSIUM 25 MG PO TABS
12.5000 mg | ORAL_TABLET | Freq: Every day | ORAL | Status: DC
  Administered 2016-02-28 – 2016-02-29 (×2): 12.5 mg via ORAL
  Filled 2016-02-28 (×2): qty 1

## 2016-02-28 NOTE — Progress Notes (Signed)
Patient ID: Max Rivas, male   DOB: 09-05-1950, 67 y.o.   MRN: [id number]    SUBJECTIVE: Co-ox low on 2/13, milrinone started.  Co-ox 76% today.  He is now on amiodarone for rate control.  I decreased Toprol XL to 12.5 daily.  He diuresed well again yesterday and weight is down 7 lbs. Breathing somewhat improved but CVP still high, 14-15.  Creatinine lower.   Scheduled Meds: . arformoterol  15 mcg Nebulization BID  . aspirin EC  81 mg Oral Daily  . atorvastatin  20 mg Oral q1800  . budesonide  0.5 mg Inhalation BID  . digoxin  0.25 mg Oral Daily  . furosemide  80 mg Intravenous TID  . metoprolol succinate  12.5 mg Oral Daily  . multivitamin with minerals  1 tablet Oral Daily  . omega-3 acid ethyl esters  1 g Oral BID  . pantoprazole  40 mg Oral Daily  . potassium chloride  60 mEq Oral BID  . predniSONE  20 mg Oral BID WC  . sodium chloride flush  10-40 mL Intracatheter Q12H  . spironolactone  25 mg Oral Daily   Continuous Infusions: . amiodarone 30 mg/hr (02/27/16 2342)  . heparin 1,550 Units/hr (02/27/16 2024)  . milrinone 0.25 mcg/kg/min (02/27/16 2313)   PRN Meds:.acetaminophen **OR** acetaminophen, levalbuterol, ondansetron **OR** ondansetron (ZOFRAN) IV, sodium chloride flush    Vitals:   02/27/16 2044 02/28/16 0000 02/28/16 0418 02/28/16 0432  BP: 101/62 (!) 106/59 108/66   Pulse: (!) 115 (!) 103  96  Resp:  (!) 22  18  Temp: 97.7 F (36.5 C) 97.6 F (36.4 C)  98.6 F (37 C)  TempSrc: Oral Oral  Oral  SpO2: 96% 96%  92%  Weight:    252 lb 4.8 oz (114.4 kg)  Height:        Intake/Output Summary (Last 24 hours) at 02/28/16 0731 Last data filed at 02/28/16 4098  Gross per 24 hour  Intake          1767.79 ml  Output             5290 ml  Net         -3522.21 ml    LABS: Basic Metabolic Panel:  Recent Labs  11/91/47 0438 02/28/16 0450  NA 134* 134*  K 3.4* 3.6  CL 88* 84*  CO2 35* 39*  GLUCOSE 175* 130*  BUN 36* 30*  CREATININE 1.50* 1.39*    CALCIUM 8.6* 9.1   Liver Function Tests: No results for input(s): AST, ALT, ALKPHOS, BILITOT, PROT, ALBUMIN in the last 72 hours. No results for input(s): LIPASE, AMYLASE in the last 72 hours. CBC:  Recent Labs  02/27/16 0438 02/28/16 0450  WBC 10.5 10.2  HGB 13.3 13.4  HCT 40.9 41.6  MCV 94.5 95.0  PLT 148* 142*   Cardiac Enzymes: No results for input(s): CKTOTAL, CKMB, CKMBINDEX, TROPONINI in the last 72 hours. BNP: Invalid input(s): POCBNP D-Dimer: No results for input(s): DDIMER in the last 72 hours. Hemoglobin A1C: No results for input(s): HGBA1C in the last 72 hours. Fasting Lipid Panel: No results for input(s): CHOL, HDL, LDLCALC, TRIG, CHOLHDL, LDLDIRECT in the last 72 hours. Thyroid Function Tests: No results for input(s): TSH, T4TOTAL, T3FREE, THYROIDAB in the last 72 hours.  Invalid input(s): FREET3 Anemia Panel: No results for input(s): VITAMINB12, FOLATE, FERRITIN, TIBC, IRON, RETICCTPCT in the last 72 hours.  RADIOLOGY: Ct Soft Tissue Neck W Contrast  Result Date: 02/25/2016 CLINICAL DATA:  Lymphadenopathy EXAM: CT NECK WITH CONTRAST TECHNIQUE: Multidetector CT imaging of the neck was performed using the standard protocol following the bolus administration of intravenous contrast. CONTRAST:  75mL ISOVUE-300 IOPAMIDOL (ISOVUE-300) INJECTION 61% COMPARISON:  None. FINDINGS: Pharynx and larynx: The nasopharynx is clear. The oropharynx and hypopharynx are normal. The epiglottis, supraglottic larynx, glottis and subglottic larynx are normal. No retropharyngeal collection. The parapharyngeal spaces are preserved. The visible oral cavity and base of tongue are normal. Salivary glands: The parotid and submandibular glands are normal. No sialolithiasis or salivary ductal dilatation. Thyroid: Normal Lymph nodes: There is no enlarged or abnormal density cervical lymph node. Vascular: There is aortic atherosclerosis. There is bilateral atherosclerotic plaque at the carotid  bifurcations, right worse than left. Limited intracranial: Normal. Visualized orbits: Normal. Mastoids and visualized paranasal sinuses: Clear Skeleton: There are no lytic or blastic osseous lesions. No bony spinal canal stenosis. Upper chest: There is a medium-sized left pleural effusion. There is a large, partially calcified pleural plaque along the lateral right pleural surface. There is biapical emphysema. No pulmonary nodules or masses. Other: There is a a right-sided PICC line with tip below the field of view. IMPRESSION: 1. No cervical lymphadenopathy or mass. 2. Partially calcified right lateral pleural plaque, possibly reflecting prior asbestos exposure. 3. Medium-sized left pleural effusion. Electronically Signed   By: Deatra Robinson M.D.   On: 02/25/2016 22:19   Ct Angio Chest Pe W And/or Wo Contrast  Result Date: 02/24/2016 CLINICAL DATA:  Hypotension with positive D-dimer. EXAM: CT ANGIOGRAPHY CHEST WITH CONTRAST TECHNIQUE: Multidetector CT imaging of the chest was performed using the standard protocol during bolus administration of intravenous contrast. Multiplanar CT image reconstructions and MIPs were obtained to evaluate the vascular anatomy. CONTRAST:  100 mL Isovue 370 COMPARISON:  None. FINDINGS: Cardiovascular: Large filling defect beginning in the distal right main pulmonary artery and extending into the lower lobe branches. No flow is demonstrated in right lower lobe branches. Appearance is consistent with pulmonary arterial thrombus or embolus. Cardiac enlargement. RV to LV ratio is less than 1, suggesting no evidence of right heart strain. Reflux of contrast material into the hepatic veins is consistent with right heart failure. No pericardial effusion. Calcification of the thoracic aorta and coronary arteries. Mediastinum/Nodes: Scattered lymph nodes are present throughout the mediastinum and in both hilar regions. Right paratracheal lymph nodes measure about 1.8 cm short axis dimension.  Appearance is nonspecific. These may be reactive nodes although pathologic lymphadenopathy is not excluded. Lungs/Pleura: Diffuse right-sided pleural thickening with pleural calcifications. Bilateral pleural effusions. Basilar atelectasis bilaterally. Emphysematous changes in the lungs. Right upper lung nodule with mild spiculation measures 7 mm diameter. There is effacement and narrowing of the right lower lobe bronchus with postobstructive consolidation. Suspicion of a a right infrahilar mass measuring 3.4 cm diameter. This could also be causing narrowing of the right lower lobe pulmonary artery, resulting in the thrombosis. Upper Abdomen: Upper abdominal ascites is present. Enlarged lateral segment of the left lobe of the liver suggests cirrhosis. Diffuse soft tissue edema. Musculoskeletal: No chest wall abnormality. No acute or significant osseous findings. Review of the MIP images confirms the above findings. IMPRESSION: Thrombosis in the distal right main pulmonary artery and extending into lower lobe branches. No right heart strain. I am suspicious that there is a mass in the right infrahilar region causing narrowing of the right lower lobe bronchus and associated postobstructive change. This mass may also be compressing the right pulmonary artery. Indeterminate 7 mm nodule in  the right upper lobe. Indeterminate borderline enlarged hilar and mediastinal lymph nodes. Bilateral pleural effusions with basilar atelectasis. Right-sided pleural thickening and pleural calcifications. Upper abdominal ascites with probable hepatic cirrhosis. These results were called by telephone at the time of interpretation on 02/24/2016 at 3:14 am to Dr. Ross Marcus , who verbally acknowledged these results. Electronically Signed   By: Burman Nieves M.D.   On: 02/24/2016 03:17   Dg Chest Port 1 View  Result Date: 02/25/2016 CLINICAL DATA:  Status post right-sided PICC line placement. Assess positioning of the tip. EXAM:  PORTABLE CHEST 1 VIEW COMPARISON:  Chest x-ray of February 24 2016. FINDINGS: The PICC line tip projects over the junction of the middle and distal thirds of the SVC. No postprocedure pneumothorax is observed. There are bilateral pleural effusions which are stable. The cardiac silhouette is enlarged. The pulmonary vascularity is engorged. The ICD is in stable position where visualized. IMPRESSION: The tip of the PICC line projects over the junction of the middle and distal thirds of the SVC. Electronically Signed   By: David  [place]iland M.D.   On: 02/25/2016 10:26   Dg Chest Portable 1 View  Result Date: 02/24/2016 CLINICAL DATA:  Chest pain and shortness of breath.  Home oxygen. EXAM: PORTABLE CHEST 1 VIEW COMPARISON:  01/15/2016 FINDINGS: Cardiac pacemaker. Cardiac enlargement without significant pulmonary vascular congestion. Perihilar interstitial changes suggesting edema. Bilateral pleural effusions with basilar atelectasis, greater on the right. Appearances are similar to previous study. No pneumothorax. Mediastinal contours appear intact. IMPRESSION: Cardiac enlargement with interstitial edema, bilateral pleural effusions, and basilar atelectasis, similar to the prior study. Electronically Signed   By: Burman Nieves M.D.   On: 02/24/2016 01:47    PHYSICAL EXAM General: NAD Neck: JVP 16 cm, no thyromegaly or thyroid nodule.  Lungs: Distant BS, end expiratory wheezes.  CV: Nondisplaced PMI.  Heart mildly tachy, irregular S1/S2, no S3/S4, no murmur.  2+ edema into thighs.  No carotid bruit.  Normal pedal pulses.  Abdomen: Soft, nontender, no hepatosplenomegaly, no distention.  Neurologic: Alert and oriented x 3.  Psych: Normal affect. Extremities: No clubbing or cyanosis.   TELEMETRY: Reviewed telemetry pt in atrial fibrillation 90s-100s  ASSESSMENT AND PLAN: Patient has long history of ischemic cardiomyopathy, EF 25-30% by echo 2/12 (consistent with past).  He remains in atrial fibrillation  with controlled rate.  He has a substantial PE.  He also has a lung mass concerning for cancer and started back smoking a few days ago.  He came to the hospital because of dyspnea with minimal exertion and swelling.  1. Acute on chronic systolic CHF: Echo this admission with EF 25-30%, mild LV dilation, moderate MR, PASP 39 mmHg, RV moderately dilated/moderately dysfunction.  Ischemic cardiomyopathy, has MDT ICD.  NYHA class IIIb symptoms, marked volume overload on exam.  Exacerbation likely triggered by ongoing atrial fibrillation and RV failure made worse by PE.  Milrinone started with low co-ox, now 76%.  He is diuresing well but still quite volume overloaded on exam with CVP 14-15. - Lasix 80 mg IV every 8 hrs, will give a dose of metolazone 2.5 x 1 again.   - Continue spironolactone 25 mg daily.  - Add losartan 12.5 daily.  - Toprol XL cut back to 12.5 daily with low output.  - Decrease digoxin to 0.125 daily.   2. PE: New diagnosis, noted in distal right main.  Trigger may be right infrahilar mass/?lung cancer.   - Heparin gtt for now given  possible need for procedures, pulmonary following.  Would favor DOAC eventually.  3. Atrial fibrillation: Persistent since 1/18 admission.  Rate is reasonable controlled on digoxin and Toprol XL (HR 90s-100s). Was not able to get Xarelto from the [place], unfortunately it appears that there is an hour long class that has to be attended before Xarelto will be dispensed, and he had not had time to go to the class. In the interim, he had a PE.   - Now on amiodarone gtt for rate control while on milrinone.  - Cut back Toprol XL to 12.5 daily.   - Heparin gtt for now, would favor DOAC use eventually (hopefully can get this outside the [place] for him).  - Eventually would favor DCCV attempt. However, would not do DCCV at this time with fresh PE.  Would give some time for PE to stabilize/resolve (possible DCCV in a month).  4. Lung mass: Right infrahilar.  Possible lung  cancer, pulmonary following, will need bronchoscopy/EBUS when stable => possibly Tuesday.  He has recently resumed smoking, needs to quit.  5. Cirrhosis: CT concerning for possible hepatic cirrhosis.  6. CKD: Creatinine lower today at 1.39. 7. Hyponatremia: Mild, stable.   8. COPD exacerbation: Continue transitioned to prednisone.  9. Pleural calcification.   Marca Ancona 02/28/2016 7:31 AM

## 2016-02-28 NOTE — Progress Notes (Signed)
ANTICOAGULATION CONSULT NOTE  Pharmacy Consult for Heparin Indication: pulmonary embolus  Allergies  Allergen Reactions  . Crestor [Rosuvastatin Calcium] Other (See Comments)    Back spasms    Patient Measurements: Height: 5\' 11"  (180.3 cm) Weight: 252 lb 4.8 oz (114.4 kg) IBW/kg (Calculated) : 75.3  Ht: 71 in Wt: 109 kg  IBW: 75 kg Heparin Dosing Weight: 99 klg  Vital Signs: Temp: 98.5 F (36.9 C) (02/16 0739) Temp Source: Oral (02/16 0739) BP: 108/65 (02/16 0739) Pulse Rate: 89 (02/16 0739)  Labs:  Recent Labs  02/26/16 0500 02/26/16 0745 02/26/16 0818 02/27/16 0437 02/27/16 0438 02/28/16 0450  HGB 14.3  --  14.4  --  13.3 13.4  HCT 43.3  --  43.9  --  40.9 41.6  PLT 143*  --  145*  --  148* 142*  APTT  --   --   --   --   --  88*  HEPARINUNFRC  --  0.58  --  0.65  --  0.48  CREATININE 1.48*  --   --   --  1.50* 1.39*    Estimated Creatinine Clearance: 69.9 mL/min (by C-G formula based on SCr of 1.39 mg/dL (H)).   Assessment: 70 YOM with history of Afib prescribed Xarelto PTA but he couldn't start until he finishes an education class at the [facility]  Patient presented with a PE and started in IV heparin.  Heparin drip rate 1550 uts/hr, heparin level 0.5 is therapeutic and cbc has remained stable.   Goal of Therapy:  Heparin level 0.3-0.7 units/ml Monitor platelets by anticoagulation protocol: Yes    Plan:  - Continue heparin gtt at 1550 units/hr - Daily heparin level and CBC - F/U with transitioning patient to PO anticoagulation  Leota Sauers Pharm.D. CPP, BCPS Clinical Pharmacist [phone] 02/28/2016 10:12 AM

## 2016-02-28 NOTE — Progress Notes (Addendum)
Patient ID: Max Rivas, male   DOB: [birth year], 66 y.o.   MRN: [id number]  PROGRESS NOTE    Max Rivas  MHD:[id number] DOB: September 07, 1950 DOA: 02/24/2016  PCP: Jyl Heinz, MD   Brief Narrative:  66 year old male with past medical history of ischemic cardiomyopathy (EF 25-30% by ECHO in 2012), recent hospitalization for acute decompensated CHF and atrial fibrillation on xarelto, PE and lung mass concerning for cancer. He presented to ED with shortness of breath at rest with exertion as well as leg swelling for 48 hours prior to this admission. Pt also had chest pain and associated productive cough, fever and chills.  D dimer was positive and CT angio chest was subsequently done and showed right pulmonary artery thrombus and possible lung mass and bilateral pleural effusion. Xarelto on discharge in 01/2016 was prescribed but pt never took it as he had to attend class in [place] before obtaining the medication.   Assessment & Plan:  Acute respiratory failure with hypoxia / Acute pulmonary embolism / Acute COPD exacerbation  - 2 D ECHO on this admission showed EF 25%, diffuse hypokinesis - Continue Lasix 80 mg IV Q 8 hours  - Stopped solumedrol today  - Continue brovana and Pulmicort BID nebulizer  - Continue oxygen support via [place] to kep O2 sat above 90% - Pt reports he feels his breathing is better this am  Right-sided pulmonary thrombus with right infrahilar mass, possible lung cancer  - Continue heparin drip  - Plan for EBUS on Tuesday next week if his respiratory status remains stable. EBUS deferred as pt high risk for resp failure   Atrial fibrillation with RVR  - CHADS vasc score 2 - Continue anticoagulation with heparin drip - Continue amiodarone drip and metoprolol  - Continue digoxin - Per cardio, eventually would favor DCCV attempt. However, would not do DCCV at this time with fresh PE. Would give time for PE to stabilize and then do DCCV in a month or so  Essential  hypertension - Continue metoprolol 12.5 mg daily  - Continue losartan 12.5 mg daily   Acute on chronic systolic CHF, NYHA class IIIb symptoms - Continue lasix 80 mg Q 8 hours; still volume overloaded, CVP 13-16 - Cardio gave 1 dose metolazone 2.5 x 1 on 2/15 - Continue milrinone drip  - Continue spironolactone 25 mg daily - Continue Toprol XL 12.5 mg daily  - Continue digoxin - Replace potassium   Hypokalemia / Metabolic alkalosis - Due to lasix - Replaced  Hyponatremia - Likely in the setting of acute lung process, possible malignancy - Sodium 128 --> 132 --> 134  Dyslipidemia - Continue Lipitor and omega 3 supplementation   Chronic kidney disease stage 3 - Baseline Cr 1.55 about 1 month ago - Cr is within baseline range   Elevated TSH - TSH 8.8 on this admission - Needs follow up in outpt setting    DVT prophylaxis: Heparin drip Code Status: DNR/DNI Family Communication: no family at the bedside this am Disposition Plan: EBUS on Tuesday   Consultants:   Cardio  PCCM  Procedures:   None  Antimicrobials:   None    Subjective: Says his breathing is better this am.   Objective: Vitals:   02/28/16 0432 02/28/16 0739 02/28/16 0822 02/28/16 0828  BP:  108/65    Pulse: 96 89    Resp: 18 18    Temp: 98.6 F (37 C) 98.5 F (36.9 C)    TempSrc: Oral  Oral    SpO2: 92% 94% 97% 96%  Weight: 114.4 kg (252 lb 4.8 oz)     Height:        Intake/Output Summary (Last 24 hours) at 02/28/16 1050 Last data filed at 02/28/16 1026  Gross per 24 hour  Intake          1402.99 ml  Output             4850 ml  Net         -3447.01 ml   Filed Weights   02/26/16 0735 02/27/16 0344 02/28/16 0432  Weight: 118.8 kg (262 lb) 117.6 kg (259 lb 3.2 oz) 114.4 kg (252 lb 4.8 oz)    Examination:  General exam: No acute distress, calm  Respiratory system: Bilateral wheezing, no rhonchi  Cardiovascular system: S1 & S2 heard, Rate controlled  Gastrointestinal system: (+)  BS, non tender abd, not distended  Central nervous system: No focal deficits  Extremities: Symmetric 5 x 5 power. No tenderness  Skin: +2 LE pitting edema, palpable pulses  Psychiatry: Normal mood and behavior  Data Reviewed: I have personally reviewed following labs and imaging studies  CBC:  Recent Labs Lab 02/24/16 0108 02/24/16 0621 02/25/16 0254 02/26/16 0500 02/26/16 0818 02/27/16 0438 02/28/16 0450  WBC 9.9 8.8 8.8 5.6 5.7 10.5 10.2  NEUTROABS 6.4 5.4  --   --   --   --   --   HGB 16.1 15.9 16.3 14.3 14.4 13.3 13.4  HCT 48.2 48.0 49.7 43.3 43.9 40.9 41.6  MCV 94.1 94.9 95.4 94.7 95.4 94.5 95.0  PLT 175 170 160 143* 145* 148* 142*   Basic Metabolic Panel:  Recent Labs Lab 02/24/16 0621 02/25/16 0254 02/26/16 0500 02/27/16 0438 02/28/16 0450  NA 128* 127* 132* 134* 134*  K 4.2 4.9 3.1* 3.4* 3.6  CL 92* 94* 90* 88* 84*  CO2 23 22 31  35* 39*  GLUCOSE 101* 136* 240* 175* 130*  BUN 32* 35* 35* 36* 30*  CREATININE 1.47* 1.71* 1.48* 1.50* 1.39*  CALCIUM 8.8* 8.8* 8.7* 8.6* 9.1  MG 2.1  --   --   --   --    GFR: Estimated Creatinine Clearance: 69.9 mL/min (by C-G formula based on SCr of 1.39 mg/dL (H)). Liver Function Tests:  Recent Labs Lab 02/24/16 0621 02/25/16 0254  AST 30 31  ALT 35 35  ALKPHOS 92 96  BILITOT 2.3* 1.6*  PROT 6.1* 6.6  ALBUMIN 3.1* 3.1*   No results for input(s): LIPASE, AMYLASE in the last 168 hours. No results for input(s): AMMONIA in the last 168 hours. Coagulation Profile: No results for input(s): INR, PROTIME in the last 168 hours. Cardiac Enzymes:  Recent Labs Lab 02/24/16 0621 02/24/16 1217 02/24/16 1742  TROPONINI <0.03 <0.03 <0.03   BNP (last 3 results) No results for input(s): PROBNP in the last 8760 hours. HbA1C: No results for input(s): HGBA1C in the last 72 hours. CBG: No results for input(s): GLUCAP in the last 168 hours. Lipid Profile: No results for input(s): CHOL, HDL, LDLCALC, TRIG, CHOLHDL, LDLDIRECT  in the last 72 hours. Thyroid Function Tests:  Recent Labs  02/28/16 0450  FREET4 0.76   Anemia Panel: No results for input(s): VITAMINB12, FOLATE, FERRITIN, TIBC, IRON, RETICCTPCT in the last 72 hours. Urine analysis: No results found for: COLORURINE, APPEARANCEUR, LABSPEC, PHURINE, GLUCOSEU, HGBUR, BILIRUBINUR, KETONESUR, PROTEINUR, UROBILINOGEN, NITRITE, LEUKOCYTESUR Sepsis Labs: @LABRCNTIP (procalcitonin:4,lacticidven:4)   Recent Results (from the past 240 hour(s))  MRSA PCR Screening  Status: None   Collection Time: 02/25/16  6:20 PM  Result Value Ref Range Status   MRSA by PCR NEGATIVE NEGATIVE Final      Radiology Studies: Ct Angio Chest Pe W And/or Wo Contrast Result Date: 02/24/2016 Thrombosis in the distal right main pulmonary artery and extending into lower lobe branches. No right heart strain. I am suspicious that there is a mass in the right infrahilar region causing narrowing of the right lower lobe bronchus and associated postobstructive change. This mass may also be compressing the right pulmonary artery. Indeterminate 7 mm nodule in the right upper lobe. Indeterminate borderline enlarged hilar and mediastinal lymph nodes. Bilateral pleural effusions with basilar atelectasis. Right-sided pleural thickening and pleural calcifications. Upper abdominal ascites with probable hepatic cirrhosis. These results were called by telephone at the time of interpretation on 02/24/2016 at 3:14 am to Dr. Ross Marcus , who verbally acknowledged these results. Electronically Signed   By: Burman Nieves M.D.   On: 02/24/2016 03:17   Dg Chest Port 1 View Result Date: 02/25/2016 The tip of the PICC line projects over the junction of the middle and distal thirds of the SVC. Electronically Signed   By: David  [place] M.D.   On: 02/25/2016 10:26   Dg Chest Portable 1 View Cardiac enlargement with interstitial edema, bilateral pleural effusions, and basilar atelectasis, similar to the  prior study. Electronically Signed   By: Burman Nieves M.D.   On: 02/24/2016 01:47      Scheduled Meds: . arformoterol  15 mcg Nebulization BID  . aspirin EC  81 mg Oral Daily  . atorvastatin  20 mg Oral q1800  . budesonide  0.5 mg Inhalation BID  . digoxin  0.25 mg Oral Daily  . furosemide  80 mg Intravenous TID  . methylPREDNISol  40 mg Intravenous Q12H  . metoprolol succi  75 mg Oral BID  . multivitamin with m  1 tablet Oral Daily  . omega-3 acid ethyl   1 g Oral BID  . pantoprazole  40 mg Oral Daily  . spironolactone  12.5 mg Oral Daily   Continuous Infusions: . amiodarone 30 mg/hr (02/27/16 2342)  . heparin 1,550 Units/hr (02/27/16 2024)  . milrinone 0.25 mcg/kg/min (02/28/16 1026)     LOS: 4 days    Time spent: 15 minutes  Greater than 50% of the time spent on counseling and coordinating the care.   Manson Passey, MD [facility] Pager [phone]  If 7PM-7AM, please contact night-coverage [email or web address] Password [email or web address] 02/28/2016, 10:50 AM

## 2016-02-29 LAB — BASIC METABOLIC PANEL
Anion gap: 11 (ref 5–15)
BUN: 30 mg/dL — AB (ref 6–20)
CO2: 41 mmol/L — ABNORMAL HIGH (ref 22–32)
CREATININE: 1.2 mg/dL (ref 0.61–1.24)
Calcium: 8.8 mg/dL — ABNORMAL LOW (ref 8.9–10.3)
Chloride: 79 mmol/L — ABNORMAL LOW (ref 101–111)
GFR calc Af Amer: >60 mL/min (ref >60)
Glucose, Bld: 203 mg/dL — ABNORMAL HIGH (ref 65–99)
Potassium: 4.4 mmol/L (ref 3.5–5.1)
SODIUM: 131 mmol/L — AB (ref 135–145)

## 2016-02-29 LAB — CBC
HCT: 42 % (ref 39.0–52.0)
Hemoglobin: 13.6 g/dL (ref 13.0–17.0)
MCH: 31.1 pg (ref 26.0–34.0)
MCHC: 32.4 g/dL (ref 30.0–36.0)
MCV: 96.1 fL (ref 78.0–100.0)
PLATELETS: 135 10*3/uL — AB (ref 150–400)
RBC: 4.37 MIL/uL (ref 4.22–5.81)
RDW: 15.8 % — ABNORMAL HIGH (ref 11.5–15.5)
WBC: 10.2 10*3/uL (ref 4.0–10.5)

## 2016-02-29 LAB — T3, FREE: T3, Free: 1.9 pg/mL — ABNORMAL LOW (ref 2.0–4.4)

## 2016-02-29 LAB — COOXEMETRY PANEL
Carboxyhemoglobin: 1.1 % (ref 0.5–1.5)
Methemoglobin: 0.8 % (ref 0.0–1.5)
O2 Saturation: 71.4 %
TOTAL HEMOGLOBIN: 13.9 g/dL (ref 12.0–16.0)

## 2016-02-29 LAB — HEPARIN LEVEL (UNFRACTIONATED): Heparin Unfractionated: 0.46 IU/mL (ref 0.30–0.70)

## 2016-02-29 MED ORDER — LOSARTAN POTASSIUM 25 MG PO TABS
12.5000 mg | ORAL_TABLET | Freq: Two times a day (BID) | ORAL | Status: DC
  Administered 2016-02-29 – 2016-03-01 (×3): 12.5 mg via ORAL
  Filled 2016-02-29 (×3): qty 1

## 2016-02-29 MED ORDER — METOLAZONE 5 MG PO TABS
2.5000 mg | ORAL_TABLET | Freq: Once | ORAL | Status: AC
  Administered 2016-02-29: 2.5 mg via ORAL
  Filled 2016-02-29: qty 1

## 2016-02-29 MED ORDER — GI COCKTAIL ~~LOC~~: 30.0000 mL | ORAL | Status: DC | PRN

## 2016-02-29 NOTE — Progress Notes (Signed)
Patient ID: Max Rivas, male   DOB: [birth year], 66 y.o.   MRN: [id number]    SUBJECTIVE: Co-ox low on 2/13, milrinone started.  Co-ox 71% today.  He is now on amiodarone for rate control.  I decreased Toprol XL to 12.5 daily.  He diuresed well again yesterday and weight is down 4 lbs. Breathing somewhat improved but CVP still high, 14-15.  Creatinine lower.   Scheduled Meds: . arformoterol  15 mcg Nebulization BID  . aspirin EC  81 mg Oral Daily  . atorvastatin  20 mg Oral q1800  . budesonide  0.5 mg Inhalation BID  . digoxin  0.125 mg Oral Daily  . furosemide  80 mg Intravenous TID  . losartan  12.5 mg Oral BID  . metolazone  2.5 mg Oral Once  . metoprolol succinate  12.5 mg Oral Daily  . multivitamin with minerals  1 tablet Oral Daily  . omega-3 acid ethyl esters  1 g Oral BID  . pantoprazole  40 mg Oral Daily  . potassium chloride  60 mEq Oral BID  . predniSONE  20 mg Oral BID WC  . sodium chloride flush  10-40 mL Intracatheter Q12H  . spironolactone  25 mg Oral Daily   Continuous Infusions: . amiodarone 30 mg/hr (02/29/16 0209)  . heparin 1,550 Units/hr (02/29/16 0904)  . milrinone 0.25 mcg/kg/min (02/29/16 0905)   PRN Meds:.acetaminophen **OR** acetaminophen, levalbuterol, ondansetron **OR** ondansetron (ZOFRAN) IV, sodium chloride flush    Vitals:   02/29/16 0020 02/29/16 0430 02/29/16 1013 02/29/16 1014  BP: (!) 98/54 109/68  118/71  Pulse: (!) 140 69 95 (!) 105  Resp: 16 (!) 21    Temp:  98.7 F (37.1 C)    TempSrc:  Oral    SpO2: 97% 100%    Weight:  248 lb 3.2 oz (112.6 kg)    Height:        Intake/Output Summary (Last 24 hours) at 02/29/16 1225 Last data filed at 02/29/16 0400  Gross per 24 hour  Intake           676.89 ml  Output             2920 ml  Net         -2243.11 ml    LABS: Basic Metabolic Panel:  Recent Labs  40/98/11 0450 02/29/16 0453  NA 134* 131*  K 3.6 4.4  CL 84* 79*  CO2 39* 41*  GLUCOSE 130* 203*  BUN 30* 30*    CREATININE 1.39* 1.20  CALCIUM 9.1 8.8*   Liver Function Tests: No results for input(s): AST, ALT, ALKPHOS, BILITOT, PROT, ALBUMIN in the last 72 hours. No results for input(s): LIPASE, AMYLASE in the last 72 hours. CBC:  Recent Labs  02/28/16 0450 02/29/16 0453  WBC 10.2 10.2  HGB 13.4 13.6  HCT 41.6 42.0  MCV 95.0 96.1  PLT 142* 135*   Cardiac Enzymes: No results for input(s): CKTOTAL, CKMB, CKMBINDEX, TROPONINI in the last 72 hours. BNP: Invalid input(s): POCBNP D-Dimer: No results for input(s): DDIMER in the last 72 hours. Hemoglobin A1C: No results for input(s): HGBA1C in the last 72 hours. Fasting Lipid Panel: No results for input(s): CHOL, HDL, LDLCALC, TRIG, CHOLHDL, LDLDIRECT in the last 72 hours. Thyroid Function Tests:  Recent Labs  02/28/16 0450  T3FREE 1.9*   Anemia Panel: No results for input(s): VITAMINB12, FOLATE, FERRITIN, TIBC, IRON, RETICCTPCT in the last 72 hours.  RADIOLOGY: Ct Soft Tissue Neck W Contrast  Result Date: 02/25/2016 CLINICAL DATA:  Lymphadenopathy EXAM: CT NECK WITH CONTRAST TECHNIQUE: Multidetector CT imaging of the neck was performed using the standard protocol following the bolus administration of intravenous contrast. CONTRAST:  60mL ISOVUE-300 IOPAMIDOL (ISOVUE-300) INJECTION 61% COMPARISON:  None. FINDINGS: Pharynx and larynx: The nasopharynx is clear. The oropharynx and hypopharynx are normal. The epiglottis, supraglottic larynx, glottis and subglottic larynx are normal. No retropharyngeal collection. The parapharyngeal spaces are preserved. The visible oral cavity and base of tongue are normal. Salivary glands: The parotid and submandibular glands are normal. No sialolithiasis or salivary ductal dilatation. Thyroid: Normal Lymph nodes: There is no enlarged or abnormal density cervical lymph node. Vascular: There is aortic atherosclerosis. There is bilateral atherosclerotic plaque at the carotid bifurcations, right worse than left.  Limited intracranial: Normal. Visualized orbits: Normal. Mastoids and visualized paranasal sinuses: Clear Skeleton: There are no lytic or blastic osseous lesions. No bony spinal canal stenosis. Upper chest: There is a medium-sized left pleural effusion. There is a large, partially calcified pleural plaque along the lateral right pleural surface. There is biapical emphysema. No pulmonary nodules or masses. Other: There is a a right-sided PICC line with tip below the field of view. IMPRESSION: 1. No cervical lymphadenopathy or mass. 2. Partially calcified right lateral pleural plaque, possibly reflecting prior asbestos exposure. 3. Medium-sized left pleural effusion. Electronically Signed   By: Deatra Robinson M.D.   On: 02/25/2016 22:19   Ct Angio Chest Pe W And/or Wo Contrast  Result Date: 02/24/2016 CLINICAL DATA:  Hypotension with positive D-dimer. EXAM: CT ANGIOGRAPHY CHEST WITH CONTRAST TECHNIQUE: Multidetector CT imaging of the chest was performed using the standard protocol during bolus administration of intravenous contrast. Multiplanar CT image reconstructions and MIPs were obtained to evaluate the vascular anatomy. CONTRAST:  100 mL Isovue 370 COMPARISON:  None. FINDINGS: Cardiovascular: Large filling defect beginning in the distal right main pulmonary artery and extending into the lower lobe branches. No flow is demonstrated in right lower lobe branches. Appearance is consistent with pulmonary arterial thrombus or embolus. Cardiac enlargement. RV to LV ratio is less than 1, suggesting no evidence of right heart strain. Reflux of contrast material into the hepatic veins is consistent with right heart failure. No pericardial effusion. Calcification of the thoracic aorta and coronary arteries. Mediastinum/Nodes: Scattered lymph nodes are present throughout the mediastinum and in both hilar regions. Right paratracheal lymph nodes measure about 1.8 cm short axis dimension. Appearance is nonspecific. These may  be reactive nodes although pathologic lymphadenopathy is not excluded. Lungs/Pleura: Diffuse right-sided pleural thickening with pleural calcifications. Bilateral pleural effusions. Basilar atelectasis bilaterally. Emphysematous changes in the lungs. Right upper lung nodule with mild spiculation measures 7 mm diameter. There is effacement and narrowing of the right lower lobe bronchus with postobstructive consolidation. Suspicion of a a right infrahilar mass measuring 3.4 cm diameter. This could also be causing narrowing of the right lower lobe pulmonary artery, resulting in the thrombosis. Upper Abdomen: Upper abdominal ascites is present. Enlarged lateral segment of the left lobe of the liver suggests cirrhosis. Diffuse soft tissue edema. Musculoskeletal: No chest wall abnormality. No acute or significant osseous findings. Review of the MIP images confirms the above findings. IMPRESSION: Thrombosis in the distal right main pulmonary artery and extending into lower lobe branches. No right heart strain. I am suspicious that there is a mass in the right infrahilar region causing narrowing of the right lower lobe bronchus and associated postobstructive change. This mass may also be compressing the right pulmonary  artery. Indeterminate 7 mm nodule in the right upper lobe. Indeterminate borderline enlarged hilar and mediastinal lymph nodes. Bilateral pleural effusions with basilar atelectasis. Right-sided pleural thickening and pleural calcifications. Upper abdominal ascites with probable hepatic cirrhosis. These results were called by telephone at the time of interpretation on 02/24/2016 at 3:14 am to Dr. Ross Marcus , who verbally acknowledged these results. Electronically Signed   By: Burman Nieves M.D.   On: 02/24/2016 03:17   Dg Chest Port 1 View  Result Date: 02/25/2016 CLINICAL DATA:  Status post right-sided PICC line placement. Assess positioning of the tip. EXAM: PORTABLE CHEST 1 VIEW COMPARISON:   Chest x-ray of February 24 2016. FINDINGS: The PICC line tip projects over the junction of the middle and distal thirds of the SVC. No postprocedure pneumothorax is observed. There are bilateral pleural effusions which are stable. The cardiac silhouette is enlarged. The pulmonary vascularity is engorged. The ICD is in stable position where visualized. IMPRESSION: The tip of the PICC line projects over the junction of the middle and distal thirds of the SVC. Electronically Signed   By: David  Swaziland M.D.   On: 02/25/2016 10:26   Dg Chest Portable 1 View  Result Date: 02/24/2016 CLINICAL DATA:  Chest pain and shortness of breath.  Home oxygen. EXAM: PORTABLE CHEST 1 VIEW COMPARISON:  01/15/2016 FINDINGS: Cardiac pacemaker. Cardiac enlargement without significant pulmonary vascular congestion. Perihilar interstitial changes suggesting edema. Bilateral pleural effusions with basilar atelectasis, greater on the right. Appearances are similar to previous study. No pneumothorax. Mediastinal contours appear intact. IMPRESSION: Cardiac enlargement with interstitial edema, bilateral pleural effusions, and basilar atelectasis, similar to the prior study. Electronically Signed   By: Burman Nieves M.D.   On: 02/24/2016 01:47    PHYSICAL EXAM General: NAD Neck: JVP 16 cm, no thyromegaly or thyroid nodule.  Lungs: Distant BS, end expiratory wheezes.  CV: Nondisplaced PMI.  Heart mildly tachy, irregular S1/S2, no S3/S4, no murmur.  2+ edema into thighs.  No carotid bruit.  Normal pedal pulses.  Abdomen: Soft, nontender, no hepatosplenomegaly, no distention.  Neurologic: Alert and oriented x 3.  Psych: Normal affect. Extremities: No clubbing or cyanosis.   TELEMETRY: Reviewed telemetry pt in atrial fibrillation 90s-100s  ASSESSMENT AND PLAN: Patient has long history of ischemic cardiomyopathy, EF 25-30% by echo 2/12 (consistent with past).  He remains in atrial fibrillation with controlled rate.  He has a  substantial PE.  He also has a lung mass concerning for cancer and started back smoking a few days ago.  He came to the hospital because of dyspnea with minimal exertion and swelling.  1. Acute on chronic systolic CHF: Echo this admission with EF 25-30%, mild LV dilation, moderate MR, PASP 39 mmHg, RV moderately dilated/moderately dysfunction.  Ischemic cardiomyopathy, has MDT ICD.  NYHA class IIIb symptoms, marked volume overload on exam.  Exacerbation likely triggered by ongoing atrial fibrillation and RV failure made worse by PE.  Milrinone started with low co-ox, now 71%.  He is diuresing well with weight coming down but still quite volume overloaded on exam with CVP 14-15. - Lasix 80 mg IV every 8 hrs, will give a dose of metolazone 2.5 x 1 again.   - Continue spironolactone 25 mg daily.  - Increase losartan to 12.5 bid.  - Toprol XL cut back to 12.5 daily with low output.  - Decrease digoxin to 0.125 daily. Digoxin level 1.0 today, repeat in a few more days now that he's on lower dose.  2. PE: New diagnosis, noted in distal right main.  Trigger may be right infrahilar mass/?lung cancer.   - Heparin gtt for now given possible need for procedures, pulmonary following.  Would favor DOAC eventually.  3. Atrial fibrillation: Persistent since 1/18 admission.  Rate is reasonable controlled on digoxin and Toprol XL (HR 90s-100s). Was not able to get Xarelto from the [place], unfortunately it appears that there is an hour long class that has to be attended before Xarelto will be dispensed, and he had not had time to go to the class. In the interim, he had a PE.   - Now on amiodarone gtt for rate control while on milrinone.  - Cut back Toprol XL to 12.5 daily.   - Heparin gtt for now, would favor DOAC use eventually (hopefully can get this outside the [place] for him).  - Eventually would favor DCCV attempt. However, would not do DCCV at this time with fresh PE.  Would give some time for PE to stabilize/resolve  (possible DCCV in a month).  4. Lung mass: Right infrahilar.  Possible lung cancer, pulmonary following, will need bronchoscopy/EBUS when stable => possibly Tuesday.  He has recently resumed smoking, needs to quit.  5. Cirrhosis: CT concerning for possible hepatic cirrhosis.  6. CKD: Creatinine lower today at 1.2. 7. Hyponatremia: Mild, stable.   8. COPD exacerbation: Transitioned to prednisone.  9. Pleural calcification.   Marca Ancona 02/29/2016 12:25 PM

## 2016-02-29 NOTE — Progress Notes (Deleted)
Patient stated that she was having continued reflux.  Spoke to Dr Elisabeth Pigeon, verbal order for gi cocktail every 4 hours prn received.

## 2016-02-29 NOTE — Progress Notes (Signed)
Patient ID: Max Rivas, male   DOB: [birth year], 66 y.o.   MRN: [id number]  PROGRESS NOTE    Max Rivas  WUJ:[id number] DOB: 15-Dec-1950 DOA: 02/24/2016  PCP: Jyl Heinz, MD   Brief Narrative:  66 year old male with past medical history of ischemic cardiomyopathy (EF 25-30% by ECHO in 2012), recent hospitalization for acute decompensated CHF and atrial fibrillation on xarelto, PE and lung mass concerning for cancer. He presented to ED with shortness of breath at rest with exertion as well as leg swelling for 48 hours prior to this admission. Pt also had chest pain and associated productive cough, fever and chills.  D dimer was positive and CT angio chest was subsequently done and showed right pulmonary artery thrombus and possible lung mass and bilateral pleural effusion. Xarelto on discharge in 01/2016 was prescribed but pt never took it as he had to attend class in [place] before obtaining the medication.   Assessment & Plan:  Acute respiratory failure with hypoxia / Acute pulmonary embolism / Acute COPD exacerbation  - 2 D ECHO on this admission showed EF 25%, diffuse hypokinesis - Continue Lasix 80 mg IV Q 8 hours  - Stopped solumedrol 2/16  - Continue brovana and Pulmicort BID nebulizer  - Continue oxygen support via [place] to kep O2 sat above 90%  Right-sided pulmonary thrombus with right infrahilar mass, possible lung cancer  - Continue heparin drip  - Plan for EBUS on Tuesday next week if his respiratory status remains stable. EBUS deferred as pt high risk for resp failure   Atrial fibrillation with RVR  - CHADS vasc score 2 - Continue anticoagulation with heparin drip - Continue amiodarone drip and metoprolol  - Continue digoxin - Per cardio, eventually would favor DCCV attempt. However, would not do DCCV at this time with fresh PE. Would give time for PE to stabilize and then do DCCV in a month or so  Essential hypertension - Continue metoprolol 12.5 mg daily  - Continue  losartan 12.5 mg daily   Acute on chronic systolic CHF, NYHA class IIIb symptoms - Continue lasix 80 mg Q 8 hours; still volume overloaded, CVP 13-16 - Cardio gave 1 dose metolazone 2.5 x 1 on 2/15 - Continue milrinone drip  - Continue spironolactone 25 mg daily - Continue Toprol XL 12.5 mg daily  - Continue digoxin - Replace potassium   Hypokalemia / Metabolic alkalosis - Due to lasix - Replaced  Hyponatremia - Likely in the setting of acute lung process, possible malignancy - Sodium 128 --> 132 --> 134 --> 131  Dyslipidemia - Continue Lipitor and omega 3 supplementation   Chronic kidney disease stage 3 - Baseline Cr 1.55 about 1 month ago - Cr now WNL  Elevated TSH - TSH 8.8 on this admission - Needs follow up in outpt setting    DVT prophylaxis: Heparin drip Code Status: DNR/DNI Family Communication: no family at the bedside this am Disposition Plan: EBUS on Tuesday   Consultants:   Cardio  PCCM  Procedures:   None  Antimicrobials:   None    Subjective: No overnight events.    Objective: Vitals:   02/29/16 0020 02/29/16 0430 02/29/16 1013 02/29/16 1014  BP: (!) 98/54 109/68  118/71  Pulse: (!) 140 69 95 (!) 105  Resp: 16 (!) 21    Temp:  98.7 F (37.1 C)    TempSrc:  Oral    SpO2: 97% 100%    Weight:  112.6 kg (  248 lb 3.2 oz)    Height:        Intake/Output Summary (Last 24 hours) at 02/29/16 1236 Last data filed at 02/29/16 0400  Gross per 24 hour  Intake           676.89 ml  Output             2920 ml  Net         -2243.11 ml   Filed Weights   02/27/16 0344 02/28/16 0432 02/29/16 0430  Weight: 117.6 kg (259 lb 3.2 oz) 114.4 kg (252 lb 4.8 oz) 112.6 kg (248 lb 3.2 oz)    Examination:  General exam: No acute distress, calm and comfortable  Respiratory system: Bilateral wheezing, diminished breath sounds   Cardiovascular system: S1 & S2 heard, Rate controlled  Gastrointestinal system: (+) BS, non tender abd, not distended    Central nervous system: Nonfocal  Extremities: No tenderness, palpable pulses  Skin: +2 LE pitting edema, palpable pulses  Psychiatry: Normal mood and behavior, no restlessness   Data Reviewed: I have personally reviewed following labs and imaging studies  CBC:  Recent Labs Lab 02/24/16 0108 02/24/16 0621  02/26/16 0500 02/26/16 0818 02/27/16 0438 02/28/16 0450 02/29/16 0453  WBC 9.9 8.8  < > 5.6 5.7 10.5 10.2 10.2  NEUTROABS 6.4 5.4  --   --   --   --   --   --   HGB 16.1 15.9  < > 14.3 14.4 13.3 13.4 13.6  HCT 48.2 48.0  < > 43.3 43.9 40.9 41.6 42.0  MCV 94.1 94.9  < > 94.7 95.4 94.5 95.0 96.1  PLT 175 170  < > 143* 145* 148* 142* 135*  < > = values in this interval not displayed. Basic Metabolic Panel:  Recent Labs Lab 02/24/16 0621 02/25/16 0254 02/26/16 0500 02/27/16 0438 02/28/16 0450 02/29/16 0453  NA 128* 127* 132* 134* 134* 131*  K 4.2 4.9 3.1* 3.4* 3.6 4.4  CL 92* 94* 90* 88* 84* 79*  CO2 23 22 31  35* 39* 41*  GLUCOSE 101* 136* 240* 175* 130* 203*  BUN 32* 35* 35* 36* 30* 30*  CREATININE 1.47* 1.71* 1.48* 1.50* 1.39* 1.20  CALCIUM 8.8* 8.8* 8.7* 8.6* 9.1 8.8*  MG 2.1  --   --   --   --   --    GFR: Estimated Creatinine Clearance: 80.4 mL/min (by C-G formula based on SCr of 1.2 mg/dL). Liver Function Tests:  Recent Labs Lab 02/24/16 0621 02/25/16 0254  AST 30 31  ALT 35 35  ALKPHOS 92 96  BILITOT 2.3* 1.6*  PROT 6.1* 6.6  ALBUMIN 3.1* 3.1*   No results for input(s): LIPASE, AMYLASE in the last 168 hours. No results for input(s): AMMONIA in the last 168 hours. Coagulation Profile: No results for input(s): INR, PROTIME in the last 168 hours. Cardiac Enzymes:  Recent Labs Lab 02/24/16 0621 02/24/16 1217 02/24/16 1742  TROPONINI <0.03 <0.03 <0.03   BNP (last 3 results) No results for input(s): PROBNP in the last 8760 hours. HbA1C: No results for input(s): HGBA1C in the last 72 hours. CBG: No results for input(s): GLUCAP in the last  168 hours. Lipid Profile: No results for input(s): CHOL, HDL, LDLCALC, TRIG, CHOLHDL, LDLDIRECT in the last 72 hours. Thyroid Function Tests:  Recent Labs  02/28/16 0450  FREET4 0.76  T3FREE 1.9*   Anemia Panel: No results for input(s): VITAMINB12, FOLATE, FERRITIN, TIBC, IRON, RETICCTPCT in the last 72 hours.  Urine analysis: No results found for: COLORURINE, APPEARANCEUR, LABSPEC, PHURINE, GLUCOSEU, HGBUR, BILIRUBINUR, KETONESUR, PROTEINUR, UROBILINOGEN, NITRITE, LEUKOCYTESUR Sepsis Labs: @LABRCNTIP (procalcitonin:4,lacticidven:4)   Recent Results (from the past 240 hour(s))  MRSA PCR Screening     Status: None   Collection Time: 02/25/16  6:20 PM  Result Value Ref Range Status   MRSA by PCR NEGATIVE NEGATIVE Final      Radiology Studies: Ct Angio Chest Pe W And/or Wo Contrast Result Date: 02/24/2016 Thrombosis in the distal right main pulmonary artery and extending into lower lobe branches. No right heart strain. I am suspicious that there is a mass in the right infrahilar region causing narrowing of the right lower lobe bronchus and associated postobstructive change. This mass may also be compressing the right pulmonary artery. Indeterminate 7 mm nodule in the right upper lobe. Indeterminate borderline enlarged hilar and mediastinal lymph nodes. Bilateral pleural effusions with basilar atelectasis. Right-sided pleural thickening and pleural calcifications. Upper abdominal ascites with probable hepatic cirrhosis. These results were called by telephone at the time of interpretation on 02/24/2016 at 3:14 am to Dr. Ross Marcus , who verbally acknowledged these results. Electronically Signed   By: Burman Nieves M.D.   On: 02/24/2016 03:17   Dg Chest Port 1 View Result Date: 02/25/2016 The tip of the PICC line projects over the junction of the middle and distal thirds of the SVC. Electronically Signed   By: David  [place] M.D.   On: 02/25/2016 10:26   Dg Chest Portable 1  View Cardiac enlargement with interstitial edema, bilateral pleural effusions, and basilar atelectasis, similar to the prior study. Electronically Signed   By: Burman Nieves M.D.   On: 02/24/2016 01:47      Scheduled Meds: . arformoterol  15 mcg Nebulization BID  . aspirin EC  81 mg Oral Daily  . atorvastatin  20 mg Oral q1800  . budesonide  0.5 mg Inhalation BID  . digoxin  0.25 mg Oral Daily  . furosemide  80 mg Intravenous TID  . methylPREDNISol  40 mg Intravenous Q12H  . metoprolol succi  75 mg Oral BID  . multivitamin with m  1 tablet Oral Daily  . omega-3 acid ethyl   1 g Oral BID  . pantoprazole  40 mg Oral Daily  . spironolactone  12.5 mg Oral Daily   Continuous Infusions: . amiodarone 30 mg/hr (02/29/16 0209)  . heparin 1,550 Units/hr (02/29/16 0904)  . milrinone 0.25 mcg/kg/min (02/29/16 0905)     LOS: 5 days    Time spent: 15 minutes  Greater than 50% of the time spent on counseling and coordinating the care.   Manson Passey, MD [facility] Pager [phone]  If 7PM-7AM, please contact night-coverage [email or web address] Password [facility] 02/29/2016, 12:36 PM

## 2016-02-29 NOTE — Progress Notes (Signed)
ANTICOAGULATION CONSULT NOTE  Pharmacy Consult for Heparin Indication: pulmonary embolus  Allergies  Allergen Reactions  . Crestor [Rosuvastatin Calcium] Other (See Comments)    Back spasms    Patient Measurements: Height: 5\' 11"  (180.3 cm) Weight: 248 lb 3.2 oz (112.6 kg) IBW/kg (Calculated) : 75.3  Ht: 71 in Wt: 109 kg  IBW: 75 kg Heparin Dosing Weight: 99 klg  Vital Signs: Temp: 98.7 F (37.1 C) (02/17 0430) Temp Source: Oral (02/17 0430) BP: 109/68 (02/17 0430) Pulse Rate: 69 (02/17 0430)  Labs:  Recent Labs  02/27/16 0437 02/27/16 0438 02/28/16 0450 02/29/16 0453  HGB  --  13.3 13.4 13.6  HCT  --  40.9 41.6 42.0  PLT  --  148* 142* 135*  APTT  --   --  88*  --   HEPARINUNFRC 0.65  --  0.48 0.46  CREATININE  --  1.50* 1.39* 1.20    Estimated Creatinine Clearance: 80.4 mL/min (by C-G formula based on SCr of 1.2 mg/dL).   Assessment: 74 YOM with history of Afib prescribed Xarelto PTA but he couldn't start until he finishes an education class at the [facility]  Patient presented with a PE and started in IV heparin.  Heparin drip rate 1550 uts/hr, heparin level 0.46 is therapeutic and cbc has remained stable.   Goal of Therapy:  Heparin level 0.3-0.7 units/ml Monitor platelets by anticoagulation protocol: Yes    Plan:  - Continue heparin gtt at 1550 units/hr - Daily heparin level and CBC - F/U with transitioning patient to PO anticoagulation  Gwyndolyn Kaufman Bernette Redbird), PharmD  PGY1 Pharmacy Resident Pager: [phone] 02/29/2016 8:02 AM

## 2016-03-01 LAB — BASIC METABOLIC PANEL
Anion gap: 8 (ref 5–15)
BUN: 27 mg/dL — ABNORMAL HIGH (ref 6–20)
CALCIUM: 9.3 mg/dL (ref 8.9–10.3)
CHLORIDE: 76 mmol/L — AB (ref 101–111)
CO2: 43 mmol/L — ABNORMAL HIGH (ref 22–32)
CREATININE: 1.26 mg/dL — AB (ref 0.61–1.24)
GFR, EST NON AFRICAN AMERICAN: 59 mL/min — AB (ref >60)
Glucose, Bld: 139 mg/dL — ABNORMAL HIGH (ref 65–99)
Potassium: 4.9 mmol/L (ref 3.5–5.1)
SODIUM: 127 mmol/L — AB (ref 135–145)

## 2016-03-01 LAB — CBC
HCT: 44.3 % (ref 39.0–52.0)
HEMOGLOBIN: 14.4 g/dL (ref 13.0–17.0)
MCH: 31 pg (ref 26.0–34.0)
MCHC: 32.5 g/dL (ref 30.0–36.0)
MCV: 95.3 fL (ref 78.0–100.0)
PLATELETS: 118 10*3/uL — AB (ref 150–400)
RBC: 4.65 MIL/uL (ref 4.22–5.81)
RDW: 16 % — ABNORMAL HIGH (ref 11.5–15.5)
WBC: 8.6 10*3/uL (ref 4.0–10.5)

## 2016-03-01 LAB — HEPARIN LEVEL (UNFRACTIONATED): HEPARIN UNFRACTIONATED: 0.47 [IU]/mL (ref 0.30–0.70)

## 2016-03-01 LAB — COOXEMETRY PANEL
Carboxyhemoglobin: 1.3 % (ref 0.5–1.5)
Methemoglobin: 1 % (ref 0.0–1.5)
O2 SAT: 71.8 %
TOTAL HEMOGLOBIN: 14.5 g/dL (ref 12.0–16.0)

## 2016-03-01 MED ORDER — METOLAZONE 5 MG PO TABS
2.5000 mg | ORAL_TABLET | Freq: Once | ORAL | Status: AC
  Administered 2016-03-01: 2.5 mg via ORAL
  Filled 2016-03-01: qty 1

## 2016-03-01 NOTE — Progress Notes (Signed)
Patient ID: Max Rivas, male   DOB: Oct 20, 1950, 66 y.o.   MRN: [id number]  PROGRESS NOTE    LUISENRIQUE SHAM  IPJ:[id number] DOB: 1950/03/06 DOA: 02/24/2016  PCP: Jyl Heinz, MD   Brief Narrative:  66 year old male with past medical history of ischemic cardiomyopathy (EF 25-30% by ECHO in 2012), recent hospitalization for acute decompensated CHF and atrial fibrillation on xarelto, PE and lung mass concerning for cancer. He presented to ED with shortness of breath at rest with exertion as well as leg swelling for 48 hours prior to this admission. Pt also had chest pain and associated productive cough, fever and chills.  D dimer was positive and CT angio chest was subsequently done and showed right pulmonary artery thrombus and possible lung mass and bilateral pleural effusion. Xarelto on discharge in 01/2016 was prescribed but pt never took it as he had to attend class in [place] before obtaining the medication.   Assessment & Plan:  Acute respiratory failure with hypoxia / Acute pulmonary embolism / Acute COPD exacerbation  - 2 D ECHO on this admission showed EF 25%, diffuse hypokinesis - Continue Lasix 80 mg IV Q 8 hours  - Stopped solumedrol 2/16  - Continue brovana and Pulmicort BID nebulizer  - Continue oxygen support via [place] to kep O2 sat above 90% - Stable respiratory status this am   Right-sided pulmonary thrombus with right infrahilar mass, possible lung cancer  - Continue heparin drip  - Plan for EBUS on Tuesday next week if his respiratory status remains stable. EBUS deferred as pt high risk for resp failure   Atrial fibrillation with RVR  - CHADS vasc score 2 - Continue heparin drip - Continue amiodarone drip and metoprolol  - Continue digoxin - Per cardio, eventually would favor DCCV attempt. However, would not do DCCV at this time with fresh PE. Would give time for PE to stabilize and then do DCCV in a month or so  Essential hypertension - Continue metoprolol 12.5 mg  daily  - Continue losartan 12.5 mg daily   Acute on chronic systolic CHF, NYHA class IIIb symptoms - Continue lasix 80 mg Q 8 hours; still volume overloaded, CVP 13-16 - Cardio gave 1 dose metolazone 2.5 x 1 on 2/15 - Continue milrinone drip  - Continue spironolactone 25 mg daily - Continue Toprol XL 12.5 mg daily  - Continue digoxin - Replace potassium   Hypokalemia / Metabolic alkalosis - Due to lasix - Supplemented and WNL  Hyponatremia - Likely in the setting of acute lung process, possible malignancy - Sodium 128 --> 132 --> 134 --> 131 --> 127  Dyslipidemia - Continue Lipitor and omega 3 supplementation   Chronic kidney disease stage 3 - Baseline Cr 1.55 about 1 month ago - Cr within baseline range   Elevated TSH - TSH 8.8 on this admission - Needs follow up in outpt setting    DVT prophylaxis: Heparin drip Code Status: DNR/DNI Family Communication: no family at the bedside this am Disposition Plan: EBUS on Tuesday   Consultants:   Cardio  PCCM  Procedures:   None  Antimicrobials:   None    Subjective: No overnight events.    Objective: Vitals:   02/29/16 2300 03/01/16 0008 03/01/16 0437 03/01/16 0755  BP:  (!) 105/55 90/63   Pulse: (!) 101 79 77   Resp: 18 (!) 23 14   Temp:  98.9 F (37.2 C) 97.6 F (36.4 C)   TempSrc:  Oral  Oral   SpO2: 96% 91% 90% 94%  Weight:   111 kg (244 lb 12.8 oz)   Height:        Intake/Output Summary (Last 24 hours) at 03/01/16 0938 Last data filed at 03/01/16 0600  Gross per 24 hour  Intake            688.8 ml  Output             4500 ml  Net          -3811.2 ml   Filed Weights   02/28/16 0432 02/29/16 0430 03/01/16 0437  Weight: 114.4 kg (252 lb 4.8 oz) 112.6 kg (248 lb 3.2 oz) 111 kg (244 lb 12.8 oz)    Examination:  General exam: No acute distress Respiratory system: Bilateral wheezing, diminished breath sounds   Cardiovascular system: S1 & S2 heard, Rate controlled  Gastrointestinal system:  (+) BS, no distention, no tenderness  Central nervous system: Nonfocal  Extremities: No tenderness, palpable pulses  Skin: +2 LE pitting edema, palpable pulses  Psychiatry: no restlessness   Data Reviewed: I have personally reviewed following labs and imaging studies  CBC:  Recent Labs Lab 02/24/16 0108 02/24/16 0621  02/26/16 0818 02/27/16 0438 02/28/16 0450 02/29/16 0453 03/01/16 0450  WBC 9.9 8.8  < > 5.7 10.5 10.2 10.2 8.6  NEUTROABS 6.4 5.4  --   --   --   --   --   --   HGB 16.1 15.9  < > 14.4 13.3 13.4 13.6 14.4  HCT 48.2 48.0  < > 43.9 40.9 41.6 42.0 44.3  MCV 94.1 94.9  < > 95.4 94.5 95.0 96.1 95.3  PLT 175 170  < > 145* 148* 142* 135* 118*  < > = values in this interval not displayed. Basic Metabolic Panel:  Recent Labs Lab 02/24/16 0621  02/26/16 0500 02/27/16 0438 02/28/16 0450 02/29/16 0453 03/01/16 0450  NA 128*  < > 132* 134* 134* 131* 127*  K 4.2  < > 3.1* 3.4* 3.6 4.4 4.9  CL 92*  < > 90* 88* 84* 79* 76*  CO2 23  < > 31 35* 39* 41* 43*  GLUCOSE 101*  < > 240* 175* 130* 203* 139*  BUN 32*  < > 35* 36* 30* 30* 27*  CREATININE 1.47*  < > 1.48* 1.50* 1.39* 1.20 1.26*  CALCIUM 8.8*  < > 8.7* 8.6* 9.1 8.8* 9.3  MG 2.1  --   --   --   --   --   --   < > = values in this interval not displayed. GFR: Estimated Creatinine Clearance: 76 mL/min (by C-G formula based on SCr of 1.26 mg/dL (H)). Liver Function Tests:  Recent Labs Lab 02/24/16 0621 02/25/16 0254  AST 30 31  ALT 35 35  ALKPHOS 92 96  BILITOT 2.3* 1.6*  PROT 6.1* 6.6  ALBUMIN 3.1* 3.1*   No results for input(s): LIPASE, AMYLASE in the last 168 hours. No results for input(s): AMMONIA in the last 168 hours. Coagulation Profile: No results for input(s): INR, PROTIME in the last 168 hours. Cardiac Enzymes:  Recent Labs Lab 02/24/16 0621 02/24/16 1217 02/24/16 1742  TROPONINI <0.03 <0.03 <0.03   BNP (last 3 results) No results for input(s): PROBNP in the last 8760  hours. HbA1C: No results for input(s): HGBA1C in the last 72 hours. CBG: No results for input(s): GLUCAP in the last 168 hours. Lipid Profile: No results for input(s): CHOL, HDL, LDLCALC,  TRIG, CHOLHDL, LDLDIRECT in the last 72 hours. Thyroid Function Tests:  Recent Labs  02/28/16 0450  FREET4 0.76  T3FREE 1.9*   Anemia Panel: No results for input(s): VITAMINB12, FOLATE, FERRITIN, TIBC, IRON, RETICCTPCT in the last 72 hours. Urine analysis: No results found for: COLORURINE, APPEARANCEUR, LABSPEC, PHURINE, GLUCOSEU, HGBUR, BILIRUBINUR, KETONESUR, PROTEINUR, UROBILINOGEN, NITRITE, LEUKOCYTESUR Sepsis Labs: @LABRCNTIP (procalcitonin:4,lacticidven:4)   Recent Results (from the past 240 hour(s))  MRSA PCR Screening     Status: None   Collection Time: 02/25/16  6:20 PM  Result Value Ref Range Status   MRSA by PCR NEGATIVE NEGATIVE Final      Radiology Studies: Ct Angio Chest Pe W And/or Wo Contrast Result Date: 02/24/2016 Thrombosis in the distal right main pulmonary artery and extending into lower lobe branches. No right heart strain. I am suspicious that there is a mass in the right infrahilar region causing narrowing of the right lower lobe bronchus and associated postobstructive change. This mass may also be compressing the right pulmonary artery. Indeterminate 7 mm nodule in the right upper lobe. Indeterminate borderline enlarged hilar and mediastinal lymph nodes. Bilateral pleural effusions with basilar atelectasis. Right-sided pleural thickening and pleural calcifications. Upper abdominal ascites with probable hepatic cirrhosis. These results were called by telephone at the time of interpretation on 02/24/2016 at 3:14 am to Dr. Ross Marcus , who verbally acknowledged these results. Electronically Signed   By: Burman Nieves M.D.   On: 02/24/2016 03:17   Dg Chest Port 1 View Result Date: 02/25/2016 The tip of the PICC line projects over the junction of the middle and distal  thirds of the SVC. Electronically Signed   By: David  Swaziland M.D.   On: 02/25/2016 10:26   Dg Chest Portable 1 View Cardiac enlargement with interstitial edema, bilateral pleural effusions, and basilar atelectasis, similar to the prior study. Electronically Signed   By: Burman Nieves M.D.   On: 02/24/2016 01:47      Scheduled Meds: . arformoterol  15 mcg Nebulization BID  . aspirin EC  81 mg Oral Daily  . atorvastatin  20 mg Oral q1800  . budesonide  0.5 mg Inhalation BID  . digoxin  0.25 mg Oral Daily  . furosemide  80 mg Intravenous TID  . methylPREDNISol  40 mg Intravenous Q12H  . metoprolol succi  75 mg Oral BID  . multivitamin with m  1 tablet Oral Daily  . omega-3 acid ethyl   1 g Oral BID  . pantoprazole  40 mg Oral Daily  . spironolactone  12.5 mg Oral Daily   Continuous Infusions: . amiodarone 30 mg/hr (03/01/16 0004)  . heparin 1,550 Units/hr (03/01/16 0004)  . milrinone 0.25 mcg/kg/min (03/01/16 0914)     LOS: 6 days    Time spent: 15 minutes  Greater than 50% of the time spent on counseling and coordinating the care.   Manson Passey, MD [facility] Pager [phone]  If 7PM-7AM, please contact night-coverage [email or web address] Password [facility] 03/01/2016, 9:38 AM

## 2016-03-01 NOTE — Progress Notes (Signed)
Patient ID: Max Rivas, male   DOB: [birth year], 66 y.o.   MRN: [id number]    SUBJECTIVE: Co-ox low on 2/13, milrinone started.  Co-ox 72% today.  He is now on amiodarone for rate control.  He diuresed well again yesterday and weight is down 4 lbs. Breathing somewhat improved but CVP still high.  Creatinine stable.   Scheduled Meds: . arformoterol  15 mcg Nebulization BID  . aspirin EC  81 mg Oral Daily  . atorvastatin  20 mg Oral q1800  . budesonide  0.5 mg Inhalation BID  . digoxin  0.125 mg Oral Daily  . furosemide  80 mg Intravenous TID  . losartan  12.5 mg Oral BID  . metolazone  2.5 mg Oral Once  . metoprolol succinate  12.5 mg Oral Daily  . multivitamin with minerals  1 tablet Oral Daily  . omega-3 acid ethyl esters  1 g Oral BID  . pantoprazole  40 mg Oral Daily  . potassium chloride  60 mEq Oral BID  . predniSONE  20 mg Oral BID WC  . sodium chloride flush  10-40 mL Intracatheter Q12H  . spironolactone  25 mg Oral Daily   Continuous Infusions: . amiodarone 30 mg/hr (03/01/16 0004)  . heparin 1,550 Units/hr (03/01/16 0004)  . milrinone 0.25 mcg/kg/min (03/01/16 0914)   PRN Meds:.acetaminophen **OR** acetaminophen, levalbuterol, ondansetron **OR** ondansetron (ZOFRAN) IV, sodium chloride flush    Vitals:   02/29/16 2300 03/01/16 0008 03/01/16 0437 03/01/16 0755  BP:  (!) 105/55 90/63   Pulse: (!) 101 79 77   Resp: 18 (!) 23 14   Temp:  98.9 F (37.2 C) 97.6 F (36.4 C)   TempSrc:  Oral Oral   SpO2: 96% 91% 90% 94%  Weight:   244 lb 12.8 oz (111 kg)   Height:        Intake/Output Summary (Last 24 hours) at 03/01/16 1008 Last data filed at 03/01/16 0600  Gross per 24 hour  Intake            688.8 ml  Output             4500 ml  Net          -3811.2 ml    LABS: Basic Metabolic Panel:  Recent Labs  41/63/84 0453 03/01/16 0450  NA 131* 127*  K 4.4 4.9  CL 79* 76*  CO2 41* 43*  GLUCOSE 203* 139*  BUN 30* 27*  CREATININE 1.20 1.26*  CALCIUM 8.8*  9.3   Liver Function Tests: No results for input(s): AST, ALT, ALKPHOS, BILITOT, PROT, ALBUMIN in the last 72 hours. No results for input(s): LIPASE, AMYLASE in the last 72 hours. CBC:  Recent Labs  02/29/16 0453 03/01/16 0450  WBC 10.2 8.6  HGB 13.6 14.4  HCT 42.0 44.3  MCV 96.1 95.3  PLT 135* 118*   Cardiac Enzymes: No results for input(s): CKTOTAL, CKMB, CKMBINDEX, TROPONINI in the last 72 hours. BNP: Invalid input(s): POCBNP D-Dimer: No results for input(s): DDIMER in the last 72 hours. Hemoglobin A1C: No results for input(s): HGBA1C in the last 72 hours. Fasting Lipid Panel: No results for input(s): CHOL, HDL, LDLCALC, TRIG, CHOLHDL, LDLDIRECT in the last 72 hours. Thyroid Function Tests:  Recent Labs  02/28/16 0450  T3FREE 1.9*   Anemia Panel: No results for input(s): VITAMINB12, FOLATE, FERRITIN, TIBC, IRON, RETICCTPCT in the last 72 hours.  RADIOLOGY: Ct Soft Tissue Neck W Contrast  Result Date: 02/25/2016 CLINICAL DATA:  Lymphadenopathy EXAM: CT NECK WITH CONTRAST TECHNIQUE: Multidetector CT imaging of the neck was performed using the standard protocol following the bolus administration of intravenous contrast. CONTRAST:  75mL ISOVUE-300 IOPAMIDOL (ISOVUE-300) INJECTION 61% COMPARISON:  None. FINDINGS: Pharynx and larynx: The nasopharynx is clear. The oropharynx and hypopharynx are normal. The epiglottis, supraglottic larynx, glottis and subglottic larynx are normal. No retropharyngeal collection. The parapharyngeal spaces are preserved. The visible oral cavity and base of tongue are normal. Salivary glands: The parotid and submandibular glands are normal. No sialolithiasis or salivary ductal dilatation. Thyroid: Normal Lymph nodes: There is no enlarged or abnormal density cervical lymph node. Vascular: There is aortic atherosclerosis. There is bilateral atherosclerotic plaque at the carotid bifurcations, right worse than left. Limited intracranial: Normal. Visualized  orbits: Normal. Mastoids and visualized paranasal sinuses: Clear Skeleton: There are no lytic or blastic osseous lesions. No bony spinal canal stenosis. Upper chest: There is a medium-sized left pleural effusion. There is a large, partially calcified pleural plaque along the lateral right pleural surface. There is biapical emphysema. No pulmonary nodules or masses. Other: There is a a right-sided PICC line with tip below the field of view. IMPRESSION: 1. No cervical lymphadenopathy or mass. 2. Partially calcified right lateral pleural plaque, possibly reflecting prior asbestos exposure. 3. Medium-sized left pleural effusion. Electronically Signed   By: Deatra Robinson M.D.   On: 02/25/2016 22:19   Ct Angio Chest Pe W And/or Wo Contrast  Result Date: 02/24/2016 CLINICAL DATA:  Hypotension with positive D-dimer. EXAM: CT ANGIOGRAPHY CHEST WITH CONTRAST TECHNIQUE: Multidetector CT imaging of the chest was performed using the standard protocol during bolus administration of intravenous contrast. Multiplanar CT image reconstructions and MIPs were obtained to evaluate the vascular anatomy. CONTRAST:  100 mL Isovue 370 COMPARISON:  None. FINDINGS: Cardiovascular: Large filling defect beginning in the distal right main pulmonary artery and extending into the lower lobe branches. No flow is demonstrated in right lower lobe branches. Appearance is consistent with pulmonary arterial thrombus or embolus. Cardiac enlargement. RV to LV ratio is less than 1, suggesting no evidence of right heart strain. Reflux of contrast material into the hepatic veins is consistent with right heart failure. No pericardial effusion. Calcification of the thoracic aorta and coronary arteries. Mediastinum/Nodes: Scattered lymph nodes are present throughout the mediastinum and in both hilar regions. Right paratracheal lymph nodes measure about 1.8 cm short axis dimension. Appearance is nonspecific. These may be reactive nodes although pathologic  lymphadenopathy is not excluded. Lungs/Pleura: Diffuse right-sided pleural thickening with pleural calcifications. Bilateral pleural effusions. Basilar atelectasis bilaterally. Emphysematous changes in the lungs. Right upper lung nodule with mild spiculation measures 7 mm diameter. There is effacement and narrowing of the right lower lobe bronchus with postobstructive consolidation. Suspicion of a a right infrahilar mass measuring 3.4 cm diameter. This could also be causing narrowing of the right lower lobe pulmonary artery, resulting in the thrombosis. Upper Abdomen: Upper abdominal ascites is present. Enlarged lateral segment of the left lobe of the liver suggests cirrhosis. Diffuse soft tissue edema. Musculoskeletal: No chest wall abnormality. No acute or significant osseous findings. Review of the MIP images confirms the above findings. IMPRESSION: Thrombosis in the distal right main pulmonary artery and extending into lower lobe branches. No right heart strain. I am suspicious that there is a mass in the right infrahilar region causing narrowing of the right lower lobe bronchus and associated postobstructive change. This mass may also be compressing the right pulmonary artery. Indeterminate 7 mm nodule in  the right upper lobe. Indeterminate borderline enlarged hilar and mediastinal lymph nodes. Bilateral pleural effusions with basilar atelectasis. Right-sided pleural thickening and pleural calcifications. Upper abdominal ascites with probable hepatic cirrhosis. These results were called by telephone at the time of interpretation on 02/24/2016 at 3:14 am to Dr. Ross Marcus , who verbally acknowledged these results. Electronically Signed   By: Burman Nieves M.D.   On: 02/24/2016 03:17   Dg Chest Port 1 View  Result Date: 02/25/2016 CLINICAL DATA:  Status post right-sided PICC line placement. Assess positioning of the tip. EXAM: PORTABLE CHEST 1 VIEW COMPARISON:  Chest x-ray of February 24 2016.  FINDINGS: The PICC line tip projects over the junction of the middle and distal thirds of the SVC. No postprocedure pneumothorax is observed. There are bilateral pleural effusions which are stable. The cardiac silhouette is enlarged. The pulmonary vascularity is engorged. The ICD is in stable position where visualized. IMPRESSION: The tip of the PICC line projects over the junction of the middle and distal thirds of the SVC. Electronically Signed   By: David  Swaziland M.D.   On: 02/25/2016 10:26   Dg Chest Portable 1 View  Result Date: 02/24/2016 CLINICAL DATA:  Chest pain and shortness of breath.  Home oxygen. EXAM: PORTABLE CHEST 1 VIEW COMPARISON:  01/15/2016 FINDINGS: Cardiac pacemaker. Cardiac enlargement without significant pulmonary vascular congestion. Perihilar interstitial changes suggesting edema. Bilateral pleural effusions with basilar atelectasis, greater on the right. Appearances are similar to previous study. No pneumothorax. Mediastinal contours appear intact. IMPRESSION: Cardiac enlargement with interstitial edema, bilateral pleural effusions, and basilar atelectasis, similar to the prior study. Electronically Signed   By: Burman Nieves M.D.   On: 02/24/2016 01:47    PHYSICAL EXAM General: NAD Neck: JVP 16 cm, no thyromegaly or thyroid nodule.  Lungs: Distant BS, end expiratory wheezes.  CV: Nondisplaced PMI.  Heart mildly tachy, irregular S1/S2, no S3/S4, no murmur.  2+ edema into thighs.  No carotid bruit.  Normal pedal pulses.  Abdomen: Soft, nontender, no hepatosplenomegaly, no distention.  Neurologic: Alert and oriented x 3.  Psych: Normal affect. Extremities: No clubbing or cyanosis.   TELEMETRY: Reviewed telemetry pt in atrial fibrillation 90s-100s  ASSESSMENT AND PLAN: Patient has long history of ischemic cardiomyopathy, EF 25-30% by echo 2/12 (consistent with past).  He remains in atrial fibrillation with controlled rate.  He has a substantial PE.  He also has a lung  mass concerning for cancer and started back smoking a few days ago.  He came to the hospital because of dyspnea with minimal exertion and swelling.  1. Acute on chronic systolic CHF: Echo this admission with EF 25-30%, mild LV dilation, moderate MR, PASP 39 mmHg, RV moderately dilated/moderately dysfunction.  Ischemic cardiomyopathy, has MDT ICD.  NYHA class IIIb symptoms, marked volume overload on exam.  Exacerbation likely triggered by ongoing atrial fibrillation and RV failure made worse by PE.  Milrinone started with low co-ox, now 72%.  He is diuresing well with weight coming down but still quite volume overloaded on exam with elevated CVP. - Lasix 80 mg IV every 8 hrs, will give a dose of metolazone 2.5 x 1 again.   - Continue spironolactone 25 mg daily.  - Continue losartan 12.5 bid, no BP room to increase.  - Toprol XL cut back to 12.5 daily with low output.  - Continue digoxin 0.125, check level tomorrow.   2. PE: New diagnosis, noted in distal right main.  Trigger may be right infrahilar  mass/?lung cancer.   - Heparin gtt for now given possible need for procedures, pulmonary following.  Would favor DOAC eventually.  3. Atrial fibrillation: Persistent since 1/18 admission.  Rate is reasonable controlled on digoxin and Toprol XL (HR 90s-100s). Was not able to get Xarelto from the [place], unfortunately it appears that there is an hour long class that has to be attended before Xarelto will be dispensed, and he had not had time to go to the class. In the interim, he had a PE.   - Now on amiodarone gtt for rate control while on milrinone.  - Toprol XL 12.5 daily.   - Heparin gtt for now, would favor DOAC use eventually (hopefully can get this outside the [place] for him).  - Eventually would favor DCCV attempt. However, would not do DCCV at this time with fresh PE.  Would give some time for PE to stabilize/resolve (possible DCCV in a month).  4. Lung mass: Right infrahilar.  Possible lung cancer, pulmonary  following, will need bronchoscopy/EBUS when stable => possibly Tuesday.  He has recently resumed smoking, needs to quit.  5. Cirrhosis: CT concerning for possible hepatic cirrhosis.  6. CKD: Creatinine stable. 7. Hyponatremia: Mild, stable.   8. COPD exacerbation: Transitioned to prednisone.  9. Pleural calcification.   Marca Ancona 03/01/2016 10:08 AM

## 2016-03-01 NOTE — Progress Notes (Signed)
ANTICOAGULATION CONSULT NOTE  Pharmacy Consult for Heparin Indication: pulmonary embolus  Allergies  Allergen Reactions  . Crestor [Rosuvastatin Calcium] Other (See Comments)    Back spasms    Patient Measurements: Height: 5\' 11"  (180.3 cm) Weight: 244 lb 12.8 oz (111 kg) IBW/kg (Calculated) : 75.3  Ht: 71 in Wt: 109 kg  IBW: 75 kg Heparin Dosing Weight: 99 klg  Vital Signs: Temp: 97.6 F (36.4 C) (02/18 0437) Temp Source: Oral (02/18 0437) BP: 90/63 (02/18 0437) Pulse Rate: 77 (02/18 0437)  Labs:  Recent Labs  02/28/16 0450 02/29/16 0453 03/01/16 0450  HGB 13.4 13.6 14.4  HCT 41.6 42.0 44.3  PLT 142* 135* 118*  APTT 88*  --   --   HEPARINUNFRC 0.48 0.46 0.47  CREATININE 1.39* 1.20 1.26*    Estimated Creatinine Clearance: 76 mL/min (by C-G formula based on SCr of 1.26 mg/dL (H)).   Assessment: 46 YOM with history of Afib prescribed Xarelto PTA but he couldn't start until he finishes an education class at the [facility]  Patient presented with a PE and started in IV heparin.  Heparin drip rate 1550 units/hr, heparin level 0.47 is therapeutic -Hgb WNL, Plts down to 118 -no signs of bleeding  Goal of Therapy:  Heparin level 0.3-0.7 units/ml Monitor platelets by anticoagulation protocol: Yes   Plan:  - Continue heparin gtt at 1550 units/hr - Daily heparin level and CBC - F/U with transitioning patient to PO anticoagulation  Gwyndolyn Kaufman Bernette Redbird), PharmD  PGY1 Pharmacy Resident Pager: [phone] 03/01/2016 8:00 AM

## 2016-03-02 DIAGNOSIS — R918 Other nonspecific abnormal finding of lung field: Secondary | ICD-10-CM

## 2016-03-02 LAB — BASIC METABOLIC PANEL
ANION GAP: 13 (ref 5–15)
BUN: 32 mg/dL — ABNORMAL HIGH (ref 6–20)
CALCIUM: 9.7 mg/dL (ref 8.9–10.3)
CO2: 41 mmol/L — AB (ref 22–32)
Chloride: 76 mmol/L — ABNORMAL LOW (ref 101–111)
Creatinine, Ser: 1.47 mg/dL — ABNORMAL HIGH (ref 0.61–1.24)
GFR, EST AFRICAN AMERICAN: 57 mL/min — AB (ref >60)
GFR, EST NON AFRICAN AMERICAN: 49 mL/min — AB (ref >60)
GLUCOSE: 209 mg/dL — AB (ref 65–99)
POTASSIUM: 4.8 mmol/L (ref 3.5–5.1)
Sodium: 130 mmol/L — ABNORMAL LOW (ref 135–145)

## 2016-03-02 LAB — CBC
HEMATOCRIT: 45.3 % (ref 39.0–52.0)
Hemoglobin: 14.8 g/dL (ref 13.0–17.0)
MCH: 31.2 pg (ref 26.0–34.0)
MCHC: 32.7 g/dL (ref 30.0–36.0)
MCV: 95.4 fL (ref 78.0–100.0)
PLATELETS: 110 10*3/uL — AB (ref 150–400)
RBC: 4.75 MIL/uL (ref 4.22–5.81)
RDW: 15.9 % — AB (ref 11.5–15.5)
WBC: 9.4 10*3/uL (ref 4.0–10.5)

## 2016-03-02 LAB — COOXEMETRY PANEL
CARBOXYHEMOGLOBIN: 1.2 % (ref 0.5–1.5)
METHEMOGLOBIN: 0.9 % (ref 0.0–1.5)
O2 SAT: 69.8 %
Total hemoglobin: 15.2 g/dL (ref 12.0–16.0)

## 2016-03-02 LAB — HEPARIN LEVEL (UNFRACTIONATED): HEPARIN UNFRACTIONATED: 0.57 [IU]/mL (ref 0.30–0.70)

## 2016-03-02 LAB — DIGOXIN LEVEL: Digoxin Level: 0.8 ng/mL (ref 0.8–2.0)

## 2016-03-02 MED ORDER — LOSARTAN POTASSIUM 25 MG PO TABS
12.5000 mg | ORAL_TABLET | Freq: Every evening | ORAL | Status: DC
  Administered 2016-03-02 – 2016-03-03 (×2): 12.5 mg via ORAL
  Filled 2016-03-02 (×2): qty 1

## 2016-03-02 MED ORDER — SPIRONOLACTONE 25 MG PO TABS
12.5000 mg | ORAL_TABLET | Freq: Every day | ORAL | Status: DC
  Administered 2016-03-02 – 2016-03-04 (×3): 12.5 mg via ORAL
  Filled 2016-03-02 (×3): qty 1

## 2016-03-02 NOTE — Progress Notes (Signed)
Name: Max Rivas MRN: [id number] DOB: Nov 25, 1950    ADMISSION DATE:  02/24/2016 CONSULTATION DATE:  02/24/16  REFERRING MD :  Dr. Susie Cassette   CHIEF COMPLAINT:  Shortness of Breath   BRIEF SUMMARY:  66 y/o M, current smoker, who presented to [facility] on 2/12 with a 48 hour history of shortness of breath.    The patient was recently admitted 1/3-01/19/16 for a CHF decompensation and new dx of AF dischared on Xarelto (has not taken as he was to attend a class at the [place] before he could fill the Rx) and O2.  He reported on admit that he woke up with chest pain and shortness of breath.  He put his home O2 on at 2L without relief.  He initially reported his pain as 7/10.  He also noted he had been having abdominal pain and generalized swelling over the last two weeks without relief from lasix.  He endorses that he has been taking his blood pressure medication as prescribed.  The patient was admitted per [place] for further assessment.  CXR demonstrated cardiomegaly with interstitial edema, bilateral pleural effusions and basilar atelectasis.  The patient had hypotension and an elevated d-Dimer at 3.99.  Follow up CTA of the chest was obtained which demonstrated thrombosis in the distal right main pulmonary artery extending into lower lobe branches, no right heart strain, concern for possible mass in the right infrahilar region causing narrowing of the right lower lobe bronchus with post-obstructive changes, right sided pleural thickening, upper abdominal ascites with probable hepatic cirrhosis.  Labs - Na 128, K 4.2, Cl 92, Sr Cr 1.47, AG 13, Albumin 3.1, troponin 0.03, BNP 1633, WBC 8.8, Hgb 15.9 and platelets 170.    He has a known medical hx of CHF, CAD s/p MIx5, Pacemaker, COPD and GERD.    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    SUBJECTIVE:  Feeling back to baseline today.    VITAL SIGNS: Temp:   [97.5 F (36.4 C)-98.5 F (36.9 C)] 98.5 F (36.9 C) (02/19 0500) Pulse Rate:  [67-99] 92 (02/19 0821) Resp:  [18-22] 22 (02/18 2338) BP: (99-121)/(41-63) 108/59 (02/19 0821) SpO2:  [81 %-98 %] 90 % (02/19 0807) FiO2 (%):  [40 %] 40 % (02/19 0807) Weight:  [105.5 kg (232 lb 8 oz)] 105.5 kg (232 lb 8 oz) (02/19 0500)  PHYSICAL EXAMINATION: General:  Obese male in NAD on 5L O2. Sitting side of bed HEENT: [place]/AT, PERRL, minimal JVD Neuro: AAOx4, Non-focal CV: s1s2 rrr, no m/r/g. 3+ BLE pitting edema. PULM:  Poor air movement, rales L > R WU:JWJX, non-tender, bsx4 active  Skin: no rashes or lesions    Recent Labs Lab 02/29/16 0453 03/01/16 0450 03/02/16 0639  NA 131* 127* 130*  K 4.4 4.9 4.8  CL 79* 76* 76*  CO2 41* 43* 41*  BUN 30* 27* 32*  CREATININE 1.20 1.26* 1.47*  GLUCOSE 203* 139* 209*     Recent Labs Lab 02/29/16 0453 03/01/16 0450 03/02/16 0639  HGB 13.6 14.4 14.8  HCT 42.0 44.3 45.3  WBC 10.2 8.6 9.4  PLT 135* 118* 110*    No results found.    SIGNIFICANT EVENTS  2/12  Admit with SOB, hypotension/elevated D-dimer > CTA chest with concern for mass, R main pulm art thrombus  STUDIES:  2/12  CTA of the chest >> thrombosis in the distal right main pulmonary  artery extending into lower lobe branches, no right heart strain, concern for possible mass in the right infrahilar region causing narrowing of the right lower lobe bronchus with post-obstructive changes, right sided pleural thickening, upper abdominal ascites with probable hepatic cirrhosis.  2/12  ECHO >> mildly dilated LV, systolic function severely reduced, LVED 25-30%, diffuse hypokinesis, akinesis of inferolateral myocardium & basalinferior myocardium, mild MR, RA mildly dilated, PA peak 39 mm Hg 2/14 CT Neck >> no cervical lymphadenopathy or mass, partially calcified right lateral pleural plaque, medium sized left pleural effusion   ASSESSMENT / PLAN:  66 year old male former smoker with PMH  of COPD who presented to the ED with the chief complaint of SOB.  CTA was performed which revealed a thrombosis of the distal right pulmonary artery and a lung mass.  The patient was started on heparin and PCCM was called to evaluate for thrombus and mass.  Lung mass with Mediastinal Adenopathy: -Will need workup with EBUS likely -High risk for procedure at this point. Have arranged for him to follow up with Dr. Delton Coombes 3/13 for further eval.   Right pulmonary artery thrombus: - Remains on heparin infusion - Transition to oral agent per [facility]  Acute on Chronic Hypoxic Respiratory Failure: improving with diuresis - Per primary/cardiology - Continue diuresis as renal function permits.  COPD with Acute Exacerbation  - Continue nebs (brovana, budesonide, PRN xopenex) - Taper prednisone to 20mg  daily.   Joneen Roach, AGACNP-BC [facility] Pulmonology/Critical Care Pager [phone] or [phone]  03/02/2016 11:29 AM

## 2016-03-02 NOTE — Progress Notes (Signed)
ANTICOAGULATION CONSULT NOTE  Pharmacy Consult for Heparin Indication: pulmonary embolus  Allergies  Allergen Reactions  . Crestor [Rosuvastatin Calcium] Other (See Comments)    Back spasms    Patient Measurements: Height: 5\' 11"  (180.3 cm) Weight: 232 lb 8 oz (105.5 kg) IBW/kg (Calculated) : 75.3  Ht: 71 in Wt: 109 kg  IBW: 75 kg Heparin Dosing Weight: 99 klg  Vital Signs: Temp: 98.5 F (36.9 C) (02/19 0500) Temp Source: Oral (02/19 0500) BP: 108/59 (02/19 0821) Pulse Rate: 92 (02/19 0821)  Labs:  Recent Labs  02/29/16 0453 03/01/16 0450 03/02/16 0638 03/02/16 0639  HGB 13.6 14.4  --  14.8  HCT 42.0 44.3  --  45.3  PLT 135* 118*  --  110*  HEPARINUNFRC 0.46 0.47 0.57  --   CREATININE 1.20 1.26*  --  1.47*    Estimated Creatinine Clearance: 63.6 mL/min (by C-G formula based on SCr of 1.47 mg/dL (H)).   Assessment: 10 YOM with history of Afib prescribed Xarelto PTA but he couldn't start until he finishes an education class at the [facility]  Patient then presented with a new PE and started in IV heparin.  Heparin drip rate 1550 units/hr, heparin level 0.57 is therapeutic -Hgb WNL, Plts down to 110 slow trend down - watch -no signs of bleeding  Goal of Therapy:  Heparin level 0.3-0.7 units/ml Monitor platelets by anticoagulation protocol: Yes   Plan:  - Continue heparin gtt at 1550 units/hr - Daily heparin level and CBC - F/U with transitioning patient to PO anticoagulation  Leota Sauers Pharm.D. CPP, BCPS Clinical Pharmacist [phone] 03/02/2016 11:51 AM

## 2016-03-02 NOTE — Progress Notes (Signed)
Patient ID: Max Rivas, male   DOB: 16-Mar-1950, 66 y.o.   MRN: [id number]    SUBJECTIVE: Co-ox low on 2/13, milrinone started.  Co-ox 70% today.  He is now on amiodarone for rate control.  He diuresed well again yesterday and weight continues to fall.  CVP in 10 range this morning. Creatinine higher today.   Scheduled Meds: . arformoterol  15 mcg Nebulization BID  . aspirin EC  81 mg Oral Daily  . atorvastatin  20 mg Oral q1800  . budesonide  0.5 mg Inhalation BID  . digoxin  0.125 mg Oral Daily  . furosemide  80 mg Intravenous TID  . losartan  12.5 mg Oral QPM  . metoprolol succinate  12.5 mg Oral Daily  . multivitamin with minerals  1 tablet Oral Daily  . omega-3 acid ethyl esters  1 g Oral BID  . pantoprazole  40 mg Oral Daily  . potassium chloride  60 mEq Oral BID  . predniSONE  20 mg Oral BID WC  . sodium chloride flush  10-40 mL Intracatheter Q12H  . spironolactone  12.5 mg Oral Daily   Continuous Infusions: . amiodarone 30 mg/hr (03/02/16 0115)  . heparin 1,550 Units/hr (03/01/16 1932)  . milrinone 0.25 mcg/kg/min (03/02/16 0646)   PRN Meds:.acetaminophen **OR** acetaminophen, levalbuterol, ondansetron **OR** ondansetron (ZOFRAN) IV, sodium chloride flush    Vitals:   03/01/16 2010 03/01/16 2012 03/01/16 2338 03/02/16 0500  BP:  (!) 121/56  (!) 99/41  Pulse:  94 67 81  Resp:  18 (!) 22   Temp:  97.8 F (36.6 C)  98.5 F (36.9 C)  TempSrc:  Oral  Oral  SpO2: 95% 98% (!) 81% 92%  Weight:    232 lb 8 oz (105.5 kg)  Height:        Intake/Output Summary (Last 24 hours) at 03/02/16 0738 Last data filed at 03/02/16 0646  Gross per 24 hour  Intake             1556 ml  Output             7326 ml  Net            -5770 ml    LABS: Basic Metabolic Panel:  Recent Labs  40/98/11 0450 03/02/16 0639  NA 127* 130*  K 4.9 4.8  CL 76* 76*  CO2 43* 41*  GLUCOSE 139* 209*  BUN 27* 32*  CREATININE 1.26* 1.47*  CALCIUM 9.3 9.7   Liver Function Tests: No  results for input(s): AST, ALT, ALKPHOS, BILITOT, PROT, ALBUMIN in the last 72 hours. No results for input(s): LIPASE, AMYLASE in the last 72 hours. CBC:  Recent Labs  03/01/16 0450 03/02/16 0639  WBC 8.6 9.4  HGB 14.4 14.8  HCT 44.3 45.3  MCV 95.3 95.4  PLT 118* 110*   Cardiac Enzymes: No results for input(s): CKTOTAL, CKMB, CKMBINDEX, TROPONINI in the last 72 hours. BNP: Invalid input(s): POCBNP D-Dimer: No results for input(s): DDIMER in the last 72 hours. Hemoglobin A1C: No results for input(s): HGBA1C in the last 72 hours. Fasting Lipid Panel: No results for input(s): CHOL, HDL, LDLCALC, TRIG, CHOLHDL, LDLDIRECT in the last 72 hours. Thyroid Function Tests: No results for input(s): TSH, T4TOTAL, T3FREE, THYROIDAB in the last 72 hours.  Invalid input(s): FREET3 Anemia Panel: No results for input(s): VITAMINB12, FOLATE, FERRITIN, TIBC, IRON, RETICCTPCT in the last 72 hours.  RADIOLOGY: Ct Soft Tissue Neck W Contrast  Result Date: 02/25/2016 CLINICAL DATA:  Lymphadenopathy EXAM: CT NECK WITH CONTRAST TECHNIQUE: Multidetector CT imaging of the neck was performed using the standard protocol following the bolus administration of intravenous contrast. CONTRAST:  43mL ISOVUE-300 IOPAMIDOL (ISOVUE-300) INJECTION 61% COMPARISON:  None. FINDINGS: Pharynx and larynx: The nasopharynx is clear. The oropharynx and hypopharynx are normal. The epiglottis, supraglottic larynx, glottis and subglottic larynx are normal. No retropharyngeal collection. The parapharyngeal spaces are preserved. The visible oral cavity and base of tongue are normal. Salivary glands: The parotid and submandibular glands are normal. No sialolithiasis or salivary ductal dilatation. Thyroid: Normal Lymph nodes: There is no enlarged or abnormal density cervical lymph node. Vascular: There is aortic atherosclerosis. There is bilateral atherosclerotic plaque at the carotid bifurcations, right worse than left. Limited  intracranial: Normal. Visualized orbits: Normal. Mastoids and visualized paranasal sinuses: Clear Skeleton: There are no lytic or blastic osseous lesions. No bony spinal canal stenosis. Upper chest: There is a medium-sized left pleural effusion. There is a large, partially calcified pleural plaque along the lateral right pleural surface. There is biapical emphysema. No pulmonary nodules or masses. Other: There is a a right-sided PICC line with tip below the field of view. IMPRESSION: 1. No cervical lymphadenopathy or mass. 2. Partially calcified right lateral pleural plaque, possibly reflecting prior asbestos exposure. 3. Medium-sized left pleural effusion. Electronically Signed   By: Deatra Robinson M.D.   On: 02/25/2016 22:19   Ct Angio Chest Pe W And/or Wo Contrast  Result Date: 02/24/2016 CLINICAL DATA:  Hypotension with positive D-dimer. EXAM: CT ANGIOGRAPHY CHEST WITH CONTRAST TECHNIQUE: Multidetector CT imaging of the chest was performed using the standard protocol during bolus administration of intravenous contrast. Multiplanar CT image reconstructions and MIPs were obtained to evaluate the vascular anatomy. CONTRAST:  100 mL Isovue 370 COMPARISON:  None. FINDINGS: Cardiovascular: Large filling defect beginning in the distal right main pulmonary artery and extending into the lower lobe branches. No flow is demonstrated in right lower lobe branches. Appearance is consistent with pulmonary arterial thrombus or embolus. Cardiac enlargement. RV to LV ratio is less than 1, suggesting no evidence of right heart strain. Reflux of contrast material into the hepatic veins is consistent with right heart failure. No pericardial effusion. Calcification of the thoracic aorta and coronary arteries. Mediastinum/Nodes: Scattered lymph nodes are present throughout the mediastinum and in both hilar regions. Right paratracheal lymph nodes measure about 1.8 cm short axis dimension. Appearance is nonspecific. These may be  reactive nodes although pathologic lymphadenopathy is not excluded. Lungs/Pleura: Diffuse right-sided pleural thickening with pleural calcifications. Bilateral pleural effusions. Basilar atelectasis bilaterally. Emphysematous changes in the lungs. Right upper lung nodule with mild spiculation measures 7 mm diameter. There is effacement and narrowing of the right lower lobe bronchus with postobstructive consolidation. Suspicion of a a right infrahilar mass measuring 3.4 cm diameter. This could also be causing narrowing of the right lower lobe pulmonary artery, resulting in the thrombosis. Upper Abdomen: Upper abdominal ascites is present. Enlarged lateral segment of the left lobe of the liver suggests cirrhosis. Diffuse soft tissue edema. Musculoskeletal: No chest wall abnormality. No acute or significant osseous findings. Review of the MIP images confirms the above findings. IMPRESSION: Thrombosis in the distal right main pulmonary artery and extending into lower lobe branches. No right heart strain. I am suspicious that there is a mass in the right infrahilar region causing narrowing of the right lower lobe bronchus and associated postobstructive change. This mass may also be compressing the right pulmonary artery. Indeterminate 7 mm nodule in  the right upper lobe. Indeterminate borderline enlarged hilar and mediastinal lymph nodes. Bilateral pleural effusions with basilar atelectasis. Right-sided pleural thickening and pleural calcifications. Upper abdominal ascites with probable hepatic cirrhosis. These results were called by telephone at the time of interpretation on 02/24/2016 at 3:14 am to Dr. Ross Marcus , who verbally acknowledged these results. Electronically Signed   By: Burman Nieves M.D.   On: 02/24/2016 03:17   Dg Chest Port 1 View  Result Date: 02/25/2016 CLINICAL DATA:  Status post right-sided PICC line placement. Assess positioning of the tip. EXAM: PORTABLE CHEST 1 VIEW COMPARISON:  Chest  x-ray of February 24 2016. FINDINGS: The PICC line tip projects over the junction of the middle and distal thirds of the SVC. No postprocedure pneumothorax is observed. There are bilateral pleural effusions which are stable. The cardiac silhouette is enlarged. The pulmonary vascularity is engorged. The ICD is in stable position where visualized. IMPRESSION: The tip of the PICC line projects over the junction of the middle and distal thirds of the SVC. Electronically Signed   By: David  [place] M.D.   On: 02/25/2016 10:26   Dg Chest Portable 1 View  Result Date: 02/24/2016 CLINICAL DATA:  Chest pain and shortness of breath.  Home oxygen. EXAM: PORTABLE CHEST 1 VIEW COMPARISON:  01/15/2016 FINDINGS: Cardiac pacemaker. Cardiac enlargement without significant pulmonary vascular congestion. Perihilar interstitial changes suggesting edema. Bilateral pleural effusions with basilar atelectasis, greater on the right. Appearances are similar to previous study. No pneumothorax. Mediastinal contours appear intact. IMPRESSION: Cardiac enlargement with interstitial edema, bilateral pleural effusions, and basilar atelectasis, similar to the prior study. Electronically Signed   By: Burman Nieves M.D.   On: 02/24/2016 01:47    PHYSICAL EXAM General: NAD Neck: JVP 12-14 cm, no thyromegaly or thyroid nodule.  Lungs: Distant BS, end expiratory wheezes.  CV: Nondisplaced PMI.  Heart mildly tachy, irregular S1/S2, no S3/S4, no murmur.  2+ edema into thighs.  No carotid bruit.  Normal pedal pulses.  Abdomen: Soft, nontender, no hepatosplenomegaly, no distention.  Neurologic: Alert and oriented x 3.  Psych: Normal affect. Extremities: No clubbing or cyanosis.   TELEMETRY: Reviewed telemetry pt in atrial fibrillation 80s-100s  ASSESSMENT AND PLAN: Patient has long history of ischemic cardiomyopathy, EF 25-30% by echo 2/12 (consistent with past).  He remains in atrial fibrillation with controlled rate.  He has a  substantial PE.  He also has a lung mass concerning for cancer and started back smoking a few days ago.  He came to the hospital because of dyspnea with minimal exertion and swelling.  1. Acute on chronic systolic CHF: Echo this admission with EF 25-30%, mild LV dilation, moderate MR, PASP 39 mmHg, RV moderately dilated/moderately dysfunction.  Ischemic cardiomyopathy, has MDT ICD.  NYHA class IIIb symptoms, marked volume overload on exam.  Exacerbation likely triggered by ongoing atrial fibrillation and RV failure made worse by PE.  Milrinone started with low co-ox, now 70%.  He is diuresing well with weight coming down but still quite volume overloaded on exam (JVD and much peripheral edema) though CVP is lower today. - Lasix 80 mg IV every 8 hrs, hold off on metolazone given rise in creatinine to slow diuresis a bit.   - Decrease spironolactone to 12.5 daily with soft BP.   - Decrease losartan to once daily in the evening with soft BP.   - Toprol XL cut back to 12.5 daily with low output.  - Continue digoxin 0.125, level ok today.  2. PE: New diagnosis, noted in distal right main.  Trigger may be right infrahilar mass/?lung cancer.   - Heparin gtt for now given possible need for procedures, pulmonary following.  Would favor DOAC eventually.  3. Atrial fibrillation: Persistent since 1/18 admission.  Rate is reasonable controlled on digoxin and Toprol XL (HR 90s-100s). Was not able to get Xarelto from the [place], unfortunately it appears that there is an hour long class that has to be attended before Xarelto will be dispensed, and he had not had time to go to the class. In the interim, he had a PE.   - Now on amiodarone gtt for rate control while on milrinone.  - Toprol XL 12.5 daily.   - Heparin gtt for now, would favor DOAC use eventually (hopefully can get this outside the [place] for him).  - Eventually would favor DCCV attempt. However, would not do DCCV at this time with fresh PE.  Would give some time  for PE to stabilize/resolve (possible DCCV in a month).  4. Lung mass: Right infrahilar.  Possible lung cancer, pulmonary following, will need bronchoscopy/EBUS when stable => possibly Tuesday.  He has recently resumed smoking, needs to quit.  5. Cirrhosis: CT concerning for possible hepatic cirrhosis.  6. CKD: Creatinine higher today, will back off a bit on diuresis.  7. Hyponatremia: Mild, stable.   8. COPD exacerbation: Transitioned to prednisone.  9. Pleural calcification.   Marca Ancona 03/02/2016 7:38 AM

## 2016-03-02 NOTE — Progress Notes (Signed)
Patient ID: Max Rivas, male   DOB: 11-27-1950, 66 y.o.   MRN: [id number]  PROGRESS NOTE    Max LEMMERMAN  XLK:[id number] DOB: Aug 25, 1950 DOA: 02/24/2016  PCP: Jyl Heinz, MD   Brief Narrative:  66 year old male with past medical history of ischemic cardiomyopathy (EF 25-30% by ECHO in 2012), recent hospitalization for acute decompensated CHF and atrial fibrillation on xarelto, PE and lung mass concerning for cancer. He presented to ED with shortness of breath at rest with exertion as well as leg swelling for 48 hours prior to this admission. Pt also had chest pain and associated productive cough, fever and chills.  D dimer was positive and CT angio chest was subsequently done and showed right pulmonary artery thrombus and possible lung mass and bilateral pleural effusion. Xarelto on discharge in 01/2016 was prescribed but pt never took it as he had to attend class in [place] before obtaining the medication.  Barrier to discharge: Plan for EBUS tomorrow 2/20.  Assessment & Plan:  Acute respiratory failure with hypoxia / Acute pulmonary embolism / Acute COPD exacerbation  - 2 D ECHO on this admission showed EF 25%, diffuse hypokinesis - Continue Lasix 80 mg IV Q 8 hours  - Stopped solumedrol 2/16; currently on prednisone  - Continue brovana and Pulmicort BID nebulizer  - Continue oxygen support via [place] to [place] O2 sat above 90%  Right-sided pulmonary thrombus with right infrahilar mass, possible lung cancer  - Continue heparin drip for now - Plan for EBUS tomorrow   Atrial fibrillation with RVR  - CHADS vasc score 2 - Continue heparin drip - Continue amiodarone drip and metoprolol 12.5 mg daily  - Continue digoxin 0.125 mg daily  - Per cardio, eventually would favor DCCV attempt. However, would not do DCCV at this time with fresh PE. Would give time for PE to stabilize and then do DCCV in a month or so  Thrombocytopenia - Likely in the setting of heparin anticoagulation - Will need  to monitor CBC closely  - Platelets 110 this am   Essential hypertension - Continue metoprolol 12.5 mg daily  - Continue losartan 12.5 mg daily   Acute on chronic systolic CHF, NYHA class IIIb symptoms - Continue lasix 80 mg Q 8 hours per cardiology recommendations  - Continue metolazone (started 02/25/16) 2.5 mg given daily  - Continue milrinone drip  - Continue spironolactone 12.5 mg daily, reduced from 25 mg due to soft BP - Continue Toprol XL 12.5 mg daily  - Continue losartan, 12.5 mg daily  - Continue digoxin 0.1254 mg daily  - Replace potassium   Hypokalemia / Metabolic alkalosis - Due to lasix - Continue to supplement   Hyponatremia - Likely in the setting of acute lung process, possible malignancy - Sodium stable   Dyslipidemia - Continue Lipitor and omega 3 supplementation   Chronic kidney disease stage 3 - Baseline Cr 1.55 about 1 month ago - Cr within baseline range although has normalized on 2./17 and starting to go back up to 1.47 this am   Elevated TSH - TSH 8.8 on this admission - Needs follow up in outpt setting    DVT prophylaxis: Heparin drip Code Status: DNR/DNI Family Communication: no family at the bedside this am Disposition Plan: EBUS on Tuesday   Consultants:   Cardio  PCCM  Procedures:   None  Antimicrobials:   None    Subjective: No overnight events.    Objective: Vitals:   03/01/16  2338 03/02/16 0500 03/02/16 0807 03/02/16 0821  BP:  (!) 99/41  (!) 108/59  Pulse: 67 81  92  Resp: (!) 22     Temp:  98.5 F (36.9 C)    TempSrc:  Oral    SpO2: (!) 81% 92% 90%   Weight:  105.5 kg (232 lb 8 oz)    Height:        Intake/Output Summary (Last 24 hours) at 03/02/16 1023 Last data filed at 03/02/16 1000  Gross per 24 hour  Intake          1987.93 ml  Output             7776 ml  Net         -5788.07 ml   Filed Weights   02/29/16 0430 03/01/16 0437 03/02/16 0500  Weight: 112.6 kg (248 lb 3.2 oz) 111 kg (244 lb 12.8 oz)  105.5 kg (232 lb 8 oz)    Examination:  General exam: No acute distress, calm and comfortable  Respiratory system: Bilateral wheezing, diminished breath sounds   Cardiovascular system: S1 & S2 heard, Rate controlled  Gastrointestinal system: (+) BS, non tender, no distended  Central nervous system: No focal deficits  Extremities: No tenderness, palpable pulses bilaterally  Skin: +2 LE pitting edema, palpable pulses  Psychiatry: normal mood and behavior   Data Reviewed: I have personally reviewed following labs and imaging studies  CBC:  Recent Labs Lab 02/27/16 0438 02/28/16 0450 02/29/16 0453 03/01/16 0450 03/02/16 0639  WBC 10.5 10.2 10.2 8.6 9.4  HGB 13.3 13.4 13.6 14.4 14.8  HCT 40.9 41.6 42.0 44.3 45.3  MCV 94.5 95.0 96.1 95.3 95.4  PLT 148* 142* 135* 118* 110*   Basic Metabolic Panel:  Recent Labs Lab 02/27/16 0438 02/28/16 0450 02/29/16 0453 03/01/16 0450 03/02/16 0639  NA 134* 134* 131* 127* 130*  K 3.4* 3.6 4.4 4.9 4.8  CL 88* 84* 79* 76* 76*  CO2 35* 39* 41* 43* 41*  GLUCOSE 175* 130* 203* 139* 209*  BUN 36* 30* 30* 27* 32*  CREATININE 1.50* 1.39* 1.20 1.26* 1.47*  CALCIUM 8.6* 9.1 8.8* 9.3 9.7   GFR: Estimated Creatinine Clearance: 63.6 mL/min (by C-G formula based on SCr of 1.47 mg/dL (H)). Liver Function Tests:  Recent Labs Lab 02/25/16 0254  AST 31  ALT 35  ALKPHOS 96  BILITOT 1.6*  PROT 6.6  ALBUMIN 3.1*   No results for input(s): LIPASE, AMYLASE in the last 168 hours. No results for input(s): AMMONIA in the last 168 hours. Coagulation Profile: No results for input(s): INR, PROTIME in the last 168 hours. Cardiac Enzymes:  Recent Labs Lab 02/24/16 1217 02/24/16 1742  TROPONINI <0.03 <0.03   BNP (last 3 results) No results for input(s): PROBNP in the last 8760 hours. HbA1C: No results for input(s): HGBA1C in the last 72 hours. CBG: No results for input(s): GLUCAP in the last 168 hours. Lipid Profile: No results for  input(s): CHOL, HDL, LDLCALC, TRIG, CHOLHDL, LDLDIRECT in the last 72 hours. Thyroid Function Tests: No results for input(s): TSH, T4TOTAL, FREET4, T3FREE, THYROIDAB in the last 72 hours. Anemia Panel: No results for input(s): VITAMINB12, FOLATE, FERRITIN, TIBC, IRON, RETICCTPCT in the last 72 hours. Urine analysis: No results found for: COLORURINE, APPEARANCEUR, LABSPEC, PHURINE, GLUCOSEU, HGBUR, BILIRUBINUR, KETONESUR, PROTEINUR, UROBILINOGEN, NITRITE, LEUKOCYTESUR Sepsis Labs: @LABRCNTIP (procalcitonin:4,lacticidven:4)   Recent Results (from the past 240 hour(s))  MRSA PCR Screening     Status: None   Collection Time: 02/25/16  6:20 PM  Result Value Ref Range Status   MRSA by PCR NEGATIVE NEGATIVE Final      Radiology Studies:  Ct Angio Chest Pe W And/or Wo Contrast Result Date: 02/24/2016 Thrombosis in the distal right main pulmonary artery and extending into lower lobe branches. No right heart strain. I am suspicious that there is a mass in the right infrahilar region causing narrowing of the right lower lobe bronchus and associated postobstructive change. This mass may also be compressing the right pulmonary artery. Indeterminate 7 mm nodule in the right upper lobe. Indeterminate borderline enlarged hilar and mediastinal lymph nodes. Bilateral pleural effusions with basilar atelectasis. Right-sided pleural thickening and pleural calcifications. Upper abdominal ascites with probable hepatic cirrhosis. These results were called by telephone at the time of interpretation on 02/24/2016 at 3:14 am to Dr. Ross Marcus , who verbally acknowledged these results. Electronically Signed   By: Burman Nieves M.D.   On: 02/24/2016 03:17   Dg Chest Port 1 View Result Date: 02/25/2016 The tip of the PICC line projects over the junction of the middle and distal thirds of the SVC. Electronically Signed   By: David  [place] M.D.   On: 02/25/2016 10:26   Dg Chest Portable 1 View Cardiac enlargement  with interstitial edema, bilateral pleural effusions, and basilar atelectasis, similar to the prior study. Electronically Signed   By: Burman Nieves M.D.   On: 02/24/2016 01:47    . arformoterol  15 mcg Nebulization BID  . aspirin EC  81 mg Oral Daily  . atorvastatin  20 mg Oral q1800  . budesonide  0.5 mg Inhalation BID  . digoxin  0.125 mg Oral Daily  . furosemide  80 mg Intravenous TID  . losartan  12.5 mg Oral QPM  . metoprolol succina  12.5 mg Oral Daily  . multivitamin with mi  1 tablet Oral Daily  . omega-3 acid ethyl   1 g Oral BID  . pantoprazole  40 mg Oral Daily  . potassium chloride  60 mEq Oral BID  . predniSONE  20 mg Oral BID WC  . spironolactone  12.5 mg Oral Daily    Continuous Infusions: . amiodarone 30 mg/hr (03/02/16 0115)  . heparin 1,550 Units/hr (03/01/16 1932)  . milrinone 0.25 mcg/kg/min (03/02/16 0646)     LOS: 7 days    Time spent: 25 minutes  Greater than 50% of the time spent on counseling and coordinating the care.   Manson Passey, MD [facility] Pager [phone]  If 7PM-7AM, please contact night-coverage [email or web address] Password [facility] 03/02/2016, 10:23 AM

## 2016-03-03 DIAGNOSIS — J9621 Acute and chronic respiratory failure with hypoxia: Secondary | ICD-10-CM

## 2016-03-03 LAB — BASIC METABOLIC PANEL
ANION GAP: 12 (ref 5–15)
BUN: 33 mg/dL — ABNORMAL HIGH (ref 6–20)
CO2: 40 mmol/L — ABNORMAL HIGH (ref 22–32)
Calcium: 9.4 mg/dL (ref 8.9–10.3)
Chloride: 76 mmol/L — ABNORMAL LOW (ref 101–111)
Creatinine, Ser: 1.33 mg/dL — ABNORMAL HIGH (ref 0.61–1.24)
GFR calc Af Amer: >60 mL/min (ref >60)
GFR calc non Af Amer: 55 mL/min — ABNORMAL LOW (ref >60)
Glucose, Bld: 263 mg/dL — ABNORMAL HIGH (ref 65–99)
POTASSIUM: 4.4 mmol/L (ref 3.5–5.1)
SODIUM: 128 mmol/L — AB (ref 135–145)

## 2016-03-03 LAB — COOXEMETRY PANEL
Carboxyhemoglobin: 1.2 % (ref 0.5–1.5)
Methemoglobin: 1 % (ref 0.0–1.5)
O2 Saturation: 81.3 %
Total hemoglobin: 16.4 g/dL — ABNORMAL HIGH (ref 12.0–16.0)

## 2016-03-03 LAB — CBC
HCT: 47.6 % (ref 39.0–52.0)
HEMOGLOBIN: 16 g/dL (ref 13.0–17.0)
MCH: 31.6 pg (ref 26.0–34.0)
MCHC: 33.6 g/dL (ref 30.0–36.0)
MCV: 93.9 fL (ref 78.0–100.0)
Platelets: 129 10*3/uL — ABNORMAL LOW (ref 150–400)
RBC: 5.07 MIL/uL (ref 4.22–5.81)
RDW: 15.3 % (ref 11.5–15.5)
WBC: 11.7 10*3/uL — ABNORMAL HIGH (ref 4.0–10.5)

## 2016-03-03 LAB — HEPARIN LEVEL (UNFRACTIONATED): Heparin Unfractionated: 0.43 IU/mL (ref 0.30–0.70)

## 2016-03-03 MED ORDER — RIVAROXABAN 15 MG PO TABS: 15.0000 mg | ORAL_TABLET | Freq: Two times a day (BID) | ORAL | Status: DC

## 2016-03-03 MED ORDER — RIVAROXABAN 15 MG PO TABS
15.0000 mg | ORAL_TABLET | Freq: Two times a day (BID) | ORAL | Status: DC
  Administered 2016-03-03 – 2016-03-05 (×5): 15 mg via ORAL
  Filled 2016-03-03 (×5): qty 1

## 2016-03-03 MED ORDER — RIVAROXABAN 20 MG PO TABS: 20.0000 mg | ORAL_TABLET | Freq: Every day | ORAL | Status: DC

## 2016-03-03 MED ORDER — TORSEMIDE 20 MG PO TABS
60.0000 mg | ORAL_TABLET | Freq: Every day | ORAL | Status: DC
  Administered 2016-03-03 – 2016-03-05 (×3): 60 mg via ORAL
  Filled 2016-03-03 (×3): qty 3

## 2016-03-03 MED ORDER — POTASSIUM CHLORIDE CRYS ER 20 MEQ PO TBCR
40.0000 meq | EXTENDED_RELEASE_TABLET | Freq: Every day | ORAL | Status: DC
  Administered 2016-03-04: 40 meq via ORAL
  Filled 2016-03-03: qty 4

## 2016-03-03 NOTE — Progress Notes (Addendum)
Patient ID: Max Rivas, male   DOB: Jun 24, 1950, 66 y.o.   MRN: [id number]  PROGRESS NOTE    Max Rivas  TIW:[id number] DOB: 09/04/1950 DOA: 02/24/2016  PCP: Jyl Heinz, MD   Brief Narrative:  66 year old male with past medical history of ischemic cardiomyopathy (EF 25-30% by ECHO in 2012), recent hospitalization for acute decompensated CHF and atrial fibrillation on xarelto, PE and lung mass concerning for cancer. He presented to ED with shortness of breath at rest with exertion as well as leg swelling for 48 hours prior to this admission. Pt also had chest pain and associated productive cough, fever and chills.  D dimer was positive and CT angio chest was subsequently done and showed right pulmonary artery thrombus and possible lung mass and bilateral pleural effusion. Xarelto on discharge in 01/2016 was prescribed but pt never took it as he had to attend class in [place] before obtaining the medication.   Assessment & Plan:  Acute respiratory failure with hypoxia / Acute pulmonary embolism / Acute COPD exacerbation  - 2 D ECHO on this admission showed EF 25%, diffuse hypokinesis - Was on Lasix 80 mg IV Q 8 hours; cardio stopped today and started Demadex  - Stopped solumedrol 2/16; currently on prednisone 20 mg PO BID - Continue brovana and Pulmicort BID nebulizer  - Continue oxygen support via [place] to [place] O2 sat above 90%  Right-sided pulmonary thrombus with right infrahilar mass, possible lung cancer  - Continue heparin drip for now - Per pulmonary, outpt follow up with Dr. Delton Coombes in few to several weeks and outpt PET scan. If all of this resolves then this mass possible post PE infarction, if nto then he needs biopsy. Avoid bronch due to sedation risk  Atrial fibrillation with RVR  - CHADS vasc score 2 - Stopped heparin drip today and usinf xarelto per cardio recommendations  - Continue amiodarone drip and metoprolol 12.5 mg daily  - Continue digoxin 0.125 mg daily  - Per cardio,  eventually would favor DCCV attempt. However, would not do DCCV at this time with fresh PE. Would give time for PE to stabilize and then do DCCV in a month or so  Thrombocytopenia - Likely in the setting of heparin anticoagulation - Will need to monitor CBC closely  - Platelets 110 --> 129  Essential hypertension - Continue metoprolol 12.5 mg daily  - Continue losartan 12.5 mg daily   Acute on chronic systolic CHF, NYHA class IIIb symptoms - Continue lasix 80 mg Q 8 hours per cardiology recommendations  - Continue metolazone (started 02/25/16) 2.5 mg given daily; last dose 2/18 - Continue milrinone drip  - Continue spironolactone 12.5 mg daily, reduced from 25 mg due to soft BP - Continue Toprol XL 12.5 mg daily  - Continue losartan, 12.5 mg daily  - Continue digoxin 0.1254 mg daily  - Replace potassium   Hypokalemia / Metabolic alkalosis - Due to lasix - Continue to supplement   Hyponatremia - Likely in the setting of acute lung process, diuretic  - Sodium 128-130  Dyslipidemia - Continue Lipitor and omega 3 supplementation   Chronic kidney disease stage 3 - Baseline Cr 1.55 about 1 month ago - Cr within baseline range   Elevated TSH - TSH 8.8 on this admission - Needs follow up in outpt setting    DVT prophylaxis: Now on xarelto  Code Status: DNR/DNI Family Communication: no family at the bedside this am Disposition Plan: transitioned to xarelto;  home once okay per cardio; pt still on amiodarone drip   Consultants:   Cardio  PCCM  Procedures:   None  Antimicrobials:   None    Subjective: No overnight events.    Objective: Vitals:   03/03/16 0845 03/03/16 0848 03/03/16 1109 03/03/16 1143  BP:   97/63 (!) 117/39  Pulse:    60  Resp:    17  Temp:      TempSrc:      SpO2: 93% 93%  92%  Weight:      Height:        Intake/Output Summary (Last 24 hours) at 03/03/16 1651 Last data filed at 03/03/16 1400  Gross per 24 hour  Intake            1810.1 ml  Output             5650 ml  Net          -3839.9 ml   Filed Weights   03/01/16 0437 03/02/16 0500 03/03/16 0500  Weight: 111 kg (244 lb 12.8 oz) 105.5 kg (232 lb 8 oz) 100.7 kg (221 lb 14.4 oz)    Examination:  General exam: No acute distress Respiratory system: Bilateral wheezing, diminished breath sounds   Cardiovascular system: S1 & S2 heard, Rate controlled  Gastrointestinal system: (+) BS, non tender abdomen  Central nervous system: Nonfocal  Extremities: No tenderness, palpable pulses bilaterally  Skin: +2 LE pitting edema, palpable pulses  Psychiatry: normal mood, no agitation   Data Reviewed: I have personally reviewed following labs and imaging studies  CBC:  Recent Labs Lab 02/28/16 0450 02/29/16 0453 03/01/16 0450 03/02/16 0639 03/03/16 0440  WBC 10.2 10.2 8.6 9.4 11.7*  HGB 13.4 13.6 14.4 14.8 16.0  HCT 41.6 42.0 44.3 45.3 47.6  MCV 95.0 96.1 95.3 95.4 93.9  PLT 142* 135* 118* 110* 129*   Basic Metabolic Panel:  Recent Labs Lab 02/28/16 0450 02/29/16 0453 03/01/16 0450 03/02/16 0639 03/03/16 0756  NA 134* 131* 127* 130* 128*  K 3.6 4.4 4.9 4.8 4.4  CL 84* 79* 76* 76* 76*  CO2 39* 41* 43* 41* 40*  GLUCOSE 130* 203* 139* 209* 263*  BUN 30* 30* 27* 32* 33*  CREATININE 1.39* 1.20 1.26* 1.47* 1.33*  CALCIUM 9.1 8.8* 9.3 9.7 9.4   GFR: Estimated Creatinine Clearance: 68.8 mL/min (by C-G formula based on SCr of 1.33 mg/dL (H)). Liver Function Tests: No results for input(s): AST, ALT, ALKPHOS, BILITOT, PROT, ALBUMIN in the last 168 hours. No results for input(s): LIPASE, AMYLASE in the last 168 hours. No results for input(s): AMMONIA in the last 168 hours. Coagulation Profile: No results for input(s): INR, PROTIME in the last 168 hours. Cardiac Enzymes: No results for input(s): CKTOTAL, CKMB, CKMBINDEX, TROPONINI in the last 168 hours. BNP (last 3 results) No results for input(s): PROBNP in the last 8760 hours. HbA1C: No results for  input(s): HGBA1C in the last 72 hours. CBG: No results for input(s): GLUCAP in the last 168 hours. Lipid Profile: No results for input(s): CHOL, HDL, LDLCALC, TRIG, CHOLHDL, LDLDIRECT in the last 72 hours. Thyroid Function Tests: No results for input(s): TSH, T4TOTAL, FREET4, T3FREE, THYROIDAB in the last 72 hours. Anemia Panel: No results for input(s): VITAMINB12, FOLATE, FERRITIN, TIBC, IRON, RETICCTPCT in the last 72 hours. Urine analysis: No results found for: COLORURINE, APPEARANCEUR, LABSPEC, PHURINE, GLUCOSEU, HGBUR, BILIRUBINUR, KETONESUR, PROTEINUR, UROBILINOGEN, NITRITE, LEUKOCYTESUR Sepsis Labs: @LABRCNTIP (procalcitonin:4,lacticidven:4)   Recent Results (from the past 240 hour(s))  MRSA PCR Screening     Status: None   Collection Time: 02/25/16  6:20 PM  Result Value Ref Range Status   MRSA by PCR NEGATIVE NEGATIVE Final      Radiology Studies:  Ct Angio Chest Pe W And/or Wo Contrast Result Date: 02/24/2016 Thrombosis in the distal right main pulmonary artery and extending into lower lobe branches. No right heart strain. I am suspicious that there is a mass in the right infrahilar region causing narrowing of the right lower lobe bronchus and associated postobstructive change. This mass may also be compressing the right pulmonary artery. Indeterminate 7 mm nodule in the right upper lobe. Indeterminate borderline enlarged hilar and mediastinal lymph nodes. Bilateral pleural effusions with basilar atelectasis. Right-sided pleural thickening and pleural calcifications. Upper abdominal ascites with probable hepatic cirrhosis. These results were called by telephone at the time of interpretation on 02/24/2016 at 3:14 am to Dr. Ross Marcus , who verbally acknowledged these results. Electronically Signed   By: Burman Nieves M.D.   On: 02/24/2016 03:17   Dg Chest Port 1 View Result Date: 02/25/2016 The tip of the PICC line projects over the junction of the middle and distal thirds  of the SVC. Electronically Signed   By: David  [place] M.D.   On: 02/25/2016 10:26   Dg Chest Portable 1 View Cardiac enlargement with interstitial edema, bilateral pleural effusions, and basilar atelectasis, similar to the prior study. Electronically Signed   By: Burman Nieves M.D.   On: 02/24/2016 01:47    . arformoterol  15 mcg Nebulization BID  . aspirin EC  81 mg Oral Daily  . atorvastatin  20 mg Oral q1800  . budesonide  0.5 mg Inhalation BID  . digoxin  0.125 mg Oral Daily  . furosemide  80 mg Intravenous TID  . losartan  12.5 mg Oral QPM  . metoprolol succina  12.5 mg Oral Daily  . multivitamin with mi  1 tablet Oral Daily  . omega-3 acid ethyl   1 g Oral BID  . pantoprazole  40 mg Oral Daily  . potassium chloride  60 mEq Oral BID  . predniSONE  20 mg Oral BID WC  . spironolactone  12.5 mg Oral Daily    Continuous Infusions: . amiodarone 30 mg/hr (03/03/16 1201)  . milrinone 0.125 mcg/kg/min (03/03/16 1536)     LOS: 8 days    Time spent: 25 minutes  Greater than 50% of the time spent on counseling and coordinating the care.   Manson Passey, MD [facility] Pager [phone]  If 7PM-7AM, please contact night-coverage [email or web address] Password [facility] 03/03/2016, 4:51 PM

## 2016-03-03 NOTE — Progress Notes (Signed)
Patient ID: Max Rivas, male   DOB: [birth year], 66 y.o.   MRN: [id number]    SUBJECTIVE: Co-ox low on 2/13, milrinone started.  Co-ox 81.33% this am on milrinone 0.25 mcg/kg/min.  CVP 7-8  Feeling ok this morning.  No SOB at rest.  Up and down all night to pee with no dizziness or lightheadedness. 02 sats ~ 89.  On chronic 02 at home.   Out 5.9L and down 11 lbs.   BMET pending.    Scheduled Meds: . arformoterol  15 mcg Nebulization BID  . aspirin EC  81 mg Oral Daily  . atorvastatin  20 mg Oral q1800  . budesonide  0.5 mg Inhalation BID  . digoxin  0.125 mg Oral Daily  . furosemide  80 mg Intravenous TID  . losartan  12.5 mg Oral QPM  . metoprolol succinate  12.5 mg Oral Daily  . multivitamin with minerals  1 tablet Oral Daily  . omega-3 acid ethyl esters  1 g Oral BID  . pantoprazole  40 mg Oral Daily  . potassium chloride  60 mEq Oral BID  . predniSONE  20 mg Oral BID WC  . sodium chloride flush  10-40 mL Intracatheter Q12H  . spironolactone  12.5 mg Oral Daily   Continuous Infusions: . amiodarone 30 mg/hr (03/03/16 0700)  . heparin 1,550 Units/hr (03/03/16 0700)  . milrinone 0.25 mcg/kg/min (03/03/16 0700)   PRN Meds:.acetaminophen **OR** acetaminophen, levalbuterol, ondansetron **OR** ondansetron (ZOFRAN) IV, sodium chloride flush    Vitals:   03/02/16 2300 03/03/16 0010 03/03/16 0500 03/03/16 0749  BP:  95/61 94/66 (!) 95/45  Pulse: (!) 111 72 88 88  Resp: (!) 26 15 20 18   Temp:   97.5 F (36.4 C) 97.5 F (36.4 C)  TempSrc:   Oral Oral  SpO2: 91% 91% 95% 92%  Weight:   221 lb 14.4 oz (100.7 kg)   Height:        Intake/Output Summary (Last 24 hours) at 03/03/16 0753 Last data filed at 03/03/16 0700  Gross per 24 hour  Intake          1942.03 ml  Output             7900 ml  Net         -5957.97 ml    LABS: Basic Metabolic Panel:  Recent Labs  40/98/11 0450 03/02/16 0639  NA 127* 130*  K 4.9 4.8  CL 76* 76*  CO2 43* 41*  GLUCOSE 139* 209*    BUN 27* 32*  CREATININE 1.26* 1.47*  CALCIUM 9.3 9.7   Liver Function Tests: No results for input(s): AST, ALT, ALKPHOS, BILITOT, PROT, ALBUMIN in the last 72 hours. No results for input(s): LIPASE, AMYLASE in the last 72 hours. CBC:  Recent Labs  03/02/16 0639 03/03/16 0440  WBC 9.4 11.7*  HGB 14.8 16.0  HCT 45.3 47.6  MCV 95.4 93.9  PLT 110* 129*   Cardiac Enzymes: No results for input(s): CKTOTAL, CKMB, CKMBINDEX, TROPONINI in the last 72 hours. BNP: Invalid input(s): POCBNP D-Dimer: No results for input(s): DDIMER in the last 72 hours. Hemoglobin A1C: No results for input(s): HGBA1C in the last 72 hours. Fasting Lipid Panel: No results for input(s): CHOL, HDL, LDLCALC, TRIG, CHOLHDL, LDLDIRECT in the last 72 hours. Thyroid Function Tests: No results for input(s): TSH, T4TOTAL, T3FREE, THYROIDAB in the last 72 hours.  Invalid input(s): FREET3 Anemia Panel: No results for input(s): VITAMINB12, FOLATE, FERRITIN, TIBC, IRON, RETICCTPCT in  the last 72 hours.  RADIOLOGY: Ct Soft Tissue Neck W Contrast  Result Date: 02/25/2016 CLINICAL DATA:  Lymphadenopathy EXAM: CT NECK WITH CONTRAST TECHNIQUE: Multidetector CT imaging of the neck was performed using the standard protocol following the bolus administration of intravenous contrast. CONTRAST:  75mL ISOVUE-300 IOPAMIDOL (ISOVUE-300) INJECTION 61% COMPARISON:  None. FINDINGS: Pharynx and larynx: The nasopharynx is clear. The oropharynx and hypopharynx are normal. The epiglottis, supraglottic larynx, glottis and subglottic larynx are normal. No retropharyngeal collection. The parapharyngeal spaces are preserved. The visible oral cavity and base of tongue are normal. Salivary glands: The parotid and submandibular glands are normal. No sialolithiasis or salivary ductal dilatation. Thyroid: Normal Lymph nodes: There is no enlarged or abnormal density cervical lymph node. Vascular: There is aortic atherosclerosis. There is bilateral  atherosclerotic plaque at the carotid bifurcations, right worse than left. Limited intracranial: Normal. Visualized orbits: Normal. Mastoids and visualized paranasal sinuses: Clear Skeleton: There are no lytic or blastic osseous lesions. No bony spinal canal stenosis. Upper chest: There is a medium-sized left pleural effusion. There is a large, partially calcified pleural plaque along the lateral right pleural surface. There is biapical emphysema. No pulmonary nodules or masses. Other: There is a a right-sided PICC line with tip below the field of view. IMPRESSION: 1. No cervical lymphadenopathy or mass. 2. Partially calcified right lateral pleural plaque, possibly reflecting prior asbestos exposure. 3. Medium-sized left pleural effusion. Electronically Signed   By: Deatra Robinson M.D.   On: 02/25/2016 22:19   Ct Angio Chest Pe W And/or Wo Contrast  Result Date: 02/24/2016 CLINICAL DATA:  Hypotension with positive D-dimer. EXAM: CT ANGIOGRAPHY CHEST WITH CONTRAST TECHNIQUE: Multidetector CT imaging of the chest was performed using the standard protocol during bolus administration of intravenous contrast. Multiplanar CT image reconstructions and MIPs were obtained to evaluate the vascular anatomy. CONTRAST:  100 mL Isovue 370 COMPARISON:  None. FINDINGS: Cardiovascular: Large filling defect beginning in the distal right main pulmonary artery and extending into the lower lobe branches. No flow is demonstrated in right lower lobe branches. Appearance is consistent with pulmonary arterial thrombus or embolus. Cardiac enlargement. RV to LV ratio is less than 1, suggesting no evidence of right heart strain. Reflux of contrast material into the hepatic veins is consistent with right heart failure. No pericardial effusion. Calcification of the thoracic aorta and coronary arteries. Mediastinum/Nodes: Scattered lymph nodes are present throughout the mediastinum and in both hilar regions. Right paratracheal lymph nodes  measure about 1.8 cm short axis dimension. Appearance is nonspecific. These may be reactive nodes although pathologic lymphadenopathy is not excluded. Lungs/Pleura: Diffuse right-sided pleural thickening with pleural calcifications. Bilateral pleural effusions. Basilar atelectasis bilaterally. Emphysematous changes in the lungs. Right upper lung nodule with mild spiculation measures 7 mm diameter. There is effacement and narrowing of the right lower lobe bronchus with postobstructive consolidation. Suspicion of a a right infrahilar mass measuring 3.4 cm diameter. This could also be causing narrowing of the right lower lobe pulmonary artery, resulting in the thrombosis. Upper Abdomen: Upper abdominal ascites is present. Enlarged lateral segment of the left lobe of the liver suggests cirrhosis. Diffuse soft tissue edema. Musculoskeletal: No chest wall abnormality. No acute or significant osseous findings. Review of the MIP images confirms the above findings. IMPRESSION: Thrombosis in the distal right main pulmonary artery and extending into lower lobe branches. No right heart strain. I am suspicious that there is a mass in the right infrahilar region causing narrowing of the right lower lobe bronchus  and associated postobstructive change. This mass may also be compressing the right pulmonary artery. Indeterminate 7 mm nodule in the right upper lobe. Indeterminate borderline enlarged hilar and mediastinal lymph nodes. Bilateral pleural effusions with basilar atelectasis. Right-sided pleural thickening and pleural calcifications. Upper abdominal ascites with probable hepatic cirrhosis. These results were called by telephone at the time of interpretation on 02/24/2016 at 3:14 am to Dr. Ross Marcus , who verbally acknowledged these results. Electronically Signed   By: Burman Nieves M.D.   On: 02/24/2016 03:17   Dg Chest Port 1 View  Result Date: 02/25/2016 CLINICAL DATA:  Status post right-sided PICC line  placement. Assess positioning of the tip. EXAM: PORTABLE CHEST 1 VIEW COMPARISON:  Chest x-ray of February 24 2016. FINDINGS: The PICC line tip projects over the junction of the middle and distal thirds of the SVC. No postprocedure pneumothorax is observed. There are bilateral pleural effusions which are stable. The cardiac silhouette is enlarged. The pulmonary vascularity is engorged. The ICD is in stable position where visualized. IMPRESSION: The tip of the PICC line projects over the junction of the middle and distal thirds of the SVC. Electronically Signed   By: David  [place] M.D.   On: 02/25/2016 10:26   Dg Chest Portable 1 View  Result Date: 02/24/2016 CLINICAL DATA:  Chest pain and shortness of breath.  Home oxygen. EXAM: PORTABLE CHEST 1 VIEW COMPARISON:  01/15/2016 FINDINGS: Cardiac pacemaker. Cardiac enlargement without significant pulmonary vascular congestion. Perihilar interstitial changes suggesting edema. Bilateral pleural effusions with basilar atelectasis, greater on the right. Appearances are similar to previous study. No pneumothorax. Mediastinal contours appear intact. IMPRESSION: Cardiac enlargement with interstitial edema, bilateral pleural effusions, and basilar atelectasis, similar to the prior study. Electronically Signed   By: Burman Nieves M.D.   On: 02/24/2016 01:47    PHYSICAL EXAM General: NAD Neck: JVP ~7-8 cm, no thyromegaly or thyroid nodule.  Lungs: Distant BS, end expiratory wheezes.  CV: Nondisplaced PMI.  Heart mildly tachy, irregular S1/S2, no S3/S4, no murmur.  2+ edema into thighs.  No carotid bruit.  Normal pedal pulses.  Abdomen: Soft, nontender, no hepatosplenomegaly, no distention.  Neurologic: Alert and oriented x 3.  Psych: Normal affect. Extremities: No clubbing or cyanosis.   TELEMETRY: Reviewed telemetry pt in atrial fibrillation 80s-100s  ASSESSMENT AND PLAN: Patient has long history of ischemic cardiomyopathy, EF 25-30% by echo 2/12  (consistent with past).  He remains in atrial fibrillation with controlled rate.  He has a substantial PE.  He also has a lung mass concerning for cancer and started back smoking a few days ago.  He came to the hospital because of dyspnea with minimal exertion and swelling.   1. Acute on chronic systolic CHF: Echo this admission with EF 25-30%, mild LV dilation, moderate MR, PASP 39 mmHg, RV moderately dilated/moderately dysfunction.  Ischemic cardiomyopathy, has MDT ICD.  NYHA class IIIb symptoms, marked volume overload on exam at admission.  Exacerbation likely triggered by ongoing atrial fibrillation and RV failure made worse by PE.  Still has marked lower extremity edema, but weight down 40 lbs overall and CVP 7-8 this morning => suspect venous insufficiency in addition to CHF.  Creatinine starting to rise.  - Transition to po torsemide, will use 60 mg once daily if creatinine relatively stable today (pending).  - Decrease milrinone to 0.125.  - Continue spironolactone to 12.5 daily with soft BP.   - Continue losartan 12.5 mg QHS with soft BP.    -  Tolerating Toprol XL 12.5 daily with low output.  - Continue digoxin 0.125, level ok 03/02/16.   2. PE: New diagnosis this admission noted in distal right main.  Trigger may be right infrahilar mass/?lung cancer.   - No procedure planned this admission, start back on Xarelto.   3. Atrial fibrillation: Persistent since 1/18 admission.  Rate is reasonable controlled on digoxin and Toprol XL (HR 90s-100s). Was not able to get Xarelto from the [place], unfortunately it appears that there is an hour long class that has to be attended before Xarelto will be dispensed, and he had not had time to go to the class. In the interim, he had a PE.   - Remains on amio gtt for rate control while on milrinone.   - Toprol XL 12.5 daily.   - Start Xarelto and stop heparin gtt.  - Eventually would favor DCCV attempt. However, would not do DCCV at this time with fresh PE.  Would  give some time for PE to stabilize/resolve (possible DCCV in a month), but will need to be on blood thinner.  4. Lung mass: Right infrahilar.  Possible lung cancer, pulmonary following, will need bronchoscopy/EBUS when stable => not as inpatient, per pulmonary, will have him come back as outpatient.  - Needs to stop smoking 5. Cirrhosis: CT concerning for possible hepatic cirrhosis.  6. CKD: Marked diuresis again and BMET pending.  7. Hyponatremia: Has been mild and stable.  - BMET pending.  8. COPD exacerbation: Transitioned to prednisone.  9. Pleural calcification.   Graciella Freer, PA-C  03/03/2016 7:53 AM  Advanced Heart Failure Team Pager [phone] (M-F; 7a - 4p)  Please contact [facility] for night-coverage after hours (4p -7a ) and weekends on [email or web address]  Patient seen with PA, agree with the above note.  Weight down 40 lbs total and CVP 7-8.  Still has a lot of peripheral edema but may be a component of venous insufficiency as well as CHF.  Will transition to po torsemide 60 mg daily if creatinine is stable and will decrease milrinone to 0.125 today.  Transition to Xarelto.   Marca Ancona 03/03/2016 8:23 AM

## 2016-03-03 NOTE — Progress Notes (Signed)
ANTICOAGULATION CONSULT NOTE  Pharmacy Consult for Heparin > change to Xarelto Indication: pulmonary embolus  Allergies  Allergen Reactions  . Crestor [Rosuvastatin Calcium] Other (See Comments)    Back spasms    Patient Measurements: Height: 5\' 11"  (180.3 cm) Weight: 221 lb 14.4 oz (100.7 kg) IBW/kg (Calculated) : 75.3  Ht: 71 in Wt: 109 kg  IBW: 75 kg Heparin Dosing Weight: 99 klg  Vital Signs: Temp: 97.5 F (36.4 C) (02/20 0749) Temp Source: Oral (02/20 0749) BP: 95/45 (02/20 0749) Pulse Rate: 88 (02/20 0749)  Labs:  Recent Labs  03/01/16 0450 03/02/16 1007 03/02/16 0639 03/03/16 0440  HGB 14.4  --  14.8 16.0  HCT 44.3  --  45.3 47.6  PLT 118*  --  110* 129*  HEPARINUNFRC 0.47 0.57  --  0.43  CREATININE 1.26*  --  1.47*  --     Estimated Creatinine Clearance: 62.2 mL/min (by C-G formula based on SCr of 1.47 mg/dL (H)).   Assessment: 30 YOM with history of Afib prescribed Xarelto PTA but he couldn't start until he finishes an education class at the [facility]  Patient then presented with a new PE and started in IV heparin.  Heparin drip rate 1550 units/hr, heparin level 0.43 is therapeutic this AM.  -Hgb WNL, Plts stable -no signs of bleeding  Pharmacy asked to change IV heparin to Xarelto today.  Goal of Therapy:  Heparin level 0.3-0.7 units/ml Monitor platelets by anticoagulation protocol: Yes   Plan:  -D/c IV heparin -Start Xarelto 15 mg BID x 21 days, then on 3/13 will change to Xarelto 20 mg daily. -F/u with case management about ability to obtain Xarelto as an outpatient. -Will need education prior to discharge.  Tad Moore, BCPS  Clinical Pharmacist Pager [phone]  03/03/2016 9:30 AM

## 2016-03-03 NOTE — Progress Notes (Signed)
Nutrition Brief Note  Patient identified on the Malnutrition Screening Tool (MST) Report  Wt Readings from Last 15 Encounters:  03/03/16 221 lb 14.4 oz (100.7 kg)  01/19/16 241 lb 6.4 oz (109.5 kg)    Body mass index is 30.95 kg/m. Patient meets criteria for obese based on current BMI.   Current diet order is heart healthy, patient is consuming approximately 100% of meals at this time. Labs and medications reviewed.   No nutrition interventions warranted at this time. If nutrition issues arise, please consult RD.   Betsey Holiday, RD, LDN Pager #- [phone]

## 2016-03-03 NOTE — Progress Notes (Signed)
NCM contacted [facility]# [phone] ext [phone], left message for Victorino Dike CSW for follow up on possible arranging [facility] RN. Waiting return phone call. Isidoro Donning RN CCM Case Mgmt phone [phone]

## 2016-03-04 LAB — BASIC METABOLIC PANEL
Anion gap: 11 (ref 5–15)
BUN: 31 mg/dL — AB (ref 6–20)
CHLORIDE: 78 mmol/L — AB (ref 101–111)
CO2: 41 mmol/L — ABNORMAL HIGH (ref 22–32)
Calcium: 9.3 mg/dL (ref 8.9–10.3)
Creatinine, Ser: 1.35 mg/dL — ABNORMAL HIGH (ref 0.61–1.24)
GFR calc Af Amer: >60 mL/min (ref >60)
GFR calc non Af Amer: 54 mL/min — ABNORMAL LOW (ref >60)
Glucose, Bld: 140 mg/dL — ABNORMAL HIGH (ref 65–99)
POTASSIUM: 4.6 mmol/L (ref 3.5–5.1)
SODIUM: 130 mmol/L — AB (ref 135–145)

## 2016-03-04 LAB — COOXEMETRY PANEL
Carboxyhemoglobin: 1.4 % (ref 0.5–1.5)
METHEMOGLOBIN: 0.6 % (ref 0.0–1.5)
O2 Saturation: 61.2 %
TOTAL HEMOGLOBIN: 16.9 g/dL — AB (ref 12.0–16.0)

## 2016-03-04 LAB — CBC
HCT: 46.9 % (ref 39.0–52.0)
HEMOGLOBIN: 15.7 g/dL (ref 13.0–17.0)
MCH: 31.2 pg (ref 26.0–34.0)
MCHC: 33.5 g/dL (ref 30.0–36.0)
MCV: 93.1 fL (ref 78.0–100.0)
Platelets: 121 10*3/uL — ABNORMAL LOW (ref 150–400)
RBC: 5.04 MIL/uL (ref 4.22–5.81)
RDW: 15.3 % (ref 11.5–15.5)
WBC: 11.5 10*3/uL — ABNORMAL HIGH (ref 4.0–10.5)

## 2016-03-04 MED ORDER — LOSARTAN POTASSIUM 25 MG PO TABS
12.5000 mg | ORAL_TABLET | Freq: Two times a day (BID) | ORAL | Status: DC
  Administered 2016-03-04 – 2016-03-05 (×3): 12.5 mg via ORAL
  Filled 2016-03-04 (×3): qty 1

## 2016-03-04 MED ORDER — AMIODARONE HCL 200 MG PO TABS
400.0000 mg | ORAL_TABLET | Freq: Two times a day (BID) | ORAL | Status: DC
  Administered 2016-03-04 – 2016-03-05 (×3): 400 mg via ORAL
  Filled 2016-03-04 (×3): qty 2

## 2016-03-04 NOTE — Progress Notes (Signed)
Patient ID: Max Rivas, male   DOB: 07-13-1950, 66 y.o.   MRN: [id number]  PROGRESS NOTE    Max Rivas  WUJ:[id number] DOB: November 28, 1950 DOA: 02/24/2016  PCP: Jyl Heinz, MD   Brief Narrative:  66 year old male with past medical history of ischemic cardiomyopathy (EF 25-30% by ECHO in 2012), recent hospitalization for acute decompensated CHF and atrial fibrillation on xarelto, PE and lung mass concerning for cancer. He presented to ED with shortness of breath at rest with exertion as well as leg swelling for 48 hours prior to this admission. Pt also had chest pain and associated productive cough, fever and chills.  D dimer was positive and CT angio chest was subsequently done and showed right pulmonary artery thrombus and possible lung mass and bilateral pleural effusion. Xarelto on discharge in 01/2016 was prescribed but pt never took it as he had to attend class in [place] before obtaining the medication.   Assessment & Plan:  Acute respiratory failure with hypoxia / Acute pulmonary embolism / Acute COPD exacerbation  - 2 D ECHO on this admission showed EF 25%, diffuse hypokinesis - Was on Lasix 80 mg IV Q 8 hours; cardio stopped today and started Demadex  - Stopped solumedrol 2/16; currently on prednisone 20 mg PO BID - Continue brovana and Pulmicort BID nebulizer  - Continue oxygen support via [place] to [place]p O2 sat above 90%  Right-sided pulmonary thrombus with right infrahilar mass, possible lung cancer  - Continue heparin drip for now - Per pulmonary, outpt follow up with Dr. Delton Coombes in few to several weeks and outpt PET scan. If all of this resolves then this mass possible post PE infarction, if nto then he needs biopsy. Avoid bronch due to sedation risk  Atrial fibrillation with RVR  - CHADS vasc score 2 - Stopped heparin drip today and usinf xarelto per cardio recommendations  - Continue metoprolol 12.5 mg daily  - Converted amiodarone to PO this am (400 mg PO BID started 2/21) -  Continue digoxin 0.125 mg daily  - Per cardio, eventually would favor DCCV attempt. However, would not do DCCV at this time with fresh PE. Would give time for PE to stabilize and then do DCCV in a month or so  Thrombocytopenia - Likely in the setting of heparin anticoagulation - Platelets 121 --> 129  Essential hypertension - Continue metoprolol 12.5 mg daily  - Continue losartan 12.5 mg daily   Acute on chronic systolic CHF, NYHA class IIIb symptoms / Ischemic cardiomyopathy  - Exacerbation likely triggered by ongoing atrial fibrillation and RV failure made worse by PE - Continue lasix 80 mg Q 8 hours per cardiology recommendations  - Continue metolazone (started 02/25/16) 2.5 mg given daily; last dose 2/18 - Continue milrinone drip  - COOK pending and if stable then per cardio can stop milrinone  - Continue spironolactone 12.5 mg daily, reduced from 25 mg due to soft BP - Continue Toprol XL 12.5 mg daily  - Continue losartan, 12.5 mg daily  - Continue digoxin 0.125 mg daily, level okay 2/19 - Replace potassium   Hypokalemia / Metabolic alkalosis - Due to lasix - Continue to supplement   Hyponatremia - Likely in the setting of acute lung process, diuretic  - Sodium 128-130  Dyslipidemia - Continue Lipitor and omega 3 supplementation   Chronic kidney disease stage 3 - Baseline Cr 1.55 about 1 month ago - Cr within baseline range   Elevated TSH - TSH 8.8  on this admission - Needs follow up in outpt setting    DVT prophylaxis: On xarelto  Code Status: DNR/DNI Family Communication: no family at the bedside this am Disposition Plan: possibly tomorrow if cleared by cardio   Consultants:   Cardio  PCCM  Procedures:   None  Antimicrobials:   None    Subjective: No overnight events.    Objective: Vitals:   03/04/16 0434 03/04/16 0728 03/04/16 0900 03/04/16 0902  BP: 103/63 110/61    Pulse: 87 76    Resp: 20 18    Temp: 97.5 F (36.4 C) 97.7 F (36.5 C)     TempSrc: Oral Oral    SpO2: 90% 94% 94% 96%  Weight: 99.8 kg (220 lb)     Height:        Intake/Output Summary (Last 24 hours) at 03/04/16 1100 Last data filed at 03/04/16 0800  Gross per 24 hour  Intake          1449.45 ml  Output             4325 ml  Net         -2875.55 ml   Filed Weights   03/02/16 0500 03/03/16 0500 03/04/16 0434  Weight: 105.5 kg (232 lb 8 oz) 100.7 kg (221 lb 14.4 oz) 99.8 kg (220 lb)    Examination:  General exam: No acute distress Respiratory system: no rhonchi, diminished breath sounds   Cardiovascular system: S1 & S2 heard, Rate controlled  Gastrointestinal system: (+) BS, non tender abdomen  Central nervous system: No focal deficits  Extremities: No tenderness, palpable pulses Skin: +2 LE pitting edema Psychiatry: normal mood, behavior   Data Reviewed: I have personally reviewed following labs and imaging studies  CBC:  Recent Labs Lab 02/29/16 0453 03/01/16 0450 03/02/16 0639 03/03/16 0440 03/04/16 0225  WBC 10.2 8.6 9.4 11.7* 11.5*  HGB 13.6 14.4 14.8 16.0 15.7  HCT 42.0 44.3 45.3 47.6 46.9  MCV 96.1 95.3 95.4 93.9 93.1  PLT 135* 118* 110* 129* 121*   Basic Metabolic Panel:  Recent Labs Lab 02/29/16 0453 03/01/16 0450 03/02/16 0639 03/03/16 0756 03/04/16 0225  NA 131* 127* 130* 128* 130*  K 4.4 4.9 4.8 4.4 4.6  CL 79* 76* 76* 76* 78*  CO2 41* 43* 41* 40* 41*  GLUCOSE 203* 139* 209* 263* 140*  BUN 30* 27* 32* 33* 31*  CREATININE 1.20 1.26* 1.47* 1.33* 1.35*  CALCIUM 8.8* 9.3 9.7 9.4 9.3   GFR: Estimated Creatinine Clearance: 67.4 mL/min (by C-G formula based on SCr of 1.35 mg/dL (H)). Liver Function Tests: No results for input(s): AST, ALT, ALKPHOS, BILITOT, PROT, ALBUMIN in the last 168 hours. No results for input(s): LIPASE, AMYLASE in the last 168 hours. No results for input(s): AMMONIA in the last 168 hours. Coagulation Profile: No results for input(s): INR, PROTIME in the last 168 hours. Cardiac  Enzymes: No results for input(s): CKTOTAL, CKMB, CKMBINDEX, TROPONINI in the last 168 hours. BNP (last 3 results) No results for input(s): PROBNP in the last 8760 hours. HbA1C: No results for input(s): HGBA1C in the last 72 hours. CBG: No results for input(s): GLUCAP in the last 168 hours. Lipid Profile: No results for input(s): CHOL, HDL, LDLCALC, TRIG, CHOLHDL, LDLDIRECT in the last 72 hours. Thyroid Function Tests: No results for input(s): TSH, T4TOTAL, FREET4, T3FREE, THYROIDAB in the last 72 hours. Anemia Panel: No results for input(s): VITAMINB12, FOLATE, FERRITIN, TIBC, IRON, RETICCTPCT in the last 72 hours. Urine analysis:  No results found for: COLORURINE, APPEARANCEUR, LABSPEC, PHURINE, GLUCOSEU, HGBUR, BILIRUBINUR, KETONESUR, PROTEINUR, UROBILINOGEN, NITRITE, LEUKOCYTESUR Sepsis Labs: @LABRCNTIP (procalcitonin:4,lacticidven:4)   Recent Results (from the past 240 hour(s))  MRSA PCR Screening     Status: None   Collection Time: 02/25/16  6:20 PM  Result Value Ref Range Status   MRSA by PCR NEGATIVE NEGATIVE Final      Radiology Studies:  Ct Angio Chest Pe W And/or Wo Contrast Result Date: 02/24/2016 Thrombosis in the distal right main pulmonary artery and extending into lower lobe branches. No right heart strain. I am suspicious that there is a mass in the right infrahilar region causing narrowing of the right lower lobe bronchus and associated postobstructive change. This mass may also be compressing the right pulmonary artery. Indeterminate 7 mm nodule in the right upper lobe. Indeterminate borderline enlarged hilar and mediastinal lymph nodes. Bilateral pleural effusions with basilar atelectasis. Right-sided pleural thickening and pleural calcifications. Upper abdominal ascites with probable hepatic cirrhosis. These results were called by telephone at the time of interpretation on 02/24/2016 at 3:14 am to Dr. Ross Marcus , who verbally acknowledged these results.  Electronically Signed   By: Burman Nieves M.D.   On: 02/24/2016 03:17   Dg Chest Port 1 View Result Date: 02/25/2016 The tip of the PICC line projects over the junction of the middle and distal thirds of the SVC. Electronically Signed   By: David  Sw[place] M.D.   On: 02/25/2016 10:26   Dg Chest Portable 1 View Cardiac enlargement with interstitial edema, bilateral pleural effusions, and basilar atelectasis, similar to the prior study. Electronically Signed   By: Burman Nieves M.D.   On: 02/24/2016 01:47    . arformoterol  15 mcg Nebulization BID  . aspirin EC  81 mg Oral Daily  . atorvastatin  20 mg Oral q1800  . budesonide  0.5 mg Inhalation BID  . digoxin  0.125 mg Oral Daily  . furosemide  80 mg Intravenous TID  . losartan  12.5 mg Oral QPM  . metoprolol succina  12.5 mg Oral Daily  . multivitamin with mi  1 tablet Oral Daily  . omega-3 acid ethyl   1 g Oral BID  . pantoprazole  40 mg Oral Daily  . potassium chloride  60 mEq Oral BID  . predniSONE  20 mg Oral BID WC  . spironolactone  12.5 mg Oral Daily    Continuous Infusions:    LOS: 9 days    Time spent: 15 minutes  Greater than 50% of the time spent on counseling and coordinating the care.   Manson Passey, MD [facility] Pager [phone]  If 7PM-7AM, please contact night-coverage [email or web address] Password [email or web address] 03/04/2016, 11:00 AM

## 2016-03-04 NOTE — Progress Notes (Signed)
Patient ID: Max Rivas, male   DOB: [birth year], 66 y.o.   MRN: [id number]    SUBJECTIVE: Co-ox low on 2/13, milrinone started.   Coox pending on milrinone 0.125 mcg/kg/min.  CVP 8-9  Feels good. Wants to go home.  Feels much better than admit.  Still some SOB with exertion. Less orthopneic.   Out 2.5 L and down 1 lb on po torsemide 60 mg daily.   BMET pending.    Scheduled Meds: . arformoterol  15 mcg Nebulization BID  . aspirin EC  81 mg Oral Daily  . atorvastatin  20 mg Oral q1800  . budesonide  0.5 mg Inhalation BID  . digoxin  0.125 mg Oral Daily  . losartan  12.5 mg Oral QPM  . metoprolol succinate  12.5 mg Oral Daily  . multivitamin with minerals  1 tablet Oral Daily  . omega-3 acid ethyl esters  1 g Oral BID  . pantoprazole  40 mg Oral Daily  . potassium chloride  40 mEq Oral Daily  . predniSONE  20 mg Oral BID WC  . rivaroxaban  15 mg Oral BID   Followed by  . [START ON 03/24/2016] rivaroxaban  20 mg Oral Q supper  . sodium chloride flush  10-40 mL Intracatheter Q12H  . spironolactone  12.5 mg Oral Daily  . torsemide  60 mg Oral Daily   Continuous Infusions: . amiodarone 30 mg/hr (03/04/16 0200)  . milrinone 0.125 mcg/kg/min (03/03/16 2100)   PRN Meds:.acetaminophen **OR** acetaminophen, levalbuterol, ondansetron **OR** ondansetron (ZOFRAN) IV, sodium chloride flush    Vitals:   03/04/16 0100 03/04/16 0200 03/04/16 0434 03/04/16 0728  BP:   103/63 110/61  Pulse: (!) 106 83 87 76  Resp: 18 17 20 18   Temp:   97.5 F (36.4 C) 97.7 F (36.5 C)  TempSrc:   Oral Oral  SpO2: 90% 93% 90% 94%  Weight:   220 lb (99.8 kg)   Height:        Intake/Output Summary (Last 24 hours) at 03/04/16 0835 Last data filed at 03/04/16 0729  Gross per 24 hour  Intake          1449.45 ml  Output             4325 ml  Net         -2875.55 ml    LABS: Basic Metabolic Panel:  Recent Labs  82/95/62 0756 03/04/16 0225  NA 128* 130*  K 4.4 4.6  CL 76* 78*  CO2 40* 41*    GLUCOSE 263* 140*  BUN 33* 31*  CREATININE 1.33* 1.35*  CALCIUM 9.4 9.3   Liver Function Tests: No results for input(s): AST, ALT, ALKPHOS, BILITOT, PROT, ALBUMIN in the last 72 hours. No results for input(s): LIPASE, AMYLASE in the last 72 hours. CBC:  Recent Labs  03/03/16 0440 03/04/16 0225  WBC 11.7* 11.5*  HGB 16.0 15.7  HCT 47.6 46.9  MCV 93.9 93.1  PLT 129* 121*   Cardiac Enzymes: No results for input(s): CKTOTAL, CKMB, CKMBINDEX, TROPONINI in the last 72 hours. BNP: Invalid input(s): POCBNP D-Dimer: No results for input(s): DDIMER in the last 72 hours. Hemoglobin A1C: No results for input(s): HGBA1C in the last 72 hours. Fasting Lipid Panel: No results for input(s): CHOL, HDL, LDLCALC, TRIG, CHOLHDL, LDLDIRECT in the last 72 hours. Thyroid Function Tests: No results for input(s): TSH, T4TOTAL, T3FREE, THYROIDAB in the last 72 hours.  Invalid input(s): FREET3 Anemia Panel: No results for input(s):  VITAMINB12, FOLATE, FERRITIN, TIBC, IRON, RETICCTPCT in the last 72 hours.  RADIOLOGY: Ct Soft Tissue Neck W Contrast  Result Date: 02/25/2016 CLINICAL DATA:  Lymphadenopathy EXAM: CT NECK WITH CONTRAST TECHNIQUE: Multidetector CT imaging of the neck was performed using the standard protocol following the bolus administration of intravenous contrast. CONTRAST:  24mL ISOVUE-300 IOPAMIDOL (ISOVUE-300) INJECTION 61% COMPARISON:  None. FINDINGS: Pharynx and larynx: The nasopharynx is clear. The oropharynx and hypopharynx are normal. The epiglottis, supraglottic larynx, glottis and subglottic larynx are normal. No retropharyngeal collection. The parapharyngeal spaces are preserved. The visible oral cavity and base of tongue are normal. Salivary glands: The parotid and submandibular glands are normal. No sialolithiasis or salivary ductal dilatation. Thyroid: Normal Lymph nodes: There is no enlarged or abnormal density cervical lymph node. Vascular: There is aortic atherosclerosis.  There is bilateral atherosclerotic plaque at the carotid bifurcations, right worse than left. Limited intracranial: Normal. Visualized orbits: Normal. Mastoids and visualized paranasal sinuses: Clear Skeleton: There are no lytic or blastic osseous lesions. No bony spinal canal stenosis. Upper chest: There is a medium-sized left pleural effusion. There is a large, partially calcified pleural plaque along the lateral right pleural surface. There is biapical emphysema. No pulmonary nodules or masses. Other: There is a a right-sided PICC line with tip below the field of view. IMPRESSION: 1. No cervical lymphadenopathy or mass. 2. Partially calcified right lateral pleural plaque, possibly reflecting prior asbestos exposure. 3. Medium-sized left pleural effusion. Electronically Signed   By: Deatra Robinson M.D.   On: 02/25/2016 22:19   Ct Angio Chest Pe W And/or Wo Contrast  Result Date: 02/24/2016 CLINICAL DATA:  Hypotension with positive D-dimer. EXAM: CT ANGIOGRAPHY CHEST WITH CONTRAST TECHNIQUE: Multidetector CT imaging of the chest was performed using the standard protocol during bolus administration of intravenous contrast. Multiplanar CT image reconstructions and MIPs were obtained to evaluate the vascular anatomy. CONTRAST:  100 mL Isovue 370 COMPARISON:  None. FINDINGS: Cardiovascular: Large filling defect beginning in the distal right main pulmonary artery and extending into the lower lobe branches. No flow is demonstrated in right lower lobe branches. Appearance is consistent with pulmonary arterial thrombus or embolus. Cardiac enlargement. RV to LV ratio is less than 1, suggesting no evidence of right heart strain. Reflux of contrast material into the hepatic veins is consistent with right heart failure. No pericardial effusion. Calcification of the thoracic aorta and coronary arteries. Mediastinum/Nodes: Scattered lymph nodes are present throughout the mediastinum and in both hilar regions. Right  paratracheal lymph nodes measure about 1.8 cm short axis dimension. Appearance is nonspecific. These may be reactive nodes although pathologic lymphadenopathy is not excluded. Lungs/Pleura: Diffuse right-sided pleural thickening with pleural calcifications. Bilateral pleural effusions. Basilar atelectasis bilaterally. Emphysematous changes in the lungs. Right upper lung nodule with mild spiculation measures 7 mm diameter. There is effacement and narrowing of the right lower lobe bronchus with postobstructive consolidation. Suspicion of a a right infrahilar mass measuring 3.4 cm diameter. This could also be causing narrowing of the right lower lobe pulmonary artery, resulting in the thrombosis. Upper Abdomen: Upper abdominal ascites is present. Enlarged lateral segment of the left lobe of the liver suggests cirrhosis. Diffuse soft tissue edema. Musculoskeletal: No chest wall abnormality. No acute or significant osseous findings. Review of the MIP images confirms the above findings. IMPRESSION: Thrombosis in the distal right main pulmonary artery and extending into lower lobe branches. No right heart strain. I am suspicious that there is a mass in the right infrahilar region causing  narrowing of the right lower lobe bronchus and associated postobstructive change. This mass may also be compressing the right pulmonary artery. Indeterminate 7 mm nodule in the right upper lobe. Indeterminate borderline enlarged hilar and mediastinal lymph nodes. Bilateral pleural effusions with basilar atelectasis. Right-sided pleural thickening and pleural calcifications. Upper abdominal ascites with probable hepatic cirrhosis. These results were called by telephone at the time of interpretation on 02/24/2016 at 3:14 am to Dr. Ross Marcus , who verbally acknowledged these results. Electronically Signed   By: Burman Nieves M.D.   On: 02/24/2016 03:17   Dg Chest Port 1 View  Result Date: 02/25/2016 CLINICAL DATA:  Status post  right-sided PICC line placement. Assess positioning of the tip. EXAM: PORTABLE CHEST 1 VIEW COMPARISON:  Chest x-ray of February 24 2016. FINDINGS: The PICC line tip projects over the junction of the middle and distal thirds of the SVC. No postprocedure pneumothorax is observed. There are bilateral pleural effusions which are stable. The cardiac silhouette is enlarged. The pulmonary vascularity is engorged. The ICD is in stable position where visualized. IMPRESSION: The tip of the PICC line projects over the junction of the middle and distal thirds of the SVC. Electronically Signed   By: David  [place]il[place] M.D.   On: 02/25/2016 10:26   Dg Chest Portable 1 View  Result Date: 02/24/2016 CLINICAL DATA:  Chest pain and shortness of breath.  Home oxygen. EXAM: PORTABLE CHEST 1 VIEW COMPARISON:  01/15/2016 FINDINGS: Cardiac pacemaker. Cardiac enlargement without significant pulmonary vascular congestion. Perihilar interstitial changes suggesting edema. Bilateral pleural effusions with basilar atelectasis, greater on the right. Appearances are similar to previous study. No pneumothorax. Mediastinal contours appear intact. IMPRESSION: Cardiac enlargement with interstitial edema, bilateral pleural effusions, and basilar atelectasis, similar to the prior study. Electronically Signed   By: Burman Nieves M.D.   On: 02/24/2016 01:47    PHYSICAL EXAM General: NAD, sitting on edge of bed.  Neck: JVP ~8 cm. No thyromegaly or thyroid nodule.  Lungs: Distant BS, end expiratory wheezes.  CV: Nondisplaced PMI.  Heart mildly tachy, irregular S1/S2, no S3/S4, no murmur.  2+ BLE edema to knees. No carotid bruit.  Normal pedal pulses.  Abdomen: Soft, NT, ND, no HSM. No bruits or masses. +BS  Neurologic: Alert and oriented x 3.  Psych: Normal affect. Extremities: No clubbing or cyanosis.   TELEMETRY: Reviewed, Afib 80-100s   ASSESSMENT AND PLAN: Patient has long history of ischemic cardiomyopathy, EF 25-30% by echo 2/12  (consistent with past).  He remains in atrial fibrillation with controlled rate.  He has a substantial PE.  He also has a lung mass concerning for cancer and started back smoking a few days ago.  He came to the hospital because of dyspnea with minimal exertion and swelling.   1. Acute on chronic systolic CHF: Echo this admission with EF 25-30%, mild LV dilation, moderate MR, PASP 39 mmHg, RV moderately dilated/moderately dysfunction.  Ischemic cardiomyopathy, has MDT ICD.  NYHA class IIIb symptoms, marked volume overload on exam at admission.  Exacerbation likely triggered by ongoing atrial fibrillation and RV failure made worse by PE.  Still has marked lower extremity edema, but weight down 40 lbs overall and CVP 7-8 this morning => suspect venous insufficiency in addition to CHF.  Creatinine starting to rise.  - Continue torsemide 60 mg daily. Creatinine stable.  - Coox pending. If stable will stop milrinone and keep PICC to follow coox one more day.    - Continue spironolactone 12.5  daily with BP on softer side.  - Continue losartan 12.5 mg QHS with soft BP.    - Tolerating Toprol XL 12.5 daily with low output.  - Continue digoxin 0.125, level ok 03/02/16.   2. PE: New diagnosis this admission noted in distal right main.  Trigger may be right infrahilar mass/?lung cancer.   - No procedure planned this admission, start back on Xarelto.   3. Atrial fibrillation: Persistent since 1/18 admission.  Rate is reasonable controlled on digoxin and Toprol XL (HR 90s-100s). Was not able to get Xarelto from the [place], unfortunately it appears that there is an hour long class that has to be attended before Xarelto will be dispensed, and he had not had time to go to the class. In the interim, he had a PE.   - Convert amiodarone to po.   - Toprol XL 12.5 daily.   - Continue Xarelto 15 mg BID until 03/24/16, when he will start 20 mg daily. [facility]  - Eventually  would favor DCCV attempt. However, would not do DCCV at this time with fresh PE.  Would give some time for PE to stabilize/resolve (possible DCCV in a month), but will need to be on blood thinner.  4. Lung mass: Right infrahilar.  Possible lung cancer, pulmonary following, will need bronchoscopy/EBUS when stable => not as inpatient, per pulmonary, will have him come back as outpatient.  - Aware of need to stop smoking.  5. Cirrhosis: CT concerning for possible hepatic cirrhosis.  6. CKD: Marked diuresis again and BMET pending.  7. Hyponatremia:  - Mild, and stable. Continue to follow. Restrict free water.   8. COPD exacerbation: Transitioned to prednisone. No change to current plan.  9. Pleural calcification.  - Follow up with pulmonary arranged.   Tolerating milrinone wean.  Coox pending. If stable will stop milrinone. Possibly home tomorrow. [facility] he can receive X[facility]to on discharge due to large PE and Atrial fib.   Graciella Freer, PA-C  03/04/2016 8:35 AM  Advanced Heart Failure Team Pager [phone] (M-F; 7a - 4p)  Please contact [facility] for night-coverage after hours (4p -7a ) and weekends on [email or web address]   Patient seen with PA, agree with the above note.  Co-ox 61%.  Will stop milrinone today, repeat co-ox in am.  Stop IV amiodarone, transition to po.  Increase losartan to 12.5 mg bid.  Creatinine stable.  Possibly home tomorrow, DCCV in 3-4 weeks on Xarelto.   Marca Ancona 03/04/2016 9:24 AM

## 2016-03-05 LAB — BASIC METABOLIC PANEL
Anion gap: 9 (ref 5–15)
BUN: 35 mg/dL — ABNORMAL HIGH (ref 6–20)
CHLORIDE: 80 mmol/L — AB (ref 101–111)
CO2: 39 mmol/L — ABNORMAL HIGH (ref 22–32)
CREATININE: 1.37 mg/dL — AB (ref 0.61–1.24)
Calcium: 9.4 mg/dL (ref 8.9–10.3)
GFR calc Af Amer: >60 mL/min (ref >60)
GFR calc non Af Amer: 53 mL/min — ABNORMAL LOW (ref >60)
Glucose, Bld: 109 mg/dL — ABNORMAL HIGH (ref 65–99)
POTASSIUM: 4.7 mmol/L (ref 3.5–5.1)
SODIUM: 128 mmol/L — AB (ref 135–145)

## 2016-03-05 LAB — CBC
HCT: 48 % (ref 39.0–52.0)
HEMOGLOBIN: 16.1 g/dL (ref 13.0–17.0)
MCH: 31.3 pg (ref 26.0–34.0)
MCHC: 33.5 g/dL (ref 30.0–36.0)
MCV: 93.2 fL (ref 78.0–100.0)
Platelets: 109 10*3/uL — ABNORMAL LOW (ref 150–400)
RBC: 5.15 MIL/uL (ref 4.22–5.81)
RDW: 15.6 % — ABNORMAL HIGH (ref 11.5–15.5)
WBC: 12.6 10*3/uL — AB (ref 4.0–10.5)

## 2016-03-05 LAB — COOXEMETRY PANEL
CARBOXYHEMOGLOBIN: 1.3 % (ref 0.5–1.5)
METHEMOGLOBIN: 0.9 % (ref 0.0–1.5)
O2 Saturation: 70.3 %
Total hemoglobin: 16.5 g/dL — ABNORMAL HIGH (ref 12.0–16.0)

## 2016-03-05 MED ORDER — TORSEMIDE 20 MG PO TABS: 60.0000 mg | ORAL_TABLET | Freq: Every day | ORAL | 6 refills | Status: DC

## 2016-03-05 MED ORDER — ACETAMINOPHEN 325 MG PO TABS: 650.0000 mg | ORAL_TABLET | Freq: Four times a day (QID) | ORAL | 0 refills | Status: DC | PRN

## 2016-03-05 MED ORDER — ARFORMOTEROL TARTRATE 15 MCG/2ML IN NEBU: 15.0000 ug | INHALATION_SOLUTION | Freq: Two times a day (BID) | RESPIRATORY_TRACT | 0 refills | Status: DC

## 2016-03-05 MED ORDER — METOPROLOL SUCCINATE ER 25 MG PO TB24: 12.5000 mg | ORAL_TABLET | Freq: Every day | ORAL | 6 refills | Status: DC

## 2016-03-05 MED ORDER — ALBUTEROL SULFATE HFA 108 (90 BASE) MCG/ACT IN AERS: 2.0000 | INHALATION_SPRAY | Freq: Four times a day (QID) | RESPIRATORY_TRACT | 3 refills | Status: DC | PRN

## 2016-03-05 MED ORDER — SPIRONOLACTONE 25 MG PO TABS
25.0000 mg | ORAL_TABLET | Freq: Every day | ORAL | Status: DC
  Administered 2016-03-05: 25 mg via ORAL
  Filled 2016-03-05: qty 1

## 2016-03-05 MED ORDER — LOSARTAN POTASSIUM 25 MG PO TABS: 12.5000 mg | ORAL_TABLET | Freq: Two times a day (BID) | ORAL | 6 refills | Status: DC

## 2016-03-05 MED ORDER — OMEGA-3-ACID ETHYL ESTERS 1 G PO CAPS: 1.0000 g | ORAL_CAPSULE | Freq: Two times a day (BID) | ORAL | 0 refills | Status: DC

## 2016-03-05 MED ORDER — AMIODARONE HCL 200 MG PO TABS: ORAL_TABLET | ORAL | 3 refills | Status: DC

## 2016-03-05 MED ORDER — SPIRONOLACTONE 25 MG PO TABS: 25.0000 mg | ORAL_TABLET | Freq: Every day | ORAL | 6 refills | Status: DC

## 2016-03-05 MED ORDER — DIGOXIN 125 MCG PO TABS: 0.1250 mg | ORAL_TABLET | Freq: Every day | ORAL | 6 refills | Status: DC

## 2016-03-05 MED ORDER — RIVAROXABAN 20 MG PO TABS: 20.0000 mg | ORAL_TABLET | Freq: Every day | ORAL | 6 refills | Status: DC

## 2016-03-05 MED ORDER — PREDNISONE 5 MG PO TABS
50.0000 mg | ORAL_TABLET | Freq: Two times a day (BID) | ORAL | 0 refills | Status: DC
Start: 2016-03-05 — End: 2017-03-24

## 2016-03-05 MED ORDER — RIVAROXABAN (XARELTO) VTE STARTER PACK (15 & 20 MG): ORAL_TABLET | ORAL | 0 refills | Status: DC

## 2016-03-05 MED ORDER — ATORVASTATIN CALCIUM 20 MG PO TABS: 20.0000 mg | ORAL_TABLET | Freq: Every day | ORAL | 0 refills | Status: DC

## 2016-03-05 MED ORDER — POTASSIUM CHLORIDE CRYS ER 20 MEQ PO TBCR
20.0000 meq | EXTENDED_RELEASE_TABLET | Freq: Every day | ORAL | Status: DC
Start: 2016-03-05 — End: 2016-03-05
  Administered 2016-03-05: 20 meq via ORAL
  Filled 2016-03-05: qty 1

## 2016-03-05 MED ORDER — LEVALBUTEROL HCL 0.63 MG/3ML IN NEBU: 0.6300 mg | INHALATION_SOLUTION | Freq: Four times a day (QID) | RESPIRATORY_TRACT | 0 refills | Status: DC | PRN

## 2016-03-05 MED FILL — XARELTO STARTER PACK: 15 & 20 | 28 days supply | Qty: 51 | Fill #0

## 2016-03-05 NOTE — Progress Notes (Signed)
Patient ID: Max Rivas, male   DOB: 02-Aug-1950, 66 y.o.   MRN: [id number]    SUBJECTIVE: Co-ox low on 2/13, milrinone started. Now successfully weaned off.   Coox 70.3% off milrinone. CVP 7-8. Creatinine stable.   Feeling great today.  Legs still swollen but breathing much improved. Wants to go home.  Says he will pick up some of his medicines from [place], and talk to the [place] about the others.   Out 3.6L and down another pound. Down 40 lbs this admission.   Scheduled Meds: . amiodarone  400 mg Oral BID  . arformoterol  15 mcg Nebulization BID  . atorvastatin  20 mg Oral q1800  . budesonide  0.5 mg Inhalation BID  . digoxin  0.125 mg Oral Daily  . losartan  12.5 mg Oral BID  . metoprolol succinate  12.5 mg Oral Daily  . multivitamin with minerals  1 tablet Oral Daily  . omega-3 acid ethyl esters  1 g Oral BID  . pantoprazole  40 mg Oral Daily  . potassium chloride  40 mEq Oral Daily  . predniSONE  20 mg Oral BID WC  . rivaroxaban  15 mg Oral BID   Followed by  . [START ON 03/24/2016] rivaroxaban  20 mg Oral Q supper  . sodium chloride flush  10-40 mL Intracatheter Q12H  . spironolactone  12.5 mg Oral Daily  . torsemide  60 mg Oral Daily   Continuous Infusions:  PRN Meds:.acetaminophen **OR** acetaminophen, levalbuterol, ondansetron **OR** ondansetron (ZOFRAN) IV, sodium chloride flush    Vitals:   03/05/16 0025 03/05/16 0027 03/05/16 0355 03/05/16 0821  BP: 104/66  (!) 105/55 (!) 100/51  Pulse: (!) 58 97 76 77  Resp: 14  20 20   Temp: 97.6 F (36.4 C)  97.7 F (36.5 C) 97.4 F (36.3 C)  TempSrc: Axillary  Oral Oral  SpO2: 90%  91% 96%  Weight:   219 lb 8 oz (99.6 kg)   Height:        Intake/Output Summary (Last 24 hours) at 03/05/16 7846 Last data filed at 03/05/16 0325  Gross per 24 hour  Intake             1040 ml  Output             4600 ml  Net            -3560 ml    LABS: Basic Metabolic Panel:  Recent Labs  96/29/52 0225 03/05/16 0500  NA 130*  128*  K 4.6 4.7  CL 78* 80*  CO2 41* 39*  GLUCOSE 140* 109*  BUN 31* 35*  CREATININE 1.35* 1.37*  CALCIUM 9.3 9.4   Liver Function Tests: No results for input(s): AST, ALT, ALKPHOS, BILITOT, PROT, ALBUMIN in the last 72 hours. No results for input(s): LIPASE, AMYLASE in the last 72 hours. CBC:  Recent Labs  03/04/16 0225 03/05/16 0500  WBC 11.5* 12.6*  HGB 15.7 16.1  HCT 46.9 48.0  MCV 93.1 93.2  PLT 121* 109*   Cardiac Enzymes: No results for input(s): CKTOTAL, CKMB, CKMBINDEX, TROPONINI in the last 72 hours. BNP: Invalid input(s): POCBNP D-Dimer: No results for input(s): DDIMER in the last 72 hours. Hemoglobin A1C: No results for input(s): HGBA1C in the last 72 hours. Fasting Lipid Panel: No results for input(s): CHOL, HDL, LDLCALC, TRIG, CHOLHDL, LDLDIRECT in the last 72 hours. Thyroid Function Tests: No results for input(s): TSH, T4TOTAL, T3FREE, THYROIDAB in the last 72 hours.  Invalid input(s): FREET3 Anemia Panel: No results for input(s): VITAMINB12, FOLATE, FERRITIN, TIBC, IRON, RETICCTPCT in the last 72 hours.  RADIOLOGY: Ct Soft Tissue Neck W Contrast  Result Date: 02/25/2016 CLINICAL DATA:  Lymphadenopathy EXAM: CT NECK WITH CONTRAST TECHNIQUE: Multidetector CT imaging of the neck was performed using the standard protocol following the bolus administration of intravenous contrast. CONTRAST:  55mL ISOVUE-300 IOPAMIDOL (ISOVUE-300) INJECTION 61% COMPARISON:  None. FINDINGS: Pharynx and larynx: The nasopharynx is clear. The oropharynx and hypopharynx are normal. The epiglottis, supraglottic larynx, glottis and subglottic larynx are normal. No retropharyngeal collection. The parapharyngeal spaces are preserved. The visible oral cavity and base of tongue are normal. Salivary glands: The parotid and submandibular glands are normal. No sialolithiasis or salivary ductal dilatation. Thyroid: Normal Lymph nodes: There is no enlarged or abnormal density cervical lymph  node. Vascular: There is aortic atherosclerosis. There is bilateral atherosclerotic plaque at the carotid bifurcations, right worse than left. Limited intracranial: Normal. Visualized orbits: Normal. Mastoids and visualized paranasal sinuses: Clear Skeleton: There are no lytic or blastic osseous lesions. No bony spinal canal stenosis. Upper chest: There is a medium-sized left pleural effusion. There is a large, partially calcified pleural plaque along the lateral right pleural surface. There is biapical emphysema. No pulmonary nodules or masses. Other: There is a a right-sided PICC line with tip below the field of view. IMPRESSION: 1. No cervical lymphadenopathy or mass. 2. Partially calcified right lateral pleural plaque, possibly reflecting prior asbestos exposure. 3. Medium-sized left pleural effusion. Electronically Signed   By: Deatra Robinson M.D.   On: 02/25/2016 22:19   Ct Angio Chest Pe W And/or Wo Contrast  Result Date: 02/24/2016 CLINICAL DATA:  Hypotension with positive D-dimer. EXAM: CT ANGIOGRAPHY CHEST WITH CONTRAST TECHNIQUE: Multidetector CT imaging of the chest was performed using the standard protocol during bolus administration of intravenous contrast. Multiplanar CT image reconstructions and MIPs were obtained to evaluate the vascular anatomy. CONTRAST:  100 mL Isovue 370 COMPARISON:  None. FINDINGS: Cardiovascular: Large filling defect beginning in the distal right main pulmonary artery and extending into the lower lobe branches. No flow is demonstrated in right lower lobe branches. Appearance is consistent with pulmonary arterial thrombus or embolus. Cardiac enlargement. RV to LV ratio is less than 1, suggesting no evidence of right heart strain. Reflux of contrast material into the hepatic veins is consistent with right heart failure. No pericardial effusion. Calcification of the thoracic aorta and coronary arteries. Mediastinum/Nodes: Scattered lymph nodes are present throughout the  mediastinum and in both hilar regions. Right paratracheal lymph nodes measure about 1.8 cm short axis dimension. Appearance is nonspecific. These may be reactive nodes although pathologic lymphadenopathy is not excluded. Lungs/Pleura: Diffuse right-sided pleural thickening with pleural calcifications. Bilateral pleural effusions. Basilar atelectasis bilaterally. Emphysematous changes in the lungs. Right upper lung nodule with mild spiculation measures 7 mm diameter. There is effacement and narrowing of the right lower lobe bronchus with postobstructive consolidation. Suspicion of a a right infrahilar mass measuring 3.4 cm diameter. This could also be causing narrowing of the right lower lobe pulmonary artery, resulting in the thrombosis. Upper Abdomen: Upper abdominal ascites is present. Enlarged lateral segment of the left lobe of the liver suggests cirrhosis. Diffuse soft tissue edema. Musculoskeletal: No chest wall abnormality. No acute or significant osseous findings. Review of the MIP images confirms the above findings. IMPRESSION: Thrombosis in the distal right main pulmonary artery and extending into lower lobe branches. No right heart strain. I am suspicious that there  is a mass in the right infrahilar region causing narrowing of the right lower lobe bronchus and associated postobstructive change. This mass may also be compressing the right pulmonary artery. Indeterminate 7 mm nodule in the right upper lobe. Indeterminate borderline enlarged hilar and mediastinal lymph nodes. Bilateral pleural effusions with basilar atelectasis. Right-sided pleural thickening and pleural calcifications. Upper abdominal ascites with probable hepatic cirrhosis. These results were called by telephone at the time of interpretation on 02/24/2016 at 3:14 am to Dr. Ross Marcus , who verbally acknowledged these results. Electronically Signed   By: Burman Nieves M.D.   On: 02/24/2016 03:17   Dg Chest Port 1 View  Result  Date: 02/25/2016 CLINICAL DATA:  Status post right-sided PICC line placement. Assess positioning of the tip. EXAM: PORTABLE CHEST 1 VIEW COMPARISON:  Chest x-ray of February 24 2016. FINDINGS: The PICC line tip projects over the junction of the middle and distal thirds of the SVC. No postprocedure pneumothorax is observed. There are bilateral pleural effusions which are stable. The cardiac silhouette is enlarged. The pulmonary vascularity is engorged. The ICD is in stable position where visualized. IMPRESSION: The tip of the PICC line projects over the junction of the middle and distal thirds of the SVC. Electronically Signed   By: David  [place] M.D.   On: 02/25/2016 10:26   Dg Chest Portable 1 View  Result Date: 02/24/2016 CLINICAL DATA:  Chest pain and shortness of breath.  Home oxygen. EXAM: PORTABLE CHEST 1 VIEW COMPARISON:  01/15/2016 FINDINGS: Cardiac pacemaker. Cardiac enlargement without significant pulmonary vascular congestion. Perihilar interstitial changes suggesting edema. Bilateral pleural effusions with basilar atelectasis, greater on the right. Appearances are similar to previous study. No pneumothorax. Mediastinal contours appear intact. IMPRESSION: Cardiac enlargement with interstitial edema, bilateral pleural effusions, and basilar atelectasis, similar to the prior study. Electronically Signed   By: Burman Nieves M.D.   On: 02/24/2016 01:47    PHYSICAL EXAM General: NAD, sitting on edge of bed.  Neck: JVP ~8 cm. No thyromegaly or thyroid nodule.  Lungs: Distant BS, end expiratory wheezes.  CV: Nondisplaced PMI.  Heart mildly tachy, irregular S1/S2, no S3/S4, no murmur.  2+ BLE edema to knees. No carotid bruit.  Normal pedal pulses.  Abdomen: Soft, NT, ND, no HSM. No bruits or masses. +BS  Neurologic: Alert and oriented x 3.  Psych: Normal affect. Extremities: No clubbing or cyanosis.   TELEMETRY: Reviewed, Afib 80-100s   ASSESSMENT AND PLAN: Patient has long history of  ischemic cardiomyopathy, EF 25-30% by echo 2/12 (consistent with past).  He remains in atrial fibrillation with controlled rate.  He has a substantial PE.  He also has a lung mass concerning for cancer and started back smoking a few days ago.  He came to the hospital because of dyspnea with minimal exertion and swelling.   1. Acute on chronic systolic CHF: Echo this admission with EF 25-30%, mild LV dilation, moderate MR, PASP 39 mmHg, RV moderately dilated/moderately dysfunction.  Ischemic cardiomyopathy, has MDT ICD.  NYHA class IIIb symptoms, marked volume overload on exam at admission.  Exacerbation likely triggered by ongoing atrial fibrillation and RV failure made worse by PE.  - Volume status stable despite venous insufficiency/peripheral edema - Continue torsemide 60 mg daily for home.  - Coox stable off milrinone. Will remove PICC for home.    - Increase spironolactone to 25 mg daily.  - Can stop K with increase in spironolactone.  - Continue losartan 12.5 mg BID     -  Tolerating Toprol XL 12.5 daily with low output.  - Continue digoxin 0.125, level ok 03/02/16.   2. PE: New diagnosis this admission noted in distal right main.  Trigger may be right infrahilar mass/?lung cancer.   - No procedure planned this admission. He is back on Xarelto.  Sees pulmonary 03/24/16 3. Atrial fibrillation: Persistent since 1/18 admission.  Rate is reasonable controlled on digoxin and Toprol XL (HR 90s-100s). Was not able to get Xarelto from the [place], unfortunately it appears that there is an hour long class that has to be attended before Xarelto will be dispensed, and he had not had time to go to the class. In the interim, he had a PE.   - Convert amiodarone po with taper as below.    - Toprol XL 12.5 daily.   - Continue Xarelto 15 mg BID until 03/24/16, when he will start 20 mg daily. [facility]  - Eventually would favor DCCV attempt. However, would not do  DCCV at this time with fresh PE.  Would give some time for PE to stabilize/resolve (possible DCCV in a month), but will need to be on blood thinner.  4. Lung mass: Right infrahilar.  Possible lung cancer, pulmonary following, will need bronchoscopy/EBUS when stable => not as inpatient, per pulmonary, will have him come back as outpatient.  - Aware of need to stop smoking.  - Will see pulmonary as outpatient.  5. Cirrhosis: CT concerning for possible hepatic cirrhosis. No change to current plan.  6. CKD III - Stable on po diuretics.  7. Hyponatremia:  - Stable, but remains low. Encouraged to restrict free water.  8. COPD exacerbation: He is on home oxygen.  Transitioned to prednisone. No change to current plan.  9. Pleural calcification.  - Follow up with pulmonary arranged.   Stable off milrinone. OK for home today from HF standpoint  Cardiac meds for home Amiodarone 400 mg BID x 6 more days, then 200 mg BID x 7 days, then 200 mg daily. Digoxin 0.125 mg daily Losartan 12.5 mg BID Spironolactone 25 mg daily Toprol XL 12.5 mg daily Xarelto 15 mg BID until 03/24/16. Then start Xarelto 20 mg daily.  Torsemide 60 mg daily Atorvastatin 20 mg daily  Will need HF follow up in next couple of weeks.  Will plan BMET next week and follow up shortly after. Will need DCCV once on Xarelto for 3-4 weeks.  Will order [facility] nurse and Child psychotherapist.   Spoke with case manager and she is working with him concerning his medications.   Graciella Freer, PA-C  03/05/2016 8:22 AM  Advanced Heart Failure Team Pager [phone] (M-F; 7a - 4p)  Please contact [facility] for night-coverage after hours (4p -7a ) and weekends on [email or web address]  Patient seen with PA, agree with the above note.  Stable today with CVP 7 and co-ox 70% off milrinone.  Will increase spironolactone to 25 mg daily.  Ready for discharge with the above plan.  Will need close followup. DCCV in about 3 wks.   Marca Ancona 03/05/2016 8:57 AM

## 2016-03-05 NOTE — Progress Notes (Signed)
SATURATION QUALIFICATIONS: (This note is used to comply with regulatory documentation for home oxygen)  Patient Saturations on Room Air at Rest = 85%  Patient Saturations on Room Air while ambulating = 80%  Patient Saturations on 4  Liters of oxygen while Ambulating = 92%  Please briefly explain why patient needs home oxygen:

## 2016-03-05 NOTE — Discharge Instructions (Addendum)
Heart Failure °Heart failure is a condition in which the heart has trouble pumping blood because it has become weak or stiff. This means that the heart does not pump blood efficiently for the body to work well. For some people with heart failure, fluid may back up into the lungs and there may be swelling (edema) in the lower legs. Heart failure is usually a long-term (chronic) condition. It is important for you to take good care of yourself and follow the treatment plan from your health care provider. °What are the causes? °This condition is caused by some health problems, including: °· High blood pressure (hypertension). Hypertension causes the heart muscle to work harder than normal. High blood pressure eventually causes the heart to become stiff and weak. °· Coronary artery disease (CAD). CAD is the buildup of cholesterol and fat (plaques) in the arteries of the heart. °· Heart attack (myocardial infarction). Injured tissue, which is caused by the heart attack, does not contract as well and the heart's ability to pump blood is weakened. °· Abnormal heart valves. When the heart valves do not open and close properly, the heart muscle must pump harder to keep the blood flowing. °· Heart muscle disease (cardiomyopathy or myocarditis). Heart muscle disease is damage to the heart muscle from a variety of causes, such as drug or alcohol abuse, infections, or unknown causes. These can increase the risk of heart failure. °· Lung disease. When the lungs do not work properly, the heart must work harder. ° °What increases the risk? °Risk of heart failure increases as a person ages. This condition is also more likely to develop in people who: °· Are overweight. °· Are male. °· Smoke or chew tobacco. °· Abuse alcohol or illegal drugs. °· Have taken medicines that can damage the heart, such as chemotherapy drugs. °· Have diabetes. °? High blood sugar (glucose) is associated with high fat (lipid) levels in the blood. °? Diabetes  can also damage tiny blood vessels that carry nutrients to the heart muscle. °· Have abnormal heart rhythms. °· Have thyroid problems. °· Have low blood counts (anemia). ° °What are the signs or symptoms? °Symptoms of this condition include: °· Shortness of breath with activity, such as when climbing stairs. °· Persistent cough. °· Swelling of the feet, ankles, legs, or abdomen. °· Unexplained weight gain. °· Difficulty breathing when lying flat (orthopnea). °· Waking from sleep because of the need to sit up and get more air. °· Rapid heartbeat. °· Fatigue and loss of energy. °· Feeling light-headed, dizzy, or close to fainting. °· Loss of appetite. °· Nausea. °· Increased urination during the night (nocturia). °· Confusion. ° °How is this diagnosed? °This condition is diagnosed based on: °· Medical history, symptoms, and a physical exam. °· Diagnostic tests, which may include: °? Echocardiogram. °? Electrocardiogram (ECG). °? Chest X-ray. °? Blood tests. °? Exercise stress test. °? Radionuclide scans. °? Cardiac catheterization and angiogram. ° °How is this treated? °Treatment for this condition is aimed at managing the symptoms of heart failure. Medicines, behavioral changes, or other treatments may be necessary to treat heart failure. °Medicines °These may include: °· Angiotensin-converting enzyme (ACE) inhibitors. This type of medicine blocks the effects of a blood protein called angiotensin-converting enzyme. ACE inhibitors relax (dilate) the blood vessels and help to lower blood pressure. °· Angiotensin receptor blockers (ARBs). This type of medicine blocks the actions of a blood protein called angiotensin. ARBs dilate the blood vessels and help to lower blood pressure. °· Water   pills (diuretics). Diuretics cause the kidneys to remove salt and water from the blood. The extra fluid is removed through urination, leaving a lower volume of blood that the heart has to pump. °· Beta blockers. These improve heart  muscle strength and they prevent the heart from beating too quickly. °· Digoxin. This increases the force of the heartbeat. ° °Healthy behavior changes °These may include: °· Reaching and maintaining a healthy weight. °· Stopping smoking or chewing tobacco. °· Eating heart-healthy foods. °· Limiting or avoiding alcohol. °· Stopping use of street drugs (illegal drugs). °· Physical activity. ° °Other treatments °These may include: °· Surgery to open blocked coronary arteries or repair damaged heart valves. °· Placement of a biventricular pacemaker to improve heart muscle function (cardiac resynchronization therapy). This device paces both the right ventricle and left ventricle. °· Placement of a device to treat serious abnormal heart rhythms (implantable cardioverter defibrillator, or ICD). °· Placement of a device to improve the pumping ability of the heart (left ventricular assist device, or LVAD). °· Heart transplant. This can cure heart failure, and it is considered for certain patients who do not improve with other therapies. ° °Follow these instructions at home: °Medicines °· Take over-the-counter and prescription medicines only as told by your health care provider. Medicines are important in reducing the workload of your heart, slowing the progression of heart failure, and improving your symptoms. °? Do not stop taking your medicine unless your health care provider told you to do that. °? Do not skip any dose of medicine. °? Refill your prescriptions before you run out of medicine. You need your medicines every day. °Eating and drinking ° °· Eat heart-healthy foods. Talk with a dietitian to make an eating plan that is right for you. °? Choose foods that contain no trans fat and are low in saturated fat and cholesterol. Healthy choices include fresh or frozen fruits and vegetables, fish, lean meats, legumes, fat-free or low-fat dairy products, and whole-grain or high-fiber foods. °? Limit salt (sodium) if  directed by your health care provider. Sodium restriction may reduce symptoms of heart failure. Ask a dietitian to recommend heart-healthy seasonings. °? Use healthy cooking methods instead of frying. Healthy methods include roasting, grilling, broiling, baking, poaching, steaming, and stir-frying. °· Limit your fluid intake if directed by your health care provider. Fluid restriction may reduce symptoms of heart failure. °Lifestyle °· Stop smoking or using chewing tobacco. Nicotine and tobacco can damage your heart and your blood vessels. Do not use nicotine gum or patches before talking to your health care provider. °· Limit alcohol intake to no more than 1 drink per day for non-pregnant women and 2 drinks per day for men. One drink equals 12 oz of beer, 5 oz of wine, or 1½ oz of hard liquor. °? Drinking more than that is harmful to your heart. Tell your health care provider if you drink alcohol several times a week. °? Talk with your health care provider about whether any level of alcohol use is safe for you. °? If your heart has already been damaged by alcohol or you have severe heart failure, drinking alcohol should be stopped completely. °· Stop use of illegal drugs. °· Lose weight if directed by your health care provider. Weight loss may reduce symptoms of heart failure. °· Do moderate physical activity if directed by your health care provider. People who are elderly and people with severe heart failure should consult with a health care provider for physical activity recommendations. °  Monitor important information °· Weigh yourself every day. Keeping track of your weight daily helps you to notice excess fluid sooner. °? Weigh yourself every morning after you urinate and before you eat breakfast. °? Wear the same amount of clothing each time you weigh yourself. °? Record your daily weight. Provide your health care provider with your weight record. °· Monitor and record your blood pressure as told by your health  care provider. °· Check your pulse as told by your health care provider. °Dealing with extreme temperatures °· If the weather is extremely hot: °? Avoid vigorous physical activity. °? Use air conditioning or fans or seek a cooler location. °? Avoid caffeine and alcohol. °? Wear loose-fitting, lightweight, and light-colored clothing. °· If the weather is extremely cold: °? Avoid vigorous physical activity. °? Layer your clothes. °? Wear mittens or gloves, a hat, and a scarf when you go outside. °? Avoid alcohol. °General instructions °· Manage other health conditions such as hypertension, diabetes, thyroid disease, or abnormal heart rhythms as told by your health care provider. °· Learn to manage stress. If you need help to do this, ask your health care provider. °· Plan rest periods when fatigued. °· Get ongoing education and support as needed. °· Participate in or seek rehabilitation as needed to maintain or improve independence and quality of life. °· Stay up to date with immunizations. Keeping current on pneumococcal and influenza immunizations is especially important to prevent respiratory infections. °· Keep all follow-up visits as told by your health care provider. This is important. °Contact a health care provider if: °· You have a rapid weight gain. °· You have increasing shortness of breath that is unusual for you. °· You are unable to participate in your usual physical activities. °· You tire easily. °· You cough more than normal, especially with physical activity. °· You have any swelling or more swelling in areas such as your hands, feet, ankles, or abdomen. °· You are unable to sleep because it is hard to breathe. °· You feel like your heart is beating quickly (palpitations). °· You become dizzy or light-headed when you stand up. °Get help right away if: °· You have difficulty breathing. °· You notice or your family notices a change in your awareness, such as having trouble staying awake or having  difficulty with concentration. °· You have pain or discomfort in your chest. °· You have an episode of fainting (syncope). °This information is not intended to replace advice given to you by your health care provider. Make sure you discuss any questions you have with your health care provider. °Document Released: 12/29/2004 Document Revised: 09/03/2015 Document Reviewed: 07/24/2015 °Elsevier Interactive Patient Education © 2017 [employer] ° °

## 2016-03-05 NOTE — Progress Notes (Signed)
Discharge instructions reviewed with pt. Prescriptions given to pt. Pt has no questions at this time. IV d/c. Pt waiting on roommate to go home.

## 2016-03-05 NOTE — Progress Notes (Signed)
Heart Failure Navigator Consult Note  Presentation: Max Rivas is a 66 y.o. male with history of CAD status post stenting, recently diagnosed atrial fibrillation admitted last month for decompensated CHF presents to the ER because of worsening shortness of breath increasing peripheral edema. Patient states that over the last 2 days patient has been having increasing shortness of breath even at rest unable to lie flat with chest pain off and on the left side. Pain last few minutes each time and improves on resting. Sharp shooting pain. Denies any associated productive cough fever or chills. On exam patient has significant peripheral edema extending up to the abdomen. 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 Patient states he was prescribed xarelto but still has not taken the medication since he has to attend a class at [place] before obtaining the medication. Patient states he was otherwise compliant with Lasix.  Past Medical History:  Diagnosis Date  . Arthritis    "elbows, knees" (02/24/2016)  . CHF (congestive heart failure) (HCC)   . COPD (chronic obstructive pulmonary disease) (HCC)   . Coronary artery disease   . GERD (gastroesophageal reflux disease)   . High cholesterol   . Myocardial infarction ~ 1995 X 2;1998; 2000; 2004  . On home oxygen therapy    "2L w/activity" (02/24/2016)  . Pacemaker   . Pulmonary embolism (HCC) 02/24/2016  . Sleep apnea    "have CPAP; can't tolerate it" (02/24/2016)    Social History   Social History  . Marital status: Divorced    Spouse name: N/A  . Number of children: N/A  . Years of education: N/A   Social History Main Topics  . Smoking status: Current Some Day Smoker    Packs/day: 3.00    Years: 35.00    Types: Cigarettes  . Smokeless tobacco: Never Used     Comment: 02/24/2016 "stopped ~ 8-9 months ago; have had 5 cigarettes in  the last week"  . Alcohol use No     Comment: 02/24/2016 "none in a long time"  . Drug use: Yes    Types: Cocaine     Comment: 02/24/2016 "none since 1995"  . Sexual activity: Yes   Other Topics Concern  . None   Social History Narrative  . None    ECHO:Study Conclusions-02/24/16  - Left ventricle: The cavity size was mildly dilated. There was   moderate focal basal hypertrophy. Systolic function was severely   reduced. The estimated ejection fraction was in the range of 25%   to 30%. Diffuse hypokinesis. There is akinesis of the   inferolateral myocardium. There is akinesis of the basalinferior   myocardium. - Aortic valve: Poorly visualized. Mildly calcified annulus.   Trileaflet; normal thickness leaflets. - Mitral valve: There was mild to moderate regurgitation. - Left atrium: The atrium was mildly dilated. - Right ventricle: The cavity size was moderately dilated. Wall   thickness was normal. Systolic function was moderately reduced. - Right atrium: The atrium was mildly dilated. - Pulmonic valve: Poorly visualized. - Pulmonary arteries: PA peak pressure: 39 mm Hg (S). - Pericardium, extracardiac: A trivial, free-flowing pericardial   effusion was identified at the apex. The fluid had no internal   echoes.  Impressions:  - The right ventricular systolic pressure was increased consistent   with mild pulmonary hypertension.  BNP    Component Value Date/Time   BNP 1,633.0 (H)  02/24/2016 0108    ProBNP No results found for: PROBNP   Education Assessment and Provision:  Detailed education and instructions provided on heart failure disease management including the following:  Signs and symptoms of Heart Failure When to call the physician Importance of daily weights Low sodium diet Fluid restriction Medication management Anticipated future follow-up appointments  Patient education given on each of the above topics.  Patient acknowledges understanding and  acceptance of all instructions.  I spoke with Mr. Eckford regarding his current hospitalization and recommendations for home regarding his HF.  He does have a scale and I explained the importance of daily weights as well as when to call the physician.  I reviewed a low sodium diet and high sodium foods to avoid.  He tells me that he has no issues getting medications-only with the blood thinner recently due to a 1 hour class required prior to pick up.  He plans to follow in the [facility] and I have provided a map/ directions.  I advised him to call me or the clinic with any concerns/ questions related to his HF after discharge.  Education Materials:  "Living Better With Heart Failure" Booklet, Daily Weight Tracker Tool  High Risk Criteria for Readmission and/or Poor Patient Outcomes:  (Recommend Follow-up with [facility])--yes he plans to follow in the [facility]   EF <30%- yes 25-30%  2 or more admissions in 6 months-No 2 /6 mo  Difficult social situation- No   Demonstrates medication noncompliance- only recently with blood thinners per patient   Barriers of Care:  Knowledge and compliance   Discharge Planning:   Plans to return to home with roommate and a friend

## 2016-03-05 NOTE — Discharge Summary (Signed)
Physician Discharge Summary  Max Rivas TZG:[id number] DOB: [birth year] DOA: 02/24/2016  PCP: Jyl Heinz, MD  Admit date: 02/24/2016 Discharge date: 03/05/2016  Recommendations for Outpatient Follow-up:   Amiodarone 400 mg BID x 6 more days, then 200 mg BID x 7 days, then 200 mg daily. Digoxin 0.125 mg daily Losartan 12.5 mg BID Spironolactone 25 mg daily Toprol XL 12.5 mg daily   Taper down prednisone starting from 50 mg a day, taper down by 5 mg a day down to 0 mg. for ex, today 50 mg, tomorrow 45 mg, then 40 mg the following day and etc...  Xarelto 15 mg BID until 03/24/16. Then start Xarelto 20 mg daily.  Torsemide 60 mg daily Atorvastatin 20 mg daily  Will need HF follow up in next couple of weeks.  Will plan BMET next week and follow up shortly after. Will need DCCV once on Xarelto for 3-4 weeks.     Discharge Diagnoses:  Principal Problem:   Acute respiratory failure with hypoxia (HCC) Active Problems:   Acute systolic congestive heart failure (HCC)   Atrial fibrillation with RVR (HCC)   DCM (dilated cardiomyopathy) (HCC)   Coronary artery disease involving native coronary artery of native heart without angina pectoris   Pulmonary embolus (HCC)   Pulmonary artery thrombosis (HCC)   Lung mass   Acute on chronic respiratory failure with hypoxia (HCC)   Mediastinal adenopathy   Cervical adenopathy   COPD exacerbation (HCC)    Discharge Condition: stable   Diet recommendation: as tolerated   History of present illness:  66 year old male with past medical history of ischemic cardiomyopathy (EF 25-30% by ECHO in 2012), recent hospitalization for acute decompensated CHF and atrial fibrillation on xarelto, PE and lung mass concerning for cancer. He presented to ED with shortness of breath at rest with exertion as well as leg swelling for 48 hours prior to this admission. Pt also had chest pain and associated productive cough, fever and chills.  D dimer was positive  and CT angio chest was subsequently done and showed right pulmonary artery thrombus and possible lung mass and bilateral pleural effusion. Xarelto on discharge in 01/2016 was prescribed but pt never took it as he had to attend class in [place] before obtaining the medication.  Hospital Course:  Assessment & Plan:  Acute respiratory failurewith hypoxia / Acute pulmonary embolism / Acute COPD exacerbation  - 2 D ECHO on this admission showed EF 25%, diffuse hypokinesis - Was on Lasix 80 mg IV Q 8 hours; cardio stopped 2/21 and started Demadex 60 mg daily - Continue prednisone taper   Right-sided pulmonary thrombus with right infrahilar mass, possible lung cancer  - Continue heparin drip for now - Per pulmonary, outpt follow up with Dr. Delton Coombes in few to several weeks and outpt PET scan. If all of this resolves then this mass possible post PE infarction, if nto then he needs biopsy. Avoid bronch due to sedation risk  Atrial fibrillation with RVR  - CHADS vasc score 2 - Stopped heparin drip today and usinf xarelto per cardio recommendations  - Continue metoprolol 12.5 mg daily  - Converted amiodarone to PO 2/21 - tapering per cardio   - Continue digoxin 0.125 mg daily  - Per cardio, eventually would favor DCCV attempt. However, would not do DCCV at this time with fresh PE. Would give time for PE to stabilize and then do DCCV in a month or so  Thrombocytopenia - Likely in the  setting of heparin anticoagulation - Platelets 121 --> 129  Essential hypertension - Continue metoprolol 12.5 mg daily  - Continue losartan 12.5 mg daily   Acute on chronic systolic CHF, NYHA class IIIb symptoms / Ischemic cardiomyopathy  - Exacerbation likely triggered by ongoing atrial fibrillation and RV failure made worse by PE - Given metolazone (started 02/25/16) 2.5 mg given daily; last dose 2/18 - Stop milrinone drip today - Continue torsemide 60 mg daily on discharge  - Continue spironolactone 25 mg  daily - Continue Toprol XL 12.5 mg daily  - Continue losartan, 12.5 mg daily  - Continue digoxin 0.125 mg daily, level okay 2/19   Hypokalemia / Metabolic alkalosis - Due to lasix - No potassium on discharge due to increase in aldactone on discharge per cardio   Hyponatremia - Likely in the setting of acute lung process, diuretic  - Sodium 128-130  Dyslipidemia - Continue Lipitor and omega 3 supplementation   Chronic kidney disease stage 3 - Baseline Cr 1.55 about 1 month ago - Cr within baseline range   Elevated TSH - TSH 8.8 on this admission - Needs follow up in outpt setting    DVT prophylaxis: On xarelto  Code Status: DNR/DNI Family Communication: no family at the bedside this am   Consultants:   Cardio  PCCM  Procedures:   None  Antimicrobials:   None     Signed:  Manson Passey, MD  [facility] 03/05/2016, 11:45 AM  Pager #: [phone]  Time spent in minutes: more than 30 minutes  Discharge Exam: Vitals:   03/05/16 0821 03/05/16 1020  BP: (!) 100/51 105/87  Pulse: 77 (!) 37  Resp: 20 18  Temp: 97.4 F (36.3 C)    Vitals:   03/05/16 0355 03/05/16 0821 03/05/16 0841 03/05/16 1020  BP: (!) 105/55 (!) 100/51  105/87  Pulse: 76 77  (!) 37  Resp: 20 20  18   Temp: 97.7 F (36.5 C) 97.4 F (36.3 C)    TempSrc: Oral Oral    SpO2: 91% 96% 91% 92%  Weight: 99.6 kg (219 lb 8 oz)     Height:        General: Pt is alert, follows commands appropriately, not in acute distress Cardiovascular: Rate controlled, S1/S2 + Respiratory: diminished breath sounds, no wheezing  Abdominal: Soft, non tender, non distended, bowel sounds +, no guarding Extremities: +2 LE edema, no cyanosis, pulses palpable bilaterally DP and PT Neuro: Grossly nonfocal  Discharge Instructions  Discharge Instructions    Call MD for:  persistant nausea and vomiting    Complete by:  As directed    Call MD for:  redness, tenderness, or signs of infection  (pain, swelling, redness, odor or green/yellow discharge around incision site)    Complete by:  As directed    Call MD for:  severe uncontrolled pain    Complete by:  As directed    Diet - low sodium heart healthy    Complete by:  As directed    Discharge instructions    Complete by:  As directed    Amiodarone 400 mg BID x 6 more days, then 200 mg BID x 7 days, then 200 mg daily. Digoxin 0.125 mg daily Losartan 12.5 mg BID Spironolactone 25 mg daily Toprol XL 12.5 mg daily   Taper down prednisone starting from 50 mg a day, taper down by 5 mg a day down to 0 mg. for ex, today 50 mg, tomorrow 45 mg, then 40 mg  the following day and etc...  Xarelto 15 mg BID until 03/24/16. Then start Xarelto 20 mg daily.  Torsemide 60 mg daily Atorvastatin 20 mg daily  Will need HF follow up in next couple of weeks.  Will plan BMET next week and follow up shortly after. Will need DCCV once on Xarelto for 3-4 weeks.    Increase activity slowly    Complete by:  As directed      Allergies as of 03/05/2016      Reactions   Crestor [rosuvastatin Calcium] Other (See Comments)   Back spasms      Medication List    STOP taking these medications   aspirin EC 81 MG tablet   furosemide 20 MG tablet Commonly known as:  LASIX   SUPER OMEGA 3 EPA/DHA 1000 MG Caps     TAKE these medications   acetaminophen 325 MG tablet Commonly known as:  TYLENOL Take 2 tablets (650 mg total) by mouth every 6 (six) hours as needed for mild pain (or Fever >/= 101).   albuterol 108 (90 Base) MCG/ACT inhaler Commonly known as:  PROVENTIL HFA;VENTOLIN HFA Inhale 2 puffs into the lungs every 6 (six) hours as needed for wheezing or shortness of breath.   amiodarone 200 MG tablet Commonly known as:  PACERONE Take 2 tablets (400 mg) twice daily until 03/11/16. Then take 1 tablet (200 mg) twice daily until 03/18/16. Then start 1 tablet (200 mg) daily   arformoterol 15 MCG/2ML Nebu Commonly known as:  BROVANA Take 2 mLs  (15 mcg total) by nebulization 2 (two) times daily.   atorvastatin 20 MG tablet Commonly known as:  LIPITOR Take 1 tablet (20 mg total) by mouth daily at 6 PM.   digoxin 0.125 MG tablet Commonly known as:  LANOXIN Take 1 tablet (0.125 mg total) by mouth daily. What changed:  medication strength  how much to take   levalbuterol 0.63 MG/3ML nebulizer solution Commonly known as:  XOPENEX Take 3 mLs (0.63 mg total) by nebulization every 6 (six) hours as needed for wheezing or shortness of breath.   losartan 25 MG tablet Commonly known as:  COZAAR Take 0.5 tablets (12.5 mg total) by mouth 2 (two) times daily.   metoprolol succinate 25 MG 24 hr tablet Commonly known as:  TOPROL-XL Take 0.5 tablets (12.5 mg total) by mouth daily. Start taking on:  03/06/2016 What changed:  how much to take  when to take this   mometasone 220 MCG/INH inhaler Commonly known as:  ASMANEX Inhale 2 puffs into the lungs daily.   multivitamin with minerals Tabs tablet Take 1 tablet by mouth daily.   omega-3 acid ethyl esters 1 g capsule Commonly known as:  LOVAZA Take 1 capsule (1 g total) by mouth 2 (two) times daily.   omeprazole 20 MG capsule Commonly known as:  PRILOSEC Take 40 mg by mouth daily.   predniSONE 5 MG tablet Commonly known as:  DELTASONE Take 10 tablets (50 mg total) by mouth 2 (two) times daily with a meal.   Rivaroxaban 15 & 20 MG Tbpk Start with one 15mg  tablet by mouth twice daily with food. On 03/24/16, switch to one 20mg  tablet once a day with food. What changed:  You were already taking a medication with the same name, and this prescription was added. Make sure you understand how and when to take each.   rivaroxaban 20 MG Tabs tablet Commonly known as:  XARELTO Take 1 tablet (20 mg total) by mouth  daily with supper. Start taking on:  03/24/2016 What changed:  Another medication with the same name was added. Make sure you understand how and when to take each.    spironolactone 25 MG tablet Commonly known as:  ALDACTONE Take 1 tablet (25 mg total) by mouth daily. Start taking on:  03/06/2016   STIOLTO RESPIMAT 2.5-2.5 MCG/ACT Aers Generic drug:  Tiotropium Bromide-Olodaterol Inhale 2 puffs into the lungs daily.   torsemide 20 MG tablet Commonly known as:  DEMADEX Take 3 tablets (60 mg total) by mouth daily. Start taking on:  03/06/2016            Durable Medical Equipment        Start     Ordered   03/05/16 0840  Heart failure home health orders  (Heart failure home health orders / Face to face)  Once    Comments:  Heart Failure Follow-up Care:  Verify follow-up appointments per Patient Discharge Instructions. Confirm transportation arranged. Reconcile home medications with discharge medication list. Remove discontinued medications from use. Assist patient/caregiver to manage medications using pill box. Reinforce low sodium food selection Assessments: Vital signs and oxygen saturation at each visit. Assess home environment for safety concerns, caregiver support and availability of low-sodium foods. Consult Child psychotherapist, PT/OT, Dietitian, and CNA based on assessments. Perform comprehensive cardiopulmonary assessment. Notify MD for any change in condition or weight gain of 3 pounds in one day or 5 pounds in one week with symptoms. Daily Weights and Symptom Monitoring: Ensure patient has access to scales. Teach patient/caregiver to weigh daily before breakfast and after voiding using same scale and record.    Teach patient/caregiver to track weight and symptoms and when to notify Provider. Activity: Develop individualized activity plan with patient/caregiver.  Labs as needed. Faxed to HF clinic as above.  Question Answer Comment  Heart Failure Follow-up Care [facility] at [phone]   Obtain the following labs Other see comments   Lab frequency Other see comments   Fax lab results to AHF Clinic at  [phone]   Diet Low Sodium Heart Healthy   Fluid restrictions: 2000 mL Fluid   Consult: Social work      03/05/16 0840     Follow-up Information    BYRUM,ROBERT S., MD Follow up on 03/24/2016.   Specialty:  Pulmonary Disease Why:  3:45 PM [facility]. 2nd floor Contact information: [address] [place] [address] [phone]        [facility] Follow up on 03/12/2016.   Specialty:  Cardiology Why:  for post hospital LABWORK.    Code for parking is 5002. Enter through contstruction site off [address]. Underground parking on your right.  Can also park in Lower ED lot and enter [employer].  Contact information: [address] [id number] mc [place] [address] [phone]       Marca Ancona, MD Follow up on 03/20/2016.   Specialty:  Cardiology Why:  at 1030 for post hospital follow up. Code for parking is 5002. Enterh through [employer] representative site [employer]. Underground parking on your right. Can also park in Lower ED lot and Enter [employer].  Contact information: [address] [place] [address] [phone]            The results of significant diagnostics from this hospitalization (including imaging, microbiology, ancillary and laboratory) are listed below for reference.    Significant Diagnostic Studies: Ct Soft Tissue  Neck W Contrast  Result Date: 02/25/2016 CLINICAL DATA:  Lymphadenopathy EXAM: CT NECK WITH CONTRAST TECHNIQUE: Multidetector CT imaging of the neck was performed using the standard protocol following the bolus administration of intravenous contrast. CONTRAST:  75mL ISOVUE-300 IOPAMIDOL (ISOVUE-300) INJECTION 61% COMPARISON:  None. FINDINGS: Pharynx and larynx: The nasopharynx is clear. The oropharynx and hypopharynx are normal. The epiglottis, supraglottic larynx, glottis and subglottic larynx are normal. No retropharyngeal collection. The  parapharyngeal spaces are preserved. The visible oral cavity and base of tongue are normal. Salivary glands: The parotid and submandibular glands are normal. No sialolithiasis or salivary ductal dilatation. Thyroid: Normal Lymph nodes: There is no enlarged or abnormal density cervical lymph node. Vascular: There is aortic atherosclerosis. There is bilateral atherosclerotic plaque at the carotid bifurcations, right worse than left. Limited intracranial: Normal. Visualized orbits: Normal. Mastoids and visualized paranasal sinuses: Clear Skeleton: There are no lytic or blastic osseous lesions. No bony spinal canal stenosis. Upper chest: There is a medium-sized left pleural effusion. There is a large, partially calcified pleural plaque along the lateral right pleural surface. There is biapical emphysema. No pulmonary nodules or masses. Other: There is a a right-sided PICC line with tip below the field of view. IMPRESSION: 1. No cervical lymphadenopathy or mass. 2. Partially calcified right lateral pleural plaque, possibly reflecting prior asbestos exposure. 3. Medium-sized left pleural effusion. Electronically Signed   By: Deatra Robinson M.D.   On: 02/25/2016 22:19   Ct Angio Chest Pe W And/or Wo Contrast  Result Date: 02/24/2016 CLINICAL DATA:  Hypotension with positive D-dimer. EXAM: CT ANGIOGRAPHY CHEST WITH CONTRAST TECHNIQUE: Multidetector CT imaging of the chest was performed using the standard protocol during bolus administration of intravenous contrast. Multiplanar CT image reconstructions and MIPs were obtained to evaluate the vascular anatomy. CONTRAST:  100 mL Isovue 370 COMPARISON:  None. FINDINGS: Cardiovascular: Large filling defect beginning in the distal right main pulmonary artery and extending into the lower lobe branches. No flow is demonstrated in right lower lobe branches. Appearance is consistent with pulmonary arterial thrombus or embolus. Cardiac enlargement. RV to LV ratio is less than 1,  suggesting no evidence of right heart strain. Reflux of contrast material into the hepatic veins is consistent with right heart failure. No pericardial effusion. Calcification of the thoracic aorta and coronary arteries. Mediastinum/Nodes: Scattered lymph nodes are present throughout the mediastinum and in both hilar regions. Right paratracheal lymph nodes measure about 1.8 cm short axis dimension. Appearance is nonspecific. These may be reactive nodes although pathologic lymphadenopathy is not excluded. Lungs/Pleura: Diffuse right-sided pleural thickening with pleural calcifications. Bilateral pleural effusions. Basilar atelectasis bilaterally. Emphysematous changes in the lungs. Right upper lung nodule with mild spiculation measures 7 mm diameter. There is effacement and narrowing of the right lower lobe bronchus with postobstructive consolidation. Suspicion of a a right infrahilar mass measuring 3.4 cm diameter. This could also be causing narrowing of the right lower lobe pulmonary artery, resulting in the thrombosis. Upper Abdomen: Upper abdominal ascites is present. Enlarged lateral segment of the left lobe of the liver suggests cirrhosis. Diffuse soft tissue edema. Musculoskeletal: No chest wall abnormality. No acute or significant osseous findings. Review of the MIP images confirms the above findings. IMPRESSION: Thrombosis in the distal right main pulmonary artery and extending into lower lobe branches. No right heart strain. I am suspicious that there is a mass in the right infrahilar region causing narrowing of the right lower lobe bronchus and associated postobstructive change. This mass may also be  compressing the right pulmonary artery. Indeterminate 7 mm nodule in the right upper lobe. Indeterminate borderline enlarged hilar and mediastinal lymph nodes. Bilateral pleural effusions with basilar atelectasis. Right-sided pleural thickening and pleural calcifications. Upper abdominal ascites with probable  hepatic cirrhosis. These results were called by telephone at the time of interpretation on 02/24/2016 at 3:14 am to Dr. Ross Marcus , who verbally acknowledged these results. Electronically Signed   By: Burman Nieves M.D.   On: 02/24/2016 03:17   Dg Chest Port 1 View  Result Date: 02/25/2016 CLINICAL DATA:  Status post right-sided PICC line placement. Assess positioning of the tip. EXAM: PORTABLE CHEST 1 VIEW COMPARISON:  Chest x-ray of February 24 2016. FINDINGS: The PICC line tip projects over the junction of the middle and distal thirds of the SVC. No postprocedure pneumothorax is observed. There are bilateral pleural effusions which are stable. The cardiac silhouette is enlarged. The pulmonary vascularity is engorged. The ICD is in stable position where visualized. IMPRESSION: The tip of the PICC line projects over the junction of the middle and distal thirds of the SVC. Electronically Signed   By: David  [place] M.D.   On: 02/25/2016 10:26   Dg Chest Portable 1 View  Result Date: 02/24/2016 CLINICAL DATA:  Chest pain and shortness of breath.  Home oxygen. EXAM: PORTABLE CHEST 1 VIEW COMPARISON:  01/15/2016 FINDINGS: Cardiac pacemaker. Cardiac enlargement without significant pulmonary vascular congestion. Perihilar interstitial changes suggesting edema. Bilateral pleural effusions with basilar atelectasis, greater on the right. Appearances are similar to previous study. No pneumothorax. Mediastinal contours appear intact. IMPRESSION: Cardiac enlargement with interstitial edema, bilateral pleural effusions, and basilar atelectasis, similar to the prior study. Electronically Signed   By: Burman Nieves M.D.   On: 02/24/2016 01:47    Microbiology: Recent Results (from the past 240 hour(s))  MRSA PCR Screening     Status: None   Collection Time: 02/25/16  6:20 PM  Result Value Ref Range Status   MRSA by PCR NEGATIVE NEGATIVE Final    Comment:        The GeneXpert MRSA Assay (FDA approved  for NASAL specimens only), is one component of a comprehensive MRSA colonization surveillance program. It is not intended to diagnose MRSA infection nor to guide or monitor treatment for MRSA infections.      Labs: Basic Metabolic Panel:  Recent Labs Lab 03/01/16 0450 03/02/16 0639 03/03/16 0756 03/04/16 0225 03/05/16 0500  NA 127* 130* 128* 130* 128*  K 4.9 4.8 4.4 4.6 4.7  CL 76* 76* 76* 78* 80*  CO2 43* 41* 40* 41* 39*  GLUCOSE 139* 209* 263* 140* 109*  BUN 27* 32* 33* 31* 35*  CREATININE 1.26* 1.47* 1.33* 1.35* 1.37*  CALCIUM 9.3 9.7 9.4 9.3 9.4   Liver Function Tests: No results for input(s): AST, ALT, ALKPHOS, BILITOT, PROT, ALBUMIN in the last 168 hours. No results for input(s): LIPASE, AMYLASE in the last 168 hours. No results for input(s): AMMONIA in the last 168 hours. CBC:  Recent Labs Lab 03/01/16 0450 03/02/16 0639 03/03/16 0440 03/04/16 0225 03/05/16 0500  WBC 8.6 9.4 11.7* 11.5* 12.6*  HGB 14.4 14.8 16.0 15.7 16.1  HCT 44.3 45.3 47.6 46.9 48.0  MCV 95.3 95.4 93.9 93.1 93.2  PLT 118* 110* 129* 121* 109*   Cardiac Enzymes: No results for input(s): CKTOTAL, CKMB, CKMBINDEX, TROPONINI in the last 168 hours. BNP: BNP (last 3 results)  Recent Labs  01/15/16 1605 02/24/16 0108  BNP 599.4* 1,633.0*  ProBNP (last 3 results) No results for input(s): PROBNP in the last 8760 hours.  CBG: No results for input(s): GLUCAP in the last 168 hours.

## 2016-03-06 ENCOUNTER — Emergency Department (HOSPITAL_COMMUNITY)
Admission: EM | Admit: 2016-03-06 | Discharge: 2016-03-06 | Disposition: A | Discharge status: Home / Self Care | Payer: Non-veteran care | Attending: Emergency Medicine | Admitting: Emergency Medicine

## 2016-03-06 ENCOUNTER — Encounter (HOSPITAL_COMMUNITY): Payer: Self-pay

## 2016-03-06 DIAGNOSIS — F17210 Nicotine dependence, cigarettes, uncomplicated: Secondary | ICD-10-CM | POA: Insufficient documentation

## 2016-03-06 DIAGNOSIS — I252 Old myocardial infarction: Secondary | ICD-10-CM | POA: Insufficient documentation

## 2016-03-06 DIAGNOSIS — J449 Chronic obstructive pulmonary disease, unspecified: Secondary | ICD-10-CM | POA: Insufficient documentation

## 2016-03-06 DIAGNOSIS — Z7901 Long term (current) use of anticoagulants: Secondary | ICD-10-CM | POA: Insufficient documentation

## 2016-03-06 DIAGNOSIS — R040 Epistaxis: Secondary | ICD-10-CM | POA: Diagnosis present

## 2016-03-06 DIAGNOSIS — I2510 Atherosclerotic heart disease of native coronary artery without angina pectoris: Secondary | ICD-10-CM | POA: Diagnosis not present

## 2016-03-06 DIAGNOSIS — I509 Heart failure, unspecified: Secondary | ICD-10-CM | POA: Diagnosis not present

## 2016-03-06 DIAGNOSIS — Z950 Presence of cardiac pacemaker: Secondary | ICD-10-CM | POA: Diagnosis not present

## 2016-03-06 MED ORDER — OXYMETAZOLINE HCL 0.05 % NA SOLN
3.0000 | Freq: Once | NASAL | Status: AC
  Administered 2016-03-06: 3 via NASAL
  Filled 2016-03-06: qty 15

## 2016-03-06 NOTE — ED Triage Notes (Signed)
Pt arrived via [facility] from home c/o nosebleed, takes xarelto, started 0030.

## 2016-03-06 NOTE — Discharge Instructions (Signed)
You may use over-the-counter nasal saline to keep your mucous membranes moist. I recommended she do not blow your nose for the next 3 days unless it begins to bleed again. If it begins to bleed again, I recommend you below your nose to remove any clot, spray a large amount of Afrin in both nostrils and hold pressure for 30 minutes without stopping. If this does not stop your nosebleed, please return to the hospital.

## 2016-03-06 NOTE — ED Provider Notes (Signed)
By signing my name below, I, Avnee Patel, attest that this documentation has been prepared under the direction and in the presence of Layla Maw Maryagnes Carrasco, DO  Electronically Signed: Clovis Pu, ED Scribe. 03/06/16. 1:58 AM.  TIME SEEN: 1:52 AM  CHIEF COMPLAINT:  Chief Complaint  Patient presents with  . Epistaxis    HPI:   Max Rivas is a 66 y.o. male, with a PMHx of COPD, CHF, GERD and PE on Xarelto, who presents to the Emergency Department, via EMS, complaining of an acute onset nosebleed which began around 11:30 PM yesterday. Pt states he was using his home oxygen which he increased from 2 L to 4 L when his nosebleed began. He is unaware of how long this episode lasted but notes he was still spitting out blood from his mouth upon arrival to the ED. No alleviating factors noted. Pt denies current nosebleed or any other associated symptoms. States he has been taking his nose. No trauma to the face or nose.  ROS: See HPI Constitutional: no fever  Eyes: no drainage  ENT: no runny nose   Cardiovascular:  no chest pain  Resp: no SOB  GI: no vomiting GU: no dysuria Integumentary: no rash  Allergy: no hives  Musculoskeletal: no leg swelling  Neurological: no slurred speech ROS otherwise negative  PAST MEDICAL HISTORY/PAST SURGICAL HISTORY:  Past Medical History:  Diagnosis Date  . Arthritis    "elbows, knees" (02/24/2016)  . CHF (congestive heart failure) (HCC)   . COPD (chronic obstructive pulmonary disease) (HCC)   . Coronary artery disease   . GERD (gastroesophageal reflux disease)   . High cholesterol   . Myocardial infarction ~ 1995 X 2;1998; 2000; 2004  . On home oxygen therapy    "2L w/activity" (02/24/2016)  . Pacemaker   . Pulmonary embolism (HCC) 02/24/2016  . Sleep apnea    "have CPAP; can't tolerate it" (02/24/2016)    MEDICATIONS:  Prior to Admission medications   Medication Sig Start Date End Date Taking? Authorizing Provider  acetaminophen (TYLENOL) 325 MG  tablet Take 2 tablets (650 mg total) by mouth every 6 (six) hours as needed for mild pain (or Fever >/= 101). 03/05/16   Alison Murray, MD  albuterol (PROVENTIL HFA;VENTOLIN HFA) 108 (90 Base) MCG/ACT inhaler Inhale 2 puffs into the lungs every 6 (six) hours as needed for wheezing or shortness of breath. 03/05/16   Alison Murray, MD  amiodarone (PACERONE) 200 MG tablet Take 2 tablets (400 mg) twice daily until 03/11/16. Then take 1 tablet (200 mg) twice daily until 03/18/16. Then start 1 tablet (200 mg) daily 03/05/16   Graciella Freer, PA-C  atorvastatin (LIPITOR) 20 MG tablet Take 1 tablet (20 mg total) by mouth daily at 6 PM. 03/05/16   Alison Murray, MD  digoxin (LANOXIN) 0.125 MG tablet Take 1 tablet (0.125 mg total) by mouth daily. 03/05/16   Graciella Freer, PA-C  levalbuterol Pauline Aus) 0.63 MG/3ML nebulizer solution Take 3 mLs (0.63 mg total) by nebulization every 6 (six) hours as needed for wheezing or shortness of breath. 03/05/16   Alison Murray, MD  losartan (COZAAR) 25 MG tablet Take 0.5 tablets (12.5 mg total) by mouth 2 (two) times daily. 03/05/16   Graciella Freer, PA-C  metoprolol succinate (TOPROL-XL) 25 MG 24 hr tablet Take 0.5 tablets (12.5 mg total) by mouth daily. 03/06/16   Graciella Freer, PA-C  mometasone [facility]) 220 MCG/INH inhaler Inhale 2 puffs into the  lungs daily.    Historical Provider, MD  Multiple Vitamin (MULTIVITAMIN WITH MINERALS) TABS tablet Take 1 tablet by mouth daily.    Historical Provider, MD  omega-3 acid ethyl esters (LOVAZA) 1 g capsule Take 1 capsule (1 g total) by mouth 2 (two) times daily. 03/05/16   Alison Murray, MD  omeprazole (PRILOSEC) 20 MG capsule Take 40 mg by mouth daily.    Historical Provider, MD  predniSONE (DELTASONE) 5 MG tablet Take 10 tablets (50 mg total) by mouth 2 (two) times daily with a meal. 03/05/16   Alison Murray, MD  rivaroxaban (XARELTO) 20 MG TABS tablet Take 1 tablet (20 mg total) by mouth daily with supper.  03/24/16   Graciella Freer, PA-C  Rivaroxaban 15 & 20 MG TBPK Start with one 15mg  tablet by mouth twice daily with food. On 03/24/16, switch to one 20mg  tablet once a day with food. 03/05/16   Graciella Freer, PA-C  spironolactone (ALDACTONE) 25 MG tablet Take 1 tablet (25 mg total) by mouth daily. 03/06/16   Graciella Freer, PA-C  Tiotropium Bromide-Olodaterol (STIOLTO RESPIMAT) 2.5-2.5 MCG/ACT AERS Inhale 2 puffs into the lungs daily.    Historical Provider, MD  torsemide (DEMADEX) 20 MG tablet Take 3 tablets (60 mg total) by mouth daily. 03/06/16   Graciella Freer, PA-C    ALLERGIES:  Allergies  Allergen Reactions  . Crestor [Rosuvastatin Calcium] Other (See Comments)    Back spasms    SOCIAL HISTORY:  Social History  Substance Use Topics  . Smoking status: Current Some Day Smoker    Packs/day: 3.00    Years: 35.00    Types: Cigarettes  . Smokeless tobacco: Never Used     Comment: 02/24/2016 "stopped ~ 8-9 months ago; have had 5 cigarettes in the last week"  . Alcohol use No     Comment: 02/24/2016 "none in a long time"    FAMILY HISTORY: Family History  Problem Relation Age of Onset  . Heart attack Mother   . Heart attack Father   . Heart attack Sister 43    EXAM: BP 97/60   Pulse 81   Temp 98.9 F (37.2 C) (Oral)   Resp 17   SpO2 94%  CONSTITUTIONAL: Alert and oriented and responds appropriately to questions. Well-appearing; well-nourished HEAD: Normocephalic EYES: Conjunctivae clear, PERRL, EOMI ENT: normal nose; no rhinorrhea; moist mucous membranes. Dried blood in bilateral nares and dried blood in mouth. No active bleeding in posterior oropharynx, normal phonation, no stridor, no drooling, no tonsillar hypertrophy or exudate NECK: Supple, no meningismus, no nuchal rigidity, no LAD  CARD: RRR; S1 and S2 appreciated; no murmurs, no clicks, no rubs, no gallops RESP: Normal chest excursion without splinting or tachypnea; breath sounds clear  and equal bilaterally; no wheezes, no rhonchi, no rales, no hypoxia or respiratory distress, speaking full sentences ABD/GI: Normal bowel sounds; non-distended; soft, non-tender, no rebound, no guarding, no peritoneal signs, no hepatosplenomegaly BACK:  The back appears normal and is non-tender to palpation, there is no CVA tenderness EXT: Normal ROM in all joints; non-tender to palpation; no edema; normal capillary refill; no cyanosis, no calf tenderness or swelling    SKIN: Normal color for age and race; warm; no rash NEURO: Moves all extremities equally PSYCH: The patient's mood and manner are appropriate. Grooming and personal hygiene are appropriate.  MEDICAL DECISION MAKING: Patient here with bleeding from bilateral nares. No active bleeding currently but does have dried blood in both nostrils  and in his mouth. We will spray Afrin into his nose and watch him.  Suspect his bleeding is from him picking his nose today and chronically being on nasal cannula which has dried out his mucous membranes and also being on Xarelto. He has slightly low blood pressure but on review of his records this appears to be baseline for him. Was just admitted for CHF exacerbation and states that he is feeling much better from this standpoint and not having any shortness of breath or cough at this time.  ED PROGRESS: 3:15 AM  Pt has been given Afrin and has been monitored. No further nosebleed. I feel he is safe to be discharged home. Discussed with him not to put anything into his nose except for his nasal cannula and nasal saline to keep his mucous membranes moist. Have discussed with him at his nose begins to bleed he may use Afrin nasal spray which we are discharging him home with and apply direct pressure for 30 minutes. Discussed with him if this does not stop the bleeding, he should return to the hospital. I feel he is safe for discharge home. Patient and family are comfortable with this plan. We'll get outpatient  ENT follow-up as needed.  At this time, I do not feel there is any life-threatening condition present. I have reviewed and discussed all results (EKG, imaging, lab, urine as appropriate) and exam findings with patient/family. I have reviewed nursing notes and appropriate previous records.  I feel the patient is safe to be discharged home without further emergent workup and can continue workup as an outpatient as needed. Discussed usual and customary return precautions. Patient/family verbalize understanding and are comfortable with this plan.  Outpatient follow-up has been provided. All questions have been answered.     I personally performed the services described in this documentation, which was scribed in my presence. The recorded information has been reviewed and is accurate.     Layla Maw Loralee Weitzman, DO 03/06/16 [phone]

## 2016-03-10 DIAGNOSIS — J441 Chronic obstructive pulmonary disease with (acute) exacerbation: Secondary | ICD-10-CM

## 2016-03-12 ENCOUNTER — Other Ambulatory Visit (HOSPITAL_COMMUNITY): Payer: Self-pay

## 2016-03-14 ENCOUNTER — Telehealth: Payer: Self-pay | Admitting: Internal Medicine

## 2016-03-14 NOTE — Telephone Encounter (Signed)
RN Autumn called for increased weight gain of 6 lbs at home. Advised to double to the dose of lasix to 80 mg daily. Pt was on torsemide per notes here. Probablywill need to be switched to torsemide if weight gain continues.

## 2016-03-15 NOTE — Telephone Encounter (Signed)
He should be on torsemide 60 mg daily.  Please make sure he has followup and that he is on the meds recommended in my last note prior to discharge from the hospital

## 2016-03-16 NOTE — Telephone Encounter (Signed)
Pt is sch for post hosp f/u w/Dr Shirlee Latch on Fri 3/9.  Attempted to call pt to clarify diuretic and Left message to call back

## 2016-03-16 NOTE — Telephone Encounter (Signed)
Patient called triage line and he checked all of his pill bottles and he is NOT currently taking torsemide 60 mg Daily.  He did say he is taking lasix 40 mg Daily.  He was unsure when he was switched from torsemide to lasix.  He said his weight is down 3 lbs and feels great.  I instructed to him to bring all his medication bottles with him to his appointment this Friday so we can verify what he is taking.  No further questions at this time.

## 2016-03-20 ENCOUNTER — Encounter (HOSPITAL_COMMUNITY): Payer: Self-pay

## 2016-03-23 ENCOUNTER — Telehealth (HOSPITAL_COMMUNITY): Payer: Self-pay | Admitting: Cardiology

## 2016-03-23 NOTE — Telephone Encounter (Signed)
Called patient to reschedule for his missed HFU appt on 03/20/16.  Pt stated he is being treated and getting his medications from the [place].  He is not interested in scheduling/rescheduling his appt at this time.

## 2016-03-24 ENCOUNTER — Inpatient Hospital Stay: Payer: Self-pay | Admitting: Emergency Medicine

## 2016-03-25 ENCOUNTER — Telehealth: Payer: Self-pay | Admitting: Licensed Clinical Social Worker

## 2016-03-25 NOTE — Telephone Encounter (Signed)
CSW referred to follow up with patient due to missed appointment. Patient reports he has no insurance and is able to get medical follow up through the [place] with minimal to no cost. Patient states he was seen by cardiologist at the [place] and will obtain any follow up through the [place]. CSW available if needed. Lasandra Beech, LCSW, CCSW-MCS [phone]

## 2017-03-23 NOTE — Progress Notes (Signed)
Cardiology Office Note    Date:  03/24/2017   ID:  TREGAN READ, DOB Jun 05, 1950, MRN [id number]  PCP:  Jyl Heinz, MD  Cardiologist: Lesleigh Noe, MD   No chief complaint on file.   History of Present Illness:  Max Rivas is a 67 y.o. male referred by the [facility] for second opinion concerning congestive heart failure, coronary artery disease, atrial fibrillation, and overall cardiovascular status.  Patient has congestive heart failure related to coronary artery disease and prior myocardial infarctions starting 7 when he had acute intervention performed at [facility].  Subsequently had other stents placed and the most recent heart cath was 2004 at [facility].  3 stents were placed at that time.  He is at chronic systolic heart failure since that time, has a defibrillator, and has had nearly all care within the [facility] for the past 15 years.  He has not Medicare eligible and is attempting to have physicians in the community assume care.  He denies chest pain.  There is no orthopnea.  He is physically active.  His AICD, placed at [facility] has been upgraded due to lead fracture at the [facility] in [place].  There is a [employer] device.  He denies angina.  No peripheral edema.  Past Medical History:  Diagnosis Date  . Arthritis    "elbows, knees" (02/24/2016)  . CHF (congestive heart failure) (HCC)   . COPD (chronic obstructive pulmonary disease) (HCC)   . Coronary artery disease   . GERD (gastroesophageal reflux disease)   . High cholesterol   . Myocardial infarction [facility]) ~ 1995 X 2;1998; 2000; 2004  . On home oxygen therapy    "2L w/activity" (02/24/2016)  . Pacemaker   . Pulmonary embolism (HCC) 02/24/2016  . Sleep apnea    "have CPAP; can't tolerate it" (02/24/2016)    Past Surgical History:  Procedure Laterality Date  . CORONARY ANGIOPLASTY  1995  . CORONARY ANGIOPLASTY WITH STENT  PLACEMENT  ~ 1995 - 2004 X 5   "I've got a total of 5 stents" (02/24/2016)  . 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 PACEMAKER GENERATOR CHANGE  03/2011    [facility]  . TONSILLECTOMY      Current Medications: Outpatient Medications Prior to Visit  Medication Sig Dispense Refill  . acetaminophen (TYLENOL) 325 MG tablet Take 2 tablets (650 mg total) by mouth every 6 (six) hours as needed for mild pain (or Fever >/= 101). 30 tablet 0  . amiodarone (PACERONE) 200 MG tablet Take 200 mg by mouth daily.    Marland Kitchen apixaban (ELIQUIS) 5 MG TABS tablet Take 5 mg by mouth 2 (two) times daily.    . carboxymethylcellulose (REFRESH PLUS) 0.5 % SOLN Place 1 drop into both eyes at bedtime.    Marland Kitchen losartan (COZAAR) 25 MG tablet Take 12.5 mg by mouth daily.    . metoprolol succinate (TOPROL-XL) 50 MG 24 hr tablet Take 75 mg by mouth 2 (two) times daily. Take with or immediately following a meal.    . mometasone (ASMANEX) 220 MCG/INH inhaler Inhale 2 puffs into the lungs 2 (two) times daily.     . Multiple Vitamin (MULTIVITAMIN WITH MINERALS) TABS tablet Take 1 tablet by mouth daily.    . nitroGLYCERIN (NITROSTAT) 0.4 MG SL tablet Place 0.4 mg under the  tongue every 5 (five) minutes x 3 doses as needed for chest pain.    Marland Kitchen omega-3 acid ethyl esters (LOVAZA) 1 g capsule Take 1 capsule (1 g total) by mouth 2 (two) times daily. 60 capsule 0  . Polyethyl Glycol-Propyl Glycol 0.4-0.3 % SOLN Place 1 drop into both eyes 3 (three) times daily. (SYSTANE DROPS)    . PRESCRIPTION MEDICATION Inhale 2 puffs into the lungs every 6 (six) hours as needed (wheezing/shortness of breath). (LEVALBUTEROL INHALER)    . rosuvastatin (CRESTOR) 40 MG tablet Take 40 mg by mouth daily.    Marland Kitchen spironolactone (ALDACTONE) 25 MG tablet Take 1 tablet (25 mg total) by mouth daily. 30 tablet 6  . Tiotropium Bromide-Olodaterol (STIOLTO RESPIMAT) 2.5-2.5 MCG/ACT AERS Inhale 2 puffs into the lungs 2  (two) times daily.     Marland Kitchen albuterol (PROVENTIL HFA;VENTOLIN HFA) 108 (90 Base) MCG/ACT inhaler Inhale 2 puffs into the lungs every 6 (six) hours as needed for wheezing or shortness of breath. (Patient not taking: Reported on 03/24/2017) 1 Inhaler 3  . amiodarone (PACERONE) 200 MG tablet Take 2 tablets (400 mg) twice daily until 03/11/16. Then take 1 tablet (200 mg) twice daily until 03/18/16. Then start 1 tablet (200 mg) daily (Patient not taking: Reported on 03/24/2017) 54 tablet 3  . atorvastatin (LIPITOR) 20 MG tablet Take 1 tablet (20 mg total) by mouth daily at 6 PM. (Patient not taking: Reported on 03/24/2017) 30 tablet 0  . digoxin (LANOXIN) 0.125 MG tablet Take 1 tablet (0.125 mg total) by mouth daily. (Patient not taking: Reported on 03/24/2017) 30 tablet 6  . levalbuterol (XOPENEX) 0.63 MG/3ML nebulizer solution Take 3 mLs (0.63 mg total) by nebulization every 6 (six) hours as needed for wheezing or shortness of breath. (Patient not taking: Reported on 03/24/2017) 3 mL 0  . losartan (COZAAR) 25 MG tablet Take 0.5 tablets (12.5 mg total) by mouth 2 (two) times daily. (Patient not taking: Reported on 03/24/2017) 30 tablet 6  . metoprolol succinate (TOPROL-XL) 25 MG 24 hr tablet Take 0.5 tablets (12.5 mg total) by mouth daily. (Patient not taking: Reported on 03/24/2017) 15 tablet 6  . omeprazole (PRILOSEC) 20 MG capsule Take 40 mg by mouth daily.    . predniSONE (DELTASONE) 5 MG tablet Take 10 tablets (50 mg total) by mouth 2 (two) times daily with a meal. (Patient not taking: Reported on 03/24/2017) 55 tablet 0  . rivaroxaban (XARELTO) 20 MG TABS tablet Take 1 tablet (20 mg total) by mouth daily with supper. (Patient not taking: Reported on 03/24/2017) 30 tablet 6  . Rivaroxaban 15 & 20 MG TBPK Start with one 15mg  tablet by mouth twice daily with food. On 03/24/16, switch to one 20mg  tablet once a day with food. (Patient not taking: Reported on 03/24/2017) 51 each 0  . torsemide (DEMADEX) 20 MG tablet Take 3  tablets (60 mg total) by mouth daily. (Patient not taking: Reported on 03/24/2017) 90 tablet 6   No facility-administered medications prior to visit.      Allergies:   Crestor [rosuvastatin calcium]   Social History   Socioeconomic History  . Marital status: Divorced    Spouse name: None  . Number of children: None  . Years of education: None  . Highest education level: None  Social Needs  . Financial resource strain: None  . Food insecurity - worry: None  . Food insecurity - inability: None  . Transportation needs - medical: None  . Transportation needs - non-medical:  None  Occupational History  . None  Tobacco Use  . Smoking status: Current Some Day Smoker    Packs/day: 3.00    Years: 35.00    Pack years: 105.00    Types: Cigarettes  . Smokeless tobacco: Never Used  . Tobacco comment: 02/24/2016 "stopped ~ 8-9 months ago; have had 5 cigarettes in the last week"  Substance and Sexual Activity  . Alcohol use: No    Comment: 02/24/2016 "none in a long time"  . Drug use: Yes    Types: Cocaine    Comment: 02/24/2016 "none since 1995"  . Sexual activity: Yes  Other Topics Concern  . None  Social History Narrative  . None     Family History:  The patient's family history includes Heart attack in his father and mother; Heart attack (age of onset: 36) in his sister.   ROS:   Please see the history of present illness.    Shawnn still smokes cigarettes.  He has dyspnea on exertion.  He has not had syncope.  He has difficulty with his eyes and is slowly losing vision. All other systems reviewed and are negative.   PHYSICAL EXAM:   VS:  BP 116/64   Pulse (!) 54   Ht 5\' 11"  (1.803 m)   Wt 242 lb (109.8 kg)   BMI 33.75 kg/m    GEN: Well nourished, well developed, in no acute distress  HEENT: normal  Neck: no JVD, carotid bruits, or masses Cardiac: RRR; no murmurs, rubs, or gallops,no edema  Respiratory:  clear to auscultation bilaterally, normal work of breathing GI:  soft, nontender, nondistended, + BS MS: no deformity or atrophy  Skin: warm and dry, no rash Neuro:  Alert and Oriented x 3, Strength and sensation are intact Psych: euthymic mood, full affect  Wt Readings from Last 3 Encounters:  03/24/17 242 lb (109.8 kg)  03/05/16 219 lb 8 oz (99.6 kg)  01/19/16 241 lb 6.4 oz (109.5 kg)      Studies/Labs Reviewed:   EKG:  EKG sinus bradycardia, poor R wave progression, inferior infarct, low voltage.  Recent Labs: No results found for requested labs within last 8760 hours.   Lipid Panel No results found for: CHOL, TRIG, HDL, CHOLHDL, VLDL, LDLCALC, LDLDIRECT  Additional studies/ records that were reviewed today include:  2D Doppler echocardiogram February 24, 2016: ------------------------------------------------------------------- Study Conclusions   - Left ventricle: The cavity size was mildly dilated. There was   moderate focal basal hypertrophy. Systolic function was severely   reduced. The estimated ejection fraction was in the range of 25%   to 30%. Diffuse hypokinesis. There is akinesis of the   inferolateral myocardium. There is akinesis of the basalinferior   myocardium. - Aortic valve: Poorly visualized. Mildly calcified annulus.   Trileaflet; normal thickness leaflets. - Mitral valve: There was mild to moderate regurgitation. - Left atrium: The atrium was mildly dilated. - Right ventricle: The cavity size was moderately dilated. Wall   thickness was normal. Systolic function was moderately reduced. - Right atrium: The atrium was mildly dilated. - Pulmonic valve: Poorly visualized. - Pulmonary arteries: PA peak pressure: 39 mm Hg (S). - Pericardium, extracardiac: A trivial, free-flowing pericardial   effusion was identified at the apex. The fluid had no internal   echoes.   Impressions:   - The right ventricular systolic pressure was increased consistent   with mild pulmonary hypertension.   ASSESSMENT:    1.  Coronary artery disease involving native coronary artery  of native heart without angina pectoris   2. Paroxysmal atrial fibrillation (HCC)   3. Chronic systolic heart failure (HCC)   4. History of placement of internal cardiac defibrillator   5. Chronic anticoagulation   6. On amiodarone therapy      PLAN:  In order of problems listed above:  1. Last heart catheterization 2004.  No recent ischemic symptoms.  Presumed ischemic cardiomyopathy with AICD.  Denies angina and has not had an ischemic workup in quite some time.  We will [facility] to get [facility] from the [facility]. 2. Had cardioversion 12 months ago.  Still in sinus rhythm now.  Using Eliquis for anticoagulation. 3. On the basis of ischemic heart disease.  No evidence of volume overload.  Therapy includes beta-blocker and ARB.  Also on spironolactone.  Therapy could be further optimized if needed.  We will repeat a 2D Doppler echocardiogram to assess where we are currently. 4. Needs follow-up in the device clinic.  Has been followed at the [facility].  Power source change out 2013.  Implant 2006 at [facility]. 5. Is on apixaban without complications. 6. We discussed possible complications/side effects with amiodarone.  TSH and hepatic panel will be obtained on return.  Medical follow-up in 6 months.  Device clinic prior to then.  Obtain information from [facility].  Medication Adjustments/Labs and Tests Ordered: Current medicines are reviewed at length with the patient today.  Concerns regarding medicines are outlined above.  Medication changes, Labs and Tests ordered today are listed in the Patient Instructions below. Patient Instructions  Medication Instructions:  Your physician recommends that you continue on your current medications as directed. Please refer to the Current Medication list given to you today.  Labwork: None  Testing/Procedures: Your physician has requested that you  have an echocardiogram just prior to seeing Dr. Katrinka Blazing back in 6 months. Echocardiography is a painless test that uses sound waves to create images of your heart. It provides your doctor with information about the size and shape of your heart and how well your heart's chambers and valves are working. This procedure takes approximately one hour. There are no restrictions for this procedure.    Follow-Up: Dr. Katrinka Blazing would like for you to establish with our Electrophysiology team.  You will need to see an EP physician and device team.   Your physician wants you to follow-up in: 6 months with Dr. Katrinka Blazing. You will receive a reminder letter in the mail two months in advance. If you don't receive a letter, please call our office to schedule the follow-up appointment.   Any Other Special Instructions Will Be Listed Below (If Applicable).     If you need a refill on your cardiac medications before your next appointment, please call your pharmacy.      Signed, Lesleigh Noe, MD  03/24/2017 3:21 PM    [facility] [address] [place], [place], [place]  [address] Phone: [phone]; Fax: [phone]

## 2017-03-24 ENCOUNTER — Ambulatory Visit (INDEPENDENT_AMBULATORY_CARE_PROVIDER_SITE_OTHER): Payer: PPO | Admitting: Interventional Cardiology

## 2017-03-24 ENCOUNTER — Encounter: Payer: Self-pay | Admitting: Interventional Cardiology

## 2017-03-24 VITALS — BP 116/64 | HR 54 | Ht 71.0 in | Wt 242.0 lb

## 2017-03-24 DIAGNOSIS — I480 Paroxysmal atrial fibrillation: Secondary | ICD-10-CM

## 2017-03-24 DIAGNOSIS — I5022 Chronic systolic (congestive) heart failure: Secondary | ICD-10-CM | POA: Diagnosis not present

## 2017-03-24 DIAGNOSIS — Z79899 Other long term (current) drug therapy: Secondary | ICD-10-CM | POA: Diagnosis not present

## 2017-03-24 DIAGNOSIS — Z95810 Presence of automatic (implantable) cardiac defibrillator: Secondary | ICD-10-CM | POA: Diagnosis not present

## 2017-03-24 DIAGNOSIS — Z7901 Long term (current) use of anticoagulants: Secondary | ICD-10-CM

## 2017-03-24 DIAGNOSIS — I2510 Atherosclerotic heart disease of native coronary artery without angina pectoris: Secondary | ICD-10-CM

## 2017-03-24 NOTE — Patient Instructions (Signed)
Medication Instructions:  Your physician recommends that you continue on your current medications as directed. Please refer to the Current Medication list given to you today.  Labwork: None  Testing/Procedures: Your physician has requested that you have an echocardiogram just prior to seeing Dr. Katrinka Blazing back in 6 months. Echocardiography is a painless test that uses sound waves to create images of your heart. It provides your doctor with information about the size and shape of your heart and how well your heart's chambers and valves are working. This procedure takes approximately one hour. There are no restrictions for this procedure.    Follow-Up: Dr. Katrinka Blazing would like for you to establish with our Electrophysiology team.  You will need to see an EP physician and device team.   Your physician wants you to follow-up in: 6 months with Dr. Katrinka Blazing. You will receive a reminder letter in the mail two months in advance. If you don't receive a letter, please call our office to schedule the follow-up appointment.   Any Other Special Instructions Will Be Listed Below (If Applicable).     If you need a refill on your cardiac medications before your next appointment, please call your pharmacy.

## 2017-03-25 ENCOUNTER — Ambulatory Visit (INDEPENDENT_AMBULATORY_CARE_PROVIDER_SITE_OTHER): Payer: PPO | Admitting: Family Medicine

## 2017-03-25 ENCOUNTER — Other Ambulatory Visit: Payer: Self-pay

## 2017-03-25 VITALS — BP 118/64 | HR 64 | Temp 98.1°F | Ht 71.0 in | Wt 242.0 lb

## 2017-03-25 DIAGNOSIS — Z7689 Persons encountering health services in other specified circumstances: Secondary | ICD-10-CM | POA: Diagnosis not present

## 2017-03-25 DIAGNOSIS — H4450 Unspecified degenerated conditions of globe: Secondary | ICD-10-CM

## 2017-03-25 DIAGNOSIS — K089 Disorder of teeth and supporting structures, unspecified: Secondary | ICD-10-CM

## 2017-03-25 NOTE — Patient Instructions (Addendum)
  Please bring in a copy of your most recent blood work at your leisure, you can leave it at the front desk for me.  You can keep your [place] in the freezer to help with the burping and after-taste.   I have also referred you to an eye doctor and a dentist. You will receive a call from our office to schedule this appointment. If you do not hear from our office in 1-2 weeks please call us to check on the status of your referral at [phone].  Dolores Patty, DO PGY-2, [facility] 03/25/2017 10:35 AM

## 2017-03-25 NOTE — Progress Notes (Signed)
Subjective:    Patient ID: Max Rivas, male    DOB: December 20, 1950, 67 y.o.   MRN: [id number]   CC: establish care, needs referrals  Has a degenerative eye condition that last eye doctor told him would cause him to lose his vision. However he then went to an optometrist to get glasses and was told there were no issues with his eyes. He is concerned and would like second opinion. Denies loss of vision or changes in vision currently.  He also needs to see a dentist. Does not have dental benefits through [place]. Has no upper teeth and lower teeth are broken and painful after trauma to mouth years ago.   PMH- CAD, CHF, hx MI, afib on eliquis, dilated cardiomyopathy, hx PE, COPD, tobacco abuse, sleep apnea, GERD, OA, HLD, ?lung mass Meds  Current Outpatient Medications:  .  acetaminophen (TYLENOL) 325 MG tablet, Take 2 tablets (650 mg total) by mouth every 6 (six) hours as needed for mild pain (or Fever >/= 101)., Disp: 30 tablet, Rfl: 0 .  amiodarone (PACERONE) 200 MG tablet, Take 200 mg by mouth daily., Disp: , Rfl:  .  apixaban (ELIQUIS) 5 MG TABS tablet, Take 5 mg by mouth 2 (two) times daily., Disp: , Rfl:  .  carboxymethylcellulose (REFRESH PLUS) 0.5 % SOLN, Place 1 drop into both eyes at bedtime., Disp: , Rfl:  .  losartan (COZAAR) 25 MG tablet, Take 12.5 mg by mouth daily., Disp: , Rfl:  .  metoprolol succinate (TOPROL-XL) 50 MG 24 hr tablet, Take 75 mg by mouth 2 (two) times daily. Take with or immediately following a meal., Disp: , Rfl:  .  mometasone (ASMANEX) 220 MCG/INH inhaler, Inhale 2 puffs into the lungs 2 (two) times daily. , Disp: , Rfl:  .  Multiple Vitamin (MULTIVITAMIN WITH MINERALS) TABS tablet, Take 1 tablet by mouth daily., Disp: , Rfl:  .  nitroGLYCERIN (NITROSTAT) 0.4 MG SL tablet, Place 0.4 mg under the tongue every 5 (five) minutes x 3 doses as needed for chest pain., Disp: , Rfl:  .  omega-3 acid ethyl esters (LOVAZA) 1 g capsule, Take 1 capsule (1 g total) by mouth 2  (two) times daily., Disp: 60 capsule, Rfl: 0 .  Polyethyl Glycol-Propyl Glycol 0.4-0.3 % SOLN, Place 1 drop into both eyes 3 (three) times daily. (SYSTANE DROPS), Disp: , Rfl:  .  PRESCRIPTION MEDICATION, Inhale 2 puffs into the lungs every 6 (six) hours as needed (wheezing/shortness of breath). (LEVALBUTEROL INHALER), Disp: , Rfl:  .  rosuvastatin (CRESTOR) 40 MG tablet, Take 40 mg by mouth daily., Disp: , Rfl:  .  spironolactone (ALDACTONE) 25 MG tablet, Take 1 tablet (25 mg total) by mouth daily., Disp: 30 tablet, Rfl: 6 .  Tiotropium Bromide-Olodaterol (STIOLTO RESPIMAT) 2.5-2.5 MCG/ACT AERS, Inhale 2 puffs into the lungs 2 (two) times daily. , Disp: , Rfl:   Allergies- crestor (muscle pains) - however he is on crestor w/o issue at this time Surg Hx- pacemaker placed, CABG w/ stent x 5 FH- CAD/MI in mother, father, sister (MI at 7) SH- 3 packs a day hx, currently smoking 2-3 a day with plans to quit 3/23. Social drinks 1-2 a month. No drug use.  Smoking status reviewed-current smoker  Review of Systems- no CP, SOB, DOE, fevers, chills, unintentional weight loss   Objective:  BP 118/64   Pulse 64   Temp 98.1 F (36.7 C) (Oral)   Ht 5\' 11"  (1.803 m)   Wt  242 lb (109.8 kg)   SpO2 92%   BMI 33.75 kg/m  Vitals and nursing note reviewed  General: well nourished, in no acute distress HEENT: normocephalic, no scleral icterus or conjunctival pallor, no nasal discharge, moist mucous membranes, poor dentition without erythema or discharge noted in posterior oropharynx Neck: supple, non-tender, without lymphadenopathy Cardiac: RRR, clear S1 and S2, no murmurs, rubs, or gallops Respiratory: clear to auscultation bilaterally, no increased work of breathing Extremities: no edema or cyanosis. Skin: warm and dry, no rashes noted Neuro: alert and oriented, no focal deficits  Assessment & Plan:   1. Encounter to establish care Chart reviewed extensively. Patient established with cardiology  and will follow up with them in 6 months- will get echo and see device clinic as well prior to that. He recently had labs done at the [place]. Patient states he will bring a copy to [place] for documentation.   2. Degenerative disorder of eye Patient does not remember what eye condition he has but he was told he will lose his sight. Currently without vision complaints. - Ambulatory referral to Ophthalmology  3. Poor dentition Secondary to mouth trauma. Patient needs lower teeth pulled and dentures. - Ambulatory referral to Dentistry   Return in about 6 months (around 09/25/2017).   Dolores Patty, DO Family Medicine Resident PGY-2

## 2017-04-21 DIAGNOSIS — H353131 Nonexudative age-related macular degeneration, bilateral, early dry stage: Secondary | ICD-10-CM | POA: Diagnosis not present

## 2017-04-21 DIAGNOSIS — H40033 Anatomical narrow angle, bilateral: Secondary | ICD-10-CM | POA: Diagnosis not present

## 2017-05-25 DIAGNOSIS — N189 Chronic kidney disease, unspecified: Secondary | ICD-10-CM | POA: Insufficient documentation

## 2017-05-25 DIAGNOSIS — E039 Hypothyroidism, unspecified: Secondary | ICD-10-CM | POA: Insufficient documentation

## 2017-05-25 DIAGNOSIS — I482 Chronic atrial fibrillation, unspecified: Secondary | ICD-10-CM | POA: Insufficient documentation

## 2017-05-25 DIAGNOSIS — I420 Dilated cardiomyopathy: Secondary | ICD-10-CM

## 2017-05-25 DIAGNOSIS — I2510 Atherosclerotic heart disease of native coronary artery without angina pectoris: Secondary | ICD-10-CM

## 2017-05-25 DIAGNOSIS — I272 Pulmonary hypertension, unspecified: Secondary | ICD-10-CM | POA: Insufficient documentation

## 2017-05-25 DIAGNOSIS — J449 Chronic obstructive pulmonary disease, unspecified: Secondary | ICD-10-CM

## 2017-05-25 DIAGNOSIS — Z95810 Presence of automatic (implantable) cardiac defibrillator: Secondary | ICD-10-CM | POA: Insufficient documentation

## 2017-05-25 DIAGNOSIS — G4733 Obstructive sleep apnea (adult) (pediatric): Secondary | ICD-10-CM | POA: Insufficient documentation

## 2017-05-25 DIAGNOSIS — J441 Chronic obstructive pulmonary disease with (acute) exacerbation: Secondary | ICD-10-CM

## 2017-06-24 ENCOUNTER — Encounter: Payer: Self-pay | Admitting: Internal Medicine

## 2017-06-24 ENCOUNTER — Ambulatory Visit: Payer: PPO | Admitting: Internal Medicine

## 2017-06-24 VITALS — BP 134/74 | HR 62 | Ht 71.0 in | Wt 253.0 lb

## 2017-06-24 DIAGNOSIS — I480 Paroxysmal atrial fibrillation: Secondary | ICD-10-CM | POA: Diagnosis not present

## 2017-06-24 DIAGNOSIS — I5022 Chronic systolic (congestive) heart failure: Secondary | ICD-10-CM | POA: Diagnosis not present

## 2017-06-24 DIAGNOSIS — Z95810 Presence of automatic (implantable) cardiac defibrillator: Secondary | ICD-10-CM

## 2017-06-24 LAB — CUP PACEART INCLINIC DEVICE CHECK
Date Time Interrogation Session: 20190613112418
Implantable Lead Implant Date: 20050705
Implantable Lead Location: 753860
Implantable Lead Model: 6935
Implantable Lead Model: 6949
Lead Channel Setting Pacing Amplitude: 3 V
Lead Channel Setting Pacing Pulse Width: 0.4 ms
Lead Channel Setting Sensing Sensitivity: 0.3 mV
MDC IDC LEAD IMPLANT DT: 20130307
MDC IDC LEAD LOCATION: 753860
MDC IDC MSMT LEADCHNL RV PACING THRESHOLD AMPLITUDE: 1.5 V
MDC IDC MSMT LEADCHNL RV PACING THRESHOLD PULSEWIDTH: 0.4 ms
MDC IDC MSMT LEADCHNL RV SENSING INTR AMPL: 6.8 mV
MDC IDC PG IMPLANT DT: 20130307

## 2017-06-24 NOTE — Progress Notes (Addendum)
HPI Max Rivas is referred today after a long absence from our arrhythmia clinic. He is a pleasant 67 yo man with a h/o chronic systolic heart failure, CAD, s/p MI, HTN, obesity and PAF. He underwent an initial ICD 15 years ago and developed a 6949 lead failure and underwent insertion of a new ICD lead and gen change out at the [facility] and presents now to continue his followup. He has had a single ICD shock in the past. He has been placed on amiodarone for PAF after undergoing DCCV about one year ago. He is trying to stop smoking and is down to 2-3 a day. Allergies  Allergen Reactions  . Crestor [Rosuvastatin Calcium] Other (See Comments)    Back spasms     Current Outpatient Medications  Medication Sig Dispense Refill  . acetaminophen (TYLENOL) 325 MG tablet Take 2 tablets (650 mg total) by mouth every 6 (six) hours as needed for mild pain (or Fever >/= 101). 30 tablet 0  . amiodarone (PACERONE) 200 MG tablet Take 200 mg by mouth daily.    Marland Kitchen apixaban (ELIQUIS) 5 MG TABS tablet Take 5 mg by mouth 2 (two) times daily.    . carboxymethylcellulose (REFRESH PLUS) 0.5 % SOLN Place 1 drop into both eyes at bedtime.    Marland Kitchen losartan (COZAAR) 25 MG tablet Take 12.5 mg by mouth daily.    . metoprolol succinate (TOPROL-XL) 50 MG 24 hr tablet Take 75 mg by mouth 2 (two) times daily. Take with or immediately following a meal.    . mometasone (ASMANEX) 220 MCG/INH inhaler Inhale 2 puffs into the lungs 2 (two) times daily.     . Multiple Vitamin (MULTIVITAMIN WITH MINERALS) TABS tablet Take 1 tablet by mouth daily.    . nitroGLYCERIN (NITROSTAT) 0.4 MG SL tablet Place 0.4 mg under the tongue every 5 (five) minutes x 3 doses as needed for chest pain.    Marland Kitchen omega-3 acid ethyl esters (LOVAZA) 1 g capsule Take 1 capsule (1 g total) by mouth 2 (two) times daily. 60 capsule 0  . Polyethyl Glycol-Propyl Glycol 0.4-0.3 % SOLN Place 1 drop into both eyes 3 (three) times daily. (SYSTANE DROPS)    .  PRESCRIPTION MEDICATION Inhale 2 puffs into the lungs every 6 (six) hours as needed (wheezing/shortness of breath). (LEVALBUTEROL INHALER)    . rosuvastatin (CRESTOR) 40 MG tablet Take 40 mg by mouth daily.    Marland Kitchen spironolactone (ALDACTONE) 25 MG tablet Take 1 tablet (25 mg total) by mouth daily. 30 tablet 6  . Tiotropium Bromide-Olodaterol (STIOLTO RESPIMAT) 2.5-2.5 MCG/ACT AERS Inhale 2 puffs into the lungs 2 (two) times daily.      No current facility-administered medications for this visit.      Past Medical History:  Diagnosis Date  . Arthritis    "elbows, knees" (02/24/2016)  . CHF (congestive heart failure) (HCC)   . COPD (chronic obstructive pulmonary disease) (HCC)   . Coronary artery disease   . GERD (gastroesophageal reflux disease)   . High cholesterol   . Myocardial infarction [facility]) ~ 1995 X 2;1998; 2000; 2004  . On home oxygen therapy    "2L w/activity" (02/24/2016)  . Pacemaker   . Pulmonary embolism (HCC) 02/24/2016  . Sleep apnea    "have CPAP; can't tolerate it" (02/24/2016)    ROS:   All systems reviewed and negative except as noted in the HPI.   Past Surgical History:  Procedure Laterality Date  . CORONARY  ANGIOPLASTY  1995  . CORONARY ANGIOPLASTY WITH STENT PLACEMENT  ~ 1995 - 2004 X 5   "I've got a total of 5 stents" (02/24/2016)  . 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 PACEMAKER GENERATOR CHANGE  03/2011    [facility]  . TONSILLECTOMY       Family History  Problem Relation Age of Onset  . Heart attack Mother   . Heart attack Father   . Heart attack Sister 69     Social History   Socioeconomic History  . Marital status: Divorced    Spouse name: Not on file  . Number of children: Not on file  . Years of education: Not on file  . Highest education level: Not on file  Occupational History  . Not on file  Social Needs  . Financial resource strain: Not on file  . Food insecurity:     Worry: Not on file    Inability: Not on file  . Transportation needs:    Medical: Not on file    Non-medical: Not on file  Tobacco Use  . Smoking status: Current Some Day Smoker    Packs/day: 3.00    Years: 35.00    Pack years: 105.00    Types: Cigarettes  . Smokeless tobacco: Never Used  . Tobacco comment: 02/24/2016 "stopped ~ 8-9 months ago; have had 5 cigarettes in the last week"  Substance and Sexual Activity  . Alcohol use: No    Comment: 02/24/2016 "none in a long time"  . Drug use: Yes    Types: Cocaine    Comment: 02/24/2016 "none since 1995"  . Sexual activity: Yes  Lifestyle  . Physical activity:    Days per week: Not on file    Minutes per session: Not on file  . Stress: Not on file  Relationships  . Social connections:    Talks on phone: Not on file    Gets together: Not on file    Attends religious service: Not on file    Active member of club or organization: Not on file    Attends meetings of clubs or organizations: Not on file    Relationship status: Not on file  . Intimate partner violence:    Fear of current or ex partner: Not on file    Emotionally abused: Not on file    Physically abused: Not on file    Forced sexual activity: Not on file  Other Topics Concern  . Not on file  Social History Narrative  . Not on file     BP 134/74   Pulse 62   Ht 5\' 11"  (1.803 m)   Wt 253 lb (114.8 kg)   BMI 35.29 kg/m   Physical Exam:  Well appearing NAD HEENT: Unremarkable Neck:  No JVD, no thyromegally Lymphatics:  No adenopathy Back:  No CVA tenderness Lungs:  Clear with no wheezes HEART:  Regular rate rhythm, no murmurs, no rubs, no clicks Abd:  soft, positive bowel sounds, no organomegally, no rebound, no guarding Ext:  2 plus pulses, no edema, no cyanosis, no clubbing Skin:  No rashes no nodules Neuro:  CN II through XII intact, motor grossly intact  EKG - NSR with old inferior MI  DEVICE  Normal device function.  See PaceArt for details.    Assess/Plan: 1. Chronic systolic heart failure - his symptoms are class 2. He will continue his  current meds. 2. Obesity - I have encouraged the patient to lose weight. 3. PAF - he is maintaining NSR. When he returns, I will consider reducing his dose of amiodarone. 4. ICD - his medtronic ICD is working normally. Will recheck in several months. We will get him on remote monitoring.  5. Tobacco abuse - he will continue to try and wean himself off the cigarettes. He is using a patch.  Leonia Reeves.D.

## 2017-06-24 NOTE — Patient Instructions (Signed)

## 2017-09-16 ENCOUNTER — Ambulatory Visit (HOSPITAL_COMMUNITY): Payer: PPO

## 2017-09-23 ENCOUNTER — Encounter: Payer: Medicare PPO | Admitting: *Deleted

## 2017-09-23 ENCOUNTER — Other Ambulatory Visit (HOSPITAL_COMMUNITY): Payer: Non-veteran care

## 2017-12-07 ENCOUNTER — Encounter: Payer: Self-pay | Admitting: Cardiology

## 2018-03-15 ENCOUNTER — Ambulatory Visit (INDEPENDENT_AMBULATORY_CARE_PROVIDER_SITE_OTHER): Payer: Medicare Other | Admitting: *Deleted

## 2018-03-15 DIAGNOSIS — I5022 Chronic systolic (congestive) heart failure: Secondary | ICD-10-CM | POA: Diagnosis not present

## 2018-03-15 DIAGNOSIS — I428 Other cardiomyopathies: Secondary | ICD-10-CM

## 2018-03-15 DIAGNOSIS — I429 Cardiomyopathy, unspecified: Secondary | ICD-10-CM

## 2018-03-18 LAB — CUP PACEART REMOTE DEVICE CHECK
Brady Statistic RV Percent Paced: 0.03 %
Date Time Interrogation Session: 20200303051808
HIGH POWER IMPEDANCE MEASURED VALUE: 247 Ohm
HighPow Impedance: 80 Ohm
HighPow Impedance: 88 Ohm
Implantable Lead Implant Date: 20130307
Implantable Lead Location: 753860
Implantable Lead Model: 6935
Implantable Lead Model: 6949
Implantable Pulse Generator Implant Date: 20130307
Lead Channel Impedance Value: 323 Ohm
Lead Channel Pacing Threshold Amplitude: 1.25 V
Lead Channel Pacing Threshold Pulse Width: 0.4 ms
Lead Channel Setting Sensing Sensitivity: 0.3 mV
MDC IDC LEAD IMPLANT DT: 20050705
MDC IDC LEAD LOCATION: 753860
MDC IDC MSMT BATTERY VOLTAGE: 2.89 V
MDC IDC MSMT LEADCHNL RV SENSING INTR AMPL: 6.375 mV
MDC IDC MSMT LEADCHNL RV SENSING INTR AMPL: 6.375 mV
MDC IDC SET LEADCHNL RV PACING AMPLITUDE: 2.75 V
MDC IDC SET LEADCHNL RV PACING PULSEWIDTH: 0.4 ms

## 2018-03-22 NOTE — Progress Notes (Signed)
Remote ICD transmission.   

## 2018-03-25 ENCOUNTER — Ambulatory Visit: Payer: Self-pay | Admitting: Emergency Medicine

## 2018-04-08 ENCOUNTER — Telehealth: Payer: Self-pay | Admitting: Cardiology

## 2018-04-08 NOTE — Telephone Encounter (Signed)
Spoke w/ pt and confirmed that he wanted his information released to [place]. Released in care, marked inactive in paceart, all upcoming remote appts cancelled.

## 2018-06-02 ENCOUNTER — Encounter (HOSPITAL_COMMUNITY): Payer: Self-pay | Admitting: Emergency Medicine

## 2018-06-02 ENCOUNTER — Emergency Department (HOSPITAL_COMMUNITY): Payer: No Typology Code available for payment source

## 2018-06-02 ENCOUNTER — Emergency Department (HOSPITAL_COMMUNITY)
Admission: EM | Admit: 2018-06-02 | Discharge: 2018-06-02 | Discharge status: Against Medical Advice | Payer: No Typology Code available for payment source | Attending: Emergency Medicine | Admitting: Emergency Medicine

## 2018-06-02 ENCOUNTER — Other Ambulatory Visit: Payer: Self-pay

## 2018-06-02 ENCOUNTER — Ambulatory Visit (INDEPENDENT_AMBULATORY_CARE_PROVIDER_SITE_OTHER)
Admission: EM | Admit: 2018-06-02 | Discharge: 2018-06-02 | Disposition: A | Discharge status: Home / Self Care | Payer: No Typology Code available for payment source | Source: Home / Self Care | Attending: Family Medicine | Admitting: Family Medicine

## 2018-06-02 ENCOUNTER — Ambulatory Visit (INDEPENDENT_AMBULATORY_CARE_PROVIDER_SITE_OTHER): Payer: No Typology Code available for payment source

## 2018-06-02 DIAGNOSIS — J189 Pneumonia, unspecified organism: Secondary | ICD-10-CM | POA: Diagnosis not present

## 2018-06-02 DIAGNOSIS — Z5320 Procedure and treatment not carried out because of patient's decision for unspecified reasons: Secondary | ICD-10-CM | POA: Insufficient documentation

## 2018-06-02 DIAGNOSIS — Z7901 Long term (current) use of anticoagulants: Secondary | ICD-10-CM | POA: Diagnosis not present

## 2018-06-02 DIAGNOSIS — Z86711 Personal history of pulmonary embolism: Secondary | ICD-10-CM | POA: Insufficient documentation

## 2018-06-02 DIAGNOSIS — J9601 Acute respiratory failure with hypoxia: Secondary | ICD-10-CM | POA: Diagnosis not present

## 2018-06-02 DIAGNOSIS — R062 Wheezing: Secondary | ICD-10-CM

## 2018-06-02 DIAGNOSIS — Z20828 Contact with and (suspected) exposure to other viral communicable diseases: Secondary | ICD-10-CM | POA: Insufficient documentation

## 2018-06-02 DIAGNOSIS — R05 Cough: Secondary | ICD-10-CM

## 2018-06-02 DIAGNOSIS — F17210 Nicotine dependence, cigarettes, uncomplicated: Secondary | ICD-10-CM | POA: Insufficient documentation

## 2018-06-02 DIAGNOSIS — R0602 Shortness of breath: Secondary | ICD-10-CM | POA: Diagnosis not present

## 2018-06-02 DIAGNOSIS — R5383 Other fatigue: Secondary | ICD-10-CM | POA: Diagnosis not present

## 2018-06-02 DIAGNOSIS — J441 Chronic obstructive pulmonary disease with (acute) exacerbation: Secondary | ICD-10-CM | POA: Diagnosis not present

## 2018-06-02 DIAGNOSIS — I2510 Atherosclerotic heart disease of native coronary artery without angina pectoris: Secondary | ICD-10-CM | POA: Insufficient documentation

## 2018-06-02 DIAGNOSIS — Z79899 Other long term (current) drug therapy: Secondary | ICD-10-CM | POA: Diagnosis not present

## 2018-06-02 DIAGNOSIS — Z950 Presence of cardiac pacemaker: Secondary | ICD-10-CM | POA: Diagnosis not present

## 2018-06-02 DIAGNOSIS — Z720 Tobacco use: Secondary | ICD-10-CM

## 2018-06-02 DIAGNOSIS — I252 Old myocardial infarction: Secondary | ICD-10-CM | POA: Insufficient documentation

## 2018-06-02 DIAGNOSIS — Z20822 Contact with and (suspected) exposure to covid-19: Secondary | ICD-10-CM

## 2018-06-02 LAB — CBC WITH DIFFERENTIAL/PLATELET
Abs Immature Granulocytes: 0.03 10*3/uL (ref 0.00–0.07)
Basophils Absolute: 0 10*3/uL (ref 0.0–0.1)
Basophils Relative: 0 %
Eosinophils Absolute: 0.1 10*3/uL (ref 0.0–0.5)
Eosinophils Relative: 1 %
HCT: 51.8 % (ref 39.0–52.0)
Hemoglobin: 17.3 g/dL — ABNORMAL HIGH (ref 13.0–17.0)
Immature Granulocytes: 0 %
Lymphocytes Relative: 28 %
Lymphs Abs: 2.4 10*3/uL (ref 0.7–4.0)
MCH: 31.1 pg (ref 26.0–34.0)
MCHC: 33.4 g/dL (ref 30.0–36.0)
MCV: 93.2 fL (ref 80.0–100.0)
Monocytes Absolute: 0.8 10*3/uL (ref 0.1–1.0)
Monocytes Relative: 9 %
Neutro Abs: 5.3 10*3/uL (ref 1.7–7.7)
Neutrophils Relative %: 62 %
Platelets: 174 10*3/uL (ref 150–400)
RBC: 5.56 MIL/uL (ref 4.22–5.81)
RDW: 15 % (ref 11.5–15.5)
WBC: 8.6 10*3/uL (ref 4.0–10.5)
nRBC: 0 % (ref 0.0–0.2)

## 2018-06-02 LAB — BASIC METABOLIC PANEL
Anion gap: 15 (ref 5–15)
BUN: 14 mg/dL (ref 8–23)
CO2: 26 mmol/L (ref 22–32)
Calcium: 9.2 mg/dL (ref 8.9–10.3)
Chloride: 97 mmol/L — ABNORMAL LOW (ref 98–111)
Creatinine, Ser: 1.31 mg/dL — ABNORMAL HIGH (ref 0.61–1.24)
GFR calc Af Amer: >60 mL/min (ref >60)
GFR calc non Af Amer: 56 mL/min — ABNORMAL LOW (ref >60)
Glucose, Bld: 139 mg/dL — ABNORMAL HIGH (ref 70–99)
Potassium: 4.3 mmol/L (ref 3.5–5.1)
Sodium: 138 mmol/L (ref 135–145)

## 2018-06-02 LAB — SARS CORONAVIRUS 2 BY RT PCR (HOSPITAL ORDER, PERFORMED IN ~~LOC~~ HOSPITAL LAB): SARS Coronavirus 2: NEGATIVE

## 2018-06-02 LAB — BRAIN NATRIURETIC PEPTIDE: B Natriuretic Peptide: 250.4 pg/mL — ABNORMAL HIGH (ref 0.0–100.0)

## 2018-06-02 LAB — TROPONIN I: Troponin I: <0.03 ng/mL (ref <0.03)

## 2018-06-02 MED ORDER — METHYLPREDNISOLONE SODIUM SUCC 125 MG IJ SOLR
125.0000 mg | Freq: Once | INTRAMUSCULAR | Status: AC
  Administered 2018-06-02: 125 mg via INTRAVENOUS
  Filled 2018-06-02: qty 2

## 2018-06-02 MED ORDER — DOXYCYCLINE HYCLATE 100 MG PO CAPS: 100.0000 mg | ORAL_CAPSULE | Freq: Two times a day (BID) | ORAL | 0 refills | Status: DC

## 2018-06-02 MED ORDER — AEROCHAMBER Z-STAT PLUS/MEDIUM MISC
1.0000 | Freq: Once | Status: AC
  Administered 2018-06-02: 1
  Filled 2018-06-02: qty 1

## 2018-06-02 MED ORDER — ALBUTEROL SULFATE HFA 108 (90 BASE) MCG/ACT IN AERS: 4.0000 | INHALATION_SPRAY | Freq: Once | RESPIRATORY_TRACT | Status: DC

## 2018-06-02 MED ORDER — DOXYCYCLINE HYCLATE 100 MG PO TABS
100.0000 mg | ORAL_TABLET | Freq: Once | ORAL | Status: AC
  Administered 2018-06-02: 21:00:00 100 mg via ORAL
  Filled 2018-06-02: qty 1

## 2018-06-02 MED ORDER — PREDNISONE 10 MG (21) PO TBPK: ORAL_TABLET | ORAL | 0 refills | Status: DC

## 2018-06-02 MED ORDER — LEVALBUTEROL TARTRATE 45 MCG/ACT IN AERO
4.0000 | INHALATION_SPRAY | Freq: Once | RESPIRATORY_TRACT | Status: AC
  Administered 2018-06-02: 4 via RESPIRATORY_TRACT
  Filled 2018-06-02: qty 15

## 2018-06-02 NOTE — ED Notes (Signed)
Pt aware he is leaving AMA, MD Haviland and this RN have spoken to pt about the risks of leaving and when to seek medical attention.

## 2018-06-02 NOTE — ED Provider Notes (Signed)
[name] EMERGENCY DEPARTMENT Provider Note   CSN: [id number] Arrival date & time: 06/02/18  1747    History   Chief Complaint No chief complaint on file.   HPI Max Rivas is a 68 y.o. male.     Pt presents to the ED today from UC with sob and hypoxia.  Pt said he has had a cough for the past 5 days.  He has taken Mucinex without improvement of sx.  When he went to UC, his RA O2 sats were 82%.  Pt had been on home O2 in the past, but the [place] took it away when his oxygen improved.  He denies any known exposure to Covid.  He has been staying at home.  No f/c.  He feels like he has pneumonia.     Past Medical History:  Diagnosis Date   Arthritis    "elbows, knees" (02/24/2016)   CHF (congestive heart failure) (HCC)    COPD (chronic obstructive pulmonary disease) (HCC)    Coronary artery disease    GERD (gastroesophageal reflux disease)    High cholesterol    Myocardial infarction (HCC) ~ 1995 X 2;1998; 2000; 2004   On home oxygen therapy    "2L w/activity" (02/24/2016)   Pacemaker    Pulmonary embolism (HCC) 02/24/2016   Sleep apnea    "have CPAP; can't tolerate it" (02/24/2016)    Patient Active Problem List   Diagnosis Date Noted   DCM (dilated cardiomyopathy) (HCC) 05/25/2017   Coronary artery disease involving native coronary artery of native heart without angina pectoris 05/25/2017   Chronic atrial fibrillation 05/25/2017   COPD exacerbation (HCC) 05/25/2017   CKD (chronic kidney disease) 05/25/2017   Hypothyroidism 05/25/2017   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/2017   OSA (obstructive sleep apnea) 05/25/2017   Pulmonary HTN (HCC) 05/25/2017   Acute on chronic respiratory failure with hypoxia (HCC)    Mediastinal adenopathy    Cervical adenopathy    Acute respiratory failure with hypoxia (HCC) 02/24/2016   Pulmonary embolus (HCC) 02/24/2016   Pulmonary artery thrombosis (HCC)     Pleural effusion    Hypoxemia    Lung mass    Pleural plaque    Flutter-fibrillation (HCC) 01/15/2016   Acute systolic congestive heart failure (HCC)    Atrial fibrillation with RVR (HCC)    Hypercholesterolemia 03/11/2006   TOBACCO DEPENDENCE 03/11/2006   Acute on chronic systolic congestive heart failure (HCC) 03/11/2006   GASTROESOPHAGEAL REFLUX, NO ESOPHAGITIS 03/11/2006   OSTEOARTHRITIS, MULTI SITES 03/11/2006   HIGH RISK PATIENT 03/11/2006   Tobacco dependence 03/11/2006    Past Surgical History:  Procedure Laterality Date   CORONARY ANGIOPLASTY  1995   CORONARY ANGIOPLASTY WITH STENT PLACEMENT  ~ 1995 - 2004 X 5   "I've got a total of 5 stents" (02/24/2016)   INSERT / REPLACE / REMOVE PACEMAKER  07/2003   original PPM around 2004 for ICM with EF < 35%   PACEMAKER [facility]ATOR CHANGE  03/[facility]    [facility] [place]   TONSILLECTOMY          Home Medications    Prior to Admission medications   Medication Sig Start Date End Date Taking? Authorizing Provider  acetaminophen (TYLENOL) 325 MG tablet Take 2 tablets (650 mg total) by mouth every 6 (six) hours as needed for mild pain (or Fever >/= 101). 03/05/16   Alison Murray, MD  amiodarone (PACERONE) 200 MG tablet Take 200 mg by mouth daily.  [provider]  apixaban (ELIQUIS) 5 MG TABS tablet Take 5 mg by mouth 2 (two) times daily.    [provider]  carboxymethylcellulose (REFRESH PLUS) 0.5 % SOLN Place 1 drop into both eyes at bedtime.    [provider]  doxycycline (VIBRAMYCIN) 100 MG capsule Take 1 capsule (100 mg total) by mouth 2 (two) times daily. 06/02/18   Jacalyn Lefevre, MD  Levothyroxine Sodium (LEVOTHROID PO) Take by mouth.    [provider]  losartan (COZAAR) 25 MG tablet Take 12.5 mg by mouth daily.    [provider]  metoprolol succinate (TOPROL-XL) 50 MG 24 hr tablet Take 75 mg by mouth 2 (two) times daily. Take with or immediately following a meal.     [provider]  mometasone (ASMANEX) 220 MCG/INH inhaler Inhale 2 puffs into the lungs 2 (two) times daily.     [provider]  Multiple Vitamin (MULTIVITAMIN WITH MINERALS) TABS tablet Take 1 tablet by mouth daily.    [provider]  nitroGLYCERIN (NITROSTAT) 0.4 MG SL tablet Place 0.4 mg under the tongue every 5 (five) minutes x 3 doses as needed for chest pain.    [provider]  omega-3 acid ethyl esters (LOVAZA) 1 g capsule Take 1 capsule (1 g total) by mouth 2 (two) times daily. 03/05/16   Alison Murray, MD  Polyethyl Glycol-Propyl Glycol 0.4-0.3 % SOLN Place 1 drop into both eyes 3 (three) times daily. (SYSTANE DROPS)    [provider]  predniSONE (STERAPRED UNI-PAK 21 TAB) 10 MG (21) TBPK tablet Take 6 tabs for 2 days, then 5 for 2 days, then 4 for 2 days, then 3 for 2 days, 2 for 2 days, then 1 for 2 days 06/02/18   Jacalyn Lefevre, MD  PRESCRIPTION MEDICATION Inhale 2 puffs into the lungs every 6 (six) hours as needed (wheezing/shortness of breath). (LEVALBUTEROL INHALER)    [provider]  rosuvastatin (CRESTOR) 40 MG tablet Take 40 mg by mouth daily.    [provider]  spironolactone (ALDACTONE) 25 MG tablet Take 1 tablet (25 mg total) by mouth daily. 03/06/16   Graciella Freer, PA-C  Tiotropium Bromide-Olodaterol (STIOLTO RESPIMAT) 2.5-2.5 MCG/ACT AERS Inhale 2 puffs into the lungs 2 (two) times daily.     [provider]    Family History Family History  Problem Relation Age of Onset   Heart attack Mother    Heart attack Father    Heart attack Sister 26    Social History Social History   Tobacco Use   Smoking status: Current Some Day Smoker    Packs/day: 3.00    Years: 35.00    Pack years: 105.00    Types: Cigarettes   Smokeless tobacco: Never Used   Tobacco comment: 02/24/2016 "stopped ~ 8-9 months ago; have had 5 cigarettes in the last week"  Substance Use Topics   Alcohol use:  No    Comment: 02/24/2016 "none in a long time"   Drug use: Yes    Types: Cocaine    Comment: 02/24/2016 "none since 1995"     Allergies   Crestor [rosuvastatin calcium]   Review of Systems Review of Systems  Respiratory: Positive for cough, shortness of breath and wheezing.   All other systems reviewed and are negative.    Physical Exam Updated Vital Signs BP (!) 123/59    Pulse (!) 58    Temp 98 F (36.7 C) (Oral)    Resp 16  Ht  (1.803 m)    Wt 111.1 kg    SpO2 95%    BMI 34.17 kg/m   Physical Exam Vitals signs and nursing note reviewed.  Constitutional:      Appearance: Normal appearance.  HENT:     Head: Normocephalic and atraumatic.     Right Ear: External ear normal.     Left Ear: External ear normal.     Nose: Nose normal.     Mouth/Throat:     Mouth: Mucous membranes are moist.     Pharynx: Oropharynx is clear.  Eyes:     Conjunctiva/sclera: Conjunctivae normal.     Pupils: Pupils are equal, round, and reactive to light.  Neck:     Musculoskeletal: Normal range of motion and neck supple.  Cardiovascular:     Rate and Rhythm: Normal rate and regular rhythm.     Pulses: Normal pulses.     Heart sounds: Normal heart sounds.  Pulmonary:     Effort: Pulmonary effort is normal.     Breath sounds: Wheezing present.  Abdominal:     General: Abdomen is flat. Bowel sounds are normal.     Palpations: Abdomen is soft.  Musculoskeletal: Normal range of motion.  Skin:    General: Skin is warm.     Capillary Refill: Capillary refill takes less than 2 seconds.  Neurological:     General: No focal deficit present.     Mental Status: He is alert and oriented to person, place, and time.  Psychiatric:        Mood and Affect: Mood normal.        Behavior: Behavior normal.      ED Treatments / Results  Labs (all labs ordered are listed, but only abnormal results are displayed) Labs Reviewed  BASIC METABOLIC PANEL - Abnormal; Notable for the following  components:      Result Value   Chloride 97 (*)    Glucose, Bld 139 (*)    Creatinine, Ser 1.31 (*)    GFR calc non Af Amer 56 (*)    All other components within normal limits  CBC WITH DIFFERENTIAL/PLATELET - Abnormal; Notable for the following components:   Hemoglobin 17.3 (*)    All other components within normal limits  BRAIN NATRIURETIC PEPTIDE - Abnormal; Notable for the following components:   B Natriuretic Peptide 250.4 (*)    All other components within normal limits  SARS CORONAVIRUS 2 (HOSPITAL ORDER, PERFORMED IN [facility] LAB)  TROPONIN I    EKG EKG Interpretation  Date/Time:  Thursday Jun 02 2018 17:49:17 EDT Ventricular Rate:  64 PR Interval:  172 QRS Duration: 100 QT Interval:  452 QTC Calculation: 466 R Axis:   81 Text Interpretation:  Normal sinus rhythm Low voltage QRS Abnormal QRS-T angle, consider primary T wave abnormality Abnormal ECG No significant change since last tracing Confirmed by Jacalyn Lefevre [phone]) on 06/02/2018 6:22:56 PM   Radiology Dg Chest 2 View  Result Date: 06/02/2018 CLINICAL DATA:  Short of breath and productive cough EXAM: CHEST - 2 VIEW COMPARISON:  02/25/2016 FINDINGS: Left subclavian AICD device is stable. Leads are intact. Normal heart size. Bibasilar heterogeneous opacities and haziness have improved indicating improved pleural effusions and airspace opacities. Pleural thickening and pleural calcifications in the right hemithorax are stable indicating some degree of chronic disease. Vascular congestion has also improved. No sign of interstitial edema. No pneumothorax. IMPRESSION: Bilateral pleural effusions and bibasilar pulmonary opacities have improved. Vascular  congestion persists but is improved. Electronically Signed   By: Jolaine Click M.D.   On: 06/02/2018 17:16   Dg Chest Port 1 View  Result Date: 06/02/2018 CLINICAL DATA:  68 year old male shortness of breath for 1 week, progressive. COVID-19\. Status pending.  EXAM: PORTABLE CHEST 1 VIEW COMPARISON:  1658 hours today. FINDINGS: Portable AP semi upright view at 1908 hours. There is asymmetric increased interstitial opacity in the mid and lower lungs. As before, no pneumothorax or suspected pulmonary edema. Chronic blunting of the right costophrenic angle suspected rather than small acute effusion. Stable cardiac size and mediastinal contours. Left chest AICD. Visualized tracheal air column is within normal limits. IMPRESSION: Stable asymmetric bilateral mid and lower lung pulmonary interstitial opacity suspicious for acute viral respiratory infection in this clinical setting. Electronically Signed   By: Odessa Fleming M.D.   On: 06/02/2018 19:47    Procedures Procedures (including critical care time)  Medications Ordered in ED Medications  methylPREDNISolone sodium succinate (SOLU-MEDROL) 125 mg/2 mL injection 125 mg (125 mg Intravenous Given 06/02/18 1848)  levalbuterol (XOPENEX HFA) inhaler 4 puff (4 puffs Inhalation Given 06/02/18 1915)  aerochamber Z-Stat Plus/medium 1 each (1 each Other Given by Other 06/02/18 1929)  doxycycline (VIBRA-TABS) tablet 100 mg (100 mg Oral Given 06/02/18 2100)     Initial Impression / Assessment and Plan / ED Course  I have reviewed the triage vital signs and the nursing notes.  Pertinent labs & imaging results that were available during my care of the patient were reviewed by me and considered in my medical decision making (see chart for details).    Pt is hypoxic with pneumonia.  covid negative.  Pt placed on oxygen upon arrival.  I told pt he needed to stay for oxygen and IV abx.  He refused and started taking off all of his leads and oxygen.  He refuses to stay even though I told him that his oxygen is so low that he could die if he goes home.  He did not want to stay for IV abx.  [facility]  He wants to go to that appt, not stay here.  Pt will sign out AMA.  Pt encouraged to return  at any time.  CRITICAL CARE Performed by: Jacalyn Lefevre   Total critical care time: 30 minutes  Critical care time was exclusive of separately billable procedures and treating other patients.  Critical care was necessary to treat or prevent imminent or life-threatening deterioration.  Critical care was time spent personally by me on the following activities: development of treatment plan with patient and/or surrogate as well as nursing, discussions with consultants, evaluation of patient's response to treatment, examination of patient, obtaining history from patient or surrogate, ordering and performing treatments and interventions, ordering and review of laboratory studies, ordering and review of radiographic studies, pulse oximetry and re-evaluation of patient's condition.  Max Rivas was evaluated in Emergency Department on 06/02/2018 for the symptoms described in the history of present illness. He was evaluated in the context of the global COVID-19 pandemic, which necessitated consideration that the patient might be at risk for infection with the SARS-CoV-2 virus that causes COVID-19. Institutional protocols and algorithms that pertain to the evaluation of patients at risk for COVID-19 are in a state of rapid change based on information released by regulatory bodies including the CDC and federal and state organizations. These policies and algorithms were followed during the patient's  care in the ED.  Final Clinical Impressions(s) / ED Diagnoses   Final diagnoses:  Acute respiratory failure with hypoxia (HCC)  COPD exacerbation (HCC)  Community acquired pneumonia, unspecified laterality  Covid-19 Virus not Detected  Tobacco abuse    ED Discharge Orders         Ordered    predniSONE (STERAPRED UNI-PAK 21 TAB) 10 MG (21) TBPK tablet     06/02/18 2019    doxycycline (VIBRAMYCIN) 100 MG capsule  2 times daily     06/02/18 2019           Jacalyn Lefevre, MD 06/02/18 2158

## 2018-06-02 NOTE — ED Triage Notes (Signed)
started feeling bad Sunday afternoon, sweating, sob Cough started last night.  Patient reports yellow phlegm Denies fever.

## 2018-06-02 NOTE — ED Triage Notes (Signed)
Pt reports SOB Sunday night after working in the yard, was seen at [facility] today due to it not resolving, sent here by UC due to Spo2 of 82% on RA. Pt on 2L upon arrival.

## 2018-06-02 NOTE — ED Provider Notes (Signed)
[facility]    CSN: [id number] Arrival date & time: 06/02/18  1601     History   Chief Complaint Chief Complaint  Patient presents with  . URI    HPI Max Rivas is a 68 y.o. male history of CAD, CHF, pacemaker in place, previous PE, COPD, CKD, presenting today for evaluation of cough.  Patient notes that on Sunday, approximately 5 days ago he began to feel under the weather.  He initially attributed this to flu and describes the symptoms as being flulike.  His started off with a mild productive cough.  He took some Mucinex over the next couple days and felt like his symptoms have worsened.  His cough became more productive and he now has discomfort in his left lower rib cage.  This feels very similar to when he previously had pneumonia a few months ago.  Denies rhinorrhea or sore throat.  Denies fevers chills or body aches.  Denies close contacts with similar symptoms.  Denies any known exposure to COVID.  He notes that his oxygen typically remains low.  He previously was on O2 but states that he was taken off of this because he did not need it anymore.  He notes that his oxygen is typically in the high 80s.  He does continue to smoke.  He does not endorse increased shortness of breath from his baseline.  HPI  Past Medical History:  Diagnosis Date  . Arthritis    "elbows, knees" (02/24/2016)  . CHF (congestive heart failure) (HCC)   . COPD (chronic obstructive pulmonary disease) (HCC)   . Coronary artery disease   . GERD (gastroesophageal reflux disease)   . High cholesterol   . Myocardial infarction [facility]) ~ 1995 X 2;1998; 2000; 2004  . On home oxygen therapy    "2L w/activity" (02/24/2016)  . Pacemaker   . Pulmonary embolism (HCC) 02/24/2016  . Sleep apnea    "have CPAP; can't tolerate it" (02/24/2016)    Patient Active Problem List   Diagnosis Date Noted  . DCM (dilated cardiomyopathy) (HCC) 05/25/2017  . Coronary artery disease involving native coronary artery  of native heart without angina pectoris 05/25/2017  . Chronic atrial fibrillation 05/25/2017  . COPD exacerbation (HCC) 05/25/2017  . CKD (chronic kidney disease) 05/25/2017  . Hypothyroidism 05/25/2017  . 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 OSA (obstructive sleep apnea) 05/25/2017  . Pulmonary HTN (HCC) 05/25/2017  . Acute on chronic respiratory failure with hypoxia (HCC)   . Mediastinal adenopathy   . Cervical adenopathy   . Acute respiratory failure with hypoxia (HCC) 02/24/2016  . Pulmonary embolus (HCC) 02/24/2016  . Pulmonary artery thrombosis (HCC)   . Pleural effusion   . Hypoxemia   . Lung mass   . Pleural plaque   . Flutter-fibrillation (HCC) 01/15/2016  . Acute systolic congestive heart failure (HCC)   . Atrial fibrillation with RVR (HCC)   . Hypercholesterolemia 03/11/2006  . TOBACCO DEPENDENCE 03/11/2006  . Acute on chronic systolic congestive heart failure (HCC) 03/11/2006  . GASTROESOPHAGEAL REFLUX, NO ESOPHAGITIS 03/11/2006  . OSTEOARTHRITIS, MULTI SITES 03/11/2006  . HIGH RISK PATIENT 03/11/2006  . Tobacco dependence 03/11/2006    Past Surgical History:  Procedure Laterality Date  . CORONARY ANGIOPLASTY  1995  . CORONARY ANGIOPLASTY WITH STENT PLACEMENT  ~ 1995 - 2004 X 5   "I've got a total of 5 stents" (02/24/2016)  . INSERT / REPLACE / REMOVE PACEMAKER  07/2003   original PPM  around 2004 for ICM with EF < 35%  . PACEMAKER GENERATOR CHANGE  03/2011    [facility] [place]  . TONSILLECTOMY         Home Medications    Prior to Admission medications   Medication Sig Start Date End Date Taking? Authorizing Provider  amiodarone (PACERONE) 200 MG tablet Take 200 mg by mouth daily.   Yes [provider]  apixaban (ELIQUIS) 5 MG TABS tablet Take 5 mg by mouth 2 (two) times daily.   Yes [provider]  Levothyroxine Sodium (LEVOTHROID PO) Take by mouth.   Yes [provider]  losartan (COZAAR) 25 MG tablet  Take 12.5 mg by mouth daily.   Yes [provider]  metoprolol succinate (TOPROL-XL) 50 MG 24 hr tablet Take 75 mg by mouth 2 (two) times daily. Take with or immediately following a meal.   Yes [provider]  mometasone (ASMANEX) 220 MCG/INH inhaler Inhale 2 puffs into the lungs 2 (two) times daily.    Yes [provider]  Multiple Vitamin (MULTIVITAMIN WITH MINERALS) TABS tablet Take 1 tablet by mouth daily.   Yes [provider]  omega-3 acid ethyl esters (LOVAZA) 1 g capsule Take 1 capsule (1 g total) by mouth 2 (two) times daily. 03/05/16  Yes Alison Murray, MD  rosuvastatin (CRESTOR) 40 MG tablet Take 40 mg by mouth daily.   Yes [provider]  spironolactone (ALDACTONE) 25 MG tablet Take 1 tablet (25 mg total) by mouth daily. 03/06/16  Yes Graciella Freer, PA-C  acetaminophen (TYLENOL) 325 MG tablet Take 2 tablets (650 mg total) by mouth every 6 (six) hours as needed for mild pain (or Fever >/= 101). 03/05/16   Alison Murray, MD  carboxymethylcellulose (REFRESH PLUS) 0.5 % SOLN Place 1 drop into both eyes at bedtime.    [provider]  nitroGLYCERIN (NITROSTAT) 0.4 MG SL tablet Place 0.4 mg under the tongue every 5 (five) minutes x 3 doses as needed for chest pain.    [provider]  Polyethyl Glycol-Propyl Glycol 0.4-0.3 % SOLN Place 1 drop into both eyes 3 (three) times daily. (SYSTANE DROPS)    [provider]  PRESCRIPTION MEDICATION Inhale 2 puffs into the lungs every 6 (six) hours as needed (wheezing/shortness of breath). (LEVALBUTEROL INHALER)    [provider]  Tiotropium Bromide-Olodaterol (STIOLTO RESPIMAT) 2.5-2.5 MCG/ACT AERS Inhale 2 puffs into the lungs 2 (two) times daily.     [provider]    Family History Family History  Problem Relation Age of Onset  . Heart attack Mother   . Heart attack Father   . Heart attack Sister 25    Social History Social History    Tobacco Use  . Smoking status: Current Some Day Smoker    Packs/day: 3.00    Years: 35.00    Pack years: 105.00    Types: Cigarettes  . Smokeless tobacco: Never Used  . Tobacco comment: 02/24/2016 "stopped ~ 8-9 months ago; have had 5 cigarettes in the last week"  Substance Use Topics  . Alcohol use: No    Comment: 02/24/2016 "none in a long time"  . Drug use: Yes    Types: Cocaine    Comment: 02/24/2016 "none since 1995"     Allergies   Crestor [rosuvastatin calcium]   Review of Systems Review of Systems  Constitutional: Positive for fatigue. Negative for activity change, appetite change, chills and fever.  HENT: Negative for congestion, ear pain,  rhinorrhea, sinus pressure, sore throat and trouble swallowing.   Eyes: Negative for discharge and redness.  Respiratory: Positive for cough and wheezing. Negative for chest tightness and shortness of breath.   Cardiovascular: Negative for chest pain.  Gastrointestinal: Negative for abdominal pain, diarrhea, nausea and vomiting.  Musculoskeletal: Negative for myalgias.  Skin: Negative for rash.  Neurological: Negative for dizziness, light-headedness and headaches.     Physical Exam Triage Vital Signs ED Triage Vitals  Enc Vitals Group     BP 06/02/18 1623 (!) 136/56     Pulse Rate 06/02/18 1623 65     Resp 06/02/18 1623 (!) 24     Temp 06/02/18 1623 98 F (36.7 C)     Temp Source 06/02/18 1623 Oral     SpO2 06/02/18 1623 (!) 89 %     Weight --      Height --      Head Circumference --      Peak Flow --      Pain Score 06/02/18 1617 0     Pain Loc --      Pain Edu? --      Excl. in GC? --    No data found.  Updated Vital Signs BP (!) 136/56 (BP Location: Left Arm)   Pulse 65   Temp 98 F (36.7 C) (Oral)   Resp (!) 24   SpO2 94%   Visual Acuity Right Eye Distance:   Left Eye Distance:   Bilateral Distance:    Right Eye Near:   Left Eye Near:    Bilateral Near:     Physical Exam Vitals signs and nursing  note reviewed.  Constitutional:      Appearance: He is well-developed.     Comments: Sitting comfortably on exam table, no acute distress  HENT:     Head: Normocephalic and atraumatic.  Eyes:     Conjunctiva/sclera: Conjunctivae normal.  Neck:     Musculoskeletal: Neck supple.  Cardiovascular:     Rate and Rhythm: Normal rate and regular rhythm.     Heart sounds: No murmur.  Pulmonary:     Effort: Pulmonary effort is normal. No respiratory distress.     Breath sounds: Wheezing present.     Comments: Wheezing auscultated throughout bilateral upper lung fields, breath sounds became more diminished throughout lower lung fields  O2 ranging from 86% to 89% room air Abdominal:     Palpations: Abdomen is soft.     Tenderness: There is no abdominal tenderness.  Skin:    General: Skin is warm and dry.  Neurological:     Mental Status: He is alert.      UC Treatments / Results  Labs (all labs ordered are listed, but only abnormal results are displayed) Labs Reviewed - No data to display  EKG None  Radiology Dg Chest 2 View  Result Date: 06/02/2018 CLINICAL DATA:  Short of breath and productive cough EXAM: CHEST - 2 VIEW COMPARISON:  02/25/2016 FINDINGS: Left subclavian AICD device is stable. Leads are intact. Normal heart size. Bibasilar heterogeneous opacities and haziness have improved indicating improved pleural effusions and airspace opacities. Pleural thickening and pleural calcifications in the right hemithorax are stable indicating some degree of chronic disease. Vascular congestion has also improved. No sign of interstitial edema. No pneumothorax. IMPRESSION: Bilateral pleural effusions and bibasilar pulmonary opacities have improved. Vascular congestion persists but is improved. Electronically Signed   By: Jolaine Click M.D.   On: 06/02/2018 17:16  Procedures Procedures (including critical care time)  Medications Ordered in UC Medications - No data to display   Initial Impression / Assessment and Plan / UC Course  I have reviewed the triage vital signs and the nursing notes.  Pertinent labs & imaging results that were available during my care of the patient were reviewed by me and considered in my medical decision making (see chart for details).     Chest x-ray not suggestive of pneumonia, EKG with junctional rhythm, symptoms most likely COPD exacerbation, cannot rule out COVID.  Patient does have chronic hypoxia, but O2 not going above 89 without O2.  He was placed on 2 L and increased to approximately 94%.  Recommending further evaluation and treatment in emergency room given his hypoxia. Transported via [employer] with nursing staff.    Final Clinical Impressions(s) / UC Diagnoses   Final diagnoses:  COPD exacerbation [facility])     Discharge Instructions     To ED with nursing    ED Prescriptions    None     Controlled Substance Prescriptions [place] Controlled Substance Registry consulted? Not Applicable   Lew Dawes, [place] 06/02/18 1737

## 2018-06-02 NOTE — ED Notes (Signed)
transported to ed by wheelchair, o2 at 2 l [place].  Transported by tina, rn

## 2018-06-02 NOTE — ED Triage Notes (Signed)
Patient left lower lung feels like when he had pneumonia in february

## 2018-06-02 NOTE — Discharge Instructions (Signed)
To ED with nursing

## 2018-06-02 NOTE — ED Notes (Signed)
Placed on 2L nasal cannula.

## 2018-06-03 MED FILL — DOXYCYCLINE HYC 100 MG CAPS: 100 | 7 days supply | Qty: 14 | Fill #0

## 2018-06-03 MED FILL — predniSONE 10 MG TABS: 10 | 10 days supply | Qty: 42 | Fill #0

## 2018-06-08 ENCOUNTER — Telehealth (HOSPITAL_COMMUNITY): Payer: Self-pay

## 2018-06-21 ENCOUNTER — Encounter (HOSPITAL_COMMUNITY): Payer: Self-pay | Admitting: Emergency Medicine

## 2018-06-21 ENCOUNTER — Emergency Department (HOSPITAL_COMMUNITY): Payer: Medicare Other

## 2018-06-21 ENCOUNTER — Inpatient Hospital Stay (HOSPITAL_COMMUNITY)
Admission: EM | Admit: 2018-06-21 | Discharge: 2018-06-23 | DRG: 292 | Disposition: A | Discharge status: Home / Self Care | Payer: Medicare Other | Attending: Internal Medicine | Admitting: Internal Medicine

## 2018-06-21 ENCOUNTER — Other Ambulatory Visit: Payer: Self-pay

## 2018-06-21 DIAGNOSIS — Z86711 Personal history of pulmonary embolism: Secondary | ICD-10-CM

## 2018-06-21 DIAGNOSIS — I509 Heart failure, unspecified: Secondary | ICD-10-CM

## 2018-06-21 DIAGNOSIS — E7800 Pure hypercholesterolemia, unspecified: Secondary | ICD-10-CM | POA: Diagnosis present

## 2018-06-21 DIAGNOSIS — Z9981 Dependence on supplemental oxygen: Secondary | ICD-10-CM

## 2018-06-21 DIAGNOSIS — I5022 Chronic systolic (congestive) heart failure: Secondary | ICD-10-CM | POA: Diagnosis present

## 2018-06-21 DIAGNOSIS — J9611 Chronic respiratory failure with hypoxia: Secondary | ICD-10-CM | POA: Diagnosis present

## 2018-06-21 DIAGNOSIS — Z955 Presence of coronary angioplasty implant and graft: Secondary | ICD-10-CM

## 2018-06-21 DIAGNOSIS — I5043 Acute on chronic combined systolic (congestive) and diastolic (congestive) heart failure: Secondary | ICD-10-CM | POA: Diagnosis not present

## 2018-06-21 DIAGNOSIS — Z79890 Hormone replacement therapy (postmenopausal): Secondary | ICD-10-CM

## 2018-06-21 DIAGNOSIS — G4730 Sleep apnea, unspecified: Secondary | ICD-10-CM | POA: Diagnosis present

## 2018-06-21 DIAGNOSIS — M1990 Unspecified osteoarthritis, unspecified site: Secondary | ICD-10-CM | POA: Diagnosis present

## 2018-06-21 DIAGNOSIS — I482 Chronic atrial fibrillation, unspecified: Secondary | ICD-10-CM | POA: Diagnosis present

## 2018-06-21 DIAGNOSIS — R079 Chest pain, unspecified: Secondary | ICD-10-CM

## 2018-06-21 DIAGNOSIS — Z79899 Other long term (current) drug therapy: Secondary | ICD-10-CM

## 2018-06-21 DIAGNOSIS — Z95810 Presence of automatic (implantable) cardiac defibrillator: Secondary | ICD-10-CM | POA: Diagnosis present

## 2018-06-21 DIAGNOSIS — N183 Chronic kidney disease, stage 3 (moderate): Secondary | ICD-10-CM | POA: Diagnosis present

## 2018-06-21 DIAGNOSIS — I5023 Acute on chronic systolic (congestive) heart failure: Secondary | ICD-10-CM | POA: Diagnosis present

## 2018-06-21 DIAGNOSIS — K219 Gastro-esophageal reflux disease without esophagitis: Secondary | ICD-10-CM | POA: Diagnosis present

## 2018-06-21 DIAGNOSIS — I5033 Acute on chronic diastolic (congestive) heart failure: Secondary | ICD-10-CM

## 2018-06-21 DIAGNOSIS — R0602 Shortness of breath: Secondary | ICD-10-CM

## 2018-06-21 DIAGNOSIS — Z7901 Long term (current) use of anticoagulants: Secondary | ICD-10-CM

## 2018-06-21 DIAGNOSIS — R918 Other nonspecific abnormal finding of lung field: Secondary | ICD-10-CM | POA: Diagnosis present

## 2018-06-21 DIAGNOSIS — Z888 Allergy status to other drugs, medicaments and biological substances status: Secondary | ICD-10-CM

## 2018-06-21 DIAGNOSIS — Z8249 Family history of ischemic heart disease and other diseases of the circulatory system: Secondary | ICD-10-CM

## 2018-06-21 DIAGNOSIS — E039 Hypothyroidism, unspecified: Secondary | ICD-10-CM | POA: Diagnosis present

## 2018-06-21 DIAGNOSIS — Z20828 Contact with and (suspected) exposure to other viral communicable diseases: Secondary | ICD-10-CM | POA: Diagnosis present

## 2018-06-21 DIAGNOSIS — Z720 Tobacco use: Secondary | ICD-10-CM | POA: Diagnosis present

## 2018-06-21 DIAGNOSIS — N189 Chronic kidney disease, unspecified: Secondary | ICD-10-CM | POA: Diagnosis present

## 2018-06-21 DIAGNOSIS — J449 Chronic obstructive pulmonary disease, unspecified: Secondary | ICD-10-CM | POA: Diagnosis present

## 2018-06-21 DIAGNOSIS — I2510 Atherosclerotic heart disease of native coronary artery without angina pectoris: Secondary | ICD-10-CM | POA: Diagnosis present

## 2018-06-21 DIAGNOSIS — I252 Old myocardial infarction: Secondary | ICD-10-CM

## 2018-06-21 DIAGNOSIS — F17200 Nicotine dependence, unspecified, uncomplicated: Secondary | ICD-10-CM | POA: Diagnosis present

## 2018-06-21 DIAGNOSIS — F17210 Nicotine dependence, cigarettes, uncomplicated: Secondary | ICD-10-CM | POA: Diagnosis present

## 2018-06-21 LAB — BASIC METABOLIC PANEL
Anion gap: 13 (ref 5–15)
BUN: 14 mg/dL (ref 8–23)
CO2: 28 mmol/L (ref 22–32)
Calcium: 9 mg/dL (ref 8.9–10.3)
Chloride: 94 mmol/L — ABNORMAL LOW (ref 98–111)
Creatinine, Ser: 1.57 mg/dL — ABNORMAL HIGH (ref 0.61–1.24)
GFR calc Af Amer: 52 mL/min — ABNORMAL LOW (ref >60)
GFR calc non Af Amer: 45 mL/min — ABNORMAL LOW (ref >60)
Glucose, Bld: 150 mg/dL — ABNORMAL HIGH (ref 70–99)
Potassium: 4.1 mmol/L (ref 3.5–5.1)
Sodium: 135 mmol/L (ref 135–145)

## 2018-06-21 LAB — CBC
HCT: 50 % (ref 39.0–52.0)
Hemoglobin: 16.2 g/dL (ref 13.0–17.0)
MCH: 30.7 pg (ref 26.0–34.0)
MCHC: 32.4 g/dL (ref 30.0–36.0)
MCV: 94.7 fL (ref 80.0–100.0)
Platelets: UNDETERMINED 10*3/uL (ref 150–400)
RBC: 5.28 MIL/uL (ref 4.22–5.81)
RDW: 14.6 % (ref 11.5–15.5)
WBC: 8.8 10*3/uL (ref 4.0–10.5)
nRBC: 0 % (ref 0.0–0.2)

## 2018-06-21 LAB — TROPONIN I: Troponin I: <0.03 ng/mL (ref <0.03)

## 2018-06-21 MED ORDER — SODIUM CHLORIDE 0.9% FLUSH: 3.0000 mL | Freq: Once | INTRAVENOUS | Status: DC

## 2018-06-21 NOTE — ED Triage Notes (Signed)
Pt c/o chest pain and bilateral feet swelling. Chest pressure started approx. 3 hours prior to arrival to ED. Denies shortness of breath. Hx MI and stents.

## 2018-06-22 ENCOUNTER — Observation Stay (HOSPITAL_COMMUNITY): Payer: Medicare Other

## 2018-06-22 ENCOUNTER — Other Ambulatory Visit: Payer: Self-pay

## 2018-06-22 DIAGNOSIS — J9611 Chronic respiratory failure with hypoxia: Secondary | ICD-10-CM | POA: Diagnosis present

## 2018-06-22 DIAGNOSIS — I252 Old myocardial infarction: Secondary | ICD-10-CM | POA: Diagnosis not present

## 2018-06-22 DIAGNOSIS — I5033 Acute on chronic diastolic (congestive) heart failure: Secondary | ICD-10-CM

## 2018-06-22 DIAGNOSIS — R079 Chest pain, unspecified: Secondary | ICD-10-CM

## 2018-06-22 DIAGNOSIS — E7800 Pure hypercholesterolemia, unspecified: Secondary | ICD-10-CM | POA: Diagnosis present

## 2018-06-22 DIAGNOSIS — I5043 Acute on chronic combined systolic (congestive) and diastolic (congestive) heart failure: Secondary | ICD-10-CM | POA: Diagnosis present

## 2018-06-22 DIAGNOSIS — J449 Chronic obstructive pulmonary disease, unspecified: Secondary | ICD-10-CM | POA: Diagnosis present

## 2018-06-22 DIAGNOSIS — F17210 Nicotine dependence, cigarettes, uncomplicated: Secondary | ICD-10-CM | POA: Diagnosis present

## 2018-06-22 DIAGNOSIS — Z79890 Hormone replacement therapy (postmenopausal): Secondary | ICD-10-CM | POA: Diagnosis not present

## 2018-06-22 DIAGNOSIS — Z20828 Contact with and (suspected) exposure to other viral communicable diseases: Secondary | ICD-10-CM | POA: Diagnosis present

## 2018-06-22 DIAGNOSIS — Z7901 Long term (current) use of anticoagulants: Secondary | ICD-10-CM | POA: Diagnosis not present

## 2018-06-22 DIAGNOSIS — G4730 Sleep apnea, unspecified: Secondary | ICD-10-CM | POA: Diagnosis present

## 2018-06-22 DIAGNOSIS — Z86711 Personal history of pulmonary embolism: Secondary | ICD-10-CM | POA: Diagnosis not present

## 2018-06-22 DIAGNOSIS — N183 Chronic kidney disease, stage 3 (moderate): Secondary | ICD-10-CM | POA: Diagnosis present

## 2018-06-22 DIAGNOSIS — Z95810 Presence of automatic (implantable) cardiac defibrillator: Secondary | ICD-10-CM | POA: Diagnosis not present

## 2018-06-22 DIAGNOSIS — I5023 Acute on chronic systolic (congestive) heart failure: Secondary | ICD-10-CM

## 2018-06-22 DIAGNOSIS — Z79899 Other long term (current) drug therapy: Secondary | ICD-10-CM | POA: Diagnosis not present

## 2018-06-22 DIAGNOSIS — R918 Other nonspecific abnormal finding of lung field: Secondary | ICD-10-CM

## 2018-06-22 DIAGNOSIS — Z955 Presence of coronary angioplasty implant and graft: Secondary | ICD-10-CM | POA: Diagnosis not present

## 2018-06-22 DIAGNOSIS — I482 Chronic atrial fibrillation, unspecified: Secondary | ICD-10-CM | POA: Diagnosis present

## 2018-06-22 DIAGNOSIS — Z9981 Dependence on supplemental oxygen: Secondary | ICD-10-CM | POA: Diagnosis not present

## 2018-06-22 DIAGNOSIS — Z888 Allergy status to other drugs, medicaments and biological substances status: Secondary | ICD-10-CM | POA: Diagnosis not present

## 2018-06-22 DIAGNOSIS — I509 Heart failure, unspecified: Secondary | ICD-10-CM

## 2018-06-22 DIAGNOSIS — M1990 Unspecified osteoarthritis, unspecified site: Secondary | ICD-10-CM | POA: Diagnosis present

## 2018-06-22 DIAGNOSIS — R0602 Shortness of breath: Secondary | ICD-10-CM

## 2018-06-22 DIAGNOSIS — I2510 Atherosclerotic heart disease of native coronary artery without angina pectoris: Secondary | ICD-10-CM | POA: Diagnosis present

## 2018-06-22 DIAGNOSIS — I5022 Chronic systolic (congestive) heart failure: Secondary | ICD-10-CM | POA: Diagnosis present

## 2018-06-22 DIAGNOSIS — Z8249 Family history of ischemic heart disease and other diseases of the circulatory system: Secondary | ICD-10-CM | POA: Diagnosis not present

## 2018-06-22 DIAGNOSIS — K219 Gastro-esophageal reflux disease without esophagitis: Secondary | ICD-10-CM | POA: Diagnosis present

## 2018-06-22 LAB — ECHOCARDIOGRAM COMPLETE
Height: 71 in
Weight: 3985.6 oz

## 2018-06-22 LAB — TROPONIN I
Troponin I: <0.03 ng/mL (ref <0.03)
Troponin I: <0.03 ng/mL (ref <0.03)
Troponin I: <0.03 ng/mL (ref <0.03)
Troponin I: <0.03 ng/mL (ref <0.03)

## 2018-06-22 LAB — BRAIN NATRIURETIC PEPTIDE: B Natriuretic Peptide: 210.5 pg/mL — ABNORMAL HIGH (ref 0.0–100.0)

## 2018-06-22 LAB — SARS CORONAVIRUS 2: SARS Coronavirus 2: NOT DETECTED

## 2018-06-22 LAB — PROCALCITONIN: Procalcitonin: <0.1 ng/mL

## 2018-06-22 LAB — HIV ANTIBODY (ROUTINE TESTING W REFLEX): HIV Screen 4th Generation wRfx: NONREACTIVE

## 2018-06-22 MED ORDER — ADULT MULTIVITAMIN W/MINERALS CH
1.0000 | ORAL_TABLET | Freq: Every day | ORAL | Status: DC
  Administered 2018-06-22 – 2018-06-23 (×2): 1 via ORAL
  Filled 2018-06-22 (×2): qty 1

## 2018-06-22 MED ORDER — POLYVINYL ALCOHOL 1.4 % OP SOLN
1.0000 [drp] | Freq: Three times a day (TID) | OPHTHALMIC | Status: DC
  Administered 2018-06-22 – 2018-06-23 (×4): 1 [drp] via OPHTHALMIC
  Filled 2018-06-22: qty 15

## 2018-06-22 MED ORDER — ARFORMOTEROL TARTRATE 15 MCG/2ML IN NEBU
15.0000 ug | INHALATION_SOLUTION | Freq: Two times a day (BID) | RESPIRATORY_TRACT | Status: DC
  Administered 2018-06-22 – 2018-06-23 (×3): 15 ug via RESPIRATORY_TRACT
  Filled 2018-06-22 (×3): qty 2

## 2018-06-22 MED ORDER — SPIRONOLACTONE 25 MG PO TABS
25.0000 mg | ORAL_TABLET | Freq: Every day | ORAL | Status: DC
  Administered 2018-06-22 – 2018-06-23 (×2): 25 mg via ORAL
  Filled 2018-06-22 (×2): qty 1

## 2018-06-22 MED ORDER — ACETAMINOPHEN 325 MG PO TABS: 650.0000 mg | ORAL_TABLET | Freq: Four times a day (QID) | ORAL | Status: DC | PRN

## 2018-06-22 MED ORDER — OMEGA-3-ACID ETHYL ESTERS 1 G PO CAPS
1.0000 g | ORAL_CAPSULE | Freq: Two times a day (BID) | ORAL | Status: DC
  Administered 2018-06-22 – 2018-06-23 (×3): 1 g via ORAL
  Filled 2018-06-22 (×3): qty 1

## 2018-06-22 MED ORDER — LEVALBUTEROL HCL 0.63 MG/3ML IN NEBU: 0.6300 mg | INHALATION_SOLUTION | Freq: Four times a day (QID) | RESPIRATORY_TRACT | Status: DC | PRN

## 2018-06-22 MED ORDER — SODIUM CHLORIDE 0.9% FLUSH: 3.0000 mL | INTRAVENOUS | Status: DC | PRN

## 2018-06-22 MED ORDER — FUROSEMIDE 10 MG/ML IJ SOLN
40.0000 mg | Freq: Every day | INTRAMUSCULAR | Status: DC
  Administered 2018-06-22: 40 mg via INTRAVENOUS
  Filled 2018-06-22 (×2): qty 4

## 2018-06-22 MED ORDER — CARBOXYMETHYLCELLULOSE SODIUM 0.5 % OP SOLN: 1.0000 [drp] | Freq: Every day | OPHTHALMIC | Status: DC

## 2018-06-22 MED ORDER — GUAIFENESIN ER 600 MG PO TB12
600.0000 mg | ORAL_TABLET | Freq: Two times a day (BID) | ORAL | Status: DC
  Administered 2018-06-22 – 2018-06-23 (×3): 600 mg via ORAL
  Filled 2018-06-22 (×3): qty 1

## 2018-06-22 MED ORDER — METOPROLOL SUCCINATE ER 25 MG PO TB24
75.0000 mg | ORAL_TABLET | Freq: Two times a day (BID) | ORAL | Status: DC
  Administered 2018-06-22 – 2018-06-23 (×3): 75 mg via ORAL
  Filled 2018-06-22 (×3): qty 3

## 2018-06-22 MED ORDER — ONDANSETRON HCL 4 MG/2ML IJ SOLN: 4.0000 mg | Freq: Four times a day (QID) | INTRAMUSCULAR | Status: DC | PRN

## 2018-06-22 MED ORDER — ROSUVASTATIN CALCIUM 20 MG PO TABS
40.0000 mg | ORAL_TABLET | Freq: Every day | ORAL | Status: DC
  Administered 2018-06-22 – 2018-06-23 (×2): 40 mg via ORAL
  Filled 2018-06-22 (×2): qty 2

## 2018-06-22 MED ORDER — PERFLUTREN LIPID MICROSPHERE
1.0000 mL | INTRAVENOUS | Status: AC | PRN
  Administered 2018-06-22: 3 mL via INTRAVENOUS
  Filled 2018-06-22: qty 10

## 2018-06-22 MED ORDER — FUROSEMIDE 10 MG/ML IJ SOLN
40.0000 mg | Freq: Two times a day (BID) | INTRAMUSCULAR | Status: DC
  Administered 2018-06-22 – 2018-06-23 (×2): 40 mg via INTRAVENOUS
  Filled 2018-06-22: qty 4

## 2018-06-22 MED ORDER — NITROGLYCERIN 0.4 MG SL SUBL: 0.4000 mg | SUBLINGUAL_TABLET | SUBLINGUAL | Status: DC | PRN

## 2018-06-22 MED ORDER — AMIODARONE HCL 200 MG PO TABS
200.0000 mg | ORAL_TABLET | Freq: Every day | ORAL | Status: DC
  Administered 2018-06-22 – 2018-06-23 (×2): 200 mg via ORAL
  Filled 2018-06-22 (×2): qty 1

## 2018-06-22 MED ORDER — APIXABAN 5 MG PO TABS
5.0000 mg | ORAL_TABLET | Freq: Two times a day (BID) | ORAL | Status: DC
  Administered 2018-06-22 – 2018-06-23 (×3): 5 mg via ORAL
  Filled 2018-06-22 (×3): qty 1

## 2018-06-22 MED ORDER — LOSARTAN POTASSIUM 25 MG PO TABS
12.5000 mg | ORAL_TABLET | Freq: Every day | ORAL | Status: DC
  Administered 2018-06-22 – 2018-06-23 (×2): 12.5 mg via ORAL
  Filled 2018-06-22 (×2): qty 1

## 2018-06-22 MED ORDER — SODIUM CHLORIDE 0.9 % IV SOLN: 250.0000 mL | INTRAVENOUS | Status: DC | PRN

## 2018-06-22 MED ORDER — SODIUM CHLORIDE 0.9% FLUSH
3.0000 mL | Freq: Two times a day (BID) | INTRAVENOUS | Status: DC
  Administered 2018-06-22 – 2018-06-23 (×3): 3 mL via INTRAVENOUS

## 2018-06-22 MED ORDER — BUDESONIDE 0.5 MG/2ML IN SUSP
2.0000 mL | Freq: Two times a day (BID) | RESPIRATORY_TRACT | Status: DC
  Administered 2018-06-22 – 2018-06-23 (×3): 0.5 mg via RESPIRATORY_TRACT
  Filled 2018-06-22 (×3): qty 2

## 2018-06-22 MED ORDER — UMECLIDINIUM BROMIDE 62.5 MCG/INH IN AEPB
1.0000 | INHALATION_SPRAY | Freq: Every day | RESPIRATORY_TRACT | Status: DC
  Administered 2018-06-22 – 2018-06-23 (×2): 1 via RESPIRATORY_TRACT
  Filled 2018-06-22: qty 7

## 2018-06-22 NOTE — ED Provider Notes (Signed)
[name] Provider Note   CSN: [id number] Arrival date & time: 06/21/18  2101    History   Chief Complaint Chief Complaint  Patient presents with  . Chest Pain    HPI Max Rivas is a 68 y.o. male with a PMH of CHF, MI (1995, 1998, 2000, 2004), PE on Eliquis, Atrial fibrillaiton, COPD, and CKD presenting with constant non radiating chest pain described as heaviness throughout the day yesterday. Patient states pain was similar to previous MI. Patient states pain resolved prior to coming to the ER. Patient reports associated shortness of breath with exertion. Patient states nothing made symptoms better or worse. Patient states he was evaluated at an urgent care today and advised to come to the ER due to chest pain and oxygen saturation dropped to the 80s. Patient also reports bilateral leg edema for 4 days onset after eating fried food. Patient denies pain in legs. Patient reports he has had chronic erythema on his feet for several months. Patient reports tobacco use. Patient denies alcohol or drug use. Patient reports an intermittent cough for a few weeks, but states it has improved. Patient denies fever, congestion, rhinorrhea, nausea, vomiting, or abdominal pain. Patient denies sick contacts or recent travel. Patient denies pain currently. Patient reports he has been compliant with his medications and states he has not missed any doses of Eliquis. Per chart review, echo in 2018 reveals LVEF 25-30%. Patient states he had another echo performed 6 months ago, but is unsure of results. Patient states echo was [facility] at the [facility].      HPI  Past Medical History:  Diagnosis Date  . Arthritis    "elbows, knees" (02/24/2016)  . CHF (congestive heart failure) (HCC)   . COPD (chronic obstructive pulmonary disease) (HCC)   . Coronary artery disease   . GERD (gastroesophageal reflux disease)   . High cholesterol   . Myocardial infarction [facility](HCC) ~ 1995 X  2;1998; 2000; 2004  . On home oxygen therapy    "2L w/activity" (02/24/2016)  . Pacemaker   . Pulmonary embolism (HCC) 02/24/2016  . Sleep apnea    "have CPAP; can't tolerate it" (02/24/2016)    Patient Active Problem List   Diagnosis Date Noted  . DCM (dilated cardiomyopathy) (HCC) 05/25/2017  . Coronary artery disease involving native coronary artery of native heart without angina pectoris 05/25/2017  . Chronic atrial fibrillation 05/25/2017  . COPD exacerbation (HCC) 05/25/2017  . CKD (chronic kidney disease) 05/25/2017  . Hypothyroidism 05/25/2017  . 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 OSA (obstructive sleep apnea) 05/25/2017  . Pulmonary HTN (HCC) 05/25/2017  . Acute on chronic respiratory failure with hypoxia (HCC)   . Mediastinal adenopathy   . Cervical adenopathy   . Acute respiratory failure with hypoxia (HCC) 02/24/2016  . Pulmonary embolus (HCC) 02/24/2016  . Pulmonary artery thrombosis (HCC)   . Pleural effusion   . Hypoxemia   . Lung mass   . Pleural plaque   . Flutter-fibrillation (HCC) 01/15/2016  . Acute systolic congestive heart failure (HCC)   . Atrial fibrillation with RVR (HCC)   . Hypercholesterolemia 03/11/2006  . TOBACCO DEPENDENCE 03/11/2006  . Acute on chronic systolic congestive heart failure (HCC) 03/11/2006  . GASTROESOPHAGEAL REFLUX, NO ESOPHAGITIS 03/11/2006  . OSTEOARTHRITIS, MULTI SITES 03/11/2006  . HIGH RISK PATIENT 03/11/2006  . Tobacco dependence 03/11/2006    Past Surgical History:  Procedure Laterality Date  . CORONARY ANGIOPLASTY  1995  .  CORONARY ANGIOPLASTY WITH STENT PLACEMENT  ~ 1995 - 2004 X 5   "I've got a total of 5 stents" (02/24/2016)  . 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 [facility]   . TONSILLECTOMY          Home Medications    Prior to Admission medications   Medication Sig Start  Date End Date Taking? Authorizing Provider  acetaminophen (TYLENOL) 325 MG tablet Take 2 tablets (650 mg total) by mouth every 6 (six) hours as needed for mild pain (or Fever >/= 101). 03/05/16   Alison Murrayevine, Alma M, MD  amiodarone (PACERONE) 200 MG tablet Take 200 mg by mouth daily.    [provider]  apixaban (ELIQUIS) 5 MG TABS tablet Take 5 mg by mouth 2 (two) times daily.    [provider]  carboxymethylcellulose (REFRESH PLUS) 0.5 % SOLN Place 1 drop into both eyes at bedtime.    [provider]  Levothyroxine Sodium (LEVOTHROID PO) Take by mouth.    [provider]  losartan (COZAAR) 25 MG tablet Take 12.5 mg by mouth daily.    [provider]  metoprolol succinate (TOPROL-XL) 50 MG 24 hr tablet Take 75 mg by mouth 2 (two) times daily. Take with or immediately following a meal.    [provider]  mometasone (ASMANEX) 220 MCG/INH inhaler Inhale 2 puffs into the lungs 2 (two) times daily.     [provider]  Multiple Vitamin (MULTIVITAMIN WITH MINERALS) TABS tablet Take 1 tablet by mouth daily.    [provider]  nitroGLYCERIN (NITROSTAT) 0.4 MG SL tablet Place 0.4 mg under the tongue every 5 (five) minutes x 3 doses as needed for chest pain.    [provider]  omega-3 acid ethyl esters (LOVAZA) 1 g capsule Take 1 capsule (1 g total) by mouth 2 (two) times daily. 03/05/16   Alison Murrayevine, Alma M, MD  Polyethyl Glycol-Propyl Glycol 0.4-0.3 % SOLN Place 1 drop into both eyes 3 (three) times daily. (SYSTANE DROPS)    [provider]  PRESCRIPTION MEDICATION Inhale 2 puffs into the lungs every 6 (six) hours as needed (wheezing/shortness of breath). (LEVALBUTEROL INHALER)    [provider]  rosuvastatin (CRESTOR) 40 MG tablet Take 40 mg by mouth daily.    [provider]  spironolactone (ALDACTONE) 25 MG tablet Take 1 tablet (25 mg total) by mouth daily. 03/06/16   Graciella Freerillery, Michael Andrew, PA-C   Tiotropium Bromide-Olodaterol (STIOLTO RESPIMAT) 2.5-2.5 MCG/ACT AERS Inhale 2 puffs into the lungs 2 (two) times daily.     [provider]    Family History Family History  Problem Relation Age of Onset  . Heart attack Mother   . Heart attack Father   . Heart attack Sister [id number]    Social History Social History   Tobacco Use  . Smoking status: Current Some Day Smoker    Packs/day: 3.00    Years: 35.00    Pack years: 105.00    Types: Cigarettes  . Smokeless tobacco: Never Used  . Tobacco comment: 02/24/2016 "stopped ~ 8-9 months ago; have had 5 cigarettes in the last week"  Substance Use Topics  . Alcohol use: No    Comment: 02/24/2016 "none in a long time"  . Drug use: Yes    Types: Cocaine    Comment: 02/24/2016 "none since 1995"  Allergies   Crestor [rosuvastatin calcium]   Review of Systems Review of Systems  Constitutional: Negative for activity change, appetite change, chills, diaphoresis, fatigue, fever and unexpected weight change.  HENT: Negative for congestion and rhinorrhea.   Respiratory: Positive for cough and shortness of breath. Negative for chest tightness and wheezing.   Cardiovascular: Positive for chest pain and leg swelling. Negative for palpitations.  Gastrointestinal: Negative for abdominal pain, nausea and vomiting.  Endocrine: Negative for cold intolerance and heat intolerance.  Musculoskeletal: Negative for back pain.  Skin: Negative for rash.  Neurological: Negative for dizziness, syncope, weakness and light-headedness.  Psychiatric/Behavioral: Negative for agitation and behavioral problems. The patient is not nervous/anxious.      Physical Exam Updated Vital Signs BP 121/61   Pulse 71   Temp 98.4 F (36.9 C) (Oral)   Resp 19   SpO2 95%   Physical Exam Vitals signs and nursing note reviewed.  Constitutional:      General: He is not in acute distress.    Appearance: He is well-developed. He is not diaphoretic.  HENT:      Head: Normocephalic and atraumatic.  Neck:     Musculoskeletal: Normal range of motion and neck supple.     Vascular: No JVD.  Cardiovascular:     Rate and Rhythm: Normal rate and regular rhythm.     Pulses: Normal pulses.          Radial pulses are 2+ on the right side and 2+ on the left side.       Dorsalis pedis pulses are 2+ on the right side and 2+ on the left side.     Heart sounds: Normal heart sounds. No murmur. No friction rub. No gallop.   Pulmonary:     Effort: Pulmonary effort is normal. No respiratory distress.     Breath sounds: Normal breath sounds. No wheezing, rhonchi or rales.     Comments: Patient is speaking in full sentences without difficulty.  Chest:     Chest wall: No tenderness.  Abdominal:     Palpations: Abdomen is soft.     Tenderness: There is no abdominal tenderness.  Musculoskeletal: Normal range of motion.     Right lower leg: He exhibits no tenderness. 2+ Pitting Edema present.     Left lower leg: He exhibits no tenderness. 2+ Pitting Edema present.  Skin:    Capillary Refill: Capillary refill takes less than 2 seconds.     Coloration: Skin is not pale.     Findings: Erythema (Erythema noted over feet bilaterally. Patient states this is chronic.) present. No rash.  Neurological:     Mental Status: He is alert.     ED Treatments / Results  Labs (all labs ordered are listed, but only abnormal results are displayed) Labs Reviewed  BASIC METABOLIC PANEL - Abnormal; Notable for the following components:      Result Value   Chloride 94 (*)    Glucose, Bld 150 (*)    Creatinine, Ser 1.57 (*)    GFR calc non Af Amer 45 (*)    GFR calc Af Amer 52 (*)    All other components within normal limits  BRAIN NATRIURETIC PEPTIDE - Abnormal; Notable for the following components:   B Natriuretic Peptide 210.5 (*)    All other components within normal limits  SARS CORONAVIRUS 2 (HOSPITAL ORDER, PERFORMED IN [facility])  CBC  TROPONIN I   TROPONIN I  PROCALCITONIN  TROPONIN I  TROPONIN I  TROPONIN I    EKG None ECG interpretation: Normal sinus rhythm 68 bpm.  Normal axis.  Normal intervals.  ST and T waves normal.  Low voltage QRS.  When compared with ECG of Jun 02, 2018, no significant changes are seen.  Radiology Dg Chest 2 View  Result Date: 06/21/2018 CLINICAL DATA:  68 year old male with left side chest pain, productive cough. Smoker. Negative for COVID-19 at the end of May. EXAM: CHEST - 2 VIEW COMPARISON:  06/02/2018 and earlier. FINDINGS: Stable lung volumes. Stable cardiac size and mediastinal contours. Stable left chest AICD. Chronic lung disease with hazy mid right lung and bibasilar pulmonary opacity which is stable from 06/02/2018. Basilar ventilation is improved compared to 2018 radiographs. Chronic blunting of the costophrenic angles. No pneumothorax or pulmonary edema. No new pulmonary opacity. No acute osseous abnormality identified. Negative visible bowel gas pattern. IMPRESSION: Chronic lung disease with unchanged appearance of the lungs since 06/02/2018, no definite acute cardiopulmonary abnormality. Electronically Signed   By: Genevie Ann M.D.   On: 06/21/2018 21:40    Procedures Procedures (including critical care time)  Medications Ordered in ED Medications  sodium chloride flush (NS) 0.9 % injection 3 mL (has no administration in time range)  apixaban (ELIQUIS) tablet 5 mg (has no administration in time range)  amiodarone (PACERONE) tablet 200 mg (has no administration in time range)  acetaminophen (TYLENOL) tablet 650 mg (has no administration in time range)  carboxymethylcellulose (REFRESH PLUS) 0.5 % ophthalmic solution 1 drop (has no administration in time range)  rosuvastatin (CRESTOR) tablet 40 mg (has no administration in time range)  spironolactone (ALDACTONE) tablet 25 mg (has no administration in time range)  metoprolol succinate (TOPROL-XL) 24 hr tablet 75 mg (has no administration in time  range)  losartan (COZAAR) tablet 12.5 mg (has no administration in time range)  mometasone (ASMANEX) inhaler 2 puff (has no administration in time range)  nitroGLYCERIN (NITROSTAT) SL tablet 0.4 mg (has no administration in time range)  multivitamin with minerals tablet 1 tablet (has no administration in time range)  Polyethyl Glycol-Propyl Glycol 0.4-0.3 % SOLN 1 drop (has no administration in time range)  omega-3 acid ethyl esters (LOVAZA) capsule 1 g (has no administration in time range)  arformoterol (BROVANA) nebulizer solution 15 mcg (has no administration in time range)  umeclidinium bromide (INCRUSE ELLIPTA) 62.5 MCG/INH 1 puff (has no administration in time range)  levalbuterol (XOPENEX HFA) inhaler 1-2 puff (has no administration in time range)  furosemide (LASIX) injection 40 mg (has no administration in time range)     Initial Impression / Assessment and Plan / ED Course  I have reviewed the triage vital signs and the nursing notes.  Pertinent labs & imaging results that were available during my care of the patient were reviewed by me and considered in my medical decision making (see chart for details).  Clinical Course as of Jun 22 407  Wed Jun 22, 2018  0146 Chronic lung disease with unchanged appearance of the lungs since 06/02/2018, no definite acute cardiopulmonary abnormality.    DG Chest 2 View [AH]  [id number] Creatinine elevated at 1.57. This appears slightly higher than previous values.  Creatinine(!): 1.57 [AH]  0340 BNP elevated at 210.5.  Brain natriuretic peptide(!) [AH]  0340 WBCs are within normal limits. Hemoglobin is stable.  CBC [AH]  0341 Troponin negative x 2.  Troponin I - ONCE - STAT [AH]  0350 Patient is now requiring 2L of oxygen.    [  AH]    Clinical Course User Index [AH] Leretha Dykes, PA-C      Concern for cardiac etiology of Chest Pain due to multiple risk factors. Patient has not had additional chest pain while in the ER. Pt has been  re-evaluated prior to consult and VSS, NAD, heart RRR, pain 0/10, lungs CTAB. No acute abnormalities found on EKG and first round of cardiac enzymes negative. BNP elevated at 210, but this appears to have improved when compared to previous values. Patient will need admission for serial troponin and EKGs. CXR reveals chronic lung disease with hazy mid right lung and bibasilar pulmonary opacity tha tis stable. Patient is also requiring 2L of oxygen at this time. Oxygen saturations dropped in the 80s. Patient does not wear oxygen at home. Consulted hospitalist for admission. Hospitalist has agreed to admit patient.   This case was discussed with Dr. Preston Fleeting who has seen the patient and agrees with plan to admit.   Final Clinical Impressions(s) / ED Diagnoses   Final diagnoses:  Chest pain in adult  Shortness of breath  Acute on chronic diastolic congestive heart failure [facility])    ED Discharge Orders    None       Leretha Dykes, [place] 06/22/18 0409    Dione Booze, MD 06/22/18 Milus Mallick    Dione Booze, MD 06/22/18 2255

## 2018-06-22 NOTE — Plan of Care (Signed)
?  Problem: Activity: ?Goal: Capacity to carry out activities will improve ?Outcome: Progressing ?  ?

## 2018-06-22 NOTE — Progress Notes (Addendum)
Care began before midnight. See H & P. Hx of CHF with ef 25% s/p AICD, cad, PE, afib admitted with acute on chronic heart failure. IV lasix started. trops 0.03 x3. ekg without changes. Chest xray with Chronic lung disease with unchanged appearance of the lungs since 06/02/2018, no definite acute cardiopulmonary abnormality.  PE Gen: lying in bed awake alert no acute distress. Poor dentitin CV: rrr no mgr 2+LE pitting edema bilaterally. Bilateral feet with mild erythema, dry skin likely chronic venous stasis changes PPP Resp: no increased work of breathing. BS quite distant particularly bases. No crackles. Now wheezes but tight sounding Abd: soft +BS no guarding  A/p  1. Acute on chronic systolic heart failure. Patient with severe known systolic heart failure with EF 25% s/p AICD, in concert with chronic afib. Concerned about ability to manage volume as out patient. Patient needs inpatient monitoring with ongoing IV lasix given volume overload as evidenced by 2+ bilateral pitting edema, jvd, elevated bnp.  Reports compliance with med. Some dietary indiscretions noted. Volume status -600 so far. -will increase lasix to BID (home dose 40mg  po daily) -monitor intake and output -daily weight -continue home BB -continue home spironolactone -follow echo  2. Chronic afib. Chadvasc score 4. Currently SR -continue amiodorone -continue BB -continue eliquis  3. Chronic respiratory failure with hypoxia in setting of current smoker. Appears stable at baseline. Not currently on home oxygen but has been in past. Oxygen saturation level greater 90% on room air -continue home inhalers -monitor  4. CKD stage III. Creatinine 1.57 on admission -awaiting bmet -monitor urine output  5. Hx lung mass. May need CT if no improvement.   Dyanne Carrel, NP

## 2018-06-22 NOTE — ED Notes (Signed)
Metronic report. No arrhthymias, Device is functioning properly. Last checked 03/28/2018

## 2018-06-22 NOTE — ED Notes (Signed)
ED TO INPATIENT HANDOFF REPORT  ED Nurse Name and Phone #: Houston SirenBrie,RN [phone]  S Name/Age/Gender Max Rivas 68 y.o. male Room/Bed: 018C/018C  Code Status   Code Status: DNR  Home/SNF/Other Home Patient oriented to: self, place, time and situation Is this baseline? Yes   Triage Complete: Triage complete  Chief Complaint CHEST PAIN,LOW OXYGEN LEVELS  Triage Note Pt c/o chest pain and bilateral feet swelling. Chest pressure started approx. 3 hours prior to arrival to ED. Denies shortness of breath. Hx MI and stents.   Allergies Allergies  Allergen Reactions  . Crestor [Rosuvastatin Calcium] Other (See Comments)    Back spasms    Level of Care/Admitting Diagnosis ED Disposition    ED Disposition Condition Comment   Admit  [name] [[id number][facility]  Level of Care: Telemetry Cardiac [103]  I expect the patient will be discharged within 24 hours: No (not a candidate for 5C-Observation unit)  Covid Evaluation: Screening Protocol (No Symptoms)  Diagnosis: Acute on chronic systolic CHF (congestive heart failure) [facility](HCC) [[id number]]) [[id number]]  Admitting Physician: Hillary BowGARDNER, JARED M [phone]]  Attending Physician: Hillary BowGARDNER, JARED M [[id number]]  PT Class (Do Not Modify): Observation [104]  PT Acc Code (Do Not Modify): Observation [10022]       B Medical/Surgery History Past Medical History:  Diagnosis Date  . Arthritis    "elbows, knees" (02/24/2016)  . CHF (congestive heart failure) (HCC)   . COPD (chronic obstructive pulmonary disease) (HCC)   . Coronary artery disease   . GERD (gastroesophageal reflux disease)   . High cholesterol   . Myocardial infarction [facility](HCC) ~ 1995 X 2;1998; 2000; 2004  . On home oxygen therapy    "2L w/activity" (02/24/2016)  . Pacemaker   . Pulmonary embolism (HCC) 02/24/2016  . Sleep apnea    "have CPAP; can't tolerate it" (02/24/2016)   Past Surgical History:  Procedure Laterality Date  . CORONARY ANGIOPLASTY  1995  . CORONARY  ANGIOPLASTY WITH STENT PLACEMENT  ~ 1995 - 2004 X 5   "I've got a total of 5 stents" (02/24/2016)  . 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 [facility]   [facility]IL[facility]Y       A IV Location/Drains/Wounds Patient Lines/Drains/Airways Status   Active Line/Drains/Airways    Name:   Placement date:   Placement time:   Site:   Days:   Peripheral IV 06/22/18 Left Antecubital   06/22/18    0324    Antecubital   less than 1          Intake/Output Last 24 hours No intake or output data in the 24 hours ending 06/22/18 0436  Labs/Imaging Results for orders placed or performed during the hospital encounter of 06/21/18 (from the past 48 hour(s))  Basic metabolic panel     Status: Abnormal   Collection Time: 06/21/18  9:20 PM  Result Value Ref Range   Sodium 135 135 - 145 mmol/L   Potassium 4.1 3.5 - 5.1 mmol/L   Chloride 94 (L) 98 - 111 mmol/L   CO2 28 22 - 32 mmol/L   Glucose, Bld 150 (H) 70 - 99 mg/dL   BUN 14 8 - 23 mg/dL   Creatinine, Ser 1.911.57 (H) 0.61 - 1.24 mg/dL   Calcium 9.0 8.9 - 47.810.3 mg/dL   GFR calc non Af Amer 45 (L) >60 mL/min  GFR calc Af Amer 52 (L) >60 mL/min   Anion gap 13 5 - 15    Comment: Performed at [facility], [address], [place], [place] [address]  CBC     Status: None   Collection Time: 06/21/18  9:20 PM  Result Value Ref Range   WBC 8.8 4.0 - 10.5 K/uL   RBC 5.28 4.22 - 5.81 MIL/uL   Hemoglobin 16.2 13.0 - 17.0 g/dL   HCT 87.5 64.3 - 32.9 %   MCV 94.7 80.0 - 100.0 fL   MCH 30.7 26.0 - 34.0 pg   MCHC 32.4 30.0 - 36.0 g/dL   RDW 51.8 84.1 - 66.0 %   Platelets PLATELET CLUMPS NOTED ON SMEAR, UNABLE TO ESTIMATE 150 - 400 K/uL    Comment: PLATELET CLUMPING, SUGGEST RECOLLECTION OF SAMPLE IN CITRATE TUBE. Immature Platelet Fraction may be clinically indicated, consider ordering this additional test [id number]    nRBC 0.0 0.0 - 0.2 %    Comment:  Performed at [facility], [address] [place], [place] [address]  Troponin I - ONCE - STAT     Status: None   Collection Time: 06/21/18  9:20 PM  Result Value Ref Range   Troponin I <0.03 <0.03 ng/mL    Comment: Performed at [facility], [address], [place], [place] [address]  Brain natriuretic peptide     Status: Abnormal   Collection Time: 06/21/18  9:20 PM  Result Value Ref Range   B Natriuretic Peptide 210.5 (H) 0.0 - 100.0 pg/mL    Comment: Performed at [facility], [address], [place], [place] [address]  Troponin I - ONCE - STAT     Status: None   Collection Time: 06/22/18  2:21 AM  Result Value Ref Range   Troponin I <0.03 <0.03 ng/mL    Comment: Performed at [facility], [address] [place], [place] [address]   Dg Chest 2 View  Result Date: 06/21/2018 CLINICAL DATA:  68 year old male with left side chest pain, productive cough. Smoker. Negative for COVID-19 at the end of May. EXAM: CHEST - 2 VIEW COMPARISON:  06/02/2018 and earlier. FINDINGS: Stable lung volumes. Stable cardiac size and mediastinal contours. Stable left chest AICD. Chronic lung disease with hazy mid right lung and bibasilar pulmonary opacity which is stable from 06/02/2018. Basilar ventilation is improved compared to 2018 radiographs. Chronic blunting of the costophrenic angles. No pneumothorax or pulmonary edema. No new pulmonary opacity. No acute osseous abnormality identified. Negative visible bowel gas pattern. IMPRESSION: Chronic lung disease with unchanged appearance of the lungs since 06/02/2018, no definite acute cardiopulmonary abnormality. Electronically Signed   By: Odessa Fleming M.D.   On: 06/21/2018 21:40    Pending Labs Unresulted Labs (From admission, onward)    Start     Ordered   06/23/18 0500  Basic metabolic panel  Daily,   R     06/22/18 0410   06/22/18 0600  Troponin I - Now Then Q6H  Now then every 6 hours,   R     06/22/18 0407   06/22/18 0408  HIV  antibody (Routine Testing)  Once,   R     06/22/18 0410   06/22/18 0358  Procalcitonin - Baseline  ONCE - STAT,   STAT     06/22/18 0357   06/22/18 0350  SARS Coronavirus 2 (CEPHEID- Performed in [facility] hospital lab), Hosp Order  (Symptomatic Patients Labs  with Precautions )  Once,   R     06/22/18 0350          Vitals/Pain Today's Vitals   06/22/18 0230 06/22/18 0316 06/22/18 0320 06/22/18 0324  BP: (!) 146/81 121/61    Pulse: 66 71    Resp: 19 19    Temp:      TempSrc:      SpO2: 92% (!) 88% 95%   PainSc:    0-No pain    Isolation Precautions No active isolations  Medications Medications  sodium chloride flush (NS) 0.9 % injection 3 mL (has no administration in time range)  apixaban (ELIQUIS) tablet 5 mg (has no administration in time range)  amiodarone (PACERONE) tablet 200 mg (has no administration in time range)  acetaminophen (TYLENOL) tablet 650 mg (has no administration in time range)  carboxymethylcellulose (REFRESH PLUS) 0.5 % ophthalmic solution 1 drop (has no administration in time range)  rosuvastatin (CRESTOR) tablet 40 mg (has no administration in time range)  spironolactone (ALDACTONE) tablet 25 mg (has no administration in time range)  metoprolol succinate (TOPROL-XL) 24 hr tablet 75 mg (has no administration in time range)  losartan (COZAAR) tablet 12.5 mg (has no administration in time range)  mometasone (ASMANEX) inhaler 2 puff (has no administration in time range)  nitroGLYCERIN (NITROSTAT) SL tablet 0.4 mg (has no administration in time range)  multivitamin with minerals tablet 1 tablet (has no administration in time range)  Polyethyl Glycol-Propyl Glycol 0.4-0.3 % SOLN 1 drop (has no administration in time range)  omega-3 acid ethyl esters (LOVAZA) capsule 1 g (has no administration in time range)  arformoterol (BROVANA) nebulizer solution 15 mcg (has no administration in time range)  umeclidinium bromide (INCRUSE ELLIPTA) 62.5 MCG/INH 1 puff  (has no administration in time range)  levalbuterol (XOPENEX HFA) inhaler 1-2 puff (has no administration in time range)  furosemide (LASIX) injection 40 mg (has no administration in time range)  sodium chloride flush (NS) 0.9 % injection 3 mL (has no administration in time range)  sodium chloride flush (NS) 0.9 % injection 3 mL (has no administration in time range)  0.9 %  sodium chloride infusion (has no administration in time range)  ondansetron (ZOFRAN) injection 4 mg (has no administration in time range)    Mobility walks with device Low fall risk   Focused Assessments Cardiac Assessment Handoff:  Cardiac Rhythm: Normal sinus rhythm Lab Results  Component Value Date   TROPONINI <0.03 06/22/2018   Lab Results  Component Value Date   DDIMER 3.99 (H) 02/24/2016   Does the Patient currently have chest pain? No     R Recommendations: See Admitting Provider Note  Report given to:   Additional Notes:

## 2018-06-22 NOTE — H&P (Signed)
History and Physical    Max Rivas GGE:[id number] DOB: 11/27/1950 DOA: 06/21/2018  PCP: Patient, No Pcp Per  Patient coming from: Home  I have personally briefly reviewed patient's old medical records in [facility]  Chief Complaint: Chest pain  HPI: Max Rivas is a 68 y.o. male with medical history significant of CHF with EF 25-30% s/p AICD placement, CAD, PE, A.Fib, on eliquis, CKD.  Patient presents to the ED with CP described as heaviness.  Symptoms ongoing throughout the day yesterday, similar to prior MIs.  Pain resolved prior to coming to ED.  Associated SOB with exertion and 4 day h/o BLE edema.  Intermittent cough for past couple of weeks though COVID testing was negative when he was last seen in the ED back in May.  Work up that time was noteable for bibasilar opacities, EDP wanted to admit patient due to hypoxia but patient refused.   ED Course: CXR again shows bibasilar opacities, BNP 210, trop neg x2.  WBC nl, no SIRS.  COVID pending   Review of Systems: As per HPI otherwise 10 point review of systems negative.   Past Medical History:  Diagnosis Date  . Arthritis    "elbows, knees" (02/24/2016)  . CHF (congestive heart failure) (HCC)   . COPD (chronic obstructive pulmonary disease) (HCC)   . Coronary artery disease   . GERD (gastroesophageal reflux disease)   . High cholesterol   . Myocardial infarction [facility]) ~ 1995 X 2;1998; 2000; 2004  . On home oxygen therapy    "2L w/activity" (02/24/2016)  . Pacemaker   . Pulmonary embolism (HCC) 02/24/2016  . Sleep apnea    "have CPAP; can't tolerate it" (02/24/2016)    Past Surgical History:  Procedure Laterality Date  . CORONARY ANGIOPLASTY  1995  . CORONARY ANGIOPLASTY WITH STENT PLACEMENT  ~ 1995 - 2004 X 5   "I've got a total of 5 stents" (02/24/2016)  . 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 [facility] [place]  .  TONSILLECTOMY       reports that he has been smoking cigarettes. He has a 105.00 pack-year smoking history. He has never used smokeless tobacco. He reports current drug use. Drug: Cocaine. He reports that he does not drink alcohol.  Allergies  Allergen Reactions  . Crestor [Rosuvastatin Calcium] Other (See Comments)    Back spasms    Family History  Problem Relation Age of Onset  . Heart attack Mother   . Heart attack Father   . Heart attack Sister 88     Prior to Admission medications   Medication Sig Start Date End Date Taking? Authorizing Provider  acetaminophen (TYLENOL) 325 MG tablet Take 2 tablets (650 mg total) by mouth every 6 (six) hours as needed for mild pain (or Fever >/= 101). 03/05/16  Yes Alison Murray, MD  amiodarone (PACERONE) 200 MG tablet Take 200 mg by mouth daily.   Yes [provider]  apixaban (ELIQUIS) 5 MG TABS tablet Take 5 mg by mouth 2 (two) times daily.   Yes [provider]  carboxymethylcellulose (REFRESH PLUS) 0.5 % SOLN Place 1 drop into both eyes at bedtime.   Yes [provider]  Levothyroxine Sodium (LEVOTHROID PO) Take 1 tablet by mouth daily.    Yes [provider]  losartan (COZAAR) 25 MG tablet Take 12.5 mg  by mouth daily.   Yes [provider]  metoprolol succinate (TOPROL-XL) 50 MG 24 hr tablet Take 75 mg by mouth 2 (two) times daily. Take with or immediately following a meal.   Yes [provider]  mometasone (ASMANEX) 220 MCG/INH inhaler Inhale 2 puffs into the lungs 2 (two) times daily as needed (SOB).    Yes [provider]  Multiple Vitamin (MULTIVITAMIN WITH MINERALS) TABS tablet Take 1 tablet by mouth daily.   Yes [provider]  nitroGLYCERIN (NITROSTAT) 0.4 MG SL tablet Place 0.4 mg under the tongue every 5 (five) minutes x 3 doses as needed for chest pain.   Yes [provider]  omega-3 acid ethyl esters (LOVAZA) 1 g capsule Take 1 capsule (1 g total) by  mouth 2 (two) times daily. 03/05/16  Yes Alison Murrayevine, Alma M, MD  Polyethyl Glycol-Propyl Glycol 0.4-0.3 % SOLN Place 1 drop into both eyes 3 (three) times daily. (SYSTANE DROPS)   Yes [provider]  PRESCRIPTION MEDICATION Inhale 2 puffs into the lungs every 6 (six) hours as needed (wheezing/shortness of breath). (LEVALBUTEROL INHALER)   Yes [provider]  rosuvastatin (CRESTOR) 40 MG tablet Take 40 mg by mouth daily.   Yes [provider]  spironolactone (ALDACTONE) 25 MG tablet Take 1 tablet (25 mg total) by mouth daily. 03/06/16  Yes Graciella Freerillery, Michael Andrew, PA-C  Tiotropium Bromide-Olodaterol (STIOLTO RESPIMAT) 2.5-2.5 MCG/ACT AERS Inhale 2 puffs into the lungs 2 (two) times daily.    Yes [provider]    Physical Exam: Vitals:   06/22/18 0215 06/22/18 0230 06/22/18 0316 06/22/18 0320  BP: (!) 140/92 (!) 146/81 121/61   Pulse: 65 66 71   Resp: 16 19 19    Temp:      TempSrc:      SpO2: 92% 92% (!) 88% 95%    Constitutional: NAD, calm, comfortable Eyes: PERRL, lids and conjunctivae normal ENMT: Mucous membranes are moist. Posterior pharynx clear of any exudate or lesions.Normal dentition.  Neck: normal, supple, no masses, no thyromegaly Respiratory: clear to auscultation bilaterally, no wheezing, no crackles. Normal respiratory effort. No accessory muscle use.  Cardiovascular: Regular rate and rhythm, no murmurs / rubs / gallops. 2+ BLE edema 2+ pedal pulses. No carotid bruits.  Abdomen: no tenderness, no masses palpated. No hepatosplenomegaly. Bowel sounds positive.  Musculoskeletal: no clubbing / cyanosis. No joint deformity upper and lower extremities. Good ROM, no contractures. Normal muscle tone.  Skin: no rashes, lesions, ulcers. No induration Neurologic: CN 2-12 grossly intact. Sensation intact, DTR normal. Strength 5/5 in all 4.  Psychiatric: Normal judgment and insight. Alert and oriented x 3. Normal mood.    Labs on Admission: I have  personally reviewed following labs and imaging studies  CBC: Recent Labs  Lab 06/21/18 2120  WBC 8.8  HGB 16.2  HCT 50.0  MCV 94.7  PLT PLATELET CLUMPS NOTED ON SMEAR, UNABLE TO ESTIMATE   Basic Metabolic Panel: Recent Labs  Lab 06/21/18 2120  NA 135  K 4.1  CL 94*  CO2 28  GLUCOSE 150*  BUN 14  CREATININE 1.57*  CALCIUM 9.0   GFR: CrCl cannot be calculated (Unknown ideal weight.). Liver Function Tests: No results for input(s): AST, ALT, ALKPHOS, BILITOT, PROT, ALBUMIN in the last 168 hours. No results for input(s): LIPASE, AMYLASE in the last 168 hours. No results for input(s): AMMONIA in the last 168 hours. Coagulation Profile: No results for input(s): INR, PROTIME in the last 168 hours. Cardiac  Enzymes: Recent Labs  Lab 06/21/18 2120 06/22/18 0221  TROPONINI <0.03 <0.03   BNP (last 3 results) No results for input(s): PROBNP in the last 8760 hours. HbA1C: No results for input(s): HGBA1C in the last 72 hours. CBG: No results for input(s): GLUCAP in the last 168 hours. Lipid Profile: No results for input(s): CHOL, HDL, LDLCALC, TRIG, CHOLHDL, LDLDIRECT in the last 72 hours. Thyroid Function Tests: No results for input(s): TSH, T4TOTAL, FREET4, T3FREE, THYROIDAB in the last 72 hours. Anemia Panel: No results for input(s): VITAMINB12, FOLATE, FERRITIN, TIBC, IRON, RETICCTPCT in the last 72 hours. Urine analysis: No results found for: COLORURINE, APPEARANCEUR, LABSPEC, PHURINE, GLUCOSEU, HGBUR, BILIRUBINUR, KETONESUR, PROTEINUR, UROBILINOGEN, NITRITE, LEUKOCYTESUR  Radiological Exams on Admission: Dg Chest 2 View  Result Date: 06/21/2018 CLINICAL DATA:  68 year old male with left side chest pain, productive cough. Smoker. Negative for COVID-19 at the end of May. EXAM: CHEST - 2 VIEW COMPARISON:  06/02/2018 and earlier. FINDINGS: Stable lung volumes. Stable cardiac size and mediastinal contours. Stable left chest AICD. Chronic lung disease with hazy mid right lung  and bibasilar pulmonary opacity which is stable from 06/02/2018. Basilar ventilation is improved compared to 2018 radiographs. Chronic blunting of the costophrenic angles. No pneumothorax or pulmonary edema. No new pulmonary opacity. No acute osseous abnormality identified. Negative visible bowel gas pattern. IMPRESSION: Chronic lung disease with unchanged appearance of the lungs since 06/02/2018, no definite acute cardiopulmonary abnormality. Electronically Signed   By: Odessa FlemingH  Hall M.D.   On: 06/21/2018 21:40    EKG: Independently reviewed.  Assessment/Plan Principal Problem:   Acute on chronic systolic CHF (congestive heart failure) (HCC) Active Problems:   Chronic atrial fibrillation   Lung mass   CKD (chronic kidney disease)   ICD (implantable cardioverter-defibrillator) in place    1. Acute on chronic systolic CHF - 1. CHF pathway 2. Lasix 40mg  IV daily ordered (currently on no lasix at home) 3. Continue home aldactone, losartan, and beta blocker 4. 2d echo 5. Interrogate AICD 6. Strict intake and output 7. Serial troponins 8. Tele monitor 9. COVID and procalcitonin pending - though I would have expected him to have some degree of SIRS / fever, etc by this point given that he has had SOB and pulmonary opacities since at least May 21st 2. Chronic A.Fib - 1. Continue beta blocker 2. Cont eliquis 3. Amiodarone 3. CKD stage 3 - daily BMP with diuresis 4. ? Of lung mass back in 2018 - 1. Not calling anything on todays CXR despite over 2 years since this was initially called in 2018 2. Might just have been a post PE infarction as suggested in DC summary Feb 2018 3. If he doesn't improve with Lasix, then would obtain CT chest though.  DVT prophylaxis: Eliquis Code Status: Full Family Communication: No family in room Disposition Plan: Home after admit Consults called: None Admission status: Place in 60obs    Dara Camargo M. DO [facility]  How to contact the [facility]  Attending or Consulting provider 7A - 7P or covering provider during after hours 7P -7A, for this patient?  1. Check the care team in [facility] and look for a) attending/consulting [facility] provider listed and b) the [facility] team listed 2. Log into [email or web address]  Amion Physician Scheduling and messaging for groups and whole hospitals  On call and physician scheduling software for group practices, residents, hospitalists and other medical providers for call, clinic, rotation and shift schedules. On[employer] is a hospital-wide system for scheduling doctors  and paging doctors on call. EasyPlot is for scientific plotting and data analysis.  [email or web address]  and use [facility]'s universal password to access. If you do not have the password, please contact the hospital operator.  3. Locate the [facility] provider you are looking for under [facility] and page to a number that you can be directly reached. 4. If you still have difficulty reaching the provider, please page the [facility] (Director on Call) for the Hospitalists listed on amion for assistance.  06/22/2018, 4:25 AM

## 2018-06-22 NOTE — Progress Notes (Signed)
  Echocardiogram 2D Echocardiogram has been performed.  Max Rivas 06/22/2018, 12:04 PM

## 2018-06-23 DIAGNOSIS — J9611 Chronic respiratory failure with hypoxia: Secondary | ICD-10-CM

## 2018-06-23 DIAGNOSIS — Z9981 Dependence on supplemental oxygen: Secondary | ICD-10-CM

## 2018-06-23 LAB — BASIC METABOLIC PANEL
Anion gap: 11 (ref 5–15)
BUN: 21 mg/dL (ref 8–23)
CO2: 29 mmol/L (ref 22–32)
Calcium: 8.5 mg/dL — ABNORMAL LOW (ref 8.9–10.3)
Chloride: 98 mmol/L (ref 98–111)
Creatinine, Ser: 1.4 mg/dL — ABNORMAL HIGH (ref 0.61–1.24)
GFR calc Af Amer: >60 mL/min (ref >60)
GFR calc non Af Amer: 52 mL/min — ABNORMAL LOW (ref >60)
Glucose, Bld: 95 mg/dL (ref 70–99)
Potassium: 3.6 mmol/L (ref 3.5–5.1)
Sodium: 138 mmol/L (ref 135–145)

## 2018-06-23 MED ORDER — FUROSEMIDE 40 MG PO TABS: 40.0000 mg | ORAL_TABLET | Freq: Every day | ORAL | 11 refills | Status: DC

## 2018-06-23 NOTE — Evaluation (Signed)
Physical Therapy Evaluation Patient Details Name: Max Rivas MRN: [id number] DOB: 29-Apr-1950 Today's Date: 06/23/2018   History of Present Illness  Pt is a 68 y/o male admitted secondary to CHF exacerbation. PMH includes CAD, CKD, a fib, CHF, s/p AICD placement, and tobacco dependence.   Clinical Impression  Pt admitted secondary to problem above with deficits below. Pt overall steady with gait. Pt experiencing SOB and required standing rest X1. Oxygen sats decreasing to 82% on RA during ambulation and required 2L to return to 88-92%. Feel pt will progress well and will not require follow up PT. Will continue to follow acutely to maximize functional mobility independence and safety.     Follow Up Recommendations No PT follow up    Equipment Recommendations  None recommended by PT    Recommendations for Other Services       Precautions / Restrictions Precautions Precautions: Other (comment) Precaution Comments: watch oxygen sats Restrictions Weight Bearing Restrictions: No      Mobility  Bed Mobility               General bed mobility comments: Sitting EOB upon entry   Transfers Overall transfer level: Needs assistance Equipment used: None Transfers: Sit to/from Stand Sit to Stand: Supervision         General transfer comment: Supervision for safety.   Ambulation/Gait Ambulation/Gait assistance: Supervision Gait Distance (Feet): 100 Feet(X2) Assistive device: Rolling walker (2 wheeled) Gait Pattern/deviations: Step-through pattern;Decreased stride length Gait velocity: Decreased    General Gait Details: Slow, guarded gait. Overall steady during gait, however, did experience SOB, requiring 1 standing rest. Oxygen sats decreasing to 82% on RA, and required 2L to maintain sats at 88-92%.   Stairs            Wheelchair Mobility    Modified Rankin (Stroke Patients Only)       Balance Overall balance assessment: Needs assistance Sitting-balance  support: No upper extremity supported;Feet supported Sitting balance-Leahy Scale: Good     Standing balance support: No upper extremity supported;During functional activity Standing balance-Leahy Scale: Fair                               Pertinent Vitals/Pain Pain Assessment: No/denies pain    Home Living Family/patient expects to be discharged to:: Private residence Living Arrangements: Non-relatives/Friends Available Help at Discharge: Friend(s);Available 24 hours/day Type of Home: House Home Access: Stairs to enter Entrance Stairs-Rails: Right;Left;Can reach both Entrance Stairs-Number of Steps: 3 Home Layout: One level Home Equipment: Grab bars - tub/shower      Prior Function Level of Independence: Independent               Hand Dominance        Extremity/Trunk Assessment   Upper Extremity Assessment Upper Extremity Assessment: Overall WFL for tasks assessed    Lower Extremity Assessment Lower Extremity Assessment: Overall WFL for tasks assessed    Cervical / Trunk Assessment Cervical / Trunk Assessment: Normal  Communication   Communication: No difficulties  Cognition Arousal/Alertness: Awake/alert Behavior During Therapy: WFL for tasks assessed/performed Overall Cognitive Status: Within Functional Limits for tasks assessed                                        General Comments General comments (skin integrity, edema, etc.): Educated about energy conservation and importance of  taking rest breaks.     Exercises     Assessment/Plan    PT Assessment Patient needs continued PT services  PT Problem List Decreased strength;Decreased balance;Decreased mobility;Decreased knowledge of use of DME;Decreased knowledge of precautions       PT Treatment Interventions Gait training;DME instruction;Stair training;Functional mobility training;Therapeutic activities;Therapeutic exercise;Balance training;Patient/family education     PT Goals (Current goals can be found in the Care Plan section)  Acute Rehab PT Goals Patient Stated Goal: to go home today PT Goal Formulation: With patient Time For Goal Achievement: 07/07/18 Potential to Achieve Goals: Good    Frequency Min 3X/week   Barriers to discharge        Co-evaluation               AM-PAC PT "6 Clicks" Mobility  Outcome Measure Help needed turning from your back to your side while in a flat bed without using bedrails?: None Help needed moving from lying on your back to sitting on the side of a flat bed without using bedrails?: None Help needed moving to and from a bed to a chair (including a wheelchair)?: A Little Help needed standing up from a chair using your arms (e.g., wheelchair or bedside chair)?: A Little Help needed to walk in hospital room?: A Little Help needed climbing 3-5 steps with a railing? : A Little 6 Click Score: 20    End of Session Equipment Utilized During Treatment: Gait belt Activity Tolerance: Patient tolerated treatment well Patient left: in bed;with call bell/phone within reach(sitting EOB ) Nurse Communication: Mobility status PT Visit Diagnosis: Other abnormalities of gait and mobility (R26.89)    Time: 1572-6203 PT Time Calculation (min) (ACUTE ONLY): 20 min   Charges:   PT Evaluation $PT Eval Low Complexity: 1 Low          Gladys Damme, PT, DPT  Acute Rehabilitation Services  Pager: [phone] Office: [phone]   Lehman Prom 06/23/2018, 11:48 AM

## 2018-06-23 NOTE — TOC Transition Note (Signed)
Transition of Care [facility]) - CM/SW Discharge Note   Patient Details  Name: Max Rivas MRN: [id number] Date of Birth: [birth year]  [facility] CM/SW Contact:  Bethena Roys, RN Phone Number: 06/23/2018, 12:48 PM   Clinical Narrative: Pt presented for Acute on Chronic systolic heart failure-Pt will need DME 02. CM did offer choice for DME and patient chose [employer]. Referral made to [facility] and order in [place]. DME will be delivered to the room prior to transition home. Patient declines [place] at this time. Patient has PCP at the [facility]. Patient gets his medications from the [place] as well. No further needs from CM at this time.     Final next level of care: Home/Self Care Barriers to Discharge: No Barriers Identified   Patient Goals and CMS Choice     Choice offered to / list presented to : NA  Discharge Placement                       Discharge Plan and Services In-house Referral: NA Discharge Planning Services: CM Consult Post Acute Care Choice: Durable Medical Equipment          DME Arranged: Oxygen DME Agency: [facility] Date DME Agency Contacted: 06/23/18 Time DME Agency Contacted: 50 Representative spoke with at DME Agency: [place]: Refused HH(Pt independent from home- declining [place] at this time.)          Social Determinants of Health (SDOH) Interventions     Readmission Risk Interventions No flowsheet data found.

## 2018-06-23 NOTE — Discharge Summary (Signed)
Physician Discharge Summary  Max Rivas VEL:[id number] DOB: Oct 03, 1950 DOA: 06/21/2018  PCP: [name], MD  Admit date: 06/21/2018 Discharge date: 06/23/2018  Time spent: 45 minutes  Recommendations for Outpatient Follow-up:  1. Follow up with cardiology as scheduled in 2 weeks   Discharge Diagnoses:  Principal Problem:   Acute on chronic systolic CHF (congestive heart failure) (HCC) Active Problems:   TOBACCO DEPENDENCE   Coronary artery disease involving native coronary artery of native heart without angina pectoris   Chronic atrial fibrillation   Lung mass   CKD (chronic kidney disease)   Hypothyroidism   ICD (implantable cardioverter-defibrillator) in place   Chronic respiratory failure with hypoxia (HCC)   Congestive heart failure (CHF) ([place])   Discharge Condition: stable  Diet recommendation: heart healthy  Filed Weights   06/22/18 0511 06/23/18 0655  Weight: 113 kg 111.2 kg    History of present illness:  Max Rivas is a 68 y.o. male with medical history significant of CHF with EF 25-30% s/p AICD placement, CAD, PE, A.Fib, on eliquis, CKD presented to the ED on 6/10  with CP described as heaviness.  Symptoms ongoing throughout the day yesterday, similar to prior MIs.  Pain resolved prior to coming to ED.  Associated SOB with exertion and 4 day h/o BLE edema.  Intermittent cough for past couple of weeks though COVID testing was negative when he was last seen in the ED back in May.  Work up that time was noteable for bibasilar opacities, EDP wanted to admit patient due to hypoxia but patient refused.  Hospital Course:  1. Acute on chronic systolic heart failure. Patient with severe known systolic heart failure with EF 25% s/p AICD, in concert with chronic afib. Concerned about ability to manage volume as out patient. Patient needs inpatient monitoring with ongoing IV lasix given volume overload as evidenced by 2+ bilateral pitting edema, jvd, elevated bnp.   Reports compliance with med. Some dietary indiscretions noted. Resolved at discharge. Weight 245 at discharge [name] on admission. Echo with ef 30%. Will discharge with lasix 40mg  po daily and instructions to take extra tablet if LE edema.  2. Chronic afib. Chadvasc score 4. Currently SR -continue amiodorone -continue BB -continue eliquis  3. Chronic respiratory failure with hypoxia in setting of current smoker.  Patient Saturations on Room Air at Rest = 92%  Patient Saturations on [employer] while Ambulating =86%  Patient Saturations on 2 Liters of oxygen while Ambulating = 94%   after walk of 22ft without 02 sats drop 78 and take several minutes to recover to 90 without placing on 2L [place]        Will be discharged with home oxygen   4. CKD stage III. Creatinine 1.57 on admission and 1.40 at discharge.  5. Hx lung mass. May need CT 6 months  Procedures: Echo Suboptimal acoustic windows significantly degrade diagnostic capacity of study.  2. The left ventricle has moderate-severely reduced systolic function, with an ejection fraction of 30-35%. The cavity size was normal. Left ventricular diastolic Doppler parameters are consistent with pseudonormalization. Elevated left ventricular  end-diastolic pressure The E/e' is 16.   3. The inferior vena cava was dilated in size with >50% respiratory variability.  Consultations:    Discharge Exam: Vitals:   06/23/18 0842 06/23/18 0919  BP:  (!) 113/47  Pulse:  60  Resp:  18  Temp:    SpO2: 95%     General: sitting on side of bed no acute  distress Cardiovascular: rrr no mgr trace LE edema Respiratory: normal effort BS distant but clear no crackles  Discharge Instructions   Discharge Instructions    (HEART FAILURE PATIENTS) Call MD:  Anytime you have any of the following symptoms: 1) 3 pound weight gain in 24 hours or 5 pounds in 1 week 2) shortness of breath, with or without a dry hacking cough 3) swelling in the hands,  feet or stomach 4) if you have to sleep on extra pillows at night in order to breathe.   Complete by: As directed    Diet - low sodium heart healthy   Complete by: As directed    Discharge instructions   Complete by: As directed    Take medications as prescribed Follow up with cardiology as scheduled   Heart Failure patients record your daily weight using the same scale at the same time of day   Complete by: As directed    Increase activity slowly   Complete by: As directed      Allergies as of 06/23/2018      Reactions   Crestor [rosuvastatin Calcium] Other (See Comments)   Back spasms      Medication List    TAKE these medications   acetaminophen 325 MG tablet Commonly known as: TYLENOL Take 2 tablets (650 mg total) by mouth every 6 (six) hours as needed for mild pain (or Fever >/= 101).   amiodarone 200 MG tablet Commonly known as: PACERONE Take 200 mg by mouth daily.   carboxymethylcellulose 0.5 % Soln Commonly known as: REFRESH PLUS Place 1 drop into both eyes at bedtime.   Eliquis 5 MG Tabs tablet Generic drug: apixaban Take 5 mg by mouth 2 (two) times daily.   furosemide 40 MG tablet Commonly known as: Lasix Take 1 tablet (40 mg total) by mouth daily. If lower extremity edema take 1 additional tablet   LEVOTHROID PO Take 1 tablet by mouth daily.   losartan 25 MG tablet Commonly known as: COZAAR Take 12.5 mg by mouth daily.   metoprolol succinate 50 MG 24 hr tablet Commonly known as: TOPROL-XL Take 75 mg by mouth 2 (two) times daily. Take with or immediately following a meal.   mometasone 220 MCG/INH inhaler Commonly known as: ASMANEX Inhale 2 puffs into the lungs 2 (two) times daily as needed (SOB).   multivitamin with minerals Tabs tablet Take 1 tablet by mouth daily.   nitroGLYCERIN 0.4 MG SL tablet Commonly known as: NITROSTAT Place 0.4 mg under the tongue every 5 (five) minutes x 3 doses as needed for chest pain.   omega-3 acid ethyl esters 1  g capsule Commonly known as: LOVAZA Take 1 capsule (1 g total) by mouth 2 (two) times daily.   Polyethyl Glycol-Propyl Glycol 0.4-0.3 % Soln Place 1 drop into both eyes 3 (three) times daily. (SYSTANE DROPS)   PRESCRIPTION MEDICATION Inhale 2 puffs into the lungs every 6 (six) hours as needed (wheezing/shortness of breath). (LEVALBUTEROL INHALER)   rosuvastatin 40 MG tablet Commonly known as: CRESTOR Take 40 mg by mouth daily.   spironolactone 25 MG tablet Commonly known as: ALDACTONE Take 1 tablet (25 mg total) by mouth daily.   Stiolto Respimat 2.5-2.5 MCG/ACT Aers Generic drug: Tiotropium Bromide-Olodaterol Inhale 2 puffs into the lungs 2 (two) times daily.            Durable Medical Equipment  (From admission, onward)         Start     Ordered  06/23/18 1217  For home use only DME oxygen  Once    Question Answer Comment  Length of Need 6 Months   Mode or (Route) Nasal cannula   Liters per Minute 2   Oxygen delivery system Gas      06/23/18 1218         Allergies  Allergen Reactions  . Crestor [Rosuvastatin Calcium] Other (See Comments)    Back spasms   Follow-up Information    [facility] Jillyn HiddenFulp Follow up.        Anson FretJones, Christopher, MD Follow up.   Specialty: Family Medicine Contact information: [address] [place] [address] [phone]        Azucena FallenFulp, Andrew G, PA-C Follow up.   Specialty: Physician Assistant Contact information: [address] [place] [address] [phone]            The results of significant diagnostics from this hospitalization (including imaging, microbiology, ancillary and laboratory) are listed below for reference.    Significant Diagnostic Studies: Dg Chest 2 View  Result Date: 06/21/2018 CLINICAL DATA:  68 year old male with left side chest pain, productive cough. Smoker. Negative for COVID-19 at the end of May. EXAM: CHEST - 2 VIEW  COMPARISON:  06/02/2018 and earlier. FINDINGS: Stable lung volumes. Stable cardiac size and mediastinal contours. Stable left chest AICD. Chronic lung disease with hazy mid right lung and bibasilar pulmonary opacity which is stable from 06/02/2018. Basilar ventilation is improved compared to 2018 radiographs. Chronic blunting of the costophrenic angles. No pneumothorax or pulmonary edema. No new pulmonary opacity. No acute osseous abnormality identified. Negative visible bowel gas pattern. IMPRESSION: Chronic lung disease with unchanged appearance of the lungs since 06/02/2018, no definite acute cardiopulmonary abnormality. Electronically Signed   By: Odessa FlemingH  Hall M.D.   On: 06/21/2018 21:40   Dg Chest 2 View  Result Date: 06/02/2018 CLINICAL DATA:  Short of breath and productive cough EXAM: CHEST - 2 VIEW COMPARISON:  02/25/2016 FINDINGS: Left subclavian AICD device is stable. Leads are intact. Normal heart size. Bibasilar heterogeneous opacities and haziness have improved indicating improved pleural effusions and airspace opacities. Pleural thickening and pleural calcifications in the right hemithorax are stable indicating some degree of chronic disease. Vascular congestion has also improved. No sign of interstitial edema. No pneumothorax. IMPRESSION: Bilateral pleural effusions and bibasilar pulmonary opacities have improved. Vascular congestion persists but is improved. Electronically Signed   By: Jolaine ClickArthur  Hoss M.D.   On: 06/02/2018 17:16   Dg Chest Port 1 View  Result Date: 06/02/2018 CLINICAL DATA:  68 year old male shortness of breath for 1 week, progressive. COVID-19\. Status pending. EXAM: PORTABLE CHEST 1 VIEW COMPARISON:  1658 hours today. FINDINGS: Portable AP semi upright view at 1908 hours. There is asymmetric increased interstitial opacity in the mid and lower lungs. As before, no pneumothorax or suspected pulmonary edema. Chronic blunting of the right costophrenic angle suspected rather than small  acute effusion. Stable cardiac size and mediastinal contours. Left chest AICD. Visualized tracheal air column is within normal limits. IMPRESSION: Stable asymmetric bilateral mid and lower lung pulmonary interstitial opacity suspicious for acute viral respiratory infection in this clinical setting. Electronically Signed   By: Odessa FlemingH  Hall M.D.   On: 06/02/2018 19:47    Microbiology: Recent Results (from the past 240 hour(s))  SARS Coronavirus 2     Status: None   Collection Time: 06/22/18  4:29 AM  Result Value Ref Range Status   SARS Coronavirus  2 NOT DETECTED NOT DETECTED Final    Comment: (NOTE) SARS-CoV-2 target nucleic acids are NOT DETECTED. The SARS-CoV-2 RNA is generally detectable in upper and lower respiratory specimens during the acute phase of infection.  Negative  results do not preclude SARS-CoV-2 infection, do not rule out co-infections with other pathogens, and should not be used as the sole basis for treatment or other patient management decisions.  Negative results must be combined with clinical observations, patient history, and epidemiological information. The expected result is Not Detected. Fact Sheet for Patients: http://www.biofiredefense.com/wp-content/uploads/2020/03/BIOFIRE-COVID -19-patients.pdf Fact Sheet for Healthcare Providers: http://www.biofiredefense.com/wp-content/uploads/2020/03/BIOFIRE-COVID -19-hcp.pdf This test is not yet approved or cleared by the [place] and  has been authorized for detection and/or diagnosis of SARS-CoV-2 by FDA under an Emergency Use Authorization (EUA).  This EUA will remain in effec t (meaning this test can be used) for the duration of  the COVID-19 declaration under Section 564(b)(1) of the Act, 21 U.S.C. section 360bbb-3(b)(1), unless the authorization is terminated or revoked sooner. Performed at [facility], [address] [place], [place] [address]      Labs: Basic Metabolic Panel: Recent Labs   Lab 06/21/18 2120 06/23/18 0335  NA 135 138  K 4.1 3.6  CL 94* 98  CO2 28 29  GLUCOSE 150* 95  BUN 14 21  CREATININE 1.57* 1.40*  CALCIUM 9.0 8.5*   Liver Function Tests: No results for input(s): AST, ALT, ALKPHOS, BILITOT, PROT, ALBUMIN in the last 168 hours. No results for input(s): LIPASE, AMYLASE in the last 168 hours. No results for input(s): AMMONIA in the last 168 hours. CBC: Recent Labs  Lab 06/21/18 2120  WBC 8.8  HGB 16.2  HCT 50.0  MCV 94.7  PLT PLATELET CLUMPS NOTED ON SMEAR, UNABLE TO ESTIMATE   Cardiac Enzymes: Recent Labs  Lab 06/21/18 2120 06/22/18 0221 06/22/18 0524 06/22/18 1103 06/22/18 1842  TROPONINI <0.03 <0.03 <0.03 <0.03 <0.03   BNP: BNP (last 3 results) Recent Labs    06/02/18 1824 06/21/18 2120  BNP 250.4* 210.5*    ProBNP (last 3 results) No results for input(s): PROBNP in the last 8760 hours.  CBG: No results for input(s): GLUCAP in the last 168 hours.     SignedGwenyth Bender NP [facility] 06/23/2018, 12:31 PM

## 2018-06-23 NOTE — Plan of Care (Signed)
  Problem: Clinical Measurements: Goal: Respiratory complications will improve Outcome: Progressing Note:  No s/s of respiratory complications.  Stable on room air.

## 2018-06-23 NOTE — Progress Notes (Signed)
At patient bedside with spouse on phone to discuss discharge instructions and new 02 use - Both verbalize understanding

## 2018-06-23 NOTE — Progress Notes (Signed)
SATURATION QUALIFICATIONS: (This note is used to comply with regulatory documentation for home oxygen)  Patient Saturations on Room Air at Rest = 92%  Patient Saturations on Room Air while Ambulating =86%  Patient Saturations on 2 Liters of oxygen while Ambulating = 94%  Please briefly explain why patient needs home oxygen: after walk of 32ft without 02 sats drop 78 and take several minutes to recover to 90 without placing on 2L [place]

## 2018-07-08 ENCOUNTER — Ambulatory Visit (INDEPENDENT_AMBULATORY_CARE_PROVIDER_SITE_OTHER): Payer: Medicare Other | Admitting: Emergency Medicine

## 2018-07-08 ENCOUNTER — Encounter: Payer: Self-pay | Admitting: Emergency Medicine

## 2018-07-08 ENCOUNTER — Other Ambulatory Visit: Payer: Self-pay

## 2018-07-08 VITALS — BP 126/78 | HR 64

## 2018-07-08 DIAGNOSIS — I2699 Other pulmonary embolism without acute cor pulmonale: Secondary | ICD-10-CM | POA: Diagnosis not present

## 2018-07-08 DIAGNOSIS — F17200 Nicotine dependence, unspecified, uncomplicated: Secondary | ICD-10-CM

## 2018-07-08 DIAGNOSIS — R918 Other nonspecific abnormal finding of lung field: Secondary | ICD-10-CM | POA: Diagnosis not present

## 2018-07-08 DIAGNOSIS — R0602 Shortness of breath: Secondary | ICD-10-CM | POA: Diagnosis not present

## 2018-07-08 DIAGNOSIS — J449 Chronic obstructive pulmonary disease, unspecified: Secondary | ICD-10-CM

## 2018-07-08 DIAGNOSIS — J9611 Chronic respiratory failure with hypoxia: Secondary | ICD-10-CM

## 2018-07-08 NOTE — Assessment & Plan Note (Signed)
He has documented hypoxemia in the past.  Will do a walking oximetry today to confirm that he drops, qualifies for oxygen.  He is trying to get this through the [place] and his insurance to make sure that it will be covered.  I will order oxygen today but I have encouraged him to continue to pursue because I do believe he needs it with all exertion

## 2018-07-08 NOTE — Progress Notes (Signed)
Subjective:    Patient ID: Max Rivas, male    DOB: [birth year], 68 y.o.   MRN: [id number]  HPI 68 year old smoker (100 pack years), continues to smoke approximately 6-7 cig a day, has a history of coronary artery disease with ischemic cardiomyopathy EF 30 to 35% with an implantable defibrillator, chronic atrial fibrillation on Eliquis and amiodarone, chronic renal insufficiency, hypothyroidism. He has been admitted with acute CHF, treated with milrinone in the past.  He has a history of pulmonary embolism from February 2018.  When he was diagnosed with his pulmonary embolism in February 2018 there was some question on CT scan of a right infrahilar mass with narrowing of his right lower lobe bronchus and some postobstructive change, possibly also impacting the pulmonary artery.  There was an indeterminate 7 mm right upper lobe nodule and possible lymphadenopathy as well.  He was admitted 6/9-6/11 2020 with acute respiratory failure in the setting of acute on chronic systolic CHF. Currently managed on Stiolto and asmanex. On xopenex to avoid tachycardia. Uses rarely.   Referred today for follow up of his COPD and CT chest. His last PFT was 2016 at the [facility]. Has some progressive exertional SOB, coughs w exertion prod of minimal clear. Hears wheeze occasionally. Has already flared 2x this year - pred and abx. Has desaturated before, doesn't have O2 at this time. Was sent with him home from hospital.    Review of Systems  Constitutional: Negative for fever and unexpected weight change.  HENT: Negative for congestion, dental problem, ear pain, nosebleeds, postnasal drip, rhinorrhea, sinus pressure, sneezing, sore throat and trouble swallowing.   Eyes: Negative for redness and itching.  Respiratory: Positive for cough and shortness of breath. Negative for chest tightness and wheezing.   Cardiovascular: Negative for palpitations and leg swelling.  Gastrointestinal: Negative for nausea and vomiting.   Genitourinary: Negative for dysuria.  Musculoskeletal: Negative for joint swelling.  Skin: Negative for rash.  Neurological: Negative for headaches.  Hematological: Does not bruise/bleed easily.  Psychiatric/Behavioral: Negative for dysphoric mood. The patient is not nervous/anxious.     Past Medical History:  Diagnosis Date  . Arthritis    "elbows, knees" (02/24/2016)  . CHF (congestive heart failure) (HCC)   . COPD (chronic obstructive pulmonary disease) (HCC)   . Coronary artery disease   . GERD (gastroesophageal reflux disease)   . High cholesterol   . Myocardial infarction [facility](HCC) ~ 1995 X 2;1998; 2000; 2004  . On home oxygen therapy    "2L w/activity" (02/24/2016)  . Pacemaker   . Pulmonary embolism (HCC) 02/24/2016  . Sleep apnea    "have CPAP; can't tolerate it" (02/24/2016)     Family History  Problem Relation Age of Onset  . Heart attack Mother   . Heart attack Father   . Heart attack Sister [id number]     Social History   Socioeconomic History  . Marital status: Divorced    Spouse name: Not on file  . Number of children: Not on file  . Years of education: Not on file  . Highest education level: Not on file  Occupational History  . Not on file  Social Needs  . Financial resource strain: Not on file  . Food insecurity    Worry: Not on file    Inability: Not on file  . Transportation needs    Medical: Not on file    Non-medical: Not on file  Tobacco Use  . Smoking status: Current Some Day Smoker  Packs/day: 3.00    Years: 35.00    Pack years: 105.00    Types: Cigarettes  . Smokeless tobacco: Never Used  . Tobacco comment: 02/24/2016 "stopped ~ 8-9 months ago; have had 5 cigarettes in the last week"  Substance and Sexual Activity  . Alcohol use: No    Comment: 02/24/2016 "none in a long time"  . Drug use: Yes    Types: Cocaine    Comment: 02/24/2016 "none since 1995"  . Sexual activity: Yes  Lifestyle  . Physical activity    Days per week: Not on file     Minutes per session: Not on file  . Stress: Not on file  Relationships  . Social Herbalist on phone: Not on file    Gets together: Not on file    Attends religious service: Not on file    Active member of club or organization: Not on file    Attends meetings of clubs or organizations: Not on file    Relationship status: Not on file  . Intimate partner violence    Fear of current or ex partner: Not on file    Emotionally abused: Not on file    Physically abused: Not on file    Forced sexual activity: Not on file  Other Topics Concern  . Not on file  Social History Narrative  . 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 Asbestos exposure.   Allergies  Allergen Reactions  . Crestor [Rosuvastatin Calcium] Other (See Comments)    Back spasms     Outpatient Medications Prior to Visit  Medication Sig Dispense Refill  . acetaminophen (TYLENOL) 325 MG tablet Take 2 tablets (650 mg total) by mouth every 6 (six) hours as needed for mild pain (or Fever >/= 101). 30 tablet 0  . amiodarone (PACERONE) 200 MG tablet Take 200 mg by mouth daily.    Marland Kitchen apixaban (ELIQUIS) 5 MG TABS tablet Take 5 mg by mouth 2 (two) times daily.    . carboxymethylcellulose (REFRESH PLUS) 0.5 % SOLN Place 1 drop into both eyes at bedtime.    . furosemide (LASIX) 40 MG tablet Take 1 tablet (40 mg total) by mouth daily. If lower extremity edema take 1 additional tablet 30 tablet 11  . Levothyroxine Sodium (LEVOTHROID PO) Take 1 tablet by mouth daily.     Marland Kitchen losartan (COZAAR) 25 MG tablet Take 12.5 mg by mouth daily.    . metoprolol succinate (TOPROL-XL) 50 MG 24 hr tablet Take 75 mg by mouth 2 (two) times daily. Take with or immediately following a meal.    . mometasone (ASMANEX) 220 MCG/INH inhaler Inhale 2 puffs into the lungs 2 (two) times daily as needed (SOB).     . Multiple Vitamin (MULTIVITAMIN WITH MINERALS) TABS tablet  Take 1 tablet by mouth daily.    . nitroGLYCERIN (NITROSTAT) 0.4 MG SL tablet Place 0.4 mg under the tongue every 5 (five) minutes x 3 doses as needed for chest pain.    Marland Kitchen omega-3 acid ethyl esters (LOVAZA) 1 g capsule Take 1 capsule (1 g total) by mouth 2 (two) times daily. 60 capsule 0  . Polyethyl Glycol-Propyl Glycol 0.4-0.3 % SOLN Place 1 drop into both eyes 3 (three) times daily. (SYSTANE DROPS)    . PRESCRIPTION MEDICATION Inhale 2 puffs into the lungs every 6 (six) hours as needed (wheezing/shortness of  breath). (LEVALBUTEROL INHALER)    . rosuvastatin (CRESTOR) 40 MG tablet Take 40 mg by mouth daily.    Marland Kitchen spironolactone (ALDACTONE) 25 MG tablet Take 1 tablet (25 mg total) by mouth daily. 30 tablet 6  . Tiotropium Bromide-Olodaterol (STIOLTO RESPIMAT) 2.5-2.5 MCG/ACT AERS Inhale 2 puffs into the lungs 2 (two) times daily.      No facility-administered medications prior to visit.    \     Objective:   Physical Exam Vitals:   07/08/18 0850  BP: 126/78  Pulse: 64  SpO2: 90%   Gen: Pleasant, overwt man, in no distress,  normal affect  ENT: No lesions,  mouth clear,  oropharynx clear, no postnasal drip  Neck: No JVD, no stridor  Lungs: No use of accessory muscles, somewhat distant, no crackles or wheezing on normal respiration, no wheeze on forced expiration  Cardiovascular: RRR, heart sounds normal, no murmur or gallops, 2+ pitting ankle peripheral edema  Musculoskeletal: No deformities, no cyanosis or clubbing  Neuro: alert, awake, non focal  Skin: Warm, no lesions or rash     Assessment & Plan:  Pulmonary embolus (HCC) Diagnosed 2018.  He is on Eliquis for atrial fibrillation, should be protected from recurrent VTE  Chronic respiratory failure with hypoxia (HCC) He has documented hypoxemia in the past.  Will do a walking oximetry today to confirm that he drops, qualifies for oxygen.  He is trying to get this through the [place] and his insurance to make sure that it will  be covered.  I will order oxygen today but I have encouraged him to continue to pursue because I do believe he needs it with all exertion  COPD (chronic obstructive pulmonary disease) (HCC) Presumed significant COPD, treated with Stiolto and Asmanex.  He uses levalbuterol rarely.  Continue same regimen.  Need to concentrate on smoking cessation  Tobacco dependence Discussed cessation with him today.  He has made progress, currently smoking only 6 or 7 cigarettes daily.  He is interested in nicotine replacement therapy, is talking to the [place] about this  Lung mass Right hilar opacity noted on CT scan of the chest February 2018.  There is been no follow-up to look for interval change, resolution, stability.  He needs a CT chest with contrast now.  Depending on the results we will need to arrange bronchoscopy to further evaluate.  Levy Pupa, MD, PhD 07/08/2018, 9:35 AM [facility] [phone] or if no answer [phone]

## 2018-07-08 NOTE — Assessment & Plan Note (Signed)
Presumed significant COPD, treated with Stiolto and Asmanex.  He uses levalbuterol rarely.  Continue same regimen.  Need to concentrate on smoking cessation

## 2018-07-08 NOTE — Patient Instructions (Signed)
Please continue your stiolto and asmanex as you have been taking them  Keep levalbuterol available to use 2 puffs  Walking oximetry on room air today We will perform a CT chest  Follow with Dr Lamonte Sakai

## 2018-07-08 NOTE — Assessment & Plan Note (Signed)
Discussed cessation with him today.  He has made progress, currently smoking only 6 or 7 cigarettes daily.  He is interested in nicotine replacement therapy, is talking to the [place] about this

## 2018-07-08 NOTE — Addendum Note (Signed)
Addended by: Desmond Dike C on: 07/08/2018 11:57 AM   Modules accepted: Orders

## 2018-07-08 NOTE — Assessment & Plan Note (Signed)
Diagnosed 2018.  He is on Eliquis for atrial fibrillation, should be protected from recurrent VTE

## 2018-07-08 NOTE — Assessment & Plan Note (Signed)
Right hilar opacity noted on CT scan of the chest February 2018.  There is been no follow-up to look for interval change, resolution, stability.  He needs a CT chest with contrast now.  Depending on the results we will need to arrange bronchoscopy to further evaluate.

## 2018-07-22 ENCOUNTER — Telehealth: Payer: Self-pay | Admitting: Emergency Medicine

## 2018-07-22 NOTE — Telephone Encounter (Signed)
Script Screening patients for COVID-19 and reviewing new operational procedures  Greeting - The reason I am calling is to share with you some new changes to our processes that are designed to help [place] keep everyone safe. Is now a good time to speak with you? Patient says "no' - ask them when you can call back and let them know it's important to do this prior to their appointment.  Patient says "yes" - Doristine Devoid, merlen the first thing I need to do is ask you some screening Questions.  1. To the best of your knowledge, have you been in close contact with any one with a confirmed diagnosis of COVID 19? o No - proceed to next question  2. Have you had any one or more of the following: fever, chills, cough, shortness of breath or any flu-like symptoms? o No - proceed to next question  3. Have you been diagnosed with or have a previous diagnosis of COVID 19? o No - proceed to next question  4. I am going to go over a few other symptoms with you. Please let me know if you are experiencing any of the following: . Ear, nose or throat discomfort . A sore throat . Headache . Muscle pain . Diarrhea . Loss of taste or smell o No - proceed to next question  Thank you for answering these questions. Please know we will ask you these questions or similar questions when you arrive for your appointment and again it's how we are keeping everyone safe. Also, to keep you safe, please use the provided hand sanitizer when you enter the building. (Insert pt name), we are asking everyone in the building to wear a mask because they help [place] prevent the spread of germs. Do you have a mask of your own, if not, we are happy to provide one for you. The last thing I want to go over with you is the no visitor guidelines. This means no one can attend the appointment with you unless you need physical assistance. I understand this may be different from your past appointments and I know this may be difficult but please  know if someone is driving you we are happy to call them for you once your appointment is over.  [INSERT Forbes  (Insert pt name) I've given you a lot of information, what questions do you have about what I've talked about today or your appointment tomorrow?

## 2018-07-25 ENCOUNTER — Ambulatory Visit (INDEPENDENT_AMBULATORY_CARE_PROVIDER_SITE_OTHER)
Admission: RE | Admit: 2018-07-25 | Discharge: 2018-07-25 | Disposition: A | Discharge status: Home / Self Care | Payer: Medicare Other | Source: Ambulatory Visit | Attending: Emergency Medicine | Admitting: Emergency Medicine

## 2018-07-25 ENCOUNTER — Other Ambulatory Visit: Payer: Self-pay

## 2018-07-25 DIAGNOSIS — R918 Other nonspecific abnormal finding of lung field: Secondary | ICD-10-CM

## 2018-07-25 MED ORDER — IOHEXOL 300 MG/ML  SOLN
80.0000 mL | Freq: Once | INTRAMUSCULAR | Status: AC | PRN
  Administered 2018-07-25: 80 mL via INTRAVENOUS

## 2018-07-26 ENCOUNTER — Ambulatory Visit: Payer: Medicare Other | Admitting: Emergency Medicine

## 2018-07-26 ENCOUNTER — Encounter: Payer: Self-pay | Admitting: Emergency Medicine

## 2018-07-26 VITALS — BP 114/64 | HR 60 | Ht 71.0 in | Wt 250.0 lb

## 2018-07-26 DIAGNOSIS — J449 Chronic obstructive pulmonary disease, unspecified: Secondary | ICD-10-CM

## 2018-07-26 DIAGNOSIS — F17200 Nicotine dependence, unspecified, uncomplicated: Secondary | ICD-10-CM | POA: Diagnosis not present

## 2018-07-26 DIAGNOSIS — J929 Pleural plaque without asbestos: Secondary | ICD-10-CM

## 2018-07-26 NOTE — Patient Instructions (Addendum)
Congratulations on decreasing your cigarettes.  Please continue to work hard on stopping altogether. Continue Stiolto and Asmanex as you have been taking them. Keep your levalbuterol (Xopenex) available to use 2 puffs up to every 6 hours if needed for shortness of breath, chest tightness, wheezing. We will repeat your pulmonary function testing and review the results together Your CT scan of the chest shows some chronic scarring and calcification around the lungs consistent with asbestos exposure.  The right mid lung lesion that we were following from 2018 has resolved.  This is good news.  We will discuss the timing of any repeat scans as we go forward at follow-up visits You need the flu shot in the fall Follow with Dr. Lamonte Sakai next available with full pulmonary function testing on the same day.

## 2018-07-26 NOTE — Assessment & Plan Note (Signed)
Continue Stiolto and Asmanex as you have been taking them. Keep your levalbuterol (Xopenex) available to use 2 puffs up to every 6 hours if needed for shortness of breath, chest tightness, wheezing. We will repeat your pulmonary function testing and review the results together

## 2018-07-26 NOTE — Progress Notes (Signed)
Subjective:    Patient ID: Max Rivas, male    DOB: [birth year], 68 y.o.   MRN: [id number]  HPI 68 year old smoker (100 pack years), continues to smoke approximately 6-7 cig a day, has a history of coronary artery disease with ischemic cardiomyopathy EF 30 to 35% with an implantable defibrillator, chronic atrial fibrillation on Eliquis and amiodarone, chronic renal insufficiency, hypothyroidism. He has been admitted with acute CHF, treated with milrinone in the past.  He has a history of pulmonary embolism from February 2018.  When he was diagnosed with his pulmonary embolism in February 2018 there was some question on CT scan of a right infrahilar mass with narrowing of his right lower lobe bronchus and some postobstructive change, possibly also impacting the pulmonary artery.  There was an indeterminate 7 mm right upper lobe nodule and possible lymphadenopathy as well.  He was admitted 6/9-6/11 2020 with acute respiratory failure in the setting of acute on chronic systolic CHF. Currently managed on Stiolto and asmanex. On xopenex to avoid tachycardia. Uses rarely.   Referred today for follow up of his COPD and CT chest. His last PFT was 2016 at the [facility]. Has some progressive exertional SOB, coughs w exertion prod of minimal clear. Hears wheeze occasionally. Has already flared 2x this year - pred and abx. Has desaturated before, doesn't have O2 at this time. Was sent with him home from hospital.   ROV 07/26/2018 --follow-up visit for 68 year old gentleman with a history of tobacco use, presumed COPD on Stiolto with Asmanex, chronic hypoxemia, pulmonary embolism.  He also has a history of a right hilar opacity by remote CT scan of the chest.  At our last visit we arrange for repeat scan which was done on 7/13 which I have reviewed.  This shows interval resolution of the right infrahilar opacity and mediastinal lymphadenopathy, chronic right pleural thickening and calcification with some associated right  lower lobe atelectasis.  He may have had a remote asbestos exposure when he worked [employer]. Has some limited exertional tolerance esp w the humidity. Rare xopenex use. He is smoking less - about 1-2 cig a day.     Review of Systems  Constitutional: Negative for fever and unexpected weight change.  HENT: Negative for congestion, dental problem, ear pain, nosebleeds, postnasal drip, rhinorrhea, sinus pressure, sneezing, sore throat and trouble swallowing.   Eyes: Negative for redness and itching.  Respiratory: Positive for cough and shortness of breath. Negative for chest tightness and wheezing.   Cardiovascular: Negative for palpitations and leg swelling.  Gastrointestinal: Negative for nausea and vomiting.  Genitourinary: Negative for dysuria.  Musculoskeletal: Negative for joint swelling.  Skin: Negative for rash.  Neurological: Negative for headaches.  Hematological: Does not bruise/bleed easily.  Psychiatric/Behavioral: Negative for dysphoric mood. The patient is not nervous/anxious.     Past Medical History:  Diagnosis Date  . Arthritis    "elbows, knees" (02/24/2016)  . CHF (congestive heart failure) (HCC)   . COPD (chronic obstructive pulmonary disease) (HCC)   . Coronary artery disease   . GERD (gastroesophageal reflux disease)   . High cholesterol   . Myocardial infarction [facility](HCC) ~ 1995 X 2;1998; 2000; 2004  . On home oxygen therapy    "2L w/activity" (02/24/2016)  . Pacemaker   . Pulmonary embolism (HCC) 02/24/2016  . Sleep apnea    "have CPAP; can't tolerate it" (02/24/2016)     Family History  Problem Relation Age of Onset  . Heart attack Mother   .  Heart attack Father   . Heart attack Sister 42     Social History   Socioeconomic History  . Marital status: Divorced    Spouse name: Not on file  . Number of children: Not on file  . Years of education: Not on file  . Highest education level: Not on file  Occupational History  . Not on file  Social Needs   . Financial resource strain: Not on file  . Food insecurity    Worry: Not on file    Inability: Not on file  . Transportation needs    Medical: Not on file    Non-medical: Not on file  Tobacco Use  . Smoking status: Current Some Day Smoker    Packs/day: 3.00    Years: 35.00    Pack years: 105.00    Types: Cigarettes  . Smokeless tobacco: Never Used  . Tobacco comment: 02/24/2016 "stopped ~ 8-9 months ago; have had 5 cigarettes in the last week"  Substance and Sexual Activity  . Alcohol use: No    Comment: 02/24/2016 "none in a long time"  . Drug use: Yes    Types: Cocaine    Comment: 02/24/2016 "none since 1995"  . Sexual activity: Yes  Lifestyle  . Physical activity    Days per week: Not on file    Minutes per session: Not on file  . Stress: Not on file  Relationships  . Social Herbalist on phone: Not on file    Gets together: Not on file    Attends religious service: Not on file    Active member of club or organization: Not on file    Attends meetings of clubs or organizations: Not on file    Relationship status: Not on file  . Intimate partner violence    Fear of current or ex partner: Not on file    Emotionally abused: Not on file    Physically abused: Not on file    Forced sexual activity: Not on file  Other Topics Concern  . Not on file  Social History Narrative  . 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 Asbestos exposure.   Allergies  Allergen Reactions  . Crestor [Rosuvastatin Calcium] Other (See Comments)    Back spasms     Outpatient Medications Prior to Visit  Medication Sig Dispense Refill  . acetaminophen (TYLENOL) 325 MG tablet Take 2 tablets (650 mg total) by mouth every 6 (six) hours as needed for mild pain (or Fever >/= 101). 30 tablet 0  . amiodarone (PACERONE) 200 MG tablet Take 200 mg by mouth daily.    Marland Kitchen apixaban (ELIQUIS) 5 MG TABS tablet Take 5 mg  by mouth 2 (two) times daily.    . carboxymethylcellulose (REFRESH PLUS) 0.5 % SOLN Place 1 drop into both eyes at bedtime.    . furosemide (LASIX) 40 MG tablet Take 1 tablet (40 mg total) by mouth daily. If lower extremity edema take 1 additional tablet 30 tablet 11  . Levothyroxine Sodium (LEVOTHROID PO) Take 1 tablet by mouth daily.     Marland Kitchen losartan (COZAAR) 25 MG tablet Take 12.5 mg by mouth daily.    . metoprolol succinate (TOPROL-XL) 50 MG 24 hr tablet Take 75 mg by mouth 2 (two) times daily. Take with or immediately following a meal.    . mometasone (ASMANEX) 220 MCG/INH inhaler  Inhale 2 puffs into the lungs 2 (two) times daily as needed (SOB).     . Multiple Vitamin (MULTIVITAMIN WITH MINERALS) TABS tablet Take 1 tablet by mouth daily.    . nitroGLYCERIN (NITROSTAT) 0.4 MG SL tablet Place 0.4 mg under the tongue every 5 (five) minutes x 3 doses as needed for chest pain.    Marland Kitchen omega-3 acid ethyl esters (LOVAZA) 1 g capsule Take 1 capsule (1 g total) by mouth 2 (two) times daily. 60 capsule 0  . Polyethyl Glycol-Propyl Glycol 0.4-0.3 % SOLN Place 1 drop into both eyes 3 (three) times daily. (SYSTANE DROPS)    . PRESCRIPTION MEDICATION Inhale 2 puffs into the lungs every 6 (six) hours as needed (wheezing/shortness of breath). (LEVALBUTEROL INHALER)    . rosuvastatin (CRESTOR) 40 MG tablet Take 40 mg by mouth daily.    Marland Kitchen spironolactone (ALDACTONE) 25 MG tablet Take 1 tablet (25 mg total) by mouth daily. 30 tablet 6  . Tiotropium Bromide-Olodaterol (STIOLTO RESPIMAT) 2.5-2.5 MCG/ACT AERS Inhale 2 puffs into the lungs 2 (two) times daily.      No facility-administered medications prior to visit.    \     Objective:   Physical Exam Vitals:   07/26/18 1048  BP: 114/64  Pulse: 60  SpO2: 91%  Weight: 250 lb (113.4 kg)  Height: 5\' 11"  (1.803 m)   Gen: Pleasant, overwt man, in no distress,  normal affect  ENT: No lesions,  mouth clear,  oropharynx clear, no postnasal drip  Neck: No JVD,  no stridor  Lungs: No use of accessory muscles, he does have some wheeze on normal breath and also forced exp  Cardiovascular: RRR, heart sounds normal, no murmur or gallops, 2+ pitting ankle peripheral edema  Musculoskeletal: No deformities, no cyanosis or clubbing  Neuro: alert, awake, non focal  Skin: Warm, no lesions or rash     Assessment & Plan:  Tobacco dependence Congratulations on decreasing your cigarettes.  Please continue to work hard on stopping altogether.  COPD (chronic obstructive pulmonary disease) (HCC) Continue Stiolto and Asmanex as you have been taking them. Keep your levalbuterol (Xopenex) available to use 2 puffs up to every 6 hours if needed for shortness of breath, chest tightness, wheezing. We will repeat your pulmonary function testing and review the results together  Pleural plaque Your CT scan of the chest shows some chronic scarring and calcification around the lungs consistent with asbestos exposure.  The right mid lung lesion that we were following from 2018 has resolved.  This is good news.  We will discuss the timing of any repeat scans as we go forward at follow-up visits   Levy Pupa, MD, PhD 07/26/2018, 11:21 AM [facility] [phone] or if no answer [phone]

## 2018-07-26 NOTE — Assessment & Plan Note (Signed)
Congratulations on decreasing your cigarettes.  Please continue to work hard on stopping altogether.

## 2018-07-26 NOTE — Assessment & Plan Note (Signed)
Your CT scan of the chest shows some chronic scarring and calcification around the lungs consistent with asbestos exposure.  The right mid lung lesion that we were following from 2018 has resolved.  This is good news.  We will discuss the timing of any repeat scans as we go forward at follow-up visits

## 2018-08-04 ENCOUNTER — Ambulatory Visit: Payer: Medicare Other | Admitting: Family Medicine

## 2018-10-03 ENCOUNTER — Telehealth: Payer: Self-pay | Admitting: Emergency Medicine

## 2018-10-04 NOTE — Telephone Encounter (Signed)
Pft scheduled on 11/25-pr

## 2018-12-03 ENCOUNTER — Other Ambulatory Visit (HOSPITAL_COMMUNITY)
Admission: RE | Admit: 2018-12-03 | Discharge: 2018-12-03 | Disposition: A | Discharge status: Home / Self Care | Payer: Medicare Other | Source: Ambulatory Visit | Attending: Emergency Medicine | Admitting: Emergency Medicine

## 2018-12-03 DIAGNOSIS — Z01812 Encounter for preprocedural laboratory examination: Secondary | ICD-10-CM | POA: Insufficient documentation

## 2018-12-03 DIAGNOSIS — Z20828 Contact with and (suspected) exposure to other viral communicable diseases: Secondary | ICD-10-CM | POA: Diagnosis not present

## 2018-12-05 LAB — NOVEL CORONAVIRUS, NAA (HOSP ORDER, SEND-OUT TO REF LAB; TAT 18-24 HRS): SARS-CoV-2, NAA: NOT DETECTED

## 2018-12-07 ENCOUNTER — Ambulatory Visit: Payer: Medicare Other | Admitting: Emergency Medicine

## 2019-03-12 ENCOUNTER — Emergency Department (HOSPITAL_COMMUNITY): Payer: No Typology Code available for payment source

## 2019-03-12 ENCOUNTER — Other Ambulatory Visit: Payer: Self-pay

## 2019-03-12 ENCOUNTER — Observation Stay (HOSPITAL_COMMUNITY)
Admission: EM | Admit: 2019-03-12 | Discharge: 2019-03-13 | Disposition: A | Discharge status: Home / Self Care | Payer: No Typology Code available for payment source | Attending: Internal Medicine | Admitting: Internal Medicine

## 2019-03-12 ENCOUNTER — Encounter (HOSPITAL_COMMUNITY): Payer: Self-pay | Admitting: Emergency Medicine

## 2019-03-12 DIAGNOSIS — I2510 Atherosclerotic heart disease of native coronary artery without angina pectoris: Secondary | ICD-10-CM | POA: Diagnosis not present

## 2019-03-12 DIAGNOSIS — K219 Gastro-esophageal reflux disease without esophagitis: Secondary | ICD-10-CM | POA: Diagnosis not present

## 2019-03-12 DIAGNOSIS — Z79899 Other long term (current) drug therapy: Secondary | ICD-10-CM | POA: Diagnosis not present

## 2019-03-12 DIAGNOSIS — E785 Hyperlipidemia, unspecified: Secondary | ICD-10-CM | POA: Insufficient documentation

## 2019-03-12 DIAGNOSIS — Z86711 Personal history of pulmonary embolism: Secondary | ICD-10-CM | POA: Diagnosis not present

## 2019-03-12 DIAGNOSIS — I252 Old myocardial infarction: Secondary | ICD-10-CM | POA: Insufficient documentation

## 2019-03-12 DIAGNOSIS — I482 Chronic atrial fibrillation, unspecified: Secondary | ICD-10-CM | POA: Insufficient documentation

## 2019-03-12 DIAGNOSIS — E7800 Pure hypercholesterolemia, unspecified: Secondary | ICD-10-CM | POA: Diagnosis not present

## 2019-03-12 DIAGNOSIS — E039 Hypothyroidism, unspecified: Secondary | ICD-10-CM | POA: Diagnosis not present

## 2019-03-12 DIAGNOSIS — Z7901 Long term (current) use of anticoagulants: Secondary | ICD-10-CM | POA: Diagnosis not present

## 2019-03-12 DIAGNOSIS — I472 Ventricular tachycardia: Principal | ICD-10-CM | POA: Insufficient documentation

## 2019-03-12 DIAGNOSIS — Z20822 Contact with and (suspected) exposure to covid-19: Secondary | ICD-10-CM | POA: Insufficient documentation

## 2019-03-12 DIAGNOSIS — R079 Chest pain, unspecified: Secondary | ICD-10-CM | POA: Diagnosis present

## 2019-03-12 DIAGNOSIS — J449 Chronic obstructive pulmonary disease, unspecified: Secondary | ICD-10-CM | POA: Diagnosis not present

## 2019-03-12 DIAGNOSIS — Z950 Presence of cardiac pacemaker: Secondary | ICD-10-CM | POA: Diagnosis not present

## 2019-03-12 DIAGNOSIS — Z955 Presence of coronary angioplasty implant and graft: Secondary | ICD-10-CM | POA: Diagnosis not present

## 2019-03-12 DIAGNOSIS — Z9981 Dependence on supplemental oxygen: Secondary | ICD-10-CM | POA: Insufficient documentation

## 2019-03-12 DIAGNOSIS — Z79890 Hormone replacement therapy (postmenopausal): Secondary | ICD-10-CM | POA: Diagnosis not present

## 2019-03-12 DIAGNOSIS — N189 Chronic kidney disease, unspecified: Secondary | ICD-10-CM | POA: Diagnosis not present

## 2019-03-12 DIAGNOSIS — I5022 Chronic systolic (congestive) heart failure: Secondary | ICD-10-CM | POA: Diagnosis not present

## 2019-03-12 DIAGNOSIS — I499 Cardiac arrhythmia, unspecified: Secondary | ICD-10-CM | POA: Diagnosis present

## 2019-03-12 DIAGNOSIS — Z7951 Long term (current) use of inhaled steroids: Secondary | ICD-10-CM | POA: Insufficient documentation

## 2019-03-12 DIAGNOSIS — Z8249 Family history of ischemic heart disease and other diseases of the circulatory system: Secondary | ICD-10-CM | POA: Insufficient documentation

## 2019-03-12 DIAGNOSIS — I4891 Unspecified atrial fibrillation: Secondary | ICD-10-CM

## 2019-03-12 DIAGNOSIS — G4733 Obstructive sleep apnea (adult) (pediatric): Secondary | ICD-10-CM | POA: Insufficient documentation

## 2019-03-12 DIAGNOSIS — F17210 Nicotine dependence, cigarettes, uncomplicated: Secondary | ICD-10-CM | POA: Diagnosis not present

## 2019-03-12 LAB — CBC WITH DIFFERENTIAL/PLATELET
Abs Immature Granulocytes: 0.02 10*3/uL (ref 0.00–0.07)
Basophils Absolute: 0 10*3/uL (ref 0.0–0.1)
Basophils Relative: 0 %
Eosinophils Absolute: 0.1 10*3/uL (ref 0.0–0.5)
Eosinophils Relative: 1 %
HCT: 50.2 % (ref 39.0–52.0)
Hemoglobin: 16.8 g/dL (ref 13.0–17.0)
Immature Granulocytes: 0 %
Lymphocytes Relative: 27 %
Lymphs Abs: 2.1 10*3/uL (ref 0.7–4.0)
MCH: 31.1 pg (ref 26.0–34.0)
MCHC: 33.5 g/dL (ref 30.0–36.0)
MCV: 93 fL (ref 80.0–100.0)
Monocytes Absolute: 0.6 10*3/uL (ref 0.1–1.0)
Monocytes Relative: 7 %
Neutro Abs: 4.9 10*3/uL (ref 1.7–7.7)
Neutrophils Relative %: 65 %
Platelets: 137 10*3/uL — ABNORMAL LOW (ref 150–400)
RBC: 5.4 MIL/uL (ref 4.22–5.81)
RDW: 14.6 % (ref 11.5–15.5)
WBC: 7.7 10*3/uL (ref 4.0–10.5)
nRBC: 0 % (ref 0.0–0.2)

## 2019-03-12 LAB — HEPATIC FUNCTION PANEL
ALT: 17 U/L (ref 0–44)
AST: 28 U/L (ref 15–41)
Albumin: 3.4 g/dL — ABNORMAL LOW (ref 3.5–5.0)
Alkaline Phosphatase: 55 U/L (ref 38–126)
Bilirubin, Direct: 0.2 mg/dL (ref 0.0–0.2)
Indirect Bilirubin: 0.5 mg/dL (ref 0.3–0.9)
Total Bilirubin: 0.7 mg/dL (ref 0.3–1.2)
Total Protein: 6.4 g/dL — ABNORMAL LOW (ref 6.5–8.1)

## 2019-03-12 LAB — BASIC METABOLIC PANEL
Anion gap: 12 (ref 5–15)
BUN: 8 mg/dL (ref 8–23)
CO2: 25 mmol/L (ref 22–32)
Calcium: 8.7 mg/dL — ABNORMAL LOW (ref 8.9–10.3)
Chloride: 99 mmol/L (ref 98–111)
Creatinine, Ser: 1.2 mg/dL (ref 0.61–1.24)
GFR calc Af Amer: >60 mL/min (ref >60)
GFR calc non Af Amer: >60 mL/min (ref >60)
Glucose, Bld: 164 mg/dL — ABNORMAL HIGH (ref 70–99)
Potassium: 3.4 mmol/L — ABNORMAL LOW (ref 3.5–5.1)
Sodium: 136 mmol/L (ref 135–145)

## 2019-03-12 LAB — TROPONIN I (HIGH SENSITIVITY)
Troponin I (High Sensitivity): 14 ng/L (ref <18)
Troponin I (High Sensitivity): 43 ng/L — ABNORMAL HIGH (ref <18)

## 2019-03-12 LAB — MAGNESIUM: Magnesium: 1.9 mg/dL (ref 1.7–2.4)

## 2019-03-12 MED ORDER — PANTOPRAZOLE SODIUM 40 MG PO TBEC
80.0000 mg | DELAYED_RELEASE_TABLET | Freq: Every day | ORAL | Status: DC
  Administered 2019-03-13: 08:00:00 80 mg via ORAL
  Filled 2019-03-12: qty 2

## 2019-03-12 MED ORDER — MONTELUKAST SODIUM 10 MG PO TABS
10.0000 mg | ORAL_TABLET | Freq: Every day | ORAL | Status: DC
  Administered 2019-03-12: 10 mg via ORAL
  Filled 2019-03-12: qty 1

## 2019-03-12 MED ORDER — LEVOTHYROXINE SODIUM 75 MCG PO TABS
75.0000 ug | ORAL_TABLET | Freq: Every day | ORAL | Status: DC
  Administered 2019-03-13: 75 ug via ORAL
  Filled 2019-03-12: qty 1

## 2019-03-12 MED ORDER — ROSUVASTATIN CALCIUM 20 MG PO TABS
20.0000 mg | ORAL_TABLET | Freq: Every day | ORAL | Status: DC
  Administered 2019-03-12: 20 mg via ORAL
  Filled 2019-03-12: qty 1

## 2019-03-12 MED ORDER — CARBOXYMETHYLCELLULOSE SODIUM 0.5 % OP SOLN: 1.0000 [drp] | Freq: Every day | OPHTHALMIC | Status: DC

## 2019-03-12 MED ORDER — APIXABAN 5 MG PO TABS
5.0000 mg | ORAL_TABLET | Freq: Two times a day (BID) | ORAL | Status: DC
  Administered 2019-03-12 – 2019-03-13 (×2): 5 mg via ORAL
  Filled 2019-03-12 (×2): qty 1

## 2019-03-12 MED ORDER — OMEGA-3-ACID ETHYL ESTERS 1 G PO CAPS: 1.0000 g | ORAL_CAPSULE | Freq: Two times a day (BID) | ORAL | Status: DC

## 2019-03-12 MED ORDER — VITAMIN D 25 MCG (1000 UNIT) PO TABS
2000.0000 [IU] | ORAL_TABLET | Freq: Every day | ORAL | Status: DC
  Administered 2019-03-13: 2000 [IU] via ORAL
  Filled 2019-03-12: qty 2

## 2019-03-12 MED ORDER — BUDESONIDE 0.25 MG/2ML IN SUSP
0.2500 mg | Freq: Two times a day (BID) | RESPIRATORY_TRACT | Status: DC
  Administered 2019-03-13: 0.25 mg via RESPIRATORY_TRACT
  Filled 2019-03-12 (×2): qty 2

## 2019-03-12 MED ORDER — PROPYLENE GLYCOL 0.6 % OP SOLN: 1.0000 [drp] | OPHTHALMIC | Status: DC

## 2019-03-12 MED ORDER — VITAMIN D3 50 MCG (2000 UT) PO TABS: 2000.0000 [IU] | ORAL_TABLET | Freq: Every day | ORAL | Status: DC

## 2019-03-12 MED ORDER — ARFORMOTEROL TARTRATE 15 MCG/2ML IN NEBU
15.0000 ug | INHALATION_SOLUTION | Freq: Two times a day (BID) | RESPIRATORY_TRACT | Status: DC
  Administered 2019-03-13: 15 ug via RESPIRATORY_TRACT
  Filled 2019-03-12 (×2): qty 2

## 2019-03-12 MED ORDER — FUROSEMIDE 40 MG PO TABS
40.0000 mg | ORAL_TABLET | Freq: Every day | ORAL | Status: DC
  Administered 2019-03-13: 40 mg via ORAL
  Filled 2019-03-12: qty 1

## 2019-03-12 MED ORDER — SODIUM CHLORIDE 0.9 % IV SOLN: 250.0000 mL | INTRAVENOUS | Status: DC | PRN

## 2019-03-12 MED ORDER — POTASSIUM CHLORIDE CRYS ER 20 MEQ PO TBCR
40.0000 meq | EXTENDED_RELEASE_TABLET | Freq: Once | ORAL | Status: AC
  Administered 2019-03-12: 40 meq via ORAL
  Filled 2019-03-12: qty 2

## 2019-03-12 MED ORDER — LOSARTAN POTASSIUM 25 MG PO TABS
12.5000 mg | ORAL_TABLET | Freq: Every day | ORAL | Status: DC
  Administered 2019-03-13: 12.5 mg via ORAL
  Filled 2019-03-12: qty 1

## 2019-03-12 MED ORDER — ADULT MULTIVITAMIN W/MINERALS CH
1.0000 | ORAL_TABLET | Freq: Every day | ORAL | Status: DC
  Administered 2019-03-13: 1 via ORAL
  Filled 2019-03-12: qty 1

## 2019-03-12 MED ORDER — ONDANSETRON HCL 4 MG/2ML IJ SOLN: 4.0000 mg | Freq: Four times a day (QID) | INTRAMUSCULAR | Status: DC | PRN

## 2019-03-12 MED ORDER — SODIUM CHLORIDE 0.9% FLUSH
3.0000 mL | Freq: Two times a day (BID) | INTRAVENOUS | Status: DC
  Administered 2019-03-13: 3 mL via INTRAVENOUS

## 2019-03-12 MED ORDER — ALBUTEROL SULFATE (2.5 MG/3ML) 0.083% IN NEBU: 2.5000 mg | INHALATION_SOLUTION | Freq: Four times a day (QID) | RESPIRATORY_TRACT | Status: DC | PRN

## 2019-03-12 MED ORDER — ACETAMINOPHEN 325 MG PO TABS: 650.0000 mg | ORAL_TABLET | ORAL | Status: DC | PRN

## 2019-03-12 MED ORDER — METOPROLOL SUCCINATE ER 50 MG PO TB24
75.0000 mg | ORAL_TABLET | Freq: Two times a day (BID) | ORAL | Status: DC
  Administered 2019-03-12 – 2019-03-13 (×2): 75 mg via ORAL
  Filled 2019-03-12 (×2): qty 1

## 2019-03-12 MED ORDER — MAGNESIUM SULFATE 2 GM/50ML IV SOLN: 2.0000 g | Freq: Once | INTRAVENOUS | Status: DC

## 2019-03-12 MED ORDER — UMECLIDINIUM BROMIDE 62.5 MCG/INH IN AEPB
1.0000 | INHALATION_SPRAY | Freq: Every day | RESPIRATORY_TRACT | Status: DC
  Administered 2019-03-13: 1 via RESPIRATORY_TRACT
  Filled 2019-03-12: qty 7

## 2019-03-12 MED ORDER — POLYVINYL ALCOHOL 1.4 % OP SOLN
1.0000 [drp] | OPHTHALMIC | Status: DC | PRN
  Filled 2019-03-12: qty 15

## 2019-03-12 MED ORDER — SODIUM CHLORIDE 0.9% FLUSH: 3.0000 mL | INTRAVENOUS | Status: DC | PRN

## 2019-03-12 MED ORDER — SPIRONOLACTONE 25 MG PO TABS
25.0000 mg | ORAL_TABLET | Freq: Every day | ORAL | Status: DC
  Administered 2019-03-13: 08:00:00 25 mg via ORAL
  Filled 2019-03-12: qty 1

## 2019-03-12 NOTE — H&P (Signed)
Cardiology Admission History and Physical:   Patient ID: Max Rivas MRN: [id number]; DOB: 14-Feb-1950   Admission date: 03/12/2019  Primary Care Provider: [name], MD Primary Cardiologist: No primary care provider on file.  Primary Electrophysiologist:  Dr. Crissie Sickles  Chief Complaint:  ICD discharge  Patient Profile:   Max Rivas is a 69 y.o. male with HFrEF (EF 20%), iCM, CAD s/p multiple PCI, chronic AF, CKD presents with ICD discharge.   History of Present Illness:   Max Rivas was at home earlier today playing cards. He was feeling normal and had no complaints all day. He states he felt sudden onset dizziness, lightheadedness, palpitations. He had never felt symptoms like this before. His symptoms persisted for several minutes and he began to feel as though he was going to pass out, though he never did actually pass out. He asked someone to get his blood pressure machine and he attempted to take his blood pressure, at which time his ICD fired 1 time. He states that immediately after ICD discharge he went back to feeling normal again. Since this time he has felt normal and has no complaints.   The patient notably has had an ICD for >15 years. He has had 1 episode shortly after implantation where his device fired multiple times. This was caused by lead malfunction for which he underwent lead revision. He has not had any ICD discharges since that time.   The patient states he has been taking all of his medications as prescribed. He has not missed any doses lately and there have been no changes to his medications. He was previously on amiodarone for AF but this was stopped quite a while ago. Other than during the episode earlier today, he denies having any recent fevers, chills, chest pain, chest pressure, shortness of breath, nausea, vomiting, leg swelling, orthopnea, PND.                                                 Prior to the patient's presentation, he denies any  alcohol or illicit substance intake. He does state he drank an excessive amount of coffee today.                                                                         Heart Pathway Score:     Past Medical History:  Diagnosis Date  . Arthritis    "elbows, knees" (02/24/2016)  . Bronchitis 2006  . CHF (congestive heart failure) ([place])   . COPD (chronic obstructive pulmonary disease) ([place])   . Coronary artery disease   . GERD (gastroesophageal reflux disease)   . High cholesterol   . Myocardial infarction [facility]) ~ 1995 X 2;1998; 2000; 2004  . On home oxygen therapy    "2L w/activity" (02/24/2016)  . Pacemaker   . Pulmonary embolism ([place]) 02/24/2016  . Sleep apnea    "have CPAP; can't tolerate it" (02/24/2016)    Past Surgical History:  Procedure Laterality Date  . CORONARY ANGIOPLASTY  1995  . CORONARY ANGIOPLASTY WITH STENT PLACEMENT  ~ 1995 - 2004 X  5   "I've got a total of 5 stents" (02/24/2016)  . 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 PACEMAKER GENERATOR CHANGE  03/2011    [facility] [place]  . TONSILLECTOMY       Medications Prior to Admission: Prior to Admission medications   Medication Sig Start Date End Date Taking? Authorizing Provider  acetaminophen (TYLENOL) 325 MG tablet Take 2 tablets (650 mg total) by mouth every 6 (six) hours as needed for mild pain (or Fever >/= 101). 03/05/16   Alison Murray, MD  amiodarone (PACERONE) 200 MG tablet Take 200 mg by mouth daily.    [provider]  apixaban (ELIQUIS) 5 MG TABS tablet Take 5 mg by mouth 2 (two) times daily.    [provider]  carboxymethylcellulose (REFRESH PLUS) 0.5 % SOLN Place 1 drop into both eyes at bedtime.    [provider]  furosemide (LASIX) 40 MG tablet Take 1 tablet (40 mg total) by mouth daily. If lower extremity edema take 1 additional tablet 06/23/18 06/23/19  Black, Lesle Chris, NP  Levothyroxine Sodium (LEVOTHROID PO) Take 1 tablet  by mouth daily.     [provider]  losartan (COZAAR) 25 MG tablet Take 12.5 mg by mouth daily.    [provider]  metoprolol succinate (TOPROL-XL) 50 MG 24 hr tablet Take 75 mg by mouth 2 (two) times daily. Take with or immediately following a meal.    [provider]  mometasone (ASMANEX) 220 MCG/INH inhaler Inhale 2 puffs into the lungs 2 (two) times daily as needed (SOB).     [provider]  Multiple Vitamin (MULTIVITAMIN WITH MINERALS) TABS tablet Take 1 tablet by mouth daily.    [provider]  nitroGLYCERIN (NITROSTAT) 0.4 MG SL tablet Place 0.4 mg under the tongue every 5 (five) minutes x 3 doses as needed for chest pain.    [provider]  omega-3 acid ethyl esters (LOVAZA) 1 g capsule Take 1 capsule (1 g total) by mouth 2 (two) times daily. 03/05/16   Alison Murray, MD  Polyethyl Glycol-Propyl Glycol 0.4-0.3 % SOLN Place 1 drop into both eyes 3 (three) times daily. (SYSTANE DROPS)    [provider]  PRESCRIPTION MEDICATION Inhale 2 puffs into the lungs every 6 (six) hours as needed (wheezing/shortness of breath). (LEVALBUTEROL INHALER)    [provider]  rosuvastatin (CRESTOR) 40 MG tablet Take 40 mg by mouth daily.    [provider]  spironolactone (ALDACTONE) 25 MG tablet Take 1 tablet (25 mg total) by mouth daily. 03/06/16   Graciella Freer, PA-C  Tiotropium Bromide-Olodaterol (STIOLTO RESPIMAT) 2.5-2.5 MCG/ACT AERS Inhale 2 puffs into the lungs 2 (two) times daily.     [provider]     Allergies:    Allergies  Allergen Reactions  . Crestor [Rosuvastatin Calcium] Other (See Comments)    Back spasms, but can tolerate it at a low dose    Social History:   Social History   Socioeconomic History  . Marital status: Divorced    Spouse name: Not on file  . Number of children: Not on file  . Years of education: Not on file  . Highest education level: Not on file  Occupational  History  . Not on file  Tobacco Use  . Smoking status: Current Some Day Smoker    Packs/day: 3.00    Years: 35.00  Pack years: 105.00    Types: Cigarettes  . Smokeless tobacco: Never Used  . Tobacco comment: 02/24/2016 "stopped ~ 8-9 months ago; have had 5 cigarettes in the last week"  Substance and Sexual Activity  . Alcohol use: No    Comment: 02/24/2016 "none in a long time"  . Drug use: Yes    Types: Cocaine    Comment: 02/24/2016 "none since 1995"  . Sexual activity: Yes  Other Topics Concern  . Not on file  Social History Narrative  . Not on file   Social Determinants of Health   Financial Resource Strain:   . Difficulty of Paying Living Expenses: Not on file  Food Insecurity:   . Worried About Programme researcher, broadcasting/film/video in the Last Year: Not on file  . Ran Out of Food in the Last Year: Not on file  Transportation Needs:   . Lack of Transportation (Medical): Not on file  . Lack of Transportation (Non-Medical): Not on file  Physical Activity:   . Days of Exercise per Week: Not on file  . Minutes of Exercise per Session: Not on file  Stress:   . Feeling of Stress : Not on file  Social Connections:   . Frequency of Communication with Friends and Family: Not on file  . Frequency of Social Gatherings with Friends and Family: Not on file  . Attends Religious Services: Not on file  . Active Member of Clubs or Organizations: Not on file  . Attends Banker Meetings: Not on file  . Marital Status: Not on file  Intimate Partner Violence:   . Fear of Current or Ex-Partner: Not on file  . Emotionally Abused: Not on file  . Physically Abused: Not on file  . Sexually Abused: Not on file    Family History:   The patient's family history includes Heart attack in his father and mother; Heart attack (age of onset: 71) in his sister.    ROS:  Please see the history of present illness.  All other ROS reviewed and negative.     Physical Exam/Data:   Vitals:   03/12/19  1545 03/12/19 1645 03/12/19 1700 03/12/19 1715  BP: (!) 115/52 118/64 111/81 (!) 121/57  Pulse: 94 83 83 75  Resp: 18 (!) 23 (!) 23 17  Temp:      TempSrc:      SpO2: 92% 94% 91% 91%  Weight:      Height:       No intake or output data in the 24 hours ending 03/12/19 1720 Last 3 Weights 03/12/2019 07/26/2018 06/23/2018  Weight (lbs) 237 lb 250 lb 245 lb 2.4 oz  Weight (kg) 107.502 kg 113.399 kg 111.2 kg     Body mass index is 33.05 kg/m.  General:  Well nourished, well developed, in no acute distress HEENT: normal Lymph: no adenopathy Neck: no JVD Endocrine:  No thryomegaly Vascular: No carotid bruits; FA pulses 2+ bilaterally without bruits  Cardiac:  normal S1, S2; irregularly irregular rhythm; no murmur  Lungs:  Faint end expiratory wheezing heard in the bilateral lung fields Abd: soft, nontender, no hepatomegaly  Ext: 1+ pitting bilateral ankle edema Musculoskeletal:  No deformities, BUE and BLE strength normal and equal Skin: warm and dry  Neuro:  CNs 2-12 intact, no focal abnormalities noted Psych:  Normal affect    EKG:  The ECG that was done on ED arrival was personally reviewed and demonstrates AF, HR 96, borderline T wave abnormalities  Relevant  CV Studies: Echo 06/22/18 1. Suboptimal acoustic windows significantly degrade diagnostic capacity  of study.  2. The left ventricle has moderate-severely reduced systolic function,  with an ejection fraction of 30-35%. The cavity size was normal. Left  ventricular diastolic Doppler parameters are consistent with  pseudonormalization. Elevated left ventricular  end-diastolic pressure The E/e' is 16.  3. The inferior vena cava was dilated in size with >50% respiratory  variability.   Laboratory Data:  High Sensitivity Troponin:   Recent Labs  Lab 03/12/19 1551  TROPONINIHS 14      Chemistry Recent Labs  Lab 03/12/19 1551  NA 136  K 3.4*  CL 99  CO2 25  GLUCOSE 164*  BUN 8  CREATININE 1.20  CALCIUM 8.7*   GFRNONAA >60  GFRAA >60  ANIONGAP 12    No results for input(s): PROT, ALBUMIN, AST, ALT, ALKPHOS, BILITOT in the last 168 hours. Hematology Recent Labs  Lab 03/12/19 1551  WBC 7.7  RBC 5.40  HGB 16.8  HCT 50.2  MCV 93.0  MCH 31.1  MCHC 33.5  RDW 14.6  PLT 137*   BNPNo results for input(s): BNP, PROBNP in the last 168 hours.  DDimer No results for input(s): DDIMER in the last 168 hours.   Radiology/Studies:  DG Chest Port 1 View  Result Date: 03/12/2019 CLINICAL DATA:  Chest pain EXAM: PORTABLE CHEST 1 VIEW COMPARISON:  06/21/2018 FINDINGS: Cardiomegaly with left chest multi lead pacer defibrillator. Small, chronic, calcified right pleural effusion. The visualized skeletal structures are unremarkable. IMPRESSION: 1. Cardiomegaly without acute abnormality of the lungs in AP portable projection. 2.  Small, chronic, calcified right pleural effusion. Electronically Signed   By: Lauralyn Primes M.D.   On: 03/12/2019 16:24    Assessment and Plan:  Max Rivas is a 69 y.o. male with HFrEF (EF 20%), iCM, CAD s/p multiple PCI, chronic AF, CKD presents with ICD discharge.   I personally reviewed the interrogation of the patient's ICD along with the [employer] rep at the bedside. The patient had 1 event at 3:45PM that was labeled by the device as SVT. This lasted 3 minutes and 45 seconds with an average HR of 207bpm that kept going in and out of the VF zone. This was slightly irregular with cycle lengths ranging between 270 and . After the timeout period, the device attempted ATP and the patient's rhythm accelerated subsequently becoming regular with a consistent cycle length of 270 to (consistently within the VF zone). The patient was shocked for this rhythm and had no other arrhythmia before or after this.   It is difficult to interpret this electrogram without an atrial lead tracing, however I believe that he had an episode of very rapid AF (perhaps precipitated by excessive  caffeine intake today) then when his device timed out and he was ATP'd, he developed VF for which he was shocked. It is possible that the initial rhythm was VF, however it seems highly unlikely that the patient would have been able to tolerate 3+ minutes of extremely fast VT/VF without syncope (and the rhythm was notable regular). I recommended that we keep the patient tonight under observation and have the EP team see him in the AM. He is agreeable to this plan. It seems as though amiodarone loading would be a very reasonable option, however I will not start this tonight and defer to the EP team in the AM. If he experiences recurrent rapid AF or VT tonight we will start amiodarone.  Lastly, the patient does not have any evidence of decompensated heart failure on exam, has not missed any meds, and has no symptoms suggestive of active ischemia. His initial troponin is normal. These suggest against an alternative etiology of his arrhythmia.   #) ICD discharge - check LFTs and TFT's in anticipation of amiodarone restart - K and M supplementation provided - follow up 2nd troponin - cont home metoprolol succinate 75mg  BID - BP on the lower side, though may be reasonable to try higher dose of beta blocker depending on what the AAD plan is - EP to see in AM  #) AF - cont home apixaban - metoprolol and AAD as per above  #) HFrEF, iCM - cont home dose lasix, crestor, metoprolol, spironolactone, losartan  #) Chronic lung disease - cont chronic lung medications  Severity of Illness: The appropriate patient status for this patient is OBSERVATION. Observation status is judged to be reasonable and necessary in order to provide the required intensity of service to ensure the patient's safety. The patient's presenting symptoms, physical exam findings, and initial radiographic and laboratory data in the context of their medical condition is felt to place them at decreased risk for further clinical  deterioration. Furthermore, it is anticipated that the patient will be medically stable for discharge from the hospital within 2 midnights of admission. The following factors support the patient status of observation.   " The patient's presenting symptoms include ICD shock. " The physical exam findings include euvolemic. " The initial radiographic and laboratory data are device interrogation as per above.     For questions or updates, please contact [facility] Please consult [email or web address] for contact info under        Signed, , MD  03/12/2019 5:20 PM

## 2019-03-12 NOTE — ED Notes (Signed)
RN attempted report 

## 2019-03-12 NOTE — ED Notes (Signed)
Lab called RN for increase in troponin, 43

## 2019-03-12 NOTE — ED Triage Notes (Addendum)
Pt here from home via [facility] for sudden cp w/ pressure radiating to L arm, SOB, lightheadedness, and diaphoresis. Pt has pacemaker/defib, defib went off and pt states he feels a lot better, denies cp currently. Hx of COPD, emphysema, bronchitis, 4 MI (last one in 2005), afib. Pt is on eliquis and received 2nd covid vaccine 02/21/19. AOx4, VSS.

## 2019-03-12 NOTE — ED Notes (Signed)
[employer] report faxed over and given to Dr. Fredderick Phenix.

## 2019-03-12 NOTE — ED Provider Notes (Signed)
[name] EMERGENCY DEPARTMENT Provider Note   CSN: [id number] Arrival date & time: 03/12/19  1535     History Chief Complaint  Patient presents with  . Chest Pain    Max Rivas is a 69 y.o. male.  Patient is a 69 year old male with a history of CHF status post pacemaker/AICD placement, coronary artery disease, COPD, atrial fibrillation and chronic kidney disease.  He is on Eliquis.  He states that about an hour ago he was sitting at the table playing cards and had onset of tightness in the left side of his chest going down his left arm.  He felt a little lightheaded and felt like his heart was racing.  He got a little short of breath.  EMS was called.  Shortly after he felt his defibrillator go off.  He said following that his symptoms resolved and he feels back to baseline now.  He did not get any medications by EMS.  He has had no further episodes of chest pain.  He is otherwise been feeling well recently.  No increase shortness of breath or leg swelling.  No recent fevers or cough.  His cardiologist is with the [facility] in [place].  Dr. Ladona Ridgel placed his defibrillator per the patient.        Past Medical History:  Diagnosis Date  . Arthritis    "elbows, knees" (02/24/2016)  . Bronchitis 2006  . CHF (congestive heart failure) (HCC)   . COPD (chronic obstructive pulmonary disease) (HCC)   . Coronary artery disease   . GERD (gastroesophageal reflux disease)   . High cholesterol   . Myocardial infarction [facility]) ~ 1995 X 2;1998; 2000; 2004  . On home oxygen therapy    "2L w/activity" (02/24/2016)  . Pacemaker   . Pulmonary embolism (HCC) 02/24/2016  . Sleep apnea    "have CPAP; can't tolerate it" (02/24/2016)    Patient Active Problem List   Diagnosis Date Noted  . Arrhythmia 03/12/2019  . On home O2   . Acute on chronic systolic CHF (congestive heart failure) (HCC) 06/22/2018  . Chronic respiratory failure with hypoxia (HCC) 06/22/2018  .  Congestive heart failure (CHF) (HCC) 06/22/2018  . DCM (dilated cardiomyopathy) (HCC) 05/25/2017  . Coronary artery disease involving native coronary artery of native heart without angina pectoris 05/25/2017  . Chronic atrial fibrillation 05/25/2017  . COPD (chronic obstructive pulmonary disease) (HCC) 05/25/2017  . CKD (chronic kidney disease) 05/25/2017  . Hypothyroidism 05/25/2017  . 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 OSA (obstructive sleep apnea) 05/25/2017  . Pulmonary HTN (HCC) 05/25/2017  . Acute on chronic respiratory failure with hypoxia (HCC)   . Mediastinal adenopathy   . Cervical adenopathy   . Acute respiratory failure with hypoxia (HCC) 02/24/2016  . Pulmonary embolus (HCC) 02/24/2016  . Pulmonary artery thrombosis (HCC)   . Pleural effusion   . Hypoxemia   . Lung mass   . Pleural plaque   . Flutter-fibrillation (HCC) 01/15/2016  . Acute systolic congestive heart failure (HCC)   . Atrial fibrillation with RVR (HCC)   . Hypercholesterolemia 03/11/2006  . TOBACCO DEPENDENCE 03/11/2006  . Acute on chronic systolic congestive heart failure (HCC) 03/11/2006  . GASTROESOPHAGEAL REFLUX, NO ESOPHAGITIS 03/11/2006  . OSTEOARTHRITIS, MULTI SITES 03/11/2006  . HIGH RISK PATIENT 03/11/2006  . Tobacco dependence 03/11/2006    Past Surgical History:  Procedure Laterality Date  . CORONARY ANGIOPLASTY  1995  . CORONARY ANGIOPLASTY WITH STENT PLACEMENT  ~  1995 - 2004 X 5   "I've got a total of 5 stents" (02/24/2016)  . 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 PACEMAKER GENER[facility] CH[facility]  [facility] [place]  . TONSILLECTOMY         Family History  Problem Relation Age of Onset  . Heart attack Mother   . Heart attack Father   . Heart attack Sister 61    Social History   Tobacco Use  . Smoking status: Current Some Day Smoker    Packs/day: 3.00    Years: 35.00    Pack years:  105.00    Types: Cigarettes  . Smokeless tobacco: Never Used  . Tobacco comment: 02/24/2016 "stopped ~ 8-9 months ago; have had 5 cigarettes in the last week"  Substance Use Topics  . Alcohol use: No    Comment: 02/24/2016 "none in a long time"  . Drug use: Yes    Types: Cocaine    Comment: 02/24/2016 "none since 1995"    Home Medications Prior to Admission medications   Medication Sig Start Date End Date Taking? Authorizing Provider  acetaminophen (TYLENOL) 325 MG tablet Take 2 tablets (650 mg total) by mouth every 6 (six) hours as needed for mild pain (or Fever >/= 101). 03/05/16  Yes Alison Murray, MD  apixaban (ELIQUIS) 5 MG TABS tablet Take 5 mg by mouth 2 (two) times daily.   Yes [provider]  carboxymethylcellulose (REFRESH PLUS) 0.5 % SOLN Place 1 drop into both eyes at bedtime.   Yes [provider]  Cholecalciferol (VITAMIN D3) 50 MCG (2000 UT) TABS Take 2,000 Units by mouth daily.   Yes [provider]  furosemide (LASIX) 40 MG tablet Take 1 tablet (40 mg total) by mouth daily. If lower extremity edema take 1 additional tablet Patient taking differently: Take 40 mg by mouth See admin instructions. Take 40 mg by mouth once a day and an additional 40 mg as needed for extremity edema 06/23/18 06/23/19 Yes Black, Lesle Chris, NP  levalbuterol [facility] HFA) 45 MCG/ACT inhaler Inhale 2 puffs into the lungs every 6 (six) hours as needed for wheezing or shortness of breath.   Yes [provider]  levothyroxine (SYNTHROID) 75 MCG tablet Take 75 mcg by mouth daily before breakfast.   Yes [provider]  losartan (COZAAR) 25 MG tablet Take 12.5 mg by mouth daily.   Yes [provider]  metoprolol succinate (TOPROL-XL) 50 MG 24 hr tablet Take 75 mg by mouth 2 (two) times daily. Take with or immediately following a meal.   Yes [provider]  mometasone (ASMANEX) 220 MCG/INH inhaler Inhale 2 puffs into the lungs 2 (two) times daily.     Yes [provider]  montelukast (SINGULAIR) 10 MG tablet Take 10 mg by mouth at bedtime.   Yes [provider]  Multiple Vitamin (MULTIVITAMIN WITH MINERALS) TABS tablet Take 1 tablet by mouth daily.   Yes [provider]  Omega-3 Fatty Acids (FISH OIL) 1000 MG CAPS Take 1,000 mg by mouth in the morning and at bedtime.   Yes [provider]  omeprazole (PRILOSEC) 20 MG capsule Take 20 mg by mouth 2 (two) times daily before a meal.   Yes [provider]  Propylene Glycol (SYSTANE BALANCE) 0.6 % SOLN Place 1 drop into both eyes See admin instructions. Place 1 drop into each eye four  times a day and apply a warm compress for 10 minutes afterwards   Yes [provider]  rosuvastatin (CRESTOR) 40 MG tablet Take 20 mg by mouth at bedtime.    Yes [provider]  spironolactone (ALDACTONE) 25 MG tablet Take 1 tablet (25 mg total) by mouth daily. 03/06/16  Yes Shirley Friar, PA-C  Tiotropium Bromide-Olodaterol (STIOLTO RESPIMAT) 2.5-2.5 MCG/ACT AERS Inhale 2 puffs into the lungs daily.    Yes [provider]  nitroGLYCERIN (NITROSTAT) 0.4 MG SL tablet Place 0.4 mg under the tongue every 5 (five) minutes x 3 doses as needed for chest pain.    [provider]  omega-3 acid ethyl esters (LOVAZA) 1 g capsule Take 1 capsule (1 g total) by mouth 2 (two) times daily. Patient not taking: Reported on 03/12/2019 03/05/16   Robbie Lis, MD    Allergies    Crestor [rosuvastatin calcium]  Review of Systems   Review of Systems  Constitutional: Negative for chills, diaphoresis, fatigue and fever.  HENT: Negative for congestion, rhinorrhea and sneezing.   Eyes: Negative.   Respiratory: Positive for chest tightness and shortness of breath. Negative for cough.   Cardiovascular: Positive for chest pain and palpitations. Negative for leg swelling.  Gastrointestinal: Negative for abdominal pain, blood in stool, diarrhea, nausea  and vomiting.  Genitourinary: Negative for difficulty urinating, flank pain, frequency and hematuria.  Musculoskeletal: Negative for arthralgias and back pain.  Skin: Negative for rash.  Neurological: Positive for light-headedness. Negative for speech difficulty, weakness, numbness and headaches.    Physical Exam Updated Vital Signs BP 110/77   Pulse 71   Temp 98.3 F (36.8 C) (Oral)   Resp 19   Ht 5\' 11"  (1.803 m)   Wt 107.5 kg   SpO2 93%   BMI 33.05 kg/m   Physical Exam Constitutional:      Appearance: He is well-developed.  HENT:     Head: Normocephalic and atraumatic.  Eyes:     Pupils: Pupils are equal, round, and reactive to light.  Cardiovascular:     Rate and Rhythm: Normal rate and regular rhythm.     Heart sounds: Normal heart sounds.  Pulmonary:     Effort: Pulmonary effort is normal. No respiratory distress.     Breath sounds: Normal breath sounds. No wheezing or rales.  Chest:     Chest wall: No tenderness.  Abdominal:     General: Bowel sounds are normal.     Palpations: Abdomen is soft.     Tenderness: There is no abdominal tenderness. There is no guarding or rebound.  Musculoskeletal:        General: Normal range of motion.     Cervical back: Normal range of motion and neck supple.     Comments: 1+ edema to lower extremities bilaterally  Lymphadenopathy:     Cervical: No cervical adenopathy.  Skin:    General: Skin is warm and dry.     Findings: No rash.  Neurological:     Mental Status: He is alert and oriented to person, place, and time.     ED Results / Procedures / Treatments   Labs (all labs ordered are listed, but only abnormal results are displayed) Labs Reviewed  BASIC METABOLIC PANEL - Abnormal; Notable for the following components:      Result Value   Potassium 3.4 (*)    Glucose, Bld 164 (*)    Calcium 8.7 (*)    All other components within normal  limits  CBC WITH DIFFERENTIAL/PLATELET - Abnormal; Notable for the following  components:   Platelets 137 (*)    All other components within normal limits  MAGNESIUM  TROPONIN I (HIGH SENSITIVITY)  TROPONIN I (HIGH SENSITIVITY)    EKG EKG Interpretation  Date/Time:  Sunday March 12 2019 16:18:21 EST Ventricular Rate:  96 PR Interval:    QRS Duration: 139 QT Interval:  405 QTC Calculation: 456 R Axis:   16 Text Interpretation: Atrial fibrillation Nonspecific intraventricular conduction delay Borderline T abnormalities, lateral leads since last tracing no significant change Confirmed by Isabel Freese ([id number]) on 03/12/2019 4:20:54 PM   Radiology DG Chest Port 1 View  Result Date: 03/12/2019 CLINICAL DATA:  Chest pain EXAM: PORTABLE CHEST 1 VIEW COMPARISON:  06/21/2018 FINDINGS: Cardiomegaly with left chest multi lead pacer defibrillator. Small, chronic, calcified right pleural effusion. The visualized skeletal structures are unremarkable. IMPRESSION: 1. Cardiomegaly without acute abnormality of the lungs in AP portable projection. 2.  Small, chronic, calcified right pleural effusion. Electronically Signed   By: Alex  Bibbey M.D.   On: 03/12/2019 16:24    Procedures Procedures (including critical care time)  Medications Ordered in ED Medications  magnesium sulfate IVPB 2 g 50 mL (has no administration in time range)  potassium chloride SA (KLOR-CON) CR tablet 40 mEq (40 mEq Oral Given 03/12/19 1734)    ED Course  I have reviewed the triage vital signs and the nursing notes.  Pertinent labs & imaging results that were available during my care of the patient were reviewed by me and considered in my medical decision making (see chart for details).    MDM Rules/Calculators/A&P                      17 :00 [employer] rep called and said that pt had a 3 minute episode of VF prior to his defibrillation.  Feels that it needs to be reprogrammed.  Consulted with cardiology who will see the pt.  Patient was further evaluated by [employer] tach and cardiology  fellow who could not determine whether this was a ventricular or atrial tachyarrhythmia.  He will be admitted to cardiology for observation and reassessment by the electrophysiologist tomorrow.  His labs are nonconcerning. Final Clinical Impression(s) / ED Diagnoses Final diagnoses:  Cardiac arrhythmia, unspecified cardiac arrhythmia type    Rx / DC Orders ED Discharge Orders    None       , MD 03/12/19 [phone]

## 2019-03-12 NOTE — ED Notes (Signed)
Pt ambulated independently to bathroom.

## 2019-03-12 NOTE — ED Notes (Signed)
RN interrogated pt's [employer] pacemaker, verbal order from Dr. Fredderick Phenix.

## 2019-03-12 NOTE — ED Notes (Signed)
[employer] rep reported pt was in VF and received defib today, batter is nearing replacement, and fluid accumulation noted indicating possible CHF. Dr. Fredderick Phenix made aware, [employer] will fax report over.

## 2019-03-12 NOTE — Progress Notes (Signed)
RN has reviewed safety issues with pt, concerning fall risk.  Pt declines bed alarm during this time.  Pt is alert and oriented x 4.

## 2019-03-13 DIAGNOSIS — I472 Ventricular tachycardia: Secondary | ICD-10-CM

## 2019-03-13 DIAGNOSIS — Z4502 Encounter for adjustment and management of automatic implantable cardiac defibrillator: Secondary | ICD-10-CM

## 2019-03-13 LAB — BASIC METABOLIC PANEL
Anion gap: 13 (ref 5–15)
BUN: 10 mg/dL (ref 8–23)
CO2: 25 mmol/L (ref 22–32)
Calcium: 8.8 mg/dL — ABNORMAL LOW (ref 8.9–10.3)
Chloride: 101 mmol/L (ref 98–111)
Creatinine, Ser: 1.23 mg/dL (ref 0.61–1.24)
GFR calc Af Amer: >60 mL/min (ref >60)
GFR calc non Af Amer: >60 mL/min (ref >60)
Glucose, Bld: 95 mg/dL (ref 70–99)
Potassium: 3.9 mmol/L (ref 3.5–5.1)
Sodium: 139 mmol/L (ref 135–145)

## 2019-03-13 LAB — CBC
HCT: 48.9 % (ref 39.0–52.0)
Hemoglobin: 16.3 g/dL (ref 13.0–17.0)
MCH: 31.2 pg (ref 26.0–34.0)
MCHC: 33.3 g/dL (ref 30.0–36.0)
MCV: 93.5 fL (ref 80.0–100.0)
Platelets: 143 10*3/uL — ABNORMAL LOW (ref 150–400)
RBC: 5.23 MIL/uL (ref 4.22–5.81)
RDW: 14.6 % (ref 11.5–15.5)
WBC: 8.5 10*3/uL (ref 4.0–10.5)
nRBC: 0 % (ref 0.0–0.2)

## 2019-03-13 LAB — MAGNESIUM: Magnesium: 2.1 mg/dL (ref 1.7–2.4)

## 2019-03-13 LAB — TSH: TSH: 0.961 u[IU]/mL (ref 0.350–4.500)

## 2019-03-13 LAB — SARS CORONAVIRUS 2 (TAT 6-24 HRS): SARS Coronavirus 2: NEGATIVE

## 2019-03-13 MED ORDER — AMIODARONE HCL 200 MG PO TABS: 200.0000 mg | ORAL_TABLET | Freq: Two times a day (BID) | ORAL | 0 refills | Status: DC

## 2019-03-13 MED ORDER — AMIODARONE HCL 200 MG PO TABS: 200.0000 mg | ORAL_TABLET | Freq: Every day | ORAL | 3 refills | Status: DC

## 2019-03-13 NOTE — Discharge Summary (Addendum)
DISCHARGE SUMMARY    Patient ID: Max Rivas,  MRN: [id number], DOB/AGE: 1950-07-24 69 y.o.  Admit date: 03/12/2019 Discharge date: 03/13/2019  Primary Care Physician: Anson Fret, MD  [facility]  Primary Discharge Diagnosis:  1. VT 2. ICD shock  Secondary Discharge Diagnosis:  1. CAD 2. ICM 3. Chonic CHD (systolic)      currentlly compensated 4. COPD     Still smoking (counseled) 5. AFib     CHA2DS2Vasc is 3, on Eliquis, appropriately dosed    Allergies  Allergen Reactions   Crestor [Rosuvastatin Calcium] Other (See Comments)    Back spasms, but can tolerate it at a low dose     Procedures This Admission:  none    Brief HPI: Max Rivas is a 69 y.o. male  with a hx of CAD, (multiple prior MI's multiple stents, last heart cath was 2004 at [facility].  3 stents were placed at that time), ICM, chronic CHF (systolic), ICD, COPD, HLD, OSA intolerant of CPAP, AFib brought to [facility] after being shocked by his ICD.   He reports feeling quite well of late, no CP, palpitations or cardiac awareness, no unusual exertional intolerances or SOB.  He does have a chronic cough with his COPD. He was playing cards with family when he started feeling warm, not feeling well.  Thought maybe was his BP, several minutes passed, then feeling ;lightheaded and weak, clammy, developed chest pressure and asked family to call 911 About the time fire department was coming in his device shocked him and he felt well again immediately.     Hospital Course:  The patient was admitted found ildly hypokalemic 3.4, his HS Trop mildly elevated at 14 > 43, otherwise labs unremarkable. He was in AFib w/CVR, BP stable and remained asymptomatic through his stay. His device interrogated the report reviewed by Dr. Ladona Ridgel noting VT episode that with ATP changed to a 2nd VT that he was shocked for  successfully to AFib. His HS Trop felt 2/2 demand in setting of VT and ICD shocks. He has no symptoms of late, or since his VT of angina. He has had no symptoms, and no exam findings to suggest volume overload.  He has been in rate controlled AFib here and is known to have, appropriately anticoagulated with Eliquis.  He is unaware of his AFib with no symptoms.  He was historically on amiodarone for his Afib stopped about 3 mo ago in effort to simplify his regime, and by the patient's account told is was not working  Or not needed any longer. He has COPD, and is at his baseline for this.  Reports PFTs done only a few months ago via the [place].  The patient feels well, vitals have been stable.  He was seen and examined by Dr. Ladona Ridgel and considered stable for discharge to home.   Dr. Ladona Ridgel discussed with the patient [place] Law, no driving for 6 months.  Discussed shock plan as well.   We will discharge on amiodarone 200mg  BID for one month, then 200mg  daily Dr. will see the patient in follow up at least once, the patient would like to see him in clinic as well.    Physical Exam: Vitals:   03/13/19 0843 03/13/19 0846 03/13/19 0849 03/13/19 1208  BP:    93/77  Pulse:    72  Resp:    18  Temp:    97.8 F (36.6 C)  TempSrc:      SpO2: 98% 98% 98% 93%  Weight:      Height:         Labs:   Lab Results  Component Value Date   WBC 8.5 03/13/2019   HGB 16.3 03/13/2019   HCT 48.9 03/13/2019   MCV 93.5 03/13/2019   PLT 143 (L) 03/13/2019    Recent Labs  Lab 03/12/19 1551 03/12/19 1822 03/13/19 0348  NA   < >  --  139  K   < >  --  3.9  CL   < >  --  101  CO2   < >  --  25  BUN   < >  --  10  CREATININE   < >  --  1.23  CALCIUM   < >  --  8.8*  PROT  --  6.4*  --   BILITOT  --  0.7  --   ALKPHOS  --  55  --   ALT  --  17  --   AST  --  28  --   GLUCOSE   < >  --  95   < > = values in this interval not displayed.    Discharge Medications:  Allergies as of 03/13/2019        Reactions   Crestor [rosuvastatin Calcium] Other (See Comments)   Back spasms, but can tolerate it at a low dose        Medication List     TAKE these medications    acetaminophen 325 MG tablet Commonly known as: TYLENOL Take 2 tablets (650 mg total) by mouth every 6 (six) hours as needed for mild pain (or Fever >/= 101).   amiodarone 200 MG tablet Commonly known as: Pacerone Take 1 tablet (200 mg total) by mouth 2 (two) times daily.   amiodarone 200 MG tablet Commonly known as: Pacerone Take 1 tablet (200 mg total) by mouth daily. Start taking on: April 13, 2019   carboxymethylcellulose 0.5 % Soln Commonly known as: REFRESH PLUS Place 1 drop into both eyes at bedtime.   Eliquis 5 MG Tabs tablet Generic drug: apixaban Take 5 mg by mouth 2 (two) times daily.   Fish Oil 1000 MG Caps Take 1,000 mg by mouth in the morning and at bedtime.   furosemide 40 MG tablet Commonly known as: Lasix Take 1 tablet (40 mg total) by mouth daily. If lower extremity edema take 1 additional tablet What changed:  when to take this additional instructions   levothyroxine 75 MCG tablet Commonly known as: SYNTHROID Take 75 mcg by mouth daily before breakfast.   losartan 25 MG tablet Commonly known as: COZAAR Take 12.5 mg by mouth daily.   metoprolol succinate 50 MG 24 hr tablet Commonly known as: TOPROL-XL Take 75 mg by mouth 2 (two) times daily. Take with or immediately following a meal.   mometasone 220 MCG/INH inhaler Commonly known as: ASMANEX Inhale 2 puffs into the lungs 2 (two) times daily.   montelukast 10 MG tablet Commonly known as: SINGULAIR Take 10 mg by mouth at bedtime.   multivitamin with minerals Tabs tablet Take 1 tablet by mouth daily.   nitroGLYCERIN 0.4 MG SL tablet Commonly known as: NITROSTAT Place 0.4 mg under the tongue every 5 (five) minutes x 3 doses as needed for chest pain.   omega-3 acid ethyl esters 1 g capsule Commonly known as:  LOVAZA Take 1 capsule (1 g total) by mouth 2 (two) times daily.   omeprazole 20 MG capsule Commonly known as: PRILOSEC Take 20 mg by mouth 2 (two) times daily before a meal.   rosuvastatin 40 MG tablet Commonly known as: CRESTOR Take 20 mg by mouth at bedtime.   spironolactone 25 MG tablet Commonly known as: ALDACTONE Take 1 tablet (25 mg total) by mouth daily.   Stiolto Respimat 2.5-2.5 MCG/ACT Aers Generic drug: Tiotropium Bromide-Olodaterol Inhale 2 puffs into the lungs daily.   Systane Balance 0.6 % Soln Generic drug: Propylene Glycol Place 1 drop into both eyes See admin instructions. Place 1 drop into each eye four times a day and apply a warm compress for 10 minutes afterwards   Vitamin D3 50 MCG (2000 UT) Tabs Take 2,000 Units by mouth daily.   Xopenex HFA 45 MCG/ACT inhaler Generic drug: levalbuterol Inhale 2 puffs into the lungs every 6 (six) hours as needed for wheezing or shortness of breath.        Disposition:  home Discharge Instructions     Diet - low sodium heart healthy   Complete by: As directed    Increase activity slowly   Complete by: As directed       Follow-up Information     Evans Lance, MD Follow up.   Specialty: Cardiology Why: You will be called by Dr. Tanna Furry scheduler to make a 6 week follow up appointment Contact information: [address] [address] [phone]            Duration of Discharge Encounter: Greater than 30 minutes including physician time.  Venetia Night, PA-C 03/13/2019 1:04 PM  EP Attending  Patient seen and examined. Agree with the findings as noted above. The patient presents with sustained VT requiring ICD shock. He has been stable over night. He denies any symptoms. No chest pain or sob. He will be discharged home back on amiodarone 200 bid for a month then 200 mg daily thereafter. He does not drive.  Mikle Bosworth.D.

## 2019-03-13 NOTE — TOC Progression Note (Signed)
Transition of Care [facility]) - Progression Note    Patient Details  Name: Max Rivas MRN: [id number] Date of Birth: 1950/11/24  [facility] CM/SW Contact  Leone Haven, RN Phone Number: 03/13/2019, 1:36 PM  Clinical Narrative:    Patient is for dc home today, NCM asked patient if he would to be set up with a [facility] for CHF management, he states no he does not need that.  He states he has transportation home also no issues with getting meds.         Expected Discharge Plan and Services           Expected Discharge Date: 03/13/19                                     Social Determinants of Health (SDOH) Interventions    Readmission Risk Interventions No flowsheet data found.

## 2019-03-13 NOTE — Consult Note (Addendum)
Cardiology Consultation:   Patient ID: DEMETRIAS GOODBAR MRN: [id number]; DOB: 29-Oct-1950  Admit date: 03/12/2019 Date of Consult: 03/13/2019  Primary Care Provider: [name], MD Primary Cardiologist: Dr. Tamala Julian Primary Electrophysiologist:  Dr. Lovena Le   Patient Profile:   GOTHAM RADEN is a 69 y.o. male with a hx of CAD, (multiple prior MI's multiple stents, last heart cath was 2004 at [facility].  3 stents were placed at that time), ICM, chronic CHF (systolic), ICD, COPD, HLD, OSA intolerant of CPAP, AFib, who is being seen today for the evaluation of ICD shock at the request of Dr. Neena Rhymes.  History of Present Illness:   Mr. Sydnor was admitted to [facility] yesterday after being shocked by his ICD.  He reports feeling quite well of late, no CP, palpitations or cardiac awareness, no unusual exertional intolerances or SOB.  He does have a chronic cough with his COPD. He was playing cards with family when he started feeling warm, not feeling well.  Thought maybe was his BP, several minutes passed, then feeling ;lightheaded and weak, clammy, developed chest pressure and asked family to call 911 About the time fire department was coming in his device shocked him and he felt well again immediately.  From the start of symptoms to time of shock he estimates was 15 minutes  He has been following his device and care back via the [place] in [place].  He says his amiodarone was stopped "just a while ago" in efforts to reduce his medicine burden, and mentions of not needing it or being on it "long enough"   LABS K+ 3.4 > 3.9 Mag 1.9 > 2.1 BUN/Creat 10/1.23 WBC 8.5 H/H 16/48 Plts 143  TSH 0.961  HS Trop 14 > 43  COVID negative  Device information MDT single chamber ICD, implanted 2005 (original Fidelis lead is abandoned), new RV lead and gen change 2013  Heart Pathway Score:     Past Medical History:  Diagnosis Date  . Arthritis    "elbows, knees" (02/24/2016)  .  Bronchitis 2006  . CHF (congestive heart failure) ([place])   . COPD (chronic obstructive pulmonary disease) ([place])   . Coronary artery disease   . GERD (gastroesophageal reflux disease)   . High cholesterol   . Myocardial infarction [facility]) ~ 1995 X 2;1998; 2000; 2004  . On home oxygen therapy    "2L w/activity" (02/24/2016)  . Pacemaker   . Pulmonary embolism ([place]) 02/24/2016  . Sleep apnea    "have CPAP; can't tolerate it" (02/24/2016)    Past Surgical History:  Procedure Laterality Date  . CORONARY ANGIOPLASTY  1995  . CORONARY ANGIOPLASTY WITH STENT PLACEMENT  ~ 1995 - 2004 X 5   "I've got a total of 5 stents" (02/24/2016)  . 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 PACEMAKER GENERATOR CHANGE  03/2011    [facility] Medications:  Prior to Admission medications   Medication Sig Start Date End Date Taking? Authorizing Provider  acetaminophen (TYLENOL) 325 MG tablet Take 2 tablets (650 mg total) by mouth every 6 (six) hours as needed for mild pain (or Fever >/= 101). 03/05/16  Yes Robbie Lis, MD  apixaban (ELIQUIS) 5 MG TABS tablet Take 5 mg by mouth 2 (two) times daily.   Yes [provider]  carboxymethylcellulose (REFRESH PLUS) 0.5 % SOLN Place  1 drop into both eyes at bedtime.   Yes [provider]  Cholecalciferol (VITAMIN D3) 50 MCG (2000 UT) TABS Take 2,000 Units by mouth daily.   Yes [provider]  furosemide (LASIX) 40 MG tablet Take 1 tablet (40 mg total) by mouth daily. If lower extremity edema take 1 additional tablet Patient taking differently: Take 40 mg by mouth See admin instructions. Take 40 mg by mouth once a day and an additional 40 mg as needed for extremity edema 06/23/18 06/23/19 Yes Black, Lesle Chris, NP  levalbuterol [facility] HFA) 45 MCG/ACT inhaler Inhale 2 puffs into the lungs every 6 (six) hours as needed for wheezing or shortness of breath.   Yes  [provider]  levothyroxine (SYNTHROID) 75 MCG tablet Take 75 mcg by mouth daily before breakfast.   Yes [provider]  losartan (COZAAR) 25 MG tablet Take 12.5 mg by mouth daily.   Yes [provider]  metoprolol succinate (TOPROL-XL) 50 MG 24 hr tablet Take 75 mg by mouth 2 (two) times daily. Take with or immediately following a meal.   Yes [provider]  mometasone (ASMANEX) 220 MCG/INH inhaler Inhale 2 puffs into the lungs 2 (two) times daily.    Yes [provider]  montelukast (SINGULAIR) 10 MG tablet Take 10 mg by mouth at bedtime.   Yes [provider]  Multiple Vitamin (MULTIVITAMIN WITH MINERALS) TABS tablet Take 1 tablet by mouth daily.   Yes [provider]  Omega-3 Fatty Acids (FISH OIL) 1000 MG CAPS Take 1,000 mg by mouth in the morning and at bedtime.   Yes [provider]  omeprazole (PRILOSEC) 20 MG capsule Take 20 mg by mouth 2 (two) times daily before a meal.   Yes [provider]  Propylene Glycol (SYSTANE BALANCE) 0.6 % SOLN Place 1 drop into both eyes See admin instructions. Place 1 drop into each eye four times a day and apply a warm compress for 10 minutes afterwards   Yes [provider]  rosuvastatin (CRESTOR) 40 MG tablet Take 20 mg by mouth at bedtime.    Yes [provider]  spironolactone (ALDACTONE) 25 MG tablet Take 1 tablet (25 mg total) by mouth daily. 03/06/16  Yes Graciella Freer, PA-C  Tiotropium Bromide-Olodaterol (STIOLTO RESPIMAT) 2.5-2.5 MCG/ACT AERS Inhale 2 puffs into the lungs daily.    Yes [provider]  nitroGLYCERIN (NITROSTAT) 0.4 MG SL tablet Place 0.4 mg under the tongue every 5 (five) minutes x 3 doses as needed for chest pain.    [provider]  omega-3 acid ethyl esters (LOVAZA) 1 g capsule Take 1 capsule (1 g total) by mouth 2 (two) times daily. Patient not taking: Reported on 03/12/2019 03/05/16   Alison Murray, MD     Inpatient Medications: Scheduled Meds: . apixaban  5 mg Oral BID  . arformoterol  15 mcg Nebulization BID  . budesonide  0.25 mg Inhalation BID  . cholecalciferol  2,000 Units Oral Daily  . furosemide  40 mg Oral Daily  . levothyroxine  75 mcg Oral QAC breakfast  . losartan  12.5 mg Oral Daily  . metoprolol succinate  75 mg Oral BID  . montelukast  10 mg Oral QHS  . multivitamin with minerals  1 tablet Oral Daily  . pantoprazole  80 mg Oral Daily  . rosuvastatin  20 mg Oral QHS  . sodium chloride flush  3 mL Intravenous Q12H  . spironolactone  25 mg  Oral Daily  . umeclidinium bromide  1 puff Inhalation Daily   Continuous Infusions: . sodium chloride    . magnesium sulfate bolus IVPB     PRN Meds: sodium chloride, acetaminophen, albuterol, ondansetron (ZOFRAN) IV, polyvinyl alcohol, sodium chloride flush  Allergies:    Allergies  Allergen Reactions  . Crestor [Rosuvastatin Calcium] Other (See Comments)    Back spasms, but can tolerate it at a low dose    Social History:   Social History   Socioeconomic History  . Marital status: Divorced    Spouse name: Not on file  . Number of children: Not on file  . Years of education: Not on file  . Highest education level: Not on file  Occupational History  . Not on file  Tobacco Use  . Smoking status: Current Some Day Smoker    Packs/day: 3.00    Years: 35.00    Pack years: 105.00    Types: Cigarettes  . Smokeless tobacco: Never Used  . Tobacco comment: 02/24/2016 "stopped ~ 8-9 months ago; have had 5 cigarettes in the last week"  Substance and Sexual Activity  . Alcohol use: No    Comment: 02/24/2016 "none in a long time"  . Drug use: Yes    Types: Cocaine    Comment: 02/24/2016 "none since 1995"  . Sexual activity: Yes  Other Topics Concern  . Not on file  Social History Narrative  . Not on file   Social Determinants of Health   Financial Resource Strain:   . Difficulty of Paying Living Expenses: Not on file   Food Insecurity:   . Worried About Programme researcher, broadcasting/film/video in the Last Year: Not on file  . Ran Out of Food in the Last Year: Not on file  Transportation Needs:   . Lack of Transportation (Medical): Not on file  . Lack of Transportation (Non-Medical): Not on file  Physical Activity:   . Days of Exercise per Week: Not on file  . Minutes of Exercise per Session: Not on file  Stress:   . Feeling of Stress : Not on file  Social Connections:   . Frequency of Communication with Friends and Family: Not on file  . Frequency of Social Gatherings with Friends and Family: Not on file  . Attends Religious Services: Not on file  . Active Member of Clubs or Organizations: Not on file  . Attends Banker Meetings: Not on file  . Marital Status: Not on file  Intimate Partner Violence:   . Fear of Current or Ex-Partner: Not on file  . Emotionally Abused: Not on file  . Physically Abused: Not on file  . Sexually Abused: Not on file    Family History:   Family History  Problem Relation Age of Onset  . Heart attack Mother   . Heart attack Father   . Heart attack Sister 74     ROS:  Please see the history of present illness.  All other ROS reviewed and negative.     Physical Exam/Data:   Vitals:   03/13/19 0817 03/13/19 0843 03/13/19 0846 03/13/19 0849  BP: 101/71     Pulse: 83     Resp:      Temp:      TempSrc:      SpO2: 92% 98% 98% 98%  Weight:      Height:        Intake/Output Summary (Last 24 hours) at 03/13/2019 0957 Last data filed at 03/13/2019 0900  Gross per 24 hour  Intake 1182 ml  Output 950 ml  Net 232 ml   Last 3 Weights 03/13/2019 03/12/2019 03/12/2019  Weight (lbs) 236 lb 14.4 oz 237 lb 1.6 oz 237 lb  Weight (kg) 107.457 kg 107.548 kg 107.502 kg     Body mass index is 33.04 kg/m.  General:  Well nourished, well developed, in no acute distress HEENT: normal Lymph: no adenopathy Neck: no JVD Endocrine:  No thryomegaly Vascular: No carotid bruits Cardiac:  irreg-irreg; no murmurs, gallops or rubs Lungs:  scatterred ronchi, no wheezing, no wheezing, or rales  Abd: soft, nontender, obese  Ext: no edema Musculoskeletal:  No deformities Skin: warm and dry  Neuro:   no focal abnormalities noted Psych:  Normal affect   EKG:  The EKG was personally reviewed and demonstrates:   AFib 96bpm, no ischemic changes Telemetry:  Telemetry was personally reviewed and demonstrates:   AFib 70's-80's  Relevant CV Studies:  06/22/2018: TTE IMPRESSIONS  1. Suboptimal acoustic windows significantly degrade diagnostic capacity  of study.  2. The left ventricle has moderate-severely reduced systolic function,  with an ejection fraction of 30-35%. The cavity size was normal. Left  ventricular diastolic Doppler parameters are consistent with  pseudonormalization. Elevated left ventricular  end-diastolic pressure The E/e' is 16.  3. The inferior vena cava was dilated in size with >50% respiratory  variability.  Laboratory Data:  High Sensitivity Troponin:   Recent Labs  Lab 03/12/19 1551 03/12/19 1822  TROPONINIHS 14 43*     Chemistry Recent Labs  Lab 03/12/19 1551 03/13/19 0348  NA 136 139  K 3.4* 3.9  CL 99 101  CO2 25 25  GLUCOSE 164* 95  BUN 8 10  CREATININE 1.20 1.23  CALCIUM 8.7* 8.8*  GFRNONAA >60 >60  GFRAA >60 >60  ANIONGAP 12 13    Recent Labs  Lab 03/12/19 1822  PROT 6.4*  ALBUMIN 3.4*  AST 28  ALT 17  ALKPHOS 55  BILITOT 0.7   Hematology Recent Labs  Lab 03/12/19 1551 03/13/19 0348  WBC 7.7 8.5  RBC 5.40 5.23  HGB 16.8 16.3  HCT 50.2 48.9  MCV 93.0 93.5  MCH 31.1 31.2  MCHC 33.5 33.3  RDW 14.6 14.6  PLT 137* 143*   BNPNo results for input(s): BNP, PROBNP in the last 168 hours.  DDimer No results for input(s): DDIMER in the last 168 hours.   Radiology/Studies:   DG Chest Port 1 View Result Date: 03/12/2019 CLINICAL DATA:  Chest pain EXAM: PORTABLE CHEST 1 VIEW COMPARISON:  06/21/2018 FINDINGS:  Cardiomegaly with left chest multi lead pacer defibrillator. Small, chronic, calcified right pleural effusion. The visualized skeletal structures are unremarkable. IMPRESSION: 1. Cardiomegaly without acute abnormality of the lungs in AP portable projection. 2.  Small, chronic, calcified right pleural effusion. Electronically Signed   By: Lauralyn Primes M.D.   On: 03/12/2019 16:24   {   Assessment and Plan:    1. ICD shock     Unfortunately I am unable to track down his interrogation     I have asked MDT to re-interrogate his device.  The H&P makes some mention of suspect AFib w/RVR that may have been treated inappropriately with ATP that provoked true V arrhythmia.  Await interrogation, single lead device, may be difficlut to know for sure He was symptomatic and awake for  2. AFib     CHA2DS2Vasc is 3, on Eliquis, approprietly dosed     He remains  in AFib despite a shock, and asymptomatic, rate controlled  3. CAD     No anginal symptoms of late     HS mildly abnormal, likely 2/2 tachycardia and shock     On BB and statin, no ASA likely 2/2 his Eliquis     Await device interrogation, though no symptoms to suggest new/worsened CAD  For questions or updates, please contact [facility] Please consult [email or web address] for contact info under   Signed, Sheilah Pigeon, PA-C  03/13/2019 9:57 AM   EP Attending  Patient seen and examined. Agree with the findings as noted above. The patient was admitted after experiencing near syncope and an ICD shock while playing cards. He is known to me from prior clinic visits. He is also followed at the [facility]. He was playing cardis with his roommate. He had been feeling well when he suddenly developed dizziness and felt poorly, like he was going to pass out. He was shocked and immediately felt better. He notes that he stopped his amiodarone 3 months ago. He denies chest pain or sob or edema. He still smokes but cut back markedly. On  exam he is well appearing 69 yo man NAD and lungs are clear and CV is an IRIR rhythm with minimal edema. Tele reveals atrial fib with a controlled VR. ICD interrogation demonstrates underlying atrial fib with VT, failed ATP and successful shock, eliminating VT. A/P 1. Recurrent VT - I have discussed the treatment options. I have recommended he restart amiodarone 200 bid and reduce down in a month.  2. ICM - his enzymes are up a bit but he has had VT. No additional workup.  3. Atrial fib - his vr is controlled. He will continue his blood thinners. His rate is well controlled.  Leonia Reeves.D.

## 2019-03-13 NOTE — Discharge Instructions (Signed)
No driving for 6 months. 

## 2019-04-25 ENCOUNTER — Encounter: Payer: Medicare Other | Admitting: Internal Medicine

## 2019-05-10 ENCOUNTER — Emergency Department (HOSPITAL_COMMUNITY): Payer: No Typology Code available for payment source

## 2019-05-10 ENCOUNTER — Emergency Department (EMERGENCY_DEPARTMENT_HOSPITAL)
Admission: EM | Admit: 2019-05-10 | Discharge: 2019-05-10 | Disposition: A | Discharge status: Home / Self Care | Payer: No Typology Code available for payment source | Source: Home / Self Care | Attending: Emergency Medicine | Admitting: Emergency Medicine

## 2019-05-10 ENCOUNTER — Encounter (HOSPITAL_COMMUNITY): Payer: Self-pay | Admitting: *Deleted

## 2019-05-10 ENCOUNTER — Other Ambulatory Visit: Payer: Self-pay

## 2019-05-10 DIAGNOSIS — I472 Ventricular tachycardia: Secondary | ICD-10-CM

## 2019-05-10 DIAGNOSIS — I130 Hypertensive heart and chronic kidney disease with heart failure and stage 1 through stage 4 chronic kidney disease, or unspecified chronic kidney disease: Secondary | ICD-10-CM | POA: Insufficient documentation

## 2019-05-10 DIAGNOSIS — R0789 Other chest pain: Secondary | ICD-10-CM | POA: Diagnosis not present

## 2019-05-10 DIAGNOSIS — Z955 Presence of coronary angioplasty implant and graft: Secondary | ICD-10-CM | POA: Insufficient documentation

## 2019-05-10 DIAGNOSIS — I2510 Atherosclerotic heart disease of native coronary artery without angina pectoris: Secondary | ICD-10-CM | POA: Insufficient documentation

## 2019-05-10 DIAGNOSIS — F17210 Nicotine dependence, cigarettes, uncomplicated: Secondary | ICD-10-CM | POA: Insufficient documentation

## 2019-05-10 DIAGNOSIS — Z950 Presence of cardiac pacemaker: Secondary | ICD-10-CM | POA: Insufficient documentation

## 2019-05-10 DIAGNOSIS — N189 Chronic kidney disease, unspecified: Secondary | ICD-10-CM | POA: Insufficient documentation

## 2019-05-10 DIAGNOSIS — I5020 Unspecified systolic (congestive) heart failure: Secondary | ICD-10-CM | POA: Insufficient documentation

## 2019-05-10 DIAGNOSIS — R079 Chest pain, unspecified: Secondary | ICD-10-CM | POA: Insufficient documentation

## 2019-05-10 DIAGNOSIS — E039 Hypothyroidism, unspecified: Secondary | ICD-10-CM | POA: Insufficient documentation

## 2019-05-10 DIAGNOSIS — I252 Old myocardial infarction: Secondary | ICD-10-CM | POA: Insufficient documentation

## 2019-05-10 DIAGNOSIS — Z79899 Other long term (current) drug therapy: Secondary | ICD-10-CM | POA: Insufficient documentation

## 2019-05-10 DIAGNOSIS — Z7901 Long term (current) use of anticoagulants: Secondary | ICD-10-CM | POA: Insufficient documentation

## 2019-05-10 LAB — BASIC METABOLIC PANEL
Anion gap: 13 (ref 5–15)
BUN: 13 mg/dL (ref 8–23)
CO2: 28 mmol/L (ref 22–32)
Calcium: 9.2 mg/dL (ref 8.9–10.3)
Chloride: 99 mmol/L (ref 98–111)
Creatinine, Ser: 1.42 mg/dL — ABNORMAL HIGH (ref 0.61–1.24)
GFR calc Af Amer: 59 mL/min — ABNORMAL LOW (ref >60)
GFR calc non Af Amer: 51 mL/min — ABNORMAL LOW (ref >60)
Glucose, Bld: 116 mg/dL — ABNORMAL HIGH (ref 70–99)
Potassium: 4.4 mmol/L (ref 3.5–5.1)
Sodium: 140 mmol/L (ref 135–145)

## 2019-05-10 LAB — CBC
HCT: 50.2 % (ref 39.0–52.0)
Hemoglobin: 16.8 g/dL (ref 13.0–17.0)
MCH: 31.8 pg (ref 26.0–34.0)
MCHC: 33.5 g/dL (ref 30.0–36.0)
MCV: 94.9 fL (ref 80.0–100.0)
Platelets: 153 10*3/uL (ref 150–400)
RBC: 5.29 MIL/uL (ref 4.22–5.81)
RDW: 14.7 % (ref 11.5–15.5)
WBC: 8.2 10*3/uL (ref 4.0–10.5)
nRBC: 0 % (ref 0.0–0.2)

## 2019-05-10 LAB — TROPONIN I (HIGH SENSITIVITY)
Troponin I (High Sensitivity): 21 ng/L — ABNORMAL HIGH (ref <18)
Troponin I (High Sensitivity): 18 ng/L — ABNORMAL HIGH (ref <18)

## 2019-05-10 MED ORDER — AMIODARONE HCL 400 MG PO TABS: 400.0000 mg | ORAL_TABLET | Freq: Two times a day (BID) | ORAL | 0 refills | Status: DC

## 2019-05-10 MED ORDER — SODIUM CHLORIDE 0.9% FLUSH: 3.0000 mL | Freq: Once | INTRAVENOUS | Status: DC

## 2019-05-10 NOTE — ED Triage Notes (Signed)
Pt is here with chest pain this am and defib firing last pm.  Pt has history of MI times 5. Pt did receive 2 nitro by ems and oxygen and CP relieved, Pt did have some sob and nausea with some dizziness. Chest pain free

## 2019-05-10 NOTE — Consult Note (Addendum)
Cardiology Consultation:   Patient ID: HAPPY KY MRN: [id number]; DOB: [birth year]  Admit date: 05/10/2019 Date of Consult: 05/10/2019  Primary Care Provider: Anson Fret, MD Primary Cardiologist: Dr. Katrinka Blazing (though currently being followed at the [facility]) Primary Electrophysiologist:  Dr. Ladona Ridgel (though currently being followed at the [facility])   Patient Profile:   Max Rivas is a 69 y.o. male with a hx of  CAD, (multiple prior MI's multiple stents, last heart cath was 2004 at [facility].  3 stents were placed at that time), ICM, chronic CHF (systolic), ICD, COPD, HLD, OSA intolerant of CPAP, AFib who is being seen today for the evaluation of VT/ICD shock at the request of Dr. Donnald Garre.  Device information MDT single chamber implanted 03/19/2011 Initial implant 2005  + hx of appropriate therapies AAD amiodarone (on historically) and restarted March 2021  History of Present Illness:   Max Rivas was previously hospitalized here at [facility] 03/12/19 with appropriate ICD shock for VT.  At that time, no symptoms of angina, mild abl HS Trop felt demand in setting of VT/ICD shock.  He had recently been taken of amiodarone a few months prior (it seems 2/2 to ongoing AFib and not felt to be needed).  He was resumed on amiodarone (200mg  BID for 32mo then daily) and discharged 03/13/2019.  He returns to [facility] today.  States he was walking into the store today got about 1/2 way to the entrance started more SOB then usual, feeling weak, a little lightheaded and though he was going to get shocked.  He walked back to his car did not want to fall/faint in the parking lot and once in his car felt a little central CP.  No radiation, took a s/l NTG with relief and called 911. He has remained CP free.  Since his discharge last month he states that he has been in communication with his cardiologist att he [facility]. He took the amio BID as instructed and has been taking it daily ever since.  He says that  he got a shock last night by his device, was playing pool felt the same as prior with onset of feeling hot, weak, lightheaded and sat down was shocked.  He did not have full LOC but was near that, again he felt immediately better afterwards.  He says 05/02/19 he was shocked had the same exact symptoms while planting tomatoes.  After this is when his cardiologist reached out to him and got tomorrow's appt.  He feels at his baseline, today was the 1st time he has had CP in years.  He has no rest SOB, he has baseline DOE that he feels over the years has gotten a little worse with his COPD though no acute changes in his exertional capacity.    LABS K+ 4.4 BUN/Creat 13/1.42 HS Trop 21 WBC 8.2 H/H 16/50 Plts 153   Past Medical History:  Diagnosis Date   Arthritis    "elbows, knees" (02/24/2016)   Bronchitis 2006   CHF (congestive heart failure) (HCC)    COPD (chronic obstructive pulmonary disease) (HCC)    Coronary artery disease    GERD (gastroesophageal reflux disease)    High cholesterol    Myocardial infarction (HCC) ~ 1995 X 2;1998; 2000; 2004   On home oxygen therapy    "2L w/activity" (02/24/2016)   Pacemaker    Pulmonary embolism (HCC) 02/24/2016   Sleep apnea    "have CPAP; can't tolerate it" (02/24/2016)    Past Surgical  History:  Procedure Laterality Date   CORONARY ANGIOPLASTY  1995   CORONARY ANGIOPLASTY WITH STENT PLACEMENT  ~ 1995 - 2004 X 5   "I've got a total of 5 stents" (02/24/2016)   INSERT / REPLACE / REMOVE PACEMAKER  07/2003   original PPM around 2004 for ICM with EF < 35%   PACEMAKER GENERATOR CHANGE  03/2011    [facility] [place]   TONSILLECTOMY       Home Medications:  Prior to Admission medications   Medication Sig Start Date End Date Taking? Authorizing Provider  acetaminophen (TYLENOL) 325 MG tablet Take 2 tablets (650 mg total) by mouth every 6 (six) hours as needed for mild pain (or Fever >/= 101). 03/05/16   Alison Murray, MD  amiodarone (PACERONE) 200 MG  tablet Take 1 tablet (200 mg total) by mouth 2 (two) times daily. 03/13/19 04/12/19  Sheilah Pigeon, PA-C  amiodarone (PACERONE) 200 MG tablet Take 1 tablet (200 mg total) by mouth daily. 04/13/19   Sheilah Pigeon, PA-C  apixaban (ELIQUIS) 5 MG TABS tablet Take 5 mg by mouth 2 (two) times daily.    [provider]  carboxymethylcellulose (REFRESH PLUS) 0.5 % SOLN Place 1 drop into both eyes at bedtime.    [provider]  Cholecalciferol (VITAMIN D3) 50 MCG (2000 UT) TABS Take 2,000 Units by mouth daily.    [provider]  furosemide (LASIX) 40 MG tablet Take 1 tablet (40 mg total) by mouth daily. If lower extremity edema take 1 additional tablet Patient taking differently: Take 40 mg by mouth See admin instructions. Take 40 mg by mouth once a day and an additional 40 mg as needed for extremity edema 06/23/18 06/23/19  Gwenyth Bender, NP  levalbuterol [facility] HFA) 45 MCG/ACT inhaler Inhale 2 puffs into the lungs every 6 (six) hours as needed for wheezing or shortness of breath.    [provider]  levothyroxine (SYNTHROID) 75 MCG tablet Take 75 mcg by mouth daily before breakfast.    [provider]  losartan (COZAAR) 25 MG tablet Take 12.5 mg by mouth daily.    [provider]  metoprolol succinate (TOPROL-XL) 50 MG 24 hr tablet Take 75 mg by mouth 2 (two) times daily. Take with or immediately following a meal.    [provider]  mometasone (ASMANEX) 220 MCG/INH inhaler Inhale 2 puffs into the lungs 2 (two) times daily.     [provider]  montelukast (SINGULAIR) 10 MG tablet Take 10 mg by mouth at bedtime.    [provider]  Multiple Vitamin (MULTIVITAMIN WITH MINERALS) TABS tablet Take 1 tablet by mouth daily.    [provider]  nitroGLYCERIN (NITROSTAT) 0.4 MG SL tablet Place 0.4 mg under the tongue every 5 (five) minutes x 3 doses as needed for chest pain.    [provider]  omega-3 acid ethyl  esters (LOVAZA) 1 g capsule Take 1 capsule (1 g total) by mouth 2 (two) times daily. Patient not taking: Reported on 03/12/2019 03/05/16   Alison Murray, MD  Omega-3 Fatty Acids (FISH OIL) 1000 MG CAPS Take 1,000 mg by mouth in the morning and at bedtime.    [provider]  omeprazole (PRILOSEC) 20 MG capsule Take 20 mg by mouth 2 (two) times daily before a meal.    [provider]  Propylene Glycol (SYSTANE BALANCE) 0.6 % SOLN Place 1 drop into both eyes See admin instructions. Place 1 drop into  each eye four times a day and apply a warm compress for 10 minutes afterwards    [provider]  rosuvastatin (CRESTOR) 40 MG tablet Take 20 mg by mouth at bedtime.     [provider]  spironolactone (ALDACTONE) 25 MG tablet Take 1 tablet (25 mg total) by mouth daily. 03/06/16   Shirley Friar, PA-C  Tiotropium Bromide-Olodaterol (STIOLTO RESPIMAT) 2.5-2.5 MCG/ACT AERS Inhale 2 puffs into the lungs daily.     [provider]    Inpatient Medications: Scheduled Meds:  sodium chloride flush  3 mL Intravenous Once   Continuous Infusions:   PRN Meds:   Allergies:    Allergies  Allergen Reactions   Crestor [Rosuvastatin Calcium] Other (See Comments)    Back spasms, but can tolerate it at a low dose    Social History:   Social History   Socioeconomic History   Marital status: Divorced    Spouse name: Not on file   Number of children: Not on file   Years of education: Not on file   Highest education level: Not on file  Occupational History   Not on file  Tobacco Use   Smoking status: Current Some Day Smoker    Packs/day: 3.00    Years: 35.00    Pack years: 105.00    Types: Cigarettes   Smokeless tobacco: Never Used   Tobacco comment: 02/24/2016 "stopped ~ 8-9 months ago; have had 5 cigarettes in the last week"  Substance and Sexual Activity   Alcohol use: Yes    Comment: 02/24/2016 "none in a long time"   Drug use: Not Currently     Types: Cocaine    Comment: 02/24/2016 "none since 1995"   Sexual activity: Yes  Other Topics Concern   Not on file  Social History Narrative   Not on file   Social Determinants of Health   Financial Resource Strain:    Difficulty of Paying Living Expenses:   Food Insecurity:    Worried About Charity fundraiser in the Last Year:    Arboriculturist in the Last Year:   Transportation Needs:    Film/video editor (Medical):    Lack of Transportation (Non-Medical):   Physical Activity:    Days of Exercise per Week:    Minutes of Exercise per Session:   Stress:    Feeling of Stress :   Social Connections:    Frequency of Communication with Friends and Family:    Frequency of Social Gatherings with Friends and Family:    Attends Religious Services:    Active Member of Clubs or Organizations:    Attends Music therapist:    Marital Status:   Intimate Partner Violence:    Fear of Current or Ex-Partner:    Emotionally Abused:    Physically Abused:    Sexually Abused:     Family History:   Family History  Problem Relation Age of Onset   Heart attack Mother    Heart attack Father    Heart attack Sister 59     ROS:  Please see the history of present illness.  All other ROS reviewed and negative.     Physical Exam/Data:   Vitals:   05/10/19 1215 05/10/19 1230 05/10/19 1245 05/10/19 1300  BP: 102/61 118/70 (!) 115/57 113/71  Pulse: (!) 55 (!) 56 (!) 58 60  Resp: 16 18 (!) 21 16  Temp:      TempSrc:  SpO2: 95% 95% 100% 97%    Intake/Output Summary (Last 24 hours) at 05/10/2019 1329 Last data filed at 05/10/2019 1121 Gross per 24 hour  Intake 0 ml  Output --  Net 0 ml   Last 3 Weights 03/13/2019 03/12/2019 03/12/2019  Weight (lbs) 236 lb 14.4 oz 237 lb 1.6 oz 237 lb  Weight (kg) 107.457 kg 107.548 kg 107.502 kg     There is no height or weight on file to calculate BMI.  General:  Well nourished, well developed, in no acute distress HEENT:  normal Lymph: no adenopathy Neck: no JVD Endocrine:  No thryomegaly Vascular: No carotid bruits Cardiac:  RRR; soft SM, no gallops or rubs Lungs:  CTA b/l, no wheezing, rhonchi or rales  Abd: soft, nontender  Ext: no edema Musculoskeletal:  No deformities Skin: warm and dry  Neuro:  no focal abnormalities noted Psych:  Normal affect   EKG:  The EKG was personally reviewed and demonstrates:    SR 60bpm, no acute or ischemic changes   Telemetry:  Telemetry was personally reviewed and demonstrates:   SR  Relevant CV Studies:  06/22/2018: TTE IMPRESSIONS   1. Suboptimal acoustic windows significantly degrade diagnostic capacity  of study.   2. The left ventricle has moderate-severely reduced systolic function,  with an ejection fraction of 30-35%. The cavity size was normal. Left  ventricular diastolic Doppler parameters are consistent with  pseudonormalization. Elevated left ventricular  end-diastolic pressure The E/e' is 16.   3. The inferior vena cava was dilated in size with >50% respiratory  Variability.   Laboratory Data:  High Sensitivity Troponin:   Recent Labs  Lab 05/10/19 1129  TROPONINIHS 21*     Chemistry Recent Labs  Lab 05/10/19 1129  NA 140  K 4.4  CL 99  CO2 28  GLUCOSE 116*  BUN 13  CREATININE 1.42*  CALCIUM 9.2  GFRNONAA 51*  GFRAA 59*  ANIONGAP 13    No results for input(s): PROT, ALBUMIN, AST, ALT, ALKPHOS, BILITOT in the last 168 hours. Hematology Recent Labs  Lab 05/10/19 1129  WBC 8.2  RBC 5.29  HGB 16.8  HCT 50.2  MCV 94.9  MCH 31.8  MCHC 33.5  RDW 14.7  PLT 153   BNPNo results for input(s): BNP, PROBNP in the last 168 hours.  DDimer No results for input(s): DDIMER in the last 168 hours.   Radiology/Studies:  No results found. {    Assessment and Plan:   1. ICD shock, VT     His initial implant was 2005, he tells me right after his initial implant he got shocked 15 times, he was told 2/2 to the device, none  again until last month's VT  His carelink transmission is unavailable to me other then a narrative mentioning that since 04/20/2019 he had 2 treated VT episodes, the most recent last night lasting 30seconds treated with ATP failed, terminated with 35J. Mentions 2 VT episodes accelerated to VF He also had a monitored VT episode yesterday The full interrogation is not yet transmitted.  MDT rep Max Rivas interrogate with programmer    2. AFib (longstanding persistent)     CHA2DS2Vasc is 3, on Eliquis appropriately dosed     SR post ICD shock       3. CAD     He had exertional CP today, though none otherwise     His HS Trop is 21  4. ICM     He does not appear volume OL by  exam or CXR   I think cath is reasonable, it has been years, though he sees his primary cardiologist tomorrow Dr. Elberta Fortis Jamielyn Petrucci see him later today, discuss admit vs discharge with re-load of amio nd see his primary cardiologist as planned     For questions or updates, please contact [facility] Please consult [email or web address] for contact info under     Signed, Sheilah Pigeon, PA-C  05/10/2019 1:29 PM  I have seen and examined this patient with Francis Dowse.  Agree with above, note added to reflect my findings.  On exam, RRR, no murmurs, lungs clear.  Patient presented to the urgency room after ICD shocks x3 over the last few days.  He was working in his garden, playing pool, and walking into a store on 3 separate occasions.  His most recent episode, he did have some chest discomfort which was relieved by nitroglycerin.  His troponins, fortunately have been 21 and 18 which likely indicates no ischemia.  He is on amiodarone and has recently been loaded at 200 mg twice a day.  We Max Rivas increase his amiodarone to 400 mg twice daily and have him follow-up with his primary cardiologist.  He does have an appointment tomorrow.  His episodes of ventricular tachycardia at a rate of 194 bpm.  Each episode had an attempt at ATP  which failed and he subsequently received ICD shock which converted him to sinus rhythm on his most recent shock.  Andrienne Havener M. Enzo Treu MD 05/10/2019 4:22 PM

## 2019-05-10 NOTE — ED Provider Notes (Signed)
[name] EMERGENCY DEPARTMENT Provider Note   CSN: [id number] Arrival date & time: 05/10/19  1100     History Chief Complaint  Patient presents with  . Chest Pain    ABDOUL ENCINAS is a 69 y.o. male.  HPI 69 year old male with an extensive medical history including chronic A. fib, CKD, CHF s/p pacemaker/AICD placement, COPD, CAD on Eliquis presents to the ED after with chest pain, lightheadedness which happened last night and again this morning.  Patient has a history of 5 MIs per triage.  Patient states that he was at a pool game with his friends last night when he started to feel hot and clammy.  He reports that his defibrillator went off and he was shocked.  Shortly after the shock he felt better and continued on with his game.  However again this morning was walking to the store when he started to feel lightheaded again.  He said he went to his car and began to feel chest pain, took some nitro with no relief.  Patient then called EMS.  Received another dose of nitro and oxygen via EMS and reports relief of chest pain.  At the time of the episode he did have some chest pain, shortness of breath, nausea.  Patient in the ED stating he is feeling much better though not quite back to baseline.  He has not had any additional episodes of chest pain.  He was seen in the ER on 03/04/2019 with a similar episode and was admitted.  He was discharged by Dr. Ladona Ridgel with heart care who also placed his defibrillator.  The patient cardiologist is with the [facility] in [place].  He has an appointment with him tomorrow.  He states that he has been taking his amiodarone as prescribed from the last discharge of the hospital.  He denies any fevers, abdominal pain, back pain, vomiting, leg swelling.    Past Medical History:  Diagnosis Date  . Arthritis    "elbows, knees" (02/24/2016)  . Bronchitis 2006  . CHF (congestive heart failure) (HCC)   . COPD (chronic obstructive pulmonary  disease) (HCC)   . Coronary artery disease   . GERD (gastroesophageal reflux disease)   . High cholesterol   . Myocardial infarction [facility]) ~ 1995 X 2;1998; 2000; 2004  . On home oxygen therapy    "2L w/activity" (02/24/2016)  . Pacemaker   . Pulmonary embolism (HCC) 02/24/2016  . Sleep apnea    "have CPAP; can't tolerate it" (02/24/2016)    Patient Active Problem List   Diagnosis Date Noted  . Arrhythmia 03/12/2019  . On home O2   . Acute on chronic systolic CHF (congestive heart failure) (HCC) 06/22/2018  . Chronic respiratory failure with hypoxia (HCC) 06/22/2018  . Congestive heart failure (CHF) (HCC) 06/22/2018  . DCM (dilated cardiomyopathy) (HCC) 05/25/2017  . Coronary artery disease involving native coronary artery of native heart without angina pectoris 05/25/2017  . Chronic atrial fibrillation 05/25/2017  . COPD (chronic obstructive pulmonary disease) (HCC) 05/25/2017  . CKD (chronic kidney disease) 05/25/2017  . Hypothyroidism 05/25/2017  . 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 OSA (obstructive sleep apnea) 05/25/2017  . Pulmonary HTN (HCC) 05/25/2017  . Acute on chronic respiratory failure with hypoxia (HCC)   . Mediastinal adenopathy   . Cervical adenopathy   . Acute respiratory failure with hypoxia (HCC) 02/24/2016  . Pulmonary embolus (HCC) 02/24/2016  . Pulmonary artery thrombosis (HCC)   . Pleural effusion   .  Hypoxemia   . Lung mass   . Pleural plaque   . Flutter-fibrillation (HCC) 01/15/2016  . Acute systolic congestive heart failure (HCC)   . Atrial fibrillation with RVR (HCC)   . Hypercholesterolemia 03/11/2006  . TOBACCO DEPENDENCE 03/11/2006  . Acute on chronic systolic congestive heart failure (HCC) 03/11/2006  . GASTROESOPHAGEAL REFLUX, NO ESOPHAGITIS 03/11/2006  . OSTEOARTHRITIS, MULTI SITES 03/11/2006  . HIGH RISK PATIENT 03/11/2006  . Tobacco dependence 03/11/2006    Past Surgical History:  Procedure Laterality  Date  . CORONARY ANGIOPLASTY  1995  . CORONARY ANGIOPLASTY WITH STENT PLACEMENT  ~ 1995 - 2004 X 5   "I've got a total of 5 stents" (02/24/2016)  . 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 PACEMAKER [place]ATOR CH[facility]  03/[facility] [place]  . TONSILLECTOMY         Family History  Problem Relation Age of Onset  . Heart attack Mother   . Heart attack Father   . Heart attack Sister 79    Social History   Tobacco Use  . Smoking status: Current Some Day Smoker    Packs/day: 3.00    Years: 35.00    Pack years: 105.00    Types: Cigarettes  . Smokeless tobacco: Never Used  . Tobacco comment: 02/24/2016 "stopped ~ 8-9 months ago; have had 5 cigarettes in the last week"  Substance Use Topics  . Alcohol use: Yes    Comment: 02/24/2016 "none in a long time"  . Drug use: Not Currently    Types: Cocaine    Comment: 02/24/2016 "none since 1995"    Home Medications Prior to Admission medications   Medication Sig Start Date End Date Taking? Authorizing Provider  acetaminophen (TYLENOL) 325 MG tablet Take 2 tablets (650 mg total) by mouth every 6 (six) hours as needed for mild pain (or Fever >/= 101). 03/05/16  Yes Alison Murray, MD  apixaban (ELIQUIS) 5 MG TABS tablet Take 5 mg by mouth 2 (two) times daily.   Yes [provider]  carboxymethylcellulose (REFRESH PLUS) 0.5 % SOLN Place 1 drop into both eyes at bedtime.   Yes [provider]  Cholecalciferol (VITAMIN D3) 50 MCG (2000 UT) TABS Take 2,000 Units by mouth daily.   Yes [provider]  furosemide (LASIX) 40 MG tablet Take 1 tablet (40 mg total) by mouth daily. If lower extremity edema take 1 additional tablet Patient taking differently: Take 40 mg by mouth daily.  06/23/18 06/23/19 Yes Black, Lesle Chris, NP  levalbuterol [facility] HFA) 45 MCG/ACT inhaler Inhale 2 puffs into the lungs every 6 (six) hours as needed for wheezing or shortness of breath.   Yes  [provider]  levothyroxine (SYNTHROID) 75 MCG tablet Take 75 mcg by mouth daily before breakfast.   Yes [provider]  losartan (COZAAR) 25 MG tablet Take 12.5 mg by mouth daily.   Yes [provider]  metoprolol succinate (TOPROL-XL) 50 MG 24 hr tablet Take 75 mg by mouth 2 (two) times daily.    Yes [provider]  montelukast (SINGULAIR) 10 MG tablet Take 10 mg by mouth 2 (two) times daily.    Yes [provider]  Multiple Vitamin (MULTIVITAMIN WITH MINERALS) TABS tablet Take 1 tablet by mouth daily.   Yes [provider]  nitroGLYCERIN (NITROSTAT) 0.4 MG SL tablet Place 0.4 mg under the tongue every 5 (  five) minutes as needed for chest pain.    Yes [provider]  omega-3 acid ethyl esters (LOVAZA) 1 g capsule Take 1 capsule (1 g total) by mouth 2 (two) times daily. 03/05/16  Yes Alison Murray, MD  omeprazole (PRILOSEC) 20 MG capsule Take 20 mg by mouth 2 (two) times daily before a meal.   Yes [provider]  Propylene Glycol (SYSTANE BALANCE) 0.6 % SOLN Place 1 drop into both eyes See admin instructions. Place 1 drop into each eye four times a day and apply a warm compress for 10 minutes afterwards   Yes [provider]  rosuvastatin (CRESTOR) 40 MG tablet Take 20 mg by mouth at bedtime.    Yes [provider]  spironolactone (ALDACTONE) 25 MG tablet Take 1 tablet (25 mg total) by mouth daily. 03/06/16  Yes Graciella Freer, PA-C  Tiotropium Bromide-Olodaterol (STIOLTO RESPIMAT) 2.5-2.5 MCG/ACT AERS Inhale 2 puffs into the lungs daily.    Yes [provider]  amiodarone (PACERONE) 400 MG tablet Take 1 tablet (400 mg total) by mouth 2 (two) times daily. 05/10/19   Mare Ferrari, PA-C  mometasone (ASMANEX) 220 MCG/INH inhaler Inhale 2 puffs into the lungs 2 (two) times daily.     [provider]    Allergies    Crestor [rosuvastatin calcium]  Review of Systems   Review of  Systems  Constitutional: Negative for chills and fever.  HENT: Negative for ear pain and sore throat.   Eyes: Negative for pain and visual disturbance.  Respiratory: Positive for chest tightness and shortness of breath. Negative for cough.   Cardiovascular: Positive for chest pain. Negative for palpitations and leg swelling.  Gastrointestinal: Positive for nausea. Negative for abdominal pain and vomiting.  Genitourinary: Negative for dysuria and hematuria.  Musculoskeletal: Negative for arthralgias and back pain.  Skin: Negative for color change and rash.  Neurological: Positive for dizziness and light-headedness. Negative for seizures, syncope and weakness.  Psychiatric/Behavioral: Negative for confusion.  All other systems reviewed and are negative.   Physical Exam Updated Vital Signs BP 117/68   Pulse 60   Temp 97.7 F (36.5 C) (Oral)   Resp (!) 22   SpO2 95%   Physical Exam Vitals reviewed.  Constitutional:      General: He is not in acute distress.    Appearance: Normal appearance. He is obese. He is not ill-appearing, toxic-appearing or diaphoretic.  HENT:     Head: Normocephalic.  Eyes:     Extraocular Movements: Extraocular movements intact.     Pupils: Pupils are equal, round, and reactive to light.  Cardiovascular:     Rate and Rhythm: Normal rate and regular rhythm.     Pulses: Normal pulses.          Radial pulses are 2+ on the right side and 2+ on the left side.       Dorsalis pedis pulses are 2+ on the right side and 2+ on the left side.     Heart sounds: Normal heart sounds, S1 normal and S2 normal. No murmur. No friction rub.  Pulmonary:     Effort: Pulmonary effort is normal.     Breath sounds: Normal breath sounds.  Abdominal:     General: Abdomen is flat.     Palpations: Abdomen is soft.  Musculoskeletal:        General: Normal range of motion.     Cervical back: Normal range of motion and neck supple.  Right lower leg: No tenderness. No edema.      Left lower leg: No tenderness. No edema.  Neurological:     General: No focal deficit present.     Mental Status: He is alert and oriented to person, place, and time.     Cranial Nerves: No cranial nerve deficit.     Motor: No weakness.  Psychiatric:        Mood and Affect: Mood normal.        Behavior: Behavior normal.     ED Results / Procedures / Treatments   Labs (all labs ordered are listed, but only abnormal results are displayed) Labs Reviewed  BASIC METABOLIC PANEL - Abnormal; Notable for the following components:      Result Value   Glucose, Bld 116 (*)    Creatinine, Ser 1.42 (*)    GFR calc non Af Amer 51 (*)    GFR calc Af Amer 59 (*)    All other components within normal limits  TROPONIN I (HIGH SENSITIVITY) - Abnormal; Notable for the following components:   Troponin I (High Sensitivity) 21 (*)    All other components within normal limits  TROPONIN I (HIGH SENSITIVITY) - Abnormal; Notable for the following components:   Troponin I (High Sensitivity) 18 (*)    All other components within normal limits  CBC    EKG EKG Interpretation  Date/Time:  Wednesday May 10 2019 11:21:14 EDT Ventricular Rate:  60 PR Interval:    QRS Duration: 131 QT Interval:  486 QTC Calculation: 486 R Axis:   -12 Text Interpretation: Sinus rhythm Nonspecific intraventricular conduction delay no sig change from old Confirmed by Charlesetta Shanks [phone]) on 05/10/2019 11:56:06 AM   Radiology DG Chest 2 View  Result Date: 05/10/2019 CLINICAL DATA:  Chest pain and shortness of breath EXAM: CHEST - 2 VIEW COMPARISON:  March 12, 2019 FINDINGS: There is underlying emphysematous change, better delineated on prior CT examination. There is right base pleural thickening. Calcification in this area is noted but better seen on CT, indicative of previous asbestos exposure. There is also calcification along the anterior pleura, similar to prior CT. There is scarring in the right base region.  There is slight bibasilar atelectasis as well. No airspace consolidation. Heart size is normal. Pulmonary vascularity reflects underlying emphysematous change and is stable. Specifically, there is decreased vascularity to the upper lobes in areas of apparent bullous disease. Pacemaker leads are attached to the right atrium and right ventricle. No demonstrable adenopathy. No bone lesions. IMPRESSION: Underlying emphysematous change. Areas of pleural thickening and scarring with calcification, indicative of prior asbestos exposure. Areas of scarring and atelectatic change. No edema or consolidation. Heart size within normal limits. Pacemaker leads attached to right atrium and right ventricle. No adenopathy appreciable. Appearance similar to prior studies. Electronically Signed   By: Lowella Grip III M.D.   On: 05/10/2019 13:55    Procedures Procedures (including critical care time)  Medications Ordered in ED Medications  sodium chloride flush (NS) 0.9 % injection 3 mL (has no administration in time range)    ED Course  I have reviewed the triage vital signs and the nursing notes.  Pertinent labs & imaging results that were available during my care of the patient were reviewed by me and considered in my medical decision making (see chart for details).  Clinical Course as of May 10 1622  Wed May 10, 2019  1242 [employer] called  Lead turns are stable Device functioning as  programmed 3 episodes: 2 of them were at 20:20 and 20:21 April 27th; Vtac that accelerated  into VFIB at 20:21 lasting 30 seconds,  pacemaker tried to pace him out of it 3 times, shocked him with a 35j shock, returned to normal.  April 19th- fast ventricular rhythm of 194, tried to pace him out 4 times, shocked him with 35j  at 02:40.    [MB]    Clinical Course User Index [MB] Leone Brand   MDM Rules/Calculators/A&P                     69 year old male with pacemaker/AICD with defib shock last night and chest  pain that relieved by nitro this morning On presentation to the ER, patient is alert and oriented, no acute distress, nondiaphoretic, nontoxic-appearing, not tachypneic, with no increased work of breathing resting comfortably in ER bed.  Vitals reassuring, heart rate on presentation here around 58, his baseline appears to be in the 60s.  BMP without significant electrolyte abnormalities, creatinine today 1.42, chart review from 1 month ago shows values of 1.23.  CBC without leukocytosis, normal hemoglobin.  Initial troponin XX 1, that this appears to be lower than his previous initial troponin at admission approximately 1 month ago which was 72.  Chest x-ray without acute cardiopulmonary abnormalities, with chronic evidence of asbestos exposure.  We will continue to trend.  Heart score of 6.  Pacemaker interrogation performed, please see clinical course for conversation with [employer].  Evidence of 3 episodes of V. Tach in the last several weeks.  Consulted [facility] as they have been managing the patient with his previous admissions, they will come see to discuss discharge versus admission.  Patient was seen by Collene Schlichter and Dr. Elberta Fortis.  Upon further discussion, they have recommended to increase the patient's amiodarone to 400 mg twice daily with strict follow-up with his cardiologist tomorrow which is very reasonable.  Troponins downtrending.   Patient has had no other recurring episodes of chest pain/shocks in the ER and is overall feeling well.   He voices understanding and is agreeable to this plan.  At this stage in the ED course, the patient is has been adequately screened and is stable for discharge.  Strict return precautions given.  The patient was seen and evaluated by Dr. Clarice Pole and she is agreeable to the above plan. Final Clinical Impression(s) / ED Diagnoses Final diagnoses:  Chest pain, unspecified type    Rx / DC Orders ED Discharge Orders         Ordered    amiodarone (PACERONE)  400 MG tablet  2 times daily     05/10/19 1620           Leone Brand 05/10/19 1628    Arby Barrette, MD 05/12/19 1259

## 2019-05-10 NOTE — ED Notes (Signed)
Pt transported to xray 

## 2019-05-10 NOTE — Discharge Instructions (Addendum)
Please take your new dose of Amiodarone twice daily. It is extremely important that you keep your cardiology appointment tomorrow. Please return to the ER if your symptoms worsen.

## 2019-05-11 ENCOUNTER — Encounter (HOSPITAL_COMMUNITY): Payer: Self-pay | Admitting: Emergency Medicine

## 2019-05-11 ENCOUNTER — Emergency Department (HOSPITAL_COMMUNITY): Payer: No Typology Code available for payment source

## 2019-05-11 ENCOUNTER — Other Ambulatory Visit: Payer: Self-pay

## 2019-05-11 ENCOUNTER — Inpatient Hospital Stay (HOSPITAL_COMMUNITY)
Admission: EM | Admit: 2019-05-11 | Discharge: 2019-05-15 | DRG: 287 | Disposition: A | Discharge status: Home / Self Care | Payer: No Typology Code available for payment source | Attending: Internal Medicine | Admitting: Internal Medicine

## 2019-05-11 DIAGNOSIS — Z720 Tobacco use: Secondary | ICD-10-CM

## 2019-05-11 DIAGNOSIS — I2510 Atherosclerotic heart disease of native coronary artery without angina pectoris: Secondary | ICD-10-CM

## 2019-05-11 DIAGNOSIS — N183 Chronic kidney disease, stage 3 unspecified: Secondary | ICD-10-CM | POA: Diagnosis present

## 2019-05-11 DIAGNOSIS — I4819 Other persistent atrial fibrillation: Secondary | ICD-10-CM | POA: Diagnosis present

## 2019-05-11 DIAGNOSIS — I482 Chronic atrial fibrillation, unspecified: Secondary | ICD-10-CM | POA: Diagnosis present

## 2019-05-11 DIAGNOSIS — Z955 Presence of coronary angioplasty implant and graft: Secondary | ICD-10-CM

## 2019-05-11 DIAGNOSIS — R079 Chest pain, unspecified: Secondary | ICD-10-CM

## 2019-05-11 DIAGNOSIS — I252 Old myocardial infarction: Secondary | ICD-10-CM

## 2019-05-11 DIAGNOSIS — I420 Dilated cardiomyopathy: Secondary | ICD-10-CM | POA: Diagnosis present

## 2019-05-11 DIAGNOSIS — F17210 Nicotine dependence, cigarettes, uncomplicated: Secondary | ICD-10-CM | POA: Diagnosis present

## 2019-05-11 DIAGNOSIS — I472 Ventricular tachycardia, unspecified: Secondary | ICD-10-CM

## 2019-05-11 DIAGNOSIS — G4733 Obstructive sleep apnea (adult) (pediatric): Secondary | ICD-10-CM | POA: Diagnosis present

## 2019-05-11 DIAGNOSIS — E039 Hypothyroidism, unspecified: Secondary | ICD-10-CM | POA: Diagnosis present

## 2019-05-11 DIAGNOSIS — Z20822 Contact with and (suspected) exposure to covid-19: Secondary | ICD-10-CM | POA: Diagnosis present

## 2019-05-11 DIAGNOSIS — E7800 Pure hypercholesterolemia, unspecified: Secondary | ICD-10-CM | POA: Diagnosis present

## 2019-05-11 DIAGNOSIS — Z79890 Hormone replacement therapy (postmenopausal): Secondary | ICD-10-CM

## 2019-05-11 DIAGNOSIS — Y831 Surgical operation with implant of artificial internal device as the cause of abnormal reaction of the patient, or of later complication, without mention of misadventure at the time of the procedure: Secondary | ICD-10-CM | POA: Diagnosis present

## 2019-05-11 DIAGNOSIS — I5042 Chronic combined systolic (congestive) and diastolic (congestive) heart failure: Secondary | ICD-10-CM | POA: Diagnosis present

## 2019-05-11 DIAGNOSIS — J449 Chronic obstructive pulmonary disease, unspecified: Secondary | ICD-10-CM | POA: Diagnosis present

## 2019-05-11 DIAGNOSIS — I5022 Chronic systolic (congestive) heart failure: Secondary | ICD-10-CM | POA: Diagnosis present

## 2019-05-11 DIAGNOSIS — Z79899 Other long term (current) drug therapy: Secondary | ICD-10-CM

## 2019-05-11 DIAGNOSIS — K219 Gastro-esophageal reflux disease without esophagitis: Secondary | ICD-10-CM | POA: Diagnosis present

## 2019-05-11 DIAGNOSIS — I25118 Atherosclerotic heart disease of native coronary artery with other forms of angina pectoris: Secondary | ICD-10-CM | POA: Diagnosis present

## 2019-05-11 DIAGNOSIS — T82855A Stenosis of coronary artery stent, initial encounter: Secondary | ICD-10-CM | POA: Diagnosis present

## 2019-05-11 DIAGNOSIS — Z9981 Dependence on supplemental oxygen: Secondary | ICD-10-CM

## 2019-05-11 DIAGNOSIS — Z7901 Long term (current) use of anticoagulants: Secondary | ICD-10-CM

## 2019-05-11 DIAGNOSIS — Z8249 Family history of ischemic heart disease and other diseases of the circulatory system: Secondary | ICD-10-CM

## 2019-05-11 DIAGNOSIS — I255 Ischemic cardiomyopathy: Secondary | ICD-10-CM | POA: Diagnosis present

## 2019-05-11 DIAGNOSIS — I4811 Longstanding persistent atrial fibrillation: Secondary | ICD-10-CM | POA: Diagnosis present

## 2019-05-11 DIAGNOSIS — Z86711 Personal history of pulmonary embolism: Secondary | ICD-10-CM

## 2019-05-11 DIAGNOSIS — I5023 Acute on chronic systolic (congestive) heart failure: Secondary | ICD-10-CM | POA: Diagnosis present

## 2019-05-11 DIAGNOSIS — I272 Pulmonary hypertension, unspecified: Secondary | ICD-10-CM | POA: Diagnosis present

## 2019-05-11 DIAGNOSIS — I4901 Ventricular fibrillation: Secondary | ICD-10-CM | POA: Diagnosis present

## 2019-05-11 DIAGNOSIS — Z4502 Encounter for adjustment and management of automatic implantable cardiac defibrillator: Secondary | ICD-10-CM

## 2019-05-11 DIAGNOSIS — Z888 Allergy status to other drugs, medicaments and biological substances status: Secondary | ICD-10-CM

## 2019-05-11 DIAGNOSIS — M159 Polyosteoarthritis, unspecified: Secondary | ICD-10-CM | POA: Diagnosis present

## 2019-05-11 DIAGNOSIS — Z95810 Presence of automatic (implantable) cardiac defibrillator: Secondary | ICD-10-CM | POA: Diagnosis present

## 2019-05-11 DIAGNOSIS — E785 Hyperlipidemia, unspecified: Secondary | ICD-10-CM | POA: Diagnosis present

## 2019-05-11 LAB — BASIC METABOLIC PANEL
Anion gap: 10 (ref 5–15)
BUN: 15 mg/dL (ref 8–23)
CO2: 31 mmol/L (ref 22–32)
Calcium: 9.2 mg/dL (ref 8.9–10.3)
Chloride: 98 mmol/L (ref 98–111)
Creatinine, Ser: 1.47 mg/dL — ABNORMAL HIGH (ref 0.61–1.24)
GFR calc Af Amer: 56 mL/min — ABNORMAL LOW (ref >60)
GFR calc non Af Amer: 49 mL/min — ABNORMAL LOW (ref >60)
Glucose, Bld: 120 mg/dL — ABNORMAL HIGH (ref 70–99)
Potassium: 4.2 mmol/L (ref 3.5–5.1)
Sodium: 139 mmol/L (ref 135–145)

## 2019-05-11 LAB — CBC
HCT: 54.3 % — ABNORMAL HIGH (ref 39.0–52.0)
Hemoglobin: 17.7 g/dL — ABNORMAL HIGH (ref 13.0–17.0)
MCH: 30.7 pg (ref 26.0–34.0)
MCHC: 32.6 g/dL (ref 30.0–36.0)
MCV: 94.3 fL (ref 80.0–100.0)
Platelets: 145 10*3/uL — ABNORMAL LOW (ref 150–400)
RBC: 5.76 MIL/uL (ref 4.22–5.81)
RDW: 14.8 % (ref 11.5–15.5)
WBC: 9.1 10*3/uL (ref 4.0–10.5)
nRBC: 0 % (ref 0.0–0.2)

## 2019-05-11 LAB — RESPIRATORY PANEL BY RT PCR (FLU A&B, COVID)
Influenza A by PCR: NEGATIVE
Influenza B by PCR: NEGATIVE
SARS Coronavirus 2 by RT PCR: NEGATIVE

## 2019-05-11 LAB — TROPONIN I (HIGH SENSITIVITY)
Troponin I (High Sensitivity): 14 ng/L (ref <18)
Troponin I (High Sensitivity): 14 ng/L (ref <18)

## 2019-05-11 MED ORDER — LEVOTHYROXINE SODIUM 75 MCG PO TABS
75.0000 ug | ORAL_TABLET | Freq: Every day | ORAL | Status: DC
  Administered 2019-05-12 – 2019-05-15 (×4): 75 ug via ORAL
  Filled 2019-05-11 (×4): qty 1

## 2019-05-11 MED ORDER — MONTELUKAST SODIUM 10 MG PO TABS
10.0000 mg | ORAL_TABLET | Freq: Two times a day (BID) | ORAL | Status: DC
  Administered 2019-05-12 – 2019-05-15 (×7): 10 mg via ORAL
  Filled 2019-05-11 (×7): qty 1

## 2019-05-11 MED ORDER — BUDESONIDE 0.5 MG/2ML IN SUSP
0.5000 mg | Freq: Two times a day (BID) | RESPIRATORY_TRACT | Status: DC
  Administered 2019-05-12 – 2019-05-15 (×7): 0.5 mg via RESPIRATORY_TRACT
  Filled 2019-05-11 (×8): qty 2

## 2019-05-11 MED ORDER — SPIRONOLACTONE 25 MG PO TABS
25.0000 mg | ORAL_TABLET | Freq: Every day | ORAL | Status: DC
  Administered 2019-05-12: 25 mg via ORAL
  Filled 2019-05-11: qty 1

## 2019-05-11 MED ORDER — OMEGA-3-ACID ETHYL ESTERS 1 G PO CAPS
1.0000 g | ORAL_CAPSULE | Freq: Two times a day (BID) | ORAL | Status: DC
  Administered 2019-05-12 – 2019-05-15 (×7): 1 g via ORAL
  Filled 2019-05-11 (×7): qty 1

## 2019-05-11 MED ORDER — NITROGLYCERIN 0.4 MG SL SUBL: 0.4000 mg | SUBLINGUAL_TABLET | SUBLINGUAL | Status: DC | PRN

## 2019-05-11 MED ORDER — ACETAMINOPHEN 325 MG PO TABS: 650.0000 mg | ORAL_TABLET | ORAL | Status: DC | PRN

## 2019-05-11 MED ORDER — SODIUM CHLORIDE 0.9% FLUSH: 3.0000 mL | INTRAVENOUS | Status: DC | PRN

## 2019-05-11 MED ORDER — SODIUM CHLORIDE 0.9 % IV SOLN: 250.0000 mL | INTRAVENOUS | Status: DC | PRN

## 2019-05-11 MED ORDER — UMECLIDINIUM BROMIDE 62.5 MCG/INH IN AEPB
1.0000 | INHALATION_SPRAY | Freq: Every day | RESPIRATORY_TRACT | Status: DC
  Administered 2019-05-12 – 2019-05-15 (×4): 1 via RESPIRATORY_TRACT
  Filled 2019-05-11: qty 7

## 2019-05-11 MED ORDER — PANTOPRAZOLE SODIUM 40 MG PO TBEC
40.0000 mg | DELAYED_RELEASE_TABLET | Freq: Every day | ORAL | Status: DC
  Administered 2019-05-12 – 2019-05-15 (×4): 40 mg via ORAL
  Filled 2019-05-11 (×4): qty 1

## 2019-05-11 MED ORDER — ARFORMOTEROL TARTRATE 15 MCG/2ML IN NEBU
15.0000 ug | INHALATION_SOLUTION | Freq: Two times a day (BID) | RESPIRATORY_TRACT | Status: DC
  Administered 2019-05-12 – 2019-05-15 (×7): 15 ug via RESPIRATORY_TRACT
  Filled 2019-05-11 (×8): qty 2

## 2019-05-11 MED ORDER — AMIODARONE HCL 200 MG PO TABS
400.0000 mg | ORAL_TABLET | Freq: Two times a day (BID) | ORAL | Status: DC
  Administered 2019-05-12 – 2019-05-15 (×7): 400 mg via ORAL
  Filled 2019-05-11 (×7): qty 2

## 2019-05-11 MED ORDER — ASPIRIN EC 81 MG PO TBEC: 81.0000 mg | DELAYED_RELEASE_TABLET | Freq: Every day | ORAL | Status: DC

## 2019-05-11 MED ORDER — POLYVINYL ALCOHOL 1.4 % OP SOLN
1.0000 [drp] | Freq: Four times a day (QID) | OPHTHALMIC | Status: DC
  Administered 2019-05-12 – 2019-05-15 (×14): 1 [drp] via OPHTHALMIC
  Filled 2019-05-11: qty 15

## 2019-05-11 MED ORDER — ROSUVASTATIN CALCIUM 20 MG PO TABS: 20.0000 mg | ORAL_TABLET | Freq: Every day | ORAL | Status: DC

## 2019-05-11 MED ORDER — SODIUM CHLORIDE 0.9% FLUSH
3.0000 mL | Freq: Two times a day (BID) | INTRAVENOUS | Status: DC
  Administered 2019-05-12 – 2019-05-13 (×2): 3 mL via INTRAVENOUS

## 2019-05-11 MED ORDER — LEVALBUTEROL HCL 0.63 MG/3ML IN NEBU: 0.6300 mg | INHALATION_SOLUTION | Freq: Four times a day (QID) | RESPIRATORY_TRACT | Status: DC | PRN

## 2019-05-11 MED ORDER — METOPROLOL SUCCINATE ER 50 MG PO TB24
75.0000 mg | ORAL_TABLET | Freq: Two times a day (BID) | ORAL | Status: DC
  Administered 2019-05-12 – 2019-05-13 (×2): 75 mg via ORAL
  Filled 2019-05-11 (×4): qty 1

## 2019-05-11 MED ORDER — POLYVINYL ALCOHOL 1.4 % OP SOLN
1.0000 [drp] | Freq: Every day | OPHTHALMIC | Status: DC
  Filled 2019-05-11: qty 15

## 2019-05-11 NOTE — ED Provider Notes (Addendum)
[place] EMERGENCY DEPARTMENT Provider Note   CSN: [id number] Arrival date & time: 05/11/19  1042     History Chief Complaint  Patient presents with  . AICD Problem    Max Rivas is a 69 y.o. male.  HPI   69 year old male history of coronary artery disease, status post multiple prior MRIs with last heart cath 2004, status post AICD, history of COPD, CHF, hyperlipidemia, A. fib, with multiple recent episodes of symptomatic V. tach with ICD shock who returns to the ED today at the advice of his cardiologist.  Patient reports that he was very lightheaded when playing pool with friends 2 nights ago and then yesterday when walking to the store.  Both times he is defibrillator fired.  Patient has also been having some intermittent chest pain.  Patient reports that he has had no new episodes since discharge yesterday.  Yesterday, per ED report he was noted to have 3 device episodes that correlated with when he had his symptoms.  These revealed V. tach but accelerated into V. fib and lasted approximately 30 seconds.  Pacemaker attempted to pace and then shocked him with 35 J and he returned to a sinus rhythm.  He was shocked at 2 during 2 different episodes on the 27th and on April 19.  He was seen in consultation by cardiology yesterday and his amiodarone was increased with the plan was for the patient to follow-up with his cardiologist today at the [facility].  He was seen at the [facility] and his cardiologist sent him back to [facility] via ambulance.    Past Medical History:  Diagnosis Date  . Arthritis    "elbows, knees" (02/24/2016)  . Bronchitis 2006  . CHF (congestive heart failure) ([place])   . COPD (chronic obstructive pulmonary disease) ([place])   . Coronary artery disease   . GERD (gastroesophageal reflux disease)   . High cholesterol   . Myocardial infarction [facility]) ~ 1995 X 2;1998; 2000; 2004  . On home oxygen therapy    "2L w/activity" (02/24/2016)   . Pacemaker   . Pulmonary embolism ([place]) 02/24/2016  . Sleep apnea    "have CPAP; can't tolerate it" (02/24/2016)    Patient Active Problem List   Diagnosis Date Noted  . Arrhythmia 03/12/2019  . On home O2   . Acute on chronic systolic CHF (congestive heart failure) ([place]) 06/22/2018  . Chronic respiratory failure with hypoxia ([place]) 06/22/2018  . Congestive heart failure (CHF) ([place]) 06/22/2018  . DCM (dilated cardiomyopathy) ([place]) 05/25/2017  . Coronary artery disease involving native coronary artery of native heart without angina pectoris 05/25/2017  . Chronic atrial fibrillation 05/25/2017  . COPD (chronic obstructive pulmonary disease) ([place]) 05/25/2017  . CKD (chronic kidney disease) 05/25/2017  . Hypothyroidism 05/25/2017  . 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 OSA (obstructive sleep apnea) 05/25/2017  . Pulmonary HTN ([place]) 05/25/2017  . Acute on chronic respiratory failure with hypoxia ([place])   . Mediastinal adenopathy   . Cervical adenopathy   . Acute respiratory failure with hypoxia ([place]) 02/24/2016  . Pulmonary embolus ([place]) 02/24/2016  . 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 Pleural effusion   . Hypoxemia   . Lung mass   . Pleural plaque   . Flutter-fibrillation ([place]) 01/15/2016  . Acute systolic congestive heart failure ([place])   . Atrial fibrillation with RVR ([place])   . Hypercholesterolemia 03/11/2006  . TOBACCO DEPENDENCE 03/11/2006  . Acute on chronic systolic congestive  heart failure ([facility]) 03/11/2006  . GASTROESOPHAGEAL REFLUX, NO ESOPHAGITIS 03/11/2006  . OSTEOARTHRITIS, MULTI SITES 03/11/2006  . HIGH RISK PATIENT 03/11/2006  . Tobacco dependence 03/11/2006    Past Surgical History:  Procedure Laterality Date  . CORONARY ANGIOPLASTY  1995  . CORONARY ANGIOPLASTY WITH STENT PLACEMENT  ~ 1995 - 2004 X 5   "I've got a total of 5 stents" (02/24/2016)  . INSERT / REPLACE / REMOVE PACEMAKER  07/2003   original PPM around 2004 for  ICM with EF < 35%  . PACEMAKER GENERATOR CHANGE  03/2011    [facility]  . TONSILLECTOMY         Family History  Problem Relation Age of Onset  . Heart attack Mother   . Heart attack Father   . Heart attack Sister 52    Social History   Tobacco Use  . Smoking status: Current Some Day Smoker    Packs/day: 3.00    Years: 35.00    Pack years: 105.00    Types: Cigarettes  . Smokeless tobacco: Never Used  . Tobacco comment: 02/24/2016 "stopped ~ 8-9 months ago; have had 5 cigarettes in the last week"  Substance Use Topics  . Alcohol use: Yes    Comment: 02/24/2016 "none in a long time"  . Drug use: Not Currently    Types: Cocaine    Comment: 02/24/2016 "none since 1995"    Home Medications Prior to Admission medications   Medication Sig Start Date End Date Taking? Authorizing Provider  acetaminophen (TYLENOL) 325 MG tablet Take 2 tablets (650 mg total) by mouth every 6 (six) hours as needed for mild pain (or Fever >/= 101). 03/05/16   Alison Murray, MD  amiodarone (PACERONE) 400 MG tablet Take 1 tablet (400 mg total) by mouth 2 (two) times daily. 05/10/19   Mare Ferrari, PA-C  apixaban (ELIQUIS) 5 MG TABS tablet Take 5 mg by mouth 2 (two) times daily.    [provider]  carboxymethylcellulose (REFRESH PLUS) 0.5 % SOLN Place 1 drop into both eyes at bedtime.    [provider]  Cholecalciferol (VITAMIN D3) 50 MCG (2000 UT) TABS Take 2,000 Units by mouth daily.    [provider]  furosemide (LASIX) 40 MG tablet Take 1 tablet (40 mg total) by mouth daily. If lower extremity edema take 1 additional tablet Patient taking differently: Take 40 mg by mouth daily.  06/23/18 06/23/19  Gwenyth Bender, NP  levalbuterol [facility] HFA) 45 MCG/ACT inhaler Inhale 2 puffs into the lungs every 6 (six) hours as needed for wheezing or shortness of breath.    [provider]  levothyroxine (SYNTHROID) 75 MCG tablet Take 75 mcg by mouth daily before breakfast.     [provider]  losartan (COZAAR) 25 MG tablet Take 12.5 mg by mouth daily.    [provider]  metoprolol succinate (TOPROL-XL) 50 MG 24 hr tablet Take 75 mg by mouth 2 (two) times daily.     [provider]  mometasone [facility]) 220 MCG/INH inhaler Inhale 2 puffs into the lungs 2 (two) times daily.     [provider]  montelukast (SINGULAIR) 10 MG tablet Take 10 mg by mouth 2 (two) times daily.     [provider]  Multiple Vitamin (MULTIVITAMIN WITH MINERALS) TABS tablet Take 1 tablet by mouth daily.    [provider]  nitroGLYCERIN (NITROSTAT) 0.4 MG SL tablet Place 0.4 mg under the tongue every 5 (five) minutes  as needed for chest pain.     [provider]  omega-3 acid ethyl esters (LOVAZA) 1 g capsule Take 1 capsule (1 g total) by mouth 2 (two) times daily. 03/05/16   Alison Murray, MD  omeprazole (PRILOSEC) 20 MG capsule Take 20 mg by mouth 2 (two) times daily before a meal.    [provider]  Propylene Glycol (SYSTANE BALANCE) 0.6 % SOLN Place 1 drop into both eyes See admin instructions. Place 1 drop into each eye four times a day and apply a warm compress for 10 minutes afterwards    [provider]  rosuvastatin (CRESTOR) 40 MG tablet Take 20 mg by mouth at bedtime.     [provider]  spironolactone (ALDACTONE) 25 MG tablet Take 1 tablet (25 mg total) by mouth daily. 03/06/16   Graciella Freer, PA-C  Tiotropium Bromide-Olodaterol (STIOLTO RESPIMAT) 2.5-2.5 MCG/ACT AERS Inhale 2 puffs into the lungs daily.     [provider]    Allergies    Crestor [rosuvastatin calcium]  Review of Systems   Review of Systems  All other systems reviewed and are negative.   Physical Exam Updated Vital Signs BP 124/70   Pulse 75   Temp 98.1 F (36.7 C) (Oral)   Resp 20   Ht 1.803 m (5\' 11" )   Wt 108.4 kg   SpO2 93%   BMI 33.33 kg/m   Physical Exam Vitals and nursing note  reviewed.  Constitutional:      General: He is not in acute distress.    Appearance: Normal appearance. He is not ill-appearing.  HENT:     Head: Normocephalic.     Right Ear: External ear normal.     Left Ear: External ear normal.     Mouth/Throat:     Mouth: Mucous membranes are moist.  Cardiovascular:     Rate and Rhythm: Normal rate and regular rhythm.  Pulmonary:     Effort: Pulmonary effort is normal.     Comments: Decreased breath sounds bilaterally Abdominal:     Palpations: Abdomen is soft.  Musculoskeletal:        General: Swelling present. No tenderness, deformity or signs of injury. Normal range of motion.     Cervical back: Normal range of motion.  Skin:    General: Skin is warm.     Capillary Refill: Capillary refill takes less than 2 seconds.  Neurological:     Mental Status: He is alert.  Psychiatric:        Mood and Affect: Mood normal.        Behavior: Behavior normal.     ED Results / Procedures / Treatments   Labs (all labs ordered are listed, but only abnormal results are displayed) Labs Reviewed  BASIC METABOLIC PANEL - Abnormal; Notable for the following components:      Result Value   Glucose, Bld 120 (*)    Creatinine, Ser 1.47 (*)    GFR calc non Af Amer 49 (*)    GFR calc Af Amer 56 (*)    All other components within normal limits  CBC - Abnormal; Notable for the following components:   Hemoglobin 17.7 (*)    HCT 54.3 (*)    Platelets 145 (*)    All other components within normal limits  TROPONIN I (HIGH SENSITIVITY)  TROPONIN I (HIGH SENSITIVITY)    EKG EKG Interpretation  Date/Time:  Thursday May 11 2019 10:53:11 EDT Ventricular Rate:  78 PR  Interval:  214 QRS Duration: 92 QT Interval:  434 QTC Calculation: 494 R Axis:   48 Text Interpretation: Sinus rhythm with 1st degree A-V block with Premature atrial complexes Low voltage QRS Possible Inferior infarct , age undetermined Abnormal ECG Confirmed by Margarita Grizzle [phone]) on  05/11/2019 2:02:19 PM   Radiology DG Chest 2 View  Result Date: 05/11/2019 CLINICAL DATA:  Chest pain since yesterday. EXAM: CHEST - 2 VIEW COMPARISON:  05/10/2018, 03/12/2019 and 06/21/2018 as well as chest CT 07/25/2018 FINDINGS: Left-sided pacemaker unchanged. Lungs are adequately inflated demonstrate chronic changes over the right mid to lower lung with pleural calcification present. No acute airspace process or effusion. Left lung is clear. Mild stable cardiomegaly. Remainder of the exam is unchanged. IMPRESSION: 1.  No acute findings. 2. Chronic stable findings over the right mid to lower lung including significant pleural calcification. Electronically Signed   By: Elberta Fortis M.D.   On: 05/11/2019 11:25   DG Chest 2 View  Result Date: 05/10/2019 CLINICAL DATA:  Chest pain and shortness of breath EXAM: CHEST - 2 VIEW COMPARISON:  March 12, 2019 FINDINGS: There is underlying emphysematous change, better delineated on prior CT examination. There is right base pleural thickening. Calcification in this area is noted but better seen on CT, indicative of previous asbestos exposure. There is also calcification along the anterior pleura, similar to prior CT. There is scarring in the right base region. There is slight bibasilar atelectasis as well. No airspace consolidation. Heart size is normal. Pulmonary vascularity reflects underlying emphysematous change and is stable. Specifically, there is decreased vascularity to the upper lobes in areas of apparent bullous disease. Pacemaker leads are attached to the right atrium and right ventricle. No demonstrable adenopathy. No bone lesions. IMPRESSION: Underlying emphysematous change. Areas of pleural thickening and scarring with calcification, indicative of prior asbestos exposure. Areas of scarring and atelectatic change. No edema or consolidation. Heart size within normal limits. Pacemaker leads attached to right atrium and right ventricle. No adenopathy  appreciable. Appearance similar to prior studies. Electronically Signed   By: Bretta Bang III M.D.   On: 05/10/2019 13:55    Procedures Procedures (including critical care time)  Medications Ordered in ED Medications - No data to display  ED Course  I have reviewed the triage vital signs and the nursing notes.  Pertinent labs & imaging results that were available during my care of the patient were reviewed by me and considered in my medical decision making (see chart for details). 69 year old man history of COPD, coronary artery disease, multiple recent shocks for V. Tach sent here from [facility] for evaluation by cardiology Discussed with cardiology and will see for evaluation Discussed with Francis Dowse, PA and cardiology will admit   MDM Rules/Calculators/A&P                     Final Clinical Impression(s) / ED Diagnoses Final diagnoses:  Chest pain, unspecified type  Defibrillator discharge    Rx / DC Orders ED Discharge Orders    None       Margarita Grizzle, MD 05/11/19 1525    Margarita Grizzle, MD 05/11/19 1544

## 2019-05-11 NOTE — ED Triage Notes (Signed)
Pt seen here yesterday for CP and defib firing 3 times in last 2 weeks. Pt discharged and to follow-up with cardiologist[facility] and possible [facility]. Denies CP at this time.

## 2019-05-11 NOTE — H&P (Addendum)
Cardiology Admission History and Physical:   Patient ID: Max Rivas MRN: [id number]; DOB: [birth year]   Admission date: 05/11/2019  Primary Care Provider: [name], MD Primary Cardiologist: Chapman Fitch, [place] Max Rivas) (previously Dr. Tamala Julian) Primary Electrophysiologist:  Dr. Calton Dach [facility] (previously Dr. Lovena Le)  Chief Complaint:  ICD shocks, CP  Patient Profile:   Max Rivas is a 69 y.o. male with hx of  CAD, (multiple prior MI's multiple stents, last heart cath was 2004 at [facility].  3 stents were placed at that time), ICM, chronic CHF (systolic), ICD, COPD, HLD, OSA intolerant of CPAP, AFib   Device information MDT single chamber implanted/new RV lead and device 03/19/2011 Initial implant 2005 (RV lead was 6949 lead, is abandoned)   + hx of appropriate therapies AAD amiodarone (on historically) and restarted March 2021  History of Present Illness:   Max Rivas was previously hospitalized here at [facility] 03/12/19 with appropriate ICD shock for VT.  At that time, no symptoms of angina, mild abl HS Trop felt demand in setting of VT/ICD shock.  He had recently been taken of amiodarone a few months prior (it seems 2/2 to ongoing AFib and not felt to be needed).  He was resumed on amiodarone (200mg  BID for 31mo then daily) and discharged 03/13/2019.  Her reported taking his medicines as prescribed, including the amiodarone  He reported 05/02/19 he was shocked had the same exact symptoms while planting tomatoes.  After this is when his cardiologist reached out to him and got today's appt.  he got a shock Tuesday night by his device, was playing pool felt the same as prior with onset of feeling hot, weak, lightheaded and sat down was shocked.  He did not have full LOC but was near that, again he felt immediately better afterwards.   He returned to [facility] yesterday.  Stated he was walking into the store got about 1/2 way to the entrance started more SOB then  usual, feeling weak, a little lightheaded and thought he was going to get shocked.  He walked back to his car did not want to fall/faint in the parking lot and once in his car felt a little central CP.  No radiation, took a s/l NTG with relief and called 911. He has remained CP free.  Discussed possible need for ischemic w/u, though his 2nd HS Trop remained negative and given he had an appt to see his primary cardiology team, his amiodarone increased and discharged from the ER  He saw his cardiology/EP service today, was felt that he needed ischemic w/u and sent back to [facility] via ambulance to be admitted and undergo cath.  He reports a sensation of heart racing as he was walking into the [place] center, mentioning though is quite a long walk.  He denies any further CP, no syncope or near syncope.  He took his AM medicines as usual this morning, including his Eliquis  LABS K+ 4.2 BUN.47 HS Trop 14, 14 WBC 9.1 H/H 17/54 Plts 145  Device transmission from 05/10/2019 Battery status 2.63V (RRT is 2.63V, has not triggered RRT yet) Lead measurements are good OptiVol is well below threshold 05/09/2019: MMVT > ATP> accelerated to another MMVT in VF zone > 34.2J shock successful 05/09/2019  Monitored VT episodes 52 seconds 182bpm 05/01/2019: MMVT > ATP > a different MMVT > 34.3J shock successful   Past Medical History:  Diagnosis Date   Arthritis    "elbows, knees" (02/24/2016)   Bronchitis 2006  CHF (congestive heart failure) (HCC)    COPD (chronic obstructive pulmonary disease) (HCC)    Coronary artery disease    GERD (gastroesophageal reflux disease)    High cholesterol    Myocardial infarction (HCC) ~ 1995 X 2;1998; 2000; 2004   On home oxygen therapy    "2L w/activity" (02/24/2016)   Pacemaker    Pulmonary embolism (HCC) 02/24/2016   Sleep apnea    "have CPAP; can't tolerate it" (02/24/2016)    Past Surgical History:  Procedure Laterality Date   CORONARY ANGIOPLASTY  1995   CORONARY  ANGIOPLASTY WITH STENT PLACEMENT  ~ 1995 - 2004 X 5   "I've got a total of 5 stents" (02/24/2016)   INSERT / REPLACE / REMOVE PACEMAKER  07/2003   original PPM around 2004 for ICM with EF < 35%   PACEMAKER GENERATOR CHANGE  03/[facility]   TONSILLECTOMY       Medications Prior to Admission: Prior to Admission medications   Medication Sig Start Date End Date Taking? Authorizing Provider  acetaminophen (TYLENOL) 325 MG tablet Take 2 tablets (650 mg total) by mouth every 6 (six) hours as needed for mild pain (or Fever >/= 101). 03/05/16   Alison Murray, MD  amiodarone (PACERONE) 400 MG tablet Take 1 tablet (400 mg total) by mouth 2 (two) times daily. 05/10/19   Mare Ferrari, PA-C  apixaban (ELIQUIS) 5 MG TABS tablet Take 5 mg by mouth 2 (two) times daily.    [provider]  carboxymethylcellulose (REFRESH PLUS) 0.5 % SOLN Place 1 drop into both eyes at bedtime.    [provider]  Cholecalciferol (VITAMIN D3) 50 MCG (2000 UT) TABS Take 2,000 Units by mouth daily.    [provider]  furosemide (LASIX) 40 MG tablet Take 1 tablet (40 mg total) by mouth daily. If lower extremity edema take 1 additional tablet Patient taking differently: Take 40 mg by mouth daily.  06/23/18 06/23/19  Gwenyth Bender, NP  levalbuterol [facility] HFA) 45 MCG/ACT inhaler Inhale 2 puffs into the lungs every 6 (six) hours as needed for wheezing or shortness of breath.    [provider]  levothyroxine (SYNTHROID) 75 MCG tablet Take 75 mcg by mouth daily before breakfast.    [provider]  losartan (COZAAR) 25 MG tablet Take 12.5 mg by mouth daily.    [provider]  metoprolol succinate (TOPROL-XL) 50 MG 24 hr tablet Take 75 mg by mouth 2 (two) times daily.     [provider]  mometasone [facility]) 220 MCG/INH inhaler Inhale 2 puffs into the lungs 2 (two) times daily.     [provider]  montelukast (SINGULAIR) 10 MG tablet Take 10 mg by  mouth 2 (two) times daily.     [provider]  Multiple Vitamin (MULTIVITAMIN WITH MINERALS) TABS tablet Take 1 tablet by mouth daily.    [provider]  nitroGLYCERIN (NITROSTAT) 0.4 MG SL tablet Place 0.4 mg under the tongue every 5 (five) minutes as needed for chest pain.     [provider]  omega-3 acid ethyl esters (LOVAZA) 1 g capsule Take 1 capsule (1 g total) by mouth 2 (two) times daily. 03/05/16   Alison Murray, MD  omeprazole (PRILOSEC) 20 MG capsule Take 20 mg by mouth 2 (two) times daily before a meal.    [provider]  Propylene Glycol (SYSTANE BALANCE) 0.6 % SOLN Place 1 drop into both eyes  See admin instructions. Place 1 drop into each eye four times a day and apply a warm compress for 10 minutes afterwards    [provider]  rosuvastatin (CRESTOR) 40 MG tablet Take 20 mg by mouth at bedtime.     [provider]  spironolactone (ALDACTONE) 25 MG tablet Take 1 tablet (25 mg total) by mouth daily. 03/06/16   Graciella Freer, PA-C  Tiotropium Bromide-Olodaterol (STIOLTO RESPIMAT) 2.5-2.5 MCG/ACT AERS Inhale 2 puffs into the lungs daily.     [provider]     Allergies:    Allergies  Allergen Reactions   Crestor [Rosuvastatin Calcium] Other (See Comments)    Back spasms, but can tolerate it at a low dose now    Social History:   Social History   Socioeconomic History   Marital status: Divorced    Spouse name: Not on file   Number of children: Not on file   Years of education: Not on file   Highest education level: Not on file  Occupational History   Not on file  Tobacco Use   Smoking status: Current Some Day Smoker    Packs/day: 3.00    Years: 35.00    Pack years: 105.00    Types: Cigarettes   Smokeless tobacco: Never Used   Tobacco comment: 02/24/2016 "stopped ~ 8-9 months ago; have had 5 cigarettes in the last week"  Substance and Sexual Activity   Alcohol use: Yes    Comment: 02/24/2016  "none in a long time"   Drug use: Not Currently    Types: Cocaine    Comment: 02/24/2016 "none since 1995"   Sexual activity: Yes  Other Topics Concern   Not on file  Social History Narrative   Not on file   Social Determinants of Health   Financial Resource Strain:    Difficulty of Paying Living Expenses:   Food Insecurity:    Worried About Programme researcher, broadcasting/film/video in the Last Year:    Barista in the Last Year:   Transportation Needs:    Freight forwarder (Medical):    Lack of Transportation (Non-Medical):   Physical Activity:    Days of Exercise per Week:    Minutes of Exercise per Session:   Stress:    Feeling of Stress :   Social Connections:    Frequency of Communication with Friends and Family:    Frequency of Social Gatherings with Friends and Family:    Attends Religious Services:    Active Member of Clubs or Organizations:    Attends Engineer, structural:    Marital Status:   Intimate Partner Violence:    Fear of Current or Ex-Partner:    Emotionally Abused:    Physically Abused:    Sexually Abused:     Family History:   The patient's family history includes Heart attack in his father and mother; Heart attack (age of onset: 44) in his sister.    ROS:  Please see the history of present illness.  All other ROS reviewed and negative.     Physical Exam/Data:   Vitals:   05/11/19 1430 05/11/19 1445 05/11/19 1500 05/11/19 1515  BP: 105/62 100/72 111/66 (!) 101/57  Pulse: (!) 55 (!) 58 (!) 41 (!) 52  Resp:      Temp:      TempSrc:      SpO2: 91% 90%  93%  Weight:      Height:  No intake or output data in the 24 hours ending 05/11/19 1542 Last 3 Weights 05/11/2019 03/13/2019 03/12/2019  Weight (lbs) 239 lb 236 lb 14.4 oz 237 lb 1.6 oz  Weight (kg) 108.41 kg 107.457 kg 107.548 kg     Body mass index is 33.33 kg/m.  General:  Well nourished, well developed, in no acute distress HEENT: normal Lymph: no adenopathy Neck: no  JVD Endocrine:  No thryomegaly Vascular: No carotid bruits Cardiac:   RRR; soft SM, no gallops or rubs Lungs:  CTA b/l, no wheezing, rhonchi or rales  Abd: soft, nontender, obese Ext:  edema Musculoskeletal:  No deformities Skin: warm and dry  Neuro:  No gross focal motor abnormalities noted Psych:  Normal affect    EKG:  The ECG that was done today at the [place] was personally reviewed and demonstrates  SR, no acute/ischemic changes  Relevant CV Studies:  [facility] Echocardoigram march 2021 Mild biatrial enlargement Dilated LV EF 30-35% Posterior akinesis, and Lumason uptake in the myocardium c/w prior posterior infarct Grade II diastolic dysfunction Normal RV size and function Mod MR Normal caliber IVC  No pericardial effusion Pacer wire right heart    06/22/2018: [facility] IMPRESSIONS   1. Suboptimal acoustic windows significantly degrade diagnostic capacity  of study.   2. The left ventricle has moderate-severely reduced systolic function,  with an ejection fraction of 30-35%. The cavity size was normal. Left  ventricular diastolic Doppler parameters are consistent with  pseudonormalization. Elevated left ventricular  end-diastolic pressure The E/e' is 16.   3. The inferior vena cava was dilated in size with >50% respiratory  Variability.  Laboratory Data:  High Sensitivity Troponin:   Recent Labs  Lab 05/10/19 1129 05/10/19 1329 05/11/19 1104 05/11/19 1330  TROPONINIHS 21* 18* 14 14      Chemistry Recent Labs  Lab 05/10/19 1129 05/11/19 1104  NA 140 139  K 4.4 4.2  CL 99 98  CO2 28 31  GLUCOSE 116* 120*  BUN 13 15  CREATININE 1.42* 1.47*  CALCIUM 9.2 9.2  GFRNONAA 51* 49*  GFRAA 59* 56*  ANIONGAP 13 10    No results for input(s): PROT, ALBUMIN, AST, ALT, ALKPHOS, BILITOT in the last 168 hours. Hematology Recent Labs  Lab 05/10/19 1129 05/11/19 1104  WBC 8.2 9.1  RBC 5.29 5.76  HGB 16.8 17.7*  HCT 50.2 54.3*  MCV 94.9 94.3  MCH 31.8  30.7  MCHC 33.5 32.6  RDW 14.7 14.8  PLT 153 145*   BNPNo results for input(s): BNP, PROBNP in the last 168 hours.  DDimer No results for input(s): DDIMER in the last 168 hours.   Radiology/Studies:   DG Chest 2 View Result Date: 05/11/2019 CLINICAL DATA:  Chest pain since yesterday. EXAM: CHEST - 2 VIEW COMPARISON:  05/10/2018, 03/12/2019 and 06/21/2018 as well as chest CT 07/25/2018 FINDINGS: Left-sided pacemaker unchanged. Lungs are adequately inflated demonstrate chronic changes over the right mid to lower lung with pleural calcification present. No acute airspace process or effusion. Left lung is clear. Mild stable cardiomegaly. Remainder of the exam is unchanged. IMPRESSION: 1.  No acute findings. 2. Chronic stable findings over the right mid to lower lung including significant pleural calcification. Electronically Signed   By: Elberta Fortis M.D.   On: 05/11/2019 11:25     Assessment and Plan:   1. VT     Not recurrent since 4/27 by office record from [place] today     Palpitations though not symptoms  of his VT today     Continue amio 400mg  BID     ICD battery is near ERI (at 2.63V but not triggered RRT yet)  2. Longstanding persistent AFib     Maintaining SR since his shock on 4/27 by EKG     CHA2DS2Vasc is 3, on Eliquis, appropriately dosed     Hold for cath  3. CAD     Remote interventions     Brief episode of CP yesterday, none since     LHC poss PCI tomorrow  4. ICM     His exam, CXR, and device findings do not suggest volume OL currently     Will give IVF 6 hrs 50mg /hr tonight to give some hydration pre-cath     Will give his aldactone, holding his lasix and his ARB for cath tomorrow  5. COPD     Not wheezing, continue home meds  6. CKD (III)     Creat 1.47 today     Baseline 1.2-1.4     Gentle fluids today     BMET in AM   For questions or updates, please contact [facility] Please consult [email or web address] for contact info under        Signed, 5/27, PA-C  05/11/2019 3:42 PM     I have seen, examined the patient, and reviewed the above assessment and plan.  Changes to above are made where necessary.  On exam, RRR.  He is referred by Sheilah Pigeon for ischemic evaluation/ LHC.  I would therefore recommend left heart catheterization with possible PCI.  Discussed the cath with the patient. The patient understands that risks included but are not limited to stroke (1 in 1000), death (1 in 1000), kidney failure [usually temporary] (1 in 500), bleeding (1 in 200), allergic reaction [possibly serious] (1 in 200). The patient understands and agrees to proceed.  We will admit for anticipated cath tomorrow.  Co Sign: 05/13/2019, MD 05/11/2019 10:41 PM

## 2019-05-11 NOTE — H&P (Signed)
   ICD  VT with shock  ? Angina pre shock. Cath to determine if VT is ischemia related. Last cath 2014.

## 2019-05-11 NOTE — ED Notes (Signed)
Assumed care on patient , denies chest pain/respirations unlabored , IV site intact , waiting for in-patient bed assignment .

## 2019-05-12 ENCOUNTER — Ambulatory Visit (HOSPITAL_COMMUNITY)
Admission: RE | Admit: 2019-05-12 | Payer: Medicare Other | Source: Home / Self Care | Admitting: Interventional Cardiology

## 2019-05-12 ENCOUNTER — Encounter (HOSPITAL_COMMUNITY): Payer: Self-pay | Admitting: Internal Medicine

## 2019-05-12 ENCOUNTER — Other Ambulatory Visit: Payer: Self-pay

## 2019-05-12 ENCOUNTER — Inpatient Hospital Stay (HOSPITAL_COMMUNITY)
Admission: EM | Disposition: A | Discharge status: Home / Self Care | Payer: Self-pay | Source: Home / Self Care | Attending: Internal Medicine

## 2019-05-12 DIAGNOSIS — Z720 Tobacco use: Secondary | ICD-10-CM | POA: Diagnosis not present

## 2019-05-12 DIAGNOSIS — I509 Heart failure, unspecified: Secondary | ICD-10-CM | POA: Diagnosis not present

## 2019-05-12 DIAGNOSIS — Z86711 Personal history of pulmonary embolism: Secondary | ICD-10-CM | POA: Diagnosis not present

## 2019-05-12 DIAGNOSIS — Z20822 Contact with and (suspected) exposure to covid-19: Secondary | ICD-10-CM | POA: Diagnosis present

## 2019-05-12 DIAGNOSIS — T82855A Stenosis of coronary artery stent, initial encounter: Secondary | ICD-10-CM | POA: Diagnosis present

## 2019-05-12 DIAGNOSIS — I272 Pulmonary hypertension, unspecified: Secondary | ICD-10-CM | POA: Diagnosis present

## 2019-05-12 DIAGNOSIS — N183 Chronic kidney disease, stage 3 unspecified: Secondary | ICD-10-CM | POA: Diagnosis present

## 2019-05-12 DIAGNOSIS — I4901 Ventricular fibrillation: Secondary | ICD-10-CM | POA: Diagnosis present

## 2019-05-12 DIAGNOSIS — Z9981 Dependence on supplemental oxygen: Secondary | ICD-10-CM | POA: Diagnosis not present

## 2019-05-12 DIAGNOSIS — I420 Dilated cardiomyopathy: Secondary | ICD-10-CM | POA: Diagnosis present

## 2019-05-12 DIAGNOSIS — Y831 Surgical operation with implant of artificial internal device as the cause of abnormal reaction of the patient, or of later complication, without mention of misadventure at the time of the procedure: Secondary | ICD-10-CM | POA: Diagnosis present

## 2019-05-12 DIAGNOSIS — E039 Hypothyroidism, unspecified: Secondary | ICD-10-CM | POA: Diagnosis present

## 2019-05-12 DIAGNOSIS — E7800 Pure hypercholesterolemia, unspecified: Secondary | ICD-10-CM | POA: Diagnosis present

## 2019-05-12 DIAGNOSIS — G4733 Obstructive sleep apnea (adult) (pediatric): Secondary | ICD-10-CM | POA: Diagnosis present

## 2019-05-12 DIAGNOSIS — I340 Nonrheumatic mitral (valve) insufficiency: Secondary | ICD-10-CM | POA: Diagnosis not present

## 2019-05-12 DIAGNOSIS — I5042 Chronic combined systolic (congestive) and diastolic (congestive) heart failure: Secondary | ICD-10-CM | POA: Diagnosis present

## 2019-05-12 DIAGNOSIS — J439 Emphysema, unspecified: Secondary | ICD-10-CM | POA: Diagnosis not present

## 2019-05-12 DIAGNOSIS — I472 Ventricular tachycardia, unspecified: Secondary | ICD-10-CM

## 2019-05-12 DIAGNOSIS — I2510 Atherosclerotic heart disease of native coronary artery without angina pectoris: Secondary | ICD-10-CM | POA: Diagnosis present

## 2019-05-12 DIAGNOSIS — I5023 Acute on chronic systolic (congestive) heart failure: Secondary | ICD-10-CM | POA: Diagnosis not present

## 2019-05-12 DIAGNOSIS — I4819 Other persistent atrial fibrillation: Secondary | ICD-10-CM | POA: Diagnosis present

## 2019-05-12 DIAGNOSIS — K219 Gastro-esophageal reflux disease without esophagitis: Secondary | ICD-10-CM | POA: Diagnosis present

## 2019-05-12 DIAGNOSIS — I25118 Atherosclerotic heart disease of native coronary artery with other forms of angina pectoris: Secondary | ICD-10-CM | POA: Diagnosis present

## 2019-05-12 DIAGNOSIS — I252 Old myocardial infarction: Secondary | ICD-10-CM | POA: Diagnosis not present

## 2019-05-12 DIAGNOSIS — I482 Chronic atrial fibrillation, unspecified: Secondary | ICD-10-CM | POA: Diagnosis not present

## 2019-05-12 DIAGNOSIS — M159 Polyosteoarthritis, unspecified: Secondary | ICD-10-CM | POA: Diagnosis present

## 2019-05-12 DIAGNOSIS — I255 Ischemic cardiomyopathy: Secondary | ICD-10-CM | POA: Diagnosis present

## 2019-05-12 DIAGNOSIS — R079 Chest pain, unspecified: Secondary | ICD-10-CM

## 2019-05-12 DIAGNOSIS — E785 Hyperlipidemia, unspecified: Secondary | ICD-10-CM | POA: Diagnosis present

## 2019-05-12 DIAGNOSIS — I4811 Longstanding persistent atrial fibrillation: Secondary | ICD-10-CM | POA: Diagnosis present

## 2019-05-12 DIAGNOSIS — Z95810 Presence of automatic (implantable) cardiac defibrillator: Secondary | ICD-10-CM | POA: Diagnosis not present

## 2019-05-12 DIAGNOSIS — R0789 Other chest pain: Secondary | ICD-10-CM | POA: Diagnosis present

## 2019-05-12 DIAGNOSIS — J449 Chronic obstructive pulmonary disease, unspecified: Secondary | ICD-10-CM | POA: Diagnosis present

## 2019-05-12 HISTORY — PX: INTRAVASCULAR PRESSURE WIRE/FFR STUDY: CATH118243

## 2019-05-12 HISTORY — PX: LEFT HEART CATH AND CORONARY ANGIOGRAPHY: CATH118249

## 2019-05-12 LAB — BASIC METABOLIC PANEL
Anion gap: 10 (ref 5–15)
BUN: 16 mg/dL (ref 8–23)
CO2: 29 mmol/L (ref 22–32)
Calcium: 8.8 mg/dL — ABNORMAL LOW (ref 8.9–10.3)
Chloride: 100 mmol/L (ref 98–111)
Creatinine, Ser: 1.33 mg/dL — ABNORMAL HIGH (ref 0.61–1.24)
GFR calc Af Amer: >60 mL/min (ref >60)
GFR calc non Af Amer: 55 mL/min — ABNORMAL LOW (ref >60)
Glucose, Bld: 93 mg/dL (ref 70–99)
Potassium: 3.7 mmol/L (ref 3.5–5.1)
Sodium: 139 mmol/L (ref 135–145)

## 2019-05-12 LAB — MAGNESIUM: Magnesium: 2.1 mg/dL (ref 1.7–2.4)

## 2019-05-12 LAB — APTT: aPTT: 116 seconds — ABNORMAL HIGH (ref 24–36)

## 2019-05-12 LAB — HEPARIN LEVEL (UNFRACTIONATED): Heparin Unfractionated: 1.26 IU/mL — ABNORMAL HIGH (ref 0.30–0.70)

## 2019-05-12 LAB — POCT ACTIVATED CLOTTING TIME: Activated Clotting Time: 263 seconds

## 2019-05-12 SURGERY — LEFT HEART CATH AND CORONARY ANGIOGRAPHY
Anesthesia: LOCAL

## 2019-05-12 MED ORDER — HEPARIN (PORCINE) IN NACL 1000-0.9 UT/500ML-% IV SOLN
INTRAVENOUS | Status: AC
  Filled 2019-05-12: qty 1000

## 2019-05-12 MED ORDER — SODIUM CHLORIDE 0.9% FLUSH: 3.0000 mL | INTRAVENOUS | Status: DC | PRN

## 2019-05-12 MED ORDER — SODIUM CHLORIDE 0.9 % IV SOLN: INTRAVENOUS | Status: DC

## 2019-05-12 MED ORDER — VERAPAMIL HCL 2.5 MG/ML IV SOLN
INTRAVENOUS | Status: DC | PRN
  Administered 2019-05-12: 15:00:00 10 mL via INTRA_ARTERIAL

## 2019-05-12 MED ORDER — SODIUM CHLORIDE 0.9% FLUSH
3.0000 mL | Freq: Two times a day (BID) | INTRAVENOUS | Status: DC
  Administered 2019-05-12 – 2019-05-15 (×3): 3 mL via INTRAVENOUS

## 2019-05-12 MED ORDER — FENTANYL CITRATE (PF) 100 MCG/2ML IJ SOLN
INTRAMUSCULAR | Status: AC
  Filled 2019-05-12: qty 2

## 2019-05-12 MED ORDER — SODIUM CHLORIDE 0.9% FLUSH
3.0000 mL | Freq: Two times a day (BID) | INTRAVENOUS | Status: DC
  Administered 2019-05-14: 3 mL via INTRAVENOUS

## 2019-05-12 MED ORDER — NITROGLYCERIN 1 MG/10 ML FOR IR/CATH LAB
INTRA_ARTERIAL | Status: AC
  Filled 2019-05-12: qty 10

## 2019-05-12 MED ORDER — MIDAZOLAM HCL 2 MG/2ML IJ SOLN
INTRAMUSCULAR | Status: DC | PRN
  Administered 2019-05-12: 1 mg via INTRAVENOUS

## 2019-05-12 MED ORDER — ATORVASTATIN CALCIUM 80 MG PO TABS
80.0000 mg | ORAL_TABLET | Freq: Every day | ORAL | Status: DC
  Administered 2019-05-12 – 2019-05-15 (×4): 80 mg via ORAL
  Filled 2019-05-12 (×4): qty 1

## 2019-05-12 MED ORDER — FENTANYL CITRATE (PF) 100 MCG/2ML IJ SOLN
INTRAMUSCULAR | Status: DC | PRN
  Administered 2019-05-12: 25 ug via INTRAVENOUS

## 2019-05-12 MED ORDER — IOHEXOL 350 MG/ML SOLN
INTRAVENOUS | Status: DC | PRN
  Administered 2019-05-12: 15:00:00 130 mL

## 2019-05-12 MED ORDER — SODIUM CHLORIDE 0.9 % IV SOLN: 250.0000 mL | INTRAVENOUS | Status: DC | PRN

## 2019-05-12 MED ORDER — HEPARIN SODIUM (PORCINE) 1000 UNIT/ML IJ SOLN
INTRAMUSCULAR | Status: DC | PRN
  Administered 2019-05-12: 5500 [IU] via INTRAVENOUS
  Administered 2019-05-12: 5000 [IU] via INTRAVENOUS

## 2019-05-12 MED ORDER — LIDOCAINE HCL (PF) 1 % IJ SOLN
INTRAMUSCULAR | Status: AC
  Filled 2019-05-12: qty 30

## 2019-05-12 MED ORDER — ASPIRIN 81 MG PO CHEW
81.0000 mg | CHEWABLE_TABLET | Freq: Every day | ORAL | Status: DC
  Administered 2019-05-13 – 2019-05-15 (×3): 81 mg via ORAL
  Filled 2019-05-12 (×3): qty 1

## 2019-05-12 MED ORDER — ACETAMINOPHEN 325 MG PO TABS: 650.0000 mg | ORAL_TABLET | ORAL | Status: DC | PRN

## 2019-05-12 MED ORDER — ASPIRIN 81 MG PO CHEW
81.0000 mg | CHEWABLE_TABLET | ORAL | Status: AC
  Administered 2019-05-12: 81 mg via ORAL
  Filled 2019-05-12: qty 1

## 2019-05-12 MED ORDER — ENOXAPARIN SODIUM 40 MG/0.4ML ~~LOC~~ SOLN: 40.0000 mg | SUBCUTANEOUS | Status: DC

## 2019-05-12 MED ORDER — SODIUM CHLORIDE 0.9 % WEIGHT BASED INFUSION
0.5000 mL/kg/h | INTRAVENOUS | Status: AC
  Administered 2019-05-12 (×2): 0.5 mL/kg/h via INTRAVENOUS

## 2019-05-12 MED ORDER — LABETALOL HCL 5 MG/ML IV SOLN: 10.0000 mg | INTRAVENOUS | Status: AC | PRN

## 2019-05-12 MED ORDER — ONDANSETRON HCL 4 MG/2ML IJ SOLN: 4.0000 mg | Freq: Four times a day (QID) | INTRAMUSCULAR | Status: DC | PRN

## 2019-05-12 MED ORDER — HEPARIN (PORCINE) 25000 UT/250ML-% IV SOLN
1150.0000 [IU]/h | INTRAVENOUS | Status: DC
  Administered 2019-05-12 – 2019-05-13 (×2): 1500 [IU]/h via INTRAVENOUS
  Administered 2019-05-14: 1100 [IU]/h via INTRAVENOUS
  Administered 2019-05-15: 1150 [IU]/h via INTRAVENOUS
  Filled 2019-05-12 (×4): qty 250

## 2019-05-12 MED ORDER — ASPIRIN 81 MG PO CHEW: 81.0000 mg | CHEWABLE_TABLET | ORAL | Status: DC

## 2019-05-12 MED ORDER — HYDRALAZINE HCL 20 MG/ML IJ SOLN: 10.0000 mg | INTRAMUSCULAR | Status: AC | PRN

## 2019-05-12 MED ORDER — HEPARIN (PORCINE) IN NACL 1000-0.9 UT/500ML-% IV SOLN
INTRAVENOUS | Status: DC | PRN
  Administered 2019-05-12: 500 mL

## 2019-05-12 MED ORDER — VERAPAMIL HCL 2.5 MG/ML IV SOLN
INTRAVENOUS | Status: AC
  Filled 2019-05-12: qty 2

## 2019-05-12 MED ORDER — LIDOCAINE HCL (PF) 1 % IJ SOLN
INTRAMUSCULAR | Status: DC | PRN
  Administered 2019-05-12: 2 mL

## 2019-05-12 MED ORDER — POTASSIUM CHLORIDE CRYS ER 20 MEQ PO TBCR
20.0000 meq | EXTENDED_RELEASE_TABLET | Freq: Once | ORAL | Status: AC
  Administered 2019-05-12: 20 meq via ORAL
  Filled 2019-05-12: qty 1

## 2019-05-12 MED ORDER — MIDAZOLAM HCL 2 MG/2ML IJ SOLN
INTRAMUSCULAR | Status: AC
  Filled 2019-05-12: qty 2

## 2019-05-12 MED ORDER — HEPARIN SODIUM (PORCINE) 1000 UNIT/ML IJ SOLN
INTRAMUSCULAR | Status: AC
  Filled 2019-05-12: qty 1

## 2019-05-12 MED ORDER — OXYCODONE HCL 5 MG PO TABS: 5.0000 mg | ORAL_TABLET | ORAL | Status: DC | PRN

## 2019-05-12 SURGICAL SUPPLY — 13 items
CATH 5FR JL3.5 JR4 ANG PIG MP (CATHETERS) ×1 IMPLANT
CATH VISTA GUIDE 6FR XBLAD3.5 (CATHETERS) ×1 IMPLANT
DEVICE RAD COMP TR BAND LRG (VASCULAR PRODUCTS) ×1 IMPLANT
GLIDESHEATH SLEND A-KIT 6F 22G (SHEATH) ×1 IMPLANT
GUIDEWIRE INQWIRE 1.5J.035X260 (WIRE) IMPLANT
GUIDEWIRE PRESSURE X 175 (WIRE) ×1 IMPLANT
INQWIRE 1.5J .035X260CM (WIRE) ×2
KIT ESSENTIALS PG (KITS) ×1 IMPLANT
KIT HEART LEFT (KITS) ×2 IMPLANT
PACK CARDIAC CATHETERIZATION (CUSTOM PROCEDURE TRAY) ×2 IMPLANT
SHEATH PROBE COVER 6X72 (BAG) ×1 IMPLANT
TRANSDUCER W/STOPCOCK (MISCELLANEOUS) ×2 IMPLANT
TUBING CIL FLEX 10 FLL-RA (TUBING) ×2 IMPLANT

## 2019-05-12 NOTE — Progress Notes (Signed)
Called by nurse to clarify pre-cath order - clear liquid diet ordered. D/w cath team, this was put in for him to have clear liquid breakfast then NPO effective afterwards. Have entered orders. Nurse inquired about plan for AM meds - I have reviewed; per usual protocol given renal insufficiency, will hold spironolactone pre-cath. Rosabell Geyer PA-C

## 2019-05-12 NOTE — Care Management Obs Status (Signed)
MEDICARE OBSERVATION STATUS NOTIFICATION   Patient Details  Name: Max Rivas MRN: [id number] Date of Birth: 12/30/1950   Medicare Observation Status Notification Given:  Yes    Leone Haven, RN 05/12/2019, 4:40 PM

## 2019-05-12 NOTE — Progress Notes (Signed)
Patient reported that he noticed new drainage at TR band site adter he opened a bag of chips while having lunch. Marked the new drainage and did not release any air for an additional 30 mins. Area surrounding TR band is soft to touch, no hard masses around the area. Reassess at 1655 and there is no drainage, continued to pull 2cc of air. Patient has no complaints of discomfort.

## 2019-05-12 NOTE — Progress Notes (Signed)
ANTICOAGULATION CONSULT NOTE  Pharmacy Consult for heparin Indication: atrial fibrillation  Heparin Dosing Weight: 98 kg  Labs: Recent Labs    05/10/19 1129 05/11/19 1104 05/12/19 0427  HGB 16.8 17.7*  --   HCT 50.2 54.3*  --   PLT 153 145*  --   CREATININE 1.42* 1.47* 1.33*    Assessment: 69 yom with hx of afib on apixaban PTA presenting with VT. Apixaban held for cath (last dose 4/29 AM per Cardiology MD note).  Patient now s/p cath 4/30. Pharmacy consulted to dose heparin for afib to start 8 hours post-sheath removal. Sheath removed at 1518 per cath procedure log. Will monitor heparin utilizing aPTTs while apixaban expected to influence heparin levels. Hg stable 17.7, plt down a bit to 145. No active bleeding issues reported.  Goal of Therapy:  Heparin level 0.3-0.7 units/ml aPTT 66-102 seconds Monitor platelets by anticoagulation protocol: Yes   Plan:  Baseline aPTT/heparin level No bolus. Start heparin at 1500 units/hr at 2330 (8 hours post-sheath removal) 6hr aPTT Monitor daily heparin level/aPTT/CBC, s/sx bleeding F/u further Cardiology plans   Babs Bertin, PharmD, BCPS Clinical Pharmacist 05/12/2019 5:10 PM

## 2019-05-12 NOTE — Plan of Care (Signed)
Pt is alert and oriented X4. Accompanied by ED staff to the unit. Pt has been oriented to the room. No distress noted at this time. Will continue to monitor patient closely.

## 2019-05-12 NOTE — H&P (Signed)
   Correction: Last cath 2004

## 2019-05-12 NOTE — Plan of Care (Signed)
  Problem: Education: Goal: Knowledge of General Education information will improve Description Including pain rating scale, medication(s)/side effects and non-pharmacologic comfort measures Outcome: Progressing   Problem: Health Behavior/Discharge Planning: Goal: Ability to manage health-related needs will improve Outcome: Progressing   Problem: Clinical Measurements: Goal: Ability to maintain clinical measurements within normal limits will improve Outcome: Progressing Goal: Will remain free from infection Outcome: Progressing Goal: Diagnostic test results will improve Outcome: Progressing Goal: Respiratory complications will improve Outcome: Progressing Goal: Cardiovascular complication will be avoided Outcome: Progressing   Problem: Activity: Goal: Risk for activity intolerance will decrease Outcome: Progressing   Problem: Nutrition: Goal: Adequate nutrition will be maintained Outcome: Progressing   Problem: Coping: Goal: Level of anxiety will decrease Outcome: Progressing   Problem: Pain Managment: Goal: General experience of comfort will improve Outcome: Progressing   Problem: Skin Integrity: Goal: Risk for impaired skin integrity will decrease Outcome: Progressing   

## 2019-05-12 NOTE — CV Procedure (Signed)
   Coronary angiography, left heart catheterization, and nonhyperemic indices of ischemia via right radial approach.  Real-time vascular ultrasound used for arterial access.  Right coronary is dominant and has luminal irregularities but no high-grade obstruction.  Left main is widely patent.  LAD contains bulky calcification with up to 50% narrowing proximal and mid.  Large first diagonal also contains 50 to 60% proximal stenosis.  Bulky calcified nodules noted.  LAD RFR 0.89 /RFR post contrast 0.84.  Diagonal 0.87 RFR /RFR post contrast 0.84 (RFR 0.89 or less is hemodynamically significant).  Heavily calcified and scarred ostial to mid circumflex including unstented ostial to proximal segment followed by the stented segment with diffuse in-stent restenosis.  Stenosis is greater than 95% over a long segment.  Left ventriculography was not performed to preserve contrast.  LVEDP was normal.  Recommendations: Surgical consultation to determine if the patient is a candidate for surgical revascularization.  He has significant comorbidities not to mention low LVEF.  The surgical candidate next best option would be attempted recanalization of the circumflex with orbital atherectomy followed by angioplasty/?  Shockwave PTCA within the stented segment, followed by restenting.  Long-term patency likely to be very low..  Follow kidney function.  130 cc of contrast used in the lab today.  We will hydrate vigorously but be careful not to induce acute heart failure.

## 2019-05-12 NOTE — TOC Progression Note (Addendum)
Transition of Care [facility]) - Progression Note    Patient Details  Name: Max Rivas MRN: [id number] Date of Birth: [birth year]  Transition of Care [facility]) CM/SW Contact  Leone Haven, RN Phone Number: 05/12/2019, 4:09 PM  Clinical Narrative:    [facility], his PCP is Dr. Montez Hageman num  [phone].  CSW is [employer] [phone] ext [phone], pager [phone].         Expected Discharge Plan and Services                                                 Social Determinants of Health (SDOH) Interventions    Readmission Risk Interventions No flowsheet data found.

## 2019-05-12 NOTE — Progress Notes (Signed)
Patient arrives back onto the unit alert and oriented x 4. Patient has no complaints about pain. TR band has slight drainage that the cath nurse noted and marked. 11cc is in the TR band. Will continue to monitor and will begin removing 2cc in 30 mins.

## 2019-05-12 NOTE — Progress Notes (Addendum)
Progress Note  Patient Name: Max Rivas Date of Encounter: 05/12/2019  Primary Cardiologist: Jillyn Hidden, [place] Tia Alert) (previously Dr. Katrinka Blazing) Primary Electrophysiologist:  Dr. On[facility] (previously Dr. Ladona Ridgel)  Subjective   Denies any CP, SOB. Has COPD at baseline. We discussed that his options may include a discussion about surgery.  He shares he lost 2 friends to complications from surgery so is not eager about the idea.  Inpatient Medications    Scheduled Meds: . amiodarone  400 mg Oral BID  . arformoterol  15 mcg Nebulization BID   And  . umeclidinium bromide  1 puff Inhalation Daily  . [START ON 05/13/2019] aspirin  81 mg Oral Daily  . atorvastatin  80 mg Oral Daily  . budesonide  0.5 mg Inhalation BID  . levothyroxine  75 mcg Oral QAC breakfast  . metoprolol succinate  75 mg Oral BID  . montelukast  10 mg Oral BID  . omega-3 acid ethyl esters  1 g Oral BID  . pantoprazole  40 mg Oral Daily  . polyvinyl alcohol  1 drop Both Eyes QID  . potassium chloride  20 mEq Oral Once  . sodium chloride flush  3 mL Intravenous Q12H  . sodium chloride flush  3 mL Intravenous Q12H  . [START ON 05/13/2019] sodium chloride flush  3 mL Intravenous Q12H   Continuous Infusions: . [START ON 05/13/2019] sodium chloride    . sodium chloride 0.5 mL/kg/hr (05/12/19 1646)  . heparin     PRN Meds: [START ON 05/13/2019] sodium chloride, acetaminophen, hydrALAZINE, labetalol, levalbuterol, nitroGLYCERIN, ondansetron (ZOFRAN) IV, oxyCODONE, sodium chloride flush, [START ON 05/13/2019] sodium chloride flush   Vital Signs    Vitals:   05/12/19 1503 05/12/19 1508 05/12/19 1513 05/12/19 1518  BP: (!) 116/55 (!) 118/57 122/60 125/61  Pulse: 62 60 64 64  Resp: (!) 26 (!) 24 19 17   Temp:      TempSrc:      SpO2: 94% 94% 95% 96%  Weight:      Height:        Intake/Output Summary (Last 24 hours) at 05/12/2019 1738 Last data filed at 05/12/2019 1655 Gross per 24 hour  Intake 526.68  ml  Output 1250 ml  Net -723.32 ml   Last 3 Weights 05/12/2019 05/11/2019 03/13/2019  Weight (lbs) 235 lb 12.8 oz 239 lb 236 lb 14.4 oz  Weight (kg) 106.958 kg 108.41 kg 107.457 kg     Telemetry    NSR, rare PVCs - Personally Reviewed  Physical Exam   GEN: No acute distress, obese.  HEENT: Normocephalic, atraumatic, sclera non-icteric. Neck: No JVD or bruits. Cardiac: RRR no murmurs, rubs, or gallops.  Radials/DP/PT 1+ and equal bilaterally.  Respiratory: Diffusely coarse, occasional wheezing bilaterally, no rales or rhonchi. Breathing is unlabored. GI: Soft, nontender, non-distended, BS +x 4. MS: no deformity. Extremities: No clubbing or cyanosis. No edema. Distal pedal pulses are 2+ and equal bilaterally. Right wrist TR band in place Neuro:  AAOx3. Follows commands. Psych:  Responds to questions appropriately with a normal affect.  Labs    High Sensitivity Troponin:   Recent Labs  Lab 05/10/19 1129 05/10/19 1329 05/11/19 1104 05/11/19 1330  TROPONINIHS 21* 18* 14 14      Cardiac EnzymesNo results for input(s): TROPONINI in the last 168 hours. No results for input(s): TROPIPOC in the last 168 hours.   Chemistry Recent Labs  Lab 05/10/19 1129 05/11/19 1104 05/12/19 0427  NA 140 139 139  K  4.4 4.2 3.7  CL 99 98 100  CO2 28 31 29   GLUCOSE 116* 120* 93  BUN 13 15 16   CREATININE 1.42* 1.47* 1.33*  CALCIUM 9.2 9.2 8.8*  GFRNONAA 51* 49* 55*  GFRAA 59* 56* >60  ANIONGAP 13 10 10      Hematology Recent Labs  Lab 05/10/19 1129 05/11/19 1104  WBC 8.2 9.1  RBC 5.29 5.76  HGB 16.8 17.7*  HCT 50.2 54.3*  MCV 94.9 94.3  MCH 31.8 30.7  MCHC 33.5 32.6  RDW 14.7 14.8  PLT 153 145*    BNPNo results for input(s): BNP, PROBNP in the last 168 hours.   DDimer No results for input(s): DDIMER in the last 168 hours.   Radiology    DG Chest 2 View  Result Date: 05/11/2019 CLINICAL DATA:  Chest pain since yesterday. EXAM: CHEST - 2 VIEW COMPARISON:  05/10/2018,  03/12/2019 and 06/21/2018 as well as chest CT 07/25/2018 FINDINGS: Left-sided pacemaker unchanged. Lungs are adequately inflated demonstrate chronic changes over the right mid to lower lung with pleural calcification present. No acute airspace process or effusion. Left lung is clear. Mild stable cardiomegaly. Remainder of the exam is unchanged. IMPRESSION: 1.  No acute findings. 2. Chronic stable findings over the right mid to lower lung including significant pleural calcification. Electronically Signed   By: 03/14/2019 M.D.   On: 05/11/2019 11:25   CARDIAC CATHETERIZATION  Result Date: 05/12/2019  Recurrent, probably ischemically mediated ventricular tachycardia with multiple shocks.  High-grade native and diffuse in-stent restenosis ostial to mid circumflex 95 to 99%.  There is also a 90% stenosis beyond the stented segment.  Widely patent left main  Eccentric bulky and calcified 50 to 65% proximal to mid LAD beyond the origin of the large first diagonal.  Eccentric bulky and calcified ostial to proximal 70% first diagonal.  Both LAD and diagonal are mildly hemodynamically significant based upon RFR values 0.89 and 0.87, respectively.  RFR 0.89 or less is hemodynamically significant.  The right coronary particularly in the mid segment contains diffuse atherosclerosis but no focal high-grade obstruction.  Left ventriculography is not performed because of renal insufficiency.  LVEDP is normal measuring 10 mmHg. RECOMMENDATIONS:  We need to determine if the patient has any surgical options.  The likelihood of durable circumflex result is low due to the diffuse nature of disease and the need to place stents over a long segment, most of which would be stent sandwich.  The LAD and diagonal are mildly hemodynamically significant and could be grafted.  If PCI, will need orbital atherectomy over a long segment then reexpansion of the in-stent restenosis and if resistant, possibly shockwave PCI followed by  extensive ostial to mid vessel restenting.   Cardiac Studies   Cardiac Cath 05/12/19  Recurrent, probably ischemically mediated ventricular tachycardia with multiple shocks.  High-grade native and diffuse in-stent restenosis ostial to mid circumflex 95 to 99%.  There is also a 90% stenosis beyond the stented segment.  Widely patent left main  Eccentric bulky and calcified 50 to 65% proximal to mid LAD beyond the origin of the large first diagonal.  Eccentric bulky and calcified ostial to proximal 70% first diagonal.  Both LAD and diagonal are mildly hemodynamically significant based upon RFR values 0.89 and 0.87, respectively.  RFR 0.89 or less is hemodynamically significant.  The right coronary particularly in the mid segment contains diffuse atherosclerosis but no focal high-grade obstruction.  Left ventriculography is not performed because of renal insufficiency.  LVEDP is normal measuring 10 mmHg.  RECOMMENDATIONS:   We need to determine if the patient has any surgical options.  The likelihood of durable circumflex result is low due to the diffuse nature of disease and the need to place stents over a long segment, most of which would be stent sandwich.  The LAD and diagonal are mildly hemodynamically significant and could be grafted.  If PCI, will need orbital atherectomy over a long segment then reexpansion of the in-stent restenosis and if resistant, possibly shockwave PCI followed by extensive ostial to mid vessel restenting.  Patient Profile     69 y.o. male with CAD (multiple prior MI's s/p multiple stents, lastheart cath was 2004 at [facility]), ICM, chronic systolic CHF s/p ICD, COPD, HLD, OSA intolerant of CPAP, longstanding persistent atrial fibrillation, prior VT/ICD shock (on amiodarone) who presented to [facility] with chest pain, also found to have ICD shock due to VT.  Assessment & Plan    1. Chest pain and CAD - r/o for MI. Cardiac  catheterization as outlined above. Dr. Tresa Endo has reviewed films and tentatively agrees he will need surgical consideration, but will see patient this evening to finalize thoughts. He suggests consultation can be requested in the AM - not acute. I sent a message to weekend team APP box to f/u final MD recs and call in AM if that is what is decided. Continue BB, statin. Lipid profile and LFTs ordered for AM. Pending echo.  2. Ventricular tachycardia - EP evaluated the patient and recommended amiodarone 400mg  BID which we will continue for now. Sent message to cardmaster box for weekend team to review plan for amio dosing with EP given results of cath. Update thyroid and LFTs this admission. Will add Mg to labs with care order to call if Mg level is less than 2.0. Will give KCl x 1 now for K 3.7. Recheck values ordered for the morning. Update echocardiogram.  3. Ischemic cardiomyopathy/chronic systolic CHF - not volume up by device interrogation yesterday. Appears euvolemic. See #5 regarding meds held for cath. Continue metoprolol. Catch-up dose ordered now. Appears was held this AM for HR of 61 - would advise not to hold in the future as patient has backup pacemaker in defibrillator if needed.  4. Longstanding persistent atrial fib - per device interrogation, maintaining NSR since his shock on 4/27. In NSR on telemetry. Eliquis on hold for cath. Per d/w Dr. 5/27, plan heparin per pharmacy without bolus 8 hours post sheath pull until further decisions made about revascularization.   5. CKD stage III - creatinine appears at baseline. Spironolactone held for cath. Anticipate resuming in AM if creatinine OK. Lasix/losartan were also held pre-cath and can consider resuming if stable as well.  6. COPD - continue current pulmonary regimen to include home nebs/inhalers. Patient feels at baseline. He's made strides to d/c tobacco.  For questions or updates, please contact [facility] Please consult  [email or web address] for contact info under Cardiology/STEMI.  Signed, Tresa Endo, PA-C 05/12/2019, 5:38 PM     Patient seen and examined. Agree with assessment and plan. Discussed with Dr. 05/14/2019 and reviewed angiogram cines.  There is severe diffusely calcified disease extending from the ostium of the circumflex mid circumflex and previously stented segment.  With borderline positive DFR involving the diagonal and LAD 3 with initial surgical consultation for consideration of possible bypass surgery.  However, the patient does have significant history of COPD and comorbidities with wheezing.  Suspect ischemia mediated VT, now on amiodarone 400 mg twice a day.  Agree with Dr. Tamala Julian assessment that long-term likelihood of durable circumflex PCI result is low.  Will resume heparin this evening.  We will add low-dose amlodipine for additional anti-ischemic benefit.   Troy Sine, MD, [facility] 05/12/2019 6:07 PM

## 2019-05-13 ENCOUNTER — Inpatient Hospital Stay (HOSPITAL_COMMUNITY): Payer: No Typology Code available for payment source

## 2019-05-13 DIAGNOSIS — I340 Nonrheumatic mitral (valve) insufficiency: Secondary | ICD-10-CM | POA: Diagnosis not present

## 2019-05-13 DIAGNOSIS — I2510 Atherosclerotic heart disease of native coronary artery without angina pectoris: Secondary | ICD-10-CM

## 2019-05-13 DIAGNOSIS — I509 Heart failure, unspecified: Secondary | ICD-10-CM

## 2019-05-13 DIAGNOSIS — J449 Chronic obstructive pulmonary disease, unspecified: Secondary | ICD-10-CM

## 2019-05-13 DIAGNOSIS — I472 Ventricular tachycardia: Secondary | ICD-10-CM | POA: Diagnosis not present

## 2019-05-13 LAB — HEPATIC FUNCTION PANEL
ALT: 14 U/L (ref 0–44)
AST: 19 U/L (ref 15–41)
Albumin: 3.3 g/dL — ABNORMAL LOW (ref 3.5–5.0)
Alkaline Phosphatase: 47 U/L (ref 38–126)
Bilirubin, Direct: 0.2 mg/dL (ref 0.0–0.2)
Indirect Bilirubin: 0.8 mg/dL (ref 0.3–0.9)
Total Bilirubin: 1 mg/dL (ref 0.3–1.2)
Total Protein: 5.6 g/dL — ABNORMAL LOW (ref 6.5–8.1)

## 2019-05-13 LAB — MAGNESIUM: Magnesium: 2.1 mg/dL (ref 1.7–2.4)

## 2019-05-13 LAB — T4, FREE: Free T4: 1.45 ng/dL — ABNORMAL HIGH (ref 0.61–1.12)

## 2019-05-13 LAB — TSH: TSH: 1.184 u[IU]/mL (ref 0.350–4.500)

## 2019-05-13 LAB — BASIC METABOLIC PANEL
Anion gap: 9 (ref 5–15)
BUN: 12 mg/dL (ref 8–23)
CO2: 25 mmol/L (ref 22–32)
Calcium: 8.6 mg/dL — ABNORMAL LOW (ref 8.9–10.3)
Chloride: 105 mmol/L (ref 98–111)
Creatinine, Ser: 1.27 mg/dL — ABNORMAL HIGH (ref 0.61–1.24)
GFR calc Af Amer: >60 mL/min (ref >60)
GFR calc non Af Amer: 58 mL/min — ABNORMAL LOW (ref >60)
Glucose, Bld: 100 mg/dL — ABNORMAL HIGH (ref 70–99)
Potassium: 4.5 mmol/L (ref 3.5–5.1)
Sodium: 139 mmol/L (ref 135–145)

## 2019-05-13 LAB — LIPID PANEL
Cholesterol: 126 mg/dL (ref 0–200)
HDL: 36 mg/dL — ABNORMAL LOW (ref >40)
LDL Cholesterol: 70 mg/dL (ref 0–99)
Total CHOL/HDL Ratio: 3.5 RATIO
Triglycerides: 101 mg/dL (ref <150)
VLDL: 20 mg/dL (ref 0–40)

## 2019-05-13 LAB — HEPARIN LEVEL (UNFRACTIONATED): Heparin Unfractionated: 1.08 [IU]/mL — ABNORMAL HIGH (ref 0.30–0.70)

## 2019-05-13 LAB — CBC
HCT: 46.8 % (ref 39.0–52.0)
Hemoglobin: 15.1 g/dL (ref 13.0–17.0)
MCH: 30.3 pg (ref 26.0–34.0)
MCHC: 32.3 g/dL (ref 30.0–36.0)
MCV: 94 fL (ref 80.0–100.0)
Platelets: 125 10*3/uL — ABNORMAL LOW (ref 150–400)
RBC: 4.98 MIL/uL (ref 4.22–5.81)
RDW: 14.7 % (ref 11.5–15.5)
WBC: 8.3 10*3/uL (ref 4.0–10.5)
nRBC: 0 % (ref 0.0–0.2)

## 2019-05-13 LAB — ECHOCARDIOGRAM COMPLETE
Height: 71 in
Weight: 3795.2 oz

## 2019-05-13 LAB — APTT
aPTT: 97 s — ABNORMAL HIGH (ref 24–36)
aPTT: 145 s — ABNORMAL HIGH (ref 24–36)

## 2019-05-13 MED ORDER — METOPROLOL SUCCINATE ER 50 MG PO TB24
50.0000 mg | ORAL_TABLET | Freq: Two times a day (BID) | ORAL | Status: DC
  Administered 2019-05-13 – 2019-05-15 (×4): 50 mg via ORAL
  Filled 2019-05-13 (×4): qty 1

## 2019-05-13 MED ORDER — RANOLAZINE ER 500 MG PO TB12
500.0000 mg | ORAL_TABLET | Freq: Two times a day (BID) | ORAL | Status: DC
  Administered 2019-05-13 – 2019-05-15 (×5): 500 mg via ORAL
  Filled 2019-05-13 (×5): qty 1

## 2019-05-13 MED ORDER — AMLODIPINE BESYLATE 2.5 MG PO TABS
2.5000 mg | ORAL_TABLET | Freq: Every day | ORAL | Status: DC
  Administered 2019-05-13 – 2019-05-15 (×3): 2.5 mg via ORAL
  Filled 2019-05-13 (×3): qty 1

## 2019-05-13 NOTE — Progress Notes (Signed)
ANTICOAGULATION CONSULT NOTE  Pharmacy Consult for heparin Indication: atrial fibrillation  Heparin Dosing Weight: 98 kg  Labs: Recent Labs    05/10/19 1129 05/10/19 1129 05/11/19 1104 05/12/19 0427 05/12/19 1744 05/13/19 0612  HGB 16.8   < > 17.7*  --   --  15.1  HCT 50.2  --  54.3*  --   --  46.8  PLT 153  --  145*  --   --  125*  APTT  --   --   --   --  116* 97*  HEPARINUNFRC  --   --   --   --  1.26* 1.08*  CREATININE 1.42*   < > 1.47* 1.33*  --  1.27*   < > = values in this interval not displayed.    Assessment: 47 yom with hx of afib on apixaban PTA presenting with VT. Apixaban held for cath (last dose 4/29 AM per Cardiology MD note).  Patient now s/p cath 4/30. Pharmacy consulted to dose heparin for afib. Will monitor heparin utilizing aPTTs while apixaban expected to influence heparin levels. Hg decreased from 17.7 to 15.1 but no bleeding noted. Drop could be dilutional. Plt down a bit to 125. No active bleeding issues or problems with infusion per RN.  APTT was in therapeutic range at 97, Heparin level remains falsely elevated from recent apixaban exposure. Plans noted to consult TCTS  Goal of Therapy:  Heparin level 0.3-0.7 units/ml aPTT 66-102 seconds Monitor platelets by anticoagulation protocol: Yes   Plan:  Continue IV heparin at 1500 units/hr 6hr confirmatory aPTT Monitor daily heparin level/aPTT/CBC, s/sx bleeding F/u further TCTS plans   Alvia Grove, PharmD PGY1 Acute Care Pharmacy Resident 05/13/2019 9:38 AM

## 2019-05-13 NOTE — Progress Notes (Signed)
    Received a cross-coverage note this patient needed a CT Surgery consult. Spoke to Dr. Cliffton Asters who will see the patient today.   Notes also mentioned possible EP consult for Amiodarone dosing. Will await note from rounding Cardiology provider today and can consult EP if determined to be necessary at that time.   Signed, Ellsworth Lennox, PA-C 05/13/2019, 8:09 AM Pager: [phone]

## 2019-05-13 NOTE — Progress Notes (Signed)
Progress Note  Patient Name: Max Rivas Date of Encounter: 05/13/2019  Primary Cardiologist: Jillyn Hidden, [place] T[place] Alert) (previously Dr. Katrinka Blazing) Primary Electrophysiologist:  Dr. On[facility] (previously Dr. Ladona Ridgel)  Patient Profile     69 y.o. male with CAD (multiple prior MI's s/p multiple stents, lastheart cath was 2004 at [facility]), ICM, chronic systolic CHF s/p ICD, COPD, HLD, OSA intolerant of CPAP, longstanding persistent atrial fibrillation, prior VT/ICD shock (on amiodarone) who presented to [facility] with chest pain and recurrent shocks   Device interrogation 4/27 MMVT>> ATP accelerated the Fast VT>> Shock  4/19 MMVT>> ATP accelerated the Fast VT>> Shock  ICD discharge ( appropriate) 2/21 Amio resumed.   Device information MDT single chamber implanted/new RV lead and device 03/19/2011 Initial implant 2005 (RV lead was 6949 lead, is abandoned) Device near ERI   Cath CXo-m--95-99% LADp 65% D1-70% ( +FFR)  CT surgery consult pending   Subjective    Without chest pain-- very concerned about surgery  Wonders about alternatives  Inpatient Medications    Scheduled Meds: . amiodarone  400 mg Oral BID  . arformoterol  15 mcg Nebulization BID   And  . umeclidinium bromide  1 puff Inhalation Daily  . aspirin  81 mg Oral Daily  . atorvastatin  80 mg Oral Daily  . budesonide  0.5 mg Inhalation BID  . levothyroxine  75 mcg Oral QAC breakfast  . metoprolol succinate  75 mg Oral BID  . montelukast  10 mg Oral BID  . omega-3 acid ethyl esters  1 g Oral BID  . pantoprazole  40 mg Oral Daily  . polyvinyl alcohol  1 drop Both Eyes QID  . sodium chloride flush  3 mL Intravenous Q12H  . sodium chloride flush  3 mL Intravenous Q12H  . sodium chloride flush  3 mL Intravenous Q12H   Continuous Infusions: . sodium chloride    . heparin 1,500 Units/hr (05/12/19 2339)   PRN Meds: sodium chloride, acetaminophen, levalbuterol, nitroGLYCERIN,  ondansetron (ZOFRAN) IV, oxyCODONE, sodium chloride flush, sodium chloride flush   Vital Signs    Vitals:   05/13/19 0056 05/13/19 0350 05/13/19 0726 05/13/19 0817  BP: (!) 100/50 (!) 106/55 (!) 100/54 114/63  Pulse: 62 62 (!) 58 (!) 59  Resp: 20 18 16 18   Temp: (!) 97.5 F (36.4 C) (!) 97.4 F (36.3 C) (!) 97.4 F (36.3 C)   TempSrc: Oral Oral Oral   SpO2: 95% 94%  98%  Weight:  107.6 kg    Height:        Intake/Output Summary (Last 24 hours) at 05/13/2019 1044 Last data filed at 05/13/2019 0829 Gross per 24 hour  Intake 1451.46 ml  Output 2475 ml  Net -1023.54 ml   Last 3 Weights 05/13/2019 05/12/2019 05/11/2019  Weight (lbs) 237 lb 3.2 oz 235 lb 12.8 oz 239 lb  Weight (kg) 107.593 kg 106.958 kg 108.41 kg     Telemetry    Telemetry personally reviewed sinus   Physical Exam    Well developed and nourished in no acute distress HENT normal Neck supple with JVP-  flat   Clear Regular rate and rhythm, no murmurs or gallops Abd-soft with active BS No Clubbing cyanosis edema Skin-warm and dry A & Oriented  Grossly normal sensory and motor function     Labs    High Sensitivity Troponin:   Recent Labs  Lab 05/10/19 1129 05/10/19 1329 05/11/19 1104 05/11/19 1330  TROPONINIHS 21*  18* 14 14      Cardiac EnzymesNo results for input(s): TROPONINI in the last 168 hours. No results for input(s): TROPIPOC in the last 168 hours.   Chemistry Recent Labs  Lab 05/11/19 1104 05/12/19 0427 05/13/19 0612  NA 139 139 139  K 4.2 3.7 4.5  CL 98 100 105  CO2 31 29 25   GLUCOSE 120* 93 100*  BUN 15 16 12   CREATININE 1.47* 1.33* 1.27*  CALCIUM 9.2 8.8* 8.6*  PROT  --   --  5.6*  ALBUMIN  --   --  3.3*  AST  --   --  19  ALT  --   --  14  ALKPHOS  --   --  47  BILITOT  --   --  1.0  GFRNONAA 49* 55* 58*  GFRAA 56* >60 >60  ANIONGAP 10 10 9      Hematology Recent Labs  Lab 05/10/19 1129 05/11/19 1104 05/13/19 0612  WBC 8.2 9.1 8.3  RBC 5.29 5.76 4.98  HGB 16.8  17.7* 15.1  HCT 50.2 54.3* 46.8  MCV 94.9 94.3 94.0  MCH 31.8 30.7 30.3  MCHC 33.5 32.6 32.3  RDW 14.7 14.8 14.7  PLT 153 145* 125*    BNPNo results for input(s): BNP, PROBNP in the last 168 hours.   DDimer No results for input(s): DDIMER in the last 168 hours.   Radiology    DG Chest 2 View  Result Date: 05/11/2019 CLINICAL DATA:  Chest pain since yesterday. EXAM: CHEST - 2 VIEW COMPARISON:  05/10/2018, 03/12/2019 and 06/21/2018 as well as chest CT 07/25/2018 FINDINGS: Left-sided pacemaker unchanged. Lungs are adequately inflated demonstrate chronic changes over the right mid to lower lung with pleural calcification present. No acute airspace process or effusion. Left lung is clear. Mild stable cardiomegaly. Remainder of the exam is unchanged. IMPRESSION: 1.  No acute findings. 2. Chronic stable findings over the right mid to lower lung including significant pleural calcification. Electronically Signed   By: 03/14/2019 M.D.   On: 05/11/2019 11:25   CARDIAC CATHETERIZATION  Result Date: 05/12/2019  Recurrent, probably ischemically mediated ventricular tachycardia with multiple shocks.  High-grade native and diffuse in-stent restenosis ostial to mid circumflex 95 to 99%.  There is also a 90% stenosis beyond the stented segment.  Widely patent left main  Eccentric bulky and calcified 50 to 65% proximal to mid LAD beyond the origin of the large first diagonal.  Eccentric bulky and calcified ostial to proximal 70% first diagonal.  Both LAD and diagonal are mildly hemodynamically significant based upon RFR values 0.89 and 0.87, respectively.  RFR 0.89 or less is hemodynamically significant.  The right coronary particularly in the mid segment contains diffuse atherosclerosis but no focal high-grade obstruction.  Left ventriculography is not performed because of renal insufficiency.  LVEDP is normal measuring 10 mmHg. RECOMMENDATIONS:  We need to determine if the patient has any surgical  options.  The likelihood of durable circumflex result is low due to the diffuse nature of disease and the need to place stents over a long segment, most of which would be stent sandwich.  The LAD and diagonal are mildly hemodynamically significant and could be grafted.  If PCI, will need orbital atherectomy over a long segment then reexpansion of the in-stent restenosis and if resistant, possibly shockwave PCI followed by extensive ostial to mid vessel restenting.   Cardiac Studies   Cardiac Cath 05/12/19  Recurrent, probably ischemically mediated ventricular tachycardia with multiple  shocks.  High-grade native and diffuse in-stent restenosis ostial to mid circumflex 95 to 99%.  There is also a 90% stenosis beyond the stented segment.  Widely patent left main  Eccentric bulky and calcified 50 to 65% proximal to mid LAD beyond the origin of the large first diagonal.  Eccentric bulky and calcified ostial to proximal 70% first diagonal.  Both LAD and diagonal are mildly hemodynamically significant based upon RFR values 0.89 and 0.87, respectively.  RFR 0.89 or less is hemodynamically significant.  The right coronary particularly in the mid segment contains diffuse atherosclerosis but no focal high-grade obstruction.  Left ventriculography is not performed because of renal insufficiency.  LVEDP is normal measuring 10 mmHg.  RECOMMENDATIONS:   We need to determine if the patient has any surgical options.  The likelihood of durable circumflex result is low due to the diffuse nature of disease and the need to place stents over a long segment, most of which would be stent sandwich.  The LAD and diagonal are mildly hemodynamically significant and could be grafted.  If PCI, will need orbital atherectomy over a long segment then reexpansion of the in-stent restenosis and if resistant, possibly shockwave PCI followed by extensive ostial to mid vessel restenting.    Assessment & Plan    CAD 2 V CT  consult p  VT- monomorphic  Failed ATP  Amiod  COPD   AF persistent   ICD [employer] approaching ERI    Not inclined to surgery-- can another approach be tried first It seems likely that the drive for revascularization may be the concern of ischemic trigger of VT-- if so mexilitene adjunctive to amiodarone may be helpful If decision made to NOT pursue revascularization then would change ranolazine>>mex   Will try and be more aggressive with medical therapy Will add ranolazine 500 bid Decrease metoprolol So can add amlodipine-- another class of antianginals   ICD change out to be done at [facility]       05/13/2019 10:44 AM

## 2019-05-13 NOTE — Consult Note (Signed)
[address]       [place] [address]             [phone]        [name] [facility] Medical Record #[id number] Date of Birth: 05-Dec-1950  Referring: No ref. provider found Primary Care: Anson Fret, MD Primary Cardiologist:No primary care provider on file.  Chief Complaint:    Chief Complaint  Patient presents with  . AICD Problem    History of Present Illness:     [name] is a 69 year old male with a history of coronary artery disease status post PCI in 2004, congestive heart failure with an ICD placement and COPD.  He presents with multiple recurrent shocks as well as chest pain.  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  CVTS was consulted to discuss plans for surgical revascularization.     Past Medical History:  Diagnosis Date  . Arthritis    "elbows, knees" (02/24/2016)  . Bronchitis 2006  . CHF (congestive heart failure) (HCC)   . COPD (chronic obstructive pulmonary disease) (HCC)   . Coronary artery disease   . GERD (gastroesophageal reflux disease)   . High cholesterol   . Myocardial infarction [facility]) ~ 1995 X 2;1998; 2000; 2004  . On home oxygen therapy    "2L w/activity" (02/24/2016)  . Pacemaker   . Pulmonary embolism (HCC) 02/24/2016  . Sleep apnea    "have CPAP; can't tolerate it" (02/24/2016)    Past Surgical History:  Procedure Laterality Date  . CORONARY ANGIOPLASTY  1995  . CORONARY ANGIOPLASTY WITH STENT PLACEMENT  ~ 1995 - 2004 X 5   "I've got a total of 5 stents" (02/24/2016)  . 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 PACEMAKER GENERATOR CHANGE  03/[facility]  . TONSILLECTOMY        Social History   Tobacco Use  Smoking Status Current Some Day Smoker  . Packs/day: 3.00  . Years: 35.00  . Pack years:  105.00  . Types: Cigarettes  Smokeless Tobacco Never Used  Tobacco Comment   02/24/2016 "stopped ~ 8-9 months ago; have had 5 cigarettes in the last week"    Social History   Substance and Sexual Activity  Alcohol Use Yes   Comment: 02/24/2016 "none in a long time"     Allergies  Allergen Reactions  . Crestor [Rosuvastatin Calcium] Other (See Comments)    Back spasms, but can tolerate it at a low dose now      Current Facility-Administered Medications  Medication Dose Route Frequency Provider Last Rate Last Admin  . 0.9 %  sodium chloride infusion  250 mL Intravenous PRN Lyn Records, MD      . acetaminophen (TYLENOL) tablet 650 mg  650 mg Oral Q4H PRN Lyn Records, MD      . amiodarone (PACERONE) tablet 400 mg  400 mg Oral BID Lyn Records, MD   400 mg at 05/13/19 1012  . amLODipine (NORVASC) tablet 2.5 mg  2.5 mg Oral Daily Duke Salvia, MD   2.5 mg at 05/13/19 1200  . arformoterol (BROVANA) nebulizer solution 15 mcg  15 mcg Nebulization BID Lyn Records, MD   15 mcg at 05/13/19 [phone]  And  . umeclidinium bromide (INCRUSE ELLIPTA) 62.5 MCG/INH 1 puff  1 puff Inhalation Daily Lyn Records, MD   1 puff at 05/13/19 [phone]  . aspirin chewable tablet 81 mg  81 mg Oral Daily Lyn Records, MD   81 mg at 05/13/19 1011  . atorvastatin (LIPITOR) tablet 80 mg  80 mg Oral Daily Lyn Records, MD   80 mg at 05/12/19 1648  . budesonide (PULMICORT) nebulizer solution 0.5 mg  0.5 mg Inhalation BID Lyn Records, MD   0.5 mg at 05/13/19 0816  . heparin ADULT infusion 100 units/mL (25000 units/217mL sodium chloride 0.45%)  1,500 Units/hr Intravenous Continuous von Pearletha Furl, RPH 15 mL/hr at 05/12/19 2339 1,500 Units/hr at 05/12/19 2339  . levalbuterol (XOPENEX) nebulizer solution 0.63 mg  0.63 mg Inhalation Q6H PRN Lyn Records, MD      . levothyroxine (SYNTHROID) tablet 75 mcg  75 mcg Oral QAC breakfast Lyn Records, MD   75 mcg at 05/13/19 0517  . metoprolol succinate  (TOPROL-XL) 24 hr tablet 50 mg  50 mg Oral BID Duke Salvia, MD      . montelukast (SINGULAIR) tablet 10 mg  10 mg Oral BID Lyn Records, MD   10 mg at 05/13/19 1012  . nitroGLYCERIN (NITROSTAT) SL tablet 0.4 mg  0.4 mg Sublingual Q5 Min x 3 PRN Lyn Records, MD      . omega-3 acid ethyl esters (LOVAZA) capsule 1 g  1 g Oral BID Lyn Records, MD   1 g at 05/13/19 1012  . ondansetron (ZOFRAN) injection 4 mg  4 mg Intravenous Q6H PRN Lyn Records, MD      . oxyCODONE (Oxy IR/ROXICODONE) immediate release tablet 5-10 mg  5-10 mg Oral Q4H PRN Lyn Records, MD      . pantoprazole (PROTONIX) EC tablet 40 mg  40 mg Oral Daily Lyn Records, MD   40 mg at 05/13/19 1012  . polyvinyl alcohol (LIQUIFILM TEARS) 1.4 % ophthalmic solution 1 drop  1 drop Both Eyes QID Lyn Records, MD   1 drop at 05/13/19 1012  . ranolazine (RANEXA) 12 hr tablet 500 mg  500 mg Oral BID Duke Salvia, MD   500 mg at 05/13/19 1159  . sodium chloride flush (NS) 0.9 % injection 3 mL  3 mL Intravenous Q12H Lyn Records, MD   3 mL at 05/13/19 1013  . sodium chloride flush (NS) 0.9 % injection 3 mL  3 mL Intravenous PRN Lyn Records, MD      . sodium chloride flush (NS) 0.9 % injection 3 mL  3 mL Intravenous Q12H Lyn Records, MD   3 mL at 05/12/19 1048  . sodium chloride flush (NS) 0.9 % injection 3 mL  3 mL Intravenous Q12H Lyn Records, MD      . sodium chloride flush (NS) 0.9 % injection 3 mL  3 mL Intravenous PRN Lyn Records, MD        Medications Prior to Admission  Medication Sig Dispense Refill Last Dose  . acetaminophen (TYLENOL) 325 MG tablet Take 2 tablets (650 mg total) by mouth every 6 (six) hours as needed for mild pain (or Fever >/= 101). 30 tablet 0   . amiodarone (PACERONE) 400 MG tablet Take 1 tablet (400 mg total) by mouth 2 (two) times daily. 30 tablet 0   . apixaban (ELIQUIS) 5 MG TABS tablet Take  5 mg by mouth 2 (two) times daily.     . carboxymethylcellulose (REFRESH PLUS) 0.5 %  SOLN Place 1 drop into both eyes at bedtime.     . Cholecalciferol (VITAMIN D3) 50 MCG (2000 UT) TABS Take 2,000 Units by mouth daily.     . furosemide (LASIX) 40 MG tablet Take 1 tablet (40 mg total) by mouth daily. If lower extremity edema take 1 additional tablet (Patient taking differently: Take 40 mg by mouth daily. ) 30 tablet 11   . levalbuterol (XOPENEX HFA) 45 MCG/ACT inhaler Inhale 2 puffs into the lungs every 6 (six) hours as needed for wheezing or shortness of breath.     . levothyroxine (SYNTHROID) 75 MCG tablet Take 75 mcg by mouth daily before breakfast.     . losartan (COZAAR) 25 MG tablet Take 12.5 mg by mouth daily.     . metoprolol succinate (TOPROL-XL) 50 MG 24 hr tablet Take 75 mg by mouth 2 (two) times daily.      . mometasone (ASMANEX) 220 MCG/INH inhaler Inhale 2 puffs into the lungs 2 (two) times daily.      . montelukast (SINGULAIR) 10 MG tablet Take 10 mg by mouth 2 (two) times daily.      . Multiple Vitamin (MULTIVITAMIN WITH MINERALS) TABS tablet Take 1 tablet by mouth daily.     . nitroGLYCERIN (NITROSTAT) 0.4 MG SL tablet Place 0.4 mg under the tongue every 5 (five) minutes as needed for chest pain.      Marland Kitchen omega-3 acid ethyl esters (LOVAZA) 1 g capsule Take 1 capsule (1 g total) by mouth 2 (two) times daily. 60 capsule 0   . omeprazole (PRILOSEC) 20 MG capsule Take 20 mg by mouth 2 (two) times daily before a meal.     . Propylene Glycol (SYSTANE BALANCE) 0.6 % SOLN Place 1 drop into both eyes See admin instructions. Place 1 drop into each eye four times a day and apply a warm compress for 10 minutes afterwards     . rosuvastatin (CRESTOR) 40 MG tablet Take 20 mg by mouth at bedtime.      Marland Kitchen spironolactone (ALDACTONE) 25 MG tablet Take 1 tablet (25 mg total) by mouth daily. 30 tablet 6   . Tiotropium Bromide-Olodaterol (STIOLTO RESPIMAT) 2.5-2.5 MCG/ACT AERS Inhale 2 puffs into the lungs daily.        Family History  Problem Relation Age of Onset  . Heart attack  Mother   . Heart attack Father   . Heart attack Sister 6     Review of Systems:   Review of Systems  Constitutional: Negative.   Respiratory: Positive for shortness of breath.   Cardiovascular: Positive for chest pain.      Physical Exam: BP 100/63   Pulse (!) 59   Temp 97.7 F (36.5 C)   Resp 15   Ht 5\' 11"  (1.803 m)   Wt 107.6 kg   SpO2 95%   BMI 33.08 kg/m  Physical Exam  Constitutional: He is oriented to person, place, and time and well-developed, well-nourished, and in no distress. No distress.  HENT:  Head: Normocephalic and atraumatic.  Eyes: Conjunctivae are normal.  Cardiovascular: Normal rate and regular rhythm.  Pulmonary/Chest: Effort normal. No respiratory distress.  Abdominal: He exhibits no distension.  Musculoskeletal:        General: Normal range of motion.     Cervical back: Normal range of motion.  Neurological: He is alert and oriented to person, place,  and time.  Skin: He is not diaphoretic.        I have independently reviewed the above radiologic studies and discussed with the patient   Recent Lab Findings: Lab Results  Component Value Date   WBC 8.3 05/13/2019   HGB 15.1 05/13/2019   HCT 46.8 05/13/2019   PLT 125 (L) 05/13/2019   GLUCOSE 100 (H) 05/13/2019   CHOL 126 05/13/2019   TRIG 101 05/13/2019   HDL 36 (L) 05/13/2019   LDLCALC 70 05/13/2019   ALT 14 05/13/2019   AST 19 05/13/2019   NA 139 05/13/2019   K 4.5 05/13/2019   CL 105 05/13/2019   CREATININE 1.27 (H) 05/13/2019   BUN 12 05/13/2019   CO2 25 05/13/2019   TSH 1.184 05/13/2019      Assessment / Plan:   69 year old male the presents with in-stent restenosis in the circumflex vessel.  He also has LAD and diagonal disease.  In review of his left heart cath he does have a decent targets because when his lateral wall past the stent.  His LAD and diagonal branch is also good vessels.  With the risks and benefits of surgical revascularization were discussed the  patient stated that this would be his least preferred and last option.  This case will need to be discussed further with the cardiology team, but per my understanding they are planning on PCI for Monday 05/15/19.    I  spent 25 minutes counseling the patient face to face.   Lajuana Matte 05/13/2019 1:04 PM

## 2019-05-13 NOTE — Progress Notes (Signed)
  Echocardiogram 2D Echocardiogram has been performed.  Lavine Hargrove A Kory Rains 05/13/2019, 1:46 PM

## 2019-05-13 NOTE — Progress Notes (Signed)
ANTICOAGULATION CONSULT NOTE  Pharmacy Consult for heparin Indication: atrial fibrillation  Heparin Dosing Weight: 98 kg  Labs: Recent Labs    05/11/19 1104 05/12/19 0427 05/12/19 1744 05/13/19 0612 05/13/19 1206  HGB 17.7*  --   --  15.1  --   HCT 54.3*  --   --  46.8  --   PLT 145*  --   --  125*  --   APTT  --   --  116* 97* 145*  HEPARINUNFRC  --   --  1.26* 1.08*  --   CREATININE 1.47* 1.33*  --  1.27*  --     Assessment: 69 yom with hx of afib on apixaban PTA presenting with VT. Apixaban held for cath (last dose 4/29 AM per Cardiology MD note).  Patient now s/p cath 4/30. Pharmacy consulted to dose heparin for afib. Will monitor heparin utilizing aPTTs while apixaban expected to influence heparin levels. Hg decreased from 17.7 to 15.1 but no bleeding noted. Drop could be dilutional. Plt down a bit to 125. No active bleeding issues or problems with infusion per RN.  Confirmatory APTT was supratherapeutic at 145 on heparin rate of 1,500 units/hr. This value does not seem to fit with recent trend, given no increase in heparin rate. RN notes that aptt was drawn in opposite arm of heparin infusion.   Goal of Therapy:  Heparin level 0.3-0.7 units/ml aPTT 66-102 seconds Monitor platelets by anticoagulation protocol: Yes   Plan:  Decrease IV heparin to 1400 units/hr Repeat 8hr aPTT Monitor daily heparin level/aPTT/CBC, s/sx bleeding F/u further TCTS plans  Alvia Grove, PharmD PGY1 Acute Care Pharmacy Resident 05/13/2019 4:24 PM

## 2019-05-14 DIAGNOSIS — I472 Ventricular tachycardia: Secondary | ICD-10-CM | POA: Diagnosis not present

## 2019-05-14 LAB — CBC
HCT: 45.3 % (ref 39.0–52.0)
Hemoglobin: 14.9 g/dL (ref 13.0–17.0)
MCH: 31.5 pg (ref 26.0–34.0)
MCHC: 32.9 g/dL (ref 30.0–36.0)
MCV: 95.8 fL (ref 80.0–100.0)
Platelets: 117 10*3/uL — ABNORMAL LOW (ref 150–400)
RBC: 4.73 MIL/uL (ref 4.22–5.81)
RDW: 14.6 % (ref 11.5–15.5)
WBC: 6.8 10*3/uL (ref 4.0–10.5)
nRBC: 0 % (ref 0.0–0.2)

## 2019-05-14 LAB — APTT
aPTT: 130 seconds — ABNORMAL HIGH (ref 24–36)
aPTT: 134 seconds — ABNORMAL HIGH (ref 24–36)
aPTT: 120 seconds — ABNORMAL HIGH (ref 24–36)

## 2019-05-14 LAB — BASIC METABOLIC PANEL
Anion gap: 8 (ref 5–15)
BUN: 12 mg/dL (ref 8–23)
CO2: 28 mmol/L (ref 22–32)
Calcium: 8.7 mg/dL — ABNORMAL LOW (ref 8.9–10.3)
Chloride: 104 mmol/L (ref 98–111)
Creatinine, Ser: 1.33 mg/dL — ABNORMAL HIGH (ref 0.61–1.24)
GFR calc Af Amer: >60 mL/min (ref >60)
GFR calc non Af Amer: 55 mL/min — ABNORMAL LOW (ref >60)
Glucose, Bld: 122 mg/dL — ABNORMAL HIGH (ref 70–99)
Potassium: 4.3 mmol/L (ref 3.5–5.1)
Sodium: 140 mmol/L (ref 135–145)

## 2019-05-14 LAB — HEPARIN LEVEL (UNFRACTIONATED)
Heparin Unfractionated: 0.88 IU/mL — ABNORMAL HIGH (ref 0.30–0.70)
Heparin Unfractionated: 0.73 IU/mL — ABNORMAL HIGH (ref 0.30–0.70)
Heparin Unfractionated: 0.45 IU/mL (ref 0.30–0.70)

## 2019-05-14 NOTE — Progress Notes (Signed)
Progress Note  Patient Name: Max Rivas Date of Encounter: 05/14/2019  Primary Cardiologist: Jillyn Hidden, PA [facility] Alert) (previously Dr. Katrinka Blazing) Primary Electrophysiologist:  Dr. Onnie [facility] (previously Dr. Ladona Ridgel)  Patient Profile     69 y.o. male with CAD (multiple prior MI's s/p multiple stents, lastheart cath was 2004 at [facility]), ICM, chronic systolic CHF s/p ICD, COPD, HLD, OSA intolerant of CPAP, longstanding persistent atrial fibrillation, prior VT/ICD shock (on amiodarone) who presented to [facility] with chest pain and recurrent shocks   Device interrogation 4/27 MMVT>> ATP accelerated the Fast VT>> Shock  4/19 MMVT>> ATP accelerated the Fast VT>> Shock  ICD discharge ( appropriate) 2/21 Amio resumed.   Device information MDT single chamber implanted/new RV lead and device 03/19/2011 Initial implant 2005 (RV lead was 6949 lead, is abandoned) Device near ERI   Cath CXo-m--95-99% LADp 65% D1-70% ( +FFR)  CT surgery consult >> while w good targets, "the patient stated that this would be his least preferred and last option."   Subjective    Without chest pain  Some palps with difficulty breathing See Below  Inpatient Medications    Scheduled Meds: . amiodarone  400 mg Oral BID  . amLODipine  2.5 mg Oral Daily  . arformoterol  15 mcg Nebulization BID   And  . umeclidinium bromide  1 puff Inhalation Daily  . aspirin  81 mg Oral Daily  . atorvastatin  80 mg Oral Daily  . budesonide  0.5 mg Inhalation BID  . levothyroxine  75 mcg Oral QAC breakfast  . metoprolol succinate  50 mg Oral BID  . montelukast  10 mg Oral BID  . omega-3 acid ethyl esters  1 g Oral BID  . pantoprazole  40 mg Oral Daily  . polyvinyl alcohol  1 drop Both Eyes QID  . ranolazine  500 mg Oral BID  . sodium chloride flush  3 mL Intravenous Q12H  . sodium chloride flush  3 mL Intravenous Q12H  . sodium chloride flush  3 mL Intravenous Q12H   Continuous  Infusions: . sodium chloride    . heparin 1,100 Units/hr (05/14/19 0918)   PRN Meds: sodium chloride, acetaminophen, levalbuterol, nitroGLYCERIN, ondansetron (ZOFRAN) IV, oxyCODONE, sodium chloride flush, sodium chloride flush   Vital Signs    Vitals:   05/14/19 0500 05/14/19 0800 05/14/19 0823 05/14/19 0826  BP:      Pulse:  62 64   Resp: 18  12   Temp:    97.6 F (36.4 C)  TempSrc:    Oral  SpO2: (!) 88%  97%   Weight: 108.1 kg     Height:        Intake/Output Summary (Last 24 hours) at 05/14/2019 1130 Last data filed at 05/14/2019 0848 Gross per 24 hour  Intake 1405.61 ml  Output 1950 ml  Net -544.39 ml   Last 3 Weights 05/14/2019 05/13/2019 05/12/2019  Weight (lbs) 238 lb 6.4 oz 237 lb 3.2 oz 235 lb 12.8 oz  Weight (kg) 108.138 kg 107.593 kg 106.958 kg     Telemetry    Telemetry personally sinus with rare PVC correlating sort of in time with his symptoms   Physical Exam  Well developed and nourished in no acute distress HENT normal Neck supple   Wheezing  Regular rate and rhythm, no murmurs or gallops Abd-soft with active BS No Clubbing cyanosis edema Skin-warm and dry A & Oriented  Grossly normal sensory and motor function  Labs    High Sensitivity Troponin:   Recent Labs  Lab 05/10/19 1129 05/10/19 1329 05/11/19 1104 05/11/19 1330  TROPONINIHS 21* 18* 14 14      Cardiac EnzymesNo results for input(s): TROPONINI in the last 168 hours. No results for input(s): TROPIPOC in the last 168 hours.   Chemistry Recent Labs  Lab 05/12/19 0427 05/13/19 0612 05/14/19 0027  NA 139 139 140  K 3.7 4.5 4.3  CL 100 105 104  CO2 29 25 28   GLUCOSE 93 100* 122*  BUN 16 12 12   CREATININE 1.33* 1.27* 1.33*  CALCIUM 8.8* 8.6* 8.7*  PROT  --  5.6*  --   ALBUMIN  --  3.3*  --   AST  --  19  --   ALT  --  14  --   ALKPHOS  --  47  --   BILITOT  --  1.0  --   GFRNONAA 55* 58* 55*  GFRAA >60 >60 >60  ANIONGAP 10 9 8      Hematology Recent Labs  Lab  05/11/19 1104 05/13/19 0612 05/14/19 0027  WBC 9.1 8.3 6.8  RBC 5.76 4.98 4.73  HGB 17.7* 15.1 14.9  HCT 54.3* 46.8 45.3  MCV 94.3 94.0 95.8  MCH 30.7 30.3 31.5  MCHC 32.6 32.3 32.9  RDW 14.8 14.7 14.6  PLT 145* 125* 117*    BNPNo results for input(s): BNP, PROBNP in the last 168 hours.   DDimer No results for input(s): DDIMER in the last 168 hours.   Radiology    CARDIAC CATHETERIZATION  Result Date: 05/12/2019  Recurrent, probably ischemically mediated ventricular tachycardia with multiple shocks.  High-grade native and diffuse in-stent restenosis ostial to mid circumflex 95 to 99%.  There is also a 90% stenosis beyond the stented segment.  Widely patent left main  Eccentric bulky and calcified 50 to 65% proximal to mid LAD beyond the origin of the large first diagonal.  Eccentric bulky and calcified ostial to proximal 70% first diagonal.  Both LAD and diagonal are mildly hemodynamically significant based upon RFR values 0.89 and 0.87, respectively.  RFR 0.89 or less is hemodynamically significant.  The right coronary particularly in the mid segment contains diffuse atherosclerosis but no focal high-grade obstruction.  Left ventriculography is not performed because of renal insufficiency.  LVEDP is normal measuring 10 mmHg. RECOMMENDATIONS:  We need to determine if the patient has any surgical options.  The likelihood of durable circumflex result is low due to the diffuse nature of disease and the need to place stents over a long segment, most of which would be stent sandwich.  The LAD and diagonal are mildly hemodynamically significant and could be grafted.  If PCI, will need orbital atherectomy over a long segment then reexpansion of the in-stent restenosis and if resistant, possibly shockwave PCI followed by extensive ostial to mid vessel restenting.  ECHOCARDIOGRAM COMPLETE  Result Date: 05/13/2019    ECHOCARDIOGRAM REPORT   Patient Name:   Max Rivas Date of Exam:  05/13/2019 Medical Rec #:  07/13/2019      Height:       71.0 in Accession #:    [name]     Weight:       237.2 lb Date of Birth:  May 17, 1950      BSA:          2.267 m Patient Age:    69 years       BP:  100/63 mmHg Patient Gender: M              HR:           59 bpm. Exam Location:  Inpatient Procedure: 2D Echo Indications:    CAD (coronary artery disease)                 Pre-op evaluation  History:        Patient has prior history of Echocardiogram examinations, most                 recent 06/22/2018. CHF, ICD (implantable                 cardioverter-defibrillator) in place, Pulmonary HTN,                 Arrythmias:VT (ventricular tachycardia) and Atrial Fibrillation,                 Signs/Symptoms:Chest Pain; Risk Factors:Tobacco abuse.  Sonographer:    Leeroy Bock Turrentine Referring Phys: [id number] HARRELL O LIGHTFOOT IMPRESSIONS  1. Akinesis of the inferior and inferolateral walls with overall mild to moderate LV dysfunction; grade 2 diastolic dysfunction; mild MR; mild LAE.  2. Left ventricular ejection fraction, by estimation, is 40 to 45%. The left ventricle has mildly decreased function. The left ventricle demonstrates regional wall motion abnormalities (see scoring diagram/findings for description). The left ventricular  internal cavity size was moderately dilated. Left ventricular diastolic parameters are consistent with Grade II diastolic dysfunction (pseudonormalization).  3. Right ventricular systolic function is normal. The right ventricular size is normal.  4. Left atrial size was mildly dilated.  5. The mitral valve is normal in structure. Mild mitral valve regurgitation. No evidence of mitral stenosis.  6. The aortic valve has an indeterminant number of cusps. Aortic valve regurgitation is not visualized. No aortic stenosis is present.  7. The inferior vena cava is normal in size with greater than 50% respiratory variability, suggesting right atrial pressure of 3 mmHg. FINDINGS  Left  Ventricle: Left ventricular ejection fraction, by estimation, is 40 to 45%. The left ventricle has mildly decreased function. The left ventricle demonstrates regional wall motion abnormalities. The left ventricular internal cavity size was moderately dilated. There is no left ventricular hypertrophy. Left ventricular diastolic parameters are consistent with Grade II diastolic dysfunction (pseudonormalization). Right Ventricle: The right ventricular size is normal. Right ventricular systolic function is normal. Left Atrium: Left atrial size was mildly dilated. Right Atrium: Right atrial size was normal in size. Pericardium: There is no evidence of pericardial effusion. Mitral Valve: The mitral valve is normal in structure. Normal mobility of the mitral valve leaflets. Mild mitral valve regurgitation. No evidence of mitral valve stenosis. Tricuspid Valve: The tricuspid valve is normal in structure. Tricuspid valve regurgitation is not demonstrated. No evidence of tricuspid stenosis. Aortic Valve: The aortic valve has an indeterminant number of cusps. Aortic valve regurgitation is not visualized. No aortic stenosis is present. Aortic valve mean gradient measures 5.0 mmHg. Aortic valve peak gradient measures 9.0 mmHg. Aortic valve area, by VTI measures 1.75 cm. Pulmonic Valve: The pulmonic valve was grossly normal. Pulmonic valve regurgitation is not visualized. No evidence of pulmonic stenosis. Aorta: The aortic root is normal in size and structure. Venous: The inferior vena cava is normal in size with greater than 50% respiratory variability, suggesting right atrial pressure of 3 mmHg.  Additional Comments: Akinesis of the inferior and inferolateral walls with overall mild to moderate LV dysfunction; grade 2 diastolic  dysfunction; mild MR; mild LAE. A pacer wire is visualized.  LEFT VENTRICLE PLAX 2D LVIDd:         6.30 cm  Diastology LVIDs:         5.20 cm  LV e' lateral:   10.90 cm/s LV PW:         1.10 cm  LV E/e'  lateral: 9.5 LV IVS:        1.10 cm  LV e' medial:    7.51 cm/s LVOT diam:     2.00 cm  LV E/e' medial:  13.8 LV SV:         59 LV SV Index:   26 LVOT Area:     3.14 cm  RIGHT VENTRICLE RV S prime:     12.20 cm/s TAPSE (M-mode): 2.4 cm LEFT ATRIUM             Index       RIGHT ATRIUM           Index LA diam:        5.40 cm 2.38 cm/m  RA Area:     24.30 cm LA Vol (A2C):   97.7 ml 43.09 ml/m RA Volume:   79.10 ml  34.89 ml/m LA Vol (A4C):   79.5 ml 35.07 ml/m LA Biplane Vol: 88.6 ml 39.08 ml/m  AORTIC VALVE AV Area (Vmax):    1.68 cm AV Area (Vmean):   1.70 cm AV Area (VTI):     1.75 cm AV Vmax:           150.00 cm/s AV Vmean:          105.000 cm/s AV VTI:            0.336 m AV Peak Grad:      9.0 mmHg AV Mean Grad:      5.0 mmHg LVOT Vmax:         80.30 cm/s LVOT Vmean:        56.900 cm/s LVOT VTI:          0.187 m LVOT/AV VTI ratio: 0.56  AORTA Ao Root diam: 3.30 cm MITRAL VALVE MV Area (PHT): 3.48 cm     SHUNTS MV Decel Time: 218 msec     Systemic VTI:  0.19 m MR Peak grad: 100.0 mmHg    Systemic Diam: 2.00 cm MR Mean grad: 74.0 mmHg MR Vmax:      500.00 cm/s MR Vmean:     419.0 cm/s MV E velocity: 104.00 cm/s MV A velocity: 87.40 cm/s MV E/A ratio:  1.19 Olga Millers MD Electronically signed by Olga Millers MD Signature Date/Time: 05/13/2019/1:59:20 PM    Final     Cardiac Studies   Cardiac Cath 05/12/19  Recurrent, probably ischemically mediated ventricular tachycardia with multiple shocks.  High-grade native and diffuse in-stent restenosis ostial to mid circumflex 95 to 99%.  There is also a 90% stenosis beyond the stented segment.  Widely patent left main  Eccentric bulky and calcified 50 to 65% proximal to mid LAD beyond the origin of the large first diagonal.  Eccentric bulky and calcified ostial to proximal 70% first diagonal.  Both LAD and diagonal are mildly hemodynamically significant based upon RFR values 0.89 and 0.87, respectively.  RFR 0.89 or less is hemodynamically  significant.  The right coronary particularly in the mid segment contains diffuse atherosclerosis but no focal high-grade obstruction.  Left ventriculography is not performed because of renal insufficiency.  LVEDP is normal measuring 10 mmHg.  RECOMMENDATIONS:   We need to determine if the patient has any surgical options.  The likelihood of durable circumflex result is low due to the diffuse nature of disease and the need to place stents over a long segment, most of which would be stent sandwich.  The LAD and diagonal are mildly hemodynamically significant and could be grafted.  If PCI, will need orbital atherectomy over a long segment then reexpansion of the in-stent restenosis and if resistant, possibly shockwave PCI followed by extensive ostial to mid vessel restenting.    Assessment & Plan    CAD 2 V CT consult p  VT- monomorphic  Failed ATP  Amiod  COPD   AF persistent   ICD [employer] approaching ERI    He remains family history disinclined For surgery.  I have taken an aggressive medical approach, adding ranolazine, adding amlodipine.  He remains on beta-blockers.  He remains on amiodarone which is also an anti-ischemic agent.  He will be continued on 400 twice daily x5 more days and then down titrated.  There may be a role for adjunctive mexiletine, a Ib agent in the setting of unrevascularized ischemic heart disease.  This could be adjunctive to amiodarone.  He could replace ranolazine.  Given his disinclination for surgery and, discussions with Dr. Leda Gauze today reflecting the lack of optimism about the role of a percutaneous intervention, with think in terms of discharge tomorrow with [facility] with issues of rev[facility] deferred  ICD change out will be done at the [facility]     05/14/2019 11:30 AM

## 2019-05-14 NOTE — Progress Notes (Signed)
ANTICOAGULATION CONSULT NOTE - Follow Up Consult  Pharmacy Consult for heparin Indication: atrial fibrillation  Labs: Recent Labs    05/11/19 1104 05/11/19 1104 05/11/19 1330 05/12/19 0427 05/12/19 1744 05/13/19 0612 05/13/19 1206 05/14/19 0027  HGB 17.7*   < >  --   --   --  15.1  --  14.9  HCT 54.3*  --   --   --   --  46.8  --  45.3  PLT 145*  --   --   --   --  125*  --  117*  APTT  --    < >  --   --  116* 97* 145* 130*  134*  HEPARINUNFRC  --   --   --   --  1.26* 1.08*  --  0.88*  CREATININE 1.47*   < >  --  1.33*  --  1.27*  --  1.33*  TROPONINIHS 14  --  14  --   --   --   --   --    < > = values in this interval not displayed.    Assessment: 69yo male remains supratherapeutic on heparin after rate change; no gtt issues or signs of bleeding per RN.  Goal of Therapy:  Heparin level 0.3-0.7 units/ml aPTT 66-102 seconds   Plan:  Will decrease heparin gtt by 2 units/kg/hr to 1200 units/hr and check level in 6 hours.    Vernard Gambles, PharmD, BCPS  05/14/2019,1:52 AM

## 2019-05-14 NOTE — Progress Notes (Signed)
ANTICOAGULATION CONSULT NOTE  Pharmacy Consult for heparin Indication: atrial fibrillation  Heparin Dosing Weight: 98 kg  Labs: Recent Labs    05/12/19 0427 05/12/19 1744 05/13/19 0612 05/13/19 0612 05/13/19 1206 05/14/19 0027 05/14/19 0716 05/14/19 1537  HGB  --   --  15.1  --   --  14.9  --   --   HCT  --   --  46.8  --   --  45.3  --   --   PLT  --   --  125*  --   --  117*  --   --   APTT  --    < > 97*   < > 145* 130*  134* 120*  --   HEPARINUNFRC  --    < > 1.08*   < >  --  0.88* 0.73* 0.45  CREATININE 1.33*  --  1.27*  --   --  1.33*  --   --    < > = values in this interval not displayed.    Assessment: 13 yom with hx of afib on apixaban PTA presenting with VT. Apixaban held for cath (last dose 4/29 AM per Cardiology MD note). Patient now s/p cath 4/30. Pharmacy consulted to dose heparin for afib.   HL 0.45 therapeutic but trended down significantly from last level. Planning medical management, no surgery. Will increase heparin slightly to keep in goal.   Goal of Therapy:  Heparin level 0.3-0.7 units/ml aPTT 66-102 seconds Monitor platelets by anticoagulation protocol: Yes   Plan:  Increase IV heparin to 1150 units/hr Watch platelets closely  Monitor daily heparin level/CBC, s/sx bleeding  Alphia Moh, PharmD, BCPS, BCCP Clinical Pharmacist  Please check AMION for all [facility] Pharmacy phone numbers After 10:00 PM, call Main Pharmacy [phone]

## 2019-05-14 NOTE — Progress Notes (Addendum)
ANTICOAGULATION CONSULT NOTE  Pharmacy Consult for heparin Indication: atrial fibrillation  Heparin Dosing Weight: 98 kg  Labs: Recent Labs    05/11/19 1104 05/11/19 1104 05/12/19 0427 05/12/19 1744 05/13/19 0612 05/13/19 0612 05/13/19 1206 05/14/19 0027 05/14/19 0716  HGB 17.7*   < >  --   --  15.1  --   --  14.9  --   HCT 54.3*  --   --   --  46.8  --   --  45.3  --   PLT 145*  --   --   --  125*  --   --  117*  --   APTT  --   --   --    < > 97*   < > 145* 130*  134* 120*  HEPARINUNFRC  --   --   --    < > 1.08*  --   --  0.88* 0.73*  CREATININE 1.47*   < > 1.33*  --  1.27*  --   --  1.33*  --    < > = values in this interval not displayed.    Assessment: 58 yom with hx of afib on apixaban PTA presenting with VT. Apixaban held for cath (last dose 4/29 AM per Cardiology MD note).  Patient now s/p cath 4/30. Pharmacy consulted to dose heparin for afib. Hg normal and stable at 14.9. Plts low and decreased slightly at 117. Monitor closley. No active bleeding issues or problems with infusion per RN.  APTT and heparin level seem to be correlating now. Heparin level slightly supratherapeutic at 0.7 and aPTT supratherapeutic at 120 on heparin rate of 1200 units/hr.   Goal of Therapy:  Heparin level 0.3-0.7 units/ml aPTT 66-102 seconds Monitor platelets by anticoagulation protocol: Yes   Plan:  Decrease IV heparin to 1100 units/hr check 8 hr heparin level Watch platelets closely  Monitor daily heparin level/CBC, s/sx bleeding F/u further TCTS plans  Alvia Grove, PharmD PGY1 Acute Care Pharmacy Resident 05/14/2019 9:22 AM

## 2019-05-14 NOTE — Progress Notes (Signed)
Patient called said he is having a spell like his heart is racing,  SR on the monitor HR 60s-70s, pt  Stated he is having episode back to back like his ICD trying to fire, denies chest pain patient stated he feel like warm crawling from his chest to his head. Cardiology PA notified, 12 lead EKG done per order. Will continue to monitor patient

## 2019-05-14 NOTE — Plan of Care (Signed)
  Problem: Nutrition: Goal: Adequate nutrition will be maintained Outcome: Completed/Met   Problem: Coping: Goal: Level of anxiety will decrease Outcome: Completed/Met   Problem: Pain Managment: Goal: General experience of comfort will improve Outcome: Completed/Met   Problem: Skin Integrity: Goal: Risk for impaired skin integrity will decrease Outcome: Completed/Met   

## 2019-05-15 ENCOUNTER — Inpatient Hospital Stay (HOSPITAL_COMMUNITY): Payer: No Typology Code available for payment source

## 2019-05-15 DIAGNOSIS — I5023 Acute on chronic systolic (congestive) heart failure: Secondary | ICD-10-CM | POA: Diagnosis not present

## 2019-05-15 DIAGNOSIS — I25118 Atherosclerotic heart disease of native coronary artery with other forms of angina pectoris: Secondary | ICD-10-CM | POA: Diagnosis not present

## 2019-05-15 DIAGNOSIS — I482 Chronic atrial fibrillation, unspecified: Secondary | ICD-10-CM

## 2019-05-15 DIAGNOSIS — Z95810 Presence of automatic (implantable) cardiac defibrillator: Secondary | ICD-10-CM

## 2019-05-15 DIAGNOSIS — I472 Ventricular tachycardia: Secondary | ICD-10-CM | POA: Diagnosis not present

## 2019-05-15 LAB — BASIC METABOLIC PANEL
Anion gap: 8 (ref 5–15)
BUN: 14 mg/dL (ref 8–23)
CO2: 27 mmol/L (ref 22–32)
Calcium: 8.8 mg/dL — ABNORMAL LOW (ref 8.9–10.3)
Chloride: 104 mmol/L (ref 98–111)
Creatinine, Ser: 1.45 mg/dL — ABNORMAL HIGH (ref 0.61–1.24)
GFR calc Af Amer: 57 mL/min — ABNORMAL LOW (ref >60)
GFR calc non Af Amer: 49 mL/min — ABNORMAL LOW (ref >60)
Glucose, Bld: 96 mg/dL (ref 70–99)
Potassium: 4.8 mmol/L (ref 3.5–5.1)
Sodium: 139 mmol/L (ref 135–145)

## 2019-05-15 LAB — CBC
HCT: 46.3 % (ref 39.0–52.0)
Hemoglobin: 15.3 g/dL (ref 13.0–17.0)
MCH: 31.2 pg (ref 26.0–34.0)
MCHC: 33 g/dL (ref 30.0–36.0)
MCV: 94.5 fL (ref 80.0–100.0)
Platelets: 120 10*3/uL — ABNORMAL LOW (ref 150–400)
RBC: 4.9 MIL/uL (ref 4.22–5.81)
RDW: 14.6 % (ref 11.5–15.5)
WBC: 8.4 10*3/uL (ref 4.0–10.5)
nRBC: 0 % (ref 0.0–0.2)

## 2019-05-15 LAB — HEPARIN LEVEL (UNFRACTIONATED): Heparin Unfractionated: 0.41 IU/mL (ref 0.30–0.70)

## 2019-05-15 SURGERY — CORONARY ATHERECTOMY
Anesthesia: LOCAL

## 2019-05-15 MED ORDER — APIXABAN 5 MG PO TABS
5.0000 mg | ORAL_TABLET | Freq: Two times a day (BID) | ORAL | Status: DC
  Administered 2019-05-15: 5 mg via ORAL
  Filled 2019-05-15: qty 1

## 2019-05-15 MED ORDER — FUROSEMIDE 40 MG PO TABS
40.0000 mg | ORAL_TABLET | ORAL | 11 refills | Status: DC | PRN
Start: 2019-05-15 — End: 2019-06-01

## 2019-05-15 MED ORDER — RANOLAZINE ER 500 MG PO TB12: 500.0000 mg | ORAL_TABLET | Freq: Two times a day (BID) | ORAL | 3 refills | Status: DC

## 2019-05-15 MED ORDER — METOPROLOL SUCCINATE ER 50 MG PO TB24: 50.0000 mg | ORAL_TABLET | Freq: Two times a day (BID) | ORAL | 3 refills | Status: DC

## 2019-05-15 MED ORDER — AMIODARONE HCL 400 MG PO TABS
ORAL_TABLET | ORAL | 2 refills | Status: DC
Start: 2019-05-15 — End: 2019-06-01

## 2019-05-15 MED ORDER — ASPIRIN 81 MG PO CHEW: 81.0000 mg | CHEWABLE_TABLET | Freq: Every day | ORAL | 3 refills | Status: DC

## 2019-05-15 MED ORDER — ATORVASTATIN CALCIUM 80 MG PO TABS: 80.0000 mg | ORAL_TABLET | Freq: Every day | ORAL | 3 refills | Status: DC

## 2019-05-15 MED FILL — Nitroglycerin IV Soln 100 MCG/ML in D5W: INTRA_ARTERIAL | Qty: 10 | Status: AC

## 2019-05-15 NOTE — Progress Notes (Signed)
ANTICOAGULATION CONSULT NOTE  Pharmacy Consult for heparin Indication: atrial fibrillation  Heparin Dosing Weight: 98 kg  Labs: Recent Labs    05/13/19 0612 05/13/19 0612 05/13/19 1206 05/14/19 0027 05/14/19 0027 05/14/19 0716 05/14/19 1537 05/15/19 0600  HGB 15.1   < >  --  14.9  --   --   --  15.3  HCT 46.8  --   --  45.3  --   --   --  46.3  PLT 125*  --   --  117*  --   --   --  120*  APTT 97*   < > 145* 130*  134*  --  120*  --   --   HEPARINUNFRC 1.08*   < >  --  0.88*   < > 0.73* 0.45 0.41  CREATININE 1.27*  --   --  1.33*  --   --   --  1.45*   < > = values in this interval not displayed.    Assessment: 19 yom with hx of afib on apixaban PTA presenting with VT. Apixaban held for cath (last dose 4/29 AM per Cardiology MD note). Patient now s/p cath 4/30. Pharmacy consulted to dose heparin for afib.   HL remains therapeutic at 0.41, on 1150 units/hr. Planning medical management, no surgery. Hgb 15.3, plt 120. No s/sx of bleeding or infusion issues.   Goal of Therapy:  Heparin level 0.3-0.7 units/ml aPTT 66-102 seconds Monitor platelets by anticoagulation protocol: Yes   Plan:  Continue IV heparin to 1150 units/hr Watch platelets closely  Monitor daily heparin level/CBC, s/sx bleeding F/u restart of apixaban  Sherron Monday, PharmD, BCCCP Clinical Pharmacist  Phone: [phone] 05/15/2019 7:34 AM  Please check AMION for all [facility] Pharmacy phone numbers After 10:00 PM, call Main Pharmacy [phone]

## 2019-05-15 NOTE — Progress Notes (Signed)
    Discussed case with Dr. Graciela Husbands and Dr. Cliffton Asters.  After more discussion with the patient, he is not in favor of CABG.  He did not have angina prior to this episode.  He was on Amiodarone in the past but stopped it in early 2021 to try to decrease his medication load.    I suspect his AICD firing is from his AMio being stopped.  Amio restarted.  Ranexa started as well.  Will hold off on amlodipine or mexilitine.  Stop heparin.  Restart Eliquis.  COnsidered aldactone given reduced EF but given soft BP at times, will hold off.  If sx get worse, he can see Dr. Doylene Bode as an outpatient.  Plan for discharge later today.  Corky Crafts, MD

## 2019-05-15 NOTE — Progress Notes (Signed)
[address]       [place] [address]             [phone]       I met with Max Rivas again.  He remains resolute on his decision to not undergo surgery at this time.  He thinks that his COPD is too severe to recover from the operation.  I explained to him that his dyspnea may also be heart related.  He has agreed to see me in follow-up for further discussion.  Max Rivas

## 2019-05-15 NOTE — Discharge Summary (Addendum)
Discharge Summary    Patient ID: Max Rivas MRN: [id number]; DOB: 05/18/1950  Admit date: 05/11/2019 Discharge date: 05/18/2019  Primary Care Provider: Anson Fret, MD  Primary Cardiologist: Jillyn Hidden, [place] Tia Alert) (previously Dr. Katrinka Blazing) Primary Electrophysiologist:  None   Discharge Diagnoses    Principal Problem:   VT (ventricular tachycardia) (HCC) Active Problems:   Hypercholesterolemia   Tobacco abuse   Coronary artery disease   Chronic atrial fibrillation   ICD (implantable cardioverter-defibrillator) in place   Chronic combined systolic and diastolic CHF (congestive heart failure) (HCC)   Chest pain   Ventricular tachycardia (HCC)    Diagnostic Studies/Procedures    Left heart catheterization 05/12/19:  Recurrent, probably ischemically mediated ventricular tachycardia with multiple shocks.  High-grade native and diffuse in-stent restenosis ostial to mid circumflex 95 to 99%.  There is also a 90% stenosis beyond the stented segment.  Widely patent left main  Eccentric bulky and calcified 50 to 65% proximal to mid LAD beyond the origin of the large first diagonal.  Eccentric bulky and calcified ostial to proximal 70% first diagonal.  Both LAD and diagonal are mildly hemodynamically significant based upon RFR values 0.89 and 0.87, respectively.  RFR 0.89 or less is hemodynamically significant.  The right coronary particularly in the mid segment contains diffuse atherosclerosis but no focal high-grade obstruction.  Left ventriculography is not performed because of renal insufficiency.  LVEDP is normal measuring 10 mmHg.  RECOMMENDATIONS:   We need to determine if the patient has any surgical options.  The likelihood of durable circumflex result is low due to the diffuse nature of disease and the need to place stents over a long segment, most of which would be stent sandwich.  The LAD and diagonal are mildly hemodynamically significant and could be  grafted.  If PCI, will need orbital atherectomy over a long segment then reexpansion of the in-stent restenosis and if resistant, possibly shockwave PCI followed by extensive ostial to mid vessel restenting.  Echocardiogram 05/13/19: 1. Akinesis of the inferior and inferolateral walls with overall mild to  moderate LV dysfunction; grade 2 diastolic dysfunction; mild MR; mild LAE.  2. Left ventricular ejection fraction, by estimation, is 40 to 45%. The  left ventricle has mildly decreased function. The left ventricle  demonstrates regional wall motion abnormalities (see scoring  diagram/findings for description). The left ventricular  internal cavity size was moderately dilated. Left ventricular diastolic  parameters are consistent with Grade II diastolic dysfunction  (pseudonormalization).  3. Right ventricular systolic function is normal. The right ventricular  size is normal.  4. Left atrial size was mildly dilated.  5. The mitral valve is normal in structure. Mild mitral valve  regurgitation. No evidence of mitral stenosis.  6. The aortic valve has an indeterminant number of cusps. Aortic valve  regurgitation is not visualized. No aortic stenosis is present.  7. The inferior vena cava is normal in size with greater than 50%  respiratory variability, suggesting right atrial pressure of 3 mmHg.  _____________   History of Present Illness     Max Rivas is a 69 y.o. male with a PMH ofCAD, (multiple prior MI's multiple stents, lastheart cath was 2004 at [facility]. 3 stents were placed at that time), ICM, chronic CHF (systolic), ICD, atrial fibrillation, COPD, HLD, OSA intolerant of CPAP.  Mr. Rousseau was previously hospitalized here at [facility] 03/12/19 with appropriate ICD shock for VT. At that time, no symptoms of angina, mild abl HS Trop felt demand  in setting of VT/ICD shock. He had recently been taken of amiodarone a few months prior (it seems 2/2 to ongoing  AFib and not felt to be needed). He was resumed on amiodarone (  BID for 16mo then daily) and discharged 03/13/2019.  Her reported taking his medicines as prescribed, including the amiodarone  He reported 05/02/19 he was shocked had the same exact symptoms while planting tomatoes. After this is when his cardiologist reached out to him and got today's appt.  He got a shock 05/09/19 by his device, was playing pool felt the same as prior with onset of feeling hot, weak, lightheaded and sat down was shocked. He did not have full LOC but was near that, again he felt immediately better afterwards.  He returned to [facility] 05/10/19. Stated he was walking into the store got about 1/2 way to the entrance started more SOB then usual, feeling weak, a little lightheaded and thought he was going to get shocked. He walked back to his car did not want to fall/faint in the parking lot and once in his car felt a little central CP. No radiation, took a s/l NTG with relief and called 911. He has remained CP free.  Discussed possible need for ischemic w/u, though his 2nd HS Trop remained negative and given he had an appt to see his primary cardiology team, his amiodarone increased and discharged from the ER  He saw his cardiology/EP service 05/11/19, was felt that he needed ischemic w/u and sent back to [facility] via ambulance to be admitted and undergo cath.  He reports a sensation of heart racing as he was walking into the [place] center, mentioning though is quite a long walk.  He denies any further CP, no syncope or near syncope.  He took his AM medicines as usual this morning, including his Eliquis  LABS K+ 4.2 BUN.47 HS Trop 14, 14 WBC 9.1 H/H 17/54 Plts 145  Device transmission from 05/10/2019 Battery status 2.63V (RRT is 2.63V, has not triggered RRT yet) Lead measurements are good OptiVol is well below threshold 05/09/2019: MMVT > ATP> accelerated to another MMVT in VF zone > 34.2J shock  successful 05/09/2019  Monitored VT episodes 52 seconds 182bpm 05/01/2019: MMVT > ATP > a different MMVT > 34.3J shock successful   Hospital Course     Consultants: Electrophysiology, Cardiothoracic Surgery   1. VT: patient presented with multiple appropriate ICD shocks for management of MMVT in the week leading up to admission. C/f ischemic etiology. He underwent LHC with details below. Ultimately restarted on amiodarone with plans to taper outpatient. Consideration for mexiletine to be considered if he has recurrent VT despite amiodarone load and maximum anti-anginal/ischemic therapies.  - Discharged home on amiodarone  BID x4 days, then  daily x7days, then  daily.   2. Severe multivessel CAD: patient underwent a LHC 05/12/19 to evaluate for ischemic etiology of VT. LHC showed high grade in-stent restenosis of the ostial to mid circumflex with 90% stenosis distal to the stented segment, 50-65% p-mLAD stenosis and 70% ostial to proximal 1st diagonal both mildly hemodynamically significant, with diffuse moderate RCA stenosis. He was felt to be a poor candidate for PCI and felt his best option was surgical repair. He was evaluated by [facility] who offered CABG, however patient declined. He was stared on ranexa  BID.  - Continue aspirin and statin - Continue metoprolol  3. Persistent atrial fibrillation: maintained sinus rhythm following ICD shock 05/09/19.  - Continue metoprolol for rate control -  Continue amiodarone for rhythm control - Continue eliquis for stroke ppx  4. Ischemic cardiomyopathy/chronic combined CHF: EF 40-45% with G2DD this admission. Losartan and spironolactone held throughout admissions as Cr was above baseline. He appeared euvolemic. - Continue metoprolol succinate and prn lasix - Consider restarting losartan and spironolactone if Cr returns to baseline  5. ICD: device is nearing end of life. [facility] - Continue close  outpatient follow-up with the [place].  6. COPD: respiratory status stable throughout admission.  - Home medications continued at discharge  7. Chronic renal insufficiency: Cr 1.45 on the day of discharge - Continue close monitoring outpatient  Did the patient have an acute coronary syndrome (MI, NSTEMI, STEMI, etc) this admission?:  No                               Did the patient have a percutaneous coronary intervention (stent / angioplasty)?:  No.   _____________  Discharge Vitals Blood pressure (!) 114/53, pulse 70, temperature 97.9 F (36.6 C), temperature source Oral, resp. rate 18, height  (1.803 m), weight 107.8 kg, SpO2 94 %.  Filed Weights   05/13/19 0350 05/14/19 0500 05/15/19 0600  Weight: 107.6 kg 108.1 kg 107.8 kg    Labs & Radiologic Studies    CBC No results for input(s): WBC, NEUTROABS, HGB, HCT, MCV, PLT in the last 72 hours. Basic Metabolic Panel No results for input(s): NA, K, CL, CO2, GLUCOSE, BUN, CREATININE, CALCIUM, MG, PHOS in the last 72 hours. Liver Function Tests No results for input(s): AST, ALT, ALKPHOS, BILITOT, PROT, ALBUMIN in the last 72 hours. No results for input(s): LIPASE, AMYLASE in the last 72 hours. High Sensitivity Troponin:   Recent Labs  Lab 05/10/19 1129 05/10/19 1329 05/11/19 1104 05/11/19 1330  TROPONINIHS 21* 18* 14 14    BNP Invalid input(s): POCBNP D-Dimer No results for input(s): DDIMER in the last 72 hours. Hemoglobin A1C No results for input(s): HGBA1C in the last 72 hours. Fasting Lipid Panel No results for input(s): CHOL, HDL, LDLCALC, TRIG, CHOLHDL, LDLDIRECT in the last 72 hours. Thyroid Function Tests No results for input(s): TSH, T4TOTAL, T3FREE, THYROIDAB in the last 72 hours.  Invalid input(s): FREET3 _____________  DG Chest 2 View  Result Date: 05/11/2019 CLINICAL DATA:  Chest pain since yesterday. EXAM: CHEST - 2 VIEW COMPARISON:  05/10/2018, 03/12/2019 and 06/21/2018 as well as chest CT 07/25/2018  FINDINGS: Left-sided pacemaker unchanged. Lungs are adequately inflated demonstrate chronic changes over the right mid to lower lung with pleural calcification present. No acute airspace process or effusion. Left lung is clear. Mild stable cardiomegaly. Remainder of the exam is unchanged. IMPRESSION: 1.  No acute findings. 2. Chronic stable findings over the right mid to lower lung including significant pleural calcification. Electronically Signed   By: Elberta Fortis M.D.   On: 05/11/2019 11:25   DG Chest 2 View  Result Date: 05/10/2019 CLINICAL DATA:  Chest pain and shortness of breath EXAM: CHEST - 2 VIEW COMPARISON:  March 12, 2019 FINDINGS: There is underlying emphysematous change, better delineated on prior CT examination. There is right base pleural thickening. Calcification in this area is noted but better seen on CT, indicative of previous asbestos exposure. There is also calcification along the anterior pleura, similar to prior CT. There is scarring in the right base region. There is slight bibasilar atelectasis as well. No airspace consolidation. Heart  size is normal. Pulmonary vascularity reflects underlying emphysematous change and is stable. Specifically, there is decreased vascularity to the upper lobes in areas of apparent bullous disease. Pacemaker leads are attached to the right atrium and right ventricle. No demonstrable adenopathy. No bone lesions. IMPRESSION: Underlying emphysematous change. Areas of pleural thickening and scarring with calcification, indicative of prior asbestos exposure. Areas of scarring and atelectatic change. No edema or consolidation. Heart size within normal limits. Pacemaker leads attached to right atrium and right ventricle. No adenopathy appreciable. Appearance similar to prior studies. Electronically Signed   By: Bretta Bang III M.D.   On: 05/10/2019 13:55   CARDIAC CATHETERIZATION  Result Date: 05/12/2019  Recurrent, probably ischemically mediated  ventricular tachycardia with multiple shocks.  High-grade native and diffuse in-stent restenosis ostial to mid circumflex 95 to 99%.  There is also a 90% stenosis beyond the stented segment.  Widely patent left main  Eccentric bulky and calcified 50 to 65% proximal to mid LAD beyond the origin of the large first diagonal.  Eccentric bulky and calcified ostial to proximal 70% first diagonal.  Both LAD and diagonal are mildly hemodynamically significant based upon RFR values 0.89 and 0.87, respectively.  RFR 0.89 or less is hemodynamically significant.  The right coronary particularly in the mid segment contains diffuse atherosclerosis but no focal high-grade obstruction.  Left ventriculography is not performed because of renal insufficiency.  LVEDP is normal measuring 10 mmHg. RECOMMENDATIONS:  We need to determine if the patient has any surgical options.  The likelihood of durable circumflex result is low due to the diffuse nature of disease and the need to place stents over a long segment, most of which would be stent sandwich.  The LAD and diagonal are mildly hemodynamically significant and could be grafted.  If PCI, will need orbital atherectomy over a long segment then reexpansion of the in-stent restenosis and if resistant, possibly shockwave PCI followed by extensive ostial to mid vessel restenting.  ECHOCARDIOGRAM COMPLETE  Result Date: 05/13/2019    ECHOCARDIOGRAM REPORT   Patient Name:   Max Rivas Date of Exam: 05/13/2019 Medical Rec #:  [id number]      Height:       71.0 in Accession #:    [id number]     Weight:       237.2 lb Date of Birth:  01-07-1951      BSA:          2.267 m Patient Age:    69 years       BP:           100/63 mmHg Patient Gender: M              HR:           59 bpm. Exam Location:  Inpatient Procedure: 2D Echo Indications:    CAD (coronary artery disease)                 Pre-op evaluation  History:        Patient has prior history of Echocardiogram examinations, most                  recent 06/22/2018. CHF, ICD (implantable                 cardioverter-defibrillator) in place, Pulmonary HTN,                 Arrythmias:VT (ventricular tachycardia) and Atrial Fibrillation,  Signs/Symptoms:Chest Pain; Risk Factors:Tobacco abuse.  Sonographer:    Leeroy Bock Turrentine Referring Phys: [id number] HARRELL O LIGHTFOOT IMPRESSIONS  1. Akinesis of the inferior and inferolateral walls with overall mild to moderate LV dysfunction; grade 2 diastolic dysfunction; mild MR; mild LAE.  2. Left ventricular ejection fraction, by estimation, is 40 to 45%. The left ventricle has mildly decreased function. The left ventricle demonstrates regional wall motion abnormalities (see scoring diagram/findings for description). The left ventricular  internal cavity size was moderately dilated. Left ventricular diastolic parameters are consistent with Grade II diastolic dysfunction (pseudonormalization).  3. Right ventricular systolic function is normal. The right ventricular size is normal.  4. Left atrial size was mildly dilated.  5. The mitral valve is normal in structure. Mild mitral valve regurgitation. No evidence of mitral stenosis.  6. The aortic valve has an indeterminant number of cusps. Aortic valve regurgitation is not visualized. No aortic stenosis is present.  7. The inferior vena cava is normal in size with greater than 50% respiratory variability, suggesting right atrial pressure of 3 mmHg. FINDINGS  Left Ventricle: Left ventricular ejection fraction, by estimation, is 40 to 45%. The left ventricle has mildly decreased function. The left ventricle demonstrates regional wall motion abnormalities. The left ventricular internal cavity size was moderately dilated. There is no left ventricular hypertrophy. Left ventricular diastolic parameters are consistent with Grade II diastolic dysfunction (pseudonormalization). Right Ventricle: The right ventricular size is normal. Right ventricular systolic  function is normal. Left Atrium: Left atrial size was mildly dilated. Right Atrium: Right atrial size was normal in size. Pericardium: There is no evidence of pericardial effusion. Mitral Valve: The mitral valve is normal in structure. Normal mobility of the mitral valve leaflets. Mild mitral valve regurgitation. No evidence of mitral valve stenosis. Tricuspid Valve: The tricuspid valve is normal in structure. Tricuspid valve regurgitation is not demonstrated. No evidence of tricuspid stenosis. Aortic Valve: The aortic valve has an indeterminant number of cusps. Aortic valve regurgitation is not visualized. No aortic stenosis is present. Aortic valve mean gradient measures 5.0 mmHg. Aortic valve peak gradient measures 9.0 mmHg. Aortic valve area, by VTI measures 1.75 cm. Pulmonic Valve: The pulmonic valve was grossly normal. Pulmonic valve regurgitation is not visualized. No evidence of pulmonic stenosis. Aorta: The aortic root is normal in size and structure. Venous: The inferior vena cava is normal in size with greater than 50% respiratory variability, suggesting right atrial pressure of 3 mmHg.  Additional Comments: Akinesis of the inferior and inferolateral walls with overall mild to moderate LV dysfunction; grade 2 diastolic dysfunction; mild MR; mild LAE. A pacer wire is visualized.  LEFT VENTRICLE PLAX 2D LVIDd:         6.30 cm  Diastology LVIDs:         5.20 cm  LV e' lateral:   10.90 cm/s LV PW:         1.10 cm  LV E/e' lateral: 9.5 LV IVS:        1.10 cm  LV e' medial:    7.51 cm/s LVOT diam:     2.00 cm  LV E/e' medial:  13.8 LV SV:         59 LV SV Index:   26 LVOT Area:     3.14 cm  RIGHT VENTRICLE RV S prime:     12.20 cm/s TAPSE (M-mode): 2.4 cm LEFT ATRIUM             Index       RIGHT  ATRIUM           Index LA diam:        5.40 cm 2.38 cm/m  RA Area:     24.30 cm LA Vol (A2C):   97.7 ml 43.09 ml/m RA Volume:   79.10 ml  34.89 ml/m LA Vol (A4C):   79.5 ml 35.07 ml/m LA Biplane Vol: 88.6 ml  39.08 ml/m  AORTIC VALVE AV Area (Vmax):    1.68 cm AV Area (Vmean):   1.70 cm AV Area (VTI):     1.75 cm AV Vmax:           150.00 cm/s AV Vmean:          105.000 cm/s AV VTI:            0.336 m AV Peak Grad:      9.0 mmHg AV Mean Grad:      5.0 mmHg LVOT Vmax:         80.30 cm/s LVOT Vmean:        56.900 cm/s LVOT VTI:          0.187 m LVOT/AV VTI ratio: 0.56  AORTA Ao Root diam: 3.30 cm MITRAL VALVE MV Area (PHT): 3.48 cm     SHUNTS MV Decel Time: 218 msec     Systemic VTI:  0.19 m MR Peak grad: 100.0 mmHg    Systemic Diam: 2.00 cm MR Mean grad: 74.0 mmHg MR Vmax:      500.00 cm/s MR Vmean:     419.0 cm/s MV E velocity: 104.00 cm/s MV A velocity: 87.40 cm/s MV E/A ratio:  1.19 Olga Millers MD Electronically signed by Olga Millers MD Signature Date/Time: 05/13/2019/1:59:20 PM    Final    Disposition   Pt is being discharged home today in good condition.  Follow-up Plans & Appointments    Follow-up Information    Lightfoot, Eliezer Lofts, MD Follow up.   Specialty: Cardiothoracic Surgery Why: Please follow-up with Dr. Cliffton Asters if you wish to pursue bypass surgery in the future. Contact information: [address] [place] [address] [phone]        [facility] Follow up.   Why: Call to schedule an appointment with your cardiologist within 1-2 weeks of discharge. Contact information: [address] [facility] [address] [place] [address] [phone]          Discharge Instructions    Diet - low sodium heart healthy   Complete by: As directed    Increase activity slowly   Complete by: As directed       Discharge Medications   Allergies as of 05/15/2019      Reactions   Crestor [rosuvastatin Calcium] Other (See Comments)   Back spasms, but can tolerate it at a low dose now      Medication List    STOP taking these medications   losartan 25 MG tablet Commonly known as: COZAAR   rosuvastatin 40 MG tablet Commonly known as: CRESTOR    spironolactone 25 MG tablet Commonly known as: ALDACTONE     TAKE these medications   acetaminophen 325 MG tablet Commonly known as: TYLENOL Take 2 tablets (650 mg total) by mouth every 6 (six) hours as needed for mild pain (or Fever >/= 101).   amiodarone 400 MG tablet Commonly known as: Pacerone Take 1 tablet (400 mg total) by mouth 2 (two) times daily for 4 days, THEN 1 tablet (400 mg total) daily for 7 days, THEN  0.5 tablets (200 mg total) daily. Start taking on: May 15, 2019 What changed: See the new instructions.   aspirin 81 MG chewable tablet Chew 1 tablet (81 mg total) by mouth daily.   atorvastatin 80 MG tablet Commonly known as: LIPITOR Take 1 tablet (80 mg total) by mouth daily.   carboxymethylcellulose 0.5 % Soln Commonly known as: REFRESH PLUS Place 1 drop into both eyes at bedtime.   Eliquis 5 MG Tabs tablet Generic drug: apixaban Take 5 mg by mouth 2 (two) times daily.   furosemide 40 MG tablet Commonly known as: Lasix Take 1 tablet (40 mg total) by mouth as needed (for swelling and/or shortness of breath). If lower extremity edema take 1 additional tablet What changed:   when to take this  reasons to take this   levothyroxine 75 MCG tablet Commonly known as: SYNTHROID Take 75 mcg by mouth daily before breakfast.   metoprolol succinate 50 MG 24 hr tablet Commonly known as: TOPROL-XL Take 1 tablet (50 mg total) by mouth 2 (two) times daily. What changed: how much to take   mometasone 220 MCG/INH inhaler Commonly known as: ASMANEX Inhale 2 puffs into the lungs 2 (two) times daily.   montelukast 10 MG tablet Commonly known as: SINGULAIR Take 10 mg by mouth 2 (two) times daily.   multivitamin with minerals Tabs tablet Take 1 tablet by mouth daily.   nitroGLYCERIN 0.4 MG SL tablet Commonly known as: NITROSTAT Place 0.4 mg under the tongue every 5 (five) minutes as needed for chest pain.   omega-3 acid ethyl esters 1 g capsule Commonly known  as: LOVAZA Take 1 capsule (1 g total) by mouth 2 (two) times daily.   omeprazole 20 MG capsule Commonly known as: PRILOSEC Take 20 mg by mouth 2 (two) times daily before a meal.   ranolazine 500 MG 12 hr tablet Commonly known as: RANEXA Take 1 tablet (500 mg total) by mouth 2 (two) times daily.   Stiolto Respimat 2.5-2.5 MCG/ACT Aers Generic drug: Tiotropium Bromide-Olodaterol Inhale 2 puffs into the lungs daily.   Systane Balance 0.6 % Soln Generic drug: Propylene Glycol Place 1 drop into both eyes See admin instructions. Place 1 drop into each eye four times a day and apply a warm compress for 10 minutes afterwards   Vitamin D3 50 MCG (2000 UT) Tabs Take 2,000 Units by mouth daily.   Xopenex HFA 45 MCG/ACT inhaler Generic drug: levalbuterol Inhale 2 puffs into the lungs every 6 (six) hours as needed for wheezing or shortness of breath.          Outstanding Labs/Studies   None  Duration of Discharge Encounter   Greater than 30 minutes including physician time.  Signed, Beatriz Stallion, PA-C 05/18/2019, 2:57 PM   I have examined the patient and reviewed assessment and plan and discussed with patient.  Agree with above as stated.    I had extensive discussions with the patient and with Dr. Tresa Endo regarding the possibility of PCI.  We discussed the fact that his heavily calcified ostial circumflex was not optimal for PCI.  We agreed that CABG would be the best option for him given his LAD disease as well.  Discussed case with Dr. Graciela Husbands and Dr. Cliffton Asters.  After more discussion with the patient, he is not in favor of CABG.  He did not have angina prior to this episode.  He was on Amiodarone in the past but stopped it in early 2021 to try to decrease  his medication load.    I suspect his AICD firing is from his AMio being stopped.  Amio restarted.  Ranexa started as well.  Will hold off on amlodipine or mexilitine.  Stop heparin.  Restart Eliquis.  COnsidered aldactone  given reduced EF but given soft BP at times, will hold off.  If sx get worse, he can see Dr. Milana Obey as an outpatient.  Plan for discharge later today.    Jettie Booze, MD

## 2019-05-15 NOTE — Progress Notes (Addendum)
Progress Note  Patient Name: Max Rivas Date of Encounter: 05/15/2019  Primary Cardiologist: [facility] (previously Dr., Katrinka Blazing)  Subjective   Feels OK, not SOB, wearing O2, he does not wear O2 at home  Inpatient Medications    Scheduled Meds: . amiodarone  400 mg Oral BID  . amLODipine  2.5 mg Oral Daily  . arformoterol  15 mcg Nebulization BID   And  . umeclidinium bromide  1 puff Inhalation Daily  . aspirin  81 mg Oral Daily  . atorvastatin  80 mg Oral Daily  . budesonide  0.5 mg Inhalation BID  . levothyroxine  75 mcg Oral QAC breakfast  . metoprolol succinate  50 mg Oral BID  . montelukast  10 mg Oral BID  . omega-3 acid ethyl esters  1 g Oral BID  . pantoprazole  40 mg Oral Daily  . polyvinyl alcohol  1 drop Both Eyes QID  . ranolazine  500 mg Oral BID  . sodium chloride flush  3 mL Intravenous Q12H  . sodium chloride flush  3 mL Intravenous Q12H  . sodium chloride flush  3 mL Intravenous Q12H   Continuous Infusions: . sodium chloride    . heparin 1,150 Units/hr (05/15/19 1334)   PRN Meds: sodium chloride, acetaminophen, levalbuterol, nitroGLYCERIN, ondansetron (ZOFRAN) IV, oxyCODONE, sodium chloride flush, sodium chloride flush   Vital Signs    Vitals:   05/15/19 0856 05/15/19 0858 05/15/19 0949 05/15/19 1155  BP:   127/72 (!) 114/53  Pulse:    70  Resp:    18  Temp:    97.9 F (36.6 C)  TempSrc:    Oral  SpO2: 95% 95% 94%   Weight:      Height:        Intake/Output Summary (Last 24 hours) at 05/15/2019 1405 Last data filed at 05/15/2019 1300 Gross per 24 hour  Intake 1249.59 ml  Output 2125 ml  Net -875.41 ml   Last 3 Weights 05/15/2019 05/14/2019 05/13/2019  Weight (lbs) 237 lb 11.2 oz 238 lb 6.4 oz 237 lb 3.2 oz  Weight (kg) 107.82 kg 108.138 kg 107.593 kg      Telemetry    SR, occ PVCs - Personally Reviewed  ECG    No new EKGs - Personally Reviewed  Physical Exam   GEN: No acute distress.   Neck: No JVD Cardiac: RRR, no  murmurs, rubs, or gallops.  Respiratory: CTA b/l, normal WOB, not wheezing GI: Soft, nontender, obese  MS: No edema; No deformity. Neuro:  Nonfocal  Psych: Normal affect   Labs    High Sensitivity Troponin:   Recent Labs  Lab 05/10/19 1129 05/10/19 1329 05/11/19 1104 05/11/19 1330  TROPONINIHS 21* 18* 14 14      Chemistry Recent Labs  Lab 05/13/19 0612 05/14/19 0027 05/15/19 0600  NA 139 140 139  K 4.5 4.3 4.8  CL 105 104 104  CO2 25 28 27   GLUCOSE 100* 122* 96  BUN 12 12 14   CREATININE 1.27* 1.33* 1.45*  CALCIUM 8.6* 8.7* 8.8*  PROT 5.6*  --   --   ALBUMIN 3.3*  --   --   AST 19  --   --   ALT 14  --   --   ALKPHOS 47  --   --   BILITOT 1.0  --   --   GFRNONAA 58* 55* 49*  GFRAA >60 >60 57*  ANIONGAP 9 8 8      Hematology  Recent Labs  Lab 05/13/19 0612 05/14/19 0027 05/15/19 0600  WBC 8.3 6.8 8.4  RBC 4.98 4.73 4.90  HGB 15.1 14.9 15.3  HCT 46.8 45.3 46.3  MCV 94.0 95.8 94.5  MCH 30.3 31.5 31.2  MCHC 32.3 32.9 33.0  RDW 14.7 14.6 14.6  PLT 125* 117* 120*    BNPNo results for input(s): BNP, PROBNP in the last 168 hours.   DDimer No results for input(s): DDIMER in the last 168 hours.   Radiology    No results found.  Cardiac Studies   05/12/2019: LHC  Recurrent, probably ischemically mediated ventricular tachycardia with multiple shocks.  High-grade native and diffuse in-stent restenosis ostial to mid circumflex 95 to 99%.  There is also a 90% stenosis beyond the stented segment.  Widely patent left main  Eccentric bulky and calcified 50 to 65% proximal to mid LAD beyond the origin of the large first diagonal.  Eccentric bulky and calcified ostial to proximal 70% first diagonal.  Both LAD and diagonal are mildly hemodynamically significant based upon RFR values 0.89 and 0.87, respectively.  RFR 0.89 or less is hemodynamically significant.  The right coronary particularly in the mid segment contains diffuse atherosclerosis but no focal  high-grade obstruction.  Left ventriculography is not performed because of renal insufficiency.  LVEDP is normal measuring 10 mmHg.  RECOMMENDATIONS:   We need to determine if the patient has any surgical options.  The likelihood of durable circumflex result is low due to the diffuse nature of disease and the need to place stents over a long segment, most of which would be stent sandwich.  The LAD and diagonal are mildly hemodynamically significant and could be grafted.  If PCI, will need orbital atherectomy over a long segment then reexpansion of the in-stent restenosis and if resistant, possibly shockwave PCI followed by extensive ostial to mid vessel restenting.   [facility]; Echocardoigram march 2021 Mild biatrial enlargement Dilated LV EF 30-35% Posterior akinesis, and Lumason uptake in the myocardium c/w prior posterior infarct Grade II diastolic dysfunction Normal RV size and function Mod MR Normal caliber IVC  No pericardial effusion Pacer wire right heart   06/22/2018: TTE IMPRESSIONS  1. Suboptimal acoustic windows significantly degrade diagnostic capacity  of study.  2. The left ventricle has moderate-severely reduced systolic function,  with an ejection fraction of 30-35%. The cavity size was normal. Left  ventricular diastolic Doppler parameters are consistent with  pseudonormalization. Elevated left ventricular  end-diastolic pressure The E/e' is 16.  3. The inferior vena cava was dilated in size with >50% respiratory  Variability.  Patient Profile     69 y.o. male with hx of CAD, (multiple prior MI's multiple stents, [facility]heart cath was [facility] at [facility]. 3 stents were placed at that time), ICM, chronic CHF (systolic), ICD, COPD, HLD, OSA intolerant of CPAP, AFib  Device information MDT single chamber implanted/new RV lead and device 03/19/2011 Initial implant 2005 (RV lead was 6949 lead, is abandoned)  + hx of appropriate  therapies AAD amiodarone (on historically) and restarted March 2021    Mr. Scarpelli was previously hospitalized here at [facility] 03/12/19 with appropriate ICD shock for VT. At that time, no symptoms of angina, mild abl HS Trop felt demand in setting of VT/ICD shock. He had recently been taken of amiodarone a few months prior (it seems 2/2 to ongoing AFib and not felt to be needed). He was resumed on amiodarone (200mg  BID for 38mo then daily) and  discharged 03/13/2019.  He reported taking his medicines as prescribed, including the amiodarone  He reported 05/02/19 he was shocked had the same exact symptoms while planting tomatoes. After this is when his cardiologist reached out to him and got today's appt.  he got a shock last week Tuesday night by his device, was playing pool felt the same as prior with onset of feeling hot, weak, lightheaded and sat down was shocked. He did not have full LOC but was near that, again he felt immediately better afterwards.  He returned to [facility] 05/10/2019. Stated he was walking into the store got about 1/2 way to the entrance started more SOB then usual, feeling weak, a little lightheaded and thought he was going to get shocked. He walked back to his car did not want to fall/faint in the parking lot and once in his car felt a little central CP. No radiation, took a s/l NTG with relief and called 911. He has remained CP free.  Discussed possible need for ischemic w/u, though his 2nd HS Trop remained negative and given he had an appt to see his primary cardiology team, his amiodarone increased and discharged from the ER  He saw his cardiology/EP service 4/29, was felt that he needed ischemic w/u and sent back to [facility] via ambulance to be admitted and undergo cath.  Assessment & Plan     1. VT     Not recurrent since 4/27      Continued on amio 400mg  BID     ICD battery is near ERI (at 2.63V but not triggered RRT yet)      Continue Amiodarone 400mg  BID for 4 more  days, then reduce to 400mg  daily for a week, then 200mg  daily Continue Ranexa, Amlodipine   In brief discussion with Dr. , for now, no mexiletine If he has recurrent VT after amio load and maximum anti-anginal/ischemic therapies could consider adding mexiletine  2. Longstanding persistent AFib     Maintaining SR since his shock on 4/27 by EKG     CHA2DS2Vasc is 3, on Eliquis, appropriately dosed     maintaining SR  3. CAD     significant CAD, he had declined CABG, not felt to be PCI candidate with very complicated disease     He appreciates all the information and discussions, just is not on board with CABG     C/w cardiology team and CTS  4. ICM     her does not appear volume OL currently  5. COPD     Not wheezing, continue home meds     Does not wear O2 at home     wean  6. CKD (III)     looks around his baseline (1.2-1.4 or so)       For questions or updates, please contact [facility] Please consult [email or web address] for contact info under   Signed, , PA-C  05/15/2019, 2:05 PM    EP Attending  Agree with the findings as noted above. Discussed the Ladona Ridgel and Dr. 5/27. The patient has had his amiodarone increased. We considered adding mexitil which might help with ischemic VT but amiodarone alone might control the VT. He definitely has underlying ischemia. He will followup expectantly. He is approaching ERI on his ICD.  Sheilah Pigeon.D.

## 2019-05-15 NOTE — Discharge Instructions (Signed)
Please call your cardiologist at the [facility] to arrange a follow-up appointment to be seen within the next 1-2 weeks.   PLEASE REMEMBER TO BRING ALL OF YOUR MEDICATIONS TO EACH OF YOUR FOLLOW-UP OFFICE VISITS.   PLEASE ATTEND ALL SCHEDULED FOLLOW-UP APPOINTMENTS.   Activity: Increase activity slowly as tolerated. You may shower, but no soaking baths (or swimming) for 1 week. No driving for 24 hours. No lifting over 5 lbs for 1 week. No sexual activity for 1 week.   You May Return to Work: in 1 week (if applicable)  Wound Care: You may wash cath site gently with soap and water. Keep cath site clean and dry. If you notice pain, swelling, bleeding or pus at your cath site, please call [phone].  You are being discharged home on an amiodarone taper: 400mg  two times per day for 4 days, then 400mg  daily for 7 days, then 200mg  (1/2 tablet) daily going forward.

## 2019-05-15 NOTE — Progress Notes (Signed)
Progress Note  Patient Name: Max Rivas Date of Encounter: 05/15/2019  Primary Cardiologist: No primary care provider on file. [facility]  Subjective   Feels well.  Inpatient Medications    Scheduled Meds: . amiodarone  400 mg Oral BID  . amLODipine  2.5 mg Oral Daily  . arformoterol  15 mcg Nebulization BID   And  . umeclidinium bromide  1 puff Inhalation Daily  . aspirin  81 mg Oral Daily  . atorvastatin  80 mg Oral Daily  . budesonide  0.5 mg Inhalation BID  . levothyroxine  75 mcg Oral QAC breakfast  . metoprolol succinate  50 mg Oral BID  . montelukast  10 mg Oral BID  . omega-3 acid ethyl esters  1 g Oral BID  . pantoprazole  40 mg Oral Daily  . polyvinyl alcohol  1 drop Both Eyes QID  . ranolazine  500 mg Oral BID  . sodium chloride flush  3 mL Intravenous Q12H  . sodium chloride flush  3 mL Intravenous Q12H  . sodium chloride flush  3 mL Intravenous Q12H   Continuous Infusions: . sodium chloride    . heparin 1,150 Units/hr (05/14/19 1850)   PRN Meds: sodium chloride, acetaminophen, levalbuterol, nitroGLYCERIN, ondansetron (ZOFRAN) IV, oxyCODONE, sodium chloride flush, sodium chloride flush   Vital Signs    Vitals:   05/15/19 0736 05/15/19 0856 05/15/19 0858 05/15/19 0949  BP: 103/85   127/72  Pulse: 68     Resp:      Temp: 97.6 F (36.4 C)     TempSrc: Oral     SpO2:  95% 95% 94%  Weight:      Height:        Intake/Output Summary (Last 24 hours) at 05/15/2019 1015 Last data filed at 05/15/2019 0900 Gross per 24 hour  Intake 1061.74 ml  Output 2325 ml  Net -1263.26 ml   Last 3 Weights 05/15/2019 05/14/2019 05/13/2019  Weight (lbs) 237 lb 11.2 oz 238 lb 6.4 oz 237 lb 3.2 oz  Weight (kg) 107.82 kg 108.138 kg 107.593 kg      Telemetry    Normal sinus rhythm- Personally Reviewed  ECG    Normal sinus rhythm on May 2, no significant ST changes- Personally Reviewed  Physical Exam   GEN: No acute distress.   Neck: No JVD Cardiac: RRR, no  murmurs, rubs, or gallops.  Respiratory: Clear to auscultation bilaterally. GI: Soft, nontender, non-distended  MS: No edema; No deformity.  No right radial hematoma Neuro:  Nonfocal  Psych: Normal affect   Labs    High Sensitivity Troponin:   Recent Labs  Lab 05/10/19 1129 05/10/19 1329 05/11/19 1104 05/11/19 1330  TROPONINIHS 21* 18* 14 14      Chemistry Recent Labs  Lab 05/13/19 0612 05/14/19 0027 05/15/19 0600  NA 139 140 139  K 4.5 4.3 4.8  CL 105 104 104  CO2 25 28 27   GLUCOSE 100* 122* 96  BUN 12 12 14   CREATININE 1.27* 1.33* 1.45*  CALCIUM 8.6* 8.7* 8.8*  PROT 5.6*  --   --   ALBUMIN 3.3*  --   --   AST 19  --   --   ALT 14  --   --   ALKPHOS 47  --   --   BILITOT 1.0  --   --   GFRNONAA 58* 55* 49*  GFRAA >60 >60 57*  ANIONGAP 9 8 8      Hematology Recent  Labs  Lab 05/13/19 0612 05/14/19 0027 05/15/19 0600  WBC 8.3 6.8 8.4  RBC 4.98 4.73 4.90  HGB 15.1 14.9 15.3  HCT 46.8 45.3 46.3  MCV 94.0 95.8 94.5  MCH 30.3 31.5 31.2  MCHC 32.3 32.9 33.0  RDW 14.7 14.6 14.6  PLT 125* 117* 120*    BNPNo results for input(s): BNP, PROBNP in the last 168 hours.   DDimer No results for input(s): DDIMER in the last 168 hours.   Radiology    ECHOCARDIOGRAM COMPLETE  Result Date: 05/13/2019    ECHOCARDIOGRAM REPORT   Patient Name:   Max Rivas Date of Exam: 05/13/2019 Medical Rec #:  [id number]      Height:       71.0 in Accession #:    [id number]     Weight:       237.2 lb Date of Birth:  June 15, 1950      BSA:          2.267 m Patient Age:    69 years       BP:           100/63 mmHg Patient Gender: M              HR:           59 bpm. Exam Location:  Inpatient Procedure: 2D Echo Indications:    CAD (coronary artery disease)                 Pre-op evaluation  History:        Patient has prior history of Echocardiogram examinations, most                 recent 06/22/2018. CHF, ICD (implantable                 cardioverter-defibrillator) in place, Pulmonary HTN,                  Arrythmias:VT (ventricular tachycardia) and Atrial Fibrillation,                 Signs/Symptoms:Chest Pain; Risk Factors:Tobacco abuse.  Sonographer:    Vikki Ports Turrentine Referring Phys: [id number] [place]  1. Akinesis of the inferior and inferolateral walls with overall mild to moderate LV dysfunction; grade 2 diastolic dysfunction; mild MR; mild LAE.  2. Left ventricular ejection fraction, by estimation, is 40 to 45%. The left ventricle has mildly decreased function. The left ventricle demonstrates regional wall motion abnormalities (see scoring diagram/findings for description). The left ventricular  internal cavity size was moderately dilated. Left ventricular diastolic parameters are consistent with Grade II diastolic dysfunction (pseudonormalization).  3. Right ventricular systolic function is normal. The right ventricular size is normal.  4. Left atrial size was mildly dilated.  5. The mitral valve is normal in structure. Mild mitral valve regurgitation. No evidence of mitral stenosis.  6. The aortic valve has an indeterminant number of cusps. Aortic valve regurgitation is not visualized. No aortic stenosis is present.  7. The inferior vena cava is normal in size with greater than 50% respiratory variability, suggesting right atrial pressure of 3 mmHg. FINDINGS  Left Ventricle: Left ventricular ejection fraction, by estimation, is 40 to 45%. The left ventricle has mildly decreased function. The left ventricle demonstrates regional wall motion abnormalities. The left ventricular internal cavity size was moderately dilated. There is no left ventricular hypertrophy. Left ventricular diastolic parameters are consistent with Grade II diastolic dysfunction (pseudonormalization). Right Ventricle: The right ventricular  size is normal. Right ventricular systolic function is normal. Left Atrium: Left atrial size was mildly dilated. Right Atrium: Right atrial size was normal in size.  Pericardium: There is no evidence of pericardial effusion. Mitral Valve: The mitral valve is normal in structure. Normal mobility of the mitral valve leaflets. Mild mitral valve regurgitation. No evidence of mitral valve stenosis. Tricuspid Valve: The tricuspid valve is normal in structure. Tricuspid valve regurgitation is not demonstrated. No evidence of tricuspid stenosis. Aortic Valve: The aortic valve has an indeterminant number of cusps. Aortic valve regurgitation is not visualized. No aortic stenosis is present. Aortic valve mean gradient measures 5.0 mmHg. Aortic valve peak gradient measures 9.0 mmHg. Aortic valve area, by VTI measures 1.75 cm. Pulmonic Valve: The pulmonic valve was grossly normal. Pulmonic valve regurgitation is not visualized. No evidence of pulmonic stenosis. Aorta: The aortic root is normal in size and structure. Venous: The inferior vena cava is normal in size with greater than 50% respiratory variability, suggesting right atrial pressure of 3 mmHg.  Additional Comments: Akinesis of the inferior and inferolateral walls with overall mild to moderate LV dysfunction; grade 2 diastolic dysfunction; mild MR; mild LAE. A pacer wire is visualized.  LEFT VENTRICLE PLAX 2D LVIDd:         6.30 cm  Diastology LVIDs:         5.20 cm  LV e' lateral:   10.90 cm/s LV PW:         1.10 cm  LV E/e' lateral: 9.5 LV IVS:        1.10 cm  LV e' medial:    7.51 cm/s LVOT diam:     2.00 cm  LV E/e' medial:  13.8 LV SV:         59 LV SV Index:   26 LVOT Area:     3.14 cm  RIGHT VENTRICLE RV S prime:     12.20 cm/s TAPSE (M-mode): 2.4 cm LEFT ATRIUM             Index       RIGHT ATRIUM           Index LA diam:        5.40 cm 2.38 cm/m  RA Area:     24.30 cm LA Vol (A2C):   97.7 ml 43.09 ml/m RA Volume:   79.10 ml  34.89 ml/m LA Vol (A4C):   79.5 ml 35.07 ml/m LA Biplane Vol: 88.6 ml 39.08 ml/m  AORTIC VALVE AV Area (Vmax):    1.68 cm AV Area (Vmean):   1.70 cm AV Area (VTI):     1.75 cm AV Vmax:            150.00 cm/s AV Vmean:          105.000 cm/s AV VTI:            0.336 m AV Peak Grad:      9.0 mmHg AV Mean Grad:      5.0 mmHg LVOT Vmax:         80.30 cm/s LVOT Vmean:        56.900 cm/s LVOT VTI:          0.187 m LVOT/AV VTI ratio: 0.56  AORTA Ao Root diam: 3.30 cm MITRAL VALVE MV Area (PHT): 3.48 cm     SHUNTS MV Decel Time: 218 msec     Systemic VTI:  0.19 m MR Peak grad: 100.0 mmHg    Systemic Diam: 2.00 cm  MR Mean grad: 74.0 mmHg MR Vmax:      500.00 cm/s MR Vmean:     419.0 cm/s MV E velocity: 104.00 cm/s MV A velocity: 87.40 cm/s MV E/A ratio:  1.19 Olga Millers MD Electronically signed by Olga Millers MD Signature Date/Time: 05/13/2019/1:59:20 PM    Final     Cardiac Studies   I personally reviewed the cath films  Patient Profile     69 y.o. male with multivessel CAD, shocks for VT from AICD  Assessment & Plan    CAD: I reviewed the angiogram with the patient so he could also see the challenging anatomy.  The ostial circumflex disease is heavily calcified and there is no landing zone.  This would require stent placement in the distal left main.  This would likely worsen the flow down the LAD which was already significantly blocked.  In addition, since the circumflex is all in-stent restenosis, it is hard to know whether we would get an adequate result.  I think bypass surgery would be the best strategy to revascularize him.  The patient reports that he is concerned that due to chronic lung disease from COPD, and asbestosis, his lifespan is limited from the lung problems.  Therefore, he would prefer to avoid a big surgery like bypass surgery.  He also has a friend who passed away with an several months of having bypass surgery.  He had not been having any angina.    We then discussed medical therapy.  Trying to suppress his arrhythmia will be paramount.  Will discuss with EP what the optimal medical regimen would be.  It appears amiodarone has been started.  He is also on ranolazine.   There was talk of mexiletine but this has not been ordered.  I spoke with Dr. Cliffton Asters as well who agreed that CABG would be the best therapy.  However, the patient is adament that he would like to avoid surgery.  For questions or updates, please contact [facility] Please consult [email or web address] for contact info under        Signed, Lance Muss, MD  05/15/2019, 10:15 AM

## 2019-05-21 ENCOUNTER — Other Ambulatory Visit: Payer: Self-pay

## 2019-05-21 ENCOUNTER — Encounter (HOSPITAL_COMMUNITY): Payer: Self-pay | Admitting: Emergency Medicine

## 2019-05-21 ENCOUNTER — Inpatient Hospital Stay (HOSPITAL_COMMUNITY)
Admission: EM | Admit: 2019-05-21 | Discharge: 2019-06-01 | DRG: 235 | Disposition: A | Discharge status: Home Health | Payer: No Typology Code available for payment source | Attending: Surgery | Admitting: Surgery

## 2019-05-21 ENCOUNTER — Emergency Department (HOSPITAL_COMMUNITY): Payer: No Typology Code available for payment source

## 2019-05-21 DIAGNOSIS — J449 Chronic obstructive pulmonary disease, unspecified: Secondary | ICD-10-CM | POA: Diagnosis present

## 2019-05-21 DIAGNOSIS — I255 Ischemic cardiomyopathy: Secondary | ICD-10-CM | POA: Diagnosis present

## 2019-05-21 DIAGNOSIS — I252 Old myocardial infarction: Secondary | ICD-10-CM

## 2019-05-21 DIAGNOSIS — F17210 Nicotine dependence, cigarettes, uncomplicated: Secondary | ICD-10-CM | POA: Diagnosis present

## 2019-05-21 DIAGNOSIS — J440 Chronic obstructive pulmonary disease with acute lower respiratory infection: Secondary | ICD-10-CM | POA: Diagnosis not present

## 2019-05-21 DIAGNOSIS — I25110 Atherosclerotic heart disease of native coronary artery with unstable angina pectoris: Secondary | ICD-10-CM | POA: Diagnosis present

## 2019-05-21 DIAGNOSIS — Z79890 Hormone replacement therapy (postmenopausal): Secondary | ICD-10-CM

## 2019-05-21 DIAGNOSIS — I509 Heart failure, unspecified: Secondary | ICD-10-CM

## 2019-05-21 DIAGNOSIS — T82855A Stenosis of coronary artery stent, initial encounter: Secondary | ICD-10-CM | POA: Diagnosis present

## 2019-05-21 DIAGNOSIS — I440 Atrioventricular block, first degree: Secondary | ICD-10-CM | POA: Diagnosis not present

## 2019-05-21 DIAGNOSIS — Z6832 Body mass index (BMI) 32.0-32.9, adult: Secondary | ICD-10-CM

## 2019-05-21 DIAGNOSIS — Z7982 Long term (current) use of aspirin: Secondary | ICD-10-CM

## 2019-05-21 DIAGNOSIS — Z888 Allergy status to other drugs, medicaments and biological substances status: Secondary | ICD-10-CM

## 2019-05-21 DIAGNOSIS — E785 Hyperlipidemia, unspecified: Secondary | ICD-10-CM | POA: Diagnosis present

## 2019-05-21 DIAGNOSIS — I4819 Other persistent atrial fibrillation: Secondary | ICD-10-CM | POA: Diagnosis present

## 2019-05-21 DIAGNOSIS — J14 Pneumonia due to Hemophilus influenzae: Secondary | ICD-10-CM | POA: Diagnosis not present

## 2019-05-21 DIAGNOSIS — E1142 Type 2 diabetes mellitus with diabetic polyneuropathy: Secondary | ICD-10-CM | POA: Diagnosis present

## 2019-05-21 DIAGNOSIS — Z9981 Dependence on supplemental oxygen: Secondary | ICD-10-CM

## 2019-05-21 DIAGNOSIS — I200 Unstable angina: Secondary | ICD-10-CM

## 2019-05-21 DIAGNOSIS — Z01810 Encounter for preprocedural cardiovascular examination: Secondary | ICD-10-CM | POA: Diagnosis not present

## 2019-05-21 DIAGNOSIS — I480 Paroxysmal atrial fibrillation: Secondary | ICD-10-CM | POA: Diagnosis not present

## 2019-05-21 DIAGNOSIS — Z951 Presence of aortocoronary bypass graft: Secondary | ICD-10-CM

## 2019-05-21 DIAGNOSIS — Z86711 Personal history of pulmonary embolism: Secondary | ICD-10-CM

## 2019-05-21 DIAGNOSIS — I951 Orthostatic hypotension: Secondary | ICD-10-CM | POA: Diagnosis not present

## 2019-05-21 DIAGNOSIS — D62 Acute posthemorrhagic anemia: Secondary | ICD-10-CM | POA: Diagnosis not present

## 2019-05-21 DIAGNOSIS — I472 Ventricular tachycardia, unspecified: Secondary | ICD-10-CM

## 2019-05-21 DIAGNOSIS — E873 Alkalosis: Secondary | ICD-10-CM | POA: Diagnosis not present

## 2019-05-21 DIAGNOSIS — I272 Pulmonary hypertension, unspecified: Secondary | ICD-10-CM | POA: Diagnosis present

## 2019-05-21 DIAGNOSIS — Z7901 Long term (current) use of anticoagulants: Secondary | ICD-10-CM

## 2019-05-21 DIAGNOSIS — I340 Nonrheumatic mitral (valve) insufficiency: Secondary | ICD-10-CM | POA: Diagnosis present

## 2019-05-21 DIAGNOSIS — Z79899 Other long term (current) drug therapy: Secondary | ICD-10-CM

## 2019-05-21 DIAGNOSIS — Y831 Surgical operation with implant of artificial internal device as the cause of abnormal reaction of the patient, or of later complication, without mention of misadventure at the time of the procedure: Secondary | ICD-10-CM | POA: Diagnosis present

## 2019-05-21 DIAGNOSIS — E669 Obesity, unspecified: Secondary | ICD-10-CM | POA: Diagnosis present

## 2019-05-21 DIAGNOSIS — I2510 Atherosclerotic heart disease of native coronary artery without angina pectoris: Secondary | ICD-10-CM | POA: Diagnosis not present

## 2019-05-21 DIAGNOSIS — K219 Gastro-esophageal reflux disease without esophagitis: Secondary | ICD-10-CM | POA: Diagnosis present

## 2019-05-21 DIAGNOSIS — J438 Other emphysema: Secondary | ICD-10-CM

## 2019-05-21 DIAGNOSIS — I5043 Acute on chronic combined systolic (congestive) and diastolic (congestive) heart failure: Secondary | ICD-10-CM | POA: Diagnosis present

## 2019-05-21 DIAGNOSIS — E039 Hypothyroidism, unspecified: Secondary | ICD-10-CM | POA: Diagnosis present

## 2019-05-21 DIAGNOSIS — Z8249 Family history of ischemic heart disease and other diseases of the circulatory system: Secondary | ICD-10-CM

## 2019-05-21 DIAGNOSIS — D696 Thrombocytopenia, unspecified: Secondary | ICD-10-CM | POA: Diagnosis present

## 2019-05-21 DIAGNOSIS — J9622 Acute and chronic respiratory failure with hypercapnia: Secondary | ICD-10-CM | POA: Diagnosis not present

## 2019-05-21 DIAGNOSIS — G4733 Obstructive sleep apnea (adult) (pediatric): Secondary | ICD-10-CM | POA: Diagnosis present

## 2019-05-21 DIAGNOSIS — Z20822 Contact with and (suspected) exposure to covid-19: Secondary | ICD-10-CM | POA: Diagnosis present

## 2019-05-21 DIAGNOSIS — Z95810 Presence of automatic (implantable) cardiac defibrillator: Secondary | ICD-10-CM

## 2019-05-21 LAB — I-STAT CHEM 8, ED
BUN: 12 mg/dL (ref 8–23)
Calcium, Ion: 0.89 mmol/L — CL (ref 1.15–1.40)
Chloride: 108 mmol/L (ref 98–111)
Creatinine, Ser: 0.8 mg/dL (ref 0.61–1.24)
Glucose, Bld: 83 mg/dL (ref 70–99)
HCT: 39 % (ref 39.0–52.0)
Hemoglobin: 13.3 g/dL (ref 13.0–17.0)
Potassium: 4.7 mmol/L (ref 3.5–5.1)
Sodium: 140 mmol/L (ref 135–145)
TCO2: 24 mmol/L (ref 22–32)

## 2019-05-21 LAB — PROTIME-INR
INR: 1 (ref 0.8–1.2)
Prothrombin Time: 13 seconds (ref 11.4–15.2)

## 2019-05-21 LAB — CBC WITH DIFFERENTIAL/PLATELET
Abs Immature Granulocytes: 0.05 10*3/uL (ref 0.00–0.07)
Basophils Absolute: 0.1 10*3/uL (ref 0.0–0.1)
Basophils Relative: 1 %
Eosinophils Absolute: 0.1 10*3/uL (ref 0.0–0.5)
Eosinophils Relative: 1 %
HCT: 50.7 % (ref 39.0–52.0)
Hemoglobin: 16.6 g/dL (ref 13.0–17.0)
Immature Granulocytes: 1 %
Lymphocytes Relative: 33 %
Lymphs Abs: 2.6 10*3/uL (ref 0.7–4.0)
MCH: 31.2 pg (ref 26.0–34.0)
MCHC: 32.7 g/dL (ref 30.0–36.0)
MCV: 95.3 fL (ref 80.0–100.0)
Monocytes Absolute: 0.8 10*3/uL (ref 0.1–1.0)
Monocytes Relative: 9 %
Neutro Abs: 4.6 10*3/uL (ref 1.7–7.7)
Neutrophils Relative %: 55 %
Platelets: 139 10*3/uL — ABNORMAL LOW (ref 150–400)
RBC: 5.32 MIL/uL (ref 4.22–5.81)
RDW: 14.7 % (ref 11.5–15.5)
WBC: 8.1 10*3/uL (ref 4.0–10.5)
nRBC: 0 % (ref 0.0–0.2)

## 2019-05-21 LAB — RESPIRATORY PANEL BY RT PCR (FLU A&B, COVID)
Influenza A by PCR: NEGATIVE
Influenza B by PCR: NEGATIVE
SARS Coronavirus 2 by RT PCR: NEGATIVE

## 2019-05-21 LAB — MAGNESIUM: Magnesium: 2 mg/dL (ref 1.7–2.4)

## 2019-05-21 LAB — APTT: aPTT: 82 seconds — ABNORMAL HIGH (ref 24–36)

## 2019-05-21 LAB — TROPONIN I (HIGH SENSITIVITY)
Troponin I (High Sensitivity): 8 ng/L (ref <18)
Troponin I (High Sensitivity): 8 ng/L (ref <18)

## 2019-05-21 LAB — HEPARIN LEVEL (UNFRACTIONATED): Heparin Unfractionated: 1.74 IU/mL — ABNORMAL HIGH (ref 0.30–0.70)

## 2019-05-21 LAB — BRAIN NATRIURETIC PEPTIDE: B Natriuretic Peptide: 122.3 pg/mL — ABNORMAL HIGH (ref 0.0–100.0)

## 2019-05-21 MED ORDER — SODIUM CHLORIDE 0.9 % IV SOLN: 250.0000 mL | INTRAVENOUS | Status: DC | PRN

## 2019-05-21 MED ORDER — ASPIRIN 81 MG PO CHEW: 324.0000 mg | CHEWABLE_TABLET | Freq: Once | ORAL | Status: DC

## 2019-05-21 MED ORDER — RANOLAZINE ER 500 MG PO TB12
500.0000 mg | ORAL_TABLET | Freq: Two times a day (BID) | ORAL | Status: DC
  Administered 2019-05-21 – 2019-05-22 (×4): 500 mg via ORAL
  Filled 2019-05-21 (×5): qty 1

## 2019-05-21 MED ORDER — LEVOTHYROXINE SODIUM 75 MCG PO TABS
75.0000 ug | ORAL_TABLET | Freq: Every day | ORAL | Status: DC
  Administered 2019-05-21 – 2019-06-01 (×11): 75 ug via ORAL
  Filled 2019-05-21 (×11): qty 1

## 2019-05-21 MED ORDER — ACETAMINOPHEN 325 MG PO TABS: 650.0000 mg | ORAL_TABLET | ORAL | Status: DC | PRN

## 2019-05-21 MED ORDER — ATORVASTATIN CALCIUM 80 MG PO TABS
80.0000 mg | ORAL_TABLET | Freq: Every day | ORAL | Status: DC
  Administered 2019-05-21 – 2019-06-01 (×11): 80 mg via ORAL
  Filled 2019-05-21 (×12): qty 1

## 2019-05-21 MED ORDER — MONTELUKAST SODIUM 10 MG PO TABS
10.0000 mg | ORAL_TABLET | Freq: Two times a day (BID) | ORAL | Status: DC
  Administered 2019-05-21 – 2019-06-01 (×21): 10 mg via ORAL
  Filled 2019-05-21 (×21): qty 1

## 2019-05-21 MED ORDER — NITROGLYCERIN 0.4 MG SL SUBL: 0.4000 mg | SUBLINGUAL_TABLET | SUBLINGUAL | Status: DC | PRN

## 2019-05-21 MED ORDER — ALPRAZOLAM 0.25 MG PO TABS
0.2500 mg | ORAL_TABLET | Freq: Two times a day (BID) | ORAL | Status: DC | PRN
  Administered 2019-05-28: 0.25 mg via ORAL
  Filled 2019-05-21: qty 1

## 2019-05-21 MED ORDER — BUDESONIDE 0.25 MG/2ML IN SUSP
0.2500 mg | Freq: Two times a day (BID) | RESPIRATORY_TRACT | Status: DC
  Administered 2019-05-21: 0.25 mg via RESPIRATORY_TRACT
  Filled 2019-05-21: qty 2

## 2019-05-21 MED ORDER — UMECLIDINIUM-VILANTEROL 62.5-25 MCG/INH IN AEPB
1.0000 | INHALATION_SPRAY | Freq: Every day | RESPIRATORY_TRACT | Status: DC
  Administered 2019-05-22 – 2019-06-01 (×9): 1 via RESPIRATORY_TRACT
  Filled 2019-05-21 (×2): qty 14

## 2019-05-21 MED ORDER — ASPIRIN 81 MG PO CHEW
81.0000 mg | CHEWABLE_TABLET | Freq: Every day | ORAL | Status: DC
  Administered 2019-05-22: 81 mg via ORAL
  Filled 2019-05-21: qty 1

## 2019-05-21 MED ORDER — PROPYLENE GLYCOL 0.6 % OP SOLN: 1.0000 [drp] | OPHTHALMIC | Status: DC

## 2019-05-21 MED ORDER — METOPROLOL SUCCINATE ER 50 MG PO TB24
50.0000 mg | ORAL_TABLET | Freq: Two times a day (BID) | ORAL | Status: DC
  Administered 2019-05-21 – 2019-05-22 (×4): 50 mg via ORAL
  Filled 2019-05-21 (×4): qty 1

## 2019-05-21 MED ORDER — NON FORMULARY: 2.0000 | Freq: Every day | Status: DC

## 2019-05-21 MED ORDER — NITROGLYCERIN 2 % TD OINT
0.5000 [in_us] | TOPICAL_OINTMENT | Freq: Four times a day (QID) | TRANSDERMAL | Status: DC
  Administered 2019-05-21 – 2019-05-23 (×8): 0.5 [in_us] via TOPICAL
  Filled 2019-05-21: qty 30

## 2019-05-21 MED ORDER — LEVALBUTEROL HCL 0.63 MG/3ML IN NEBU: 0.6300 mg | INHALATION_SOLUTION | Freq: Four times a day (QID) | RESPIRATORY_TRACT | Status: DC | PRN

## 2019-05-21 MED ORDER — OMEGA-3-ACID ETHYL ESTERS 1 G PO CAPS
1.0000 g | ORAL_CAPSULE | Freq: Two times a day (BID) | ORAL | Status: DC
  Administered 2019-05-21 – 2019-06-01 (×21): 1 g via ORAL
  Filled 2019-05-21 (×21): qty 1

## 2019-05-21 MED ORDER — POLYVINYL ALCOHOL 1.4 % OP SOLN
1.0000 [drp] | Freq: Every day | OPHTHALMIC | Status: DC
  Administered 2019-05-21 – 2019-05-31 (×8): 1 [drp] via OPHTHALMIC
  Filled 2019-05-21 (×2): qty 15

## 2019-05-21 MED ORDER — BUDESONIDE 0.25 MG/2ML IN SUSP
0.2500 mg | Freq: Two times a day (BID) | RESPIRATORY_TRACT | Status: DC
  Administered 2019-05-22 – 2019-06-01 (×20): 0.25 mg via RESPIRATORY_TRACT
  Filled 2019-05-21 (×20): qty 2

## 2019-05-21 MED ORDER — HEPARIN (PORCINE) 25000 UT/250ML-% IV SOLN
1150.0000 [IU]/h | INTRAVENOUS | Status: DC
  Administered 2019-05-21 – 2019-05-23 (×3): 1150 [IU]/h via INTRAVENOUS
  Filled 2019-05-21 (×3): qty 250

## 2019-05-21 MED ORDER — ADULT MULTIVITAMIN W/MINERALS CH
1.0000 | ORAL_TABLET | Freq: Every day | ORAL | Status: DC
  Administered 2019-05-21 – 2019-06-01 (×11): 1 via ORAL
  Filled 2019-05-21 (×11): qty 1

## 2019-05-21 MED ORDER — AMIODARONE HCL 200 MG PO TABS
400.0000 mg | ORAL_TABLET | Freq: Every day | ORAL | Status: DC
  Administered 2019-05-21 – 2019-05-22 (×2): 400 mg via ORAL
  Filled 2019-05-21 (×2): qty 2

## 2019-05-21 MED ORDER — PANTOPRAZOLE SODIUM 40 MG PO TBEC
40.0000 mg | DELAYED_RELEASE_TABLET | Freq: Two times a day (BID) | ORAL | Status: DC
  Administered 2019-05-21 – 2019-05-22 (×3): 40 mg via ORAL
  Filled 2019-05-21 (×4): qty 1

## 2019-05-21 MED ORDER — ONDANSETRON HCL 4 MG/2ML IJ SOLN: 4.0000 mg | Freq: Four times a day (QID) | INTRAMUSCULAR | Status: DC | PRN

## 2019-05-21 MED ORDER — SODIUM CHLORIDE 0.9% FLUSH: 3.0000 mL | INTRAVENOUS | Status: DC | PRN

## 2019-05-21 MED ORDER — SODIUM CHLORIDE 0.9% FLUSH
3.0000 mL | Freq: Two times a day (BID) | INTRAVENOUS | Status: DC
  Administered 2019-05-21 – 2019-05-22 (×2): 3 mL via INTRAVENOUS

## 2019-05-21 MED ORDER — NITROGLYCERIN 2 % TD OINT
0.5000 [in_us] | TOPICAL_OINTMENT | Freq: Once | TRANSDERMAL | Status: AC
  Administered 2019-05-21: 0.5 [in_us] via TOPICAL
  Filled 2019-05-21: qty 1

## 2019-05-21 MED ORDER — ZOLPIDEM TARTRATE 5 MG PO TABS: 5.0000 mg | ORAL_TABLET | Freq: Every evening | ORAL | Status: DC | PRN

## 2019-05-21 NOTE — ED Notes (Signed)
Attempted to contact lab regarding troponin that has not yet resulted, unable to make contact with anyone in main lab at this time.

## 2019-05-21 NOTE — Progress Notes (Signed)
Patient transferred from ED at 1245hrs.  Oriented to unit and plan of care for shift, verbalized understanding.  Chest pain free on arrival to unit.

## 2019-05-21 NOTE — ED Triage Notes (Signed)
Pt arrived via [facility] stretcher c/o CP that caused him to awaken from sleep at approx 0411. Pt was reportedly having cool/clammy skin and felt like he had indigestion. Pt took two SL nitro. Pt was seen last week for a heart cath, and was recommended for a CABG, but requested a second opinion. Pt is A+Ox4 on arrival to room c/o dull CP 3/10 radiating to L arm.

## 2019-05-21 NOTE — Progress Notes (Addendum)
ANTICOAGULATION CONSULT NOTE  Pharmacy Consult for Heparin Indication: chest pain/ACS  Allergies  Allergen Reactions  . Crestor [Rosuvastatin Calcium] Other (See Comments)    Back spasms, but can tolerate it at a low dose now    Patient Measurements: Height: 5\' 11"  (180.3 cm) Weight: 105.9 kg (233 lb 8 oz) IBW/kg (Calculated) : 75.3 Heparin Dosing Weight: 98 kg  Vital Signs: Temp: 97.7 F (36.5 C) (05/09 1616) Temp Source: Oral (05/09 1616) BP: 105/62 (05/09 1616) Pulse Rate: 55 (05/09 1616)  Labs: Recent Labs    05/21/19 0526 05/21/19 0550 05/21/19 0703 05/21/19 1624  HGB 16.6 13.3  --   --   HCT 50.7 39.0  --   --   PLT 139*  --   --   --   APTT  --   --   --  82*  LABPROT 13.0  --   --   --   INR 1.0  --   --   --   CREATININE  --  0.80  --   --   TROPONINIHS 8  --  8  --     Estimated Creatinine Clearance: 110.9 mL/min (by C-G formula based on SCr of 0.8 mg/dL).   Medical History: Past Medical History:  Diagnosis Date  . Arthritis    "elbows, knees" (02/24/2016)  . Bronchitis 2006  . CHF (congestive heart failure) (HCC)   . COPD (chronic obstructive pulmonary disease) (HCC)   . Coronary artery disease   . GERD (gastroesophageal reflux disease)   . High cholesterol   . Myocardial infarction [facility]) ~ 1995 X 2;1998; 2000; 2004  . On home oxygen therapy    "2L w/activity" (02/24/2016)  . Pacemaker   . Pulmonary embolism (HCC) 02/24/2016  . Sleep apnea    "have CPAP; can't tolerate it" (02/24/2016)  . Unstable angina (HCC) 05/21/2019    Medications:  Scheduled:  . amiodarone  400 mg Oral Daily  . aspirin  81 mg Oral Daily  . atorvastatin  80 mg Oral Daily  . budesonide  0.25 mg Inhalation BID  . levothyroxine  75 mcg Oral Q0600  . metoprolol succinate  50 mg Oral BID  . montelukast  10 mg Oral BID  . multivitamin with minerals  1 tablet Oral Daily  . nitroGLYCERIN  0.5 inch Topical Q6H  . omega-3 acid ethyl esters  1 g Oral BID  . pantoprazole  40  mg Oral BID AC  . polyvinyl alcohol  1 drop Both Eyes QHS  . ranolazine  500 mg Oral BID  . sodium chloride flush  3 mL Intravenous Q12H  . umeclidinium-vilanterol  1 puff Inhalation Daily    Assessment: Patient is a 3 yom that presents to the ED with Chest pain. The patient was recently admitted for VT and was started on Amio and Apixaban. Pharmacy has been asked to dose heparin for ACS at this time.   Initial aPTT is therapeutic at 82 seconds drawn after ~4h.  Goal of Therapy:  aPTT 66-102 seconds Monitor platelets by anticoagulation protocol: Yes   Plan:  -Continue heparin 1150 units/h -Confirmatory aPTT tomorrow morning -Daily heparin level until correlating with aPTT   81, PharmD, BCPS Clinical Pharmacist [phone] Please check AMION for all [facility] Pharmacy numbers 05/21/2019

## 2019-05-21 NOTE — H&P (Signed)
Cardiology Admission History and Physical:   Patient ID: Max Rivas; MRN: [id number]; DOB: Jul 14, 1950   Admission date: 05/21/2019  Primary Care Provider: Anson Fret, MD Primary Cardiologist: Jillyn Hidden at the [facility], Dr Katrinka Blazing 05/12/2019 Primary Electrophysiologist: Lewayne Bunting, MD  05/15/2019  Chief Complaint:  Chest pain  Patient Profile:   Max Rivas is a 69 y.o. male with a history of CAD, (multiple prior MI's multiple stents, lastheart cath was 2004 at [facility]. 3 stents were placed at that time), ICM, chronic CHF (systolic), ICD with appropriate shocks for VT, atrial fibrillation, COPD, HLD, OSA intolerant of CPAP.  History of Present Illness:   Mr. Mandeville was admitted 4/29-05/18/2019 for VT, cath with significant CAD (but patient refused CABG) and CHF 40-45% and grade 2 diastolic dysfunction.  He was restarted on amiodarone for the VT and may need mexiletine if amiodarone does not control the VT once fully loaded.  He remained in sinus rhythm during his hospital stay.  Results of ICD interrogation, cath and echo are below.  [facility].  Mr. Wilmes was initially doing well after discharge.  However, he was awakened this a.m. by chest pain, sometime between 3 and 4 AM.  It was a pressure, reached a 9/10.  It radiated down his left arm and he had some numbness and tingling in his fingertips.  He had shortness of breath with it and was a little diaphoretic.  He took sublingual nitroglycerin x2 which helped the pain some but did not relieve it.  He was very worried about the pain and called 911.  He has had 324 mg of aspirin, and nitroglycerin paste.  He is currently pain-free.  He has not been wheezing, has not had shortness of breath above his baseline, and has not had palpitations or ICD shocks.   Past Medical History:  Diagnosis Date  . Arthritis    "elbows, knees" (02/24/2016)  . Bronchitis 2006  . CHF (congestive  heart failure) (HCC)   . COPD (chronic obstructive pulmonary disease) (HCC)   . Coronary artery disease   . GERD (gastroesophageal reflux disease)   . High cholesterol   . Myocardial infarction [facility]) ~ 1995 X 2;1998; 2000; 2004  . On home oxygen therapy    "2L w/activity" (02/24/2016)  . Pacemaker   . Pulmonary embolism (HCC) 02/24/2016  . Sleep apnea    "have CPAP; can't tolerate it" (02/24/2016)  . Unstable angina (HCC) 05/21/2019    Past Surgical History:  Procedure Laterality Date  . CORONARY ANGIOPLASTY  1995  . CORONARY ANGIOPLASTY WITH STENT PLACEMENT  ~ 1995 - 2004 X 5   "I've got a total of 5 stents" (02/24/2016)  . 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 INTRAVASCULAR PRESSURE WIRE/FFR STUDY N/A 05/12/2019   Procedure: INTRAVASCULAR PRESSURE WIRE/FFR STUDY;  Surgeon: Lyn Records, MD;  Location: [facility] INVASIVE CV LAB;  Service: Cardiovascular;  Laterality: N/A;  . LEFT HEART CATH AND CORONARY ANGIOGRAPHY N/A 05/12/2019   Procedure: LEFT HEART CATH AND CORONARY ANGIOGRAPHY;  Surgeon: Lyn Records, MD;  Location: [facility] INVASIVE CV LAB;  Service: Cardiovascular;  Laterality: N/A;  . PACEMAKER GENERATOR CHANGE  03/2011    [facility]  . TONSILLECTOMY       Medications Prior to Admission: Prior to Admission medications   Medication Sig Start Date End Date  Taking? Authorizing Provider  acetaminophen (TYLENOL) 325 MG tablet Take 2 tablets (650 mg total) by mouth every 6 (six) hours as needed for mild pain (or Fever >/= 101). 03/05/16  Yes Alison Murray, MD  amiodarone (PACERONE) 400 MG tablet Take 1 tablet (400 mg total) by mouth 2 (two) times daily for 4 days, THEN 1 tablet (400 mg total) daily for 7 days, THEN 0.5 tablets (200 mg total) daily. 05/15/19 05/10/20 Yes Kroeger, Ovidio Kin., PA-C  apixaban (ELIQUIS) 5 MG TABS tablet Take 5 mg by mouth 2 (two) times daily.   Yes [provider]  aspirin 81 MG chewable tablet Chew 1  tablet (81 mg total) by mouth daily. 05/16/19  Yes Kroeger, Dot Lanes M., PA-C  atorvastatin (LIPITOR) 80 MG tablet Take 1 tablet (80 mg total) by mouth daily. 05/15/19  Yes Kroeger, Dot Lanes M., PA-C  carboxymethylcellulose (REFRESH PLUS) 0.5 % SOLN Place 1 drop into both eyes at bedtime.   Yes [provider]  Cholecalciferol (VITAMIN D3) 50 MCG (2000 UT) TABS Take 2,000 Units by mouth daily.   Yes [provider]  furosemide (LASIX) 40 MG tablet Take 1 tablet (40 mg total) by mouth as needed (for swelling and/or shortness of breath). If lower extremity edema take 1 additional tablet 05/15/19 05/14/20 Yes Kroeger, Dot Lanes M., PA-C  levalbuterol [facility]) 45 MCG/ACT inhaler Inhale 2 puffs into the lungs every 6 (six) hours as needed for wheezing or shortness of breath.   Yes [provider]  levothyroxine (SYNTHROID) 75 MCG tablet Take 75 mcg by mouth daily before breakfast.   Yes [provider]  metoprolol succinate (TOPROL-XL) 50 MG 24 hr tablet Take 1 tablet (50 mg total) by mouth 2 (two) times daily. 05/15/19  Yes Kroeger, Dot Lanes M., PA-C  mometasone [facility]) 220 MCG/INH inhaler Inhale 2 puffs into the lungs 2 (two) times daily.    Yes [provider]  montelukast (SINGULAIR) 10 MG tablet Take 10 mg by mouth 2 (two) times daily.    Yes [provider]  Multiple Vitamin (MULTIVITAMIN WITH MINERALS) TABS tablet Take 1 tablet by mouth daily.   Yes [provider]  nitroGLYCERIN (NITROSTAT) 0.4 MG SL tablet Place 0.4 mg under the tongue every 5 (five) minutes as needed for chest pain.    Yes [provider]  omega-3 acid ethyl esters (LOVAZA) 1 g capsule Take 1 capsule (1 g total) by mouth 2 (two) times daily. 03/05/16  Yes Alison Murray, MD  omeprazole (PRILOSEC) 20 MG capsule Take 20 mg by mouth 2 (two) times daily before a meal.   Yes [provider]  Propylene Glycol (SYSTANE BALANCE) 0.6 % SOLN Place 1 drop into both eyes See  admin instructions. Place 1 drop into each eye four times a day and apply a warm compress for 10 minutes afterwards   Yes [provider]  ranolazine (RANEXA) 500 MG 12 hr tablet Take 1 tablet (500 mg total) by mouth 2 (two) times daily. 05/15/19  Yes Kroeger, Dot Lanes M., PA-C  Tiotropium Bromide-Olodaterol (STIOLTO RESPIMAT) 2.5-2.5 MCG/ACT AERS Inhale 2 puffs into the lungs daily.    Yes [provider]     Allergies:    Allergies  Allergen Reactions  . Crestor [Rosuvastatin Calcium] Other (See Comments)    Back spasms, but can tolerate it at a low dose now    Social History:   Social History   Socioeconomic History  . Marital status: Divorced  Spouse name: Not on file  . Number of children: Not on file  . Years of education: Not on file  . Highest education level: Not on file  Occupational History  . Not on file  Tobacco Use  . Smoking status: Current Some Day Smoker    Packs/day: 3.00    Years: 35.00    Pack years: 105.00    Types: Cigarettes  . Smokeless tobacco: Never Used  . Tobacco comment: 02/24/2016 "stopped ~ 8-9 months ago; have had 5 cigarettes in the last week"  Substance and Sexual Activity  . Alcohol use: Yes    Comment: 02/24/2016 "none in a long time"  . Drug use: Not Currently    Types: Cocaine    Comment: 02/24/2016 "none since 1995"  . Sexual activity: Yes  Other Topics Concern  . Not on file  Social History Narrative  . Not on file   Social Determinants of Health   Financial Resource Strain:   . Difficulty of Paying Living Expenses:   Food Insecurity:   . Worried About Programme researcher, broadcasting/film/video in the Last Year:   . Barista in the Last Year:   Transportation Needs:   . Freight forwarder (Medical):   Marland Kitchen Lack of Transportation (Non-Medical):   Physical Activity:   . Days of Exercise per Week:   . Minutes of Exercise per Session:   Stress:   . Feeling of Stress :   Social Connections:   . Frequency of Communication with  Friends and Family:   . Frequency of Social Gatherings with Friends and Family:   . Attends Religious Services:   . Active Member of Clubs or Organizations:   . Attends Banker Meetings:   Marland Kitchen Marital Status:   Intimate Partner Violence:   . Fear of Current or Ex-Partner:   . Emotionally Abused:   Marland Kitchen Physically Abused:   . Sexually Abused:     Family History:  The patient's family history includes Heart attack in his father and mother; Heart attack (age of onset: 77) in his sister.   The patient He indicated that the status of his mother is unknown. He indicated that the status of his father is unknown. He indicated that the status of his sister is unknown.    ROS:  Please see the history of present illness.  All other ROS reviewed and negative.     Physical Exam/Data:   Vitals:   05/21/19 0530 05/21/19 0615 05/21/19 0715 05/21/19 0730  BP: (!) 122/55 114/64 (!) 109/59   Pulse: (!) 56 (!) 55 (!) 55 (!) 58  Resp: 19 12 15 18   Temp:      TempSrc:      SpO2: 96% 91% 92% 90%  Weight:      Height:       No intake or output data in the 24 hours ending 05/21/19 0919 Filed Weights   05/21/19 0521  Weight: 107 kg   Body mass index is 32.92 kg/m.  General:  Well nourished, well developed, male in no acute distress HEENT: normal Lymph: no adenopathy Neck:  JVD not elevated, but difficult to assess secondary to body habitus Endocrine:  No thryomegaly Vascular: No carotid bruits; 4/4 extremity pulses 2+ bilaterally  Cardiac:  normal S1, S2; RRR; no murmur, no rub or gallop  Lungs:  clear to auscultation bilaterally, no wheezing, rhonchi or rales  Abd: soft, nontender, no hepatomegaly  Ext: No edema Musculoskeletal:  No deformities,  BUE and BLE strength normal and equal Skin: warm and dry  Neuro:  CNs 2-12 intact, no focal abnormalities noted Psych:  Normal affect    EKG:  The ECG was personally reviewed: Sinus rhythm with a PVC, heart rate 62, ?LBBB with QRS  duration 137 ms Telemetry: SR, PVCs  Relevant CV Studies:  Device transmission from 05/10/2019 Battery status 2.63V (RRT is 2.63V, has not triggered RRT yet) Lead measurements are good OptiVol is well below threshold 05/09/2019: MMVT > ATP> accelerated to another MMVT in VF zone > 34.2J shock successful 05/09/2019 Monitored VT episodes 52 seconds 182bpm 05/01/2019: MMVT > ATP > a different MMVT > 34.3J shock successful  Left heart catheterization 05/12/19:  Recurrent, probably ischemically mediated ventricular tachycardia with multiple shocks.  High-grade native and diffuse in-stent restenosis ostial to mid circumflex 95 to 99%. There is also a 90% stenosis beyond the stented segment.  Widely patent left main  Eccentric bulky and calcified 50 to 65% proximal to mid LAD beyond the origin of the large first diagonal. Eccentric bulky and calcified ostial to proximal 70% first diagonal. Both LAD and diagonal are mildly hemodynamically significant based upon RFR values 0.89 and 0.87, respectively. RFR 0.89 or less is hemodynamically significant.  The right coronary particularly in the mid segment contains diffuse atherosclerosis but no focal high-grade obstruction.  Left ventriculography is not performed because of renal insufficiency. LVEDP is normal measuring 10 mmHg.  RECOMMENDATIONS:   We need to determine if the patient has any surgical options. The likelihood of durable circumflex result is low due to the diffuse nature of disease and the need to place stents over a long segment, most of which would be stent sandwich. The LAD and diagonal are mildly hemodynamically significant and could be grafted.  If PCI, will need orbital atherectomy over a long segment then reexpansion of the in-stent restenosis and if resistant, possibly shockwave PCI followed by extensive ostial to mid vessel restenting.  Echocardiogram 05/13/19: 1. Akinesis of the inferior and inferolateral walls with  overall mild to  moderate LV dysfunction; grade 2 diastolic dysfunction; mild MR; mild LAE.  2. Left ventricular ejection fraction, by estimation, is 40 to 45%. The  left ventricle has mildly decreased function. The left ventricle  demonstrates regional wall motion abnormalities (see scoring  diagram/findings for description). The left ventricular  internal cavity size was moderately dilated. Left ventricular diastolic  parameters are consistent with Grade II diastolic dysfunction  (pseudonormalization).  3. Right ventricular systolic function is normal. The right ventricular  size is normal.  4. Left atrial size was mildly dilated.  5. The mitral valve is normal in structure. Mild mitral valve  regurgitation. No evidence of mitral stenosis.  6. The aortic valve has an indeterminant number of cusps. Aortic valve  regurgitation is not visualized. No aortic stenosis is present.  7. The inferior vena cava is normal in size with greater than 50%  respiratory variability, suggesting right atrial pressure of 3 mmHg.   Laboratory Data:  Chemistry Recent Labs  Lab 05/15/19 0600 05/21/19 0550  NA 139 140  K 4.8 4.7  CL 104 108  CO2 27  --   GLUCOSE 96 83  BUN 14 12  CREATININE 1.45* 0.80  CALCIUM 8.8*  --   GFRNONAA 49*  --   GFRAA 57*  --   ANIONGAP 8  --     No results for input(s): PROT, ALBUMIN, AST, ALT, ALKPHOS, BILITOT in the last 168 hours. Hematology Recent  Labs  Lab 05/15/19 0600 05/15/19 0600 05/21/19 0526 05/21/19 0550  WBC 8.4  --  8.1  --   RBC 4.90  --  5.32  --   HGB 15.3   < > 16.6 13.3  HCT 46.3   < > 50.7 39.0  MCV 94.5  --  95.3  --   MCH 31.2  --  31.2  --   MCHC 33.0  --  32.7  --   RDW 14.6  --  14.7  --   PLT 120*  --  139*  --    < > = values in this interval not displayed.   Cardiac Enzymes  High Sensitivity Troponin:   Recent Labs  Lab 05/10/19 1329 05/11/19 1104 05/11/19 1330 05/21/19 0526 05/21/19 0703  TROPONINIHS 18* 14 14  8 8      BNP Recent Labs  Lab 05/21/19 0526  BNP 122.3*    Lipids:  Lab Results  Component Value Date   CHOL 126 05/13/2019   HDL 36 (L) 05/13/2019   LDLCALC 70 05/13/2019   TRIG 101 05/13/2019   CHOLHDL 3.5 05/13/2019   INR:  Lab Results  Component Value Date   INR 1.0 05/21/2019   A1c: No results found for: HGBA1C Thyroid:  Lab Results  Component Value Date   TSH 1.184 05/13/2019    Radiology/Studies:  DG Chest Portable 1 View  Result Date: 05/21/2019 CLINICAL DATA:  Chest pain EXAM: PORTABLE CHEST 1 VIEW COMPARISON:  05/11/2018 FINDINGS: LEFT-sided pacemaker overlies stable enlarged cardiac silhouette. There is bibasilar atelectasis and basilar pleural thickening (with calcifications) similar to comparison exam. No overt pulmonary edema or infiltrate. No pneumothorax. IMPRESSION: 1. No clear acute cardiopulmonary findings. 2. Bibasilar atelectasis and pleural thickening. Electronically Signed   By: Suzy Bouchard M.D.   On: 05/21/2019 05:54    Assessment and Plan:   1.  Unstable anginal pain -Admit, continue ASA, statin, Ranexa -Hold his Eliquis, start heparin -Contact the surgical team to see him -If he has recurrent pain, start IV nitro.  Otherwise, continue nitroglycerin paste  2.  PAF: -Currently maintaining sinus rhythm, continue amiodarone -CHA2DS2-VASc equals 5  (age x 1, CHF, CAD, PE x 2) -Eliquis is on hold, heparin per pharmacy  3.  COPD: -He is not currently wheezing, chest x-ray is okay and BNP is minimally elevated -Continue home therapies   Principal Problem:   Unstable angina (HCC) Active Problems:   COPD (chronic obstructive pulmonary disease) (HCC)   VT (ventricular tachycardia) ([place])     For questions or updates, please contact [facility] Please consult [email or web address] for contact info under Cardiology/STEMI.    SignedRosaria Ferries, PA-C  05/21/2019 9:19 AM

## 2019-05-21 NOTE — Progress Notes (Signed)
ANTICOAGULATION CONSULT NOTE  Pharmacy Consult for Heparin Indication: chest pain/ACS  Allergies  Allergen Reactions  . Crestor [Rosuvastatin Calcium] Other (See Comments)    Back spasms, but can tolerate it at a low dose now    Patient Measurements: Height: 5\' 11"  (180.3 cm) Weight: 107 kg (236 lb) IBW/kg (Calculated) : 75.3 Heparin Dosing Weight: 98 kg  Vital Signs: Temp: 97.8 F (36.6 C) (05/09 0520) Temp Source: Oral (05/09 0520) BP: 120/53 (05/09 0930) Pulse Rate: 51 (05/09 0930)  Labs: Recent Labs    05/21/19 0526 05/21/19 0550 05/21/19 0703  HGB 16.6 13.3  --   HCT 50.7 39.0  --   PLT 139*  --   --   LABPROT 13.0  --   --   INR 1.0  --   --   CREATININE  --  0.80  --   TROPONINIHS 8  --  8    Estimated Creatinine Clearance: 111.5 mL/min (by C-G formula based on SCr of 0.8 mg/dL).   Medical History: Past Medical History:  Diagnosis Date  . Arthritis    "elbows, knees" (02/24/2016)  . Bronchitis 2006  . CHF (congestive heart failure) (HCC)   . COPD (chronic obstructive pulmonary disease) (HCC)   . Coronary artery disease   . GERD (gastroesophageal reflux disease)   . High cholesterol   . Myocardial infarction [facility]) ~ 1995 X 2;1998; 2000; 2004  . On home oxygen therapy    "2L w/activity" (02/24/2016)  . Pacemaker   . Pulmonary embolism (HCC) 02/24/2016  . Sleep apnea    "have CPAP; can't tolerate it" (02/24/2016)  . Unstable angina (HCC) 05/21/2019    Medications:  Scheduled:  . amiodarone  400 mg Oral Daily  . aspirin  324 mg Oral Once  . aspirin  81 mg Oral Daily  . atorvastatin  80 mg Oral Daily  . budesonide  0.25 mg Inhalation BID  . levothyroxine  75 mcg Oral Q0600  . metoprolol succinate  50 mg Oral BID  . montelukast  10 mg Oral BID  . multivitamin with minerals  1 tablet Oral Daily  . nitroGLYCERIN  0.5 inch Topical Q6H  . omega-3 acid ethyl esters  1 g Oral BID  . pantoprazole  40 mg Oral BID AC  . polyvinyl alcohol  1 drop Both  Eyes QHS  . ranolazine  500 mg Oral BID  . sodium chloride flush  3 mL Intravenous Q12H  . umeclidinium-vilanterol  1 puff Inhalation Daily    Assessment: Patient is a 61 yom that presents to the ED with Chest pain. The patient was recently admitted for VT and was started on Amio and Apixaban. Pharmacy has been asked to dose heparin for ACS at this time.   Goal of Therapy:  aPTT 66-102 seconds Monitor platelets by anticoagulation protocol: Yes   Plan:  - No heparin bolus as pt took Apixaban on the evening of 5/8 - Start Heparin drip @ 1150 units/hr - Will monitor heparin with aPTT until heparin level and aPTT correlate  - Monitor patient for s/s of bleeding and CBC while on heparin   7/8 PharmD. BCPS  05/21/2019,11:12 AM

## 2019-05-21 NOTE — ED Provider Notes (Signed)
[name] EMERGENCY DEPARTMENT Provider Note   CSN: [id number] Arrival date & time: 05/21/19  0516     History Chief Complaint  Patient presents with  . Chest Pain    Max Rivas is a 69 y.o. male.  The history is provided by the patient.  Chest Pain Pain location:  Substernal area (radiates across sternal area ) Pain quality: dull   Pain radiates to:  Does not radiate Pain severity:  Moderate Onset quality:  Sudden Duration:  1 hour Timing:  Constant Progression:  Resolved Chronicity:  New Context: at rest   Relieved by:  Nothing Worsened by:  Nothing Ineffective treatments:  None tried Associated symptoms: diaphoresis and shortness of breath   Associated symptoms: no abdominal pain, no dizziness, no nausea and no vomiting   Risk factors: coronary artery disease, hypertension and male sex        Past Medical History:  Diagnosis Date  . Arthritis    "elbows, knees" (02/24/2016)  . Bronchitis 2006  . CHF (congestive heart failure) (HCC)   . COPD (chronic obstructive pulmonary disease) (HCC)   . Coronary artery disease   . GERD (gastroesophageal reflux disease)   . High cholesterol   . Myocardial infarction [facility]) ~ 1995 X 2;1998; 2000; 2004  . On home oxygen therapy    "2L w/activity" (02/24/2016)  . Pacemaker   . Pulmonary embolism (HCC) 02/24/2016  . Sleep apnea    "have CPAP; can't tolerate it" (02/24/2016)    Patient Active Problem List   Diagnosis Date Noted  . Ventricular tachycardia (HCC) 05/12/2019  . Chest pain   . VT (ventricular tachycardia) (HCC) 05/11/2019  . Arrhythmia 03/12/2019  . On home O2   . Chronic combined systolic and diastolic CHF (congestive heart failure) (HCC) 06/22/2018  . Chronic respiratory failure with hypoxia (HCC) 06/22/2018  . Congestive heart failure (CHF) (HCC) 06/22/2018  . DCM (dilated cardiomyopathy) (HCC) 05/25/2017  . Coronary artery disease 05/25/2017  . Chronic atrial fibrillation  05/25/2017  . COPD (chronic obstructive pulmonary disease) (HCC) 05/25/2017  . CKD (chronic kidney disease) 05/25/2017  . Hypothyroidism 05/25/2017  . 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 OSA (obstructive sleep apnea) 05/25/2017  . Pulmonary HTN (HCC) 05/25/2017  . Acute on chronic respiratory failure with hypoxia (HCC)   . Mediastinal adenopathy   . Cervical adenopathy   . Acute respiratory failure with hypoxia (HCC) 02/24/2016  . Pulmonary embolus (HCC) 02/24/2016  . Pulmonary artery thrombosis (HCC)   . Pleural effusion   . Hypoxemia   . Lung mass   . Pleural plaque   . Flutter-fibrillation (HCC) 01/15/2016  . Acute systolic congestive heart failure (HCC)   . Atrial fibrillation with RVR (HCC)   . Hypercholesterolemia 03/11/2006  . Tobacco abuse 03/11/2006  . Acute on chronic systolic congestive heart failure (HCC) 03/11/2006  . GASTROESOPHAGEAL REFLUX, NO ESOPHAGITIS 03/11/2006  . OSTEOARTHRITIS, MULTI SITES 03/11/2006  . HIGH RISK PATIENT 03/11/2006  . Tobacco dependence 03/11/2006    Past Surgical History:  Procedure Laterality Date  . CORONARY ANGIOPLASTY  1995  . CORONARY ANGIOPLASTY WITH STENT PLACEMENT  ~ 1995 - 2004 X 5   "I've got a total of 5 stents" (02/24/2016)  . 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 INTRAVASCULAR PRESSURE WIRE/FFR STUDY N/A 05/12/2019   Procedure: INTRAVASCULAR PRESSURE WIRE/FFR STUDY;  Surgeon: Lyn Records, MD;  Location:  [facility] INVASIVE CV LAB;  Service: Cardiovascular;  Laterality: N/A;  . LEFT HEART CATH AND CORONARY ANGIOGRAPHY N/A 05/12/2019   Procedure: LEFT HEART CATH AND CORONARY ANGIOGRAPHY;  Surgeon: Lyn Records, MD;  Location: [facility] INVASIVE CV LAB;  Service: Cardiovascular;  Laterality: N/A;  . PACEMAKER GENERATOR CHANGE  03/2011    [facility]  . TONSILLECTOMY         Family History  Problem Relation Age of Onset  . Heart attack Mother    . Heart attack Father   . Heart attack Sister 26    Social History   Tobacco Use  . Smoking status: Current Some Day Smoker    Packs/day: 3.00    Years: 35.00    Pack years: 105.00    Types: Cigarettes  . Smokeless tobacco: Never Used  . Tobacco comment: 02/24/2016 "stopped ~ 8-9 months ago; have had 5 cigarettes in the last week"  Substance Use Topics  . Alcohol use: Yes    Comment: 02/24/2016 "none in a long time"  . Drug use: Not Currently    Types: Cocaine    Comment: 02/24/2016 "none since 1995"    Home Medications Prior to Admission medications   Medication Sig Start Date End Date Taking? Authorizing Provider  acetaminophen (TYLENOL) 325 MG tablet Take 2 tablets (650 mg total) by mouth every 6 (six) hours as needed for mild pain (or Fever >/= 101). 03/05/16   Alison Murray, MD  amiodarone (PACERONE) 400 MG tablet Take 1 tablet (400 mg total) by mouth 2 (two) times daily for 4 days, THEN 1 tablet (400 mg total) daily for 7 days, THEN 0.5 tablets (200 mg total) daily. 05/15/19 05/10/20  Kroeger, Ovidio Kin., PA-C  apixaban (ELIQUIS) 5 MG TABS tablet Take 5 mg by mouth 2 (two) times daily.    [provider]  aspirin 81 MG chewable tablet Chew 1 tablet (81 mg total) by mouth daily. 05/16/19   Kroeger, Ovidio Kin., PA-C  atorvastatin (LIPITOR) 80 MG tablet Take 1 tablet (80 mg total) by mouth daily. 05/15/19   Kroeger, Ovidio Kin., PA-C  carboxymethylcellulose (REFRESH PLUS) 0.5 % SOLN Place 1 drop into both eyes at bedtime.    [provider]  Cholecalciferol (VITAMIN D3) 50 MCG (2000 UT) TABS Take 2,000 Units by mouth daily.    [provider]  furosemide (LASIX) 40 MG tablet Take 1 tablet (40 mg total) by mouth as needed (for swelling and/or shortness of breath). If lower extremity edema take 1 additional tablet 05/15/19 05/14/20  Kroeger, Dot Lanes M., PA-C  levalbuterol [facility] HFA) 45 MCG/ACT inhaler Inhale 2 puffs into the lungs every 6 (six) hours as needed for  wheezing or shortness of breath.    [provider]  levothyroxine (SYNTHROID) 75 MCG tablet Take 75 mcg by mouth daily before breakfast.    [provider]  metoprolol succinate (TOPROL-XL) 50 MG 24 hr tablet Take 1 tablet (50 mg total) by mouth 2 (two) times daily. 05/15/19   Kroeger, Ovidio Kin., PA-C  mometasone [facility]) 220 MCG/INH inhaler Inhale 2 puffs into the lungs 2 (two) times daily.     [provider]  montelukast (SINGULAIR) 10 MG tablet Take 10 mg by mouth 2 (two) times daily.     [provider]  Multiple Vitamin (MULTIVITAMIN WITH MINERALS) TABS tablet Take 1 tablet by mouth daily.    [provider]  nitroGLYCERIN (NITROSTAT) 0.4 MG SL tablet Place 0.4 mg under the tongue every  5 (five) minutes as needed for chest pain.     [provider]  omega-3 acid ethyl esters (LOVAZA) 1 g capsule Take 1 capsule (1 g total) by mouth 2 (two) times daily. 03/05/16   Alison Murray, MD  omeprazole (PRILOSEC) 20 MG capsule Take 20 mg by mouth 2 (two) times daily before a meal.    [provider]  Propylene Glycol (SYSTANE BALANCE) 0.6 % SOLN Place 1 drop into both eyes See admin instructions. Place 1 drop into each eye four times a day and apply a warm compress for 10 minutes afterwards    [provider]  ranolazine (RANEXA) 500 MG 12 hr tablet Take 1 tablet (500 mg total) by mouth 2 (two) times daily. 05/15/19   Kroeger, Ovidio Kin., PA-C  Tiotropium Bromide-Olodaterol (STIOLTO RESPIMAT) 2.5-2.5 MCG/ACT AERS Inhale 2 puffs into the lungs daily.     [provider]    Allergies    Crestor [rosuvastatin calcium]  Review of Systems   Review of Systems  Constitutional: Positive for diaphoresis.  HENT: Negative for congestion.   Eyes: Negative for visual disturbance.  Respiratory: Positive for shortness of breath.   Cardiovascular: Positive for chest pain.  Gastrointestinal: Negative for abdominal pain, nausea and  vomiting.  Genitourinary: Negative for difficulty urinating.  Musculoskeletal: Negative for arthralgias.  Skin: Negative for rash.  Neurological: Negative for dizziness.  Psychiatric/Behavioral: Negative for agitation.  All other systems reviewed and are negative.   Physical Exam Updated Vital Signs BP 121/73 (BP Location: Right Arm)   Pulse 60   Temp 97.8 F (36.6 C) (Oral)   Resp 16   Ht  (1.803 m)   Wt 107 kg   SpO2 96%   BMI 32.92 kg/m   Physical Exam Vitals and nursing note reviewed.  Constitutional:      Appearance: Normal appearance. He is not diaphoretic.  HENT:     Head: Normocephalic and atraumatic.     Nose: Nose normal.  Eyes:     Conjunctiva/sclera: Conjunctivae normal.     Pupils: Pupils are equal, round, and reactive to light.  Cardiovascular:     Rate and Rhythm: Normal rate and regular rhythm.     Pulses: Normal pulses.     Heart sounds: Normal heart sounds.  Pulmonary:     Effort: Pulmonary effort is normal.     Breath sounds: Normal breath sounds.  Abdominal:     General: Abdomen is flat. Bowel sounds are normal.     Tenderness: There is no abdominal tenderness. There is no guarding or rebound.  Musculoskeletal:        General: Normal range of motion.     Cervical back: Normal range of motion and neck supple.  Skin:    General: Skin is warm and dry.     Capillary Refill: Capillary refill takes less than 2 seconds.  Neurological:     General: No focal deficit present.     Mental Status: He is alert and oriented to person, place, and time.  Psychiatric:        Mood and Affect: Mood normal.        Behavior: Behavior normal.     ED Results / Procedures / Treatments   Labs (all labs ordered are listed, but only abnormal results are displayed) Results for orders placed or performed during the hospital encounter of 05/21/19  Respiratory Panel by RT PCR (Flu A&B, Covid) - Nasopharyngeal Swab   Specimen: Nasopharyngeal Swab  Result Value  Ref Range   SARS Coronavirus 2 by RT PCR NEGATIVE NEGATIVE   Influenza A by PCR NEGATIVE NEGATIVE   Influenza B by PCR NEGATIVE NEGATIVE  CBC with Differential/Platelet  Result Value Ref Range   WBC 8.1 4.0 - 10.5 K/uL   RBC 5.32 4.22 - 5.81 MIL/uL   Hemoglobin 16.6 13.0 - 17.0 g/dL   HCT 50.7 39.0 - 52.0 %   MCV 95.3 80.0 - 100.0 fL   MCH 31.2 26.0 - 34.0 pg   MCHC 32.7 30.0 - 36.0 g/dL   RDW 14.7 11.5 - 15.5 %   Platelets 139 (L) 150 - 400 K/uL   nRBC 0.0 0.0 - 0.2 %   Neutrophils Relative % 55 %   Neutro Abs 4.6 1.7 - 7.7 K/uL   Lymphocytes Relative 33 %   Lymphs Abs 2.6 0.7 - 4.0 K/uL   Monocytes Relative 9 %   Monocytes Absolute 0.8 0.1 - 1.0 K/uL   Eosinophils Relative 1 %   Eosinophils Absolute 0.1 0.0 - 0.5 K/uL   Basophils Relative 1 %   Basophils Absolute 0.1 0.0 - 0.1 K/uL   Immature Granulocytes 1 %   Abs Immature Granulocytes 0.05 0.00 - 0.07 K/uL  Protime-INR  Result Value Ref Range   Prothrombin Time 13.0 11.4 - 15.2 seconds   INR 1.0 0.8 - 1.2  I-stat chem 8, ED (not at [facility] or [facility])  Result Value Ref Range   Sodium 140 135 - 145 mmol/L   Potassium 4.7 3.5 - 5.1 mmol/L   Chloride 108 98 - 111 mmol/L   BUN 12 8 - 23 mg/dL   Creatinine, Ser 0.80 0.61 - 1.24 mg/dL   Glucose, Bld 83 70 - 99 mg/dL   Calcium, Ion 0.89 (LL) 1.15 - 1.40 mmol/L   TCO2 24 22 - 32 mmol/L   Hemoglobin 13.3 13.0 - 17.0 g/dL   HCT 39.0 39.0 - 52.0 %  Troponin I (High Sensitivity)  Result Value Ref Range   Troponin I (High Sensitivity) 8 <18 ng/L   DG Chest 2 View  Result Date: 05/11/2019 CLINICAL DATA:  Chest pain since yesterday. EXAM: CHEST - 2 VIEW COMPARISON:  05/10/2018, 03/12/2019 and 06/21/2018 as well as chest CT 07/25/2018 FINDINGS: Left-sided pacemaker unchanged. Lungs are adequately inflated demonstrate chronic changes over the right mid to lower lung with pleural calcification present. No acute airspace process or effusion. Left lung is clear. Mild stable cardiomegaly.  Remainder of the exam is unchanged. IMPRESSION: 1.  No acute findings. 2. Chronic stable findings over the right mid to lower lung including significant pleural calcification. Electronically Signed   By: Marin Olp M.D.   On: 05/11/2019 11:25   DG Chest 2 View  Result Date: 05/10/2019 CLINICAL DATA:  Chest pain and shortness of breath EXAM: CHEST - 2 VIEW COMPARISON:  March 12, 2019 FINDINGS: There is underlying emphysematous change, better delineated on prior CT examination. There is right base pleural thickening. Calcification in this area is noted but better seen on CT, indicative of previous asbestos exposure. There is also calcification along the anterior pleura, similar to prior CT. There is scarring in the right base region. There is slight bibasilar atelectasis as well. No airspace consolidation. Heart size is normal. Pulmonary vascularity reflects underlying emphysematous change and is stable. Specifically, there is decreased vascularity to the upper lobes in areas of apparent bullous disease. Pacemaker leads are attached to the right atrium and right ventricle. No demonstrable adenopathy.  No bone lesions. IMPRESSION: Underlying emphysematous change. Areas of pleural thickening and scarring with calcification, indicative of prior asbestos exposure. Areas of scarring and atelectatic change. No edema or consolidation. Heart size within normal limits. Pacemaker leads attached to right atrium and right ventricle. No adenopathy appreciable. Appearance similar to prior studies. Electronically Signed   By: Bretta Bang III M.D.   On: 05/10/2019 13:55   CARDIAC CATHETERIZATION  Result Date: 05/12/2019  Recurrent, probably ischemically mediated ventricular tachycardia with multiple shocks.  High-grade native and diffuse in-stent restenosis ostial to mid circumflex 95 to 99%.  There is also a 90% stenosis beyond the stented segment.  Widely patent left main  Eccentric bulky and calcified 50 to  65% proximal to mid LAD beyond the origin of the large first diagonal.  Eccentric bulky and calcified ostial to proximal 70% first diagonal.  Both LAD and diagonal are mildly hemodynamically significant based upon RFR values 0.89 and 0.87, respectively.  RFR 0.89 or less is hemodynamically significant.  The right coronary particularly in the mid segment contains diffuse atherosclerosis but no focal high-grade obstruction.  Left ventriculography is not performed because of renal insufficiency.  LVEDP is normal measuring 10 mmHg. RECOMMENDATIONS:  We need to determine if the patient has any surgical options.  The likelihood of durable circumflex result is low due to the diffuse nature of disease and the need to place stents over a long segment, most of which would be stent sandwich.  The LAD and diagonal are mildly hemodynamically significant and could be grafted.  If PCI, will need orbital atherectomy over a long segment then reexpansion of the in-stent restenosis and if resistant, possibly shockwave PCI followed by extensive ostial to mid vessel restenting.  DG Chest Portable 1 View  Result Date: 05/21/2019 CLINICAL DATA:  Chest pain EXAM: PORTABLE CHEST 1 VIEW COMPARISON:  05/11/2018 FINDINGS: LEFT-sided pacemaker overlies stable enlarged cardiac silhouette. There is bibasilar atelectasis and basilar pleural thickening (with calcifications) similar to comparison exam. No overt pulmonary edema or infiltrate. No pneumothorax. IMPRESSION: 1. No clear acute cardiopulmonary findings. 2. Bibasilar atelectasis and pleural thickening. Electronically Signed   By: Genevive Bi M.D.   On: 05/21/2019 05:54   ECHOCARDIOGRAM COMPLETE  Result Date: 05/13/2019    ECHOCARDIOGRAM REPORT   Patient Name:   Max Rivas Date of Exam: 05/13/2019 Medical Rec #:  [id number]      Height:       71.0 in Accession #:    [id number]     Weight:       237.2 lb Date of Birth:  July 19, 1950      BSA:          2.267 m Patient Age:    69  years       BP:           100/63 mmHg Patient Gender: M              HR:           59 bpm. Exam Location:  Inpatient Procedure: 2D Echo Indications:    CAD (coronary artery disease)                 Pre-op evaluation  History:        Patient has prior history of Echocardiogram examinations, most                 recent 06/22/2018. CHF, ICD (implantable  cardioverter-defibrillator) in place, Pulmonary HTN,                 Arrythmias:VT (ventricular tachycardia) and Atrial Fibrillation,                 Signs/Symptoms:Chest Pain; Risk Factors:Tobacco abuse.  Sonographer:    Leeroy Bock Turrentine Referring Phys: [id number] HARRELL O LIGHTFOOT IMPRESSIONS  1. Akinesis of the inferior and inferolateral walls with overall mild to moderate LV dysfunction; grade 2 diastolic dysfunction; mild MR; mild LAE.  2. Left ventricular ejection fraction, by estimation, is 40 to 45%. The left ventricle has mildly decreased function. The left ventricle demonstrates regional wall motion abnormalities (see scoring diagram/findings for description). The left ventricular  internal cavity size was moderately dilated. Left ventricular diastolic parameters are consistent with Grade II diastolic dysfunction (pseudonormalization).  3. Right ventricular systolic function is normal. The right ventricular size is normal.  4. Left atrial size was mildly dilated.  5. The mitral valve is normal in structure. Mild mitral valve regurgitation. No evidence of mitral stenosis.  6. The aortic valve has an indeterminant number of cusps. Aortic valve regurgitation is not visualized. No aortic stenosis is present.  7. The inferior vena cava is normal in size with greater than 50% respiratory variability, suggesting right atrial pressure of 3 mmHg. FINDINGS  Left Ventricle: Left ventricular ejection fraction, by estimation, is 40 to 45%. The left ventricle has mildly decreased function. The left ventricle demonstrates regional wall motion abnormalities.  The left ventricular internal cavity size was moderately dilated. There is no left ventricular hypertrophy. Left ventricular diastolic parameters are consistent with Grade II diastolic dysfunction (pseudonormalization). Right Ventricle: The right ventricular size is normal. Right ventricular systolic function is normal. Left Atrium: Left atrial size was mildly dilated. Right Atrium: Right atrial size was normal in size. Pericardium: There is no evidence of pericardial effusion. Mitral Valve: The mitral valve is normal in structure. Normal mobility of the mitral valve leaflets. Mild mitral valve regurgitation. No evidence of mitral valve stenosis. Tricuspid Valve: The tricuspid valve is normal in structure. Tricuspid valve regurgitation is not demonstrated. No evidence of tricuspid stenosis. Aortic Valve: The aortic valve has an indeterminant number of cusps. Aortic valve regurgitation is not visualized. No aortic stenosis is present. Aortic valve mean gradient measures 5.0 mmHg. Aortic valve peak gradient measures 9.0 mmHg. Aortic valve area, by VTI measures 1.75 cm. Pulmonic Valve: The pulmonic valve was grossly normal. Pulmonic valve regurgitation is not visualized. No evidence of pulmonic stenosis. Aorta: The aortic root is normal in size and structure. Venous: The inferior vena cava is normal in size with greater than 50% respiratory variability, suggesting right atrial pressure of 3 mmHg.  Additional Comments: Akinesis of the inferior and inferolateral walls with overall mild to moderate LV dysfunction; grade 2 diastolic dysfunction; mild MR; mild LAE. A pacer wire is visualized.  LEFT VENTRICLE PLAX 2D LVIDd:         6.30 cm  Diastology LVIDs:         5.20 cm  LV e' lateral:   10.90 cm/s LV PW:         1.10 cm  LV E/e' lateral: 9.5 LV IVS:        1.10 cm  LV e' medial:    7.51 cm/s LVOT diam:     2.00 cm  LV E/e' medial:  13.8 LV SV:         59 LV SV Index:   26 LVOT Area:  3.14 cm  RIGHT VENTRICLE RV S  prime:     12.20 cm/s TAPSE (M-mode): 2.4 cm LEFT ATRIUM             Index       RIGHT ATRIUM           Index LA diam:        5.40 cm 2.38 cm/m  RA Area:     24.30 cm LA Vol (A2C):   97.7 ml 43.09 ml/m RA Volume:   79.10 ml  34.89 ml/m LA Vol (A4C):   79.5 ml 35.07 ml/m LA Biplane Vol: 88.6 ml 39.08 ml/m  AORTIC VALVE AV Area (Vmax):    1.68 cm AV Area (Vmean):   1.70 cm AV Area (VTI):     1.75 cm AV Vmax:           150.00 cm/s AV Vmean:          105.000 cm/s AV VTI:            0.336 m AV Peak Grad:      9.0 mmHg AV Mean Grad:      5.0 mmHg LVOT Vmax:         80.30 cm/s LVOT Vmean:        56.900 cm/s LVOT VTI:          0.187 m LVOT/AV VTI ratio: 0.56  AORTA Ao Root diam: 3.30 cm MITRAL VALVE MV Area (PHT): 3.48 cm     SHUNTS MV Decel Time: 218 msec     Systemic VTI:  0.19 m MR Peak grad: 100.0 mmHg    Systemic Diam: 2.00 cm MR Mean grad: 74.0 mmHg MR Vmax:      500.00 cm/s MR Vmean:     419.0 cm/s MV E velocity: 104.00 cm/s MV A velocity: 87.40 cm/s MV E/A ratio:  1.19 Olga Millers MD Electronically signed by Olga Millers MD Signature Date/Time: 05/13/2019/1:59:20 PM    Final     EKG  Date: 05/21/2019  Rate: 62  Rhythm: normal sinus rhythm  QRS Axis: normal  Intervals: normal  ST/T Wave abnormalities: normal  Conduction Disutrbances: non specific intraventricular conduction delay   Narrative Interpretation: PVC     Radiology DG Chest Portable 1 View  Result Date: 05/21/2019 CLINICAL DATA:  Chest pain EXAM: PORTABLE CHEST 1 VIEW COMPARISON:  05/11/2018 FINDINGS: LEFT-sided pacemaker overlies stable enlarged cardiac silhouette. There is bibasilar atelectasis and basilar pleural thickening (with calcifications) similar to comparison exam. No overt pulmonary edema or infiltrate. No pneumothorax. IMPRESSION: 1. No clear acute cardiopulmonary findings. 2. Bibasilar atelectasis and pleural thickening. Electronically Signed   By: Genevive Bi M.D.   On: 05/21/2019 05:54     Procedures Procedures (including critical care time)  Medications Ordered in ED Medications  aspirin chewable tablet 324 mg (0 mg Oral Hold 05/21/19 0554)  nitroGLYCERIN (NITROGLYN) 2 % ointment 0.5 inch (0.5 inches Topical Given 05/21/19 8341)    ED Course  I have reviewed the triage vital signs and the nursing notes.  Pertinent labs & imaging results that were available during my care of the patient were reviewed by me and considered in my medical decision making (see chart for details).      HEART score 6 will need inpatient rule out.  Patient is currently pain free but will need inpatient rule out  Final Clinical Impression(s) / ED Diagnoses    Laurena Valko, MD 05/21/19 9622

## 2019-05-21 NOTE — ED Notes (Signed)
Lunch Tray Ordered @ 1041. 

## 2019-05-21 NOTE — ED Provider Notes (Signed)
Blood pressure (!) 109/59, pulse (!) 55, temperature 97.8 F (36.6 C), temperature source Oral, resp. rate 15, height 5\' 11"  (1.803 m), weight 107 kg, SpO2 92 %.  Assuming care from Dr. .  In short, Max Rivas is a 69 y.o. male with a chief complaint of Chest Pain .  Refer to the original H&P for additional details.  The current plan of care is to f/u with Cardiology.   07:44 AM  Discussed patient's case with Cardiology to request admission. Patient and family (if present) updated with plan. Care transferred to Cardiology service.  I reviewed all nursing notes, vitals, pertinent old records, EKGs, labs, imaging (as available).     79, MD 05/21/19 [phone]

## 2019-05-22 ENCOUNTER — Encounter (HOSPITAL_COMMUNITY): Payer: Self-pay | Admitting: Internal Medicine

## 2019-05-22 ENCOUNTER — Inpatient Hospital Stay (HOSPITAL_COMMUNITY): Payer: No Typology Code available for payment source

## 2019-05-22 DIAGNOSIS — I200 Unstable angina: Secondary | ICD-10-CM

## 2019-05-22 DIAGNOSIS — I25110 Atherosclerotic heart disease of native coronary artery with unstable angina pectoris: Secondary | ICD-10-CM

## 2019-05-22 DIAGNOSIS — Z01810 Encounter for preprocedural cardiovascular examination: Secondary | ICD-10-CM

## 2019-05-22 LAB — HEPARIN LEVEL (UNFRACTIONATED): Heparin Unfractionated: 1.16 IU/mL — ABNORMAL HIGH (ref 0.30–0.70)

## 2019-05-22 LAB — BASIC METABOLIC PANEL
Anion gap: 9 (ref 5–15)
BUN: 13 mg/dL (ref 8–23)
CO2: 27 mmol/L (ref 22–32)
Calcium: 8.7 mg/dL — ABNORMAL LOW (ref 8.9–10.3)
Chloride: 103 mmol/L (ref 98–111)
Creatinine, Ser: 1.4 mg/dL — ABNORMAL HIGH (ref 0.61–1.24)
GFR calc Af Amer: 60 mL/min — ABNORMAL LOW (ref >60)
GFR calc non Af Amer: 52 mL/min — ABNORMAL LOW (ref >60)
Glucose, Bld: 96 mg/dL (ref 70–99)
Potassium: 4.1 mmol/L (ref 3.5–5.1)
Sodium: 139 mmol/L (ref 135–145)

## 2019-05-22 LAB — CBC
HCT: 46.7 % (ref 39.0–52.0)
Hemoglobin: 15.5 g/dL (ref 13.0–17.0)
MCH: 31.3 pg (ref 26.0–34.0)
MCHC: 33.2 g/dL (ref 30.0–36.0)
MCV: 94.3 fL (ref 80.0–100.0)
Platelets: 140 10*3/uL — ABNORMAL LOW (ref 150–400)
RBC: 4.95 MIL/uL (ref 4.22–5.81)
RDW: 14.8 % (ref 11.5–15.5)
WBC: 7.5 10*3/uL (ref 4.0–10.5)
nRBC: 0 % (ref 0.0–0.2)

## 2019-05-22 LAB — PREPARE RBC (CROSSMATCH)

## 2019-05-22 LAB — ABO/RH: ABO/RH(D): A POS

## 2019-05-22 LAB — APTT: aPTT: 98 seconds — ABNORMAL HIGH (ref 24–36)

## 2019-05-22 MED ORDER — BISACODYL 5 MG PO TBEC
5.0000 mg | DELAYED_RELEASE_TABLET | Freq: Once | ORAL | Status: AC
  Administered 2019-05-22: 5 mg via ORAL
  Filled 2019-05-22: qty 1

## 2019-05-22 MED ORDER — NITROGLYCERIN IN D5W 200-5 MCG/ML-% IV SOLN
2.0000 ug/min | INTRAVENOUS | Status: DC
  Filled 2019-05-22: qty 250

## 2019-05-22 MED ORDER — MILRINONE LACTATE IN DEXTROSE 20-5 MG/100ML-% IV SOLN
0.3000 ug/kg/min | INTRAVENOUS | Status: AC
  Administered 2019-05-23: .25 ug/kg/min via INTRAVENOUS
  Filled 2019-05-22: qty 100

## 2019-05-22 MED ORDER — EPINEPHRINE HCL 5 MG/250ML IV SOLN IN NS
0.0000 ug/min | INTRAVENOUS | Status: DC
  Filled 2019-05-22: qty 250

## 2019-05-22 MED ORDER — NOREPINEPHRINE 4 MG/250ML-% IV SOLN
0.0000 ug/min | INTRAVENOUS | Status: DC
  Filled 2019-05-22: qty 250

## 2019-05-22 MED ORDER — INSULIN REGULAR(HUMAN) IN NACL 100-0.9 UT/100ML-% IV SOLN
INTRAVENOUS | Status: AC
  Administered 2019-05-23: 1.1 [IU]/h via INTRAVENOUS
  Filled 2019-05-22: qty 100

## 2019-05-22 MED ORDER — PHENYLEPHRINE HCL-NACL 20-0.9 MG/250ML-% IV SOLN
30.0000 ug/min | INTRAVENOUS | Status: AC
  Administered 2019-05-23: 50 ug/min via INTRAVENOUS
  Filled 2019-05-22: qty 250

## 2019-05-22 MED ORDER — CHLORHEXIDINE GLUCONATE 0.12 % MT SOLN
15.0000 mL | Freq: Once | OROMUCOSAL | Status: AC
  Administered 2019-05-23: 15 mL via OROMUCOSAL
  Filled 2019-05-22: qty 15

## 2019-05-22 MED ORDER — SODIUM CHLORIDE 0.9 % IV SOLN
750.0000 mg | INTRAVENOUS | Status: AC
  Administered 2019-05-23: 750 mg via INTRAVENOUS
  Filled 2019-05-22: qty 750

## 2019-05-22 MED ORDER — CHLORHEXIDINE GLUCONATE CLOTH 2 % EX PADS
6.0000 | MEDICATED_PAD | Freq: Once | CUTANEOUS | Status: AC
  Administered 2019-05-22: 6 via TOPICAL

## 2019-05-22 MED ORDER — TRANEXAMIC ACID 1000 MG/10ML IV SOLN
1.5000 mg/kg/h | INTRAVENOUS | Status: AC
  Administered 2019-05-23: 1.5 mg/kg/h via INTRAVENOUS
  Filled 2019-05-22: qty 25

## 2019-05-22 MED ORDER — MANNITOL 20 % IV SOLN
Freq: Once | INTRAVENOUS | Status: DC
  Filled 2019-05-22: qty 13

## 2019-05-22 MED ORDER — METOPROLOL TARTRATE 12.5 MG HALF TABLET
12.5000 mg | ORAL_TABLET | Freq: Once | ORAL | Status: AC
  Administered 2019-05-23: 12.5 mg via ORAL
  Filled 2019-05-22: qty 1

## 2019-05-22 MED ORDER — PLASMA-LYTE 148 IV SOLN
INTRAVENOUS | Status: DC
  Filled 2019-05-22: qty 2.5

## 2019-05-22 MED ORDER — AMIODARONE HCL 200 MG PO TABS
200.0000 mg | ORAL_TABLET | Freq: Every day | ORAL | Status: DC
  Administered 2019-05-24: 200 mg via ORAL
  Filled 2019-05-22: qty 1

## 2019-05-22 MED ORDER — POTASSIUM CHLORIDE 2 MEQ/ML IV SOLN
80.0000 meq | INTRAVENOUS | Status: DC
  Filled 2019-05-22: qty 40

## 2019-05-22 MED ORDER — TRANEXAMIC ACID (OHS) PUMP PRIME SOLUTION
2.0000 mg/kg | INTRAVENOUS | Status: DC
  Filled 2019-05-22: qty 2.15

## 2019-05-22 MED ORDER — VANCOMYCIN HCL 1500 MG/300ML IV SOLN
1500.0000 mg | INTRAVENOUS | Status: AC
  Administered 2019-05-23: 1500 mg via INTRAVENOUS
  Filled 2019-05-22: qty 300

## 2019-05-22 MED ORDER — TRANEXAMIC ACID (OHS) BOLUS VIA INFUSION
15.0000 mg/kg | INTRAVENOUS | Status: AC
  Administered 2019-05-23: 1615.5 mg via INTRAVENOUS
  Filled 2019-05-22: qty 1616

## 2019-05-22 MED ORDER — DEXMEDETOMIDINE HCL IN NACL 400 MCG/100ML IV SOLN
0.1000 ug/kg/h | INTRAVENOUS | Status: AC
  Administered 2019-05-23: .5 ug/kg/h via INTRAVENOUS
  Filled 2019-05-22: qty 100

## 2019-05-22 MED ORDER — SODIUM CHLORIDE 0.9 % IV SOLN
1.5000 g | INTRAVENOUS | Status: AC
  Administered 2019-05-23: 1.5 g via INTRAVENOUS
  Filled 2019-05-22: qty 1.5

## 2019-05-22 MED ORDER — SODIUM CHLORIDE 0.9 % IV SOLN
INTRAVENOUS | Status: DC
  Filled 2019-05-22: qty 30

## 2019-05-22 MED ORDER — TEMAZEPAM 15 MG PO CAPS: 15.0000 mg | ORAL_CAPSULE | Freq: Once | ORAL | Status: DC | PRN

## 2019-05-22 NOTE — Progress Notes (Signed)
Progress Note  Patient Name: Max Rivas Date of Encounter: 05/22/2019  Primary Cardiologist: Dr Katrinka Blazing  Subjective   No CP or dyspnea  Inpatient Medications    Scheduled Meds: . amiodarone  400 mg Oral Daily  . aspirin  81 mg Oral Daily  . atorvastatin  80 mg Oral Daily  . budesonide  0.25 mg Inhalation BID  . levothyroxine  75 mcg Oral Q0600  . metoprolol succinate  50 mg Oral BID  . montelukast  10 mg Oral BID  . multivitamin with minerals  1 tablet Oral Daily  . nitroGLYCERIN  0.5 inch Topical Q6H  . omega-3 acid ethyl esters  1 g Oral BID  . pantoprazole  40 mg Oral BID AC  . polyvinyl alcohol  1 drop Both Eyes QHS  . ranolazine  500 mg Oral BID  . sodium chloride flush  3 mL Intravenous Q12H  . umeclidinium-vilanterol  1 puff Inhalation Daily   Continuous Infusions: . sodium chloride    . heparin 1,150 Units/hr (05/22/19 0627)   PRN Meds: sodium chloride, acetaminophen, ALPRAZolam, levalbuterol, nitroGLYCERIN, ondansetron (ZOFRAN) IV, sodium chloride flush, zolpidem   Vital Signs    Vitals:   05/22/19 0003 05/22/19 0509 05/22/19 0802 05/22/19 0803  BP: 136/76 112/63 113/64 113/64  Pulse: (!) 59 (!) 59 (!) 58 (!) 56  Resp:      Temp: (!) 97.5 F (36.4 C) (!) 97.5 F (36.4 C)  (!) 97.4 F (36.3 C)  TempSrc: Oral Oral  Oral  SpO2: 97% 95%  96%  Weight:  107.7 kg    Height:        Intake/Output Summary (Last 24 hours) at 05/22/2019 0851 Last data filed at 05/22/2019 6270 Gross per 24 hour  Intake 306.15 ml  Output 1800 ml  Net -1493.85 ml   Last 3 Weights 05/22/2019 05/21/2019 05/21/2019  Weight (lbs) 237 lb 7 oz 233 lb 8 oz 236 lb  Weight (kg) 107.7 kg 105.915 kg 107.049 kg      Telemetry    sinus - Personally Reviewed  Physical Exam   GEN: No acute distress.   Neck: No JVD Cardiac: RRR, no murmurs, rubs, or gallops.  Respiratory: Clear to auscultation bilaterally. GI: Soft, nontender, non-distended  MS: No edema Neuro:  Nonfocal  Psych:  Normal affect   Labs    High Sensitivity Troponin:   Recent Labs  Lab 05/10/19 1329 05/11/19 1104 05/11/19 1330 05/21/19 0526 05/21/19 0703  TROPONINIHS 18* 14 14 8 8       Chemistry Recent Labs  Lab 05/21/19 0550  NA 140  K 4.7  CL 108  GLUCOSE 83  BUN 12  CREATININE 0.80     Hematology Recent Labs  Lab 05/21/19 0526 05/21/19 0550 05/22/19 0636  WBC 8.1  --  7.5  RBC 5.32  --  4.95  HGB 16.6 13.3 15.5  HCT 50.7 39.0 46.7  MCV 95.3  --  94.3  MCH 31.2  --  31.3  MCHC 32.7  --  33.2  RDW 14.7  --  14.8  PLT 139*  --  140*    BNP Recent Labs  Lab 05/21/19 0526  BNP 122.3*     Radiology    DG Chest Portable 1 View  Result Date: 05/21/2019 CLINICAL DATA:  Chest pain EXAM: PORTABLE CHEST 1 VIEW COMPARISON:  05/11/2018 FINDINGS: LEFT-sided pacemaker overlies stable enlarged cardiac silhouette. There is bibasilar atelectasis and basilar pleural thickening (with calcifications) similar to comparison exam.  No overt pulmonary edema or infiltrate. No pneumothorax. IMPRESSION: 1. No clear acute cardiopulmonary findings. 2. Bibasilar atelectasis and pleural thickening. Electronically Signed   By: Suzy Bouchard M.D.   On: 05/21/2019 05:54    Patient Profile     69 y.o. male with past medical history of coronary artery disease, ischemic cardiomyopathy, chronic systolic congestive heart failure, prior ICD, atrial fibrillation, COPD, obstructive sleep apnea admitted with chest pain.  Patient recently discharged following admission for ventricular tachycardia.  Cardiac catheterization on April 30 showed 95 to 99% circumflex followed by a 90% lesion, 50 to 65% proximal to mid LAD, 70% first diagonal.  It was felt that the likelihood of durable circumflex result from PCI would be low in LAD/diagonal could be grafted.  Coronary artery bypass and graft was recommended but patient declined.  Echocardiogram showed ejection fraction 40 to 32%, grade 2 diastolic dysfunction, mild  mitral regurgitation and mild left atrial enlargement.  Assessment & Plan    1 unstable angina/coronary artery disease-patient is pain-free this morning.  Continue aspirin, heparin, nitroglycerin paste, beta-blocker and statin.  He is now agreeable to coronary artery bypass and graft.  We will ask cardiothoracic surgery to evaluate.  2 recent ventricular tachycardia-patient will continue on amiodarone but decrease to 200 mg daily.  ICD in place.  This was felt to be ischemia mediated.  3 paroxysmal atrial fibrillation-patient remains in sinus rhythm.  We will continue with amiodarone.  Eliquis is on hold due to potential for surgery.  Continue IV heparin.  Will need to resume Eliquis once all procedures complete.  4 history of COPD  For questions or updates, please contact [facility] Please consult [email or web address] for contact info under        Signed, Kirk Ruths, MD  05/22/2019, 8:51 AM

## 2019-05-22 NOTE — Progress Notes (Signed)
ANTICOAGULATION CONSULT NOTE  Pharmacy Consult for Heparin Indication: chest pain/ACS  Allergies  Allergen Reactions  . Crestor [Rosuvastatin Calcium] Other (See Comments)    Back spasms, but can tolerate it at a low dose now    Patient Measurements: Height: 5\' 11"  (180.3 cm) Weight: 107.7 kg (237 lb 7 oz) IBW/kg (Calculated) : 75.3 Heparin Dosing Weight: 98 kg  Vital Signs: Temp: 97.6 F (36.4 C) (05/10 1134) Temp Source: Oral (05/10 1134) BP: 118/77 (05/10 1134) Pulse Rate: 72 (05/10 1134)  Labs: Recent Labs    05/21/19 0526 05/21/19 0526 05/21/19 0550 05/21/19 0703 05/21/19 1624 05/22/19 0636  HGB 16.6   < > 13.3  --   --  15.5  HCT 50.7  --  39.0  --   --  46.7  PLT 139*  --   --   --   --  140*  APTT  --   --   --   --  82* 98*  LABPROT 13.0  --   --   --   --   --   INR 1.0  --   --   --   --   --   HEPARINUNFRC  --   --   --   --  1.74* 1.16*  CREATININE  --   --  0.80  --   --  1.40*  TROPONINIHS 8  --   --  8  --   --    < > = values in this interval not displayed.    Estimated Creatinine Clearance: 63.9 mL/min (A) (by C-G formula based on SCr of 1.4 mg/dL (H)).   Medical History: Past Medical History:  Diagnosis Date  . Arthritis    "elbows, knees" (02/24/2016)  . Bronchitis 2006  . CHF (congestive heart failure) (HCC)   . COPD (chronic obstructive pulmonary disease) (HCC)   . Coronary artery disease   . GERD (gastroesophageal reflux disease)   . High cholesterol   . Myocardial infarction [facility]) ~ 1995 X 2;1998; 2000; 2004  . On home oxygen therapy    "2L w/activity" (02/24/2016)  . Pacemaker   . Pulmonary embolism (HCC) 02/24/2016  . Sleep apnea    "have CPAP; can't tolerate it" (02/24/2016)  . Unstable angina (HCC) 05/21/2019    Medications:  Scheduled:  . [START ON 05/23/2019] amiodarone  200 mg Oral Daily  . aspirin  81 mg Oral Daily  . atorvastatin  80 mg Oral Daily  . bisacodyl  5 mg Oral Once  . budesonide  0.25 mg Inhalation BID   . [START ON 05/23/2019] chlorhexidine  15 mL Mouth/Throat Once  . Chlorhexidine Gluconate Cloth  6 each Topical Once   And  . Chlorhexidine Gluconate Cloth  6 each Topical Once  . [START ON 05/23/2019] epinephrine  0-10 mcg/min Intravenous To OR  . [START ON 05/23/2019] heparin-papaverine-plasmalyte irrigation   Irrigation To OR  . [START ON 05/23/2019] insulin   Intravenous To OR  . [START ON 05/23/2019] [place] Blood Cardioplegia vial (lidocaine/magnesium/mannitol 0.26g-4g-6.4g)   Intracoronary Once  . levothyroxine  75 mcg Oral Q0600  . metoprolol succinate  50 mg Oral BID  . [START ON 05/23/2019] metoprolol tartrate  12.5 mg Oral Once  . montelukast  10 mg Oral BID  . multivitamin with minerals  1 tablet Oral Daily  . nitroGLYCERIN  0.5 inch Topical Q6H  . omega-3 acid ethyl esters  1 g Oral BID  . pantoprazole  40 mg Oral BID  AC  . [START ON 05/23/2019] phenylephrine  30-200 mcg/min Intravenous To OR  . polyvinyl alcohol  1 drop Both Eyes QHS  . [START ON 05/23/2019] potassium chloride  80 mEq Other To OR  . ranolazine  500 mg Oral BID  . sodium chloride flush  3 mL Intravenous Q12H  . [START ON 05/23/2019] tranexamic acid  15 mg/kg Intravenous To OR  . [START ON 05/23/2019] tranexamic acid  2 mg/kg Intracatheter To OR  . umeclidinium-vilanterol  1 puff Inhalation Daily    Assessment: Patient is a 24 yom that presents to the ED with Chest pain. The patient was recently admitted for VT and was started on Amio and Apixaban. Pharmacy has been asked to dose heparin for ACS at this time.   Appt continues to be at goal (98s) with heparin level elevated due to recent apixaban use. CBC stable, no bleeding noted.   Goal of Therapy:  aPTT 66-102 seconds Monitor platelets by anticoagulation protocol: Yes   Plan:  - Continue Heparin drip @ 1150 units/hr - Will monitor heparin with aPTT until heparin level and aPTT correlate  - Monitor patient for s/s of bleeding and CBC while on heparin    Erin Hearing PharmD., BCPS Clinical Pharmacist 05/22/2019 2:50 PM

## 2019-05-22 NOTE — Progress Notes (Signed)
     [address]       [place] [address]             [phone]       69 yo male well known to our service.  He was admitted following a LHC which showed 2V CAD, and in-stent restenosis.  He was offered CABG, but refused.  At home, he had an episode of unstable angina, and called EMS.  Vitals:   05/22/19 0924 05/22/19 1134  BP:  118/77  Pulse:  72  Resp:    Temp:  97.6 F (36.4 C)  SpO2: 94% 94%   Alert NAD Sinus EWOB  69 yo male with 2V CAD, and instent restenosis, pAfib, ICD placement, and CHF.  OR tomorrow for CABG 3/atriclip placement.  Elsey Holts Keane Scrape

## 2019-05-22 NOTE — Anesthesia Preprocedure Evaluation (Addendum)
Anesthesia Evaluation  Patient identified by MRN, date of birth, ID band Patient awake    Reviewed: Allergy & Precautions, NPO status , Patient's Chart, lab work & pertinent test results, reviewed documented beta blocker date and time   Airway Mallampati: II  TM Distance: >3 FB Neck ROM: Full    Dental  (+) Dental Advisory Given, Poor Dentition, Chipped, Missing   Pulmonary sleep apnea , COPD, Current Smoker,    Pulmonary exam normal breath sounds clear to auscultation       Cardiovascular Pt. on home beta blockers + angina + CAD, + Past MI and +CHF  Normal cardiovascular exam+ pacemaker  Rhythm:Regular Rate:Normal  Echo 05/13/2019 1. Akinesis of the inferior and inferolateral walls with overall mild to moderate LV dysfunction; grade 2 diastolic dysfunction; mild MR; mild LAE.  2. Left ventricular ejection fraction, by estimation, is 40 to 45%. The left ventricle has mildly decreased function. The left ventricle demonstrates regional wall motion abnormalities (see scoring diagram/findings for description). The left ventricular internal cavity size was moderately dilated. Left ventricular diastolic parameters are consistent with Grade II diastolic dysfunction (pseudonormalization).  3. Right ventricular systolic function is normal. The right ventricular size is normal.  4. Left atrial size was mildly dilated.  5. The mitral valve is normal in structure. Mild mitral valve  regurgitation. No evidence of mitral stenosis.  6. The aortic valve has an indeterminant number of cusps. Aortic valve regurgitation is not visualized. No aortic stenosis is present.  7. The inferior vena cava is normal in size with greater than 50% respiratory variability, suggesting right atrial pressure of 3 mmHg.   Neuro/Psych negative neurological ROS  negative psych ROS   GI/Hepatic Neg liver ROS, GERD  ,  Endo/Other  Hypothyroidism   Renal/GU Renal  disease     Musculoskeletal  (+) Arthritis ,   Abdominal (+) + obese,   Peds  Hematology negative hematology ROS (+)   Anesthesia Other Findings   Reproductive/Obstetrics negative OB ROS                            Anesthesia Physical Anesthesia Plan  ASA: IV  Anesthesia Plan: General   Post-op Pain Management:    Induction: Intravenous  PONV Risk Score and Plan: 2 and Midazolam and Treatment may vary due to age or medical condition  Airway Management Planned: Oral ETT  Additional Equipment: Arterial line, CVP, PA Cath, TEE and Ultrasound Guidance Line Placement  Intra-op Plan: Utilization Of Total Body Hypothermia per surgeon request  Post-operative Plan: Post-operative intubation/ventilation  Informed Consent: I have reviewed the patients History and Physical, chart, labs and discussed the procedure including the risks, benefits and alternatives for the proposed anesthesia with the patient or authorized representative who has indicated his/her understanding and acceptance.     Dental advisory given  Plan Discussed with: CRNA  Anesthesia Plan Comments:         Anesthesia Quick Evaluation

## 2019-05-22 NOTE — Progress Notes (Signed)
Pre-CABG testing has been completed. Preliminary results can be found in CV Proc through chart review.   05/22/19 3:34 PM Olen Cordial RVT

## 2019-05-22 NOTE — Progress Notes (Signed)
[id number] Gave pt OHS booklet, staying in the tube handout, and IS. Pt could reach 600 ml correctly on IS. Discussed sternal precautions and staying in the tube. Discussed importance of mobility and IS after surgery. Pt stated he has roommate who can stay with him after discharge. Wrote down how to view pre op video. Pt thankful for information. Will continue to follow.  Luetta Nutting RN BSN 05/22/2019 11:26 AM

## 2019-05-23 ENCOUNTER — Inpatient Hospital Stay (HOSPITAL_COMMUNITY): Payer: No Typology Code available for payment source | Admitting: Certified Registered"

## 2019-05-23 ENCOUNTER — Inpatient Hospital Stay (HOSPITAL_COMMUNITY): Payer: No Typology Code available for payment source | Admitting: Certified Registered Nurse Anesthetist

## 2019-05-23 ENCOUNTER — Inpatient Hospital Stay (HOSPITAL_COMMUNITY): Payer: No Typology Code available for payment source

## 2019-05-23 ENCOUNTER — Encounter (HOSPITAL_COMMUNITY)
Admission: EM | Disposition: A | Discharge status: Home Health | Payer: Self-pay | Source: Home / Self Care | Attending: Surgery

## 2019-05-23 ENCOUNTER — Encounter (HOSPITAL_COMMUNITY): Payer: Self-pay | Admitting: Internal Medicine

## 2019-05-23 HISTORY — PX: CORONARY ARTERY BYPASS GRAFT: SHX141

## 2019-05-23 HISTORY — PX: CLIPPING OF ATRIAL APPENDAGE: SHX5773

## 2019-05-23 HISTORY — PX: TEE WITHOUT CARDIOVERSION: SHX5443

## 2019-05-23 LAB — CBC
HCT: 45.4 % (ref 39.0–52.0)
HCT: 42.6 % (ref 39.0–52.0)
HCT: 48 % (ref 39.0–52.0)
Hemoglobin: 15.1 g/dL (ref 13.0–17.0)
Hemoglobin: 14.2 g/dL (ref 13.0–17.0)
Hemoglobin: 15.9 g/dL (ref 13.0–17.0)
MCH: 31.5 pg (ref 26.0–34.0)
MCH: 31.8 pg (ref 26.0–34.0)
MCH: 31.4 pg (ref 26.0–34.0)
MCHC: 33.3 g/dL (ref 30.0–36.0)
MCHC: 33.3 g/dL (ref 30.0–36.0)
MCHC: 33.1 g/dL (ref 30.0–36.0)
MCV: 94.8 fL (ref 80.0–100.0)
MCV: 95.3 fL (ref 80.0–100.0)
MCV: 94.9 fL (ref 80.0–100.0)
Platelets: 131 10*3/uL — ABNORMAL LOW (ref 150–400)
Platelets: 126 10*3/uL — ABNORMAL LOW (ref 150–400)
Platelets: 152 10*3/uL (ref 150–400)
RBC: 4.79 MIL/uL (ref 4.22–5.81)
RBC: 4.47 MIL/uL (ref 4.22–5.81)
RBC: 5.06 MIL/uL (ref 4.22–5.81)
RDW: 14.7 % (ref 11.5–15.5)
RDW: 14.7 % (ref 11.5–15.5)
RDW: 14.6 % (ref 11.5–15.5)
WBC: 7.2 10*3/uL (ref 4.0–10.5)
WBC: 16.5 10*3/uL — ABNORMAL HIGH (ref 4.0–10.5)
WBC: 19.5 10*3/uL — ABNORMAL HIGH (ref 4.0–10.5)
nRBC: 0 % (ref 0.0–0.2)
nRBC: 0 % (ref 0.0–0.2)
nRBC: 0 % (ref 0.0–0.2)

## 2019-05-23 LAB — SURGICAL PCR SCREEN
MRSA, PCR: NEGATIVE
Staphylococcus aureus: NEGATIVE

## 2019-05-23 LAB — POCT I-STAT 7, (LYTES, BLD GAS, ICA,H+H)
Acid-Base Excess: 2 mmol/L (ref 0.0–2.0)
Acid-Base Excess: 2 mmol/L (ref 0.0–2.0)
Acid-Base Excess: 3 mmol/L — ABNORMAL HIGH (ref 0.0–2.0)
Acid-Base Excess: 0 mmol/L (ref 0.0–2.0)
Acid-Base Excess: 0 mmol/L (ref 0.0–2.0)
Acid-Base Excess: 2 mmol/L (ref 0.0–2.0)
Acid-base deficit: 3 mmol/L — ABNORMAL HIGH (ref 0.0–2.0)
Bicarbonate: 29.3 mmol/L — ABNORMAL HIGH (ref 20.0–28.0)
Bicarbonate: 28.1 mmol/L — ABNORMAL HIGH (ref 20.0–28.0)
Bicarbonate: 26.5 mmol/L (ref 20.0–28.0)
Bicarbonate: 25.4 mmol/L (ref 20.0–28.0)
Bicarbonate: 24.5 mmol/L (ref 20.0–28.0)
Bicarbonate: 27.9 mmol/L (ref 20.0–28.0)
Bicarbonate: 31.4 mmol/L — ABNORMAL HIGH (ref 20.0–28.0)
Calcium, Ion: 1.27 mmol/L (ref 1.15–1.40)
Calcium, Ion: 1.05 mmol/L — ABNORMAL LOW (ref 1.15–1.40)
Calcium, Ion: 1.04 mmol/L — ABNORMAL LOW (ref 1.15–1.40)
Calcium, Ion: 1.31 mmol/L (ref 1.15–1.40)
Calcium, Ion: 1.19 mmol/L (ref 1.15–1.40)
Calcium, Ion: 1.17 mmol/L (ref 1.15–1.40)
Calcium, Ion: 1.19 mmol/L (ref 1.15–1.40)
HCT: 45 % (ref 39.0–52.0)
HCT: 36 % — ABNORMAL LOW (ref 39.0–52.0)
HCT: 33 % — ABNORMAL LOW (ref 39.0–52.0)
HCT: 35 % — ABNORMAL LOW (ref 39.0–52.0)
HCT: 41 % (ref 39.0–52.0)
HCT: 45 % (ref 39.0–52.0)
HCT: 47 % (ref 39.0–52.0)
Hemoglobin: 15.3 g/dL (ref 13.0–17.0)
Hemoglobin: 12.2 g/dL — ABNORMAL LOW (ref 13.0–17.0)
Hemoglobin: 11.2 g/dL — ABNORMAL LOW (ref 13.0–17.0)
Hemoglobin: 11.9 g/dL — ABNORMAL LOW (ref 13.0–17.0)
Hemoglobin: 13.9 g/dL (ref 13.0–17.0)
Hemoglobin: 15.3 g/dL (ref 13.0–17.0)
Hemoglobin: 16 g/dL (ref 13.0–17.0)
O2 Saturation: 100 %
O2 Saturation: 100 %
O2 Saturation: 100 %
O2 Saturation: 97 %
O2 Saturation: 93 %
O2 Saturation: 92 %
O2 Saturation: 100 %
Patient temperature: 36
Patient temperature: 36.4
Patient temperature: 36.4
Potassium: 4.1 mmol/L (ref 3.5–5.1)
Potassium: 4.7 mmol/L (ref 3.5–5.1)
Potassium: 5.1 mmol/L (ref 3.5–5.1)
Potassium: 4.3 mmol/L (ref 3.5–5.1)
Potassium: 4 mmol/L (ref 3.5–5.1)
Potassium: 4.4 mmol/L (ref 3.5–5.1)
Potassium: 4.7 mmol/L (ref 3.5–5.1)
Sodium: 139 mmol/L (ref 135–145)
Sodium: 139 mmol/L (ref 135–145)
Sodium: 138 mmol/L (ref 135–145)
Sodium: 139 mmol/L (ref 135–145)
Sodium: 140 mmol/L (ref 135–145)
Sodium: 140 mmol/L (ref 135–145)
Sodium: 139 mmol/L (ref 135–145)
TCO2: 31 mmol/L (ref 22–32)
TCO2: 30 mmol/L (ref 22–32)
TCO2: 28 mmol/L (ref 22–32)
TCO2: 27 mmol/L (ref 22–32)
TCO2: 26 mmol/L (ref 22–32)
TCO2: 30 mmol/L (ref 22–32)
TCO2: 34 mmol/L — ABNORMAL HIGH (ref 22–32)
pCO2 arterial: 55.5 mmHg — ABNORMAL HIGH (ref 32.0–48.0)
pCO2 arterial: 52.1 mmHg — ABNORMAL HIGH (ref 32.0–48.0)
pCO2 arterial: 35.9 mmHg (ref 32.0–48.0)
pCO2 arterial: 42.8 mmHg (ref 32.0–48.0)
pCO2 arterial: 48.9 mmHg — ABNORMAL HIGH (ref 32.0–48.0)
pCO2 arterial: 54.9 mmHg — ABNORMAL HIGH (ref 32.0–48.0)
pCO2 arterial: 67.6 mmHg (ref 32.0–48.0)
pH, Arterial: 7.331 — ABNORMAL LOW (ref 7.350–7.450)
pH, Arterial: 7.34 — ABNORMAL LOW (ref 7.350–7.450)
pH, Arterial: 7.476 — ABNORMAL HIGH (ref 7.350–7.450)
pH, Arterial: 7.382 (ref 7.350–7.450)
pH, Arterial: 7.303 — ABNORMAL LOW (ref 7.350–7.450)
pH, Arterial: 7.311 — ABNORMAL LOW (ref 7.350–7.450)
pH, Arterial: 7.272 — ABNORMAL LOW (ref 7.350–7.450)
pO2, Arterial: 430 mmHg — ABNORMAL HIGH (ref 83.0–108.0)
pO2, Arterial: 391 mmHg — ABNORMAL HIGH (ref 83.0–108.0)
pO2, Arterial: 335 mmHg — ABNORMAL HIGH (ref 83.0–108.0)
pO2, Arterial: 97 mmHg (ref 83.0–108.0)
pO2, Arterial: 70 mmHg — ABNORMAL LOW (ref 83.0–108.0)
pO2, Arterial: 69 mmHg — ABNORMAL LOW (ref 83.0–108.0)
pO2, Arterial: 399 mmHg — ABNORMAL HIGH (ref 83.0–108.0)

## 2019-05-23 LAB — POCT I-STAT, CHEM 8
BUN: 13 mg/dL (ref 8–23)
BUN: 12 mg/dL (ref 8–23)
BUN: 11 mg/dL (ref 8–23)
BUN: 12 mg/dL (ref 8–23)
BUN: 11 mg/dL (ref 8–23)
Calcium, Ion: 1.29 mmol/L (ref 1.15–1.40)
Calcium, Ion: 1.25 mmol/L (ref 1.15–1.40)
Calcium, Ion: 0.94 mmol/L — ABNORMAL LOW (ref 1.15–1.40)
Calcium, Ion: 1.06 mmol/L — ABNORMAL LOW (ref 1.15–1.40)
Calcium, Ion: 1.32 mmol/L (ref 1.15–1.40)
Chloride: 101 mmol/L (ref 98–111)
Chloride: 102 mmol/L (ref 98–111)
Chloride: 100 mmol/L (ref 98–111)
Chloride: 101 mmol/L (ref 98–111)
Chloride: 102 mmol/L (ref 98–111)
Creatinine, Ser: 1.1 mg/dL (ref 0.61–1.24)
Creatinine, Ser: 1.1 mg/dL (ref 0.61–1.24)
Creatinine, Ser: 0.9 mg/dL (ref 0.61–1.24)
Creatinine, Ser: 1 mg/dL (ref 0.61–1.24)
Creatinine, Ser: 0.9 mg/dL (ref 0.61–1.24)
Glucose, Bld: 103 mg/dL — ABNORMAL HIGH (ref 70–99)
Glucose, Bld: 95 mg/dL (ref 70–99)
Glucose, Bld: 108 mg/dL — ABNORMAL HIGH (ref 70–99)
Glucose, Bld: 97 mg/dL (ref 70–99)
Glucose, Bld: 107 mg/dL — ABNORMAL HIGH (ref 70–99)
HCT: 47 % (ref 39.0–52.0)
HCT: 41 % (ref 39.0–52.0)
HCT: 33 % — ABNORMAL LOW (ref 39.0–52.0)
HCT: 34 % — ABNORMAL LOW (ref 39.0–52.0)
HCT: 37 % — ABNORMAL LOW (ref 39.0–52.0)
Hemoglobin: 16 g/dL (ref 13.0–17.0)
Hemoglobin: 13.9 g/dL (ref 13.0–17.0)
Hemoglobin: 11.2 g/dL — ABNORMAL LOW (ref 13.0–17.0)
Hemoglobin: 11.6 g/dL — ABNORMAL LOW (ref 13.0–17.0)
Hemoglobin: 12.6 g/dL — ABNORMAL LOW (ref 13.0–17.0)
Potassium: 4.2 mmol/L (ref 3.5–5.1)
Potassium: 4.2 mmol/L (ref 3.5–5.1)
Potassium: 5 mmol/L (ref 3.5–5.1)
Potassium: 5.2 mmol/L — ABNORMAL HIGH (ref 3.5–5.1)
Potassium: 4.3 mmol/L (ref 3.5–5.1)
Sodium: 140 mmol/L (ref 135–145)
Sodium: 138 mmol/L (ref 135–145)
Sodium: 137 mmol/L (ref 135–145)
Sodium: 137 mmol/L (ref 135–145)
Sodium: 139 mmol/L (ref 135–145)
TCO2: 30 mmol/L (ref 22–32)
TCO2: 29 mmol/L (ref 22–32)
TCO2: 29 mmol/L (ref 22–32)
TCO2: 31 mmol/L (ref 22–32)
TCO2: 25 mmol/L (ref 22–32)

## 2019-05-23 LAB — GLUCOSE, CAPILLARY
Glucose-Capillary: 124 mg/dL — ABNORMAL HIGH (ref 70–99)
Glucose-Capillary: 124 mg/dL — ABNORMAL HIGH (ref 70–99)
Glucose-Capillary: 134 mg/dL — ABNORMAL HIGH (ref 70–99)
Glucose-Capillary: 121 mg/dL — ABNORMAL HIGH (ref 70–99)
Glucose-Capillary: 147 mg/dL — ABNORMAL HIGH (ref 70–99)
Glucose-Capillary: 128 mg/dL — ABNORMAL HIGH (ref 70–99)
Glucose-Capillary: 116 mg/dL — ABNORMAL HIGH (ref 70–99)
Glucose-Capillary: 157 mg/dL — ABNORMAL HIGH (ref 70–99)
Glucose-Capillary: 141 mg/dL — ABNORMAL HIGH (ref 70–99)

## 2019-05-23 LAB — BASIC METABOLIC PANEL
Anion gap: 9 (ref 5–15)
Anion gap: 5 (ref 5–15)
BUN: 14 mg/dL (ref 8–23)
BUN: 10 mg/dL (ref 8–23)
CO2: 26 mmol/L (ref 22–32)
CO2: 26 mmol/L (ref 22–32)
Calcium: 9 mg/dL (ref 8.9–10.3)
Calcium: 7.8 mg/dL — ABNORMAL LOW (ref 8.9–10.3)
Chloride: 105 mmol/L (ref 98–111)
Chloride: 105 mmol/L (ref 98–111)
Creatinine, Ser: 1.32 mg/dL — ABNORMAL HIGH (ref 0.61–1.24)
Creatinine, Ser: 1.2 mg/dL (ref 0.61–1.24)
GFR calc Af Amer: >60 mL/min (ref >60)
GFR calc Af Amer: >60 mL/min (ref >60)
GFR calc non Af Amer: 55 mL/min — ABNORMAL LOW (ref >60)
GFR calc non Af Amer: >60 mL/min (ref >60)
Glucose, Bld: 91 mg/dL (ref 70–99)
Glucose, Bld: 140 mg/dL — ABNORMAL HIGH (ref 70–99)
Potassium: 4 mmol/L (ref 3.5–5.1)
Potassium: 4.6 mmol/L (ref 3.5–5.1)
Sodium: 140 mmol/L (ref 135–145)
Sodium: 136 mmol/L (ref 135–145)

## 2019-05-23 LAB — ECHO INTRAOPERATIVE TEE
Height: 71 in
Weight: 3716.8 oz

## 2019-05-23 LAB — URINALYSIS, ROUTINE W REFLEX MICROSCOPIC
Bilirubin Urine: NEGATIVE
Glucose, UA: NEGATIVE mg/dL
Hgb urine dipstick: NEGATIVE
Ketones, ur: NEGATIVE mg/dL
Leukocytes,Ua: NEGATIVE
Nitrite: NEGATIVE
Protein, ur: NEGATIVE mg/dL
Specific Gravity, Urine: 1.011 (ref 1.005–1.030)
pH: 5 (ref 5.0–8.0)

## 2019-05-23 LAB — BLOOD GAS, ARTERIAL
Acid-Base Excess: 2.2 mmol/L — ABNORMAL HIGH (ref 0.0–2.0)
Bicarbonate: 26.8 mmol/L (ref 20.0–28.0)
FIO2: 21
O2 Saturation: 93.1 %
Patient temperature: 36.6
pCO2 arterial: 45 mmHg (ref 32.0–48.0)
pH, Arterial: 7.39 (ref 7.350–7.450)
pO2, Arterial: 67.5 mmHg — ABNORMAL LOW (ref 83.0–108.0)

## 2019-05-23 LAB — APTT
aPTT: 118 seconds — ABNORMAL HIGH (ref 24–36)
aPTT: 34 seconds (ref 24–36)

## 2019-05-23 LAB — HEMOGLOBIN AND HEMATOCRIT, BLOOD
HCT: 35.2 % — ABNORMAL LOW (ref 39.0–52.0)
Hemoglobin: 11.6 g/dL — ABNORMAL LOW (ref 13.0–17.0)

## 2019-05-23 LAB — COOXEMETRY PANEL
Carboxyhemoglobin: 1.5 % (ref 0.5–1.5)
Methemoglobin: 1.4 % (ref 0.0–1.5)
O2 Saturation: 71.4 %
Total hemoglobin: 15.9 g/dL (ref 12.0–16.0)

## 2019-05-23 LAB — PROTIME-INR
INR: 1.3 — ABNORMAL HIGH (ref 0.8–1.2)
Prothrombin Time: 16.1 seconds — ABNORMAL HIGH (ref 11.4–15.2)

## 2019-05-23 LAB — HEPARIN LEVEL (UNFRACTIONATED): Heparin Unfractionated: 0.91 IU/mL — ABNORMAL HIGH (ref 0.30–0.70)

## 2019-05-23 LAB — PLATELET COUNT: Platelets: 93 10*3/uL — ABNORMAL LOW (ref 150–400)

## 2019-05-23 LAB — HEMOGLOBIN A1C
Hgb A1c MFr Bld: 6.3 % — ABNORMAL HIGH (ref 4.8–5.6)
Mean Plasma Glucose: 134.11 mg/dL

## 2019-05-23 LAB — MAGNESIUM: Magnesium: 3.1 mg/dL — ABNORMAL HIGH (ref 1.7–2.4)

## 2019-05-23 SURGERY — CORONARY ARTERY BYPASS GRAFTING (CABG)
Anesthesia: General | Site: Chest

## 2019-05-23 MED ORDER — MIDAZOLAM HCL 5 MG/5ML IJ SOLN
INTRAMUSCULAR | Status: DC | PRN
  Administered 2019-05-23: 5 mg via INTRAVENOUS
  Administered 2019-05-23: 3 mg via INTRAVENOUS
  Administered 2019-05-23 (×2): 2 mg via INTRAVENOUS

## 2019-05-23 MED ORDER — THROMBIN 20000 UNITS EX SOLR
CUTANEOUS | Status: DC | PRN
  Administered 2019-05-23: 20000 [IU] via TOPICAL

## 2019-05-23 MED ORDER — MIDAZOLAM HCL 2 MG/2ML IJ SOLN: 2.0000 mg | INTRAMUSCULAR | Status: DC | PRN

## 2019-05-23 MED ORDER — ROCURONIUM BROMIDE 10 MG/ML (PF) SYRINGE
PREFILLED_SYRINGE | INTRAVENOUS | Status: DC | PRN
  Administered 2019-05-23: 100 mg via INTRAVENOUS
  Administered 2019-05-23 (×4): 50 mg via INTRAVENOUS

## 2019-05-23 MED ORDER — ALBUMIN HUMAN 5 % IV SOLN: INTRAVENOUS | Status: DC | PRN

## 2019-05-23 MED ORDER — THROMBIN (RECOMBINANT) 20000 UNITS EX SOLR
CUTANEOUS | Status: AC
  Filled 2019-05-23: qty 20000

## 2019-05-23 MED ORDER — SODIUM CHLORIDE 0.9% FLUSH: 10.0000 mL | INTRAVENOUS | Status: DC | PRN

## 2019-05-23 MED ORDER — LACTATED RINGERS IV SOLN: 500.0000 mL | Freq: Once | INTRAVENOUS | Status: DC | PRN

## 2019-05-23 MED ORDER — SODIUM CHLORIDE 0.9% FLUSH: 3.0000 mL | INTRAVENOUS | Status: DC | PRN

## 2019-05-23 MED ORDER — ARTIFICIAL TEARS OPHTHALMIC OINT
TOPICAL_OINTMENT | OPHTHALMIC | Status: AC
  Filled 2019-05-23: qty 3.5

## 2019-05-23 MED ORDER — METOPROLOL TARTRATE 5 MG/5ML IV SOLN: 2.5000 mg | INTRAVENOUS | Status: DC | PRN

## 2019-05-23 MED ORDER — CHLORHEXIDINE GLUCONATE CLOTH 2 % EX PADS
6.0000 | MEDICATED_PAD | Freq: Every day | CUTANEOUS | Status: DC
  Administered 2019-05-23 – 2019-05-28 (×6): 6 via TOPICAL

## 2019-05-23 MED ORDER — MIDAZOLAM HCL 2 MG/2ML IJ SOLN
INTRAMUSCULAR | Status: AC
  Filled 2019-05-23: qty 2

## 2019-05-23 MED ORDER — INSULIN REGULAR(HUMAN) IN NACL 100-0.9 UT/100ML-% IV SOLN: INTRAVENOUS | Status: DC

## 2019-05-23 MED ORDER — LACTATED RINGERS IV SOLN: INTRAVENOUS | Status: DC

## 2019-05-23 MED ORDER — BISACODYL 10 MG RE SUPP
10.0000 mg | Freq: Every day | RECTAL | Status: DC
  Filled 2019-05-23: qty 1

## 2019-05-23 MED ORDER — ONDANSETRON HCL 4 MG/2ML IJ SOLN: 4.0000 mg | Freq: Four times a day (QID) | INTRAMUSCULAR | Status: DC | PRN

## 2019-05-23 MED ORDER — ARTIFICIAL TEARS OPHTHALMIC OINT
TOPICAL_OINTMENT | OPHTHALMIC | Status: DC | PRN
  Administered 2019-05-23: 1 via OPHTHALMIC

## 2019-05-23 MED ORDER — SODIUM CHLORIDE 0.9% FLUSH
3.0000 mL | Freq: Two times a day (BID) | INTRAVENOUS | Status: DC
  Administered 2019-05-24 – 2019-06-01 (×13): 3 mL via INTRAVENOUS

## 2019-05-23 MED ORDER — LACTATED RINGERS IV SOLN: INTRAVENOUS | Status: DC | PRN

## 2019-05-23 MED ORDER — SUCCINYLCHOLINE CHLORIDE 20 MG/ML IJ SOLN
INTRAMUSCULAR | Status: DC | PRN
  Administered 2019-05-23: 100 mg via INTRAVENOUS

## 2019-05-23 MED ORDER — PANTOPRAZOLE SODIUM 40 MG PO TBEC
40.0000 mg | DELAYED_RELEASE_TABLET | Freq: Every day | ORAL | Status: DC
  Administered 2019-05-25 – 2019-06-01 (×8): 40 mg via ORAL
  Filled 2019-05-23 (×8): qty 1

## 2019-05-23 MED ORDER — CHLORHEXIDINE GLUCONATE 0.12 % MT SOLN
15.0000 mL | OROMUCOSAL | Status: AC
  Administered 2019-05-23: 15 mL via OROMUCOSAL

## 2019-05-23 MED ORDER — PHENYLEPHRINE 40 MCG/ML (10ML) SYRINGE FOR IV PUSH (FOR BLOOD PRESSURE SUPPORT)
PREFILLED_SYRINGE | INTRAVENOUS | Status: AC
  Filled 2019-05-23: qty 10

## 2019-05-23 MED ORDER — POTASSIUM CHLORIDE 10 MEQ/50ML IV SOLN: 10.0000 meq | INTRAVENOUS | Status: AC

## 2019-05-23 MED ORDER — SODIUM CHLORIDE 0.9 % IV SOLN
250.0000 mL | INTRAVENOUS | Status: DC
  Administered 2019-05-28: 250 mL via INTRAVENOUS

## 2019-05-23 MED ORDER — EPHEDRINE SULFATE-NACL 50-0.9 MG/10ML-% IV SOSY
PREFILLED_SYRINGE | INTRAVENOUS | Status: DC | PRN
  Administered 2019-05-23 (×2): 5 mg via INTRAVENOUS

## 2019-05-23 MED ORDER — MILRINONE LACTATE IN DEXTROSE 20-5 MG/100ML-% IV SOLN
0.2500 ug/kg/min | INTRAVENOUS | Status: DC
  Administered 2019-05-23: 0.25 ug/kg/min via INTRAVENOUS
  Filled 2019-05-23: qty 100

## 2019-05-23 MED ORDER — VANCOMYCIN HCL IN DEXTROSE 1-5 GM/200ML-% IV SOLN
1000.0000 mg | Freq: Once | INTRAVENOUS | Status: AC
  Administered 2019-05-23: 1000 mg via INTRAVENOUS
  Filled 2019-05-23: qty 200

## 2019-05-23 MED ORDER — LEVALBUTEROL HCL 1.25 MG/0.5ML IN NEBU
1.2500 mg | INHALATION_SOLUTION | Freq: Four times a day (QID) | RESPIRATORY_TRACT | Status: DC
  Administered 2019-05-23 – 2019-05-26 (×12): 1.25 mg via RESPIRATORY_TRACT
  Filled 2019-05-23 (×12): qty 0.5

## 2019-05-23 MED ORDER — CHLORHEXIDINE GLUCONATE 0.12% ORAL RINSE (MEDLINE KIT)
15.0000 mL | Freq: Two times a day (BID) | OROMUCOSAL | Status: DC
  Administered 2019-05-23 – 2019-05-24 (×3): 15 mL via OROMUCOSAL

## 2019-05-23 MED ORDER — PROPOFOL 10 MG/ML IV BOLUS
INTRAVENOUS | Status: AC
  Filled 2019-05-23: qty 20

## 2019-05-23 MED ORDER — ROCURONIUM BROMIDE 10 MG/ML (PF) SYRINGE
PREFILLED_SYRINGE | INTRAVENOUS | Status: AC
  Filled 2019-05-23: qty 10

## 2019-05-23 MED ORDER — PROPOFOL 10 MG/ML IV BOLUS
INTRAVENOUS | Status: DC | PRN
  Administered 2019-05-23: 50 mg via INTRAVENOUS

## 2019-05-23 MED ORDER — FAMOTIDINE IN NACL 20-0.9 MG/50ML-% IV SOLN
20.0000 mg | Freq: Two times a day (BID) | INTRAVENOUS | Status: AC
  Administered 2019-05-23: 20 mg via INTRAVENOUS

## 2019-05-23 MED ORDER — METOCLOPRAMIDE HCL 5 MG/ML IJ SOLN
10.0000 mg | Freq: Four times a day (QID) | INTRAMUSCULAR | Status: AC
  Administered 2019-05-23 – 2019-05-24 (×3): 10 mg via INTRAVENOUS
  Filled 2019-05-23 (×3): qty 2

## 2019-05-23 MED ORDER — SUCCINYLCHOLINE CHLORIDE 200 MG/10ML IV SOSY
PREFILLED_SYRINGE | INTRAVENOUS | Status: AC
  Filled 2019-05-23: qty 10

## 2019-05-23 MED ORDER — ASPIRIN EC 325 MG PO TBEC
325.0000 mg | DELAYED_RELEASE_TABLET | Freq: Every day | ORAL | Status: DC
  Administered 2019-05-24 – 2019-05-26 (×3): 325 mg via ORAL
  Filled 2019-05-23 (×3): qty 1

## 2019-05-23 MED ORDER — ACETAMINOPHEN 160 MG/5ML PO SOLN: 1000.0000 mg | Freq: Four times a day (QID) | ORAL | Status: DC

## 2019-05-23 MED ORDER — PHENYLEPHRINE 40 MCG/ML (10ML) SYRINGE FOR IV PUSH (FOR BLOOD PRESSURE SUPPORT)
PREFILLED_SYRINGE | INTRAVENOUS | Status: DC | PRN
  Administered 2019-05-23: 40 ug via INTRAVENOUS
  Administered 2019-05-23: 80 ug via INTRAVENOUS

## 2019-05-23 MED ORDER — OXYCODONE HCL 5 MG PO TABS
5.0000 mg | ORAL_TABLET | ORAL | Status: DC | PRN
  Administered 2019-05-27: 10 mg via ORAL
  Administered 2019-05-27: 5 mg via ORAL
  Administered 2019-05-29: 10 mg via ORAL
  Filled 2019-05-23 (×2): qty 2

## 2019-05-23 MED ORDER — EPHEDRINE 5 MG/ML INJ
INTRAVENOUS | Status: AC
  Filled 2019-05-23: qty 10

## 2019-05-23 MED ORDER — ACETAMINOPHEN 500 MG PO TABS
1000.0000 mg | ORAL_TABLET | Freq: Four times a day (QID) | ORAL | Status: AC
  Administered 2019-05-24 – 2019-05-28 (×19): 1000 mg via ORAL
  Filled 2019-05-23 (×17): qty 2

## 2019-05-23 MED ORDER — PHENYLEPHRINE HCL-NACL 10-0.9 MG/250ML-% IV SOLN
INTRAVENOUS | Status: DC | PRN
  Administered 2019-05-23: 30 ug/min via INTRAVENOUS

## 2019-05-23 MED ORDER — PLASMA-LYTE 148 IV SOLN
INTRAVENOUS | Status: DC | PRN
  Administered 2019-05-23: 500 mL via INTRAVASCULAR

## 2019-05-23 MED ORDER — ACETAMINOPHEN 650 MG RE SUPP
650.0000 mg | Freq: Once | RECTAL | Status: AC
  Administered 2019-05-23: 650 mg via RECTAL

## 2019-05-23 MED ORDER — HEPARIN SODIUM (PORCINE) 1000 UNIT/ML IJ SOLN
INTRAMUSCULAR | Status: AC
  Filled 2019-05-23: qty 1

## 2019-05-23 MED ORDER — SODIUM CHLORIDE 0.9% FLUSH
10.0000 mL | Freq: Two times a day (BID) | INTRAVENOUS | Status: DC
  Administered 2019-05-23 – 2019-05-24 (×3): 10 mL
  Administered 2019-05-24: 30 mL
  Administered 2019-05-25 – 2019-05-30 (×10): 10 mL

## 2019-05-23 MED ORDER — PROTAMINE SULFATE 10 MG/ML IV SOLN
INTRAVENOUS | Status: AC
  Filled 2019-05-23: qty 25

## 2019-05-23 MED ORDER — SODIUM CHLORIDE 0.45 % IV SOLN: INTRAVENOUS | Status: DC | PRN

## 2019-05-23 MED ORDER — HEMOSTATIC AGENTS (NO CHARGE) OPTIME
TOPICAL | Status: DC | PRN
  Administered 2019-05-23: 1 via TOPICAL

## 2019-05-23 MED ORDER — FENTANYL CITRATE (PF) 250 MCG/5ML IJ SOLN
INTRAMUSCULAR | Status: AC
  Filled 2019-05-23: qty 25

## 2019-05-23 MED ORDER — SODIUM CHLORIDE 0.9 % IR SOLN
Status: DC | PRN
  Administered 2019-05-23 (×5): 1000 mL

## 2019-05-23 MED ORDER — PROTAMINE SULFATE 10 MG/ML IV SOLN
INTRAVENOUS | Status: AC
  Filled 2019-05-23: qty 5

## 2019-05-23 MED ORDER — SODIUM CHLORIDE 0.9 % IV SOLN
INTRAVENOUS | Status: DC
  Administered 2019-05-25: 200 mL via INTRAVENOUS

## 2019-05-23 MED ORDER — METOPROLOL TARTRATE 12.5 MG HALF TABLET: 12.5000 mg | ORAL_TABLET | Freq: Two times a day (BID) | ORAL | Status: DC

## 2019-05-23 MED ORDER — ASPIRIN 81 MG PO CHEW
324.0000 mg | CHEWABLE_TABLET | Freq: Every day | ORAL | Status: DC
  Filled 2019-05-23 (×2): qty 4

## 2019-05-23 MED ORDER — PHENYLEPHRINE HCL-NACL 20-0.9 MG/250ML-% IV SOLN: 0.0000 ug/min | INTRAVENOUS | Status: DC

## 2019-05-23 MED ORDER — SODIUM CHLORIDE 0.9 % IV SOLN
1.5000 g | Freq: Two times a day (BID) | INTRAVENOUS | Status: AC
  Administered 2019-05-23 – 2019-05-25 (×4): 1.5 g via INTRAVENOUS
  Filled 2019-05-23 (×4): qty 1.5

## 2019-05-23 MED ORDER — SODIUM CHLORIDE 0.9 % IV SOLN
INTRAVENOUS | Status: DC | PRN
Start: 2019-05-23 — End: 2019-05-23

## 2019-05-23 MED ORDER — LIDOCAINE 2% (20 MG/ML) 5 ML SYRINGE
INTRAMUSCULAR | Status: AC
  Filled 2019-05-23: qty 5

## 2019-05-23 MED ORDER — METOPROLOL TARTRATE 25 MG/10 ML ORAL SUSPENSION: 12.5000 mg | Freq: Two times a day (BID) | ORAL | Status: DC

## 2019-05-23 MED ORDER — MAGNESIUM SULFATE 4 GM/100ML IV SOLN
4.0000 g | Freq: Once | INTRAVENOUS | Status: AC
  Administered 2019-05-23: 4 g via INTRAVENOUS

## 2019-05-23 MED ORDER — MILRINONE LACTATE IN DEXTROSE 20-5 MG/100ML-% IV SOLN: 0.2500 ug/kg/min | INTRAVENOUS | Status: DC

## 2019-05-23 MED ORDER — SODIUM CHLORIDE 0.9% IV SOLUTION: Freq: Once | INTRAVENOUS | Status: DC

## 2019-05-23 MED ORDER — DOPAMINE-DEXTROSE 3.2-5 MG/ML-% IV SOLN
3.0000 ug/kg/min | INTRAVENOUS | Status: DC
  Administered 2019-05-23: 6 ug/kg/min via INTRAVENOUS
  Filled 2019-05-23: qty 250

## 2019-05-23 MED ORDER — ACETAMINOPHEN 160 MG/5ML PO SOLN: 650.0000 mg | Freq: Once | ORAL | Status: AC

## 2019-05-23 MED ORDER — SODIUM BICARBONATE 8.4 % IV SOLN
50.0000 meq | Freq: Once | INTRAVENOUS | Status: AC
  Administered 2019-05-23: 50 meq via INTRAVENOUS

## 2019-05-23 MED ORDER — FENTANYL CITRATE (PF) 250 MCG/5ML IJ SOLN
INTRAMUSCULAR | Status: DC | PRN
  Administered 2019-05-23 (×2): 150 ug via INTRAVENOUS
  Administered 2019-05-23: 100 ug via INTRAVENOUS
  Administered 2019-05-23: 250 ug via INTRAVENOUS
  Administered 2019-05-23 (×2): 100 ug via INTRAVENOUS
  Administered 2019-05-23: 150 ug via INTRAVENOUS

## 2019-05-23 MED ORDER — NOREPINEPHRINE 4 MG/250ML-% IV SOLN
0.0000 ug/min | INTRAVENOUS | Status: DC
  Administered 2019-05-23: 5 ug/min via INTRAVENOUS
  Administered 2019-05-23: 10 ug/min via INTRAVENOUS
  Administered 2019-05-24: 9 ug/min via INTRAVENOUS
  Administered 2019-05-24 (×2): 10 ug/min via INTRAVENOUS
  Administered 2019-05-24: 12 ug/min via INTRAVENOUS
  Administered 2019-05-25: 8 ug/min via INTRAVENOUS
  Administered 2019-05-25: 10 ug/min via INTRAVENOUS
  Administered 2019-05-25: 13 ug/min via INTRAVENOUS
  Filled 2019-05-23 (×3): qty 250
  Filled 2019-05-23: qty 500
  Filled 2019-05-23 (×4): qty 250

## 2019-05-23 MED ORDER — ORAL CARE MOUTH RINSE
15.0000 mL | OROMUCOSAL | Status: DC
  Administered 2019-05-23 – 2019-05-24 (×10): 15 mL via OROMUCOSAL

## 2019-05-23 MED ORDER — DOCUSATE SODIUM 100 MG PO CAPS
200.0000 mg | ORAL_CAPSULE | Freq: Every day | ORAL | Status: DC
  Administered 2019-05-24 – 2019-06-01 (×8): 200 mg via ORAL
  Filled 2019-05-23 (×9): qty 2

## 2019-05-23 MED ORDER — MIDAZOLAM HCL (PF) 10 MG/2ML IJ SOLN
INTRAMUSCULAR | Status: AC
  Filled 2019-05-23: qty 2

## 2019-05-23 MED ORDER — SODIUM CHLORIDE (PF) 0.9 % IJ SOLN
OROMUCOSAL | Status: DC | PRN
  Administered 2019-05-23 (×3): 4 mL via TOPICAL

## 2019-05-23 MED ORDER — ALBUMIN HUMAN 5 % IV SOLN
250.0000 mL | INTRAVENOUS | Status: DC | PRN
  Administered 2019-05-23: 12.5 g via INTRAVENOUS

## 2019-05-23 MED ORDER — BISACODYL 5 MG PO TBEC
10.0000 mg | DELAYED_RELEASE_TABLET | Freq: Every day | ORAL | Status: DC
  Administered 2019-05-24 – 2019-05-30 (×7): 10 mg via ORAL
  Filled 2019-05-23 (×8): qty 2

## 2019-05-23 MED ORDER — ETOMIDATE 2 MG/ML IV SOLN
INTRAVENOUS | Status: DC | PRN
  Administered 2019-05-23: 10 mg via INTRAVENOUS

## 2019-05-23 MED ORDER — MAGNESIUM SULFATE 4 GM/100ML IV SOLN
INTRAVENOUS | Status: AC
  Administered 2019-05-23: 4 g
  Filled 2019-05-23: qty 100

## 2019-05-23 MED ORDER — DEXTROSE 50 % IV SOLN: 0.0000 mL | INTRAVENOUS | Status: DC | PRN

## 2019-05-23 MED ORDER — PROTAMINE SULFATE 10 MG/ML IV SOLN
INTRAVENOUS | Status: DC | PRN
  Administered 2019-05-23: 300 mg via INTRAVENOUS

## 2019-05-23 MED ORDER — TRAMADOL HCL 50 MG PO TABS: 50.0000 mg | ORAL_TABLET | ORAL | Status: DC | PRN

## 2019-05-23 MED ORDER — HEPARIN SODIUM (PORCINE) 1000 UNIT/ML IJ SOLN
INTRAMUSCULAR | Status: DC | PRN
  Administered 2019-05-23: 32000 [IU] via INTRAVENOUS

## 2019-05-23 MED ORDER — DEXMEDETOMIDINE HCL IN NACL 400 MCG/100ML IV SOLN
0.0000 ug/kg/h | INTRAVENOUS | Status: DC
  Administered 2019-05-23: 0.5 ug/kg/h via INTRAVENOUS
  Filled 2019-05-23: qty 100

## 2019-05-23 MED ORDER — MORPHINE SULFATE (PF) 2 MG/ML IV SOLN
1.0000 mg | INTRAVENOUS | Status: DC | PRN
  Administered 2019-05-24: 4 mg via INTRAVENOUS
  Administered 2019-05-29: 2 mg via INTRAVENOUS
  Filled 2019-05-23: qty 2
  Filled 2019-05-23: qty 1

## 2019-05-23 MED ORDER — DOPAMINE-DEXTROSE 3.2-5 MG/ML-% IV SOLN
INTRAVENOUS | Status: DC | PRN
Start: 2019-05-23 — End: 2019-05-23
  Administered 2019-05-23: 3 ug/kg/min via INTRAVENOUS

## 2019-05-23 MED ORDER — FAMOTIDINE IN NACL 20-0.9 MG/50ML-% IV SOLN
INTRAVENOUS | Status: AC
  Administered 2019-05-23: 20 mg via INTRAVENOUS
  Filled 2019-05-23: qty 50

## 2019-05-23 MED ORDER — NITROGLYCERIN IN D5W 200-5 MCG/ML-% IV SOLN: 0.0000 ug/min | INTRAVENOUS | Status: DC

## 2019-05-23 SURGICAL SUPPLY — 112 items
ADAPTER MULTI PERFUSION 15 (ADAPTER) ×3 IMPLANT
ARTICLIP LAA PROCLIP II 45 (Clip) ×3 IMPLANT
BAG DECANTER FOR FLEXI CONT (MISCELLANEOUS) ×3 IMPLANT
BLADE CLIPPER SURG (BLADE) IMPLANT
BLADE STERNUM SYSTEM 6 (BLADE) ×3 IMPLANT
BLADE SURG 11 STRL SS (BLADE) ×3 IMPLANT
BNDG ELASTIC 4X5.8 VLCR STR LF (GAUZE/BANDAGES/DRESSINGS) ×3 IMPLANT
BNDG ELASTIC 6X5.8 VLCR STR LF (GAUZE/BANDAGES/DRESSINGS) ×3 IMPLANT
BNDG GAUZE ELAST 4 BULKY (GAUZE/BANDAGES/DRESSINGS) ×3 IMPLANT
CABLE SURGICAL S-101-97-12 (CABLE) ×3 IMPLANT
CANISTER SUCT 3000ML PPV (MISCELLANEOUS) ×3 IMPLANT
CANNULA ARTERIAL VENT 3/8 20FR (CANNULA) ×3 IMPLANT
CANNULA EZ GLIDE 8.0 24FR (CANNULA) ×3 IMPLANT
CANNULA MC2 2 STG 29/37 NON-V (CANNULA) ×4 IMPLANT
CANNULA MC2 2 STG 36/46 NON-V (CANNULA) ×2 IMPLANT
CANNULA MC2 TWO STAGE (CANNULA) ×6
CANNULA VENOUS 2 STG 34/46 (CANNULA) ×3
CATH ROBINSON RED A/P 18FR (CATHETERS) ×6 IMPLANT
CATH THORACIC 28FR (CATHETERS) ×3 IMPLANT
CATH THORACIC 36FR (CATHETERS) ×3 IMPLANT
CATH THORACIC 36FR RT ANG (CATHETERS) ×3 IMPLANT
CLIP RETRACTION 3.0MM CORONARY (MISCELLANEOUS) IMPLANT
CLIP VESOCCLUDE MED 24/CT (CLIP) IMPLANT
CLIP VESOCCLUDE SM WIDE 24/CT (CLIP) ×6 IMPLANT
CONN ST 1/2X1/2  BEN (MISCELLANEOUS) ×3
CONN ST 1/2X1/2 BEN (MISCELLANEOUS) ×2 IMPLANT
CONNECTOR BLAKE 2:1 CARIO BLK (MISCELLANEOUS) IMPLANT
DEFOGGER ANTIFOG KIT (MISCELLANEOUS) ×3 IMPLANT
DERMABOND ADVANCED (GAUZE/BANDAGES/DRESSINGS) ×1
DERMABOND ADVANCED .7 DNX12 (GAUZE/BANDAGES/DRESSINGS) ×2 IMPLANT
DEVICE ATRICLIP LAA PRCLPII 45 (Clip) ×2 IMPLANT
DRAIN CHANNEL 19F RND (DRAIN) IMPLANT
DRAIN CONNECTOR BLAKE 1:1 (MISCELLANEOUS) IMPLANT
DRAPE CARDIOVASCULAR INCISE (DRAPES) ×3
DRAPE INCISE IOBAN 66X45 STRL (DRAPES) IMPLANT
DRAPE SLUSH/WARMER DISC (DRAPES) ×3 IMPLANT
DRAPE SRG 135X102X78XABS (DRAPES) ×2 IMPLANT
DRSG COVADERM 4X14 (GAUZE/BANDAGES/DRESSINGS) ×3 IMPLANT
DRSG COVADERM 4X8 (GAUZE/BANDAGES/DRESSINGS) ×3 IMPLANT
ELECT CAUTERY BLADE 6.4 (BLADE) ×6 IMPLANT
ELECT REM PT RETURN 9FT ADLT (ELECTROSURGICAL) ×6
ELECTRODE REM PT RTRN 9FT ADLT (ELECTROSURGICAL) ×4 IMPLANT
FELT TEFLON 1X6 (MISCELLANEOUS) ×3 IMPLANT
GAUZE SPONGE 4X4 12PLY STRL (GAUZE/BANDAGES/DRESSINGS) ×6 IMPLANT
GAUZE SPONGE 4X4 12PLY STRL LF (GAUZE/BANDAGES/DRESSINGS) ×6 IMPLANT
GLOVE BIO SURGEON STRL SZ 6.5 (GLOVE) ×12 IMPLANT
GLOVE BIO SURGEON STRL SZ7 (GLOVE) ×9 IMPLANT
GLOVE BIO SURGEON STRL SZ7.5 (GLOVE) ×9 IMPLANT
GLOVE BIOGEL M STRL SZ7.5 (GLOVE) ×6 IMPLANT
GLOVE BIOGEL PI IND STRL 6.5 (GLOVE) ×2 IMPLANT
GLOVE BIOGEL PI IND STRL 7.5 (GLOVE) ×6 IMPLANT
GLOVE BIOGEL PI INDICATOR 6.5 (GLOVE) ×1
GLOVE BIOGEL PI INDICATOR 7.5 (GLOVE) ×3
GLOVE SURG SS PI 7.0 STRL IVOR (GLOVE) ×12 IMPLANT
GLOVE TRIUMPH SURG SIZE 7.0 (KITS) ×6 IMPLANT
GOWN STRL REUS W/ TWL LRG LVL3 (GOWN DISPOSABLE) ×18 IMPLANT
GOWN STRL REUS W/ TWL XL LVL3 (GOWN DISPOSABLE) ×2 IMPLANT
GOWN STRL REUS W/TWL LRG LVL3 (GOWN DISPOSABLE) ×27
GOWN STRL REUS W/TWL XL LVL3 (GOWN DISPOSABLE) ×3
HEMOSTAT POWDER SURGIFOAM 1G (HEMOSTASIS) ×9 IMPLANT
HEMOSTAT SURGICEL 2X14 (HEMOSTASIS) ×3 IMPLANT
INSERT FOGARTY XLG (MISCELLANEOUS) ×3 IMPLANT
IV CATH 22GX1 FEP (IV SOLUTION) ×3 IMPLANT
KIT BASIN OR (CUSTOM PROCEDURE TRAY) ×3 IMPLANT
KIT SUCTION CATH 14FR (SUCTIONS) ×3 IMPLANT
KIT TURNOVER KIT B (KITS) ×3 IMPLANT
KIT VASOVIEW HEMOPRO 2 VH 4000 (KITS) ×3 IMPLANT
LEAD PACING MYOCARDI (MISCELLANEOUS) ×3 IMPLANT
MARKER GRAFT CORONARY BYPASS (MISCELLANEOUS) ×9 IMPLANT
NS IRRIG 1000ML POUR BTL (IV SOLUTION) ×15 IMPLANT
PACK ACCESSORY CANNULA KIT (KITS) ×3 IMPLANT
PACK E OPEN HEART (SUTURE) ×3 IMPLANT
PACK OPEN HEART (CUSTOM PROCEDURE TRAY) ×3 IMPLANT
PAD ARMBOARD 7.5X6 YLW CONV (MISCELLANEOUS) ×6 IMPLANT
PAD ELECT DEFIB RADIOL ZOLL (MISCELLANEOUS) ×3 IMPLANT
PENCIL BUTTON HOLSTER BLD 10FT (ELECTRODE) ×6 IMPLANT
POSITIONER HEAD DONUT 9IN (MISCELLANEOUS) ×3 IMPLANT
PUNCH AORTIC ROTATE 4.0MM (MISCELLANEOUS) ×3 IMPLANT
SENSOR MYOCARDIAL TEMP (MISCELLANEOUS) ×3 IMPLANT
SET CARDIOPLEGIA MPS 5001102 (MISCELLANEOUS) ×3 IMPLANT
SPONGE LAP 18X18 X RAY DECT (DISPOSABLE) ×3 IMPLANT
SPONGE LAP 4X18 RFD (DISPOSABLE) ×3 IMPLANT
SUPPORT HEART JANKE-BARRON (MISCELLANEOUS) ×3 IMPLANT
SUT BONE WAX W31G (SUTURE) IMPLANT
SUT ETHIBOND X763 2 0 SH 1 (SUTURE) ×3 IMPLANT
SUT MNCRL AB 3-0 PS2 18 (SUTURE) IMPLANT
SUT MNCRL AB 4-0 PS2 18 (SUTURE) ×3 IMPLANT
SUT PDS AB 1 CTX 36 (SUTURE) ×6 IMPLANT
SUT PROLENE 3 0 SH DA (SUTURE) ×3 IMPLANT
SUT PROLENE 4 0 SH DA (SUTURE) ×3 IMPLANT
SUT PROLENE 5 0 C 1 36 (SUTURE) ×9 IMPLANT
SUT PROLENE 6 0 C 1 30 (SUTURE) ×6 IMPLANT
SUT PROLENE 7 0 BV1 MDA (SUTURE) ×6 IMPLANT
SUT SILK 2 0 SH CR/8 (SUTURE) ×3 IMPLANT
SUT STEEL 6MS V (SUTURE) IMPLANT
SUT STEEL SZ 6 DBL 3X14 BALL (SUTURE) ×9 IMPLANT
SUT VIC AB 1 CTX 36 (SUTURE) ×6
SUT VIC AB 1 CTX36XBRD ANBCTR (SUTURE) ×4 IMPLANT
SUT VIC AB 2-0 CT1 27 (SUTURE) ×3
SUT VIC AB 2-0 CT1 TAPERPNT 27 (SUTURE) ×2 IMPLANT
SYSTEM SAHARA CHEST DRAIN ATS (WOUND CARE) ×3 IMPLANT
TAPE CLOTH SURG 4X10 WHT LF (GAUZE/BANDAGES/DRESSINGS) ×3 IMPLANT
TAPE PAPER 3X10 WHT MICROPORE (GAUZE/BANDAGES/DRESSINGS) ×3 IMPLANT
TOWEL GREEN STERILE (TOWEL DISPOSABLE) IMPLANT
TOWEL GREEN STERILE FF (TOWEL DISPOSABLE) ×6 IMPLANT
TRAY FOLEY SLVR 16FR TEMP STAT (SET/KITS/TRAYS/PACK) ×3 IMPLANT
TUBE SUCT INTRACARD DLP 20F (MISCELLANEOUS) ×3 IMPLANT
TUBE SUCTION CARDIAC 10FR (CANNULA) ×3 IMPLANT
TUBING LAP HI FLOW INSUFFLATIO (TUBING) ×3 IMPLANT
UNDERPAD 30X36 HEAVY ABSORB (UNDERPADS AND DIAPERS) ×3 IMPLANT
WATER STERILE IRR 1000ML POUR (IV SOLUTION) ×6 IMPLANT
YANKAUER SUCT BULB TIP NO VENT (SUCTIONS) ×3 IMPLANT

## 2019-05-23 NOTE — Brief Op Note (Signed)
05/21/2019 - 05/23/2019  12:36 PM  PATIENT:  Max Rivas  69 y.o. male  PRE-OPERATIVE DIAGNOSIS:  coronary artery disease  POST-OPERATIVE DIAGNOSIS:  coronary artery disease  PROCEDURE:  Procedure(s) with comments:  CORONARY ARTERY BYPASS GRAFTING x 3 -LIMA to LAD -SVG to OM  -SVG to DIAGONAL  CLIPPING OF ATRIAL APPENDAGE with atriclip (N/A)  ENDOSCOPIC HARVEST GREATER SAPHENOUS VEIN -Right Leg  TRANSESOPHAGEAL ECHOCARDIOGRAM (TEE) (N/A)  SURGEON:  Surgeon(s) and Role:    * Bartle, Payton Doughty, MD - Primary  PHYSICIAN ASSISTANT: Truly Stankiewicz PA-C  ANESTHESIA:   general  EBL:  1000 mL   BLOOD ADMINISTERED: CELLSAVER  DRAINS: Left Pleural Chest Tube, Mediastinal Chest Drain   LOCAL MEDICATIONS USED:  NONE  SPECIMEN:  No Specimen  DISPOSITION OF SPECIMEN:  N/A  COUNTS:  YES  TOURNIQUET:  * No tourniquets in log *  DICTATION: .Dragon Dictation  PLAN OF CARE: Admit to inpatient   PATIENT DISPOSITION:  ICU - intubated and hemodynamically stable.   Delay start of Pharmacological VTE agent (>24hrs) due to surgical blood loss or risk of bleeding: yes

## 2019-05-23 NOTE — Progress Notes (Signed)
T[facility] BRIEF SICU PROGRESS NOTE  Day of Surgery  S/P Procedure(s) (LRB): CORONARY ARTERY BYPASS GRAFTING (CABG), times three, using right greater saphenous vein and left internal mammary (N/A) CLIPPING OF ATRIAL APPENDAGE with atriclip (N/A) TRANSESOPHAGEAL ECHOCARDIOGRAM (TEE) (N/A)   Extubated earlier per protocol but reintubated due to somnolence with hypercarbic respiratory failure Currently sleepy but arousable on vent Stable hemodynamics on levophed, dopamine and milrinone drips Chest tube output low UOP adequate  Plan: Continue routine early postop but hold off on vent weaning until fully alert  Purcell Nails, MD 05/23/2019 7:02 PM

## 2019-05-23 NOTE — Progress Notes (Signed)
  Echocardiogram Echocardiogram Transesophageal has been performed.  Max Rivas 05/23/2019, 9:31 AM

## 2019-05-23 NOTE — Procedures (Signed)
Extubation Procedure Note  Patient Details:   Name: Max Rivas DOB: 04/06/1950 MRN: [id number]   Airway Documentation:    Vent end date: 05/23/19 Vent end time: 1711   Evaluation  O2 sats: stable throughout Complications: No apparent complications Patient did tolerate procedure well. Bilateral Breath Sounds: Clear, Diminished   Yes, Pt could speak post extubation.  Prior to extubation, NIF and VC maneuvers were performed with marginal results.  Dr. Laneta Simmers was consulted and orders were received to proceed with extubation and bridge with BiPAP if needed.  At this time pt is on 6 l/m [place] with BiPAP at beside for prn use.  Audrie Lia 05/23/2019, 5:12 PM

## 2019-05-23 NOTE — Anesthesia Procedure Notes (Signed)
Procedure Name: Intubation Date/Time: 05/23/2019 5:32 PM Performed by: De Nurse, CRNA Pre-anesthesia Checklist: Patient identified, Emergency Drugs available, Suction available and Patient being monitored Patient Re-evaluated:Patient Re-evaluated prior to induction Oxygen Delivery Method: Circle System Utilized Preoxygenation: Pre-oxygenation with 100% oxygen Induction Type: IV induction Ventilation: Mask ventilation without difficulty Laryngoscope Size: Glidescope and 4 Grade View: Grade I Tube type: Oral Tube size: 8.0 mm Number of attempts: 1 Airway Equipment and Method: Stylet and Oral airway Placement Confirmation: ETT inserted through vocal cords under direct vision,  positive ETCO2 and breath sounds checked- equal and bilateral Secured at: 24 cm Tube secured with: Tape Dental Injury: Teeth and Oropharynx as per pre-operative assessment

## 2019-05-23 NOTE — Anesthesia Procedure Notes (Signed)
Central Venous Catheter Insertion Performed by: Nolon Nations, MD, anesthesiologist Start/End5/11/2019 7:48 AM, 05/23/2019 7:58 AM Patient location: Pre-op. Preanesthetic checklist: patient identified, IV checked, site marked, risks and benefits discussed, surgical consent, monitors and equipment checked, pre-op evaluation, timeout performed and anesthesia consent Lidocaine 1% used for infiltration and patient sedated Hand hygiene performed  and maximum sterile barriers used  Catheter size: 9 Fr Central line was placed.MAC introducer PA Cath depth:50 Procedure performed using ultrasound guided technique. Ultrasound Notes:anatomy identified, needle tip was noted to be adjacent to the nerve/plexus identified, no ultrasound evidence of intravascular and/or intraneural injection and image(s) printed for medical record Attempts: 1 Following insertion, line sutured, dressing applied and Biopatch. Post procedure assessment: blood return through all ports, free fluid flow and no air  Patient tolerated the procedure well with no immediate complications.

## 2019-05-23 NOTE — Transfer of Care (Signed)
Immediate Anesthesia Transfer of Care Note  Patient: Max Rivas  Procedure(s) Performed: CORONARY ARTERY BYPASS GRAFTING (CABG), times three, using right greater saphenous vein and left internal mammary (N/A Chest) CLIPPING OF ATRIAL APPENDAGE with atriclip (N/A ) TRANSESOPHAGEAL ECHOCARDIOGRAM (TEE) (N/A )  Patient Location: SICU  Anesthesia Type:General  Level of Consciousness: sedated, unresponsive and Patient remains intubated per anesthesia plan  Airway & Oxygen Therapy: Patient remains intubated per anesthesia plan and Patient placed on Ventilator (see vital sign flow sheet for setting)  Post-op Assessment: Report given to RN and Post -op Vital signs reviewed and stable  Post vital signs: Reviewed and stable  Last Vitals:  Vitals Value Taken Time  BP 96/58 05/23/19 1315  Temp 36.2 C 05/23/19 1318  Pulse 78 05/23/19 1318  Resp 8 05/23/19 1318  SpO2 91 % 05/23/19 1318  Vitals shown include unvalidated device data.  Last Pain:  Vitals:   05/23/19 0414  TempSrc: Oral  PainSc:       Patients Stated Pain Goal: 0 (05/22/19 0800)  Complications: No apparent anesthesia complications

## 2019-05-23 NOTE — OR Nursing (Signed)
1st call to ICU @ 1202 2nd call to ICU @ 1232 Last call to ICU @1302 

## 2019-05-23 NOTE — Anesthesia Procedure Notes (Signed)
Arterial Line Insertion Start/End5/11/2019 7:00 AM, 05/23/2019 7:15 AM Performed by: Nils Pyle, CRNA, CRNA  Patient location: Pre-op. Preanesthetic checklist: patient identified, IV checked, site marked, risks and benefits discussed, surgical consent, monitors and equipment checked, pre-op evaluation and anesthesia consent Lidocaine 1% used for infiltration Left, radial was placed Catheter size: 20 G Hand hygiene performed  and maximum sterile barriers used  Allen's test indicative of satisfactory collateral circulation Attempts: 3 Procedure performed without using ultrasound guided technique. Ultrasound Notes:anatomy identified, needle tip was noted to be adjacent to the nerve/plexus identified and no ultrasound evidence of intravascular and/or intraneural injection Following insertion, dressing applied and Biopatch. Post procedure assessment: normal  Patient tolerated the procedure well with no immediate complications.

## 2019-05-23 NOTE — Op Note (Signed)
CARDIOVASCULAR SURGERY OPERATIVE NOTE  05/23/2019  Surgeon:   K. , MD  First Assistant: Erin Barrett, PA-C   Preoperative Diagnosis:  Severe multi-vessel coronary artery disease   Postoperative Diagnosis:  Same   Procedure:  1. Median Sternotomy 2. Extracorporeal circulation 3.   Coronary artery bypass grafting x 3   Left internal mammary artery graft to the LAD  SVG to diagonal  SVG to OM  4.   Endoscopic vein harvest from the right leg 5.   Clipping of left atrial appendage   Anesthesia:  General Endotracheal   Clinical History/Surgical Indication:  The patient is a 69-year-old gentleman with a history of coronary artery disease status post multiple prior stents, ischemic cardiomyopathy, combined chronic systolic and diastolic congestive heart failure, ventricular tachycardias status post ICD implant, paroxysmal atrial fibrillation, ongoing smoking with severe COPD and baseline hypercarbia and hypoxemia, hyperlipidemia, OSA intolerant of CPAP, who was admitted April 29 to May 18, 2019 with ventricular tachycardia.  Cardiac catheterization showed significant coronary disease with high-grade native and diffuse in-stent restenosis in the ostial to proximal left circumflex of 95 to 99%.  There was also a 90% stenosis beyond the stented segment supplying a single large marginal branch.  The LAD had eccentric bulky and calcified 50 to 65% proximal to mid vessel stenosis beyond the origin of a large first diagonal branch.  There was eccentric bulky and calcified ostial to proximal narrowing of the first diagonal estimated 70%.  Both LAD and diagonal were mildly hemodynamically significant based on RFR with values of 0.89 and 0.87 respectively.  The right coronary was diffusely diseased but had no focal high-grade obstruction.  LVEDP was 10.  An echocardiogram on 05/13/2019 showed an  ejection fraction of 40 to 45% with grade 2 diastolic dysfunction.  The mitral valve appeared unremarkable with mild mitral regurgitation.  There was no aortic stenosis or insufficiency.  The patient was evaluated by Dr. Lightfoot and coronary bypass surgery was recommended.  The patient declined and was discharged.  He now returns with 9 out of 10 chest pain with initial troponins being negative.  He was on aspirin and Eliquis.  He decided to proceed with coronary bypass surgery and his Eliquis was stopped.  He was on the schedule this morning with Dr. Lightfoot but due to an emergency surgery that Dr. Lightfoot needed to do he asked me to perform the above procedure.  I discussed this with Mr. Luger and he agreed to proceed.  Preparation:  The patient was seen in the preoperative holding area and the correct patient, correct operation were confirmed with the patient after reviewing the medical record and catheterization. The consent was signed by me. Preoperative antibiotics were given. A pulmonary arterial line and radial arterial line were placed by the anesthesia team. The patient was taken back to the operating room and positioned supine on the operating room table. After being placed under general endotracheal anesthesia by the anesthesia team a foley catheter was placed. The neck, chest, abdomen, and both legs were prepped with betadine soap and solution and draped in the usual sterile manner. A surgical time-out was taken and the correct patient and operative procedure were confirmed with the nursing and anesthesia staff.  TEE: Performed by Dr. John Germeroth  This showed severe left ventricular systolic dysfunction with moderate to severe central mitral regurgitation.  The mitral valve appeared structurally normal but there appeared to be tethering of the posterior leaflet due to left ventricular dysfunction probably related to   prior infarct or ischemia.  His preoperative 2D echocardiogram only  showed mild mitral regurgitation.  There was no flow reversal in the pulmonary veins.  His left atrium was small.  His pulmonary artery pressures were elevated in the operating room and fluctuated widely suggesting ongoing ischemia.  Given the severity of his COPD and large deep barrel chest and recent mild mitral regurgitation on 2D echocardiogram I felt it would best to leave his mitral valve alone and see how much revascularization improved this.   Cardiopulmonary Bypass:   A median sternotomy was performed. He had markedly hyperinflated lungs that met in the midline under the sternum and were full of blebs.The pericardium was opened in the midline. Right ventricular function appeared normal. The ascending aorta was of normal size and had some calcified palpable plaque adjacent to the innominate artery. The aortic arch was soft. There were no contraindications to aortic cannulation or cross-clamping. The patient was fully systemically heparinized and the ACT was maintained > 400 sec. The proximal aortic arch was cannulated with a 20 F aortic cannula for arterial inflow. Venous cannulation was performed via the right atrial appendage using a two-staged venous cannula. An antegrade cardioplegia/vent cannula was inserted into the mid-ascending aorta. Aortic occlusion was performed with a single cross-clamp. Systemic cooling to 32 degrees Centigrade and topical cooling of the heart with iced saline were used. Hyperkalemic antegrade cold blood cardioplegia was used to induce diastolic arrest and was then given at about 20 minute intervals throughout the period of arrest to maintain myocardial temperature at or below 10 degrees centigrade. A temperature probe was inserted into the interventricular septum and an insulating pad was placed in the pericardium.   Left internal mammary artery harvest:  The left side of the sternum was retracted using the Rultract retractor. The left internal mammary artery was  harvested as a pedicle graft. All side branches were clipped. It was a medium-sized vessel of good quality with excellent blood flow. It was ligated distally and divided. It was sprayed with topical papaverine solution to prevent vasospasm.   Endoscopic vein harvest:  The right greater saphenous vein was harvested endoscopically through a 2 cm incision medial to the right knee. It was harvested from the upper thigh to below the knee. It was a medium-sized vein of good quality. The side branches were all ligated with 4-0 silk ties.    Coronary arteries:  The coronary arteries were examined.   LAD:  Large vessel with no distal disease. Diagonal was moderate sized vessel with no distal disease.  LCX:  The OM was a large vessel with mild distal disease. There was extensive infarct of the lateral wall with thinning of the myocardium.  RCA:  Diffusely calcified.   Grafts:  1. LIMA to the LAD: 2.5 mm. It was sewn end to side using 8-0 prolene continuous suture. 2. SVG to Diagonal:  1.75 mm. It was sewn end to side using 7-0 prolene continuous suture. 3. SVG to OM:  1.75 mm. It was sewn end to side using 7-0 prolene continuous suture.   The proximal vein graft anastomoses were performed to the mid-ascending aorta using continuous 6-0 prolene suture. Graft markers were placed around the proximal anastomoses.   Ligation of left atrial appendage:   The base of the appendage was measured and a 45 mm Atricure clip was chosen. This was placed across the base of the LAA without difficulty.     Completion:  The patient was rewarmed to 37 degrees   Centigrade. The clamp was removed from the LIMA pedicle and there was rapid warming of the septum and return of ventricular fibrillation. The crossclamp was removed with a time of 73 minutes. There was spontaneous return of sinus rhythm. The distal and proximal anastomoses were checked for hemostasis. The position of the grafts was satisfactory. Two  temporary epicardial pacing wires were placed on the right atrium and two on the right ventricle. The patient was weaned from CPB without difficulty on milrinone 0.25 and dopamine 3.  CPB time was 96 minutes. Cardiac output was 5 LPM. TEE showed significant improvement in LV systolic function. MR was moderate central. PA pressures were normal. Heparin was fully reversed with protamine and the aortic and venous cannulas removed. Hemostasis was achieved. Mediastinal and left pleural drainage tubes were placed. The sternum was closed with double #6 stainless steel wires. The fascia was closed with continuous # 1 vicryl suture. The subcutaneous tissue was closed with 2-0 vicryl continuous suture. The skin was closed with 3-0 vicryl subcuticular suture. All sponge, needle, and instrument counts were reported correct at the end of the case. Dry sterile dressings were placed over the incisions and around the chest tubes which were connected to pleurevac suction. The patient was then transported to the surgical intensive care unit in stable condition.    

## 2019-05-23 NOTE — Progress Notes (Signed)
Patient ID: Max Rivas, male   DOB: November 07, 1950, 69 y.o.   MRN: [id number] TCTS:  Dr. Cliffton Asters asked me to do [name]'s surgery this am because he is tied up with an emergency. I reviewed his history and cath films. Agree with need for CABG and left atrial clip for PAF. [name] agrees to proceed this am with me doing his surgery.

## 2019-05-23 NOTE — Anesthesia Procedure Notes (Signed)
Central Venous Catheter Insertion Performed by: Lewie Loron, MD, anesthesiologist Patient location: Pre-op. Preanesthetic checklist: patient identified, IV checked, site marked, risks and benefits discussed, surgical consent, monitors and equipment checked, pre-op evaluation, timeout performed and anesthesia consent Hand hygiene performed  and maximum sterile barriers used  PA cath was placed.Swan type:thermodilution and oximetry PA Cath depth:50 Procedure performed without using ultrasound guided technique. Attempts: 1 Patient tolerated the procedure well with no immediate complications.

## 2019-05-23 NOTE — Anesthesia Procedure Notes (Signed)
Procedure Name: Intubation Date/Time: 05/23/2019 7:44 AM Performed by: Nils Pyle, CRNA Pre-anesthesia Checklist: Patient identified, Emergency Drugs available, Suction available and Patient being monitored Patient Re-evaluated:Patient Re-evaluated prior to induction Oxygen Delivery Method: Circle System Utilized Preoxygenation: Pre-oxygenation with 100% oxygen Induction Type: IV induction Ventilation: Mask ventilation with difficulty, Two handed mask ventilation required and Oral airway inserted - appropriate to patient size Laryngoscope Size: Glidescope and 4 Grade View: Grade I Tube type: Oral Tube size: 8.0 mm Number of attempts: 1 Airway Equipment and Method: Stylet and Oral airway Placement Confirmation: ETT inserted through vocal cords under direct vision,  positive ETCO2 and breath sounds checked- equal and bilateral Secured at: 23 cm Tube secured with: Tape Dental Injury: Teeth and Oropharynx as per pre-operative assessment

## 2019-05-24 ENCOUNTER — Encounter: Payer: Self-pay | Admitting: *Deleted

## 2019-05-24 ENCOUNTER — Inpatient Hospital Stay (HOSPITAL_COMMUNITY): Payer: No Typology Code available for payment source

## 2019-05-24 LAB — CBC
HCT: 45.9 % (ref 39.0–52.0)
HCT: 43.6 % (ref 39.0–52.0)
Hemoglobin: 15.3 g/dL (ref 13.0–17.0)
Hemoglobin: 14.9 g/dL (ref 13.0–17.0)
MCH: 30.8 pg (ref 26.0–34.0)
MCH: 31.5 pg (ref 26.0–34.0)
MCHC: 33.3 g/dL (ref 30.0–36.0)
MCHC: 34.2 g/dL (ref 30.0–36.0)
MCV: 92.5 fL (ref 80.0–100.0)
MCV: 92.2 fL (ref 80.0–100.0)
Platelets: 133 10*3/uL — ABNORMAL LOW (ref 150–400)
Platelets: 122 10*3/uL — ABNORMAL LOW (ref 150–400)
RBC: 4.96 MIL/uL (ref 4.22–5.81)
RBC: 4.73 MIL/uL (ref 4.22–5.81)
RDW: 14.5 % (ref 11.5–15.5)
RDW: 14.5 % (ref 11.5–15.5)
WBC: 17.4 10*3/uL — ABNORMAL HIGH (ref 4.0–10.5)
WBC: 19.8 10*3/uL — ABNORMAL HIGH (ref 4.0–10.5)
nRBC: 0 % (ref 0.0–0.2)
nRBC: 0 % (ref 0.0–0.2)

## 2019-05-24 LAB — TYPE AND SCREEN
ABO/RH(D): A POS
Antibody Screen: NEGATIVE
Unit division: 0
Unit division: 0

## 2019-05-24 LAB — POCT I-STAT 7, (LYTES, BLD GAS, ICA,H+H)
Acid-Base Excess: 1 mmol/L (ref 0.0–2.0)
Acid-Base Excess: 0 mmol/L (ref 0.0–2.0)
Acid-Base Excess: 0 mmol/L (ref 0.0–2.0)
Bicarbonate: 26.4 mmol/L (ref 20.0–28.0)
Bicarbonate: 25 mmol/L (ref 20.0–28.0)
Bicarbonate: 26.2 mmol/L (ref 20.0–28.0)
Calcium, Ion: 1.15 mmol/L (ref 1.15–1.40)
Calcium, Ion: 1.16 mmol/L (ref 1.15–1.40)
Calcium, Ion: 1.15 mmol/L (ref 1.15–1.40)
HCT: 45 % (ref 39.0–52.0)
HCT: 46 % (ref 39.0–52.0)
HCT: 43 % (ref 39.0–52.0)
Hemoglobin: 15.3 g/dL (ref 13.0–17.0)
Hemoglobin: 15.6 g/dL (ref 13.0–17.0)
Hemoglobin: 14.6 g/dL (ref 13.0–17.0)
O2 Saturation: 96 %
O2 Saturation: 95 %
O2 Saturation: 98 %
Patient temperature: 37.8
Patient temperature: 37.8
Patient temperature: 37.9
Potassium: 4.2 mmol/L (ref 3.5–5.1)
Potassium: 4.3 mmol/L (ref 3.5–5.1)
Potassium: 4.3 mmol/L (ref 3.5–5.1)
Sodium: 139 mmol/L (ref 135–145)
Sodium: 139 mmol/L (ref 135–145)
Sodium: 139 mmol/L (ref 135–145)
TCO2: 28 mmol/L (ref 22–32)
TCO2: 26 mmol/L (ref 22–32)
TCO2: 28 mmol/L (ref 22–32)
pCO2 arterial: 47.2 mmHg (ref 32.0–48.0)
pCO2 arterial: 44.3 mmHg (ref 32.0–48.0)
pCO2 arterial: 48.3 mmHg — ABNORMAL HIGH (ref 32.0–48.0)
pH, Arterial: 7.359 (ref 7.350–7.450)
pH, Arterial: 7.364 (ref 7.350–7.450)
pH, Arterial: 7.347 — ABNORMAL LOW (ref 7.350–7.450)
pO2, Arterial: 93 mmHg (ref 83.0–108.0)
pO2, Arterial: 85 mmHg (ref 83.0–108.0)
pO2, Arterial: 113 mmHg — ABNORMAL HIGH (ref 83.0–108.0)

## 2019-05-24 LAB — BPAM PLATELET PHERESIS
Blood Product Expiration Date: 202105122359
ISSUE DATE / TIME: 202105111130
Unit Type and Rh: 7300

## 2019-05-24 LAB — GLUCOSE, CAPILLARY
Glucose-Capillary: 111 mg/dL — ABNORMAL HIGH (ref 70–99)
Glucose-Capillary: 117 mg/dL — ABNORMAL HIGH (ref 70–99)
Glucose-Capillary: 121 mg/dL — ABNORMAL HIGH (ref 70–99)
Glucose-Capillary: 122 mg/dL — ABNORMAL HIGH (ref 70–99)
Glucose-Capillary: 125 mg/dL — ABNORMAL HIGH (ref 70–99)
Glucose-Capillary: 126 mg/dL — ABNORMAL HIGH (ref 70–99)
Glucose-Capillary: 129 mg/dL — ABNORMAL HIGH (ref 70–99)
Glucose-Capillary: 131 mg/dL — ABNORMAL HIGH (ref 70–99)
Glucose-Capillary: 131 mg/dL — ABNORMAL HIGH (ref 70–99)
Glucose-Capillary: 137 mg/dL — ABNORMAL HIGH (ref 70–99)
Glucose-Capillary: 140 mg/dL — ABNORMAL HIGH (ref 70–99)
Glucose-Capillary: 141 mg/dL — ABNORMAL HIGH (ref 70–99)
Glucose-Capillary: 142 mg/dL — ABNORMAL HIGH (ref 70–99)
Glucose-Capillary: 145 mg/dL — ABNORMAL HIGH (ref 70–99)
Glucose-Capillary: 162 mg/dL — ABNORMAL HIGH (ref 70–99)
Glucose-Capillary: 91 mg/dL (ref 70–99)

## 2019-05-24 LAB — BPAM RBC
Blood Product Expiration Date: 202106042359
Blood Product Expiration Date: 202106042359
Unit Type and Rh: 6200
Unit Type and Rh: 6200

## 2019-05-24 LAB — PREPARE PLATELET PHERESIS: Unit division: 0

## 2019-05-24 LAB — BASIC METABOLIC PANEL
Anion gap: 10 (ref 5–15)
Anion gap: 6 (ref 5–15)
BUN: 11 mg/dL (ref 8–23)
BUN: 11 mg/dL (ref 8–23)
CO2: 24 mmol/L (ref 22–32)
CO2: 28 mmol/L (ref 22–32)
Calcium: 7.9 mg/dL — ABNORMAL LOW (ref 8.9–10.3)
Calcium: 8.2 mg/dL — ABNORMAL LOW (ref 8.9–10.3)
Chloride: 104 mmol/L (ref 98–111)
Chloride: 103 mmol/L (ref 98–111)
Creatinine, Ser: 1.08 mg/dL (ref 0.61–1.24)
Creatinine, Ser: 1.28 mg/dL — ABNORMAL HIGH (ref 0.61–1.24)
GFR calc Af Amer: >60 mL/min (ref >60)
GFR calc Af Amer: >60 mL/min (ref >60)
GFR calc non Af Amer: >60 mL/min (ref >60)
GFR calc non Af Amer: 58 mL/min — ABNORMAL LOW (ref >60)
Glucose, Bld: 119 mg/dL — ABNORMAL HIGH (ref 70–99)
Glucose, Bld: 159 mg/dL — ABNORMAL HIGH (ref 70–99)
Potassium: 4.3 mmol/L (ref 3.5–5.1)
Potassium: 4.2 mmol/L (ref 3.5–5.1)
Sodium: 138 mmol/L (ref 135–145)
Sodium: 137 mmol/L (ref 135–145)

## 2019-05-24 LAB — COOXEMETRY PANEL
Carboxyhemoglobin: 1.5 % (ref 0.5–1.5)
Carboxyhemoglobin: 1.6 % — ABNORMAL HIGH (ref 0.5–1.5)
Methemoglobin: 1.3 % (ref 0.0–1.5)
Methemoglobin: 1.4 % (ref 0.0–1.5)
O2 Saturation: 73.1 %
O2 Saturation: 79 %
Total hemoglobin: 14.9 g/dL (ref 12.0–16.0)
Total hemoglobin: 15.3 g/dL (ref 12.0–16.0)

## 2019-05-24 LAB — MAGNESIUM
Magnesium: 2.2 mg/dL (ref 1.7–2.4)
Magnesium: 2 mg/dL (ref 1.7–2.4)

## 2019-05-24 MED ORDER — ORAL CARE MOUTH RINSE
15.0000 mL | Freq: Two times a day (BID) | OROMUCOSAL | Status: DC
  Administered 2019-05-24 – 2019-06-01 (×9): 15 mL via OROMUCOSAL

## 2019-05-24 MED ORDER — FUROSEMIDE 10 MG/ML IJ SOLN
40.0000 mg | Freq: Once | INTRAMUSCULAR | Status: AC
  Administered 2019-05-24: 40 mg via INTRAVENOUS
  Filled 2019-05-24: qty 4

## 2019-05-24 MED ORDER — AMIODARONE HCL IN DEXTROSE 360-4.14 MG/200ML-% IV SOLN
30.0000 mg/h | INTRAVENOUS | Status: AC
  Administered 2019-05-25 – 2019-05-27 (×5): 30 mg/h via INTRAVENOUS
  Filled 2019-05-24 (×4): qty 200

## 2019-05-24 MED ORDER — INSULIN ASPART 100 UNIT/ML ~~LOC~~ SOLN
2.0000 [IU] | SUBCUTANEOUS | Status: DC
  Administered 2019-05-24: 2 [IU] via SUBCUTANEOUS
  Administered 2019-05-25 (×2): 4 [IU] via SUBCUTANEOUS
  Administered 2019-05-25: 6 [IU] via SUBCUTANEOUS
  Administered 2019-05-25: 2 [IU] via SUBCUTANEOUS
  Administered 2019-05-25 (×2): 4 [IU] via SUBCUTANEOUS
  Administered 2019-05-25: 2 [IU] via SUBCUTANEOUS

## 2019-05-24 MED ORDER — MIDODRINE HCL 5 MG PO TABS
10.0000 mg | ORAL_TABLET | Freq: Three times a day (TID) | ORAL | Status: DC
  Administered 2019-05-24 – 2019-05-30 (×19): 10 mg via ORAL
  Filled 2019-05-24 (×19): qty 2

## 2019-05-24 MED ORDER — DOPAMINE-DEXTROSE 3.2-5 MG/ML-% IV SOLN
3.0000 ug/kg/min | INTRAVENOUS | Status: DC
  Administered 2019-05-24: 5 ug/kg/min via INTRAVENOUS
  Administered 2019-05-25: 3 ug/kg/min via INTRAVENOUS
  Filled 2019-05-24: qty 250

## 2019-05-24 MED ORDER — AMIODARONE LOAD VIA INFUSION
150.0000 mg | Freq: Once | INTRAVENOUS | Status: AC
  Administered 2019-05-24: 150 mg via INTRAVENOUS
  Filled 2019-05-24: qty 83.34

## 2019-05-24 MED ORDER — AMIODARONE HCL IN DEXTROSE 360-4.14 MG/200ML-% IV SOLN
60.0000 mg/h | INTRAVENOUS | Status: AC
  Administered 2019-05-24 – 2019-05-25 (×2): 60 mg/h via INTRAVENOUS
  Filled 2019-05-24 (×3): qty 200

## 2019-05-24 MED ORDER — INSULIN DETEMIR 100 UNIT/ML ~~LOC~~ SOLN
10.0000 [IU] | Freq: Two times a day (BID) | SUBCUTANEOUS | Status: DC
  Administered 2019-05-24: 10 [IU] via SUBCUTANEOUS
  Filled 2019-05-24 (×3): qty 0.1

## 2019-05-24 MED ORDER — MILRINONE LACTATE IN DEXTROSE 20-5 MG/100ML-% IV SOLN
0.1250 ug/kg/min | INTRAVENOUS | Status: DC
  Administered 2019-05-24: 0.125 ug/kg/min via INTRAVENOUS
  Filled 2019-05-24: qty 100

## 2019-05-24 MED FILL — Thrombin (Recombinant) For Soln 20000 Unit: CUTANEOUS | Qty: 1 | Status: AC

## 2019-05-24 NOTE — Progress Notes (Signed)
Patient ID: Max Rivas, male   DOB: [birth year], 69 y.o.   MRN: [id number] EVENING ROUNDS NOTE :     [address]       [place] [address]             [phone]                 1 Day Post-Op Procedure(s) (LRB): CORONARY ARTERY BYPASS GRAFTING (CABG), times three, using right greater saphenous vein and left internal mammary (N/A) CLIPPING OF ATRIAL APPENDAGE with atriclip (N/A) TRANSESOPHAGEAL ECHOCARDIOGRAM (TEE) (N/A)  Total Length of Stay:  LOS: 3 days  BP (!) 101/53   Pulse (!) 109   Temp 99.9 F (37.7 C)   Resp (!) 21   Ht 5\' 11"  (1.803 m)   Wt 107.3 kg   SpO2 (!) 88%   BMI 32.99 kg/m   .Intake/Output      05/11 0701 - 05/12 0700 05/12 0701 - 05/13 0700   P.O.     I.V. (mL/kg) 3687.9 (34.4) 887.7 (8.3)   Blood 968    IV Piggyback 1407.4 100   Total Intake(mL/kg) 6063.4 (56.5) 987.7 (9.2)   Urine (mL/kg/hr) 3915 (1.5) 1230 (1.1)   Emesis/NG output 0    Blood 1000    Chest Tube 380 590   Total Output 5295 1820   Net +768.4 -832.3          . sodium chloride 20 mL/hr at 05/24/19 1700  . sodium chloride Stopped (05/24/19 0507)  . sodium chloride 10 mL/hr at 05/23/19 1310  . cefUROXime (ZINACEF)  IV Stopped (05/24/19 0959)  . dexmedetomidine (PRECEDEX) IV infusion Stopped (05/24/19 0713)  . DOPamine 5 mcg/kg/min (05/24/19 1400)  . insulin 1.5 Units/hr (05/23/19 1326)  . lactated ringers    . lactated ringers 20 mL/hr at 05/24/19 1400  . milrinone 0.125 mcg/kg/min (05/24/19 1400)  . norepinephrine (LEVOPHED) Adult infusion 10 mcg/min (05/24/19 1700)     Lab Results  Component Value Date   WBC 19.8 (H) 05/24/2019   HGB 14.9 05/24/2019   HCT 43.6 05/24/2019   PLT 122 (L) 05/24/2019   GLUCOSE 159 (H) 05/24/2019   CHOL 126 05/13/2019   TRIG 101 05/13/2019   HDL 36 (L) 05/13/2019   LDLCALC 70 05/13/2019   ALT 14 05/13/2019   AST 19 05/13/2019   NA 137 05/24/2019   K 4.2 05/24/2019   CL 103 05/24/2019   CREATININE 1.28 (H) 05/24/2019    BUN 11 05/24/2019   CO2 28 05/24/2019   TSH 1.184 05/13/2019   INR 1.3 (H) 05/23/2019   HGBA1C 6.3 (H) 05/23/2019   Frequent pac- working toward a fib Still on pressor Extubated and reasonable respiratory status    07/23/2019 MD  Beeper [phone] Office [phone] 05/24/2019 5:14 PM

## 2019-05-24 NOTE — Discharge Summary (Signed)
Physician Discharge Summary       [address]       [place] [address]             [phone]    Patient ID: Max Rivas MRN: [id number] DOB/AGE: [birth year] 67 y.o.  Admit date: 05/21/2019 Discharge date: 06/01/2019  Admission Diagnoses: 1. Unstable angina (HCC) 2. Coronary artery disease (history of with previous MIs, stents)  Discharge Diagnoses:  1. S/p CABG x 3 and LA clip 2. Atrial fibrillation 3. History of COPD (chronic obstructive pulmonary disease) (HCC) 4. H. Influenza on sputum culture 5. History of VT (ventricular tachycardia) (HCC) 6. History of CHF (HCC) 7. History of hypercholesterolemia 8. History of GERD (gastroesophageal reflux disease) 9. History of OSA (has CPAP but unable to tolerate) 10. History of PE (HCC)  Procedure (s):  1. Median Sternotomy 2. Extracorporeal circulation 3.   Coronary artery bypass grafting x 3   Left internal mammary artery graft to the LAD  SVG to diagonal  SVG to OM  4.   Endoscopic vein harvest from the right leg 5.   Clipping of left atrial appendage by Dr. Laneta Simmers on 05/23/2019.  History of Presenting Illness: Max Rivas is a 69 year old male with a history of coronary artery disease status post PCI in 2004, congestive heart failure with an ICD placement and COPD.  He presents with multiple recurrent shocks as well as chest pain.  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  CVTS was consulted to discuss plans for surgical revascularization. 69 year old male the presents with in-stent restenosis in the circumflex vessel.  He also has LAD and diagonal disease.  In review of his left heart cath he does have a decent targets because when his lateral wall past the stent.  His LAD and diagonal branch is also good vessels.  While the risks and benefits of surgical  revascularization were discussed, the patient stated that this would be his least preferred and last option. He was going to seek a second opinion. He was started on Amiodarone and Ranexa 500 mg bid by cardiology and ultimately discharged. He was re admitted on 05/09 as he had further chest pain. After further discussion, patient opted to have coronary artery bypass grafting surgery. Pre operative carotid duplex US showed no significant internal carotid artery stenosis bilaterally. He underwent a CABG x 3 and LA clip on 05/23/2019 by Dr. Laneta Simmers, as Dr. Cliffton Asters was unavailable secondary to an emergency.   Brief Hospital Course:  The patient was extubated the morning of post op day one without difficulty. He remained afebrile and hemodynamically stable. [name], a line, chest tubes, and foley were removed early in the post operative course. He was weaned off Nor epineprhine, Milrinone and Dopamine drips. He went into a fib with RVR (had a history pre op and is s/p LA clip) so he was started on Amiodarone drip. Digoxin was added on 05/13 for better rate control. Apixaban will be restarted at discharge. He was volume over loaded and diuresed. He had ABL anemia. He did not require a post op transfusion. Last H and H was 11 and 32.6. He also had thrombocytopenia, which resolved.  His last platelet count was 173,000 . He was weaned off the insulin drip.  Once he was tolerating a diet, home diabetic medicines were restarted.  The patient's glucose remained well controlled.The  patient's HGA1C pre op was  6.3.  He was started on empiric Cefepime. Gram stan showed moderate gram negative coccobacilli and rare gram positive cocci. The patient was felt surgically stable for transfer from the ICU to [facility] for further convalescence on 05/16. Sputum culture showed H. Influenza (beta lactamase negative).  As discussed with pharmacy, will stop Cefepime and start Rocephin.  This was continued during his hospitalization.  He was  transitioned to Augmentin at discharge for an additional 7 days of treatment.  He continues to progress with cardiac rehab. He continues to require several liters of oxygen via [place] (4-5). We attempted to wean, however he will require oxygen at 3 L [place] at discharge.  He had complaints of what sounds like neuropathy so he was started on low dose Neurontin. He has been tolerating a diet and has had a bowel movement.  Epicardial pacing wires were removed on 05/17. Chest tube sutures will be removed in the office after discharge. The patient is felt surgically stable for discharge today.  Latest Vital Signs: Blood pressure 129/61, pulse 84, temperature 97.9 F (36.6 C), temperature source Oral, resp. rate 19, height  (1.803 m), weight 107 kg, SpO2 (!) 89 %.  Physical Exam:  General appearance: alert, cooperative and no distress Heart: irregularly irregular rhythm Lungs: wheezes bilaterally Abdomen: soft, non-tender; bowel sounds normal; no masses,  no organomegaly Extremities: edema trace Wound: clean and dry  Discharge Condition: Stable and discharged to home  Recent laboratory studies:  Lab Results  Component Value Date   WBC 8.9 05/29/2019   HGB 11.0 (L) 05/29/2019   HCT 32.6 (L) 05/29/2019   MCV 93.4 05/29/2019   PLT 173 05/29/2019   Lab Results  Component Value Date   NA 135 05/31/2019   K 3.7 05/31/2019   CL 92 (L) 05/31/2019   CO2 34 (H) 05/31/2019   CREATININE 1.22 05/31/2019   GLUCOSE 111 (H) 05/31/2019      Diagnostic Studies: DG Chest 2 View  Result Date: 05/28/2019 CLINICAL DATA:  Congestive heart failure EXAM: CHEST - 2 VIEW COMPARISON:  Chest x-rays dated 05/26/2019 and 05/25/2019. FINDINGS: Stable cardiomegaly. LEFT chest wall pacemaker/ICD apparatus in place. Stable bibasilar atelectasis and/or small bilateral pleural effusions. No pneumothorax is seen. IMPRESSION: Stable chest x-ray. Stable cardiomegaly. Stable bibasilar atelectasis and/or small bilateral  pleural effusions. Electronically Signed   By: Bary Richard M.D.   On: 05/28/2019 07:13   DG Chest 2 View  Result Date: 05/11/2019 CLINICAL DATA:  Chest pain since yesterday. EXAM: CHEST - 2 VIEW COMPARISON:  05/10/2018, 03/12/2019 and 06/21/2018 as well as chest CT 07/25/2018 FINDINGS: Left-sided pacemaker unchanged. Lungs are adequately inflated demonstrate chronic changes over the right mid to lower lung with pleural calcification present. No acute airspace process or effusion. Left lung is clear. Mild stable cardiomegaly. Remainder of the exam is unchanged. IMPRESSION: 1.  No acute findings. 2. Chronic stable findings over the right mid to lower lung including significant pleural calcification. Electronically Signed   By: Elberta Fortis M.D.   On: 05/11/2019 11:25   DG Chest 2 View  Result Date: 05/10/2019 CLINICAL DATA:  Chest pain and shortness of breath EXAM: CHEST - 2 VIEW COMPARISON:  March 12, 2019 FINDINGS: There is underlying emphysematous change, better delineated on prior CT examination. There is right base pleural thickening. Calcification in this area is noted but better seen on CT, indicative of previous asbestos exposure. There is also calcification along the anterior pleura, similar to  prior CT. There is scarring in the right base region. There is slight bibasilar atelectasis as well. No airspace consolidation. Heart size is normal. Pulmonary vascularity reflects underlying emphysematous change and is stable. Specifically, there is decreased vascularity to the upper lobes in areas of apparent bullous disease. Pacemaker leads are attached to the right atrium and right ventricle. No demonstrable adenopathy. No bone lesions. IMPRESSION: Underlying emphysematous change. Areas of pleural thickening and scarring with calcification, indicative of prior asbestos exposure. Areas of scarring and atelectatic change. No edema or consolidation. Heart size within normal limits. Pacemaker leads  attached to right atrium and right ventricle. No adenopathy appreciable. Appearance similar to prior studies. Electronically Signed   By: Bretta Bang III M.D.   On: 05/10/2019 13:55   CARDIAC CATHETERIZATION  Result Date: 05/12/2019  Recurrent, probably ischemically mediated ventricular tachycardia with multiple shocks.  High-grade native and diffuse in-stent restenosis ostial to mid circumflex 95 to 99%.  There is also a 90% stenosis beyond the stented segment.  Widely patent left main  Eccentric bulky and calcified 50 to 65% proximal to mid LAD beyond the origin of the large first diagonal.  Eccentric bulky and calcified ostial to proximal 70% first diagonal.  Both LAD and diagonal are mildly hemodynamically significant based upon RFR values 0.89 and 0.87, respectively.  RFR 0.89 or less is hemodynamically significant.  The right coronary particularly in the mid segment contains diffuse atherosclerosis but no focal high-grade obstruction.  Left ventriculography is not performed because of renal insufficiency.  LVEDP is normal measuring 10 mmHg. RECOMMENDATIONS:  We need to determine if the patient has any surgical options.  The likelihood of durable circumflex result is low due to the diffuse nature of disease and the need to place stents over a long segment, most of which would be stent sandwich.  The LAD and diagonal are mildly hemodynamically significant and could be grafted.  If PCI, will need orbital atherectomy over a long segment then reexpansion of the in-stent restenosis and if resistant, possibly shockwave PCI followed by extensive ostial to mid vessel restenting.  DG CHEST PORT 1 VIEW  Result Date: 05/26/2019 CLINICAL DATA:  Status post CABG. EXAM: PORTABLE CHEST 1 VIEW COMPARISON:  Chest x-ray from yesterday. FINDINGS: Interval removal of the mediastinal drain and left chest tube. Unchanged right internal jugular sheath and left chest wall pacemaker. Stable cardiomediastinal  silhouette status post CABG and left atrial appendage clipping. Normal pulmonary vascularity. Unchanged bibasilar atelectasis. No pneumothorax or large pleural effusion. No acute osseous abnormality. IMPRESSION: 1. Unchanged bibasilar atelectasis. 2. No pneumothorax status post left chest tube removal. Electronically Signed   By: Obie Dredge M.D.   On: 05/26/2019 07:25   DG CHEST PORT 1 VIEW  Result Date: 05/25/2019 CLINICAL DATA:  Status post CABG. EXAM: PORTABLE CHEST 1 VIEW COMPARISON:  Chest x-ray from yesterday. FINDINGS: The left costophrenic angle is excluded from the field of view. Interval removal of the endotracheal tube and Swan-Ganz catheter. Right internal jugular sheath remains in place. Unchanged mediastinal drain and left chest tube. Unchanged left chest wall pacemaker. Stable cardiomediastinal silhouette status post CABG and left atrial appendage clipping. Normal pulmonary vascularity. Continued improvement in aeration at the right lung base. Unchanged left basilar atelectasis. No pneumothorax or large pleural effusion. No acute osseous abnormality. IMPRESSION: 1. Continued improvement in aeration at the right lung base. Unchanged left basilar atelectasis. Electronically Signed   By: Obie Dredge M.D.   On: 05/25/2019 08:16   DG Chest  Port 1 View  Result Date: 05/24/2019 CLINICAL DATA:  Status post CABG. EXAM: PORTABLE CHEST 1 VIEW COMPARISON:  Chest x-ray from yesterday. FINDINGS: Unchanged endotracheal tube, right internal jugular Swan-Ganz catheter, mediastinal drain, and left chest tube. Unchanged left chest wall pacemaker. Stable cardiomediastinal silhouette status post CABG. Decreased small right pleural effusion with mildly improved aeration at the right lung base. Increasing atelectasis at the left lung base with trace left pleural effusion. No pneumothorax. No acute osseous abnormality. IMPRESSION: 1. Decreased small right pleural effusion with improving aeration at the right  lung base. 2. Increasing atelectasis at the left lung base with trace left pleural effusion. Electronically Signed   By: Titus Dubin M.D.   On: 05/24/2019 07:02   DG Chest Port 1 View  Result Date: 05/23/2019 CLINICAL DATA:  Post CABG EXAM: PORTABLE CHEST 1 VIEW COMPARISON:  05/21/2019 FINDINGS: Changes of CABG. Endotracheal tube is 7 cm above the carina. Right central line tip in the SVC. Left AICD is unchanged. No pneumothorax. Cardiomegaly with vascular congestion. Increasing right effusion and right base atelectasis. IMPRESSION: Postoperative changes.  No pneumothorax. Increasing right effusion with right base atelectasis. Electronically Signed   By: Rolm Baptise M.D.   On: 05/23/2019 14:13   DG Chest Portable 1 View  Result Date: 05/21/2019 CLINICAL DATA:  Chest pain EXAM: PORTABLE CHEST 1 VIEW COMPARISON:  05/11/2018 FINDINGS: LEFT-sided pacemaker overlies stable enlarged cardiac silhouette. There is bibasilar atelectasis and basilar pleural thickening (with calcifications) similar to comparison exam. No overt pulmonary edema or infiltrate. No pneumothorax. IMPRESSION: 1. No clear acute cardiopulmonary findings. 2. Bibasilar atelectasis and pleural thickening. Electronically Signed   By: Suzy Bouchard M.D.   On: 05/21/2019 05:54   ECHOCARDIOGRAM COMPLETE  Result Date: 05/13/2019    ECHOCARDIOGRAM REPORT   Patient Name:   JAMMIE CLINK Date of Exam: 05/13/2019 Medical Rec #:  [id number]      Height:       71.0 in Accession #:    [id number]     Weight:       237.2 lb Date of Birth:  [birth year]      BSA:          2.267 m Patient Age:    61 years       BP:           100/63 mmHg Patient Gender: M              HR:           59 bpm. Exam Location:  Inpatient Procedure: 2D Echo Indications:    CAD (coronary artery disease)                 Pre-op evaluation  History:        Patient has prior history of Echocardiogram examinations, most                 recent 06/22/2018. CHF, ICD (implantable                  cardioverter-defibrillator) in place, Pulmonary HTN,                 Arrythmias:VT (ventricular tachycardia) and Atrial Fibrillation,                 Signs/Symptoms:Chest Pain; Risk Factors:Tobacco abuse.  Sonographer:    Vikki Ports Turrentine Referring Phys: [id number] [place]  1. Akinesis of the inferior and inferolateral walls with overall mild to moderate LV dysfunction;  grade 2 diastolic dysfunction; mild MR; mild LAE.  2. Left ventricular ejection fraction, by estimation, is 40 to 45%. The left ventricle has mildly decreased function. The left ventricle demonstrates regional wall motion abnormalities (see scoring diagram/findings for description). The left ventricular  internal cavity size was moderately dilated. Left ventricular diastolic parameters are consistent with Grade II diastolic dysfunction (pseudonormalization).  3. Right ventricular systolic function is normal. The right ventricular size is normal.  4. Left atrial size was mildly dilated.  5. The mitral valve is normal in structure. Mild mitral valve regurgitation. No evidence of mitral stenosis.  6. The aortic valve has an indeterminant number of cusps. Aortic valve regurgitation is not visualized. No aortic stenosis is present.  7. The inferior vena cava is normal in size with greater than 50% respiratory variability, suggesting right atrial pressure of 3 mmHg. FINDINGS  Left Ventricle: Left ventricular ejection fraction, by estimation, is 40 to 45%. The left ventricle has mildly decreased function. The left ventricle demonstrates regional wall motion abnormalities. The left ventricular internal cavity size was moderately dilated. There is no left ventricular hypertrophy. Left ventricular diastolic parameters are consistent with Grade II diastolic dysfunction (pseudonormalization). Right Ventricle: The right ventricular size is normal. Right ventricular systolic function is normal. Left Atrium: Left atrial size was mildly  dilated. Right Atrium: Right atrial size was normal in size. Pericardium: There is no evidence of pericardial effusion. Mitral Valve: The mitral valve is normal in structure. Normal mobility of the mitral valve leaflets. Mild mitral valve regurgitation. No evidence of mitral valve stenosis. Tricuspid Valve: The tricuspid valve is normal in structure. Tricuspid valve regurgitation is not demonstrated. No evidence of tricuspid stenosis. Aortic Valve: The aortic valve has an indeterminant number of cusps. Aortic valve regurgitation is not visualized. No aortic stenosis is present. Aortic valve mean gradient measures 5.0 mmHg. Aortic valve peak gradient measures 9.0 mmHg. Aortic valve area, by VTI measures 1.75 cm. Pulmonic Valve: The pulmonic valve was grossly normal. Pulmonic valve regurgitation is not visualized. No evidence of pulmonic stenosis. Aorta: The aortic root is normal in size and structure. Venous: The inferior vena cava is normal in size with greater than 50% respiratory variability, suggesting right atrial pressure of 3 mmHg.  Additional Comments: Akinesis of the inferior and inferolateral walls with overall mild to moderate LV dysfunction; grade 2 diastolic dysfunction; mild MR; mild LAE. A pacer wire is visualized.  LEFT VENTRICLE PLAX 2D LVIDd:         6.30 cm  Diastology LVIDs:         5.20 cm  LV e' lateral:   10.90 cm/s LV PW:         1.10 cm  LV E/e' lateral: 9.5 LV IVS:        1.10 cm  LV e' medial:    7.51 cm/s LVOT diam:     2.00 cm  LV E/e' medial:  13.8 LV SV:         59 LV SV Index:   26 LVOT Area:     3.14 cm  RIGHT VENTRICLE RV S prime:     12.20 cm/s TAPSE (M-mode): 2.4 cm LEFT ATRIUM             Index       RIGHT ATRIUM           Index LA diam:        5.40 cm 2.38 cm/m  RA Area:     24.30 cm LA  Vol [facility]):   97.7 ml 43.09 ml/m RA Volume:   79.10 ml  34.89 ml/m LA Vol (A4C):   79.5 ml 35.07 ml/m LA Biplane Vol: 88.6 ml 39.08 ml/m  AORTIC VALVE AV Area (Vmax):    1.68 cm AV Area  (Vmean):   1.70 cm AV Area (VTI):     1.75 cm AV Vmax:           150.00 cm/s AV Vmean:          105.000 cm/s AV VTI:            0.336 m AV Peak Grad:      9.0 mmHg AV Mean Grad:      5.0 mmHg LVOT Vmax:         80.30 cm/s LVOT Vmean:        56.900 cm/s LVOT VTI:          0.187 m LVOT/AV VTI ratio: 0.56  AORTA Ao Root diam: 3.30 cm MITRAL VALVE MV Area (PHT): 3.48 cm     SHUNTS MV Decel Time: 218 msec     Systemic VTI:  0.19 m MR Peak grad: 100.0 mmHg    Systemic Diam: 2.00 cm MR Mean grad: 74.0 mmHg MR Vmax:      500.00 cm/s MR Vmean:     419.0 cm/s MV E velocity: 104.00 cm/s MV A velocity: 87.40 cm/s MV E/A ratio:  1.19 Olga Millers MD Electronically signed by Olga Millers MD Signature Date/Time: 05/13/2019/1:59:20 PM    Final    ECHO INTRAOPERATIVE TEE  Result Date: 05/23/2019  *INTRAOPERATIVE TRANSESOPHAGEAL REPORT *  Patient Name:   LAMONDRE WESCHE Date of Exam: 05/23/2019 Medical Rec #:  [id number]      Height:       71.0 in Accession #:    [id number]     Weight:       232.3 lb Date of Birth:  01-08-1951      BSA:          2.25 m Patient Age:    69 years       BP:           117/61 mmHg Patient Gender: M              HR:           56 bpm. Exam Location:  Anesthesiology Transesophogeal exam was perform intraoperatively during surgical procedure. Patient was closely monitored under general anesthesia during the entirety of examination. Indications:     Coronary artery disease Sonographer:     Renella Cunas RDCS Performing Phys: [id number] Eliezer Lofts LIGHTFOOT Diagnosing Phys: Lewie Loron MD Complications: No known complications during this procedure. POST-OP IMPRESSIONS - Right Ventricle: The right ventricle appears unchanged from pre-bypass. - Aorta: The aorta appears unchanged from pre-bypass. - Left Atrium: The left atrium appears unchanged from pre-bypass. - Left Atrial Appendage: The left atrial appendage appears unchanged from pre-bypass. - Aortic Valve: The aortic valve appears unchanged from pre-bypass.  - Mitral Valve: The mitral valve appears unchanged from pre-bypass. - Tricuspid Valve: The tricuspid valve appears unchanged from pre-bypass. - Interatrial Septum: The interatrial septum appears unchanged from pre-bypass. - Interventricular Septum: The interventricular septum appears unchanged from pre-bypass. - Pericardium: The pericardium appears unchanged from pre-bypass. PRE-OP FINDINGS  Left Ventricle: The left ventricle has moderate-severely reduced systolic function, with an ejection fraction of 30-35%. The cavity size was moderately dilated. There is concentric left ventricular hypertrophy. Right Ventricle: The right ventricle has  normal systolic function. The cavity was normal. There is no increase in right ventricular wall thickness. Left Atrium: Left atrial size was normal in size. The left atrial appendage is well visualized and there is no evidence of thrombus present. Right Atrium: Right atrial size was normal in size. Small mobile thrombus in RA apparently on the device leads. Interatrial Septum: No atrial level shunt detected by color flow Doppler. Pericardium: There is no evidence of pericardial effusion. Mitral Valve: The mitral valve is normal in structure. No thickening of the mitral valve leaflet. No calcification of the mitral valve leaflet. Mitral valve regurgitation is severe by color flow Doppler. The MR jet is centrally-directed. There is no evidence of mitral valve vegetation. Tricuspid Valve: The tricuspid valve was normal in structure. Tricuspid valve regurgitation is mild by color flow Doppler. There is no evidence of tricuspid valve vegetation. Aortic Valve: The aortic valve is tricuspid Aortic valve regurgitation is trivial by color flow Doppler. There is no evidence of aortic valve stenosis. There is no evidence of a vegetation on the aortic valve. Pulmonic Valve: The pulmonic valve was normal in structure, with normal. Pulmonic valve regurgitation is trivial by color flow Doppler.  +-------------+--------++ AORTIC VALVE          +-------------+--------++ AV Mean Grad:2.0 mmHg +-------------+--------++  Lewie Loron MD Electronically signed by Lewie Loron MD Signature Date/Time: 05/23/2019/1:46:39 PM    Final    VAS US DOPPLER PRE CABG  Result Date: 05/22/2019 PREOPERATIVE VASCULAR EVALUATION  Indications:      Pre-CABG. Risk Factors:     Hyperlipidemia. Limitations:      Patient body habitus, poor tissue/ultrasound interface Comparison Study: No prior studies. Performing Technologist: Olen Cordial Rvt  Examination Guidelines: A complete evaluation includes B-mode imaging, spectral Doppler, color Doppler, and power Doppler as needed of all accessible portions of each vessel. Bilateral testing is considered an integral part of a complete examination. Limited examinations for reoccurring indications may be performed as noted.  Right Carotid Findings: +----------+--------+--------+--------+--------------------------+--------+           PSV cm/sEDV cm/sStenosisDescribe                  Comments +----------+--------+--------+--------+--------------------------+--------+ CCA Prox  65      8               smooth and heterogenous            +----------+--------+--------+--------+--------------------------+--------+ CCA Distal45      8               irregular and heterogenous         +----------+--------+--------+--------+--------------------------+--------+ ICA Prox  65      21              irregular and heterogenous         +----------+--------+--------+--------+--------------------------+--------+ ICA Distal60      18                                        tortuous +----------+--------+--------+--------+--------------------------+--------+ ECA       59      7                                                  +----------+--------+--------+--------+--------------------------+--------+ Portions of this  table do not appear on this page.  +----------+--------+-------+--------+------------+           PSV cm/sEDV cmsDescribeArm Pressure +----------+--------+-------+--------+------------+ Subclavian159                    124          +----------+--------+-------+--------+------------+ +---------+--------+--+--------+-+---------+ VertebralPSV cm/s36EDV cm/s6Antegrade +---------+--------+--+--------+-+---------+ Left Carotid Findings: +----------+--------+--------+--------+--------------------------+--------+           PSV cm/sEDV cm/sStenosisDescribe                  Comments +----------+--------+--------+--------+--------------------------+--------+ CCA Prox  58      10              irregular and heterogenous         +----------+--------+--------+--------+--------------------------+--------+ CCA Distal33      6               irregular and heterogenous         +----------+--------+--------+--------+--------------------------+--------+ ICA Prox  57      19              calcific                           +----------+--------+--------+--------+--------------------------+--------+ ICA Distal77      22                                        tortuous +----------+--------+--------+--------+--------------------------+--------+ ECA       70      7                                                  +----------+--------+--------+--------+--------------------------+--------+ +----------+--------+--------+--------+------------+ SubclavianPSV cm/sEDV cm/sDescribeArm Pressure +----------+--------+--------+--------+------------+           288                     118          +----------+--------+--------+--------+------------+ +---------+--------+--+--------+-+---------+ VertebralPSV cm/s29EDV cm/s7Antegrade +---------+--------+--+--------+-+---------+  ABI Findings: +--------+------------------+-----+----------+--------+ Right   Rt Pressure (mmHg)IndexWaveform  Comment   +--------+------------------+-----+----------+--------+ [id number]                    triphasic          +--------+------------------+-----+----------+--------+ PTA     79                0.64 monophasic         +--------+------------------+-----+----------+--------+ DP      73                0.59 monophasic         +--------+------------------+-----+----------+--------+ +--------+------------------+-----+----------+-------+ Left    Lt Pressure (mmHg)IndexWaveform  Comment +--------+------------------+-----+----------+-------+ [id number]                    triphasic         +--------+------------------+-----+----------+-------+ PTA     71                0.57 monophasic        +--------+------------------+-----+----------+-------+ DP      69                0.56 monophasic        +--------+------------------+-----+----------+-------+ +-------+---------------+----------------+ ABI/TBIToday's ABI/TBIPrevious  ABI/TBI +-------+---------------+----------------+ Right  0.64                            +-------+---------------+----------------+ Left   0.57                            +-------+---------------+----------------+  Right Doppler Findings: +--------+--------+-----+---------+--------+ Site    PressureIndexDoppler  Comments +--------+--------+-----+---------+--------+ [id number]          triphasic         +--------+--------+-----+---------+--------+ Radial               triphasic         +--------+--------+-----+---------+--------+ Ulnar                triphasic         +--------+--------+-----+---------+--------+  Left Doppler Findings: +--------+--------+-----+---------+--------+ Site    PressureIndexDoppler  Comments +--------+--------+-----+---------+--------+ [id number]          triphasic         +--------+--------+-----+---------+--------+ Radial               triphasic          +--------+--------+-----+---------+--------+ Ulnar                triphasic         +--------+--------+-----+---------+--------+  Summary: Right Carotid: Velocities in the right ICA are consistent with a 1-39% stenosis. Left Carotid: Velocities in the left ICA are consistent with a 1-39% stenosis. Vertebrals: Bilateral vertebral arteries demonstrate antegrade flow. Right ABI: Resting right ankle-brachial index indicates moderate right lower extremity arterial disease. Left ABI: Resting left ankle-brachial index indicates moderate left lower extremity arterial disease. Right Upper Extremity: Doppler waveform obliterate with right radial compression. Doppler waveform obliterate with right ulnar compression. Left Upper Extremity: Doppler waveform obliterate with left radial compression. Doppler waveform obliterate with left ulnar compression.  Electronically signed by Fabienne Bruns MD on 05/22/2019 at 6:23:16 PM.    Final          Discharge Medications: Allergies as of 06/01/2019      Reactions   Crestor [rosuvastatin Calcium] Other (See Comments)   Back spasms, but can tolerate it at a low dose now      Medication List    STOP taking these medications   furosemide 40 MG tablet Commonly known as: Lasix   metoprolol succinate 50 MG 24 hr tablet Commonly known as: TOPROL-XL   ranolazine 500 MG 12 hr tablet Commonly known as: RANEXA     TAKE these medications   acetaminophen 325 MG tablet Commonly known as: TYLENOL Take 2 tablets (650 mg total) by mouth every 6 (six) hours as needed for mild pain (or Fever >/= 101).   amiodarone 200 MG tablet Commonly known as: PACERONE Take 1 tablet (200 mg total) by mouth 2 (two) times daily after a meal. X 2 days, then decrease to 200 mg daily What changed:   medication strength  See the new instructions.   amoxicillin-clavulanate 875-125 MG tablet Commonly known as: Augmentin Take 1 tablet by mouth 2 (two) times daily.   aspirin 81 MG  chewable tablet Chew 1 tablet (81 mg total) by mouth daily.   atorvastatin 80 MG tablet Commonly known as: LIPITOR Take 1 tablet (80 mg total) by mouth daily.   carboxymethylcellulose 0.5 % Soln Commonly known as: REFRESH PLUS Place 1 drop into both eyes at bedtime.   dextromethorphan-guaiFENesin 30-600 MG 12hr tablet  Commonly known as: MUCINEX DM Take 1 tablet by mouth 2 (two) times daily as needed for cough.   digoxin 0.125 MG tablet Commonly known as: LANOXIN Take 1 tablet (0.125 mg total) by mouth daily.   Eliquis 5 MG Tabs tablet Generic drug: apixaban Take 5 mg by mouth 2 (two) times daily.   gabapentin 100 MG capsule Commonly known as: NEURONTIN Take 2 capsules (200 mg total) by mouth 2 (two) times daily.   levothyroxine 75 MCG tablet Commonly known as: SYNTHROID Take 75 mcg by mouth daily before breakfast.   midodrine 5 MG tablet Commonly known as: PROAMATINE Take 1 tablet (5 mg total) by mouth 3 (three) times daily with meals.   mometasone 220 MCG/INH inhaler Commonly known as: ASMANEX Inhale 2 puffs into the lungs 2 (two) times daily.   montelukast 10 MG tablet Commonly known as: SINGULAIR Take 10 mg by mouth 2 (two) times daily.   multivitamin with minerals Tabs tablet Take 1 tablet by mouth daily.   nitroGLYCERIN 0.4 MG SL tablet Commonly known as: NITROSTAT Place 0.4 mg under the tongue every 5 (five) minutes as needed for chest pain.   omega-3 acid ethyl esters 1 g capsule Commonly known as: LOVAZA Take 1 capsule (1 g total) by mouth 2 (two) times daily.   omeprazole 20 MG capsule Commonly known as: PRILOSEC Take 20 mg by mouth 2 (two) times daily before a meal.   Stiolto Respimat 2.5-2.5 MCG/ACT Aers Generic drug: Tiotropium Bromide-Olodaterol Inhale 2 puffs into the lungs daily.   Systane Balance 0.6 % Soln Generic drug: Propylene Glycol Place 1 drop into both eyes See admin instructions. Place 1 drop into each eye four times a day and  apply a warm compress for 10 minutes afterwards   traMADol 50 MG tablet Commonly known as: ULTRAM Take 1 tablet (50 mg total) by mouth every 4 (four) hours as needed for moderate pain.   Vitamin D3 50 MCG (2000 UT) Tabs Take 2,000 Units by mouth daily.   Xopenex HFA 45 MCG/ACT inhaler Generic drug: levalbuterol Inhale 2 puffs into the lungs every 6 (six) hours as needed for wheezing or shortness of breath.            Durable Medical Equipment  (From admission, onward)         Start     Ordered   05/31/19 0816  For home use only DME 4 wheeled rolling walker with seat  Once    Question:  Patient needs a walker to treat with the following condition  Answer:  Physical deconditioning   05/31/19 0816   05/31/19 0815  For home use only DME oxygen  Once    Question Answer Comment  Length of Need 6 Months   Mode or (Route) Nasal cannula   Liters per Minute 3   Frequency Continuous (stationary and portable oxygen unit needed)   Oxygen conserving device No   Oxygen delivery system Gas      05/31/19 0816         The patient has been discharged on:   1.Beta Blocker:  Yes [   ]                              No   [ X  ]  If No, reason: labile BP  2.Ace Inhibitor/ARB: Yes [   ]                                     No  [ X   ]                                     If No, reason:labile BP  3.Statin:   Yes [ X  ]                  No  [   ]                  If No, reason:  4.Ecasa:  Yes  Arly.Keller ]                  No   [   ]                  If No, reason:  Follow Up Appointments: Follow-up Information    [name], MD. Go on 06/28/2019.   Specialty: Cardiothoracic Surgery Why: PA/LAT CXR to be taken (at [facility] which is in the same building as Dr. Sharee Pimple office)on 06/16 at 3:30 pm;Appointment time is at 4:00 pm Contact information: [address] [place] [address] [phone]        Lyn Records, MD Follow  up.   Specialty: Cardiology Contact information: [address] [place] [address] [phone]        [facility]. Go on 06/06/2019.   Specialty: Cardiothoracic Surgery Why: Appointment is with nurse only for chest tube suture removal. Appointment time is at 11:30 am Contact information: [address] [place], [address] [place] [address] [phone]       [facility] Follow up.   Why: rollator arranged- to be delivered to room prior to discharge, home 02 referral Contact information: [address] [facility] [place] [address] [phone]        [facility] Follow up.   Specialty: Home Health Services Why: [facility] Contact information: [address] [place] [address] [phone]           Signed: Carl Best 06/01/2019, 7:20 AM

## 2019-05-24 NOTE — Progress Notes (Signed)
  Amiodarone Drug - Drug Interaction Consult Note  Recommendations: Monitor for signs of myopathy.  Amiodarone is metabolized by the cytochrome P450 system and therefore has the potential to cause many drug interactions. Amiodarone has an average plasma half-life of 50 days (range 20 to 100 days).   There is potential for drug interactions to occur several weeks or months after stopping treatment and the onset of drug interactions may be slow after initiating amiodarone.   [x]  Statins: Increased risk of myopathy. Simvastatin- restrict dose to 20mg  daily. Other statins: counsel patients to report any muscle pain or weakness immediately.  []  Anticoagulants: Amiodarone can increase anticoagulant effect. Consider warfarin dose reduction. Patients should be monitored closely and the dose of anticoagulant altered accordingly, remembering that amiodarone levels take several weeks to stabilize.  []  Antiepileptics: Amiodarone can increase plasma concentration of phenytoin, the dose should be reduced. Note that small changes in phenytoin dose can result in large changes in levels. Monitor patient and counsel on signs of toxicity.  []  Beta blockers: increased risk of bradycardia, AV block and myocardial depression. Sotalol - avoid concomitant use.  []   Calcium channel blockers (diltiazem and verapamil): increased risk of bradycardia, AV block and myocardial depression.  []   Cyclosporine: Amiodarone increases levels of cyclosporine. Reduced dose of cyclosporine is recommended.  []  Digoxin dose should be halved when amiodarone is started.  []  Diuretics: increased risk of cardiotoxicity if hypokalemia occurs.  []  Oral hypoglycemic agents (glyburide, glipizide, glimepiride): increased risk of hypoglycemia. Patient's glucose levels should be monitored closely when initiating amiodarone therapy.   []  Drugs that prolong the QT interval:  Torsades de pointes risk may be increased with concurrent use - avoid  if possible.  Monitor QTc, also keep magnesium/potassium WNL if concurrent therapy can't be avoided. Antibiotics: e.g. fluoroquinolones, erythromycin. . Antiarrhythmics: e.g. quinidine, procainamide, disopyramide, sotalol. . Antipsychotics: e.g. phenothiazines, haloperidol.  . Lithium, tricyclic antidepressants, and methadone.  , PharmD, BCPS  05/24/2019 10:58 PM

## 2019-05-24 NOTE — Procedures (Signed)
Extubation Procedure Note  Patient Details:   Name: Max Rivas DOB: [birth year] MRN: [id number]   Airway Documentation:    Vent end date: 05/23/19 Vent end time: 1711   Evaluation  O2 sats: stable throughout Complications: No apparent complications Patient did tolerate procedure well. Bilateral Breath Sounds: Clear, Diminished   Pt extubated per rapid wean protocol to BIPAP per MD order. NIF -38, VC 1L. Pt had positive cuff leak and able to voice.  Guss Bunde 05/24/2019, 9:05 AM

## 2019-05-24 NOTE — Discharge Instructions (Signed)

## 2019-05-24 NOTE — Progress Notes (Signed)
1 Day Post-Op Procedure(s) (LRB): CORONARY ARTERY BYPASS GRAFTING (CABG), times three, using right greater saphenous vein and left internal mammary (N/A) CLIPPING OF ATRIAL APPENDAGE with atriclip (N/A) TRANSESOPHAGEAL ECHOCARDIOGRAM (TEE) (N/A) Subjective: Intubated on vent but awake and appropriate. Wants tube out.  Extubated late yesterday afternoon but developed hypoxemia and somnolence not responsive to bipap so he was reintubated.  Objective: Vital signs in last 24 hours: Temp:  [96.8 F (36 C)-100.2 F (37.9 C)] 100.2 F (37.9 C) (05/12 0600) Pulse Rate:  [38-110] 95 (05/12 0600) Cardiac Rhythm: Sinus tachycardia (05/12 0400) Resp:  [0-29] 18 (05/12 0600) BP: (94-167)/(52-126) 105/52 (05/12 0600) SpO2:  [87 %-100 %] 95 % (05/12 0600) Arterial Line BP: (83-155)/(44-72) 91/49 (05/12 0600) FiO2 (%):  [40 %-100 %] 50 % (05/12 0400) Weight:  [107.3 kg] 107.3 kg (05/12 0500)  Hemodynamic parameters for last 24 hours: PAP: (29-49)/(16-30) 33/21 CO:  [4.7 L/min-6.6 L/min] 6.2 L/min CI:  [2.1 L/min/m2-2.9 L/min/m2] 2.8 L/min/m2  Intake/Output from previous day: 05/11 0701 - 05/12 0700 In: 6063.4 [I.V.:3687.9; Blood:968; IV Piggyback:1407.4] Out: 5295 [TKWIO:9735; Blood:1000; Chest Tube:380] Intake/Output this shift: No intake/output data recorded.  General appearance: alert and cooperative Neurologic: intact Heart: regular rate and rhythm, S1, S2 normal, no murmur Lungs: clear to auscultation bilaterally Extremities: edema mild Wound: dressings dry  Lab Results: Recent Labs    05/23/19 1855 05/23/19 1855 05/24/19 0412 05/24/19 0431  WBC 19.5*  --  17.4*  --   HGB 15.9   < > 15.3 15.3  HCT 48.0   < > 45.9 45.0  PLT 152  --  133*  --    < > = values in this interval not displayed.   BMET:  Recent Labs    05/23/19 1855 05/23/19 1855 05/24/19 0412 05/24/19 0431  NA 136   < > 138 139  K 4.6   < > 4.3 4.2  CL 105  --  104  --   CO2 26  --  24  --   GLUCOSE  140*  --  119*  --   BUN 10  --  11  --   CREATININE 1.20  --  1.08  --   CALCIUM 7.8*  --  7.9*  --    < > = values in this interval not displayed.    PT/INR:  Recent Labs    05/23/19 1330  LABPROT 16.1*  INR 1.3*   ABG    Component Value Date/Time   PHART 7.359 05/24/2019 0431   HCO3 26.4 05/24/2019 0431   TCO2 28 05/24/2019 0431   ACIDBASEDEF 3.0 (H) 05/23/2019 1330   O2SAT 96.0 05/24/2019 0431   CBG (last 3)  Recent Labs    05/24/19 0203 05/24/19 0423 05/24/19 0614  GLUCAP 121* 129* 91   CXR: stable. Improved aeration of RLL compared to postop    Assessment/Plan: S/P Procedure(s) (LRB): CORONARY ARTERY BYPASS GRAFTING (CABG), times three, using right greater saphenous vein and left internal mammary (N/A) CLIPPING OF ATRIAL APPENDAGE with atriclip (N/A) TRANSESOPHAGEAL ECHOCARDIOGRAM (TEE) (N/A)  POD 1  Hemodynamically stable but requiring levophed on vent for BP support. CI 2.8 and Co-ox 73% on milrinone 0.25 and dop 5. Will decrease milrinone to 0.125.  Severe COPD and active smoking with baseline PCO2 of 46 and sat 93% on RA. Failed early extubation but will try again this am. May need bipap. If he fails again will have CCM see him. No preop PFT's done.  Continue chest tubes for now.  Diurese once off vasopressor.   LOS: 3 days    [name] 05/24/2019

## 2019-05-25 ENCOUNTER — Inpatient Hospital Stay (HOSPITAL_COMMUNITY): Payer: No Typology Code available for payment source

## 2019-05-25 DIAGNOSIS — I480 Paroxysmal atrial fibrillation: Secondary | ICD-10-CM

## 2019-05-25 LAB — CBC
HCT: 42.5 % (ref 39.0–52.0)
Hemoglobin: 14.3 g/dL (ref 13.0–17.0)
MCH: 31.2 pg (ref 26.0–34.0)
MCHC: 33.6 g/dL (ref 30.0–36.0)
MCV: 92.6 fL (ref 80.0–100.0)
Platelets: 119 10*3/uL — ABNORMAL LOW (ref 150–400)
RBC: 4.59 MIL/uL (ref 4.22–5.81)
RDW: 14.6 % (ref 11.5–15.5)
WBC: 18.5 10*3/uL — ABNORMAL HIGH (ref 4.0–10.5)
nRBC: 0 % (ref 0.0–0.2)

## 2019-05-25 LAB — GLUCOSE, CAPILLARY
Glucose-Capillary: 125 mg/dL — ABNORMAL HIGH (ref 70–99)
Glucose-Capillary: 143 mg/dL — ABNORMAL HIGH (ref 70–99)
Glucose-Capillary: 157 mg/dL — ABNORMAL HIGH (ref 70–99)
Glucose-Capillary: 175 mg/dL — ABNORMAL HIGH (ref 70–99)
Glucose-Capillary: 181 mg/dL — ABNORMAL HIGH (ref 70–99)
Glucose-Capillary: 183 mg/dL — ABNORMAL HIGH (ref 70–99)
Glucose-Capillary: 222 mg/dL — ABNORMAL HIGH (ref 70–99)

## 2019-05-25 LAB — BASIC METABOLIC PANEL
Anion gap: 10 (ref 5–15)
BUN: 10 mg/dL (ref 8–23)
CO2: 25 mmol/L (ref 22–32)
Calcium: 8 mg/dL — ABNORMAL LOW (ref 8.9–10.3)
Chloride: 101 mmol/L (ref 98–111)
Creatinine, Ser: 1.12 mg/dL (ref 0.61–1.24)
GFR calc Af Amer: >60 mL/min (ref >60)
GFR calc non Af Amer: >60 mL/min (ref >60)
Glucose, Bld: 190 mg/dL — ABNORMAL HIGH (ref 70–99)
Potassium: 3.9 mmol/L (ref 3.5–5.1)
Sodium: 136 mmol/L (ref 135–145)

## 2019-05-25 LAB — COOXEMETRY PANEL
Carboxyhemoglobin: 1.5 % (ref 0.5–1.5)
Methemoglobin: 1.4 % (ref 0.0–1.5)
O2 Saturation: 79 %
Total hemoglobin: 15 g/dL (ref 12.0–16.0)

## 2019-05-25 LAB — TSH: TSH: 0.321 u[IU]/mL — ABNORMAL LOW (ref 0.350–4.500)

## 2019-05-25 LAB — MAGNESIUM: Magnesium: 2.1 mg/dL (ref 1.7–2.4)

## 2019-05-25 MED ORDER — ENOXAPARIN SODIUM 40 MG/0.4ML ~~LOC~~ SOLN
40.0000 mg | Freq: Every day | SUBCUTANEOUS | Status: DC
  Administered 2019-05-25 – 2019-05-26 (×2): 40 mg via SUBCUTANEOUS
  Filled 2019-05-25 (×2): qty 0.4

## 2019-05-25 MED ORDER — AMIODARONE IV BOLUS ONLY 150 MG/100ML: 150.0000 mg | Freq: Once | INTRAVENOUS | Status: DC

## 2019-05-25 MED ORDER — AMIODARONE LOAD VIA INFUSION
150.0000 mg | Freq: Once | INTRAVENOUS | Status: AC
  Administered 2019-05-25: 150 mg via INTRAVENOUS
  Filled 2019-05-25: qty 83.34

## 2019-05-25 MED ORDER — POTASSIUM CHLORIDE CRYS ER 20 MEQ PO TBCR
20.0000 meq | EXTENDED_RELEASE_TABLET | Freq: Two times a day (BID) | ORAL | Status: AC
  Administered 2019-05-25 (×2): 20 meq via ORAL
  Filled 2019-05-25 (×2): qty 1

## 2019-05-25 MED ORDER — DIGOXIN 0.25 MG/ML IJ SOLN
0.2500 mg | Freq: Once | INTRAMUSCULAR | Status: AC
  Administered 2019-05-25: 0.25 mg via INTRAVENOUS
  Filled 2019-05-25: qty 2

## 2019-05-25 MED ORDER — DIGOXIN 0.25 MG/ML IJ SOLN
0.5000 mg | Freq: Once | INTRAMUSCULAR | Status: AC
  Administered 2019-05-25: 0.5 mg via INTRAVENOUS
  Filled 2019-05-25: qty 2

## 2019-05-25 MED ORDER — DIGOXIN 125 MCG PO TABS
0.1250 mg | ORAL_TABLET | Freq: Every day | ORAL | Status: DC
  Administered 2019-05-26 – 2019-06-01 (×7): 0.125 mg via ORAL
  Filled 2019-05-25 (×7): qty 1

## 2019-05-25 MED ORDER — INSULIN DETEMIR 100 UNIT/ML ~~LOC~~ SOLN
15.0000 [IU] | Freq: Two times a day (BID) | SUBCUTANEOUS | Status: DC
  Administered 2019-05-25 – 2019-05-28 (×8): 15 [IU] via SUBCUTANEOUS
  Filled 2019-05-25 (×11): qty 0.15

## 2019-05-25 MED FILL — Mannitol IV Soln 20%: INTRAVENOUS | Qty: 500 | Status: AC

## 2019-05-25 MED FILL — Lidocaine HCl Local Soln Prefilled Syringe 100 MG/5ML (2%): INTRAMUSCULAR | Qty: 5 | Status: AC

## 2019-05-25 MED FILL — Sodium Bicarbonate IV Soln 8.4%: INTRAVENOUS | Qty: 50 | Status: AC

## 2019-05-25 MED FILL — Heparin Sodium (Porcine) Inj 1000 Unit/ML: INTRAMUSCULAR | Qty: 30 | Status: AC

## 2019-05-25 MED FILL — Heparin Sodium (Porcine) Inj 1000 Unit/ML: INTRAMUSCULAR | Qty: 20 | Status: AC

## 2019-05-25 MED FILL — Electrolyte-R (PH 7.4) Solution: INTRAVENOUS | Qty: 4000 | Status: AC

## 2019-05-25 MED FILL — Potassium Chloride Inj 2 mEq/ML: INTRAVENOUS | Qty: 40 | Status: AC

## 2019-05-25 MED FILL — Lidocaine HCl Local Preservative Free (PF) Inj 2%: INTRAMUSCULAR | Qty: 15 | Status: AC

## 2019-05-25 MED FILL — Calcium Chloride Inj 10%: INTRAVENOUS | Qty: 10 | Status: AC

## 2019-05-25 MED FILL — Sodium Chloride IV Soln 0.9%: INTRAVENOUS | Qty: 2000 | Status: AC

## 2019-05-25 NOTE — Progress Notes (Signed)
CT surgery p.m. Rounds  Patient resting comfortably in sinus rhythm Urine output adequate on renal dose dopamine O2 sat 95% on 6 L high flow O2 Today's Vitals   05/25/19 1500 05/25/19 1600 05/25/19 1700 05/25/19 1800  BP: 114/62 (!) 112/51  128/61  Pulse: 96 91 (!) 124 88  Resp: (!) 26 (!) 25 (!) 21 (!) 22  Temp:      TempSrc:      SpO2: (!) 85% 92% (!) 85% 93%  Weight:      Height:      PainSc:  0-No pain 0-No pain    Body mass index is 33.76 kg/m.

## 2019-05-25 NOTE — Progress Notes (Signed)
2 Days Post-Op Procedure(s) (LRB): CORONARY ARTERY BYPASS GRAFTING (CABG), times three, using right greater saphenous vein and left internal mammary (N/A) CLIPPING OF ATRIAL APPENDAGE with atriclip (N/A) TRANSESOPHAGEAL ECHOCARDIOGRAM (TEE) (N/A) Subjective: Feels ok.   Went into atrial fib with RVR overnight and started on IV amio. Received two boluses and on drip with rate 110-120.  Objective: Vital signs in last 24 hours: Temp:  [97.8 F (36.6 C)-100.6 F (38.1 C)] 97.8 F (36.6 C) (05/13 0400) Pulse Rate:  [85-126] 102 (05/13 0615) Cardiac Rhythm: Atrial fibrillation (05/13 0400) Resp:  [17-30] 21 (05/13 0615) BP: (96-155)/(51-105) 122/64 (05/13 0600) SpO2:  [86 %-97 %] 91 % (05/13 0615) Arterial Line BP: (74-141)/(38-68) 82/45 (05/13 0615) FiO2 (%):  [40 %-50 %] 50 % (05/12 0859) Weight:  [109.8 kg] 109.8 kg (05/13 0500)  Hemodynamic parameters for last 24 hours: PAP: (31-42)/(12-25) 39/21  Intake/Output from previous day: 05/12 0701 - 05/13 0700 In: 2320.9 [I.V.:2120.9; IV Piggyback:200] Out: 2770 [Urine:2390; Chest Tube:380] Intake/Output this shift: No intake/output data recorded.  General appearance: alert and cooperative Neurologic: intact Heart: irregularly irregular rhythm and no murmur Lungs: clear to auscultation bilaterally Extremities: edema mild Wound: incisions ok  Lab Results: Recent Labs    05/24/19 1620 05/25/19 0330  WBC 19.8* 18.5*  HGB 14.9 14.3  HCT 43.6 42.5  PLT 122* 119*   BMET:  Recent Labs    05/24/19 1620 05/25/19 0330  NA 137 136  K 4.2 3.9  CL 103 101  CO2 28 25  GLUCOSE 159* 190*  BUN 11 10  CREATININE 1.28* 1.12  CALCIUM 8.2* 8.0*    PT/INR:  Recent Labs    05/23/19 1330  LABPROT 16.1*  INR 1.3*   ABG    Component Value Date/Time   PHART 7.347 (L) 05/24/2019 0957   HCO3 26.2 05/24/2019 0957   TCO2 28 05/24/2019 0957   ACIDBASEDEF 3.0 (H) 05/23/2019 1330   O2SAT 79.0 05/25/2019 0330   CBG (last 3)   Recent Labs    05/24/19 1957 05/25/19 0021 05/25/19 0400  GLUCAP 137* 181* 183*   CXR: stable aeration  Assessment/Plan: S/P Procedure(s) (LRB): CORONARY ARTERY BYPASS GRAFTING (CABG), times three, using right greater saphenous vein and left internal mammary (N/A) CLIPPING OF ATRIAL APPENDAGE with atriclip (N/A) TRANSESOPHAGEAL ECHOCARDIOGRAM (TEE) (N/A)  POD 2  Hemodynamically stable but still on NE 10 to support BP. Co-ox is 79 this am so will stop milrinone and decrease dop to 3. Midodrine started last pm. Wean NE as tolerated. Hold off on beta blocker.  Hx of PAF, s/p LAA clipping. Was on amio preop and IV amio started overnight. Will add digoxin for rate control. Continue IV amio today. Plan to resume Eliquis at discharge.  DC chest tubes today.  Smoking and severe COPD: continue nebs on schedule.  Volume excess: hold off on further diuresis for now with marginal BP.  DM: glucose 180's overnight. Will increase Levemir.   LOS: 4 days    [name] 05/25/2019

## 2019-05-25 NOTE — Anesthesia Postprocedure Evaluation (Signed)
Anesthesia Post Note  Patient: JOUSHUA DUGAR  Procedure(s) Performed: CORONARY ARTERY BYPASS GRAFTING (CABG), times three, using right greater saphenous vein and left internal mammary (N/A Chest) CLIPPING OF ATRIAL APPENDAGE with atriclip (N/A ) TRANSESOPHAGEAL ECHOCARDIOGRAM (TEE) (N/A )     Patient location during evaluation: SICU Anesthesia Type: General Level of consciousness: sedated and patient remains intubated per anesthesia plan Pain management: pain level controlled Vital Signs Assessment: post-procedure vital signs reviewed and stable Respiratory status: patient remains intubated per anesthesia plan and patient on ventilator - see flowsheet for VS Cardiovascular status: stable Anesthetic complications: no    Last Vitals:  Vitals:   05/25/19 0400 05/25/19 0415  BP: 139/74   Pulse: (!) 112 (!) 109  Resp: 20 18  Temp: 36.6 C   SpO2: 93% 92%    Last Pain:  Vitals:   05/25/19 0400  TempSrc: Oral  PainSc: 0-No pain                 Lewie Loron

## 2019-05-25 NOTE — Progress Notes (Signed)
Progress Note  Patient Name: Max Rivas Date of Encounter: 05/25/2019  Primary Cardiologist: Dr Katrinka Blazing  Subjective   C/O dyspnea and chest soreness  Inpatient Medications    Scheduled Meds: . sodium chloride   Intravenous Once  . acetaminophen  1,000 mg Oral Q6H   Or  . acetaminophen (TYLENOL) oral liquid 160 mg/5 mL  1,000 mg Per Tube Q6H  . aspirin EC  325 mg Oral Daily   Or  . aspirin  324 mg Per Tube Daily  . atorvastatin  80 mg Oral Daily  . bisacodyl  10 mg Oral Daily   Or  . bisacodyl  10 mg Rectal Daily  . budesonide  0.25 mg Inhalation BID  . Chlorhexidine Gluconate Cloth  6 each Topical Daily  . digoxin  0.25 mg Intravenous Once  . digoxin  0.5 mg Intravenous Once  . [START ON 05/26/2019] digoxin  0.125 mg Oral Daily  . docusate sodium  200 mg Oral Daily  . enoxaparin (LOVENOX) injection  40 mg Subcutaneous QHS  . insulin aspart  2-6 Units Subcutaneous Q4H  . insulin detemir  15 Units Subcutaneous Q12H  . levalbuterol  1.25 mg Nebulization Q6H  . levothyroxine  75 mcg Oral Q0600  . mouth rinse  15 mL Mouth Rinse BID  . midodrine  10 mg Oral TID WC  . montelukast  10 mg Oral BID  . multivitamin with minerals  1 tablet Oral Daily  . omega-3 acid ethyl esters  1 g Oral BID  . pantoprazole  40 mg Oral Daily  . polyvinyl alcohol  1 drop Both Eyes QHS  . potassium chloride  20 mEq Oral BID  . sodium chloride flush  10-40 mL Intracatheter Q12H  . sodium chloride flush  3 mL Intravenous Q12H  . umeclidinium-vilanterol  1 puff Inhalation Daily   Continuous Infusions: . sodium chloride Stopped (05/24/19 1736)  . sodium chloride Stopped (05/24/19 0507)  . sodium chloride 10 mL/hr at 05/23/19 1310  . amiodarone 30 mg/hr (05/25/19 0600)  . cefUROXime (ZINACEF)  IV Stopped (05/24/19 2053)  . DOPamine 3 mcg/kg/min (05/25/19 0659)  . lactated ringers    . lactated ringers Stopped (05/24/19 2023)  . norepinephrine (LEVOPHED) Adult infusion 11 mcg/min (05/25/19  0600)   PRN Meds: sodium chloride, ALPRAZolam, dextrose, morphine injection, ondansetron (ZOFRAN) IV, oxyCODONE, sodium chloride flush, sodium chloride flush, traMADol   Vital Signs    Vitals:   05/25/19 0530 05/25/19 0545 05/25/19 0600 05/25/19 0615  BP:   122/64   Pulse: (!) 120 (!) 119 (!) 108 (!) 102  Resp: (!) 22 20 (!) 21 (!) 21  Temp:      TempSrc:      SpO2: 92% 93% 93% 91%  Weight:      Height:        Intake/Output Summary (Last 24 hours) at 05/25/2019 0738 Last data filed at 05/25/2019 0600 Gross per 24 hour  Intake 2320.86 ml  Output 2770 ml  Net -449.14 ml   Last 3 Weights 05/25/2019 05/24/2019 05/23/2019  Weight (lbs) 242 lb 1 oz 236 lb 8.9 oz 232 lb 4.8 oz  Weight (kg) 109.8 kg 107.3 kg 105.371 kg      Telemetry    Atrial fibrillation - Personally Reviewed  Physical Exam   GEN: No acute distress.  WD WN Neck: No JVD, supple Cardiac: irregular Respiratory: Diminished BS; s/p sternotomy GI: Soft, NT/ND MS: No edema Neuro:  Grossly intact Psych: Normal affect  Labs    High Sensitivity Troponin:   Recent Labs  Lab 05/10/19 1329 05/11/19 1104 05/11/19 1330 05/21/19 0526 05/21/19 0703  TROPONINIHS 18* 14 14 8 8       Chemistry Recent Labs  Lab 05/24/19 0412 05/24/19 0431 05/24/19 0957 05/24/19 1620 05/25/19 0330  NA 138   < > 139 137 136  K 4.3   < > 4.3 4.2 3.9  CL 104  --   --  103 101  CO2 24  --   --  28 25  GLUCOSE 119*  --   --  159* 190*  BUN 11  --   --  11 10  CREATININE 1.08  --   --  1.28* 1.12  CALCIUM 7.9*  --   --  8.2* 8.0*  GFRNONAA >60  --   --  58* >60  GFRAA >60  --   --  >60 >60  ANIONGAP 10  --   --  6 10   < > = values in this interval not displayed.     Hematology Recent Labs  Lab 05/24/19 0412 05/24/19 0431 05/24/19 0957 05/24/19 1620 05/25/19 0330  WBC 17.4*  --   --  19.8* 18.5*  RBC 4.96  --   --  4.73 4.59  HGB 15.3   < > 14.6 14.9 14.3  HCT 45.9   < > 43.0 43.6 42.5  MCV 92.5  --   --  92.2  92.6  MCH 30.8  --   --  31.5 31.2  MCHC 33.3  --   --  34.2 33.6  RDW 14.5  --   --  14.5 14.6  PLT 133*  --   --  122* 119*   < > = values in this interval not displayed.    BNP Recent Labs  Lab 05/21/19 0526  BNP 122.3*     Radiology    DG Chest [facility] 1 View  Result Date: 05/24/2019 CLINICAL DATA:  Status post CABG. EXAM: PORTABLE CHEST 1 VIEW COMPARISON:  Chest x-ray from yesterday. FINDINGS: Unchanged endotracheal tube, right internal jugular Swan-Ganz catheter, mediastinal drain, and left chest tube. Unchanged left chest wall pacemaker. Stable cardiomediastinal silhouette status post CABG. Decreased small right pleural effusion with mildly improved aeration at the right lung base. Increasing atelectasis at the left lung base with trace left pleural effusion. No pneumothorax. No acute osseous abnormality. IMPRESSION: 1. Decreased small right pleural effusion with improving aeration at the right lung base. 2. Increasing atelectasis at the left lung base with trace left pleural effusion. Electronically Signed   By: Titus Dubin M.D.   On: 05/24/2019 07:02   DG Chest Port 1 View  Result Date: 05/23/2019 CLINICAL DATA:  Post CABG EXAM: PORTABLE CHEST 1 VIEW COMPARISON:  05/21/2019 FINDINGS: Changes of CABG. Endotracheal tube is 7 cm above the carina. Right central line tip in the SVC. Left AICD is unchanged. No pneumothorax. Cardiomegaly with vascular congestion. Increasing right effusion and right base atelectasis. IMPRESSION: Postoperative changes.  No pneumothorax. Increasing right effusion with right base atelectasis. Electronically Signed   By: Rolm Baptise M.D.   On: 05/23/2019 14:13    Patient Profile     69 y.o. male with past medical history of coronary artery disease, ischemic cardiomyopathy, chronic systolic congestive heart failure, prior ICD, atrial fibrillation, COPD, obstructive sleep apnea admitted with chest pain.  Patient recently discharged following admission for  ventricular tachycardia.  Cardiac catheterization on April 30 showed 95 to  99% circumflex followed by a 90% lesion, 50 to 65% proximal to mid LAD, 70% first diagonal.  It was felt that the likelihood of durable circumflex result from PCI would be low in LAD/diagonal could be grafted.  Coronary artery bypass and graft was recommended but patient declined.  Echocardiogram showed ejection fraction 40 to 45%, grade 2 diastolic dysfunction, mild mitral regurgitation and mild left atrial enlargement.  However he developed recurrent chest pain and returned.  He is now status post coronary artery bypass and graft.  Assessment & Plan    1 CAD/s/p CABG-patient is pain-plan to continue aspirin and Lipitor.  Milrinone has been discontinued.  Wean Norepinephrine as tolerated.  2 paroxysmal atrial fibrillation-back in atrial fibrillation today.  I agree with IV amiodarone.  Blood pressure is borderline so will not add beta-blockade.  Agree with digoxin.  Will need to resume Eliquis at discharge.   3 recent ventricular tachycardia-patient will continue on amiodarone.  ICD in place.  Previous VT felt secondary to ischemia.  4 history of COPD  For questions or updates, please contact [facility] Please consult [email or web address] for contact info under        Signed, Olga Millers, MD  05/25/2019, 7:38 AM

## 2019-05-26 ENCOUNTER — Inpatient Hospital Stay (HOSPITAL_COMMUNITY): Payer: No Typology Code available for payment source

## 2019-05-26 LAB — GLUCOSE, CAPILLARY
Glucose-Capillary: 120 mg/dL — ABNORMAL HIGH (ref 70–99)
Glucose-Capillary: 141 mg/dL — ABNORMAL HIGH (ref 70–99)
Glucose-Capillary: 115 mg/dL — ABNORMAL HIGH (ref 70–99)
Glucose-Capillary: 118 mg/dL — ABNORMAL HIGH (ref 70–99)
Glucose-Capillary: 108 mg/dL — ABNORMAL HIGH (ref 70–99)

## 2019-05-26 LAB — CBC
HCT: 39.7 % (ref 39.0–52.0)
Hemoglobin: 13.5 g/dL (ref 13.0–17.0)
MCH: 31.6 pg (ref 26.0–34.0)
MCHC: 34 g/dL (ref 30.0–36.0)
MCV: 93 fL (ref 80.0–100.0)
Platelets: 102 10*3/uL — ABNORMAL LOW (ref 150–400)
RBC: 4.27 MIL/uL (ref 4.22–5.81)
RDW: 14.9 % (ref 11.5–15.5)
WBC: 15.1 10*3/uL — ABNORMAL HIGH (ref 4.0–10.5)
nRBC: 0 % (ref 0.0–0.2)

## 2019-05-26 LAB — BASIC METABOLIC PANEL
Anion gap: 7 (ref 5–15)
BUN: 9 mg/dL (ref 8–23)
CO2: 26 mmol/L (ref 22–32)
Calcium: 8.1 mg/dL — ABNORMAL LOW (ref 8.9–10.3)
Chloride: 104 mmol/L (ref 98–111)
Creatinine, Ser: 1.02 mg/dL (ref 0.61–1.24)
GFR calc Af Amer: >60 mL/min (ref >60)
GFR calc non Af Amer: >60 mL/min (ref >60)
Glucose, Bld: 127 mg/dL — ABNORMAL HIGH (ref 70–99)
Potassium: 4.1 mmol/L (ref 3.5–5.1)
Sodium: 137 mmol/L (ref 135–145)

## 2019-05-26 LAB — COOXEMETRY PANEL
Carboxyhemoglobin: 1.3 % (ref 0.5–1.5)
Methemoglobin: 1.2 % (ref 0.0–1.5)
O2 Saturation: 63.5 %
Total hemoglobin: 14 g/dL (ref 12.0–16.0)

## 2019-05-26 MED ORDER — FUROSEMIDE 10 MG/ML IJ SOLN
40.0000 mg | Freq: Once | INTRAMUSCULAR | Status: AC
  Administered 2019-05-26: 40 mg via INTRAVENOUS
  Filled 2019-05-26: qty 4

## 2019-05-26 MED ORDER — INSULIN ASPART 100 UNIT/ML ~~LOC~~ SOLN
2.0000 [IU] | Freq: Three times a day (TID) | SUBCUTANEOUS | Status: DC
  Administered 2019-05-26: 2 [IU] via SUBCUTANEOUS

## 2019-05-26 MED ORDER — POTASSIUM CHLORIDE CRYS ER 20 MEQ PO TBCR
20.0000 meq | EXTENDED_RELEASE_TABLET | Freq: Two times a day (BID) | ORAL | Status: AC
  Administered 2019-05-26 (×2): 20 meq via ORAL
  Filled 2019-05-26 (×2): qty 1

## 2019-05-26 MED ORDER — TRAMADOL HCL 50 MG PO TABS
50.0000 mg | ORAL_TABLET | ORAL | Status: DC | PRN
  Administered 2019-05-26 – 2019-05-31 (×7): 50 mg via ORAL
  Filled 2019-05-26 (×7): qty 1

## 2019-05-26 NOTE — Progress Notes (Signed)
[facility] BRIEF SICU PROGRESS NOTE  3 Days Post-Op  S/P Procedure(s) (LRB): CORONARY ARTERY BYPASS GRAFTING (CABG), times three, using right greater saphenous vein and left internal mammary (N/A) CLIPPING OF ATRIAL APPENDAGE with atriclip (N/A) TRANSESOPHAGEAL ECHOCARDIOGRAM (TEE) (N/A)   Stable day Reports that breathing is better Rate controlled Afib w/ stable BP  Plan: Continue current plan  Purcell Nails, MD 05/26/2019 6:42 PM

## 2019-05-26 NOTE — Progress Notes (Signed)
Pt refused to ambulate and turn in bed. He stated he "he is done for the day, and would like to stay in bed''.

## 2019-05-26 NOTE — Progress Notes (Signed)
Pt ambulated 25 feet. Pt stated that he "felt weak". Pt ambulated back to bed. Sp02 92-97 on 6L during ambulation. 02 dec back to 4L when in bed. Pt resting, RN will continue to monitor.

## 2019-05-26 NOTE — Progress Notes (Signed)
3 Days Post-Op Procedure(s) (LRB): CORONARY ARTERY BYPASS GRAFTING (CABG), times three, using right greater saphenous vein and left internal mammary (N/A) CLIPPING OF ATRIAL APPENDAGE with atriclip (N/A) TRANSESOPHAGEAL ECHOCARDIOGRAM (TEE) (N/A) Subjective: No complaints. Sitting up in chair, working on IS.  Objective: Vital signs in last 24 hours: Temp:  [97.6 F (36.4 C)-98.4 F (36.9 C)] 98.4 F (36.9 C) (05/14 0700) Pulse Rate:  [75-126] 126 (05/14 0700) Cardiac Rhythm: Atrial fibrillation (05/14 0450) Resp:  [18-35] 21 (05/14 0700) BP: (110-179)/(50-96) 128/96 (05/14 0700) SpO2:  [84 %-95 %] 84 % (05/14 0700) Arterial Line BP: (89-171)/(46-68) 114/51 (05/14 0700) Weight:  [110.3 kg] 110.3 kg (05/14 0500)  Hemodynamic parameters for last 24 hours:    Intake/Output from previous day: 05/13 0701 - 05/14 0700 In: 3036 [I.V.:3036] Out: 1300 [Urine:1300] Intake/Output this shift: No intake/output data recorded.  General appearance: alert and cooperative Neurologic: intact Heart: irregularly irregular rhythm Lungs: clear to auscultation bilaterally Extremities: edema mild Wound: incision ok  Lab Results: Recent Labs    05/25/19 0330 05/26/19 0410  WBC 18.5* 15.1*  HGB 14.3 13.5  HCT 42.5 39.7  PLT 119* 102*   BMET:  Recent Labs    05/25/19 0330 05/26/19 0410  NA 136 137  K 3.9 4.1  CL 101 104  CO2 25 26  GLUCOSE 190* 127*  BUN 10 9  CREATININE 1.12 1.02  CALCIUM 8.0* 8.1*    PT/INR:  Recent Labs    05/23/19 1330  LABPROT 16.1*  INR 1.3*   ABG    Component Value Date/Time   PHART 7.347 (L) 05/24/2019 0957   HCO3 26.2 05/24/2019 0957   TCO2 28 05/24/2019 0957   ACIDBASEDEF 3.0 (H) 05/23/2019 1330   O2SAT 63.5 05/26/2019 0426   CBG (last 3)  Recent Labs    05/25/19 2338 05/26/19 0315 05/26/19 0733  GLUCAP 125* 120* 141*   CXR: bibasilar atelectasis  Assessment/Plan: S/P Procedure(s) (LRB): CORONARY ARTERY BYPASS GRAFTING (CABG),  times three, using right greater saphenous vein and left internal mammary (N/A) CLIPPING OF ATRIAL APPENDAGE with atriclip (N/A) TRANSESOPHAGEAL ECHOCARDIOGRAM (TEE) (N/A)  POD 3  Hemodynamically stable. Co-ox 63.5. wean off dopamine and levophed.  PAF: continue amio and digoxin.  Plan to start Eliquis at discharge.  Volume excess: wt is 11 lbs over preop. Start diuresis.  DM: glucose under adequate control. Continue Levemir BID and SSI.  DC foley  IS, ambulation.   LOS: 5 days    [name] 05/26/2019

## 2019-05-27 DIAGNOSIS — I4819 Other persistent atrial fibrillation: Secondary | ICD-10-CM

## 2019-05-27 DIAGNOSIS — I255 Ischemic cardiomyopathy: Secondary | ICD-10-CM

## 2019-05-27 LAB — GLUCOSE, CAPILLARY
Glucose-Capillary: 97 mg/dL (ref 70–99)
Glucose-Capillary: 119 mg/dL — ABNORMAL HIGH (ref 70–99)
Glucose-Capillary: 119 mg/dL — ABNORMAL HIGH (ref 70–99)

## 2019-05-27 LAB — CBC
HCT: 35.4 % — ABNORMAL LOW (ref 39.0–52.0)
Hemoglobin: 11.9 g/dL — ABNORMAL LOW (ref 13.0–17.0)
MCH: 31.4 pg (ref 26.0–34.0)
MCHC: 33.6 g/dL (ref 30.0–36.0)
MCV: 93.4 fL (ref 80.0–100.0)
Platelets: 106 10*3/uL — ABNORMAL LOW (ref 150–400)
RBC: 3.79 MIL/uL — ABNORMAL LOW (ref 4.22–5.81)
RDW: 15.4 % (ref 11.5–15.5)
WBC: 9.8 10*3/uL (ref 4.0–10.5)
nRBC: 0 % (ref 0.0–0.2)

## 2019-05-27 LAB — BASIC METABOLIC PANEL
Anion gap: 7 (ref 5–15)
BUN: 17 mg/dL (ref 8–23)
CO2: 27 mmol/L (ref 22–32)
Calcium: 7.8 mg/dL — ABNORMAL LOW (ref 8.9–10.3)
Chloride: 102 mmol/L (ref 98–111)
Creatinine, Ser: 1.1 mg/dL (ref 0.61–1.24)
GFR calc Af Amer: >60 mL/min (ref >60)
GFR calc non Af Amer: >60 mL/min (ref >60)
Glucose, Bld: 100 mg/dL — ABNORMAL HIGH (ref 70–99)
Potassium: 4.2 mmol/L (ref 3.5–5.1)
Sodium: 136 mmol/L (ref 135–145)

## 2019-05-27 LAB — COOXEMETRY PANEL
Carboxyhemoglobin: 1.4 % (ref 0.5–1.5)
Methemoglobin: 1.3 % (ref 0.0–1.5)
O2 Saturation: 60.1 %
Total hemoglobin: 13.4 g/dL (ref 12.0–16.0)

## 2019-05-27 MED ORDER — ENOXAPARIN SODIUM 40 MG/0.4ML ~~LOC~~ SOLN
40.0000 mg | SUBCUTANEOUS | Status: DC
  Administered 2019-05-27 – 2019-05-29 (×3): 40 mg via SUBCUTANEOUS
  Filled 2019-05-27 (×4): qty 0.4

## 2019-05-27 MED ORDER — ~~LOC~~ CARDIAC SURGERY, PATIENT & FAMILY EDUCATION: Freq: Once | Status: AC

## 2019-05-27 MED ORDER — FUROSEMIDE 10 MG/ML IJ SOLN
40.0000 mg | Freq: Two times a day (BID) | INTRAMUSCULAR | Status: AC
  Administered 2019-05-27 – 2019-05-28 (×4): 40 mg via INTRAVENOUS
  Filled 2019-05-27 (×4): qty 4

## 2019-05-27 MED ORDER — ASPIRIN EC 81 MG PO TBEC
81.0000 mg | DELAYED_RELEASE_TABLET | Freq: Every day | ORAL | Status: DC
  Administered 2019-05-27 – 2019-06-01 (×6): 81 mg via ORAL
  Filled 2019-05-27 (×6): qty 1

## 2019-05-27 MED ORDER — AMIODARONE HCL 200 MG PO TABS
200.0000 mg | ORAL_TABLET | Freq: Two times a day (BID) | ORAL | Status: DC
  Administered 2019-05-27 – 2019-06-01 (×11): 200 mg via ORAL
  Filled 2019-05-27 (×11): qty 1

## 2019-05-27 MED ORDER — FUROSEMIDE 10 MG/ML IJ SOLN: 40.0000 mg | Freq: Once | INTRAMUSCULAR | Status: DC

## 2019-05-27 MED ORDER — LEVALBUTEROL HCL 1.25 MG/0.5ML IN NEBU
1.2500 mg | INHALATION_SOLUTION | Freq: Two times a day (BID) | RESPIRATORY_TRACT | Status: DC
  Administered 2019-05-27 – 2019-06-01 (×10): 1.25 mg via RESPIRATORY_TRACT
  Filled 2019-05-27 (×10): qty 0.5

## 2019-05-27 NOTE — Progress Notes (Addendum)
      [address]       [place] [address]             [phone]        CARDIOTHORACIC SURGERY PROGRESS NOTE   R4 Days Post-Op Procedure(s) (LRB): CORONARY ARTERY BYPASS GRAFTING (CABG), times three, using right greater saphenous vein and left internal mammary (N/A) CLIPPING OF ATRIAL APPENDAGE with atriclip (N/A) TRANSESOPHAGEAL ECHOCARDIOGRAM (TEE) (N/A)  Subjective: Feels pretty good.  Breathing better.  Productive cough.  Chest discomfort w/ cough  Objective: Vital signs: BP Readings from Last 1 Encounters:  05/27/19 104/69   Pulse Readings from Last 1 Encounters:  05/27/19 85   Resp Readings from Last 1 Encounters:  05/27/19 (!) 22   Temp Readings from Last 1 Encounters:  05/27/19 97.8 F (36.6 C) (Oral)    Hemodynamics:   Mixed venous co-ox 60%   Physical Exam:  Rhythm:   Afib w/ controlled rate  Breath sounds: Scattered rhonchi  Heart sounds:  irregular  Incisions:  Clean and dry  Abdomen:  Soft, non-distended, non-tender  Extremities:  Warm, well-perfused   Intake/Output from previous day: 05/14 0701 - 05/15 0700 In: 1059.3 [P.O.:580; I.V.:479.3] Out: 1200 [Urine:1200] Intake/Output this shift: No intake/output data recorded.  Lab Results:  CBC: Recent Labs    05/26/19 0410 05/27/19 0423  WBC 15.1* 9.8  HGB 13.5 11.9*  HCT 39.7 35.4*  PLT 102* 106*    BMET:  Recent Labs    05/26/19 0410 05/27/19 0423  NA 137 136  K 4.1 4.2  CL 104 102  CO2 26 27  GLUCOSE 127* 100*  BUN 9 17  CREATININE 1.02 1.10  CALCIUM 8.1* 7.8*     PT/INR:  No results for input(s): LABPROT, INR in the last 72 hours.  CBG (last 3)  Recent Labs    05/26/19 1524 05/26/19 2206 05/27/19 0645  GLUCAP 118* 108* 97    ABG    Component Value Date/Time   PHART 7.347 (L) 05/24/2019 0957   PCO2ART 48.3 (H) 05/24/2019 0957   PO2ART 113 (H) 05/24/2019 0957   HCO3 26.2 05/24/2019 0957   TCO2 28 05/24/2019 0957   ACIDBASEDEF 3.0 (H)  05/23/2019 1330   O2SAT 60.1 05/27/2019 0423    CXR: n/a  Assessment/Plan: S/P Procedure(s) (LRB): CORONARY ARTERY BYPASS GRAFTING (CABG), times three, using right greater saphenous vein and left internal mammary (N/A) CLIPPING OF ATRIAL APPENDAGE with atriclip (N/A) TRANSESOPHAGEAL ECHOCARDIOGRAM (TEE) (N/A)  Overall improving now POD4 Rate-controlled Afib w/ stable BP with co-ox 60% off drips Breathing improved, maintaining O2 sats 93-98% on 4 L/min via Long   Mobilize  Diuresis  Pulm toilet and nebs  Convert amiodarone to oral   Low dose Lovenox and restart Eliquis at hospital D/C  Purcell Nails, MD 05/27/2019 9:29 AM

## 2019-05-27 NOTE — Progress Notes (Signed)
Progress Note  Patient Name: Max Rivas Date of Encounter: 05/27/2019  Primary Cardiologist: Lesleigh Noe, MD  Subjective   Sitting in bedside chair.  Ambulated 3 times yesterday.  Feels bloated and has had hand and leg swelling.  No palpitations.  Inpatient Medications    Scheduled Meds: . sodium chloride   Intravenous Once  . acetaminophen  1,000 mg Oral Q6H   Or  . acetaminophen (TYLENOL) oral liquid 160 mg/5 mL  1,000 mg Per Tube Q6H  . aspirin EC  325 mg Oral Daily   Or  . aspirin  324 mg Per Tube Daily  . atorvastatin  80 mg Oral Daily  . bisacodyl  10 mg Oral Daily   Or  . bisacodyl  10 mg Rectal Daily  . budesonide  0.25 mg Inhalation BID  . Chlorhexidine Gluconate Cloth  6 each Topical Daily  . digoxin  0.125 mg Oral Daily  . docusate sodium  200 mg Oral Daily  . enoxaparin (LOVENOX) injection  40 mg Subcutaneous QHS  . insulin aspart  2-6 Units Subcutaneous TID WC  . insulin detemir  15 Units Subcutaneous Q12H  . levalbuterol  1.25 mg Nebulization BID  . levothyroxine  75 mcg Oral Q0600  . mouth rinse  15 mL Mouth Rinse BID  . midodrine  10 mg Oral TID WC  . montelukast  10 mg Oral BID  . multivitamin with minerals  1 tablet Oral Daily  . omega-3 acid ethyl esters  1 g Oral BID  . pantoprazole  40 mg Oral Daily  . polyvinyl alcohol  1 drop Both Eyes QHS  . sodium chloride flush  10-40 mL Intracatheter Q12H  . sodium chloride flush  3 mL Intravenous Q12H  . umeclidinium-vilanterol  1 puff Inhalation Daily   Continuous Infusions: . sodium chloride Stopped (05/24/19 1736)  . sodium chloride Stopped (05/24/19 0507)  . sodium chloride Stopped (05/26/19 1046)  . amiodarone 30 mg/hr (05/27/19 0700)  . lactated ringers    . lactated ringers Stopped (05/24/19 2023)  . norepinephrine (LEVOPHED) Adult infusion Stopped (05/26/19 0944)   PRN Meds: sodium chloride, ALPRAZolam, dextrose, morphine injection, ondansetron (ZOFRAN) IV, oxyCODONE, sodium  chloride flush, sodium chloride flush, traMADol   Vital Signs    Vitals:   05/27/19 0400 05/27/19 0500 05/27/19 0600 05/27/19 0700  BP: 118/79 107/64 104/69   Pulse: 79 77 85   Resp: 19 18 (!) 22   Temp: 98 F (36.7 C)   97.8 F (36.6 C)  TempSrc: Oral   Oral  SpO2: 98% 95% 95%   Weight:  109.9 kg    Height:        Intake/Output Summary (Last 24 hours) at 05/27/2019 0751 Last data filed at 05/27/2019 0700 Gross per 24 hour  Intake 1059.25 ml  Output 1200 ml  Net -140.75 ml   Filed Weights   05/25/19 0500 05/26/19 0500 05/27/19 0500  Weight: 109.8 kg 110.3 kg 109.9 kg    Telemetry    Rate controlled atrial fibrillation.  Rare PVC.  Personally reviewed.  Physical Exam   GEN: No acute distress.   Neck:  Increased JVP. Cardiac:  Irregularly irregular, soft systolic murmur, no gallop.  Respiratory: Nonlabored. Clear, faint basilar crackles. Thorax: Sternal incision intact. GI: Soft, nontender, bowel sounds present. MS:  Bilateral arm and leg edema; No deformity. Neuro:  Nonfocal. Psych: Alert and oriented x 3. Normal affect.  Labs    Chemistry Recent Labs  Lab  05/25/19 0330 05/26/19 0410 05/27/19 0423  NA 136 137 136  K 3.9 4.1 4.2  CL 101 104 102  CO2 25 26 27   GLUCOSE 190* 127* 100*  BUN 10 9 17   CREATININE 1.12 1.02 1.10  CALCIUM 8.0* 8.1* 7.8*  GFRNONAA >60 >60 >60  GFRAA >60 >60 >60  ANIONGAP 10 7 7      Hematology Recent Labs  Lab 05/25/19 0330 05/26/19 0410 05/27/19 0423  WBC 18.5* 15.1* 9.8  RBC 4.59 4.27 3.79*  HGB 14.3 13.5 11.9*  HCT 42.5 39.7 35.4*  MCV 92.6 93.0 93.4  MCH 31.2 31.6 31.4  MCHC 33.6 34.0 33.6  RDW 14.6 14.9 15.4  PLT 119* 102* 106*    Cardiac Enzymes Recent Labs  Lab 05/10/19 1329 05/11/19 1104 05/11/19 1330 05/21/19 0526 05/21/19 0703  TROPONINIHS 18* 14 14 8 8     BNP Recent Labs  Lab 05/21/19 0526  BNP 122.3*     Radiology    DG CHEST PORT 1 VIEW  Result Date: 05/26/2019 CLINICAL DATA:   Status post CABG. EXAM: PORTABLE CHEST 1 VIEW COMPARISON:  Chest x-ray from yesterday. FINDINGS: Interval removal of the mediastinal drain and left chest tube. Unchanged right internal jugular sheath and left chest wall pacemaker. Stable cardiomediastinal silhouette status post CABG and left atrial appendage clipping. Normal pulmonary vascularity. Unchanged bibasilar atelectasis. No pneumothorax or large pleural effusion. No acute osseous abnormality. IMPRESSION: 1. Unchanged bibasilar atelectasis. 2. No pneumothorax status post left chest tube removal. Electronically Signed   By: 07/21/19 M.D.   On: 05/26/2019 07:25    Cardiac Studies   Echocardiogram 05/13/2019: 1. Akinesis of the inferior and inferolateral walls with overall mild to  moderate LV dysfunction; grade 2 diastolic dysfunction; mild MR; mild LAE.  2. Left ventricular ejection fraction, by estimation, is 40 to 45%. The  left ventricle has mildly decreased function. The left ventricle  demonstrates regional wall motion abnormalities (see scoring  diagram/findings for description). The left ventricular  internal cavity size was moderately dilated. Left ventricular diastolic  parameters are consistent with Grade II diastolic dysfunction  (pseudonormalization).  3. Right ventricular systolic function is normal. The right ventricular  size is normal.  4. Left atrial size was mildly dilated.  5. The mitral valve is normal in structure. Mild mitral valve  regurgitation. No evidence of mitral stenosis.  6. The aortic valve has an indeterminant number of cusps. Aortic valve  regurgitation is not visualized. No aortic stenosis is present.  7. The inferior vena cava is normal in size with greater than 50%  respiratory variability, suggesting right atrial pressure of 3 mmHg.   Patient Profile     69 y.o. male with a history of CAD, ischemic cardiomyopathy with ICD, atrial fibrillation, COPD, and OSA now status post CABG with  clipping of the left atrial appendage.  Assessment & Plan    1.  Postop day #4 status post CABG with clipping of left atrial appendage. Chest tube out, chest x-ray shows bibasilar atelectasis.  He has evidence of fluid overload with weight up from preoperative state.  He is hemodynamically stable in atrial fibrillation and now off norepinephrine.  Recent co-ox 60.  He is on aspirin and Lipitor.  2.  Persistent atrial fibrillation, rate controlled on IV amiodarone at this time.  Ultimately plan to resume Eliquis at discharge.  3.  History of VT, currently quiescent.  4.  Ischemic cardiomyopathy, LVEF 40 to 45% by preoperative echocardiogram.  [employer] ICD in  place.  Currently on Lanoxin.  Continue aspirin, Lipitor, and Lanoxin.  Will give dose of IV Lasix 40 mg today.  Would eventually plan to add beta-blocker once fluid status better optimized and blood pressure stable.  Signed, Rozann Lesches, MD  05/27/2019, 7:51 AM

## 2019-05-28 ENCOUNTER — Inpatient Hospital Stay (HOSPITAL_COMMUNITY): Payer: No Typology Code available for payment source

## 2019-05-28 LAB — BASIC METABOLIC PANEL
Anion gap: 10 (ref 5–15)
BUN: 17 mg/dL (ref 8–23)
CO2: 29 mmol/L (ref 22–32)
Calcium: 8.3 mg/dL — ABNORMAL LOW (ref 8.9–10.3)
Chloride: 99 mmol/L (ref 98–111)
Creatinine, Ser: 1.28 mg/dL — ABNORMAL HIGH (ref 0.61–1.24)
GFR calc Af Amer: >60 mL/min (ref >60)
GFR calc non Af Amer: 58 mL/min — ABNORMAL LOW (ref >60)
Glucose, Bld: 94 mg/dL (ref 70–99)
Potassium: 4.3 mmol/L (ref 3.5–5.1)
Sodium: 138 mmol/L (ref 135–145)

## 2019-05-28 LAB — GLUCOSE, CAPILLARY
Glucose-Capillary: 105 mg/dL — ABNORMAL HIGH (ref 70–99)
Glucose-Capillary: 104 mg/dL — ABNORMAL HIGH (ref 70–99)
Glucose-Capillary: 95 mg/dL (ref 70–99)
Glucose-Capillary: 89 mg/dL (ref 70–99)

## 2019-05-28 LAB — EXPECTORATED SPUTUM ASSESSMENT W GRAM STAIN, RFLX TO RESP C

## 2019-05-28 LAB — CBC
HCT: 36.8 % — ABNORMAL LOW (ref 39.0–52.0)
Hemoglobin: 12.2 g/dL — ABNORMAL LOW (ref 13.0–17.0)
MCH: 31.3 pg (ref 26.0–34.0)
MCHC: 33.2 g/dL (ref 30.0–36.0)
MCV: 94.4 fL (ref 80.0–100.0)
Platelets: 142 10*3/uL — ABNORMAL LOW (ref 150–400)
RBC: 3.9 MIL/uL — ABNORMAL LOW (ref 4.22–5.81)
RDW: 15.8 % — ABNORMAL HIGH (ref 11.5–15.5)
WBC: 9.4 10*3/uL (ref 4.0–10.5)
nRBC: 0 % (ref 0.0–0.2)

## 2019-05-28 MED ORDER — GUAIFENESIN ER 600 MG PO TB12
1200.0000 mg | ORAL_TABLET | Freq: Two times a day (BID) | ORAL | Status: AC
  Administered 2019-05-28 – 2019-05-30 (×6): 1200 mg via ORAL
  Filled 2019-05-28 (×6): qty 2

## 2019-05-28 MED ORDER — SODIUM CHLORIDE 0.9 % IV SOLN
2.0000 g | Freq: Three times a day (TID) | INTRAVENOUS | Status: DC
  Administered 2019-05-28 – 2019-05-29 (×3): 2 g via INTRAVENOUS
  Filled 2019-05-28 (×7): qty 2

## 2019-05-28 NOTE — Progress Notes (Signed)
Pt received from 2H to 4e09. Oriented to room and call bell. CHG wipes applied. Tele box connected to patient and CCMD called. VSS. Call bell in reach. Will continue to monitor.  Hazle Nordmann, RN

## 2019-05-28 NOTE — Progress Notes (Signed)
Pharmacy Antibiotic Note  Max Rivas is a 69 y.o. male admitted on 05/21/2019 with bronchitis, possible PNA.  Pharmacy has been consulted for cefepime dosing.  Plan: Cefepime 2g IV q 8hrs. F/u renal function, cultures, length of therapy.  Height: 5\' 11"  (180.3 cm) Weight: 109.3 kg (240 lb 15.4 oz) IBW/kg (Calculated) : 75.3  Temp (24hrs), Avg:97.8 F (36.6 C), Min:97.6 F (36.4 C), Max:98 F (36.7 C)  Recent Labs  Lab 05/24/19 1620 05/25/19 0330 05/26/19 0410 05/27/19 0423 05/28/19 0203  WBC 19.8* 18.5* 15.1* 9.8 9.4  CREATININE 1.28* 1.12 1.02 1.10 1.28*    Estimated Creatinine Clearance: 70.4 mL/min (A) (by C-G formula based on SCr of 1.28 mg/dL (H)).    Allergies  Allergen Reactions  . Crestor [Rosuvastatin Calcium] Other (See Comments)    Back spasms, but can tolerate it at a low dose now    Antimicrobials this admission: Cefuroxime 5/11 > 5/13 Vancomycin 5/11 >  Cefepime 5/16 >  Dose adjustments this admission:  Microbiology results: 5/16 Sputum >   Thank you for allowing pharmacy to be a part of this patient's care.  6/16, [facility] Clinical Pharmacist Phone [phone]  05/28/2019 1:20 PM

## 2019-05-28 NOTE — Progress Notes (Addendum)
[address]       [place] [address]             [phone]        CARDIOTHORACIC SURGERY PROGRESS NOTE   R5 Days Post-Op Procedure(s) (LRB): CORONARY ARTERY BYPASS GRAFTING (CABG), times three, using right greater saphenous vein and left internal mammary (N/A) CLIPPING OF ATRIAL APPENDAGE with atriclip (N/A) TRANSESOPHAGEAL ECHOCARDIOGRAM (TEE) (N/A)  Subjective: Feels pretty well.  Denies SOB.  Productive cough persists but improving.  Biggest complaint is pain in feet w/ ambulation which is chronic and likely secondary to neuropathy  Objective: Vital signs: BP Readings from Last 1 Encounters:  05/28/19 94/65   Pulse Readings from Last 1 Encounters:  05/28/19 (!) 55   Resp Readings from Last 1 Encounters:  05/28/19 (!) 22   Temp Readings from Last 1 Encounters:  05/28/19 97.8 F (36.6 C) (Oral)    Hemodynamics:    Physical Exam:  Rhythm:   Afib w/ controlled rate  Breath sounds: Coarse w/ scattered rhonchi  Heart sounds:  irregular  Incisions:  Clean and dry  Abdomen:  Soft, non-distended, non-tender  Extremities:  Warm, well-perfused    Intake/Output from previous day: 05/15 0701 - 05/16 0700 In: 827.2 [P.O.:710; I.V.:117.2] Out: 9381 [Urine:3375] Intake/Output this shift: Total I/O In: 120 [P.O.:120] Out: 1000 [Urine:1000]  Lab Results:  CBC: Recent Labs    05/27/19 0423 05/28/19 0203  WBC 9.8 9.4  HGB 11.9* 12.2*  HCT 35.4* 36.8*  PLT 106* 142*    BMET:  Recent Labs    05/27/19 0423 05/28/19 0203  NA 136 138  K 4.2 4.3  CL 102 99  CO2 27 29  GLUCOSE 100* 94  BUN 17 17  CREATININE 1.10 1.28*  CALCIUM 7.8* 8.3*     PT/INR:  No results for input(s): LABPROT, INR in the last 72 hours.  CBG (last 3)  Recent Labs    05/27/19 1151 05/27/19 1507 05/28/19 0647  GLUCAP 119* 119* 105*    ABG    Component Value Date/Time   PHART 7.347 (L) 05/24/2019 0957   PCO2ART 48.3 (H) 05/24/2019 0957   PO2ART 113  (H) 05/24/2019 0957   HCO3 26.2 05/24/2019 0957   TCO2 28 05/24/2019 0957   ACIDBASEDEF 3.0 (H) 05/23/2019 1330   O2SAT 60.1 05/27/2019 0423    CXR: CHEST - 2 VIEW  COMPARISON:  Chest x-rays dated 05/26/2019 and 05/25/2019.  FINDINGS: Stable cardiomegaly. LEFT chest wall pacemaker/ICD apparatus in place. Stable bibasilar atelectasis and/or small bilateral pleural effusions. No pneumothorax is seen.  IMPRESSION: Stable chest x-ray. Stable cardiomegaly. Stable bibasilar atelectasis and/or small bilateral pleural effusions.   Electronically Signed   By: Franki Cabot M.D.   On: 05/28/2019 07:13  Assessment/Plan: S/P Procedure(s) (LRB): CORONARY ARTERY BYPASS GRAFTING (CABG), times three, using right greater saphenous vein and left internal mammary (N/A) CLIPPING OF ATRIAL APPENDAGE with atriclip (N/A) TRANSESOPHAGEAL ECHOCARDIOGRAM (TEE) (N/A)  Overall stable, slowly improving now POD5 Rate-controlled Afib w/ stable BP Breathing comfortably w/ O2 sats 94-95% on O2 - CXR looks okay Acute on chronic combined systolic and diastolic CHF with expected post-op volume excess, diuresed well yesterday and weight down but still 2 kg > preop Chronic hypoxemic respiratory failure on home O2 Obesity OSA - doesn't tolerate CPAP at home COPD w/ recurrent bronchitis   Mobilize  Diuresis  Nebs  Humibid  Sputum culture and start empiric maxepime for bronchitis  Consider stopping amiodarone  if he doesn't convert out of Afib  Restart Eliquis at hospital D/C - continue Lovenox for now  Purcell Nails, MD 05/28/2019 10:01 AM

## 2019-05-29 LAB — CULTURE, RESPIRATORY W GRAM STAIN

## 2019-05-29 LAB — BASIC METABOLIC PANEL
Anion gap: 8 (ref 5–15)
BUN: 22 mg/dL (ref 8–23)
CO2: 28 mmol/L (ref 22–32)
Calcium: 8.2 mg/dL — ABNORMAL LOW (ref 8.9–10.3)
Chloride: 99 mmol/L (ref 98–111)
Creatinine, Ser: 1.18 mg/dL (ref 0.61–1.24)
GFR calc Af Amer: >60 mL/min (ref >60)
GFR calc non Af Amer: >60 mL/min (ref >60)
Glucose, Bld: 92 mg/dL (ref 70–99)
Potassium: 3.7 mmol/L (ref 3.5–5.1)
Sodium: 135 mmol/L (ref 135–145)

## 2019-05-29 LAB — CBC
HCT: 32.6 % — ABNORMAL LOW (ref 39.0–52.0)
Hemoglobin: 11 g/dL — ABNORMAL LOW (ref 13.0–17.0)
MCH: 31.5 pg (ref 26.0–34.0)
MCHC: 33.7 g/dL (ref 30.0–36.0)
MCV: 93.4 fL (ref 80.0–100.0)
Platelets: 173 10*3/uL (ref 150–400)
RBC: 3.49 MIL/uL — ABNORMAL LOW (ref 4.22–5.81)
RDW: 15.5 % (ref 11.5–15.5)
WBC: 8.9 10*3/uL (ref 4.0–10.5)
nRBC: 0 % (ref 0.0–0.2)

## 2019-05-29 LAB — GLUCOSE, CAPILLARY
Glucose-Capillary: 78 mg/dL (ref 70–99)
Glucose-Capillary: 117 mg/dL — ABNORMAL HIGH (ref 70–99)

## 2019-05-29 MED ORDER — POTASSIUM CHLORIDE CRYS ER 20 MEQ PO TBCR
20.0000 meq | EXTENDED_RELEASE_TABLET | Freq: Two times a day (BID) | ORAL | Status: AC
  Administered 2019-05-30 (×2): 20 meq via ORAL
  Filled 2019-05-29 (×2): qty 1

## 2019-05-29 MED ORDER — SODIUM CHLORIDE 0.9 % IV SOLN
2.0000 g | INTRAVENOUS | Status: DC
  Administered 2019-05-29 – 2019-06-01 (×4): 2 g via INTRAVENOUS
  Filled 2019-05-29 (×4): qty 2

## 2019-05-29 MED ORDER — METOLAZONE 5 MG PO TABS
2.5000 mg | ORAL_TABLET | Freq: Every day | ORAL | Status: DC
  Administered 2019-05-29: 2.5 mg via ORAL
  Filled 2019-05-29 (×2): qty 1

## 2019-05-29 MED ORDER — GABAPENTIN 100 MG PO CAPS
200.0000 mg | ORAL_CAPSULE | Freq: Two times a day (BID) | ORAL | Status: DC
  Administered 2019-05-29 – 2019-06-01 (×7): 200 mg via ORAL
  Filled 2019-05-29 (×7): qty 2

## 2019-05-29 MED ORDER — POTASSIUM CHLORIDE CRYS ER 20 MEQ PO TBCR
30.0000 meq | EXTENDED_RELEASE_TABLET | Freq: Once | ORAL | Status: AC
  Administered 2019-05-29: 30 meq via ORAL
  Filled 2019-05-29: qty 1

## 2019-05-29 MED ORDER — CEFTRIAXONE SODIUM 2 G IJ SOLR: 2.0000 g | INTRAMUSCULAR | Status: DC

## 2019-05-29 MED ORDER — FUROSEMIDE 40 MG PO TABS
40.0000 mg | ORAL_TABLET | Freq: Every day | ORAL | Status: DC
  Administered 2019-05-29 – 2019-05-30 (×2): 40 mg via ORAL
  Filled 2019-05-29 (×2): qty 1

## 2019-05-29 NOTE — Progress Notes (Signed)
Progress Note  Patient Name: Max Rivas Date of Encounter: 05/29/2019  Primary Cardiologist: Sinclair Grooms, MD    Subjective   69 year old gentleman with a history of coronary artery disease, ischemic cardiomyopathy, atrial fibrillation, COPD.  He is now status post coronary artery bypass grafting with a clipping of the left atrial appendage.  He has had orthostatic hypotension but is also volume overloaded.  He has diuresed 2.069 L so far during this admission.  Weight today is 109.1 kg. He remains in AFib  He has swelling of his left arm due to an infiltrated IV.  His right arm and hand are not swollen.  He has only a trace amount of edema in his legs.  He is ambulated twice today without any chest pain or dizziness.  He continues to complain of burning and tingling (peripheral neuropathy)  issues with his feet.   Inpatient Medications    Scheduled Meds: . amiodarone  200 mg Oral BID PC  . aspirin EC  81 mg Oral Daily  . atorvastatin  80 mg Oral Daily  . bisacodyl  10 mg Oral Daily   Or  . bisacodyl  10 mg Rectal Daily  . budesonide  0.25 mg Inhalation BID  . Chlorhexidine Gluconate Cloth  6 each Topical Daily  . digoxin  0.125 mg Oral Daily  . docusate sodium  200 mg Oral Daily  . enoxaparin (LOVENOX) injection  40 mg Subcutaneous Q24H  . furosemide  40 mg Oral Daily  . gabapentin  200 mg Oral BID  . guaiFENesin  1,200 mg Oral BID  . levalbuterol  1.25 mg Nebulization BID  . levothyroxine  75 mcg Oral Q0600  . mouth rinse  15 mL Mouth Rinse BID  . metolazone  2.5 mg Oral Daily  . midodrine  10 mg Oral TID WC  . montelukast  10 mg Oral BID  . multivitamin with minerals  1 tablet Oral Daily  . omega-3 acid ethyl esters  1 g Oral BID  . pantoprazole  40 mg Oral Daily  . polyvinyl alcohol  1 drop Both Eyes QHS  . [START ON 05/30/2019] potassium chloride  20 mEq Oral BID  . sodium chloride flush  10-40 mL Intracatheter Q12H  . sodium chloride flush  3 mL  Intravenous Q12H  . umeclidinium-vilanterol  1 puff Inhalation Daily   Continuous Infusions: . sodium chloride 250 mL (05/28/19 2352)  . ceFEPime (MAXIPIME) IV 2 g (05/29/19 0549)  . lactated ringers     PRN Meds: ALPRAZolam, dextrose, morphine injection, ondansetron (ZOFRAN) IV, oxyCODONE, sodium chloride flush, sodium chloride flush, traMADol   Vital Signs    Vitals:   05/29/19 0500 05/29/19 0719 05/29/19 0831 05/29/19 0910  BP:   (!) 121/57   Pulse: 73  73   Resp: 18  17   Temp:   97.7 F (36.5 C)   TempSrc:   Oral   SpO2: 94%  94% 93%  Weight:  109.1 kg    Height:        Intake/Output Summary (Last 24 hours) at 05/29/2019 0943 Last data filed at 05/29/2019 0833 Gross per 24 hour  Intake 1028 ml  Output 1630 ml  Net -602 ml   Last 3 Weights 05/29/2019 05/28/2019 05/27/2019  Weight (lbs) 240 lb 9.6 oz 240 lb 15.4 oz 242 lb 4.6 oz  Weight (kg) 109.135 kg 109.3 kg 109.9 kg      Telemetry    AF  - Personally  Reviewed  ECG     - Personally Reviewed  Physical Exam   GEN: No acute distress.   Neck: No JVD Cardiac:  irreg. Irreg.  Respiratory:   Scattered wheezes and rhonchi GI: Soft, nontender, non-distended  MS: No edema; No deformity. Neuro:  Nonfocal  Psych: Normal affect   Labs    High Sensitivity Troponin:   Recent Labs  Lab 05/10/19 1329 05/11/19 1104 05/11/19 1330 05/21/19 0526 05/21/19 0703  TROPONINIHS 18* 14 14 8 8       Chemistry Recent Labs  Lab 05/27/19 0423 05/28/19 0203 05/29/19 0238  NA 136 138 135  K 4.2 4.3 3.7  CL 102 99 99  CO2 27 29 28   GLUCOSE 100* 94 92  BUN 17 17 22   CREATININE 1.10 1.28* 1.18  CALCIUM 7.8* 8.3* 8.2*  GFRNONAA >60 58* >60  GFRAA >60 >60 >60  ANIONGAP 7 10 8      Hematology Recent Labs  Lab 05/27/19 0423 05/28/19 0203 05/29/19 0238  WBC 9.8 9.4 8.9  RBC 3.79* 3.90* 3.49*  HGB 11.9* 12.2* 11.0*  HCT 35.4* 36.8* 32.6*  MCV 93.4 94.4 93.4  MCH 31.4 31.3 31.5  MCHC 33.6 33.2 33.7  RDW 15.4  15.8* 15.5  PLT 106* 142* 173    BNPNo results for input(s): BNP, PROBNP in the last 168 hours.   DDimer No results for input(s): DDIMER in the last 168 hours.   Radiology    DG Chest 2 View  Result Date: 05/28/2019 CLINICAL DATA:  Congestive heart failure EXAM: CHEST - 2 VIEW COMPARISON:  Chest x-rays dated 05/26/2019 and 05/25/2019. FINDINGS: Stable cardiomegaly. LEFT chest wall pacemaker/ICD apparatus in place. Stable bibasilar atelectasis and/or small bilateral pleural effusions. No pneumothorax is seen. IMPRESSION: Stable chest x-ray. Stable cardiomegaly. Stable bibasilar atelectasis and/or small bilateral pleural effusions. Electronically Signed   By: 05/31/19 M.D.   On: 05/28/2019 07:13    Cardiac Studies      Patient Profile     69 y.o. male with CAD , COPD , Afib   Assessment & Plan    1.  Coronary artery disease: He status post coronary artery bypass grafting.  He seems to be making good progress.  He has been started on Lasix 40 mg a day.  His left arm and hand are edematous but this is likely due to an infiltrated IV.  His right hand does not have any edema.  He only has trace edema in his legs.  Blood pressure seems to be stable.  It looks like he will tolerate the Lasix 40 mg a day.  2.  Orthostatic hypotension: Continue midodrine.  3.  Atrial fibrillation: Continueamio.  Continue  metoprolol for rate control.  Continue digoxin at low-dose.  Restart Eliquis when ok from a surgical standpoint .     For questions or updates, please contact [facility] Please consult [email or web address] for contact info under        Signed, 05/27/2019, MD  05/29/2019, 9:43 AM

## 2019-05-29 NOTE — Progress Notes (Signed)
Plan of care reviewed. Pt 's been progressing. He's hemodynamically stable, Atrial fibrillation on monitor, HR 60s-80s, BP within normal limits. SPO2 88-92% titrated O2 NCL to 5 LPM to keep SPO2 > 92-94%. Auscultated diminished breath sound basilar bilaterally. Encouraged using incentive spirometer and flutter vulve, needed reinforcement for breathing excercise. He continue to have productive cough with copious yellowish thin sputum.   Pacer wires protected and rolled with tape. Sternal wound dry and cleaned with Betadine, no drainage. Pain tolerated well, Morphine and Tylenol given PRN. No immediate distress noted tonight. We will continue to monitor.  Filiberto Pinks, RN

## 2019-05-29 NOTE — Plan of Care (Signed)
°  Problem: Education: °Goal: Knowledge of General Education information will improve °Description: Including pain rating scale, medication(s)/side effects and non-pharmacologic comfort measures °Outcome: Not Progressing °  °Problem: Health Behavior/Discharge Planning: °Goal: Ability to manage health-related needs will improve °Outcome: Not Progressing °  °Problem: Clinical Measurements: °Goal: Ability to maintain clinical measurements within normal limits will improve °Outcome: Not Progressing °Goal: Will remain free from infection °Outcome: Not Progressing °Goal: Diagnostic test results will improve °Outcome: Not Progressing °Goal: Respiratory complications will improve °Outcome: Not Progressing °Goal: Cardiovascular complication will be avoided °Outcome: Not Progressing °  °Problem: Activity: °Goal: Risk for activity intolerance will decrease °Outcome: Not Progressing °  °Problem: Nutrition: °Goal: Adequate nutrition will be maintained °Outcome: Not Progressing °  °Problem: Coping: °Goal: Level of anxiety will decrease °Outcome: Not Progressing °  °Problem: Elimination: °Goal: Will not experience complications related to bowel motility °Outcome: Not Progressing °Goal: Will not experience complications related to urinary retention °Outcome: Not Progressing °  °Problem: Pain Managment: °Goal: General experience of comfort will improve °Outcome: Not Progressing °  °Problem: Safety: °Goal: Ability to remain free from injury will improve °Outcome: Not Progressing °  °Problem: Skin Integrity: °Goal: Risk for impaired skin integrity will decrease °Outcome: Not Progressing °  °Problem: Education: °Goal: Will demonstrate proper wound care and an understanding of methods to prevent future damage °Outcome: Not Progressing °Goal: Knowledge of disease or condition will improve °Outcome: Not Progressing °Goal: Knowledge of the prescribed therapeutic regimen will improve °Outcome: Not Progressing °Goal: Individualized  Educational Video(s) °Outcome: Not Progressing °  °Problem: Activity: °Goal: Risk for activity intolerance will decrease °Outcome: Not Progressing °  °Problem: Cardiac: °Goal: Will achieve and/or maintain hemodynamic stability °Outcome: Not Progressing °  °Problem: Clinical Measurements: °Goal: Postoperative complications will be avoided or minimized °Outcome: Not Progressing °  °Problem: Respiratory: °Goal: Respiratory status will improve °Outcome: Not Progressing °  °Problem: Skin Integrity: °Goal: Wound healing without signs and symptoms of infection °Outcome: Not Progressing °Goal: Risk for impaired skin integrity will decrease °Outcome: Not Progressing °  °Problem: Urinary Elimination: °Goal: Ability to achieve and maintain adequate renal perfusion and functioning will improve °Outcome: Not Progressing °  °

## 2019-05-29 NOTE — Progress Notes (Signed)
CARDIAC REHAB PHASE I   PRE:  Rate/Rhythm: 81 afib  BP:  Supine:   Sitting: 125/91  Standing:    SaO2: 93% 5L  MODE:  Ambulation: 200 ft   POST:  Rate/Rhythm: 96 afib  BP:  Supine:   Sitting: 146/70  Standing:    SaO2: 90% 6L hall and room [phone] Pt has been coughing up productively and receptive to walking to help with pulmonary rehab.. Pt walked 200 ft on 6L with rolling walker and asst x 1. Pt stopped several times due to legs burning, eventually, I had to put in wheelchair for pt to rest his legs, and then wheel back to room. Checked sats on 6L and 90%. Put to bed for pacing wire removal. Back to 5L. Has IS and flutter valve by bedside.   Luetta Nutting, RN BSN  05/29/2019 9:52 AM

## 2019-05-29 NOTE — Progress Notes (Signed)
Mobility Specialist - Progress Note   05/29/19 1504  Mobility  Activity Ambulated in hall  Level of Assistance Modified independent, requires aide device or extra time  Distance Ambulated (ft) 320 ft  Mobility Response Tolerated fair  Mobility performed by Mobility specialist  $Mobility charge 1 Mobility    Pre-mobility: 82 HR, 122/72 BP, 94% SpO2 During mobility: 88%SpO2 Post-mobility: 94 HR, 138/107 BP, 88% SPO2  Pt said he was experiencing the same pain as he did during his walks earlier, but said it was worse in severity and reach this time. He described the pain as "pins and needles" and that it was radiating all the way up his leg to his buttocks. He stated it only went up to his knees while walking this morning, he rated the present pain a 7/10. Pt needed to sit down and rest halfway through his walk. I left him sitting up in his chair with his call alarm and his phone within reach. He expressed understanding of his sternal precautions.   Mamie Levers Mobility Specialist

## 2019-05-29 NOTE — Progress Notes (Addendum)
      [address]       [place] [address]             [phone]        6 Days Post-Op Procedure(s) (LRB): CORONARY ARTERY BYPASS GRAFTING (CABG), times three, using right greater saphenous vein and left internal mammary (N/A) CLIPPING OF ATRIAL APPENDAGE with atriclip (N/A) TRANSESOPHAGEAL ECHOCARDIOGRAM (TEE) (N/A)  Subjective: Patient sitting in chair this am. He states his breathing is ok. He states when he walks "he gets pins and needles in his feet, legs, up into his thighs.  Objective: Vital signs in last 24 hours: Temp:  [97.4 F (36.3 C)-97.9 F (36.6 C)] 97.7 F (36.5 C) (05/17 0400) Pulse Rate:  [42-104] 73 (05/17 0500) Cardiac Rhythm: Atrial fibrillation (05/17 0438) Resp:  [14-23] 18 (05/17 0500) BP: (90-139)/(51-98) 99/63 (05/17 0400) SpO2:  [90 %-98 %] 94 % (05/17 0500)  Pre op weight 105.4 kg Current Weight  05/28/19 109.3 kg      Intake/Output from previous day: 05/16 0701 - 05/17 0700 In: 930 [P.O.:730; IV Piggyback:200] Out: 2330 [Urine:2330]   Physical Exam:  Cardiovascular: [facility] Pulmonary: Diminished bibasilar breath souds Abdomen: Soft, non tender, bowel sounds present. Extremities: Mild bilateral lower extremity edema. Wounds: Clean and dry.  No erythema or signs of infection.  Lab Results: CBC: Recent Labs    05/28/19 0203 05/29/19 0238  WBC 9.4 8.9  HGB 12.2* 11.0*  HCT 36.8* 32.6*  PLT 142* 173   BMET:  Recent Labs    05/28/19 0203 05/29/19 0238  NA 138 135  K 4.3 3.7  CL 99 99  CO2 29 28  GLUCOSE 94 92  BUN 17 22  CREATININE 1.28* 1.18  CALCIUM 8.3* 8.2*    PT/INR:  Lab Results  Component Value Date   INR 1.3 (H) 05/23/2019   INR 1.0 05/21/2019   ABG:  INR: Will add last result for INR, ABG once components are confirmed Will add last 4 CBG results once components are confirmed  Assessment/Plan:  1. CV - A fib, first degree heart block. On Amiodarone 200 mg bid and Midodrine 10  mg tid. Will need to restart Apixaban. 2.  Pulmonary - On 5 liters of oxygen via [place]. Wean as able. Continue Mucinex and Anoro Ellipta. Encourage incentive spirometer 3. Volume Overload - On Midodrine for BP which is improving so will discuss diuresis with Dr. Laneta Simmers. 4.  Expected post op acute blood loss anemia - H and H this am slightly decreased to 11 and 32.6 5. CBGs 95/89/78. Pre op HGA1C 6.3. 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 Stop accu checks and SS PRN. Will need medical follow up after discharge and will include nutrition information with discharge paperwork. 6. Supplement potassium 7. ID-on Cefepime. Gram stain of sputum showed MODERATE GRAM NEGATIVE COCCOBACILLI and RARE GRAM POSITIVE COCCI. Culture pending. 8. Hypothyroidism-continue Levothyroxine 75 mcg daily 9. Likely has neuropathy, as described in his complaints this am. Will  start low dose Neurontin. 10. Remove EPW  Donielle M ZimmermanPA-C 05/29/2019,6:55 AM   Chart reviewed, patient examined, agree with above. Wt is about 8 lbs over preop and he has small pleural effusions on CXR. Will continue diuresis.  Remains in AF. PW out today and resume Eliquis tomorrow. Antibiotic switched to Ceftriaxone base on culture.

## 2019-05-29 NOTE — Progress Notes (Signed)
Ambulated in hall way 235 feet with walker, on 5LPM of O2 NCL, SPO2 88% with complaining of SOB. Tolerated fair. Will monitor.  Filiberto Pinks, RN

## 2019-05-30 LAB — BASIC METABOLIC PANEL
Anion gap: 11 (ref 5–15)
BUN: 19 mg/dL (ref 8–23)
CO2: 33 mmol/L — ABNORMAL HIGH (ref 22–32)
Calcium: 8.6 mg/dL — ABNORMAL LOW (ref 8.9–10.3)
Chloride: 92 mmol/L — ABNORMAL LOW (ref 98–111)
Creatinine, Ser: 1.38 mg/dL — ABNORMAL HIGH (ref 0.61–1.24)
GFR calc Af Amer: >60 mL/min (ref >60)
GFR calc non Af Amer: 53 mL/min — ABNORMAL LOW (ref >60)
Glucose, Bld: 96 mg/dL (ref 70–99)
Potassium: 3.7 mmol/L (ref 3.5–5.1)
Sodium: 136 mmol/L (ref 135–145)

## 2019-05-30 MED ORDER — APIXABAN 5 MG PO TABS
5.0000 mg | ORAL_TABLET | Freq: Two times a day (BID) | ORAL | Status: DC
  Administered 2019-05-30 – 2019-06-01 (×5): 5 mg via ORAL
  Filled 2019-05-30 (×5): qty 1

## 2019-05-30 NOTE — Progress Notes (Addendum)
[address]       [place] [address]             [phone]      7 Days Post-Op Procedure(s) (LRB): CORONARY ARTERY BYPASS GRAFTING (CABG), times three, using right greater saphenous vein and left internal mammary (N/A) CLIPPING OF ATRIAL APPENDAGE with atriclip (N/A) TRANSESOPHAGEAL ECHOCARDIOGRAM (TEE) (N/A) Subjective: Says he is continuing to feel better + prod sputum, current 5 liters O2   Objective: Vital signs in last 24 hours: Temp:  [97.6 F (36.4 C)-97.9 F (36.6 C)] 97.6 F (36.4 C) (05/18 0401) Pulse Rate:  [64-93] 64 (05/18 0401) Cardiac Rhythm: Atrial fibrillation (05/18 0410) Resp:  [15-21] 17 (05/18 0401) BP: (109-130)/(50-92) 114/63 (05/18 0401) SpO2:  [90 %-97 %] 96 % (05/18 0401) Weight:  [107.8 kg] 107.8 kg (05/18 0500)  Hemodynamic parameters for last 24 hours:    Intake/Output from previous day: 05/17 0701 - 05/18 0700 In: 118 [P.O.:118] Out: 3250 [Urine:3250] Intake/Output this shift: No intake/output data recorded.  General appearance: alert, cooperative and no distress Heart: irregularly irregular rhythm Lungs: fair air exchange throughout without ronchi or wheeze Abdomen: non tender, nondistender, hernia is stable without acute findings Extremities: + LE edema bilat Wound: incis healing well  Lab Results: Recent Labs    05/28/19 0203 05/29/19 0238  WBC 9.4 8.9  HGB 12.2* 11.0*  HCT 36.8* 32.6*  PLT 142* 173   BMET:  Recent Labs    05/29/19 0238 05/30/19 0337  NA 135 136  K 3.7 3.7  CL 99 92*  CO2 28 33*  GLUCOSE 92 96  BUN 22 19  CREATININE 1.18 1.38*  CALCIUM 8.2* 8.6*    PT/INR: No results for input(s): LABPROT, INR in the last 72 hours. ABG    Component Value Date/Time   PHART 7.347 (L) 05/24/2019 0957   HCO3 26.2 05/24/2019 0957   TCO2 28 05/24/2019 0957   ACIDBASEDEF 3.0 (H) 05/23/2019 1330   O2SAT 60.1 05/27/2019 0423   CBG (last 3)  Recent Labs    05/28/19 2107 05/29/19 0625  05/29/19 1717  GLUCAP 89 78 117*    Meds Scheduled Meds: . amiodarone  200 mg Oral BID PC  . aspirin EC  81 mg Oral Daily  . atorvastatin  80 mg Oral Daily  . bisacodyl  10 mg Oral Daily   Or  . bisacodyl  10 mg Rectal Daily  . budesonide  0.25 mg Inhalation BID  . Chlorhexidine Gluconate Cloth  6 each Topical Daily  . digoxin  0.125 mg Oral Daily  . docusate sodium  200 mg Oral Daily  . enoxaparin (LOVENOX) injection  40 mg Subcutaneous Q24H  . furosemide  40 mg Oral Daily  . gabapentin  200 mg Oral BID  . guaiFENesin  1,200 mg Oral BID  . levalbuterol  1.25 mg Nebulization BID  . levothyroxine  75 mcg Oral Q0600  . mouth rinse  15 mL Mouth Rinse BID  . metolazone  2.5 mg Oral Daily  . midodrine  10 mg Oral TID WC  . montelukast  10 mg Oral BID  . multivitamin with minerals  1 tablet Oral Daily  . omega-3 acid ethyl esters  1 g Oral BID  . pantoprazole  40 mg Oral Daily  . polyvinyl alcohol  1 drop Both Eyes QHS  . potassium chloride  20 mEq Oral BID  . sodium chloride flush  10-40 mL Intracatheter Q12H  .  sodium chloride flush  3 mL Intravenous Q12H  . umeclidinium-vilanterol  1 puff Inhalation Daily   Continuous Infusions: . sodium chloride 250 mL (05/28/19 2352)  . cefTRIAXone (ROCEPHIN)  IV 2 g (05/29/19 1600)  . lactated ringers     PRN Meds:.ALPRAZolam, dextrose, morphine injection, ondansetron (ZOFRAN) IV, oxyCODONE, sodium chloride flush, sodium chloride flush, traMADol  Xrays No results found.  Results for orders placed or performed during the hospital encounter of 05/21/19  Respiratory Panel by RT PCR (Flu A&B, Covid) - Nasopharyngeal Swab     Status: None   Collection Time: 05/21/19  5:40 AM   Specimen: Nasopharyngeal Swab  Result Value Ref Range Status   SARS Coronavirus 2 by RT PCR NEGATIVE NEGATIVE Final    Comment: (NOTE) SARS-CoV-2 target nucleic acids are NOT DETECTED. The SARS-CoV-2 RNA is generally detectable in upper respiratoy specimens  during the acute phase of infection. The lowest concentration of SARS-CoV-2 viral copies this assay can detect is 131 copies/mL. A negative result does not preclude SARS-Cov-2 infection and should not be used as the sole basis for treatment or other patient management decisions. A negative result may occur with  improper specimen collection/handling, submission of specimen other than nasopharyngeal swab, presence of viral mutation(s) within the areas targeted by this assay, and inadequate number of viral copies (<131 copies/mL). A negative result must be combined with clinical observations, patient history, and epidemiological information. The expected result is Negative. Fact Sheet for Patients:  PinkCheek.be Fact Sheet for Healthcare Providers:  GravelBags.it This test is not yet ap proved or cleared by the [place] FDA and  has been authorized for detection and/or diagnosis of SARS-CoV-2 by FDA under an Emergency Use Authorization (EUA). This EUA will remain  in effect (meaning this test can be used) for the duration of the COVID-19 declaration under Section 564(b)(1) of the Act, 21 U.S.C. section 360bbb-3(b)(1), unless the authorization is terminated or revoked sooner.    Influenza A by PCR NEGATIVE NEGATIVE Final   Influenza B by PCR NEGATIVE NEGATIVE Final    Comment: (NOTE) The Xpert Xpress SARS-CoV-2/FLU/RSV assay is intended as an aid in  the diagnosis of influenza from Nasopharyngeal swab specimens and  should not be used as a sole basis for treatment. Nasal washings and  aspirates are unacceptable for Xpert Xpress SARS-CoV-2/FLU/RSV  testing. Fact Sheet for Patients: PinkCheek.be Fact Sheet for Healthcare Providers: GravelBags.it This test is not yet approved or cleared by the [place] FDA and  has been authorized for detection and/or diagnosis of  SARS-CoV-2 by  FDA under an Emergency Use Authorization (EUA). This EUA will remain  in effect (meaning this test can be used) for the duration of the  Covid-19 declaration under Section 564(b)(1) of the Act, 21  U.S.C. section 360bbb-3(b)(1), unless the authorization is  terminated or revoked. Performed at [facility], [place] [address], [place], [place] [address]   Surgical pcr screen     Status: None   Collection Time: 05/23/19  2:11 AM   Specimen: Nasal Mucosa; Nasal Swab  Result Value Ref Range Status   MRSA, PCR NEGATIVE NEGATIVE Final   Staphylococcus aureus NEGATIVE NEGATIVE Final    Comment: (NOTE) The Xpert SA Assay (FDA approved for NASAL specimens in patients 62 years of age and older), is one component of a comprehensive surveillance program. It is not intended to diagnose infection nor to guide or monitor treatment. Performed at [facility], [place],  [place] [address]   Expectorated sputum assessment w rflx to resp cult     Status: None   Collection Time: 05/28/19 12:30 PM   Specimen: Expectorated Sputum  Result Value Ref Range Status   Specimen Description EXPECTORATED SPUTUM  Final   Special Requests NONE  Final   Sputum evaluation   Final    THIS SPECIMEN IS ACCEPTABLE FOR SPUTUM CULTURE Performed at [facility], [address] [place], [place] [address]    Report Status 05/28/2019 FINAL  Final  Culture, respiratory     Status: None   Collection Time: 05/28/19 12:30 PM  Result Value Ref Range Status   Specimen Description EXPECTORATED SPUTUM  Final   Special Requests NONE Reflexed from [id number]  Final   Gram Stain   Final    RARE WBC PRESENT,BOTH PMN AND MONONUCLEAR MODERATE GRAM NEGATIVE COCCOBACILLI RARE GRAM POSITIVE COCCI IN PAIRS    Culture   Final    MODERATE HAEMOPHILUS INFLUENZAE BETA LACTAMASE NEGATIVE Performed at [facility], [address] [place], [place] [address]    Report Status 05/29/2019 FINAL   Final      Assessment/Plan: S/P Procedure(s) (LRB): CORONARY ARTERY BYPASS GRAFTING (CABG), times three, using right greater saphenous vein and left internal mammary (N/A) CLIPPING OF ATRIAL APPENDAGE with atriclip (N/A) TRANSESOPHAGEAL ECHOCARDIOGRAM (TEE) (N/A)   1 steady overall clinical improvement 2 afib with some PVC's rate controlled, resume eliquis 3 volume overload, diuresing well- cont current lasix/ dosing, will stop metolazone with creat bump to 1.38 4 wean O2, cont pulm RX/pulm toilet 5 sputum cx- H. Flu, beta lactamase neg- rocephin is good coverage- cont 6 blood sugars controlled- out patient management for prediabetes 7 routine rehab  LOS: 9 days    Rowe Clack PA-C Pager [phone] 05/30/2019   Chart reviewed, patient examined, agree with above. He is feeling better.  Had BM today. Continuing to use IS and flutter hourly and bringing up sputum. Diuresing and wt coming down. Hopefully home in a couple days. May need oxygen at home. He could continue oral antibiotic at home.

## 2019-05-30 NOTE — Plan of Care (Signed)
°  Problem: Education: °Goal: Knowledge of General Education information will improve °Description: Including pain rating scale, medication(s)/side effects and non-pharmacologic comfort measures °Outcome: Not Progressing °  °Problem: Health Behavior/Discharge Planning: °Goal: Ability to manage health-related needs will improve °Outcome: Not Progressing °  °Problem: Clinical Measurements: °Goal: Ability to maintain clinical measurements within normal limits will improve °Outcome: Not Progressing °Goal: Will remain free from infection °Outcome: Not Progressing °Goal: Diagnostic test results will improve °Outcome: Not Progressing °Goal: Respiratory complications will improve °Outcome: Not Progressing °Goal: Cardiovascular complication will be avoided °Outcome: Not Progressing °  °Problem: Activity: °Goal: Risk for activity intolerance will decrease °Outcome: Not Progressing °  °Problem: Nutrition: °Goal: Adequate nutrition will be maintained °Outcome: Not Progressing °  °Problem: Coping: °Goal: Level of anxiety will decrease °Outcome: Not Progressing °  °Problem: Elimination: °Goal: Will not experience complications related to bowel motility °Outcome: Not Progressing °Goal: Will not experience complications related to urinary retention °Outcome: Not Progressing °  °Problem: Pain Managment: °Goal: General experience of comfort will improve °Outcome: Not Progressing °  °Problem: Safety: °Goal: Ability to remain free from injury will improve °Outcome: Not Progressing °  °Problem: Skin Integrity: °Goal: Risk for impaired skin integrity will decrease °Outcome: Not Progressing °  °Problem: Education: °Goal: Will demonstrate proper wound care and an understanding of methods to prevent future damage °Outcome: Not Progressing °Goal: Knowledge of disease or condition will improve °Outcome: Not Progressing °Goal: Knowledge of the prescribed therapeutic regimen will improve °Outcome: Not Progressing °Goal: Individualized  Educational Video(s) °Outcome: Not Progressing °  °Problem: Activity: °Goal: Risk for activity intolerance will decrease °Outcome: Not Progressing °  °Problem: Cardiac: °Goal: Will achieve and/or maintain hemodynamic stability °Outcome: Not Progressing °  °Problem: Clinical Measurements: °Goal: Postoperative complications will be avoided or minimized °Outcome: Not Progressing °  °Problem: Respiratory: °Goal: Respiratory status will improve °Outcome: Not Progressing °  °Problem: Skin Integrity: °Goal: Wound healing without signs and symptoms of infection °Outcome: Not Progressing °Goal: Risk for impaired skin integrity will decrease °Outcome: Not Progressing °  °Problem: Urinary Elimination: °Goal: Ability to achieve and maintain adequate renal perfusion and functioning will improve °Outcome: Not Progressing °  °

## 2019-05-30 NOTE — Progress Notes (Signed)
CARDIAC REHAB PHASE I   PRE:  Rate/Rhythm: 78 afib  BP:  Supine:   Sitting: 130/64  Standing:    SaO2: 92% 5L  MODE:  Ambulation: 370 ft   POST:  Rate/Rhythm: 106 afib  BP:  Supine:   Sitting: 144/69  Standing:    SaO2: 88% 4L  91% 6L 1427-1500 Monitored forehead to see if sats better on forehead but registered about the same. Pt walked 370 ft on 4L and increased at 190 ft to 6L to keep sats up. Pt sat once to rest legs and catch his breath. Used rollator so pt could sit as needed. DOE noted. To recliner after walk and back to 5L in room. Pt receptive to walking and using IS and flutter valve.   Luetta Nutting, RN BSN  05/30/2019 2:55 PM

## 2019-05-31 LAB — BASIC METABOLIC PANEL
Anion gap: 9 (ref 5–15)
BUN: 20 mg/dL (ref 8–23)
CO2: 34 mmol/L — ABNORMAL HIGH (ref 22–32)
Calcium: 8.5 mg/dL — ABNORMAL LOW (ref 8.9–10.3)
Chloride: 92 mmol/L — ABNORMAL LOW (ref 98–111)
Creatinine, Ser: 1.22 mg/dL (ref 0.61–1.24)
GFR calc Af Amer: >60 mL/min (ref >60)
GFR calc non Af Amer: >60 mL/min (ref >60)
Glucose, Bld: 111 mg/dL — ABNORMAL HIGH (ref 70–99)
Potassium: 3.7 mmol/L (ref 3.5–5.1)
Sodium: 135 mmol/L (ref 135–145)

## 2019-05-31 MED ORDER — POTASSIUM CHLORIDE CRYS ER 20 MEQ PO TBCR
20.0000 meq | EXTENDED_RELEASE_TABLET | Freq: Three times a day (TID) | ORAL | Status: AC
  Administered 2019-05-31 (×3): 20 meq via ORAL
  Filled 2019-05-31 (×3): qty 1

## 2019-05-31 MED ORDER — MIDODRINE HCL 5 MG PO TABS
5.0000 mg | ORAL_TABLET | Freq: Three times a day (TID) | ORAL | Status: DC
  Administered 2019-05-31 – 2019-06-01 (×5): 5 mg via ORAL
  Filled 2019-05-31 (×5): qty 1

## 2019-05-31 NOTE — TOC Initial Note (Signed)
Transition of Care (TOC) - Initial/Assessment Note  Donn Pierini RN, BSN Transitions of Care Unit 4E- RN Case Manager [phone]    Patient Details  Name: Max Rivas MRN: [id number] Date of Birth: Jun 03, 1950  Transition of Care [facility]) CM/SW Contact:    Darrold Span, RN Phone Number: 05/31/2019, 2:09 PM  Clinical Narrative:                 Pt seen for transition of care needs s/p CABG. Orders placed for [facility] and DME needs- rollator and home 02. CM to the bedside to discuss with pt transition plan. List provided to pt for [facility] choice- Per CMS guidelines from medicare.gov website with star ratings (copy placed in shadow chart)- per pt he has both VA benefits and UHC medicare. - he would like to use VA for [facility] needs if able and is agreeable to use Medicare for his DME needs. Confirmed pt's PCP is Anson Fret at the [facility]- pt states he has his med needs set up for discharge [facility] then will go to [place] to get the rest of his meds. CM will fax his d/c summary with meds listed to his PCP clinic once available (fax # [phone]).  Per pt choice- will try to use [facility] as first choice to contract with the [facility] if able, second choice would be [facility] through his [facility] Medicare.   Call made to [facility] with Chip Boer for [facility] referral using pt's VA benefits- drew will f/u with his VA team to see if they can get VA contract for [facility] needs.   Call made to [facility] with [facility]- for DME needs- rollator to be delivered to the room prior to discharge- home 02 if pt qualifies will be arranged with [facility] as well- portable tank to be delivered to room once insurance verified and pt meets qualifications.     Expected Discharge Plan: Home w Home Health Services Barriers to Discharge: Continued Medical Work up   Patient Goals and CMS Choice Patient states their goals for this hospitalization and ongoing recovery are:: return home CMS Medicare.gov Compare Post Acute Care list  provided to:: Patient Choice offered to / list presented to : Patient  Expected Discharge Plan and Services Expected Discharge Plan: Home w Home Health Services   Discharge Planning Services: CM Consult Post Acute Care Choice: Durable Medical Equipment, Home Health Living arrangements for the past 2 months: Single Family Home                 DME Arranged: Oxygen, Walker rolling with seat DME Agency: [facility] Date DME Agency Contacted: 05/31/19 Time DME Agency Contacted: 1408 Representative spoke with at DME Agency: Ian Malkin HH Arranged: RN HH Agency: [facility] Date [facility] Agency Contacted: 05/31/19 Time HH Agency Contacted: 1255 Representative spoke with at [facility] Agency: Kenard Gower  Prior Living Arrangements/Services Living arrangements for the past 2 months: Single Family Home Lives with:: Self Patient language and need for interpreter reviewed:: Yes Do you feel safe going back to the place where you live?: Yes      Need for Family Participation in Patient Care: Yes (Comment) Care giver support system in place?: Yes (comment)   Criminal Activity/Legal Involvement Pertinent to Current Situation/Hospitalization: No - Comment as needed  Activities of Daily Living Home Assistive Devices/Equipment: None ADL Screening (condition at time of admission) Patient's cognitive ability adequate to safely complete daily activities?: Yes Is the patient deaf or have difficulty hearing?: No  Does the patient have difficulty seeing, even when wearing glasses/contacts?: No Does the patient have difficulty concentrating, remembering, or making decisions?: No Patient able to express need for assistance with ADLs?: Yes Does the patient have difficulty dressing or bathing?: No Independently performs ADLs?: Yes (appropriate for developmental age) Does the patient have difficulty walking or climbing stairs?: No Weakness of Legs: None Weakness of Arms/Hands: None  Permission  Sought/Granted Permission sought to share information with : Chartered certified accountant granted to share information with : Yes, Verbal Permission Granted     Permission granted to share info w AGENCY: HH, DME and VA agencies        Emotional Assessment Appearance:: Appears stated age Attitude/Demeanor/Rapport: Engaged Affect (typically observed): Appropriate, Pleasant Orientation: : Oriented to Self, Oriented to Place, Oriented to  Time, Oriented to Situation Alcohol / Substance Use: Not Applicable Psych Involvement: No (comment)  Admission diagnosis:  Unstable angina ([place]) [I20.0] Patient Active Problem List   Diagnosis Date Noted  . Unstable angina ([place]) 05/21/2019  . Ventricular tachycardia ([place]) 05/12/2019  . Chest pain   . VT (ventricular tachycardia) ([place]) 05/11/2019  . Arrhythmia 03/12/2019  . On home O2   . Chronic combined systolic and diastolic CHF (congestive heart failure) ([place]) 06/22/2018  . Chronic respiratory failure with hypoxia ([place]) 06/22/2018  . Congestive heart failure (CHF) ([place]) 06/22/2018  . DCM (dilated cardiomyopathy) ([place]) 05/25/2017  . Coronary artery disease 05/25/2017  . Chronic atrial fibrillation 05/25/2017  . COPD (chronic obstructive pulmonary disease) ([place]) 05/25/2017  . CKD (chronic kidney disease) 05/25/2017  . Hypothyroidism 05/25/2017  . 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 OSA (obstructive sleep apnea) 05/25/2017  . Pulmonary HTN ([place]) 05/25/2017  . Acute on chronic respiratory failure with hypoxia ([place])   . Mediastinal adenopathy   . Cervical adenopathy   . Acute respiratory failure with hypoxia ([place]) 02/24/2016  . Pulmonary embolus ([place]) 02/24/2016  . 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 Pleural effusion   . Hypoxemia   . Lung mass   . Pleural plaque   . Flutter-fibrillation ([place]) 01/15/2016  . Acute systolic congestive heart failure ([place])   . Atrial fibrillation with RVR ([place])    . Hypercholesterolemia 03/11/2006  . Tobacco abuse 03/11/2006  . Acute on chronic systolic congestive heart failure ([place]) 03/11/2006  . GASTROESOPHAGEAL REFLUX, NO ESOPHAGITIS 03/11/2006  . OSTEOARTHRITIS, MULTI SITES 03/11/2006  . HIGH RISK PATIENT 03/11/2006  . Tobacco dependence 03/11/2006   PCP:  [name], MD Pharmacy:   [id number] [place], [address] [place] [address] Phone: [phone] Fax: [phone]  [place], [place] - [place] [address] [place] [address] Phone: [phone] Fax: [phone]     Social Determinants of Health (SDOH) Interventions    Readmission Risk Interventions No flowsheet data found.

## 2019-05-31 NOTE — Progress Notes (Signed)
CARDIAC REHAB PHASE I   PRE:  Rate/Rhythm: 74 afib  BP:  Supine:   Sitting: 126/63  Standing:    SaO2: 87%RA  Was 90% on 4L  MODE:  Ambulation: 400 ft   POST:  Rate/Rhythm: 104 afib PVCs  BP:  Supine:   Sitting: 145/61  Standing:    SaO2: 86% 2L-  87% 3L   89-90% 4L 1008-1045 See qualifying note for oxygen. Pt needed 4L and frequent rest stops to keep sats at 89-90%. Pt walked 400 ft with rolling walker stopping frequently to rest his legs and to catch his breath.  Would recommend rolling walker and home oxygen for discharge. Pt has been using IS and flutter valve. To recliner after walk. Kept on 4L. Discussed smoking cessation and pt stated he has quit. Has already told roommates to smoke outside and has ordered filters for home.   Luetta Nutting, RN BSN  05/31/2019 10:39 AM

## 2019-05-31 NOTE — Progress Notes (Signed)
Mobility Specialist - Progress Note   05/31/19 1654  Mobility  Activity Ambulated in hall  Level of Assistance Modified independent, requires aide device or extra time  Assistive Device Front wheel walker  Distance Ambulated (ft) 330 ft  Mobility Response Tolerated well  Mobility performed by Mobility specialist  $Mobility charge 1 Mobility    Pre-mobility: 74 HR, 95% SpO2 During mobility: 83 HR, 91% SpO2 Post-mobility: 91 HR, 89% SPO2  Pt was ambulated on 4 L/min of O2, he had to stop for a standing rest break once due to SOB. After sitting, his oxygen rose from ~87 to the low 90s. Pt c/o the same "pins and needles" like pain while walking. He says he has been struggling with a cough that has been causing him pain around his incision site. I left him sitting in his chair with his call alarm and phone within reach.  Mamie Levers Mobility Specialist

## 2019-05-31 NOTE — Progress Notes (Addendum)
SATURATION QUALIFICATIONS: (This note is used to comply with regulatory documentation for home oxygen)  Patient Saturations on Room Air at Rest = 87%  Patient Saturations on Room Air while Ambulating = 85%  Patient Saturations on 4 Liters of oxygen while Ambulating = 90%  Please briefly explain why patient needs home oxygen:  desat on RA

## 2019-05-31 NOTE — Progress Notes (Signed)
8 Days Post-Op Procedure(s) (LRB): CORONARY ARTERY BYPASS GRAFTING (CABG), times three, using right greater saphenous vein and left internal mammary (N/A) CLIPPING OF ATRIAL APPENDAGE with atriclip (N/A) TRANSESOPHAGEAL ECHOCARDIOGRAM (TEE) (N/A) Subjective: Continues to feel well. Had BM yesterday and today. Still on 3L oxygen with sats 97% this am.   Objective: Vital signs in last 24 hours: Temp:  [97.6 F (36.4 C)-98 F (36.7 C)] 97.6 F (36.4 C) (05/19 0803) Pulse Rate:  [64-82] 76 (05/19 0803) Cardiac Rhythm: Atrial fibrillation (05/19 0825) Resp:  [16-21] 18 (05/19 0803) BP: (107-126)/(57-92) 107/64 (05/19 0803) SpO2:  [90 %-98 %] 97 % (05/19 0803) Weight:  [107 kg] 107 kg (05/19 0500)  Hemodynamic parameters for last 24 hours:    Intake/Output from previous day: 05/18 0701 - 05/19 0700 In: 769.4 [P.O.:550; IV Piggyback:219.4] Out: 2000 [Urine:2000] Intake/Output this shift: Total I/O In: 100 [P.O.:100] Out: -   General appearance: alert and cooperative Neurologic: intact Heart: irregular rate and rhythm, S1, S2 normal, no murmur Lungs: diminished breath sounds bibasilar Extremities: no edema Wound: incisions ok  Lab Results: Recent Labs    05/29/19 0238  WBC 8.9  HGB 11.0*  HCT 32.6*  PLT 173   BMET:  Recent Labs    05/30/19 0337 05/31/19 0309  NA 136 135  K 3.7 3.7  CL 92* 92*  CO2 33* 34*  GLUCOSE 96 111*  BUN 19 20  CREATININE 1.38* 1.22  CALCIUM 8.6* 8.5*    PT/INR: No results for input(s): LABPROT, INR in the last 72 hours. ABG    Component Value Date/Time   PHART 7.347 (L) 05/24/2019 0957   HCO3 26.2 05/24/2019 0957   TCO2 28 05/24/2019 0957   ACIDBASEDEF 3.0 (H) 05/23/2019 1330   O2SAT 60.1 05/27/2019 0423   CBG (last 3)  Recent Labs    05/28/19 2107 05/29/19 0625 05/29/19 1717  GLUCAP 89 78 117*    Assessment/Plan: S/P Procedure(s) (LRB): CORONARY ARTERY BYPASS GRAFTING (CABG), times three, using right greater saphenous  vein and left internal mammary (N/A) CLIPPING OF ATRIAL APPENDAGE with atriclip (N/A) TRANSESOPHAGEAL ECHOCARDIOGRAM (TEE) (N/A)  POD 8  Hemodynamically stable. Will decrease midodrine to 5 tid.  Remains in rate controlled atrial fib on amio, digoxin, Eliquis. If this continues he can have DCCV at later date per cardiology. He had hx of PAF preop but infrequently per him.  Wt is at admission wt and he has contraction alkalosis. Will stop diuretic and replace K+.  Smoking and COPD: assess for home oxygen. Will likely need this for a while.  On Ceftriaxone for H. influ in sputum. Will send home on Augmentin for a week.  Plan home tomorrow.   LOS: 10 days    Max Rivas 05/31/2019

## 2019-06-01 LAB — DIGOXIN LEVEL: Digoxin Level: 0.6 ng/mL — ABNORMAL LOW (ref 0.8–2.0)

## 2019-06-01 MED ORDER — DM-GUAIFENESIN ER 30-600 MG PO TB12
1.0000 | ORAL_TABLET | Freq: Two times a day (BID) | ORAL | Status: DC | PRN
Start: 2019-06-01 — End: 2019-09-22

## 2019-06-01 MED ORDER — DIGOXIN 125 MCG PO TABS: 0.1250 mg | ORAL_TABLET | Freq: Every day | ORAL | 0 refills | Status: DC

## 2019-06-01 MED ORDER — AMIODARONE HCL 200 MG PO TABS: 200.0000 mg | ORAL_TABLET | Freq: Two times a day (BID) | ORAL | 0 refills | Status: DC

## 2019-06-01 MED ORDER — MIDODRINE HCL 5 MG PO TABS: 5.0000 mg | ORAL_TABLET | Freq: Three times a day (TID) | ORAL | 1 refills | Status: DC

## 2019-06-01 MED ORDER — GABAPENTIN 100 MG PO CAPS: 200.0000 mg | ORAL_CAPSULE | Freq: Two times a day (BID) | ORAL | 1 refills | Status: AC

## 2019-06-01 MED ORDER — TRAMADOL HCL 50 MG PO TABS: 50.0000 mg | ORAL_TABLET | ORAL | 0 refills | Status: DC | PRN

## 2019-06-01 MED ORDER — DIGOXIN 125 MCG PO TABS: 0.1250 mg | ORAL_TABLET | Freq: Every day | ORAL | 3 refills | Status: DC

## 2019-06-01 MED ORDER — GABAPENTIN 100 MG PO CAPS: 200.0000 mg | ORAL_CAPSULE | Freq: Two times a day (BID) | ORAL | 0 refills | Status: DC

## 2019-06-01 MED ORDER — AMOXICILLIN-POT CLAVULANATE 875-125 MG PO TABS
1.0000 | ORAL_TABLET | Freq: Two times a day (BID) | ORAL | 0 refills | Status: DC
Start: 2019-06-01 — End: 2019-06-14

## 2019-06-01 MED ORDER — AMIODARONE HCL 200 MG PO TABS: 200.0000 mg | ORAL_TABLET | Freq: Every day | ORAL | 1 refills | Status: DC

## 2019-06-01 MED ORDER — MIDODRINE HCL 5 MG PO TABS: 5.0000 mg | ORAL_TABLET | Freq: Three times a day (TID) | ORAL | 0 refills | Status: DC

## 2019-06-01 NOTE — Progress Notes (Signed)
Discharged instructions given to patient. Medications and wound care reviewed. All questions answered. Oxygen and rollator delivered to room. Pt d/c with paper prescriptions. IV removed, pt's girlfriend to escort him home.  Versie Starks, RN

## 2019-06-01 NOTE — Progress Notes (Signed)
[id number] Discussed sternal precautions and staying in the tube. Reviewed importance of IS and flutter valve. Discussed walking for ex, low carb and heart healthy food choices, and CRP 2. Referred to [place] CRP 2. Pt will have home oxygen and knows that it took 4L to keep sats up with walk yesterday. Discussed increasing distance slowly with his walking to conserve breath and due to burning in legs. Pt voiced understanding of ed. Luetta Nutting RN BSN 06/01/2019 8:24 AM

## 2019-06-01 NOTE — TOC Transition Note (Signed)
Transition of Care [facility]) - CM/SW Discharge Note   Patient Details  Name: Max Rivas MRN: [id number] Date of Birth: 03-May-1950  Transition of Care [facility]) CM/SW Contact:  Lockie Pares, RN Phone Number: 06/01/2019, 9:43 AM   Clinical Narrative:    Patient ready for discharge. [employer] contacted this morning regarding oxygen and rollator walker. They are bringing both to the bedside. Once they are delivered, the patient can discharge home. Chip Boer is the home health provider.   Final next level of care: Home w Home Health Services Barriers to Discharge: No Barriers Identified   Patient Goals and CMS Choice Patient states their goals for this hospitalization and ongoing recovery are:: return home CMS Medicare.gov Compare Post Acute Care list provided to:: Patient Choice offered to / list presented to : Patient  Discharge Placement             Home with home health and DME          Discharge Plan and Services   Discharge Planning Services: CM Consult Post Acute Care Choice: Durable Medical Equipment, Home Health          DME Arranged: Oxygen, Walker rolling with seat DME Agency: [facility] Date DME Agency Contacted: 05/31/19 Time DME Agency Contacted: 1408 Representative spoke with at DME Agency: Ian Malkin HH Arranged: RN HH Agency: [facility] Date [facility] Agency Contacted: 05/31/19 Time HH Agency Contacted: 1255 Representative spoke with at [facility] Agency: Kenard Gower  Social Determinants of Health (SDOH) Interventions     Readmission Risk Interventions No flowsheet data found.

## 2019-06-01 NOTE — Progress Notes (Addendum)
      [address]       [place] [address]             [phone]      9 Days Post-Op Procedure(s) (LRB): CORONARY ARTERY BYPASS GRAFTING (CABG), times three, using right greater saphenous vein and left internal mammary (N/A) CLIPPING OF ATRIAL APPENDAGE with atriclip (N/A) TRANSESOPHAGEAL ECHOCARDIOGRAM (TEE) (N/A)   Subjective:  Feeling good, states he is ready to go home.  He is coughing hard to get sputum up which is blood tinged.  Objective: Vital signs in last 24 hours: Temp:  [97.6 F (36.4 C)-98.6 F (37 C)] 97.9 F (36.6 C) (05/20 0309) Pulse Rate:  [71-84] 84 (05/20 0309) Cardiac Rhythm: Atrial fibrillation (05/19 2200) Resp:  [16-22] 19 (05/20 0309) BP: (98-129)/(61-74) 129/61 (05/20 0309) SpO2:  [85 %-97 %] 89 % (05/20 0309) Weight:  [809 kg] 107 kg (05/20 0309)  Intake/Output from previous day: 05/19 0701 - 05/20 0700 In: 883.6 [P.O.:800; I.V.:3; IV Piggyback:80.6] Out: 400 [Urine:400]  General appearance: alert, cooperative and no distress Heart: irregularly irregular rhythm Lungs: wheezes bilaterally Abdomen: soft, non-tender; bowel sounds normal; no masses,  no organomegaly Extremities: edema trace Wound: clean and dry  Lab Results: No results for input(s): WBC, HGB, HCT, PLT in the last 72 hours. BMET:  Recent Labs    05/30/19 0337 05/31/19 0309  NA 136 135  K 3.7 3.7  CL 92* 92*  CO2 33* 34*  GLUCOSE 96 111*  BUN 19 20  CREATININE 1.38* 1.22  CALCIUM 8.6* 8.5*    PT/INR: No results for input(s): LABPROT, INR in the last 72 hours. ABG    Component Value Date/Time   PHART 7.347 (L) 05/24/2019 0957   HCO3 26.2 05/24/2019 0957   TCO2 28 05/24/2019 0957   ACIDBASEDEF 3.0 (H) 05/23/2019 1330   O2SAT 60.1 05/27/2019 0423   CBG (last 3)  Recent Labs    05/29/19 1717  GLUCAP 117*    Assessment/Plan: S/P Procedure(s) (LRB): CORONARY ARTERY BYPASS GRAFTING (CABG), times three, using right greater saphenous vein and  left internal mammary (N/A) CLIPPING OF ATRIAL APPENDAGE with atriclip (N/A) TRANSESOPHAGEAL ECHOCARDIOGRAM (TEE) (N/A)  1. CV- rate controlled A. Fib- on Amiodarone, Digoxin, Midodrine 2. Pulm- severe COPD, down to 3L of oxygen, home use has been arranged has f/u with pulmnologist first week of June 3. Renal- creatinine stable, remains mildly edematous, no Lasix at this time 4. H. Flu from sputum- has completed Rocephin IV, will give Augmentin for 7 days 5. Dispo- patient stable, will d/c home today   LOS: 11 days    Lowella Dandy, PA-C  06/01/2019   Chart reviewed, patient examined, agree with above. He feels well. I think he can go home. He has mild edema in feet and takes lasix chronically as needed for this so he will monitor edema and wt at home and take as he did before.

## 2019-06-01 NOTE — Plan of Care (Signed)
  Problem: Clinical Measurements: Goal: Will remain free from infection Outcome: Progressing Goal: Diagnostic test results will improve Outcome: Progressing   

## 2019-06-01 NOTE — Care Management (Signed)
Discharge progress report and care management discharge note faxed to [facility] at [phone]

## 2019-06-05 ENCOUNTER — Telehealth: Payer: Self-pay

## 2019-06-05 NOTE — Telephone Encounter (Signed)
Max Rivas contacted the office requesting his Oxygen be delivered by another company.  Patient stated that he was unhappy with [employer] and would like for [employer] to supply his oxygen.  Faxed order to [employer] per patient's request.  Patient aware.

## 2019-06-06 ENCOUNTER — Other Ambulatory Visit: Payer: Self-pay

## 2019-06-06 ENCOUNTER — Ambulatory Visit (INDEPENDENT_AMBULATORY_CARE_PROVIDER_SITE_OTHER): Payer: Self-pay

## 2019-06-06 DIAGNOSIS — Z4802 Encounter for removal of sutures: Secondary | ICD-10-CM

## 2019-06-06 NOTE — Progress Notes (Signed)
Removed 3 sutures from chest tube incision sites, no signs of infection and patient tolerated well.

## 2019-06-07 NOTE — Progress Notes (Signed)
CARDIOLOGY OFFICE NOTE  Date:  06/14/2019    Max Rivas Date of Birth: 28-Mar-1950 Medical Record #[id number]  PCP:  Anson Fret, MD  Cardiologist:  Smith/Taylor   Chief Complaint  Patient presents with  . Hospitalization Follow-up    Seen for Dr. Anastasia Fiedler    History of Present Illness: Max Rivas is a 69 y.o. male who presents today for a follow up/post hospital visit. Seen for Dr. Katrinka Blazing and Dr. Ladona Ridgel.   He has a history of known CAD with prior multiple MIs and PCIs - last in in 2004 at [facility], CHF with an ICD in place, AF - on anticoagulation with Eliquis and was previously on amiodarone, HLD, OSA - intolerant of CPAP, and COPD.   Last seen in March of 2019 by Dr. Katrinka Blazing and Dr. Ladona Ridgel in June of 2019.   He has had 3 recent admissions - Presented in March with multiple recurrent shocks as well as chest pain. Placed back on amiodarone. Recurrent shock in early May - He underwent a left heart catheterization which showed in-stent restenosis of the circumflex stent, and this was thought to be the cause of his ischemic arrhythmias.There was LAD and DX disease as well.  TCTS was consulted to discuss plans for surgical revascularization. He was told to not drive for 6 months.   Initially the patient did not wish to proceed with surgical revascularization. He wanted a second opinion. He was continued on Amiodarone that had been recently resumed and had Ranexa 500 mg bid added by cardiology and ultimately discharged.  Was then re admitted on 05/09 as he had further chest pain. After further discussion, he then opted for coronary artery bypass grafting surgery. Pre operative carotid duplex US showed no significant internal carotid artery stenosis bilaterally. He underwent a CABG x 3 and LA clip on 05/23/2019 by Dr. Laneta Simmers.   Post op course was uneventful. He did have AF with RVR - digoxin was added. Discharged in AF. Placed back on Eliquis and  amiodarone. Had expected volume overload and blood loss anemia. Required antibiotics for H flu on sputum culture. Did require home oxygen. He was orthostatic and required Midodrine. Ranexa was stopped.   The patient does not have symptoms concerning for COVID-19 infection (fever, chills, cough, or new shortness of breath).   Comes in today. Here alone. Sister is downstairs. Remains on oxygen at 2 liters - he does not wish to be on this long term. He is not smoking. He has had swelling in his legs - has just had his Lasix restarted 2 days ago. Swelling is slowly improving with the restart of his Lasix. [facility]. Tells me that [facility] is going to [facility] out his ICD later this month. He says that the [place] knows what all is going on. He was shocked this past Sunday - did not pass out but felt flushed and was sweating - then it fired. The [facility] reached out to him yesterday about this. He has had audible alerts twice a day. He is not driving. Sees Dr. Jillyn Hidden tomorrow with the [facility] and [facility] Archie Patten (pulmonary) [facility]. He says he plans to continue to see [place] and Dr. Ladona Ridgel along with the [facility]. He knows he cannot drive. Remains on Midodrine. He feels like overall he is doing ok. Understands that he will have some shortness of breath.   Past Medical History:  Diagnosis Date  . Arthritis    "  elbows, knees" (02/24/2016)  . Bronchitis 2006  . CHF (congestive heart failure) (HCC)   . COPD (chronic obstructive pulmonary disease) (HCC)   . Coronary artery disease   . GERD (gastroesophageal reflux disease)   . High cholesterol   . Myocardial infarction [facility]) ~ 1995 X 2;1998; 2000; 2004  . On home oxygen therapy    "2L w/activity" (02/24/2016)  . Pacemaker   . Pulmonary embolism (HCC) 02/24/2016  . Sleep apnea    "have CPAP; can't tolerate it" (02/24/2016)  . Unstable angina (HCC) 05/21/2019    Past Surgical History:  Procedure Laterality Date  . CLIPPING OF ATRIAL APPENDAGE N/A  05/23/2019   Procedure: CLIPPING OF ATRIAL APPENDAGE with atriclip;  Surgeon: [name], MD;  Location: [facility] OR;  Service: Open Heart Surgery;  Laterality: N/A;  . CORONARY ANGIOPLASTY  1995  . CORONARY ANGIOPLASTY WITH STENT PLACEMENT  ~ 1995 - 2004 X 5   "I've got a total of 5 stents" (02/24/2016)  . CORONARY ARTERY BYPASS GRAFT N/A 05/23/2019   Procedure: CORONARY ARTERY BYPASS GRAFTING (CABG), times three, using right greater saphenous vein and left internal mammary;  Surgeon: [name], MD;  Location: [facility] OR;  Service: Open Heart Surgery;  Laterality: N/A;  [place] only  . 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 INTRAVASCULAR PRESSURE WIRE/FFR STUDY N/A 05/12/2019   Procedure: INTRAVASCULAR PRESSURE WIRE/FFR STUDY;  Surgeon: Lyn Records, MD;  Location: [facility] INVASIVE CV LAB;  Service: Cardiovascular;  Laterality: N/A;  . LEFT HEART CATH AND CORONARY ANGIOGRAPHY N/A 05/12/2019   Procedure: LEFT HEART CATH AND CORONARY ANGIOGRAPHY;  Surgeon: Lyn Records, MD;  Location: [facility] INVASIVE CV LAB;  Service: Cardiovascular;  Laterality: N/A;  . PACEMAKER GENERATOR CHANGE  03/2011    [facility] [place]  . TEE WITHOUT CARDIOVERSION N/A 05/23/2019   Procedure: TRANSESOPHAGEAL ECHOCARDIOGRAM (TEE);  Surgeon: [name], MD;  Location: [facility] OR;  Service: Open Heart Surgery;  Laterality: N/A;  . TONSILLECTOMY       Medications: Current Meds  Medication Sig  . acetaminophen (TYLENOL) 325 MG tablet Take 2 tablets (650 mg total) by mouth every 6 (six) hours as needed for mild pain (or Fever >/= 101).  Marland Kitchen amiodarone (PACERONE) 200 MG tablet Take 1 tablet (200 mg total) by mouth daily.  Marland Kitchen apixaban (ELIQUIS) 5 MG TABS tablet Take 5 mg by mouth 2 (two) times daily.  Marland Kitchen aspirin 81 MG chewable tablet Chew 1 tablet (81 mg total) by mouth daily.  Marland Kitchen atorvastatin (LIPITOR) 80 MG tablet Take 1 tablet (80 mg total) by mouth daily.  . carboxymethylcellulose  (REFRESH PLUS) 0.5 % SOLN Place 1 drop into both eyes at bedtime.  . Cholecalciferol (VITAMIN D3) 50 MCG (2000 UT) TABS Take 2,000 Units by mouth daily.  Marland Kitchen dextromethorphan-guaiFENesin (MUCINEX DM) 30-600 MG 12hr tablet Take 1 tablet by mouth 2 (two) times daily as needed for cough.  . digoxin (LANOXIN) 0.125 MG tablet Take 1 tablet (0.125 mg total) by mouth daily.  Marland Kitchen gabapentin (NEURONTIN) 100 MG capsule Take 2 capsules (200 mg total) by mouth 2 (two) times daily.  Marland Kitchen levalbuterol (XOPENEX HFA) 45 MCG/ACT inhaler Inhale 2 puffs into the lungs every 6 (six) hours as needed for wheezing or shortness of breath.  . levothyroxine (SYNTHROID) 75 MCG tablet Take 75 mcg by mouth daily before breakfast.  . midodrine (PROAMATINE) 5 MG tablet Take  1 tablet (5 mg total) by mouth 3 (three) times daily with meals.  . mometasone (ASMANEX) 220 MCG/INH inhaler Inhale 2 puffs into the lungs 2 (two) times daily.   . montelukast (SINGULAIR) 10 MG tablet Take 10 mg by mouth 2 (two) times daily.   . Multiple Vitamin (MULTIVITAMIN WITH MINERALS) TABS tablet Take 1 tablet by mouth daily.  . nitroGLYCERIN (NITROSTAT) 0.4 MG SL tablet Place 0.4 mg under the tongue every 5 (five) minutes as needed for chest pain.   Marland Kitchen omega-3 acid ethyl esters (LOVAZA) 1 g capsule Take 1 capsule (1 g total) by mouth 2 (two) times daily.  Marland Kitchen omeprazole (PRILOSEC) 20 MG capsule Take 20 mg by mouth 2 (two) times daily before a meal.  . Propylene Glycol (SYSTANE BALANCE) 0.6 % SOLN Place 1 drop into both eyes See admin instructions. Place 1 drop into each eye four times a day and apply a warm compress for 10 minutes afterwards  . Tiotropium Bromide-Olodaterol (STIOLTO RESPIMAT) 2.5-2.5 MCG/ACT AERS Inhale 2 puffs into the lungs daily.   . traMADol (ULTRAM) 50 MG tablet Take 1 tablet (50 mg total) by mouth every 4 (four) hours as needed for moderate pain.  . [DISCONTINUED] amoxicillin-clavulanate (AUGMENTIN) 875-125 MG tablet Take 1 tablet by mouth  2 (two) times daily.     Allergies: Allergies  Allergen Reactions  . Crestor [Rosuvastatin Calcium] Other (See Comments)    Back spasms, but can tolerate it at a low dose now    Social History: The patient  reports that he has been smoking cigarettes. He has a 105.00 pack-year smoking history. He has never used smokeless tobacco. He reports current alcohol use. He reports previous drug use. Drug: Cocaine.   Family History: The patient's family history includes Heart attack in his father and mother; Heart attack (age of onset: 5) in his sister.   Review of Systems: Please see the history of present illness.   All other systems are reviewed and negative.   Physical Exam: VS:  BP 130/62   Pulse 79   Ht 5\' 11"  (1.803 m)   Wt 234 lb 12.8 oz (106.5 kg)   SpO2 97%   BMI 32.75 kg/m  .  BMI Body mass index is 32.75 kg/m.  Wt Readings from Last 3 Encounters:  06/14/19 234 lb 12.8 oz (106.5 kg)  06/01/19 235 lb 14.3 oz (107 kg)  05/15/19 237 lb 11.2 oz (107.8 kg)    General: Alert. Chronically ill appearing. He is in no acute distress.  Has oxygen in place.  Cardiac: Regular rate and rhythm. No murmurs, rubs, or gallops. Heart tones are distant. Sternum looks ok. He has 2+ edema on the right and less on the left.   Respiratory:  Lungs are coarse  work of breathing.  GI: Soft and nontender.  MS: No deformity or atrophy. Gait and ROM intact.  Skin: Warm and dry. Color is normal.  Neuro:  Strength and sensation are intact and no gross focal deficits noted.  Psych: Alert, appropriate and with normal affect.   LABORATORY DATA:  EKG:  EKG is ordered today.  Personally reviewed by me. This demonstrates AF with controlled VR of 79.  Lab Results  Component Value Date   WBC 8.9 05/29/2019   HGB 11.0 (L) 05/29/2019   HCT 32.6 (L) 05/29/2019   PLT 173 05/29/2019   GLUCOSE 111 (H) 05/31/2019   CHOL 126 05/13/2019   TRIG 101 05/13/2019   HDL 36 (L) 05/13/2019  LDLCALC 70  05/13/2019   ALT 14 05/13/2019   AST 19 05/13/2019   NA 135 05/31/2019   K 3.7 05/31/2019   CL 92 (L) 05/31/2019   CREATININE 1.22 05/31/2019   BUN 20 05/31/2019   CO2 34 (H) 05/31/2019   TSH 0.321 (L) 05/25/2019   INR 1.3 (H) 05/23/2019   HGBA1C 6.3 (H) 05/23/2019     BNP (last 3 results) Recent Labs    06/21/18 2120 05/21/19 0526  BNP 210.5* 122.3*    ProBNP (last 3 results) No results for input(s): PROBNP in the last 8760 hours.   Other Studies Reviewed Today:  Procedure (s) 05/2019:  1. Median Sternotomy 2. Extracorporeal circulation 3. Coronary artery bypass grafting x 3   Left internal mammary artery graft to the LAD  SVG to diagonal  SVG to OM  4. Endoscopic vein harvest from the rightleg 5. Clipping of left atrial appendage by Dr. Laneta Simmers on 05/23/2019.   Left heart catheterization 05/12/19:  Recurrent, probably ischemically mediated ventricular tachycardia with multiple shocks.  High-grade native and diffuse in-stent restenosis ostial to mid circumflex 95 to 99%. There is also a 90% stenosis beyond the stented segment.  Widely patent left main  Eccentric bulky and calcified 50 to 65% proximal to mid LAD beyond the origin of the large first diagonal. Eccentric bulky and calcified ostial to proximal 70% first diagonal. Both LAD and diagonal are mildly hemodynamically significant based upon RFR values 0.89 and 0.87, respectively. RFR 0.89 or less is hemodynamically significant.  The right coronary particularly in the mid segment contains diffuse atherosclerosis but no focal high-grade obstruction.  Left ventriculography is not performed because of renal insufficiency. LVEDP is normal measuring 10 mmHg.  RECOMMENDATIONS:   We need to determine if the patient has any surgical options. The likelihood of durable circumflex result is low due to the diffuse nature of disease and the need to place stents over a long segment, most of which  would be stent sandwich. The LAD and diagonal are mildly hemodynamically significant and could be grafted.  If PCI, will need orbital atherectomy over a long segment then reexpansion of the in-stent restenosis and if resistant, possibly shockwave PCI followed by extensive ostial to mid vessel restenting.  Echocardiogram 05/13/19: 1. Akinesis of the inferior and inferolateral walls with overall mild to  moderate LV dysfunction; grade 2 diastolic dysfunction; mild MR; mild LAE.  2. Left ventricular ejection fraction, by estimation, is 40 to 45%. The  left ventricle has mildly decreased function. The left ventricle  demonstrates regional wall motion abnormalities (see scoring  diagram/findings for description). The left ventricular  internal cavity size was moderately dilated. Left ventricular diastolic  parameters are consistent with Grade II diastolic dysfunction  (pseudonormalization).  3. Right ventricular systolic function is normal. The right ventricular  size is normal.  4. Left atrial size was mildly dilated.  5. The mitral valve is normal in structure. Mild mitral valve  regurgitation. No evidence of mitral stenosis.  6. The aortic valve has an indeterminant number of cusps. Aortic valve  regurgitation is not visualized. No aortic stenosis is present.  7. The inferior vena cava is normal in size with greater than 50%  respiratory variability, suggesting right atrial pressure of 3 mmHg.     ASSESSMENT & PLAN:   1. CAD - recent multiple shocks due to ischemia - now s/p CABG x 3 with LA clipping - slow but stable progress. Lab today.   2. Persistent AF -  back on amiodarone. Has had LA clipping - remains on anticoagulation - could consider cardioversion at some point.   3. Chronic anticoagulation - he is on Eliquis.   4. Severe COPD - now on oxygen - seeing pulmonary thru the [place] tomorrow.   5. Post op blood loss anemia - recheck lab today.   6. Sputum with Hflu - has  finished his antibiotics.    7. HLD - on high intensity statin therapy.   8. Ischemic CM with chronic systolic HF - EF 40 to 97% - not on ideal therapy - requiring Midodrine for BP support.   9. High risk medicine - lab today.   10. Underlying ICD - will need to get back with EP here. He has had prior multiple shocks - he is not to drive for 6 months. He tells me towards the end of the visit that he was shocked on Sunday, that the [place] is planning on generator change out later this month, that he sees EP/cards/pulmonary there. Overall situation is tenuous.   Discussed with Dr. Tamala Julian - we will send our records to the [facility] for his visit for tomorrow. Lab here today. Device is followed thru the [place] per patient's report. It may be in Mr. Lowden's best interest to have all of his care thru the [place] in order to maintain continuity. We need definitive assurance that he has all this in place thru the [place].   10. COVID-19 Education: The signs and symptoms of COVID-19 were discussed with the patient and how to seek care for testing (follow up with PCP or arrange E-visit).  The importance of social distancing, staying at home, hand hygiene and wearing a mask when out in public were discussed today.  Current medicines are reviewed with the patient today.  The patient does not have concerns regarding medicines other than what has been noted above.  The following changes have been made:  See above.  Labs/ tests ordered today include:    Orders Placed This Encounter  Procedures  . Basic metabolic panel  . CBC  . Digoxin level  . Magnesium  . EKG 12-Lead     Disposition:   FU with [place] in about 3 to 4 weeks but we can transition totally over the [place] if he truly can have all his care coordinated there. We are happy to help as needed. Today's note is sent to the [place] and patient is given our card with fax number and copy of his EKG.   Patient is agreeable to this plan and will call if any problems develop in the  interim.   SignedTruitt Merle, NP  06/14/2019 9:45 AM  [facility] [address] [place], [place]  [address] Phone: [phone] Fax: [phone]

## 2019-06-14 ENCOUNTER — Ambulatory Visit (INDEPENDENT_AMBULATORY_CARE_PROVIDER_SITE_OTHER): Payer: No Typology Code available for payment source | Admitting: Nurse Practitioner

## 2019-06-14 ENCOUNTER — Encounter: Payer: Self-pay | Admitting: Nurse Practitioner

## 2019-06-14 ENCOUNTER — Other Ambulatory Visit: Payer: Self-pay

## 2019-06-14 ENCOUNTER — Telehealth: Payer: Self-pay | Admitting: *Deleted

## 2019-06-14 VITALS — BP 130/62 | HR 79 | Ht 71.0 in | Wt 234.8 lb

## 2019-06-14 DIAGNOSIS — I480 Paroxysmal atrial fibrillation: Secondary | ICD-10-CM

## 2019-06-14 DIAGNOSIS — I428 Other cardiomyopathies: Secondary | ICD-10-CM

## 2019-06-14 DIAGNOSIS — Z951 Presence of aortocoronary bypass graft: Secondary | ICD-10-CM | POA: Diagnosis not present

## 2019-06-14 DIAGNOSIS — I5022 Chronic systolic (congestive) heart failure: Secondary | ICD-10-CM | POA: Diagnosis not present

## 2019-06-14 DIAGNOSIS — I429 Cardiomyopathy, unspecified: Secondary | ICD-10-CM

## 2019-06-14 NOTE — Telephone Encounter (Signed)
Called 10 times and t/w to different switchboard operators, went through 5 different extensions and no one ever picked up phone, stated their lines are not good.   This was for pulmonology Dr. Archie Patten, which they did not think they had a Dr. Archie Patten working and Dr. Lorelle Gibbs, Cardiologist.  LVM for pt per Lawson Fiscal to tell [facility] they need to call this office so someone can fax to the [facility] Lori's ov note and labs from today. Pt has Lori's business card with office telephone number and fax number on it.

## 2019-06-14 NOTE — Patient Instructions (Addendum)
After Visit Summary:  We will be checking the following labs today - Dig level, Mag level, BMET & CBC   Medication Instructions:    Continue with your current medicines.    If you need a refill on your cardiac medications before your next appointment, please call your pharmacy.     Testing/Procedures To Be Arranged:  N/A  Follow-Up:  See me or Dr. Katrinka Blazing towards the end of the month.    At [facility], you and your health needs are our priority.  As part of our continuing mission to provide you with exceptional heart care, we have created designated Provider Care Teams.  These Care Teams include your primary Cardiologist (physician) and Advanced Practice Providers (APPs -  Physician Assistants and Nurse Practitioners) who all work together to provide you with the care you need, when you need it.  Special Instructions:  . Stay safe, stay home, wash your hands for at least 20 seconds and wear a mask when out in public.  . It was good to talk with you today.  . We will send our note from today to the [place] . Good work on not smoking!   Call the [facility] office at [phone] if you have any questions, problems or concerns.

## 2019-06-15 ENCOUNTER — Other Ambulatory Visit: Payer: Self-pay | Admitting: *Deleted

## 2019-06-15 ENCOUNTER — Telehealth: Payer: Self-pay | Admitting: Nurse Practitioner

## 2019-06-15 LAB — CBC
Hematocrit: 40.7 % (ref 37.5–51.0)
Hemoglobin: 13.6 g/dL (ref 13.0–17.7)
MCH: 31.7 pg (ref 26.6–33.0)
MCHC: 33.4 g/dL (ref 31.5–35.7)
MCV: 95 fL (ref 79–97)
Platelets: 282 10*3/uL (ref 150–450)
RBC: 4.29 x10E6/uL (ref 4.14–5.80)
RDW: 14 % (ref 11.6–15.4)
WBC: 8.5 10*3/uL (ref 3.4–10.8)

## 2019-06-15 LAB — BASIC METABOLIC PANEL
BUN/Creatinine Ratio: 8 — ABNORMAL LOW (ref 10–24)
BUN: 10 mg/dL (ref 8–27)
CO2: 28 mmol/L (ref 20–29)
Calcium: 9.4 mg/dL (ref 8.6–10.2)
Chloride: 95 mmol/L — ABNORMAL LOW (ref 96–106)
Creatinine, Ser: 1.19 mg/dL (ref 0.76–1.27)
GFR calc Af Amer: 73 mL/min/{1.73_m2} (ref >59)
GFR calc non Af Amer: 63 mL/min/{1.73_m2} (ref >59)
Glucose: 125 mg/dL — ABNORMAL HIGH (ref 65–99)
Potassium: 3.9 mmol/L (ref 3.5–5.2)
Sodium: 142 mmol/L (ref 134–144)

## 2019-06-15 LAB — DIGOXIN LEVEL: Digoxin, Serum: 1.4 ng/mL — ABNORMAL HIGH (ref 0.5–0.9)

## 2019-06-15 LAB — MAGNESIUM: Magnesium: 2.1 mg/dL (ref 1.6–2.3)

## 2019-06-15 MED ORDER — DIGOXIN 125 MCG PO TABS: 0.0625 mg | ORAL_TABLET | Freq: Every day | ORAL | 3 refills | Status: DC

## 2019-06-15 NOTE — Telephone Encounter (Signed)
lvm for pt s/w Shanda Bumps in medical records and is faxing over requested notes to Dr. Jillyn Hidden with the [place].

## 2019-06-15 NOTE — Telephone Encounter (Signed)
Patient returning call for lab results. Transferred call to the nurse. 

## 2019-06-15 NOTE — Telephone Encounter (Signed)
Follow up  Pt would like for all of [facility] medical records [facility] to his [facility] office, fax number [phone] attn to Lorelle Gibbs  Please advise

## 2019-06-16 ENCOUNTER — Other Ambulatory Visit: Payer: Self-pay

## 2019-06-16 ENCOUNTER — Emergency Department (HOSPITAL_COMMUNITY)
Admission: EM | Admit: 2019-06-16 | Discharge: 2019-06-16 | Disposition: A | Discharge status: Home / Self Care | Payer: No Typology Code available for payment source | Attending: Emergency Medicine | Admitting: Emergency Medicine

## 2019-06-16 ENCOUNTER — Encounter (HOSPITAL_COMMUNITY): Payer: Self-pay | Admitting: Emergency Medicine

## 2019-06-16 DIAGNOSIS — Z9889 Other specified postprocedural states: Secondary | ICD-10-CM | POA: Insufficient documentation

## 2019-06-16 DIAGNOSIS — I252 Old myocardial infarction: Secondary | ICD-10-CM | POA: Diagnosis not present

## 2019-06-16 DIAGNOSIS — L539 Erythematous condition, unspecified: Secondary | ICD-10-CM | POA: Insufficient documentation

## 2019-06-16 DIAGNOSIS — Z720 Tobacco use: Secondary | ICD-10-CM | POA: Insufficient documentation

## 2019-06-16 DIAGNOSIS — I4891 Unspecified atrial fibrillation: Secondary | ICD-10-CM | POA: Diagnosis not present

## 2019-06-16 DIAGNOSIS — Z951 Presence of aortocoronary bypass graft: Secondary | ICD-10-CM | POA: Insufficient documentation

## 2019-06-16 DIAGNOSIS — J449 Chronic obstructive pulmonary disease, unspecified: Secondary | ICD-10-CM | POA: Insufficient documentation

## 2019-06-16 DIAGNOSIS — M7989 Other specified soft tissue disorders: Secondary | ICD-10-CM

## 2019-06-16 DIAGNOSIS — I509 Heart failure, unspecified: Secondary | ICD-10-CM | POA: Diagnosis not present

## 2019-06-16 DIAGNOSIS — Z7901 Long term (current) use of anticoagulants: Secondary | ICD-10-CM | POA: Diagnosis not present

## 2019-06-16 DIAGNOSIS — Z79899 Other long term (current) drug therapy: Secondary | ICD-10-CM | POA: Diagnosis not present

## 2019-06-16 LAB — CBC WITH DIFFERENTIAL/PLATELET
Abs Immature Granulocytes: 0.02 10*3/uL (ref 0.00–0.07)
Basophils Absolute: 0 10*3/uL (ref 0.0–0.1)
Basophils Relative: 1 %
Eosinophils Absolute: 0.1 10*3/uL (ref 0.0–0.5)
Eosinophils Relative: 2 %
HCT: 42.2 % (ref 39.0–52.0)
Hemoglobin: 13.3 g/dL (ref 13.0–17.0)
Immature Granulocytes: 0 %
Lymphocytes Relative: 28 %
Lymphs Abs: 1.7 10*3/uL (ref 0.7–4.0)
MCH: 31.1 pg (ref 26.0–34.0)
MCHC: 31.5 g/dL (ref 30.0–36.0)
MCV: 98.6 fL (ref 80.0–100.0)
Monocytes Absolute: 0.7 10*3/uL (ref 0.1–1.0)
Monocytes Relative: 12 %
Neutro Abs: 3.5 10*3/uL (ref 1.7–7.7)
Neutrophils Relative %: 57 %
Platelets: 210 10*3/uL (ref 150–400)
RBC: 4.28 MIL/uL (ref 4.22–5.81)
RDW: 15.6 % — ABNORMAL HIGH (ref 11.5–15.5)
WBC: 6 10*3/uL (ref 4.0–10.5)
nRBC: 0 % (ref 0.0–0.2)

## 2019-06-16 LAB — COMPREHENSIVE METABOLIC PANEL
ALT: 23 U/L (ref 0–44)
AST: 33 U/L (ref 15–41)
Albumin: 3.4 g/dL — ABNORMAL LOW (ref 3.5–5.0)
Alkaline Phosphatase: 111 U/L (ref 38–126)
Anion gap: 14 (ref 5–15)
BUN: 9 mg/dL (ref 8–23)
CO2: 32 mmol/L (ref 22–32)
Calcium: 9.1 mg/dL (ref 8.9–10.3)
Chloride: 95 mmol/L — ABNORMAL LOW (ref 98–111)
Creatinine, Ser: 1.28 mg/dL — ABNORMAL HIGH (ref 0.61–1.24)
GFR calc Af Amer: >60 mL/min (ref >60)
GFR calc non Af Amer: 58 mL/min — ABNORMAL LOW (ref >60)
Glucose, Bld: 117 mg/dL — ABNORMAL HIGH (ref 70–99)
Potassium: 5 mmol/L (ref 3.5–5.1)
Sodium: 141 mmol/L (ref 135–145)
Total Bilirubin: 0.9 mg/dL (ref 0.3–1.2)
Total Protein: 6.4 g/dL — ABNORMAL LOW (ref 6.5–8.1)

## 2019-06-16 NOTE — Discharge Instructions (Addendum)
Your labs showed no significant issues. Please return tomorrow morning for a DVT study. Continue to take your Eliquis.

## 2019-06-16 NOTE — ED Notes (Signed)
Patient verbalizes understanding of discharge instructions. Opportunity for questioning and answers were provided. Armband removed by staff, pt discharged from ED ambulatory w/ family  

## 2019-06-16 NOTE — ED Triage Notes (Signed)
Patient arrived with EMS from home reports skin redness with swelling at right upper thigh onset this week with no drainage , denies fever or chills , CABG last 05/23/2019 .

## 2019-06-16 NOTE — ED Provider Notes (Signed)
[facility] EMERGENCY DEPARTMENT Provider Note   CSN: [id number] Arrival date & time: 06/16/19  2034     History Chief Complaint  Patient presents with  . Cellulitis    Max Rivas is a 69 y.o. male who presents emergency department for evaluation of right leg redness and swelling.  He is status post CABG x3 and discharged on 05/24/2019.  Patient states that his incision site is on his right medial thigh.  The patient says that he has no pain but over the past few days has noticed worsening erythema.  His home health nurse noticed "a knot" in the leg.  He denies having any pain there.  He was off of his Eliquis for his procedures but has started back on it over the past few days.  He denies any chest pain, shortness of breath, fevers or chills.  HPI     Past Medical History:  Diagnosis Date  . Arthritis    "elbows, knees" (02/24/2016)  . Bronchitis 2006  . CHF (congestive heart failure) (HCC)   . COPD (chronic obstructive pulmonary disease) (HCC)   . Coronary artery disease   . GERD (gastroesophageal reflux disease)   . High cholesterol   . Myocardial infarction [facility]) ~ 1995 X 2;1998; 2000; 2004  . On home oxygen therapy    "2L w/activity" (02/24/2016)  . Pacemaker   . Pulmonary embolism (HCC) 02/24/2016  . Sleep apnea    "have CPAP; can't tolerate it" (02/24/2016)  . Unstable angina (HCC) 05/21/2019    Patient Active Problem List   Diagnosis Date Noted  . Unstable angina (HCC) 05/21/2019  . Ventricular tachycardia (HCC) 05/12/2019  . Chest pain   . VT (ventricular tachycardia) (HCC) 05/11/2019  . Arrhythmia 03/12/2019  . On home O2   . Chronic combined systolic and diastolic CHF (congestive heart failure) (HCC) 06/22/2018  . Chronic respiratory failure with hypoxia (HCC) 06/22/2018  . Congestive heart failure (CHF) (HCC) 06/22/2018  . DCM (dilated cardiomyopathy) (HCC) 05/25/2017  . Coronary artery disease 05/25/2017  . Chronic atrial fibrillation  05/25/2017  . COPD (chronic obstructive pulmonary disease) (HCC) 05/25/2017  . CKD (chronic kidney disease) 05/25/2017  . Hypothyroidism 05/25/2017  . 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 OSA (obstructive sleep apnea) 05/25/2017  . Pulmonary HTN (HCC) 05/25/2017  . Acute on chronic respiratory failure with hypoxia (HCC)   . Mediastinal adenopathy   . Cervical adenopathy   . Acute respiratory failure with hypoxia (HCC) 02/24/2016  . Pulmonary embolus (HCC) 02/24/2016  . Pulmonary artery thrombosis (HCC)   . Pleural effusion   . Hypoxemia   . Lung mass   . Pleural plaque   . Flutter-fibrillation (HCC) 01/15/2016  . Acute systolic congestive heart failure (HCC)   . Atrial fibrillation with RVR (HCC)   . Hypercholesterolemia 03/11/2006  . Tobacco abuse 03/11/2006  . Acute on chronic systolic congestive heart failure (HCC) 03/11/2006  . GASTROESOPHAGEAL REFLUX, NO ESOPHAGITIS 03/11/2006  . OSTEOARTHRITIS, MULTI SITES 03/11/2006  . HIGH RISK PATIENT 03/11/2006  . Tobacco dependence 03/11/2006    Past Surgical History:  Procedure Laterality Date  . CLIPPING OF ATRIAL APPENDAGE N/A 05/23/2019   Procedure: CLIPPING OF ATRIAL APPENDAGE with atriclip;  Surgeon: [name], MD;  Location: [facility] OR;  Service: Open Heart Surgery;  Laterality: N/A;  . CORONARY ANGIOPLASTY  1995  . CORONARY ANGIOPLASTY WITH STENT PLACEMENT  ~ 1995 - 2004 X 5   "I've got a total of 5  stents" (02/24/2016)  . CORONARY ARTERY BYPASS GRAFT N/A 05/23/2019   Procedure: CORONARY ARTERY BYPASS GRAFTING (CABG), times three, using right greater saphenous vein and left internal mammary;  Surgeon: Gaye Pollack, MD;  Location: [place] OR;  Service: Open Heart Surgery;  Laterality: N/A;  [place] only  . 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 INTRAVASCULAR PRESSURE WIRE/FFR STUDY N/A 05/12/2019   Procedure: INTRAVASCULAR PRESSURE WIRE/FFR  STUDY;  Surgeon: Belva Crome, MD;  Location: [place] CV LAB;  Service: Cardiovascular;  Laterality: N/A;  . LEFT HEART CATH AND CORONARY ANGIOGRAPHY N/A 05/12/2019   Procedure: LEFT HEART CATH AND CORONARY ANGIOGRAPHY;  Surgeon: Belva Crome, MD;  Location: [place] CV LAB;  Service: Cardiovascular;  Laterality: N/A;  . PACEMAKER GENERATOR CHANGE  03/2011    [facility] [place]  . TEE WITHOUT CARDIOVERSION N/A 05/23/2019   Procedure: TRANSESOPHAGEAL ECHOCARDIOGRAM (TEE);  Surgeon: Gaye Pollack, MD;  Location: [place];  Service: Open Heart Surgery;  Laterality: N/A;  . TONSILLECTOMY         Family History  Problem Relation Age of Onset  . Heart attack Mother   . Heart attack Father   . Heart attack Sister 84    Social History   Tobacco Use  . Smoking status: Current Some Day Smoker    Packs/day: 3.00    Years: 35.00    Pack years: 105.00    Types: Cigarettes  . Smokeless tobacco: Never Used  . Tobacco comment: 02/24/2016 "stopped ~ 8-9 months ago; have had 5 cigarettes in the last week"  Substance Use Topics  . Alcohol use: Yes    Comment: 02/24/2016 "none in a long time"  . Drug use: Not Currently    Types: Cocaine    Comment: 02/24/2016 "none since 1995"    Home Medications Prior to Admission medications   Medication Sig Start Date End Date Taking? Authorizing Provider  acetaminophen (TYLENOL) 325 MG tablet Take 2 tablets (650 mg total) by mouth every 6 (six) hours as needed for mild pain (or Fever >/= 101). 03/05/16  Yes Robbie Lis, MD  amiodarone (PACERONE) 200 MG tablet Take 1 tablet (200 mg total) by mouth daily. 06/01/19  Yes Barrett, Erin R, PA-C  apixaban (ELIQUIS) 5 MG TABS tablet Take 5 mg by mouth 2 (two) times daily.   Yes [provider]  aspirin 81 MG chewable tablet Chew 1 tablet (81 mg total) by mouth daily. 05/16/19  Yes Kroeger, Daleen Snook M., PA-C  atorvastatin (LIPITOR) 80 MG tablet Take 1 tablet (80 mg total) by mouth daily. 05/15/19  Yes Kroeger,  Daleen Snook M., PA-C  carboxymethylcellulose (REFRESH PLUS) 0.5 % SOLN Place 1 drop into both eyes at bedtime.   Yes [provider]  Cholecalciferol (VITAMIN D3) 50 MCG (2000 UT) TABS Take 2,000 Units by mouth daily.   Yes [provider]  dextromethorphan-guaiFENesin (MUCINEX DM) 30-600 MG 12hr tablet Take 1 tablet by mouth 2 (two) times daily as needed for cough. 06/01/19  Yes Barrett, Erin R, PA-C  digoxin (LANOXIN) 0.125 MG tablet Take 0.5 tablets (0.0625 mg total) by mouth daily. 06/15/19  Yes Burtis Junes, NP  furosemide (LASIX) 40 MG tablet Take 40 mg by mouth 2 (two) times daily.   Yes [provider]  gabapentin (NEURONTIN) 100 MG capsule Take 2 capsules (200 mg total) by mouth 2 (two) times daily. 06/01/19  Yes  Barrett, Erin R, PA-C  levalbuterol (XOPENEX HFA) 45 MCG/ACT inhaler Inhale 2 puffs into the lungs every 6 (six) hours as needed for wheezing or shortness of breath.   Yes [provider]  levothyroxine (SYNTHROID) 75 MCG tablet Take 75 mcg by mouth daily before breakfast.   Yes [provider]  midodrine (PROAMATINE) 5 MG tablet Take 1 tablet (5 mg total) by mouth 3 (three) times daily with meals. 06/01/19  Yes Barrett, Erin R, PA-C  mometasone (ASMANEX) 220 MCG/INH inhaler Inhale 2 puffs into the lungs 2 (two) times daily.    Yes [provider]  montelukast (SINGULAIR) 10 MG tablet Take 10 mg by mouth 2 (two) times daily.    Yes [provider]  Multiple Vitamin (MULTIVITAMIN WITH MINERALS) TABS tablet Take 1 tablet by mouth daily.   Yes [provider]  nitroGLYCERIN (NITROSTAT) 0.4 MG SL tablet Place 0.4 mg under the tongue every 5 (five) minutes as needed for chest pain.    Yes [provider]  omega-3 acid ethyl esters (LOVAZA) 1 g capsule Take 1 capsule (1 g total) by mouth 2 (two) times daily. 03/05/16  Yes Alison Murray, MD  omeprazole (PRILOSEC) 20 MG capsule Take 20 mg by mouth 2 (two) times daily  before a meal.   Yes [provider]  Propylene Glycol (SYSTANE BALANCE) 0.6 % SOLN Place 1 drop into both eyes See admin instructions. Place 1 drop into each eye four times a day and apply a warm compress for 10 minutes afterwards   Yes [provider]  Tiotropium Bromide-Olodaterol (STIOLTO RESPIMAT) 2.5-2.5 MCG/ACT AERS Inhale 2 puffs into the lungs daily.    Yes [provider]  traMADol (ULTRAM) 50 MG tablet Take 1 tablet (50 mg total) by mouth every 4 (four) hours as needed for moderate pain. Patient taking differently: Take 50 mg by mouth every 4 (four) hours as needed for severe pain.  06/01/19  Yes Barrett, Rae Roam, PA-C    Allergies    Crestor [rosuvastatin calcium]  Review of Systems   Review of Systems Ten systems reviewed and are negative for acute change, except as noted in the HPI.   Physical Exam Updated Vital Signs BP 111/61 (BP Location: Right Arm)   Pulse 75   Temp 97.7 F (36.5 C)   Resp 16   Ht 5\' 11"  (1.803 m)   Wt 115 kg   SpO2 100%   BMI 35.36 kg/m   Physical Exam Vitals and nursing note reviewed.  Constitutional:      General: He is not in acute distress.    Appearance: He is well-developed. He is not diaphoretic.  HENT:     Head: Normocephalic and atraumatic.  Eyes:     General: No scleral icterus.    Conjunctiva/sclera: Conjunctivae normal.  Cardiovascular:     Rate and Rhythm: Normal rate and regular rhythm.     Heart sounds: Normal heart sounds.  Pulmonary:     Effort: Pulmonary effort is normal. No respiratory distress.     Breath sounds: Normal breath sounds.  Abdominal:     Palpations: Abdomen is soft.     Tenderness: There is no abdominal tenderness.  Musculoskeletal:     Cervical back: Normal range of motion and neck supple.     Comments: Erythema and edema on the medial side of the right leg.  There is ropey cordlike structure palpable.  No tenderness to palpation.  Skin:    General: Skin  is warm and dry.   Neurological:     Mental Status: He is alert.  Psychiatric:        Behavior: Behavior normal.     ED Results / Procedures / Treatments   Labs (all labs ordered are listed, but only abnormal results are displayed) Labs Reviewed  COMPREHENSIVE METABOLIC PANEL - Abnormal; Notable for the following components:      Result Value   Chloride 95 (*)    Glucose, Bld 117 (*)    Creatinine, Ser 1.28 (*)    Total Protein 6.4 (*)    Albumin 3.4 (*)    GFR calc non Af Amer 58 (*)    All other components within normal limits  CBC WITH DIFFERENTIAL/PLATELET - Abnormal; Notable for the following components:   RDW 15.6 (*)    All other components within normal limits    EKG EKG Interpretation  Date/Time:  Friday June 16 2019 20:40:34 EDT Ventricular Rate:  63 PR Interval:    QRS Duration: 128 QT Interval:  416 QTC Calculation: 426 R Axis:   78 Text Interpretation: Atrial fibrillation Ventricular premature complex Nonspecific intraventricular conduction delay Probable inferior infarct, old Lateral leads are also involved No significant change since last tracing Confirmed by Linwood Dibbles [phone]) on 06/16/2019 8:42:37 PM   Radiology No results found.  Procedures Procedures (including critical care time)  Medications Ordered in ED Medications - No data to display  ED Course  I have reviewed the triage vital signs and the nursing notes.  Pertinent labs & imaging results that were available during my care of the patient were reviewed by me and considered in my medical decision making (see chart for details).  Clinical Course as of Jun 16 2235  Fri Jun 16, 2019  2209 WBC: 6.0 [AH]    Clinical Course User Index [AH] Arthor Captain, PA-C   MDM Rules/Calculators/A&P                      Patient here with complaint of right upper extremity swelling and redness after catheterization procedure.  I reviewed the patient's labs which shows no elevated white blood cell count.  He does have  a slight increase in his creatinine but does not fit AKI criteria.  I have ordered an outpatient DVT study.  Patient is already on Eliquis.  Suspect phlebitis.  Patient appears otherwise appropriate for discharge at this time.  He denies chest pain or shortness of breath.  He is compliant with his Eliquis. Final Clinical Impression(s) / ED Diagnoses Final diagnoses:  Right leg swelling    Rx / DC Orders ED Discharge Orders         Ordered    LE VENOUS     06/16/19 2229           Arthor Captain, PA-C 06/16/19 2237    Linwood Dibbles, MD 06/17/19 0009

## 2019-06-17 ENCOUNTER — Ambulatory Visit (HOSPITAL_COMMUNITY)
Admission: RE | Admit: 2019-06-17 | Discharge: 2019-06-17 | Disposition: A | Discharge status: Home / Self Care | Payer: Medicare Other | Source: Ambulatory Visit | Attending: Emergency Medicine | Admitting: Emergency Medicine

## 2019-06-17 DIAGNOSIS — L538 Other specified erythematous conditions: Secondary | ICD-10-CM

## 2019-06-17 DIAGNOSIS — M7989 Other specified soft tissue disorders: Secondary | ICD-10-CM | POA: Diagnosis present

## 2019-06-17 NOTE — Progress Notes (Signed)
VASCULAR LAB PRELIMINARY  PRELIMINARY  PRELIMINARY  PRELIMINARY  Right lower extremity venous duplex completed.    Preliminary report:  See CV proc for preliminary results.   Anaija Wissink, RVT 06/17/2019, 11:58 AM

## 2019-06-27 ENCOUNTER — Other Ambulatory Visit: Payer: Self-pay | Admitting: Surgery

## 2019-06-27 DIAGNOSIS — Z951 Presence of aortocoronary bypass graft: Secondary | ICD-10-CM

## 2019-06-28 ENCOUNTER — Other Ambulatory Visit: Payer: Self-pay

## 2019-06-28 ENCOUNTER — Ambulatory Visit
Admission: RE | Admit: 2019-06-28 | Discharge: 2019-06-28 | Disposition: A | Discharge status: Home / Self Care | Payer: Medicare Other | Source: Ambulatory Visit | Attending: Surgery | Admitting: Surgery

## 2019-06-28 ENCOUNTER — Ambulatory Visit (INDEPENDENT_AMBULATORY_CARE_PROVIDER_SITE_OTHER): Payer: Self-pay | Admitting: Surgery

## 2019-06-28 ENCOUNTER — Encounter: Payer: Self-pay | Admitting: Surgery

## 2019-06-28 VITALS — BP 124/72 | HR 76 | Temp 97.2°F | Resp 16 | Ht 71.0 in | Wt 234.0 lb

## 2019-06-28 DIAGNOSIS — Z951 Presence of aortocoronary bypass graft: Secondary | ICD-10-CM

## 2019-06-28 NOTE — Progress Notes (Signed)
HPI: Patient returns for routine postoperative follow-up having undergone coronary bypass graft surgery x3 on 05/23/2019. The patient's early postoperative recovery while in the hospital was notable for a slow postoperative recovery due to severe COPD.  He was treated with a course of IV antibiotic for bronchitis versus pneumonia.  This was converted to an oral antibiotic for another week after discharge.  He required persistent oxygen at 4 to 5 L by nasal cannula and was discharged on home oxygen at 3 L nasal cannula.  He had a history of previous paroxysmal atrial fibrillation and went back into atrial fibrillation postoperatively.  He was treated with amiodarone and digoxin for rate control and anticoagulated with Eliquis.  Since hospital discharge the patient reports that his oxygenation has improved and he is weaned down to 1 L by nasal cannula 24 hours/day.  He was seen in the emergency room on 06/16/2019 due to some concern about cellulitis in the right leg from vein harvest.  He was noticed to have some swelling along the vein harvest tunnel.  He was scheduled for outpatient venous Dopplers which were negative for DVT.  He had no signs of infection.  He said that he has been seen by cardiology at the [facility] where he gets his [facility] but has not followed up with Dr. Katrinka Blazing yet.   Current Outpatient Medications  Medication Sig Dispense Refill  . acetaminophen (TYLENOL) 325 MG tablet Take 2 tablets (650 mg total) by mouth every 6 (six) hours as needed for mild pain (or Fever >/= 101). 30 tablet 0  . amiodarone (PACERONE) 200 MG tablet Take 1 tablet (200 mg total) by mouth daily. 30 tablet 1  . apixaban (ELIQUIS) 5 MG TABS tablet Take 5 mg by mouth 2 (two) times daily.    Marland Kitchen aspirin 81 MG chewable tablet Chew 1 tablet (81 mg total) by mouth daily. 90 tablet 3  . atorvastatin (LIPITOR) 80 MG tablet Take 1 tablet (80 mg total) by mouth daily. 90 tablet 3  . carboxymethylcellulose (REFRESH PLUS) 0.5  % SOLN Place 1 drop into both eyes at bedtime.    . Cholecalciferol (VITAMIN D3) 50 MCG (2000 UT) TABS Take 2,000 Units by mouth daily.    Marland Kitchen dextromethorphan-guaiFENesin (MUCINEX DM) 30-600 MG 12hr tablet Take 1 tablet by mouth 2 (two) times daily as needed for cough.    . digoxin (LANOXIN) 0.125 MG tablet Take 0.5 tablets (0.0625 mg total) by mouth daily. 30 tablet 3  . furosemide (LASIX) 40 MG tablet Take 40 mg by mouth 2 (two) times daily.    Marland Kitchen gabapentin (NEURONTIN) 100 MG capsule Take 2 capsules (200 mg total) by mouth 2 (two) times daily. 60 capsule 1  . levalbuterol (XOPENEX HFA) 45 MCG/ACT inhaler Inhale 2 puffs into the lungs every 6 (six) hours as needed for wheezing or shortness of breath.    . levothyroxine (SYNTHROID) 75 MCG tablet Take 75 mcg by mouth daily before breakfast.    . midodrine (PROAMATINE) 5 MG tablet Take 1 tablet (5 mg total) by mouth 3 (three) times daily with meals. 90 tablet 1  . mometasone (ASMANEX) 220 MCG/INH inhaler Inhale 2 puffs into the lungs 2 (two) times daily.     . montelukast (SINGULAIR) 10 MG tablet Take 10 mg by mouth 2 (two) times daily.     . Multiple Vitamin (MULTIVITAMIN WITH MINERALS) TABS tablet Take 1 tablet by mouth daily.    . nitroGLYCERIN (NITROSTAT) 0.4 MG SL tablet Place  0.4 mg under the tongue every 5 (five) minutes as needed for chest pain.     Marland Kitchen omega-3 acid ethyl esters (LOVAZA) 1 g capsule Take 1 capsule (1 g total) by mouth 2 (two) times daily. 60 capsule 0  . omeprazole (PRILOSEC) 20 MG capsule Take 20 mg by mouth 2 (two) times daily before a meal.    . Propylene Glycol (SYSTANE BALANCE) 0.6 % SOLN Place 1 drop into both eyes See admin instructions. Place 1 drop into each eye four times a day and apply a warm compress for 10 minutes afterwards    . Tiotropium Bromide-Olodaterol (STIOLTO RESPIMAT) 2.5-2.5 MCG/ACT AERS Inhale 2 puffs into the lungs daily.     . traMADol (ULTRAM) 50 MG tablet Take 1 tablet (50 mg total) by mouth every 4  (four) hours as needed for moderate pain. (Patient taking differently: Take 50 mg by mouth every 4 (four) hours as needed for severe pain. ) 30 tablet 0   No current facility-administered medications for this visit.    Physical Exam: BP 124/72 (BP Location: Right Arm, Patient Position: Sitting, Cuff Size: Normal)   Pulse 76 Comment: IRREGULAR  Temp (!) 97.2 F (36.2 C)   Resp 16   Ht 5\' 11"  (1.803 m)   Wt 234 lb (106.1 kg)   SpO2 95% Comment: ON 1 L O2  BMI 32.64 kg/m  He looks well. Cardiac exam shows a regular rate and rhythm with normal heart sounds. Lung exam is clear. Chest incision is healing well and the sternum is stable. The right leg incision is healing well and there is no lower extremity edema.  Diagnostic Tests:  Narrative & Impression  CLINICAL DATA:  Shortness of breath.  CABG on 05/23/2019.  EXAM: CHEST - 2 VIEW  COMPARISON:  Chest x-rays dated 05/28/2019 and 05/11/2019 and chest CT dated 07/25/2018  FINDINGS: Cardiac silhouette is normal. Chronic prominence of the main pulmonary arteries suggesting pulmonary arterial hypertension. Small residual left pleural effusion, diminished since the prior study. Chronic blunting of the right costophrenic angle laterally. Chronic pleural calcifications on the right. Haziness overlying the right midzone is felt to represent pleural calcifications, unchanged. Scarring in both lungs.  AICD in place. CABG. Clip on the left atrial appendage. No significant bone abnormality.  IMPRESSION: 1. Small residual left pleural effusion. Resolution of slight atelectasis at the lung bases. 2. No other significant change. Chronic pleural calcifications. Scarring in both lungs.   Electronically Signed   By: Lorriane Shire M.D.   On: 06/28/2019 15:30      Impression:  Overall he is doing well following coronary bypass surgery especially considering his degree of COPD and his preoperative reduced ejection fraction  of 30 to 35%.  He was started on midodrine postoperatively for hypertension and this has been weaned to 5 mg 3 times daily.  His blood pressure is fine today and I told him he could discontinue that.  He was not started on a beta-blocker due to hypotension and severe COPD.  He has a history of paroxysmal atrial fibrillation and his most recent EKG that he brought with him today on 06/14/2019 shows that he is in atrial fibrillation with a controlled rate of 79 bpm.  Cardiology wanted to make a decision about possible cardioversion in the future if he does not return to sinus rhythm on his own.  He remains on oxygen for severe COPD and postoperative atelectasis.  He is down to 1 L but said that his oxygen  saturations dropped down to 70s when walking off oxygen.  He may eventually be able to get weaned off oxygen as he recovers. I encouraged him to continue ambulating as much as possible and to use his incentive spirometer.  Plan:  He is going to set up an appointment to see Dr. Katrinka Blazing for follow-up.  He also goes to the [facility] for cardiology follow-up to get his medications.  He will return to see me if he has any problems with his incisions.   [name], MD [facility] [phone]

## 2019-07-04 NOTE — Progress Notes (Deleted)
CARDIOLOGY OFFICE NOTE  Date:  07/04/2019    Max Rivas Date of Birth: 1950-08-06 Medical Record #[id number]  PCP:  Anson Fret, MD  Cardiologist:  Kyra Manges  No chief complaint on file.   History of Present Illness: Max Rivas is a 69 y.o. male who presents today for a follow up visit. Seen for Dr. Katrinka Blazing and Dr. Ladona Ridgel.   He has a history of known CAD with prior multiple MIs and PCIs - last in in 2004 at [facility], CHF with an ICD in place, AF - on anticoagulation with Eliquis and was previously on amiodarone, HLD, OSA - intolerant of CPAP, and COPD.   Last seen in March of 2019 by Dr. Katrinka Blazing and Dr. Ladona Ridgel in June of 2019.   He has had 3 prior admissions this year - Presented in March with multiple recurrent shocks as well as chest pain. Placed back on amiodarone. Recurrent shock in early May - He underwent a left heart catheterization which showed in-stent restenosis of the circumflex stent, and this was thought to be the cause of his ischemic arrhythmias.There was LAD and DX disease as well.  TCTS was consulted to discuss plans for surgical revascularization. He was told to not drive for 6 months.   Initially the patient did not wish to proceed with surgical revascularization. He wanted a second opinion. He was continued on Amiodarone that had been recently resumed and had Ranexa 500 mg bid added by cardiology and ultimately discharged.  Was then re admitted on 05/09 as he had further chest pain. After further discussion, he then opted for coronary artery bypass grafting surgery. Pre operative carotid duplex US showed no significant internal carotid artery stenosis bilaterally. He underwent a CABG x 3 and LA clip on 05/23/2019 by Dr. Laneta Simmers.   Post op course was uneventful. He did have AF with RVR - digoxin was added. Discharged in AF. Placed back on Eliquis and amiodarone. Had expected volume overload and blood loss anemia. Required  antibiotics for H flu on sputum culture. Did require home oxygen. He was orthostatic and required Midodrine. Ranexa was stopped.   I then saw him for follow up - he was not smoking. He was seeing the [place] the following day and told me that the [place] was going to change out his ICD - at [facility]. Had had one shock since discharge as well. He was having audible alerts. He was not driving. It seemed that he was going to continue with his care at the [place] - this was ok with [place] to transition his total care there.   The patient {does/does not:200015} have symptoms concerning for COVID-19 infection (fever, chills, cough, or new shortness of breath).   Comes in today. Here with   Past Medical History:  Diagnosis Date  . Arthritis    "elbows, knees" (02/24/2016)  . Bronchitis 2006  . CHF (congestive heart failure) (HCC)   . COPD (chronic obstructive pulmonary disease) (HCC)   . Coronary artery disease   . GERD (gastroesophageal reflux disease)   . High cholesterol   . Myocardial infarction [facility]) ~ 1995 X 2;1998; 2000; 2004  . On home oxygen therapy    "2L w/activity" (02/24/2016)  . Pacemaker   . Pulmonary embolism (HCC) 02/24/2016  . Sleep apnea    "have CPAP; can't tolerate it" (02/24/2016)  . Unstable angina (HCC) 05/21/2019    Past Surgical History:  Procedure Laterality Date  . CLIPPING  OF ATRIAL APPENDAGE N/A 05/23/2019   Procedure: CLIPPING OF ATRIAL APPENDAGE with atriclip;  Surgeon: [name], MD;  Location: [facility] OR;  Service: Open Heart Surgery;  Laterality: N/A;  . CORONARY ANGIOPLASTY  1995  . CORONARY ANGIOPLASTY WITH STENT PLACEMENT  ~ 1995 - 2004 X 5   "I've got a total of 5 stents" (02/24/2016)  . CORONARY ARTERY BYPASS GRAFT N/A 05/23/2019   Procedure: CORONARY ARTERY BYPASS GRAFTING (CABG), times three, using right greater saphenous vein and left internal mammary;  Surgeon: [name], MD;  Location: [facility] OR;  Service: Open Heart Surgery;  Laterality: N/A;  [facility] only  . INSERT /  REPLACE / REMOVE PACEMAKER  07/2003   original PPM around 2004 for ICM with EF < 35%  . INTRAVASCULAR PRESSURE WIRE/FFR STUDY N/A 05/12/2019   Procedure: INTRAVASCULAR PRESSURE WIRE/FFR STUDY;  Surgeon: Lyn Records, MD;  [facility] INVASIVE CV LAB;  Service: Cardiovascular;  Laterality: N/A;  . LEFT HEART CATH AND CORONARY ANGIOGRAPHY N/A 05/12/2019   Procedure: LEFT HEART CATH AND CORONARY ANGIOGRAPHY;  Surgeon: Lyn Records, MD;  Location: [facility] INVASIVE CV LAB;  Service: Cardiovascular;  Laterality: N/A;  . PACEMAKER GENER[facility] CH[facility]  [facility]  . TEE WITHOUT CARDIOVERSION N/A 05/23/2019   Procedure: TRANSESOPHAGEAL ECHOCARDIOGRAM (TEE);  Surgeon: [name], MD;  Location[facility] OR;  Service: Open Heart Surgery;  Laterality: N/A;  . TONSILLECTOMY       Medications: No outpatient medications have been marked as taking for the 07/12/19 encounter (Appointment) with Max Macadamia, NP.     Allergies: Allergies  Allergen Reactions  . Crestor [Rosuvastatin Calcium] Other (See Comments)    Back spasms, but can tolerate it at a low dose now    Social History: The patient  reports that he has been smoking cigarettes. He has a 105.00 pack-year smoking history. He has never used smokeless tobacco. He reports current alcohol use. He reports previous drug use. Drug: Cocaine.   Family History: The patient's ***family history includes Heart attack in his father and mother; Heart attack (age of onset: 28) in his sister.   Review of Systems: Please see the history of present illness.   All other systems are reviewed and negative.   Physical Exam: VS:  There were no vitals taken for this visit. Marland Kitchen  BMI There is no height or weight on file to calculate BMI.  Wt Readings from Last 3 Encounters:  06/28/19 234 lb (106.1 kg)  06/16/19 253 lb 8.5 oz (115 kg)  06/14/19 234 lb 12.8 oz (106.5 kg)    General: Pleasant. Well developed, well nourished and in no acute distress.     HEENT: Normal.  Neck: Supple, no JVD, carotid bruits, or masses noted.  Cardiac: ***Regular rate and rhythm. No murmurs, rubs, or gallops. No edema.  Respiratory:  Lungs are clear to auscultation bilaterally with normal work of breathing.  GI: Soft and nontender.  MS: No deformity or atrophy. Gait and ROM intact.  Skin: Warm and dry. Color is normal.  Neuro:  Strength and sensation are intact and no gross focal deficits noted.  Psych: Alert, appropriate and with normal affect.   LABORATORY DATA:  EKG:  EKG {ACTION; IS/IS [id number]} ordered today.  Personally reviewed by me. This demonstrates ***.  Lab Results  Component Value Date   WBC 6.0 06/16/2019   HGB 13.3 06/16/2019   HCT 42.2 06/16/2019   PLT 210 06/16/2019  GLUCOSE 117 (H) 06/16/2019   CHOL 126 05/13/2019   TRIG 101 05/13/2019   HDL 36 (L) 05/13/2019   LDLCALC 70 05/13/2019   ALT 23 06/16/2019   AST 33 06/16/2019   NA 141 06/16/2019   K 5.0 06/16/2019   CL 95 (L) 06/16/2019   CREATININE 1.28 (H) 06/16/2019   BUN 9 06/16/2019   CO2 32 06/16/2019   TSH 0.321 (L) 05/25/2019   INR 1.3 (H) 05/23/2019   HGBA1C 6.3 (H) 05/23/2019     BNP (last 3 results) Recent Labs    05/21/19 0526  BNP 122.3*    ProBNP (last 3 results) No results for input(s): PROBNP in the last 8760 hours.   Other Studies Reviewed Today:  Procedure (s) 05/2019: 1. Median Sternotomy 2. Extracorporeal circulation 3. Coronary artery bypass grafting x 3   Left internal mammary artery graft to the LAD  SVG to diagonal  SVG to OM  4. Endoscopic vein harvest from the rightleg 5. Clipping of left atrial appendageby Dr. Cyndia Bent on 05/23/2019.   Left heart catheterization 05/12/19:  Recurrent, probably ischemically mediated ventricular tachycardia with multiple shocks.  High-grade native and diffuse in-stent restenosis ostial to mid circumflex 95 to 99%. There is also a 90% stenosis beyond the stented  segment.  Widely patent left main  Eccentric bulky and calcified 50 to 65% proximal to mid LAD beyond the origin of the large first diagonal. Eccentric bulky and calcified ostial to proximal 70% first diagonal. Both LAD and diagonal are mildly hemodynamically significant based upon RFR values 0.89 and 0.87, respectively. RFR 0.89 or less is hemodynamically significant.  The right coronary particularly in the mid segment contains diffuse atherosclerosis but no focal high-grade obstruction.  Left ventriculography is not performed because of renal insufficiency. LVEDP is normal measuring 10 mmHg.  RECOMMENDATIONS:   We need to determine if the patient has any surgical options. The likelihood of durable circumflex result is low due to the diffuse nature of disease and the need to place stents over a long segment, most of which would be stent sandwich. The LAD and diagonal are mildly hemodynamically significant and could be grafted.  If PCI, will need orbital atherectomy over a long segment then reexpansion of the in-stent restenosis and if resistant, possibly shockwave PCI followed by extensive ostial to mid vessel restenting.  Echocardiogram 05/13/19: 1. Akinesis of the inferior and inferolateral walls with overall mild to  moderate LV dysfunction; grade 2 diastolic dysfunction; mild MR; mild LAE.  2. Left ventricular ejection fraction, by estimation, is 40 to 45%. The  left ventricle has mildly decreased function. The left ventricle  demonstrates regional wall motion abnormalities (see scoring  diagram/findings for description). The left ventricular  internal cavity size was moderately dilated. Left ventricular diastolic  parameters are consistent with Grade II diastolic dysfunction  (pseudonormalization).  3. Right ventricular systolic function is normal. The right ventricular  size is normal.  4. Left atrial size was mildly dilated.  5. The mitral valve is normal in  structure. Mild mitral valve  regurgitation. No evidence of mitral stenosis.  6. The aortic valve has an indeterminant number of cusps. Aortic valve  regurgitation is not visualized. No aortic stenosis is present.  7. The inferior vena cava is normal in size with greater than 50%  respiratory variability, suggesting right atrial pressure of 3 mmHg.     ASSESSMENT & PLAN:   1. CAD - recent multiple shocks due to ischemia - now s/p CABG x  3 with LA clipping - slow but stable progress. Lab today.   2. Persistent AF - back on amiodarone. Has had LA clipping - remains on anticoagulation - could consider cardioversion at some point.   3. Chronic anticoagulation - he is on Eliquis.   4. Severe COPD - now on oxygen - seeing pulmonary thru the [place] tomorrow.   5. Post op blood loss anemia - recheck lab today.   6. Sputum with Hflu - has finished his antibiotics.    7. HLD - on high intensity statin therapy.   8. Ischemic CM with chronic systolic HF - EF 40 to 45% - not on ideal therapy - requiring Midodrine for BP support.   9. High risk medicine - lab today.   10. Underlying ICD - will need to get back with EP here. He has had prior multiple shocks - he is not to drive for 6 months. He tells me towards the end of the visit that he was shocked on Sunday, that the [place] is planning on generator change out later this month, that he sees EP/cards/pulmonary there. Overall situation is tenuous.   Discussed with Dr. Katrinka Blazing - we will send our records to the [facility] for his visit for tomorrow. Lab here today. Device is followed thru the [place] per patient's report. It may be in Mr. Legere's best interest to have all of his care thru the [place] in order to maintain continuity. We need definitive assurance that he has all this in place thru the [place].   Current medicines are reviewed with the patient today.  The patient does not have concerns regarding medicines other than what has been noted above.  The  following changes have been made:  See above.  Labs/ tests ordered today include:   No orders of the defined types were placed in this encounter.    Disposition:   FU with *** in {gen number 8-67:619509} {Days to years:10300}.   Patient is agreeable to this plan and will call if any problems develop in the interim.   SignedNorma Fredrickson, NP  07/04/2019 1:01 PM  [facility] [address] [place], [place]  [address] Phone: [phone] Fax: [phone]

## 2019-07-12 ENCOUNTER — Ambulatory Visit: Payer: Medicare Other | Admitting: Nurse Practitioner

## 2019-08-28 ENCOUNTER — Emergency Department (HOSPITAL_COMMUNITY)
Admission: EM | Admit: 2019-08-28 | Discharge: 2019-08-28 | Disposition: A | Discharge status: Home / Self Care | Payer: No Typology Code available for payment source | Attending: Emergency Medicine | Admitting: Emergency Medicine

## 2019-08-28 ENCOUNTER — Telehealth: Payer: Self-pay

## 2019-08-28 ENCOUNTER — Other Ambulatory Visit: Payer: Self-pay

## 2019-08-28 ENCOUNTER — Encounter (HOSPITAL_COMMUNITY): Payer: Self-pay | Admitting: Emergency Medicine

## 2019-08-28 ENCOUNTER — Emergency Department (HOSPITAL_COMMUNITY): Payer: No Typology Code available for payment source

## 2019-08-28 DIAGNOSIS — Z5321 Procedure and treatment not carried out due to patient leaving prior to being seen by health care provider: Secondary | ICD-10-CM | POA: Insufficient documentation

## 2019-08-28 DIAGNOSIS — R0602 Shortness of breath: Secondary | ICD-10-CM | POA: Diagnosis present

## 2019-08-28 DIAGNOSIS — R079 Chest pain, unspecified: Secondary | ICD-10-CM | POA: Insufficient documentation

## 2019-08-28 LAB — CBC
HCT: 45.1 % (ref 39.0–52.0)
Hemoglobin: 14 g/dL (ref 13.0–17.0)
MCH: 28.2 pg (ref 26.0–34.0)
MCHC: 31 g/dL (ref 30.0–36.0)
MCV: 90.9 fL (ref 80.0–100.0)
Platelets: 187 10*3/uL (ref 150–400)
RBC: 4.96 MIL/uL (ref 4.22–5.81)
RDW: 14.5 % (ref 11.5–15.5)
WBC: 11.4 10*3/uL — ABNORMAL HIGH (ref 4.0–10.5)
nRBC: 0 % (ref 0.0–0.2)

## 2019-08-28 LAB — BASIC METABOLIC PANEL
Anion gap: 15 (ref 5–15)
BUN: 11 mg/dL (ref 8–23)
CO2: 21 mmol/L — ABNORMAL LOW (ref 22–32)
Calcium: 8.9 mg/dL (ref 8.9–10.3)
Chloride: 102 mmol/L (ref 98–111)
Creatinine, Ser: 1.27 mg/dL — ABNORMAL HIGH (ref 0.61–1.24)
GFR calc Af Amer: >60 mL/min (ref >60)
GFR calc non Af Amer: 58 mL/min — ABNORMAL LOW (ref >60)
Glucose, Bld: 123 mg/dL — ABNORMAL HIGH (ref 70–99)
Potassium: 3.2 mmol/L — ABNORMAL LOW (ref 3.5–5.1)
Sodium: 138 mmol/L (ref 135–145)

## 2019-08-28 LAB — TROPONIN I (HIGH SENSITIVITY): Troponin I (High Sensitivity): 15 ng/L (ref <18)

## 2019-08-28 NOTE — Telephone Encounter (Signed)
NOTES ON FILE FROM [facility] [phone] EXT [phone]. SENT REFERRAL TO SCHEDULING 

## 2019-08-28 NOTE — ED Triage Notes (Signed)
Pt BIB [facility] from home, c/o increased shortness of breath and chest pain that has resolved. Per EMS, pt in afib with a rate of 70-100, had to increase his O2  From 2L to 4L [place]. Pt reports his pacemaker fired twice 3 days ago.

## 2019-09-01 ENCOUNTER — Other Ambulatory Visit: Payer: Self-pay

## 2019-09-01 ENCOUNTER — Encounter: Payer: Self-pay | Admitting: Internal Medicine

## 2019-09-01 ENCOUNTER — Ambulatory Visit (INDEPENDENT_AMBULATORY_CARE_PROVIDER_SITE_OTHER): Payer: No Typology Code available for payment source | Admitting: Internal Medicine

## 2019-09-01 VITALS — BP 114/68 | HR 50 | Ht 71.0 in | Wt 224.0 lb

## 2019-09-01 DIAGNOSIS — I5042 Chronic combined systolic (congestive) and diastolic (congestive) heart failure: Secondary | ICD-10-CM | POA: Diagnosis not present

## 2019-09-01 DIAGNOSIS — I482 Chronic atrial fibrillation, unspecified: Secondary | ICD-10-CM

## 2019-09-01 DIAGNOSIS — I472 Ventricular tachycardia, unspecified: Secondary | ICD-10-CM

## 2019-09-01 DIAGNOSIS — Z95810 Presence of automatic (implantable) cardiac defibrillator: Secondary | ICD-10-CM

## 2019-09-01 NOTE — Progress Notes (Signed)
HPI Max Rivas returns today for followup. He is a pleasant 69 yo man with PAF, VT, an ICM, s/p recent CABG. He has had recurrent VT and ICD therapies. He had his initial ICD placed in 2005 but his 6949 lead broke and was replaced with a 6947 lead at the [place]. I saw him last 2 years ago. Prior to than I had not seen him in many years. He is pending a scheduled VT ablation. He has had essentially all of his  ICD followup at the [place]. He describes an episode back in June when he probably went back into atrial fib. He got sob and his heart was racing and oxygen level dropped into the 60's. He realized that his oxygen was turned off. He felt poorly ever since then. He was noted to have had VT at over 200/min. He has had ICD shocks. He remains on amiodarone 200 mg daily.  Allergies  Allergen Reactions  . Crestor [Rosuvastatin Calcium] Other (See Comments)    Back spasms, but can tolerate it at a low dose now     Current Outpatient Medications  Medication Sig Dispense Refill  . acetaminophen (TYLENOL) 325 MG tablet Take 2 tablets (650 mg total) by mouth every 6 (six) hours as needed for mild pain (or Fever >/= 101). 30 tablet 0  . amiodarone (PACERONE) 200 MG tablet Take 1 tablet (200 mg total) by mouth daily. 30 tablet 1  . apixaban (ELIQUIS) 5 MG TABS tablet Take 5 mg by mouth 2 (two) times daily.    Marland Kitchen aspirin 81 MG chewable tablet Chew 1 tablet (81 mg total) by mouth daily. 90 tablet 3  . atorvastatin (LIPITOR) 80 MG tablet Take 1 tablet (80 mg total) by mouth daily. 90 tablet 3  . carboxymethylcellulose (REFRESH PLUS) 0.5 % SOLN Place 1 drop into both eyes at bedtime.    . Cholecalciferol (VITAMIN D3) 50 MCG (2000 UT) TABS Take 2,000 Units by mouth daily.    Marland Kitchen dextromethorphan-guaiFENesin (MUCINEX DM) 30-600 MG 12hr tablet Take 1 tablet by mouth 2 (two) times daily as needed for cough.    . digoxin (LANOXIN) 0.125 MG tablet Take 0.5 tablets (0.0625 mg total) by mouth daily. 30 tablet 3  .  furosemide (LASIX) 40 MG tablet Take 40 mg by mouth 2 (two) times daily.    Marland Kitchen gabapentin (NEURONTIN) 100 MG capsule Take 2 capsules (200 mg total) by mouth 2 (two) times daily. 60 capsule 1  . levalbuterol (XOPENEX HFA) 45 MCG/ACT inhaler Inhale 2 puffs into the lungs every 6 (six) hours as needed for wheezing or shortness of breath.    . levothyroxine (SYNTHROID) 75 MCG tablet Take 75 mcg by mouth daily before breakfast.    . midodrine (PROAMATINE) 5 MG tablet Take 1 tablet (5 mg total) by mouth 3 (three) times daily with meals. 90 tablet 1  . mometasone (ASMANEX) 220 MCG/INH inhaler Inhale 2 puffs into the lungs 2 (two) times daily.     . montelukast (SINGULAIR) 10 MG tablet Take 10 mg by mouth 2 (two) times daily.     . Multiple Vitamin (MULTIVITAMIN WITH MINERALS) TABS tablet Take 1 tablet by mouth daily.    . nitroGLYCERIN (NITROSTAT) 0.4 MG SL tablet Place 0.4 mg under the tongue every 5 (five) minutes as needed for chest pain.     Marland Kitchen omega-3 acid ethyl esters (LOVAZA) 1 g capsule Take 1 capsule (1 g total) by mouth 2 (two) times daily.  60 capsule 0  . omeprazole (PRILOSEC) 20 MG capsule Take 20 mg by mouth 2 (two) times daily before a meal.    . Propylene Glycol (SYSTANE BALANCE) 0.6 % SOLN Place 1 drop into both eyes See admin instructions. Place 1 drop into each eye four times a day and apply a warm compress for 10 minutes afterwards    . Tiotropium Bromide-Olodaterol (STIOLTO RESPIMAT) 2.5-2.5 MCG/ACT AERS Inhale 2 puffs into the lungs daily.     . traMADol (ULTRAM) 50 MG tablet Take 1 tablet (50 mg total) by mouth every 4 (four) hours as needed for moderate pain. (Patient taking differently: Take 50 mg by mouth every 4 (four) hours as needed for severe pain. ) 30 tablet 0   No current facility-administered medications for this visit.     Past Medical History:  Diagnosis Date  . Arthritis    "elbows, knees" (02/24/2016)  . Bronchitis 2006  . CHF (congestive heart failure) (HCC)   .  COPD (chronic obstructive pulmonary disease) (HCC)   . Coronary artery disease   . GERD (gastroesophageal reflux disease)   . High cholesterol   . Myocardial infarction [facility]) ~ 1995 X 2;1998; 2000; 2004  . On home oxygen therapy    "2L w/activity" (02/24/2016)  . Pacemaker   . Pulmonary embolism (HCC) 02/24/2016  . Sleep apnea    "have CPAP; can't tolerate it" (02/24/2016)  . Unstable angina (HCC) 05/21/2019    ROS:   All systems reviewed and negative except as noted in the HPI.   Past Surgical History:  Procedure Laterality Date  . CLIPPING OF ATRIAL APPENDAGE N/A 05/23/2019   Procedure: CLIPPING OF ATRIAL APPENDAGE with atriclip;  Surgeon: [name], MD;  Location: [facility] OR;  Service: Open Heart Surgery;  Laterality: N/A;  . CORONARY ANGIOPLASTY  1995  . CORONARY ANGIOPLASTY WITH STENT PLACEMENT  ~ 1995 - 2004 X 5   "I've got a total of 5 stents" (02/24/2016)  . CORONARY ARTERY BYPASS GRAFT N/A 05/23/2019   Procedure: CORONARY ARTERY BYPASS GRAFTING (CABG), times three, using right greater saphenous vein and left internal mammary;  Surgeon: [name], MD;  Location: [facility] OR;  Service: Open Heart Surgery;  Laterality: N/A;  [facility] only  . 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 INTRAVASCULAR PRESSURE WIRE/FFR STUDY N/A 05/12/2019   Procedure: INTRAVASCULAR PRESSURE WIRE/FFR STUDY;  Surgeon: Lyn Records, MD;  [facility] INVASIVE CV LAB;  Service: Cardiovascular;  Laterality: N/A;  . LEFT HEART CATH AND CORONARY ANGIOGRAPHY N/A 05/12/2019   Procedure: LEFT HEART CATH AND CORONARY ANGIOGRAPHY;  Surgeon: Lyn Records, MD;  [facility] INVASIVE CV LAB;  Service: Cardiovascular;  Laterality: N/A;  . PACEMAKER GENERATOR [facility]  [facility]  . TEE WITHOUT CARDIOVERSION N/A 05/23/2019   Procedure: TRANSESOPHAGEAL ECHOCARDIOGRAM (TEE);  Surgeon: [name], MD;  Location: [facility] OR;  Service: Open Heart Surgery;   Laterality: N/A;  . TONSILLECTOMY       Family History  Problem Relation Age of Onset  . Heart attack Mother   . Heart attack Father   . Heart attack Sister 28     Social History   Socioeconomic History  . Marital status: Divorced    Spouse name: Not on file  . Number of children: Not on file  . Years of education: Not on file  . Highest education  level: Not on file  Occupational History  . Not on file  Tobacco Use  . Smoking status: Current Some Day Smoker    Packs/day: 3.00    Years: 35.00    Pack years: 105.00    Types: Cigarettes  . Smokeless tobacco: Never Used  . Tobacco comment: 02/24/2016 "stopped ~ 8-9 months ago; have had 5 cigarettes in the last week"  Vaping Use  . Vaping Use: Never used  Substance and Sexual Activity  . Alcohol use: Yes    Comment: 02/24/2016 "none in a long time"  . Drug use: Not Currently    Types: Cocaine    Comment: 02/24/2016 "none since 1995"  . Sexual activity: Yes  Other Topics Concern  . Not on file  Social History Narrative  . Not on file   Social Determinants of Health   Financial Resource Strain:   . Difficulty of Paying Living Expenses: Not on file  Food Insecurity:   . Worried About Programme researcher, broadcasting/film/video in the Last Year: Not on file  . Ran Out of Food in the Last Year: Not on file  Transportation Needs:   . Lack of Transportation (Medical): Not on file  . Lack of Transportation (Non-Medical): Not on file  Physical Activity:   . Days of Exercise per Week: Not on file  . Minutes of Exercise per Session: Not on file  Stress:   . Feeling of Stress : Not on file  Social Connections:   . Frequency of Communication with Friends and Family: Not on file  . Frequency of Social Gatherings with Friends and Family: Not on file  . Attends Religious Services: Not on file  . Active Member of Clubs or Organizations: Not on file  . Attends Banker Meetings: Not on file  . Marital Status: Not on file  Intimate  Partner Violence:   . Fear of Current or Ex-Partner: Not on file  . Emotionally Abused: Not on file  . Physically Abused: Not on file  . Sexually Abused: Not on file     BP 114/68   Pulse (!) 50   Ht 5\' 11"  (1.803 m)   Wt 224 lb (101.6 kg)   SpO2 (!) 73%   BMI 31.24 kg/m   Physical Exam:  Well appearing NAD HEENT: Unremarkable Neck:  No JVD, no thyromegally Lymphatics:  No adenopathy Back:  No CVA tenderness Lungs:  Clear HEART:  Regular rate rhythm, no murmurs, no rubs, no clicks Abd:  soft, positive bowel sounds, no organomegally, no rebound, no guarding Ext:  2 plus pulses, no edema, no cyanosis, no clubbing Skin:  No rashes no nodules Neuro:  CN II through XII intact, motor grossly intact   DEVICE  Normal device function.  See PaceArt for details.   Assess/Plan: 1. Atrial fib - I think that this is his biggest problem. I would recommend increasing the amiodarone to 200 mg twice daily and have him undergo DCCV in September, prior to his scheduled VT ablation.  2. VT - he has had a couple of episodes of VT at 200/min. His device successfully shocked him back to atrial fib.  3. ICM - he is s/p CABG with no angina. 4. ICD - his medtronic device is approaching ERI. He plans to [facility].   October.

## 2019-09-01 NOTE — Patient Instructions (Addendum)
Medication Instructions:  Your physician recommends that you continue on your current medications as directed. Please refer to the Current Medication list given to you today.  Labwork: None ordered.  Testing/Procedures: None ordered.  Follow-Up: Your physician wants you to follow-up in: as needed with Dr. Taylor.      Any Other Special Instructions Will Be Listed Below (If Applicable).  If you need a refill on your cardiac medications before your next appointment, please call your pharmacy.   

## 2019-09-08 ENCOUNTER — Other Ambulatory Visit: Payer: Self-pay

## 2019-09-08 ENCOUNTER — Inpatient Hospital Stay (HOSPITAL_COMMUNITY)
Admission: EM | Admit: 2019-09-08 | Discharge: 2019-09-11 | DRG: 291 | Disposition: A | Discharge status: Home / Self Care | Payer: No Typology Code available for payment source | Attending: Internal Medicine | Admitting: Internal Medicine

## 2019-09-08 ENCOUNTER — Encounter (HOSPITAL_COMMUNITY): Payer: Self-pay | Admitting: Pharmacy Technician

## 2019-09-08 ENCOUNTER — Emergency Department (HOSPITAL_COMMUNITY): Payer: No Typology Code available for payment source

## 2019-09-08 DIAGNOSIS — I130 Hypertensive heart and chronic kidney disease with heart failure and stage 1 through stage 4 chronic kidney disease, or unspecified chronic kidney disease: Principal | ICD-10-CM | POA: Diagnosis present

## 2019-09-08 DIAGNOSIS — I509 Heart failure, unspecified: Secondary | ICD-10-CM

## 2019-09-08 DIAGNOSIS — I2510 Atherosclerotic heart disease of native coronary artery without angina pectoris: Secondary | ICD-10-CM | POA: Diagnosis present

## 2019-09-08 DIAGNOSIS — Z7982 Long term (current) use of aspirin: Secondary | ICD-10-CM

## 2019-09-08 DIAGNOSIS — N1831 Chronic kidney disease, stage 3a: Secondary | ICD-10-CM | POA: Diagnosis present

## 2019-09-08 DIAGNOSIS — R0902 Hypoxemia: Secondary | ICD-10-CM | POA: Diagnosis not present

## 2019-09-08 DIAGNOSIS — E785 Hyperlipidemia, unspecified: Secondary | ICD-10-CM | POA: Diagnosis not present

## 2019-09-08 DIAGNOSIS — R634 Abnormal weight loss: Secondary | ICD-10-CM | POA: Diagnosis present

## 2019-09-08 DIAGNOSIS — E039 Hypothyroidism, unspecified: Secondary | ICD-10-CM | POA: Diagnosis not present

## 2019-09-08 DIAGNOSIS — E876 Hypokalemia: Secondary | ICD-10-CM | POA: Diagnosis present

## 2019-09-08 DIAGNOSIS — Z8249 Family history of ischemic heart disease and other diseases of the circulatory system: Secondary | ICD-10-CM

## 2019-09-08 DIAGNOSIS — Z86711 Personal history of pulmonary embolism: Secondary | ICD-10-CM | POA: Diagnosis not present

## 2019-09-08 DIAGNOSIS — I482 Chronic atrial fibrillation, unspecified: Secondary | ICD-10-CM | POA: Diagnosis not present

## 2019-09-08 DIAGNOSIS — I420 Dilated cardiomyopathy: Secondary | ICD-10-CM | POA: Diagnosis present

## 2019-09-08 DIAGNOSIS — F17210 Nicotine dependence, cigarettes, uncomplicated: Secondary | ICD-10-CM | POA: Diagnosis present

## 2019-09-08 DIAGNOSIS — I5043 Acute on chronic combined systolic (congestive) and diastolic (congestive) heart failure: Secondary | ICD-10-CM | POA: Diagnosis present

## 2019-09-08 DIAGNOSIS — J449 Chronic obstructive pulmonary disease, unspecified: Secondary | ICD-10-CM | POA: Diagnosis present

## 2019-09-08 DIAGNOSIS — I255 Ischemic cardiomyopathy: Secondary | ICD-10-CM | POA: Diagnosis not present

## 2019-09-08 DIAGNOSIS — Z20822 Contact with and (suspected) exposure to covid-19: Secondary | ICD-10-CM | POA: Diagnosis present

## 2019-09-08 DIAGNOSIS — R55 Syncope and collapse: Secondary | ICD-10-CM | POA: Diagnosis not present

## 2019-09-08 DIAGNOSIS — Z66 Do not resuscitate: Secondary | ICD-10-CM | POA: Diagnosis present

## 2019-09-08 DIAGNOSIS — J9601 Acute respiratory failure with hypoxia: Secondary | ICD-10-CM | POA: Diagnosis not present

## 2019-09-08 DIAGNOSIS — I472 Ventricular tachycardia, unspecified: Secondary | ICD-10-CM

## 2019-09-08 DIAGNOSIS — I480 Paroxysmal atrial fibrillation: Secondary | ICD-10-CM

## 2019-09-08 DIAGNOSIS — Z6831 Body mass index (BMI) 31.0-31.9, adult: Secondary | ICD-10-CM

## 2019-09-08 DIAGNOSIS — Z9981 Dependence on supplemental oxygen: Secondary | ICD-10-CM | POA: Diagnosis not present

## 2019-09-08 DIAGNOSIS — Z951 Presence of aortocoronary bypass graft: Secondary | ICD-10-CM

## 2019-09-08 DIAGNOSIS — J9602 Acute respiratory failure with hypercapnia: Secondary | ICD-10-CM | POA: Diagnosis present

## 2019-09-08 DIAGNOSIS — N179 Acute kidney failure, unspecified: Secondary | ICD-10-CM | POA: Diagnosis present

## 2019-09-08 DIAGNOSIS — I4819 Other persistent atrial fibrillation: Secondary | ICD-10-CM | POA: Diagnosis present

## 2019-09-08 DIAGNOSIS — I361 Nonrheumatic tricuspid (valve) insufficiency: Secondary | ICD-10-CM | POA: Diagnosis not present

## 2019-09-08 DIAGNOSIS — Z7901 Long term (current) use of anticoagulants: Secondary | ICD-10-CM

## 2019-09-08 DIAGNOSIS — Z955 Presence of coronary angioplasty implant and graft: Secondary | ICD-10-CM

## 2019-09-08 DIAGNOSIS — I5023 Acute on chronic systolic (congestive) heart failure: Secondary | ICD-10-CM | POA: Diagnosis present

## 2019-09-08 DIAGNOSIS — J9621 Acute and chronic respiratory failure with hypoxia: Secondary | ICD-10-CM | POA: Diagnosis present

## 2019-09-08 DIAGNOSIS — K219 Gastro-esophageal reflux disease without esophagitis: Secondary | ICD-10-CM | POA: Diagnosis present

## 2019-09-08 DIAGNOSIS — E669 Obesity, unspecified: Secondary | ICD-10-CM | POA: Diagnosis present

## 2019-09-08 DIAGNOSIS — Z95810 Presence of automatic (implantable) cardiac defibrillator: Secondary | ICD-10-CM | POA: Diagnosis not present

## 2019-09-08 DIAGNOSIS — I252 Old myocardial infarction: Secondary | ICD-10-CM

## 2019-09-08 DIAGNOSIS — M1990 Unspecified osteoarthritis, unspecified site: Secondary | ICD-10-CM | POA: Diagnosis present

## 2019-09-08 DIAGNOSIS — Z79899 Other long term (current) drug therapy: Secondary | ICD-10-CM

## 2019-09-08 DIAGNOSIS — I493 Ventricular premature depolarization: Secondary | ICD-10-CM | POA: Diagnosis present

## 2019-09-08 DIAGNOSIS — J90 Pleural effusion, not elsewhere classified: Secondary | ICD-10-CM

## 2019-09-08 DIAGNOSIS — G4733 Obstructive sleep apnea (adult) (pediatric): Secondary | ICD-10-CM | POA: Diagnosis present

## 2019-09-08 DIAGNOSIS — Z833 Family history of diabetes mellitus: Secondary | ICD-10-CM

## 2019-09-08 DIAGNOSIS — Z79890 Hormone replacement therapy (postmenopausal): Secondary | ICD-10-CM

## 2019-09-08 DIAGNOSIS — I340 Nonrheumatic mitral (valve) insufficiency: Secondary | ICD-10-CM | POA: Diagnosis not present

## 2019-09-08 HISTORY — DX: Ischemic cardiomyopathy: I25.5

## 2019-09-08 HISTORY — DX: Morbid (severe) obesity due to excess calories: E66.01

## 2019-09-08 HISTORY — DX: Paroxysmal atrial fibrillation: I48.0

## 2019-09-08 HISTORY — DX: Ventricular tachycardia: I47.2

## 2019-09-08 HISTORY — DX: Atherosclerotic heart disease of native coronary artery without angina pectoris: I25.10

## 2019-09-08 HISTORY — DX: Unspecified systolic (congestive) heart failure: I50.20

## 2019-09-08 HISTORY — DX: Ventricular tachycardia, unspecified: I47.20

## 2019-09-08 LAB — SARS CORONAVIRUS 2 BY RT PCR (HOSPITAL ORDER, PERFORMED IN ~~LOC~~ HOSPITAL LAB): SARS Coronavirus 2: NEGATIVE

## 2019-09-08 LAB — CBC WITH DIFFERENTIAL/PLATELET
Abs Immature Granulocytes: 0.03 10*3/uL (ref 0.00–0.07)
Basophils Absolute: 0.1 10*3/uL (ref 0.0–0.1)
Basophils Relative: 1 %
Eosinophils Absolute: 0.1 10*3/uL (ref 0.0–0.5)
Eosinophils Relative: 1 %
HCT: 44.1 % (ref 39.0–52.0)
Hemoglobin: 13.8 g/dL (ref 13.0–17.0)
Immature Granulocytes: 0 %
Lymphocytes Relative: 17 %
Lymphs Abs: 1.3 10*3/uL (ref 0.7–4.0)
MCH: 29.1 pg (ref 26.0–34.0)
MCHC: 31.3 g/dL (ref 30.0–36.0)
MCV: 92.8 fL (ref 80.0–100.0)
Monocytes Absolute: 0.7 10*3/uL (ref 0.1–1.0)
Monocytes Relative: 9 %
Neutro Abs: 5.7 10*3/uL (ref 1.7–7.7)
Neutrophils Relative %: 72 %
Platelets: 215 10*3/uL (ref 150–400)
RBC: 4.75 MIL/uL (ref 4.22–5.81)
RDW: 15.3 % (ref 11.5–15.5)
WBC: 7.8 10*3/uL (ref 4.0–10.5)
nRBC: 0 % (ref 0.0–0.2)

## 2019-09-08 LAB — TROPONIN I (HIGH SENSITIVITY)
Troponin I (High Sensitivity): 12 ng/L (ref <18)
Troponin I (High Sensitivity): 13 ng/L (ref <18)

## 2019-09-08 LAB — COMPREHENSIVE METABOLIC PANEL
ALT: 27 U/L (ref 0–44)
AST: 25 U/L (ref 15–41)
Albumin: 3.6 g/dL (ref 3.5–5.0)
Alkaline Phosphatase: 76 U/L (ref 38–126)
Anion gap: 11 (ref 5–15)
BUN: 17 mg/dL (ref 8–23)
CO2: 28 mmol/L (ref 22–32)
Calcium: 9.1 mg/dL (ref 8.9–10.3)
Chloride: 99 mmol/L (ref 98–111)
Creatinine, Ser: 1.61 mg/dL — ABNORMAL HIGH (ref 0.61–1.24)
GFR calc Af Amer: 51 mL/min — ABNORMAL LOW (ref >60)
GFR calc non Af Amer: 44 mL/min — ABNORMAL LOW (ref >60)
Glucose, Bld: 125 mg/dL — ABNORMAL HIGH (ref 70–99)
Potassium: 3.7 mmol/L (ref 3.5–5.1)
Sodium: 138 mmol/L (ref 135–145)
Total Bilirubin: 1.1 mg/dL (ref 0.3–1.2)
Total Protein: 6.8 g/dL (ref 6.5–8.1)

## 2019-09-08 LAB — LACTIC ACID, PLASMA
Lactic Acid, Venous: 2 mmol/L (ref 0.5–1.9)
Lactic Acid, Venous: 1 mmol/L (ref 0.5–1.9)

## 2019-09-08 LAB — TSH: TSH: 2.639 u[IU]/mL (ref 0.350–4.500)

## 2019-09-08 LAB — BRAIN NATRIURETIC PEPTIDE: B Natriuretic Peptide: 455 pg/mL — ABNORMAL HIGH (ref 0.0–100.0)

## 2019-09-08 LAB — DIGOXIN LEVEL: Digoxin Level: <0.2 ng/mL — ABNORMAL LOW (ref 0.8–2.0)

## 2019-09-08 LAB — HIV ANTIBODY (ROUTINE TESTING W REFLEX): HIV Screen 4th Generation wRfx: NONREACTIVE

## 2019-09-08 MED ORDER — METOPROLOL SUCCINATE ER 25 MG PO TB24
12.5000 mg | ORAL_TABLET | Freq: Every day | ORAL | Status: DC
  Administered 2019-09-09: 12.5 mg via ORAL
  Filled 2019-09-08: qty 1

## 2019-09-08 MED ORDER — LEVALBUTEROL HCL 0.63 MG/3ML IN NEBU: 1.2600 mg | INHALATION_SOLUTION | Freq: Four times a day (QID) | RESPIRATORY_TRACT | Status: DC | PRN

## 2019-09-08 MED ORDER — MEXILETINE HCL 150 MG PO CAPS
150.0000 mg | ORAL_CAPSULE | Freq: Two times a day (BID) | ORAL | Status: DC
  Administered 2019-09-08 – 2019-09-09 (×2): 150 mg via ORAL
  Filled 2019-09-08 (×2): qty 1

## 2019-09-08 MED ORDER — LEVOTHYROXINE SODIUM 75 MCG PO TABS
75.0000 ug | ORAL_TABLET | Freq: Every day | ORAL | Status: DC
  Administered 2019-09-09 – 2019-09-11 (×3): 75 ug via ORAL
  Filled 2019-09-08 (×3): qty 1

## 2019-09-08 MED ORDER — GABAPENTIN 100 MG PO CAPS
200.0000 mg | ORAL_CAPSULE | Freq: Two times a day (BID) | ORAL | Status: DC
  Administered 2019-09-08 – 2019-09-11 (×6): 200 mg via ORAL
  Filled 2019-09-08 (×6): qty 2

## 2019-09-08 MED ORDER — ASPIRIN 81 MG PO CHEW
81.0000 mg | CHEWABLE_TABLET | Freq: Every day | ORAL | Status: DC
  Administered 2019-09-09 – 2019-09-11 (×3): 81 mg via ORAL
  Filled 2019-09-08 (×4): qty 1

## 2019-09-08 MED ORDER — MONTELUKAST SODIUM 10 MG PO TABS
10.0000 mg | ORAL_TABLET | Freq: Every day | ORAL | Status: DC
  Administered 2019-09-09 – 2019-09-11 (×3): 10 mg via ORAL
  Filled 2019-09-08 (×3): qty 1

## 2019-09-08 MED ORDER — ATORVASTATIN CALCIUM 80 MG PO TABS
80.0000 mg | ORAL_TABLET | Freq: Every day | ORAL | Status: DC
  Administered 2019-09-08 – 2019-09-10 (×3): 80 mg via ORAL
  Filled 2019-09-08 (×3): qty 1

## 2019-09-08 MED ORDER — SODIUM CHLORIDE 0.9% FLUSH
3.0000 mL | Freq: Two times a day (BID) | INTRAVENOUS | Status: DC
  Administered 2019-09-09: 3 mL via INTRAVENOUS
  Administered 2019-09-09: 6 mL via INTRAVENOUS
  Administered 2019-09-09 – 2019-09-11 (×4): 3 mL via INTRAVENOUS

## 2019-09-08 MED ORDER — BUDESONIDE 0.5 MG/2ML IN SUSP
0.5000 mg | Freq: Two times a day (BID) | RESPIRATORY_TRACT | Status: DC
  Administered 2019-09-09 – 2019-09-10 (×5): 0.5 mg via RESPIRATORY_TRACT
  Filled 2019-09-08 (×6): qty 2

## 2019-09-08 MED ORDER — ACETAMINOPHEN 325 MG PO TABS: 650.0000 mg | ORAL_TABLET | ORAL | Status: DC | PRN

## 2019-09-08 MED ORDER — APIXABAN 5 MG PO TABS
5.0000 mg | ORAL_TABLET | Freq: Two times a day (BID) | ORAL | Status: DC
  Administered 2019-09-08 – 2019-09-11 (×6): 5 mg via ORAL
  Filled 2019-09-08 (×6): qty 1

## 2019-09-08 MED ORDER — SODIUM CHLORIDE 0.9 % IV SOLN
250.0000 mL | INTRAVENOUS | Status: DC | PRN
  Administered 2019-09-09: 250 mL via INTRAVENOUS

## 2019-09-08 MED ORDER — ONDANSETRON HCL 4 MG/2ML IJ SOLN: 4.0000 mg | Freq: Four times a day (QID) | INTRAMUSCULAR | Status: DC | PRN

## 2019-09-08 MED ORDER — FUROSEMIDE 10 MG/ML IJ SOLN
40.0000 mg | Freq: Once | INTRAMUSCULAR | Status: AC
  Administered 2019-09-08: 40 mg via INTRAVENOUS
  Filled 2019-09-08: qty 4

## 2019-09-08 MED ORDER — SODIUM CHLORIDE 0.9% FLUSH: 3.0000 mL | INTRAVENOUS | Status: DC | PRN

## 2019-09-08 MED ORDER — AMIODARONE HCL 200 MG PO TABS
200.0000 mg | ORAL_TABLET | Freq: Every day | ORAL | Status: DC
  Administered 2019-09-09 – 2019-09-11 (×3): 200 mg via ORAL
  Filled 2019-09-08 (×3): qty 1

## 2019-09-08 NOTE — H&P (Addendum)
Date: 09/08/2019               Patient Name:  Max Rivas MRN: [id number]  DOB: 1950-12-29 Age / Sex: 69 y.o., male   PCP: Max Fret, MD         Medical Service: Internal Medicine Teaching Service         Attending Physician: Dr. Oswaldo Rivas, Max Rivas, *    First Contact: Dr. Laural Rivas Pager: [phone]  Second Contact: Dr. Ephriam Rivas Pager: [phone]       After Hours (After 5p/  First Contact Pager: [phone]  weekends / holidays): Second Contact Pager: [phone]   Chief Complaint: Shortness of breath  History of Present Illness: [name]. [name] is a 69 year old man with past medical history significant for CHF (EF 30-35%, 05/23/19), PAF (on eliquis) and VT (s/p ICD placement), CAD (multiple prior MI's, multiple stents, recent CABGx3 on 05/23/19,) COPD (on 2L), PE, and OSA who presented to [facility] on 09/08/19 for evaluation of shortness of breath.  He states that since June, he has had progressively worsening shortness of breath with exertion. He states that with even minimally exertion, he develops the sensation that his heart is racing and he has trouble breathing. Since June, he has experienced multiple ICD shocks during these episodes. He typically is on 2L oxygen via [place] at home and has had to recently increase to 4L due to his oxygen saturation dropping with minimal movement. Over the past few days, he has had progressively worsening swelling in his bilateral lower extremities and increased his home lasix regimen from 40mg  twice daily to 40mg  three times daily. He also reports a cough for the past several months productive of white sputum. He denies shortness of breath with inactivity, and states that his breathing is not positional. He is able to sleep lying on his back without difficulty. He states that earlier today he developed the onset of chest pain with radiation to his right arm and blurry vision. He states that due to the progression of his symptoms and new onset chest  pain, he desired to come to the ED for evaluation.  Of note, he follows closely with his cardiologist, Dr. . He has a scheduled VT ablation and DC cardioversion in September for his atrial fibrillation. On 09/01/19, he had his home amiodarone increased from 200mg  daily to 200mg  twice daily. He also has a prescription for digoxin, however he is unsure whether he takes this medication as his roommate handles his medication. He also has plans to replace his ICD battery at the [facility] in the coming weeks.  Meds:  Current Meds  Medication Sig  . acetaminophen (TYLENOL) 325 MG tablet Take 2 tablets (650 mg total) by mouth every 6 (six) hours as needed for mild pain (or Fever >/= 101).  09/03/19 amiodarone (PACERONE) 200 MG tablet Take 1 tablet (200 mg total) by mouth daily.  apixaban (ELIQUIS) 5 MG TABS tablet Take 5 mg by mouth 2 (two) times daily.  aspirin 81 MG chewable tablet Chew 1 tablet (81 mg total) by mouth daily.  [place] atorvastatin (LIPITOR) 80 MG tablet Take 1 tablet (80 mg total) by mouth daily.  . carboxymethylcellulose (REFRESH PLUS) 0.5 % SOLN Place 1 drop into both eyes at bedtime.  . Cholecalciferol (VITAMIN D3) 50 MCG (2000 UT) TABS Take 2,000 Units by mouth daily.  . furosemide (LASIX) 40 MG tablet Take 40-80 mg by mouth 2 (two) times daily. Take 2 tablets (80  mg) in the morning and Take 1 tablet (40 mg) at bedtime  . gabapentin (NEURONTIN) 100 MG capsule Take 2 capsules (200 mg total) by mouth 2 (two) times daily.  Marland Kitchen levalbuterol (XOPENEX HFA) 45 MCG/ACT inhaler Inhale 2 puffs into the lungs every 6 (six) hours as needed for wheezing or shortness of breath.  . levothyroxine (SYNTHROID) 75 MCG tablet Take 75 mcg by mouth daily before breakfast.  . metoprolol succinate (TOPROL-XL) 25 MG 24 hr tablet Take 12.5 mg by mouth daily.  . mometasone (ASMANEX) 220 MCG/INH inhaler Inhale 2 puffs into the lungs 2 (two) times daily.   . montelukast (SINGULAIR) 10 MG tablet Take 10 mg by mouth daily.    . Multiple Vitamin (MULTIVITAMIN WITH MINERALS) TABS tablet Take 1 tablet by mouth daily.  . nitroGLYCERIN (NITROSTAT) 0.4 MG SL tablet Place 0.4 mg under the tongue every 5 (five) minutes as needed for chest pain.   Marland Kitchen omega-3 acid ethyl esters (LOVAZA) 1 g capsule Take 1 capsule (1 g total) by mouth 2 (two) times daily.  Marland Kitchen omeprazole (PRILOSEC) 20 MG capsule Take 20 mg by mouth 2 (two) times daily before a meal.  . Propylene Glycol (SYSTANE BALANCE) 0.6 % SOLN Place 1 drop into both eyes See admin instructions. Place 1 drop into each eye four times a day and apply a warm compress for 10 minutes afterwards  . Tiotropium Bromide-Olodaterol (STIOLTO RESPIMAT) 2.5-2.5 MCG/ACT AERS Inhale 2 puffs into the lungs daily.      Allergies: Allergies as of 09/08/2019 - Review Complete 09/08/2019  Allergen Reaction Noted  . Crestor [rosuvastatin calcium] Other (See Comments) 01/15/2016   Past Medical History:  Diagnosis Date  . Arthritis    "elbows, knees" (02/24/2016)  . Bronchitis 2006  . CHF (congestive heart failure) (HCC)   . COPD (chronic obstructive pulmonary disease) (HCC)   . Coronary artery disease   . GERD (gastroesophageal reflux disease)   . High cholesterol   . Myocardial infarction [facility]) ~ 1995 X 2;1998; 2000; 2004  . On home oxygen therapy    "2L w/activity" (02/24/2016)  . Pacemaker   . Pulmonary embolism (HCC) 02/24/2016  . Sleep apnea    "have CPAP; can't tolerate it" (02/24/2016)  . Unstable angina (HCC) 05/21/2019    Family History:  Mother, deceased - heart attack, T2DM Father, deceased - heart attack, T2DM Five brothers and sisters, deceased - heart attack, T2DM  Social History:  Recently quit smoking tobacco on May 9th, 2021. 105 pack year smoking history. Denies current alcohol or recreational drug use Lives in [place] with roommate  Review of Systems: A complete ROS was negative except as per HPI.   Physical Exam: Blood pressure 99/87, pulse (!) 109,  temperature 97.6 F (36.4 C), temperature source Oral, resp. rate (!) 24, SpO2 93 %.  General: chronically ill appearing male  HEENT: normal Cardiac: irregular rhythm. +3 pitting edema in the LE extending past the knee. Extremities warm Pulm: O2 sats in the low 80s on 3L>improved to high 80s on 4L. Can speak full sentences. Cough productive of clear sputum. Bibasilar crackles on ausculatation. GI: obese. nontender to palpation Neuro: a/o. No focal deficits appreciable Skin: no rash. Chronic venous stasis changes in LE. MSK: normal  EKG: personally reviewed my interpretation is atrial fibrillation.  CXR: bilateral pleural effusions (L>R). Cardiomegaly with pulmonary edema.  Assessment & Plan by Problem: Active Problems:   Acute exacerbation of congestive heart failure (HCC)  69 yo male with ICM, CAD  s/p CABG in May 2021, chronic combined HR NYHA class IV, chronic atrial fibrillation and VT s/p ICD admitted to IMTS on 09/08/19 for medical management of an acute heart failure exacerbation.  Acute on chronic hypoxic respiratory failure secondary to pulm edema and bilateral (L>R) pleural effusions. Acute CHF exacerbation. NYHA class IV. EF 30-35% with G2DD as reported on May echocardiogram. Suspect precipitated by afib/VT. Exam and diagnostic studies consistent with this.  Wt on discharge in May 107kg. On 40mg  lasix at home.  Given 40mg  IV lasix in ED. Chronic atrial fibrillation (CHADSVASc 5) and VT s/p ICD . Interrogated in ED--no significant abnormalities noted.  Follows with Dr. in EP Rate controlled on metoprolol. Rhythm controlled on amiodarone and mexiletine (recently started). Per Dr. note on 8/20, he recommended DCCV in September, prior to ablation. ICD approaching elective replacement indication. He was planning for this to be Rivas at 9/20. CAD s/p CABG x3 and LA clip in May 2021 by Dr. [place]. Plan --40mg  IV lasix given in ED. Will monitor UOP and give another dose  later tonight --consider cardiology consult in AM to discuss inpatient cardioversion vs continuing current plans --continue home medications for atrial fibrillation: metoprolol, amiodarone and mexiletine. AC with eliquis --monitor electrolytes --Tele monitoring, strict I/O, fluid restriction  Mild AKI (Creatinine 1.6 on admission). Baseline around 1.2. Ddx cardiorenal vs diuresis. Monitor daily Lactic Acidosis (2 on admission). Likely related to hypoxia/CHF exacerbation  COPD (not in acute exacerbation). Continue home medications. Chronic hypoxic respiratory failure on 2LNC.   Dispo: Admit patient to Inpatient with expected length of stay greater than 2 midnights.  Signed: June 2021, MD 09/08/2019, 8:07 PM  Pager: [phone] After 5pm on weekdays and 1pm on weekends: On Call pager: [phone]

## 2019-09-08 NOTE — ED Notes (Signed)
Hospital bed ordered.

## 2019-09-08 NOTE — ED Triage Notes (Signed)
Pt bib ems from home with reports of chest pain/shob onset today. Pt given 324mg  aspirin pta. Pt in afib on arrival. Hx of same.  132/78 HR 72-150 RR 20 92 % 4L [place]

## 2019-09-08 NOTE — ED Provider Notes (Signed)
[name] EMERGENCY DEPARTMENT Provider Note   CSN: [id number] Arrival date & time: 09/08/19  1716     History Chief Complaint  Patient presents with  . Chest Pain  . Shortness of Breath    Max Rivas is a 69 y.o. male.  Pt presents to the ED today with cp and sob.  He has a hx of PAF, VT and CAD.  He had a CABG in May of 2021.  He has a [employer] pacemaker.  He did see Dr. Ladona Ridgel on 09/01/19 who recommended increasing his amiodarone to 200 mg bid.  He planned on a cardioversion of his afib in September prior to his scheduled VT ablation in October.  On the 20th, his device was interrogated.  He was noted to have a few episodes of VT and was shocked back into afib.  Today, pt had cp and sob which started earlier today.  He has noticed a cough and increased swelling in his legs.  He called his doctor yesterday about the leg swelling and was told to double his lasix.  He did that this morning without any improvement in sx.  EMS did give pt ASA en route.  No nitro. His cp is gone.  He normally wears 2L oxygen, but increased it to 4 and that has helped his sob.  No f/c.  He's been Covid vaccinated.        Past Medical History:  Diagnosis Date  . Arthritis    "elbows, knees" (02/24/2016)  . Bronchitis 2006  . CHF (congestive heart failure) (HCC)   . COPD (chronic obstructive pulmonary disease) (HCC)   . Coronary artery disease   . GERD (gastroesophageal reflux disease)   . High cholesterol   . Myocardial infarction [facility]) ~ 1995 X 2;1998; 2000; 2004  . On home oxygen therapy    "2L w/activity" (02/24/2016)  . Pacemaker   . Pulmonary embolism (HCC) 02/24/2016  . Sleep apnea    "have CPAP; can't tolerate it" (02/24/2016)  . Unstable angina (HCC) 05/21/2019    Patient Active Problem List   Diagnosis Date Noted  . Unstable angina (HCC) 05/21/2019  . Ventricular tachycardia (HCC) 05/12/2019  . Chest pain   . VT (ventricular tachycardia) (HCC) 05/11/2019  .  Arrhythmia 03/12/2019  . On home O2   . Chronic combined systolic and diastolic CHF (congestive heart failure) (HCC) 06/22/2018  . Chronic respiratory failure with hypoxia (HCC) 06/22/2018  . Congestive heart failure (CHF) (HCC) 06/22/2018  . DCM (dilated cardiomyopathy) (HCC) 05/25/2017  . Coronary artery disease 05/25/2017  . Chronic atrial fibrillation 05/25/2017  . COPD (chronic obstructive pulmonary disease) (HCC) 05/25/2017  . CKD (chronic kidney disease) 05/25/2017  . Hypothyroidism 05/25/2017  . 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 OSA (obstructive sleep apnea) 05/25/2017  . Pulmonary HTN (HCC) 05/25/2017  . Acute on chronic respiratory failure with hypoxia (HCC)   . Mediastinal adenopathy   . Cervical adenopathy   . Acute respiratory failure with hypoxia (HCC) 02/24/2016  . Pulmonary embolus (HCC) 02/24/2016  . Pulmonary artery thrombosis (HCC)   . Pleural effusion   . Hypoxemia   . Lung mass   . Pleural plaque   . Flutter-fibrillation (HCC) 01/15/2016  . Acute systolic congestive heart failure (HCC)   . Atrial fibrillation with RVR (HCC)   . Hypercholesterolemia 03/11/2006  . Tobacco abuse 03/11/2006  . Acute on chronic systolic congestive heart failure (HCC) 03/11/2006  . GASTROESOPHAGEAL REFLUX, NO ESOPHAGITIS 03/11/2006  .  OSTEOARTHRITIS, MULTI SITES 03/11/2006  . HIGH RISK PATIENT 03/11/2006  . Tobacco dependence 03/11/2006    Past Surgical History:  Procedure Laterality Date  . CLIPPING OF ATRIAL APPENDAGE N/A 05/23/2019   Procedure: CLIPPING OF ATRIAL APPENDAGE with atriclip;  Surgeon: [name], MD;  Location: [facility] OR;  Service: Open Heart Surgery;  Laterality: N/A;  . CORONARY ANGIOPLASTY  1995  . CORONARY ANGIOPLASTY WITH STENT PLACEMENT  ~ 1995 - 2004 X 5   "I've got a total of 5 stents" (02/24/2016)  . CORONARY ARTERY BYPASS GRAFT N/A 05/23/2019   Procedure: CORONARY ARTERY BYPASS GRAFTING (CABG), times three, using  right greater saphenous vein and left internal mammary;  Surgeon: [name], MD;  Location: [facility] OR;  Service: Open Heart Surgery;  Laterality: N/A;  [place] only  . 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 INTRAVASCULAR PRESSURE WIRE/FFR STUDY N/A 05/12/2019   Procedure: INTRAVASCULAR PRESSURE WIRE/FFR STUDY;  Surgeon: Lyn Records, MD;  Location: [facility] INVASIVE CV LAB;  Service: Cardiovascular;  Laterality: N/A;  . LEFT HEART CATH AND CORONARY ANGIOGRAPHY N/A 05/12/2019   Procedure: LEFT HEART CATH AND CORONARY ANGIOGRAPHY;  Surgeon: Lyn Records, MD;  Location: [facility] INVASIVE CV LAB;  Service: Cardiovascular;  Laterality: N/A;  . PACEMAKER GENERATOR CHANGE  03/2011    [facility] [place]  . TEE WITHOUT CARDIOVERSION N/A 05/23/2019   Procedure: TRANSESOPHAGEAL ECHOCARDIOGRAM (TEE);  Surgeon: [name], MD;  Location[facility] OR;  Service: Open Heart Surgery;  Laterality: N/A;  . TONSILLECTOMY         Family History  Problem Relation Age of Onset  . Heart attack Mother   . Heart attack Father   . Heart attack Sister 26    Social History   Tobacco Use  . Smoking status: Current Some Day Smoker    Packs/day: 3.00    Years: 35.00    Pack years: 105.00    Types: Cigarettes  . Smokeless tobacco: Never Used  . Tobacco comment: 02/24/2016 "stopped ~ 8-9 months ago; have had 5 cigarettes in the last week"  Vaping Use  . Vaping Use: Never used  Substance Use Topics  . Alcohol use: Yes    Comment: 02/24/2016 "none in a long time"  . Drug use: Not Currently    Types: Cocaine    Comment: 02/24/2016 "none since 1995"    Home Medications Prior to Admission medications   Medication Sig Start Date End Date Taking? Authorizing Provider  acetaminophen (TYLENOL) 325 MG tablet Take 2 tablets (650 mg total) by mouth every 6 (six) hours as needed for mild pain (or Fever >/= 101). 03/05/16  Yes Alison Murray, MD  amiodarone (PACERONE) 200 MG tablet  Take 1 tablet (200 mg total) by mouth daily. 06/01/19  Yes Barrett, Erin R, PA-C  apixaban (ELIQUIS) 5 MG TABS tablet Take 5 mg by mouth 2 (two) times daily.   Yes [provider]  aspirin 81 MG chewable tablet Chew 1 tablet (81 mg total) by mouth daily. 05/16/19  Yes Kroeger, Dot Lanes M., PA-C  atorvastatin (LIPITOR) 80 MG tablet Take 1 tablet (80 mg total) by mouth daily. 05/15/19  Yes Kroeger, Dot Lanes M., PA-C  carboxymethylcellulose (REFRESH PLUS) 0.5 % SOLN Place 1 drop into both eyes at bedtime.   Yes [provider]  Cholecalciferol (VITAMIN D3) 50 MCG (2000 UT) TABS Take 2,000 Units by mouth daily.  Yes [provider]  furosemide (LASIX) 40 MG tablet Take 40-80 mg by mouth 2 (two) times daily. Take 2 tablets (80 mg) in the morning and Take 1 tablet (40 mg) at bedtime   Yes [provider]  gabapentin (NEURONTIN) 100 MG capsule Take 2 capsules (200 mg total) by mouth 2 (two) times daily. 06/01/19  Yes Barrett, Erin R, PA-C  levalbuterol (XOPENEX HFA) 45 MCG/ACT inhaler Inhale 2 puffs into the lungs every 6 (six) hours as needed for wheezing or shortness of breath.   Yes [provider]  levothyroxine (SYNTHROID) 75 MCG tablet Take 75 mcg by mouth daily before breakfast.   Yes [provider]  metoprolol succinate (TOPROL-XL) 25 MG 24 hr tablet Take 12.5 mg by mouth daily.   Yes [provider]  mometasone (ASMANEX) 220 MCG/INH inhaler Inhale 2 puffs into the lungs 2 (two) times daily.    Yes [provider]  montelukast (SINGULAIR) 10 MG tablet Take 10 mg by mouth daily.    Yes [provider]  Multiple Vitamin (MULTIVITAMIN WITH MINERALS) TABS tablet Take 1 tablet by mouth daily.   Yes [provider]  nitroGLYCERIN (NITROSTAT) 0.4 MG SL tablet Place 0.4 mg under the tongue every 5 (five) minutes as needed for chest pain.    Yes [provider]  omega-3 acid ethyl esters (LOVAZA) 1 g capsule Take 1  capsule (1 g total) by mouth 2 (two) times daily. 03/05/16  Yes Alison Murray, MD  omeprazole (PRILOSEC) 20 MG capsule Take 20 mg by mouth 2 (two) times daily before a meal.   Yes [provider]  Propylene Glycol (SYSTANE BALANCE) 0.6 % SOLN Place 1 drop into both eyes See admin instructions. Place 1 drop into each eye four times a day and apply a warm compress for 10 minutes afterwards   Yes [provider]  Tiotropium Bromide-Olodaterol (STIOLTO RESPIMAT) 2.5-2.5 MCG/ACT AERS Inhale 2 puffs into the lungs daily.    Yes [provider]  dextromethorphan-guaiFENesin (MUCINEX DM) 30-600 MG 12hr tablet Take 1 tablet by mouth 2 (two) times daily as needed for cough. Patient not taking: Reported on 09/08/2019 06/01/19   Barrett, Rae Roam, PA-C  digoxin (LANOXIN) 0.125 MG tablet Take 0.5 tablets (0.0625 mg total) by mouth daily. Patient not taking: Reported on 09/08/2019 06/15/19   Rosalio Macadamia, NP  midodrine (PROAMATINE) 5 MG tablet Take 1 tablet (5 mg total) by mouth 3 (three) times daily with meals. Patient not taking: Reported on 09/08/2019 06/01/19   Barrett, Rae Roam, PA-C  traMADol (ULTRAM) 50 MG tablet Take 1 tablet (50 mg total) by mouth every 4 (four) hours as needed for moderate pain. Patient not taking: Reported on 09/08/2019 06/01/19   Barrett, Rae Roam, PA-C    Allergies    Crestor [rosuvastatin calcium]  Review of Systems   Review of Systems  Respiratory: Positive for cough and shortness of breath.   Cardiovascular: Positive for leg swelling.  All other systems reviewed and are negative.   Physical Exam Updated Vital Signs BP 99/87   Pulse (!) 109   Temp 98.1 F (36.7 C) (Oral)   Resp (!) 24   SpO2 93%   Physical Exam Vitals and nursing note reviewed.  Constitutional:      Appearance: He is ill-appearing.  HENT:     Head: Normocephalic and atraumatic.  Eyes:     Extraocular Movements: Extraocular movements intact.     Pupils: Pupils are equal,  round, and reactive to light.  Cardiovascular:     Rate and Rhythm: Tachycardia present. Rhythm irregular.     Heart sounds: Normal heart sounds.  Pulmonary:     Effort: Pulmonary effort is normal.     Breath sounds: Wheezing and rhonchi present.  Abdominal:     General: Bowel sounds are normal.     Palpations: Abdomen is soft.  Musculoskeletal:     Cervical back: Neck supple.     Right lower leg: Edema present.     Left lower leg: Edema present.  Skin:    General: Skin is warm.     Capillary Refill: Capillary refill takes less than 2 seconds.  Neurological:     General: No focal deficit present.     Mental Status: He is alert and oriented to person, place, and time.  Psychiatric:        Mood and Affect: Mood normal.        Behavior: Behavior normal.     ED Results / Procedures / Treatments   Labs (all labs ordered are listed, but only abnormal results are displayed) Labs Reviewed  BRAIN NATRIURETIC PEPTIDE - Abnormal; Notable for the following components:      Result Value   B Natriuretic Peptide 455.0 (*)    All other components within normal limits  COMPREHENSIVE METABOLIC PANEL - Abnormal; Notable for the following components:   Glucose, Bld 125 (*)    Creatinine, Ser 1.61 (*)    GFR calc non Af Amer 44 (*)    GFR calc Af Amer 51 (*)    All other components within normal limits  LACTIC ACID, PLASMA - Abnormal; Notable for the following components:   Lactic Acid, Venous 2.0 (*)    All other components within normal limits  DIGOXIN LEVEL - Abnormal; Notable for the following components:   Digoxin Level <0.2 (*)    All other components within normal limits  CULTURE, BLOOD (ROUTINE X 2)  CULTURE, BLOOD (ROUTINE X 2)  SARS CORONAVIRUS 2 BY RT PCR (HOSPITAL ORDER, PERFORMED IN [facility])  CBC WITH DIFFERENTIAL/PLATELET  TSH  LACTIC ACID, PLASMA  TROPONIN I (HIGH SENSITIVITY)  TROPONIN I (HIGH SENSITIVITY)    EKG EKG  Interpretation  Date/Time:  Friday September 08 2019 17:27:47 EDT Ventricular Rate:  117 PR Interval:    QRS Duration: 117 QT Interval:  401 QTC Calculation: 560 R Axis:   -16 Text Interpretation: Atrial fibrillation Nonspecific intraventricular conduction delay Low voltage, extremity leads Probable anterolateral infarct, old Minimal ST depression, inferior leads No significant change since last tracing Confirmed by Jacalyn Lefevre [phone]) on 09/08/2019 5:59:13 PM   Radiology DG Chest Port 1 View  Result Date: 09/08/2019 CLINICAL DATA:  Shortness of breath EXAM: PORTABLE CHEST 1 VIEW COMPARISON:  08/28/2019 FINDINGS: Left AICD remains in place, unchanged. Cardiomegaly, vascular congestion. There is hyperinflation of the lungs compatible with COPD. Probable underlying fibrosis within the lungs. Small right effusion and moderate left effusion. Mid and lower lung increased interstitial markings and airspace disease likely reflect superimposed edema. IMPRESSION: Cardiomegaly. COPD/chronic lung disease. Bilateral pleural effusions, enlarged since prior study, moderate on the left. Probable mild superimposed pulmonary edema. Electronically Signed   By: Charlett Nose M.D.   On: 09/08/2019 18:05    Procedures Procedures (including critical care time)  Medications Ordered in ED Medications  furosemide (LASIX) injection 40 mg (40 mg Intravenous Given 09/08/19 1830)    ED Course  I have reviewed the triage  vital signs and the nursing notes.  Pertinent labs & imaging results that were available during my care of the patient were reviewed by me and considered in my medical decision making (see chart for details).    MDM Rules/Calculators/A&P                          Pacemaker interrogated.  No ventricular events.  Pt's fluid level is elevated.  Pt does have CHF and pleural effusions.  Pt given 40 mg of lasix IV and he is responding to that.    Pt becomes very sob with any movement even on 4L.  I  think an admission for IV lasix will be beneficial.   Pt d/w IMTS for admission.  CHA2DS2/VAS Stroke Risk Points  Current as of 9 minutes ago     3 >= 2 Points: High Risk  1 - 1.99 Points: Medium Risk  0 Points: Low Risk    Last Change: N/A      Details    This score determines the patient's risk of having a stroke if the  patient has atrial fibrillation.       Points Metrics  1 Has Congestive Heart Failure:  Yes    Current as of 9 minutes ago  1 Has Vascular Disease:  Yes    Current as of 9 minutes ago  0 Has Hypertension:  No    Current as of 9 minutes ago  1 Age:  89    Current as of 9 minutes ago  0 Has Diabetes:  No    Current as of 9 minutes ago  0 Had Stroke:  No  Had TIA:  No  Had thromboembolism:  No    Current as of 9 minutes ago  0 Male:  No    Current as of 9 minutes ago           Final Clinical Impression(s) / ED Diagnoses Final diagnoses:  Acute on chronic congestive heart failure, unspecified heart failure type (HCC)  Pleural effusion, bilateral  Paroxysmal atrial fibrillation (HCC)    Rx / DC Orders ED Discharge Orders    None       Jacalyn Lefevre, MD 09/08/19 1943

## 2019-09-09 ENCOUNTER — Inpatient Hospital Stay (HOSPITAL_COMMUNITY): Payer: No Typology Code available for payment source

## 2019-09-09 ENCOUNTER — Encounter (HOSPITAL_COMMUNITY): Payer: Self-pay | Admitting: Student in an Organized Health Care Education/Training Program

## 2019-09-09 DIAGNOSIS — I255 Ischemic cardiomyopathy: Secondary | ICD-10-CM

## 2019-09-09 DIAGNOSIS — J9621 Acute and chronic respiratory failure with hypoxia: Secondary | ICD-10-CM

## 2019-09-09 DIAGNOSIS — I5023 Acute on chronic systolic (congestive) heart failure: Secondary | ICD-10-CM

## 2019-09-09 DIAGNOSIS — I340 Nonrheumatic mitral (valve) insufficiency: Secondary | ICD-10-CM

## 2019-09-09 DIAGNOSIS — J449 Chronic obstructive pulmonary disease, unspecified: Secondary | ICD-10-CM

## 2019-09-09 DIAGNOSIS — I361 Nonrheumatic tricuspid (valve) insufficiency: Secondary | ICD-10-CM

## 2019-09-09 DIAGNOSIS — Z7901 Long term (current) use of anticoagulants: Secondary | ICD-10-CM

## 2019-09-09 DIAGNOSIS — E785 Hyperlipidemia, unspecified: Secondary | ICD-10-CM

## 2019-09-09 DIAGNOSIS — I4819 Other persistent atrial fibrillation: Secondary | ICD-10-CM

## 2019-09-09 DIAGNOSIS — I472 Ventricular tachycardia: Secondary | ICD-10-CM

## 2019-09-09 DIAGNOSIS — Z955 Presence of coronary angioplasty implant and graft: Secondary | ICD-10-CM

## 2019-09-09 DIAGNOSIS — Z95810 Presence of automatic (implantable) cardiac defibrillator: Secondary | ICD-10-CM

## 2019-09-09 DIAGNOSIS — E039 Hypothyroidism, unspecified: Secondary | ICD-10-CM

## 2019-09-09 DIAGNOSIS — Z9981 Dependence on supplemental oxygen: Secondary | ICD-10-CM

## 2019-09-09 LAB — BASIC METABOLIC PANEL
Anion gap: 12 (ref 5–15)
BUN: 15 mg/dL (ref 8–23)
CO2: 27 mmol/L (ref 22–32)
Calcium: 8.6 mg/dL — ABNORMAL LOW (ref 8.9–10.3)
Chloride: 99 mmol/L (ref 98–111)
Creatinine, Ser: 1.4 mg/dL — ABNORMAL HIGH (ref 0.61–1.24)
GFR calc Af Amer: 60 mL/min — ABNORMAL LOW (ref >60)
GFR calc non Af Amer: 52 mL/min — ABNORMAL LOW (ref >60)
Glucose, Bld: 94 mg/dL (ref 70–99)
Potassium: 3.1 mmol/L — ABNORMAL LOW (ref 3.5–5.1)
Sodium: 138 mmol/L (ref 135–145)

## 2019-09-09 LAB — IRON AND TIBC
Iron: 42 ug/dL — ABNORMAL LOW (ref 45–182)
Saturation Ratios: 13 % — ABNORMAL LOW (ref 17.9–39.5)
TIBC: 318 ug/dL (ref 250–450)
UIBC: 276 ug/dL

## 2019-09-09 LAB — ECHOCARDIOGRAM COMPLETE
S' Lateral: 6.3 cm
Weight: 3536 oz

## 2019-09-09 LAB — MAGNESIUM: Magnesium: 2.2 mg/dL (ref 1.7–2.4)

## 2019-09-09 LAB — FERRITIN: Ferritin: 47 ng/mL (ref 24–336)

## 2019-09-09 MED ORDER — FUROSEMIDE 10 MG/ML IJ SOLN
40.0000 mg | Freq: Once | INTRAMUSCULAR | Status: AC
  Administered 2019-09-09: 40 mg via INTRAVENOUS
  Filled 2019-09-09: qty 4

## 2019-09-09 MED ORDER — PERFLUTREN LIPID MICROSPHERE
1.0000 mL | INTRAVENOUS | Status: AC | PRN
  Administered 2019-09-09: 4 mL via INTRAVENOUS
  Filled 2019-09-09: qty 10

## 2019-09-09 MED ORDER — ALUM & MAG HYDROXIDE-SIMETH 200-200-20 MG/5ML PO SUSP
30.0000 mL | ORAL | Status: DC | PRN
  Administered 2019-09-09: 30 mL via ORAL
  Filled 2019-09-09: qty 30

## 2019-09-09 MED ORDER — POTASSIUM CHLORIDE CRYS ER 20 MEQ PO TBCR: 40.0000 meq | EXTENDED_RELEASE_TABLET | Freq: Four times a day (QID) | ORAL | Status: DC

## 2019-09-09 MED ORDER — ALUM & MAG HYDROXIDE-SIMETH 200-200-20 MG/5ML PO SUSP: 30.0000 mL | ORAL | Status: DC | PRN

## 2019-09-09 MED ORDER — FUROSEMIDE 10 MG/ML IJ SOLN
40.0000 mg | Freq: Two times a day (BID) | INTRAMUSCULAR | Status: DC
  Administered 2019-09-09 (×2): 40 mg via INTRAVENOUS
  Filled 2019-09-09 (×3): qty 4

## 2019-09-09 MED ORDER — POTASSIUM CHLORIDE CRYS ER 20 MEQ PO TBCR
40.0000 meq | EXTENDED_RELEASE_TABLET | Freq: Four times a day (QID) | ORAL | Status: AC
  Administered 2019-09-09 (×3): 40 meq via ORAL
  Filled 2019-09-09 (×3): qty 2

## 2019-09-09 MED ORDER — SODIUM CHLORIDE 0.9 % IV SOLN
510.0000 mg | INTRAVENOUS | Status: DC
  Administered 2019-09-09: 510 mg via INTRAVENOUS
  Filled 2019-09-09 (×2): qty 17

## 2019-09-09 NOTE — H&P (View-Only) (Signed)
Cardiology Consult    Patient ID: Max Rivas MRN: [id number], DOB/AGE: [birth year]   Admit date: 09/08/2019 Date of Consult: 09/09/2019  Primary Physician: Anson Fret, MD Primary Cardiologist: Lesleigh Noe, MD Requesting Provider: Tyson Alias, MD  Patient Profile    Max Rivas is a 69 y.o. male with a history of CAD status post three-vessel bypass in May 2021, heart failure with midrange ejection fraction (40-45% May 2021), ischemic cardiomyopathy, ventricular tachycardia status post single lead AICD (last upgrade 2013), persistent atrial fibrillation on Eliquis and amiodarone, remote tobacco abuse, COPD on home O2, morbid obesity, and obstructive sleep apnea, who is being seen today for the evaluation of acute on chronic combined heart failure and rapid atrial fibrillation at the request of Dr. Oswaldo Done.  Past Medical History   Past Medical History:  Diagnosis Date  . Arthritis    "elbows, knees" (02/24/2016)  . Bronchitis 2006  . CAD (coronary artery disease)    a. s/p prior MIs - 1995 x 2, 1998; b. s/p prior LCX stenting; c. 04/2019 Cath: LM min irregs, LAD 65p/mi, D1 75, LCX 99ost/p, 68m (ISR), OM2 80, RCA/RPDA mod diff dzs; d. 05/2019 CABG x 3: LIMA->LAD, VG->Diag, VG->OM.  Marland Kitchen COPD (chronic obstructive pulmonary disease) (HCC)    a. Remote tobacco-->on home O2.  Marland Kitchen GERD (gastroesophageal reflux disease)   . 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 05/2019 Echo: EF 40-45%, gr2 DD. Nl RV size/fxn. Mildly dil LA. Mild MR.  . High cholesterol   . Ischemic cardiomyopathy    a. 05/2019 Echo: EF 40-45%.  . Morbid obesity (HCC)   . Myocardial infarction [facility]) ~ 1995 X 2;1998; 2000; 2004  . On home oxygen therapy    "2L w/activity" (02/24/2016)  . PAF (paroxysmal atrial fibrillation) (HCC)    a. CHA2DS2VASc = 4-->eliquis. Also on amio.  . Pulmonary embolism (HCC) 02/24/2016  . Sleep apnea    "have CPAP; can't tolerate it"  (02/24/2016)  . Ventricular tachycardia (HCC)    a. 2005 s/p ICD; b. 03/2011 Device upgrade/lead exchange: MDT Protecta XT VR single lead AICD.    Past Surgical History:  Procedure Laterality Date  . CLIPPING OF ATRIAL APPENDAGE N/A 05/23/2019   Procedure: CLIPPING OF ATRIAL APPENDAGE with atriclip;  Surgeon: [name], MD;  Location: [facility] OR;  Service: Open Heart Surgery;  Laterality: N/A;  . CORONARY ANGIOPLASTY  1995  . CORONARY ANGIOPLASTY WITH STENT PLACEMENT  ~ 1995 - 2004 X 5   "I've got a total of 5 stents" (02/24/2016)  . CORONARY ARTERY BYPASS GRAFT N/A 05/23/2019   Procedure: CORONARY ARTERY BYPASS GRAFTING (CABG), times three, using right greater saphenous vein and left internal mammary;  Surgeon: [name], MD;  Location: [facility] OR;  Service: Open Heart Surgery;  Laterality: N/A;  [place] only  . 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 INTRAVASCULAR PRESSURE WIRE/FFR STUDY N/A 05/12/2019   Procedure: INTRAVASCULAR PRESSURE WIRE/FFR STUDY;  Surgeon: Lyn Records, MD;  Location: [facility] INVASIVE CV LAB;  Service: Cardiovascular;  Laterality: N/A;  . LEFT HEART CATH AND CORONARY ANGIOGRAPHY N/A 05/12/2019   Procedure: LEFT HEART CATH AND CORONARY ANGIOGRAPHY;  Surgeon: Lyn Records, MD;  Location: [facility] INVASIVE CV LAB;  Service: Cardiovascular;  Laterality: N/A;  . PACEMAKER GENERATOR CHANGE  03/2011    [facility] [place]  . TEE WITHOUT  CARDIOVERSION N/A 05/23/2019   Procedure: TRANSESOPHAGEAL ECHOCARDIOGRAM (TEE);  Surgeon: [name], MD;  Location: [facility] OR;  Service: Open Heart Surgery;  Laterality: N/A;  . TONSILLECTOMY       Allergies  Allergies  Allergen Reactions  . Crestor [Rosuvastatin Calcium] Other (See Comments)    Back spasms, but can tolerate it at a low dose now    History of Present Illness    69 year old male with the above complex past medical history including CAD, heart failure with midrange ejection  fraction, ischemic cardiomyopathy, ventricular tachycardia status post single lead AICD (last upgrade 2013), persistent atrial fibrillation on Eliquis and amiodarone, remote tobacco abuse, COPD on home O2, morbid obesity, and obstructive sleep apnea.  Cardiac history dates back to the mid 90s, when he suffered 2 myocardial infarctions in approximately 1995 with a third in 1998.  He previously underwent circumflex stenting.  Much of his care has been provided at the [facility].  He initially underwent single lead AICD placement in 2005 in the setting of cardiomyopathy and ventricular tachycardia.  His device was upgraded in 2013 with lead change in the setting of a [employer] lead fracture.  In April of this year, he was evaluated with diagnostic catheterization in the setting of unstable angina revealing severe multivessel disease including in-stent restenosis in the left circumflex.  He was evaluated by CT surgery and subsequently underwent CABG x3 with a LIMA to the LAD, and separate vein grafts to the obtuse marginal and diagonal.  Preoperative echo showed an EF of 40-45% with grade 2 diastolic dysfunction.  Patient says he did reasonably well postoperatively but in mid July, he had a hypoxic episode in the setting of running out of oxygen which was followed by an ICD shock.  Since then, he has noticed persistent dyspnea on exertion, malaise, and some dyspnea at rest, especially when speaking or eating.  In this setting, he has had early satiety.  He was seen in electrophysiology clinic on August 20 and device interrogation showed persistent atrial fibrillation.  Previous ICD shocks were noted for ventricular tachycardia.  His amiodarone was increased to 200 mg twice daily with a plan for cardioversion in early September to be followed by VT ablation (he is also on mexiletine).    Unfortunately, since his office visit August 20, he has continued to feel poorly.  He notes that with ambulation,  even with oxygen running, it is common for his saturations to drop into the mid 60s.  By increasing oxygen to 6 L/min and resting, he can get saturations up into the 90s within a few minutes.  He has also been noting mild increase in lower ext edema.  Yesterday, August 27, he ambulated in his home and had recurrence of severe dyspnea however this time, it was associated with profound lightheadedness/presyncope.  He felt as though he might lose consciousness.  Again, he noted a saturation in the 60s upon sitting and resting.  He continued to feel somewhat lightheaded and called EMS.  He was taken to the emergency department where he is found to be in atrial fibrillation with rates in the 70s to 150s.  His twelve-lead showed A. fib at 117 with prior anteroseptal infarct and mild inferolateral ST depression which was slightly more pronounced at higher rates.  Creatinine was elevated above prior baseline at 1.61 (1.27 in August 16).  BNP elevated at 455.  High-sensitivity troponin normal at 12  13.  Chest x-ray showed cardiomegaly, COPD/chronic lung disease,  bilateral pleural effusions enlarged since prior study.  He has received 2 doses of lasix 40mg  IV since yesterday w/ good response, though he is still quite dyspneic w/ speech and orthopneic.  No chest pain or palpitations.  Inpatient Medications    . amiodarone  200 mg Oral Daily  . apixaban  5 mg Oral BID  . aspirin  81 mg Oral Daily  . atorvastatin  80 mg Oral Daily  . budesonide  0.5 mg Inhalation BID  . furosemide  40 mg Intravenous BID  . gabapentin  200 mg Oral BID  . levothyroxine  75 mcg Oral QAC breakfast  . montelukast  10 mg Oral Daily  . potassium chloride  40 mEq Oral Q6H  . sodium chloride flush  3 mL Intravenous Q12H    Family History    Family History  Problem Relation Age of Onset  . Heart attack Mother   . Heart attack Father   . Heart attack Sister 86   He indicated that his mother is deceased. He indicated that his  father is deceased. He indicated that the status of his sister is unknown.   Social History    Social History   Socioeconomic History  . Marital status: Divorced    Spouse name: Not on file  . Number of children: Not on file  . Years of education: Not on file  . Highest education level: Not on file  Occupational History  . Not on file  Tobacco Use  . Smoking status: Current Some Day Smoker    Packs/day: 3.00    Years: 35.00    Pack years: 105.00    Types: Cigarettes  . Smokeless tobacco: Former 59    Quit date: 02/13/2017  Vaping Use  . Vaping Use: Never used  Substance and Sexual Activity  . Alcohol use: Not Currently  . Drug use: Not Currently    Types: Cocaine    Comment: none since 1995  . Sexual activity: Yes  Other Topics Concern  . Not on file  Social History Narrative   Lives locally with family.  Does not routinely exercise.   Social Determinants of Health   Financial Resource Strain:   . Difficulty of Paying Living Expenses: Not on file  Food Insecurity:   . Worried About 1996 in the Last Year: Not on file  . Ran Out of Food in the Last Year: Not on file  Transportation Needs:   . Lack of Transportation (Medical): Not on file  . Lack of Transportation (Non-Medical): Not on file  Physical Activity:   . Days of Exercise per Week: Not on file  . Minutes of Exercise per Session: Not on file  Stress:   . Feeling of Stress : Not on file  Social Connections:   . Frequency of Communication with Friends and Family: Not on file  . Frequency of Social Gatherings with Friends and Family: Not on file  . Attends Religious Services: Not on file  . Active Member of Clubs or Organizations: Not on file  . Attends Programme researcher, broadcasting/film/video Meetings: Not on file  . Marital Status: Not on file  Intimate Partner Violence:   . Fear of Current or Ex-Partner: Not on file  . Emotionally Abused: Not on file  . Physically Abused: Not on file  . Sexually Abused:  Not on file     Review of Systems    General:  No chills, fever, night sweats or weight changes.  Cardiovascular:  No chest pain, +++ dyspnea on exertion, +++ lower ext edema, +++ orthopnea, occas palpitations, +++ paroxysmal nocturnal dyspnea, +++ presyncope. Dermatological: No rash, lesions/masses Respiratory: No cough, dyspnea Urologic: No hematuria, dysuria Abdominal:   No nausea, vomiting, diarrhea, bright red blood per rectum, melena, or hematemesis Neurologic:  No visual changes, wkns, changes in mental status. All other systems reviewed and are otherwise negative except as noted above.  Physical Exam    Blood pressure 107/74, pulse 76, temperature 97.6 F (36.4 C), temperature source Oral, resp. rate 14, weight 100.2 kg, SpO2 96 %.  General: Pleasant, dyspneic w/ speech. Psych: flat affect. Neuro: Alert and oriented X 3. Moves all extremities spontaneously. HEENT: Normal  Neck: Supple without bruits.  JVP mod elevated.   Lungs:  Resp regular and unlabored, coarse breath sounds and exp wheezing throughout A/P. Heart: IR, IR, distant, no s3, s4, or murmurs. Abdomen: Obese, Soft, non-tender, non-distended, BS + x 4.  Extremities: No clubbing, cyanosis.  2+ bilat ankle edema. DP/PT/Radials 1+ and equal bilaterally.  Labs    Cardiac Enzymes Recent Labs  Lab 08/28/19 0058 09/08/19 1822 09/08/19 2133  TROP[facility] Lab Results  Component Value Date   WBC 7.8 09/08/2019   HGB 13.8 09/08/2019   HCT 44.1 09/08/2019   MCV 92.8 09/08/2019   PLT 215 09/08/2019    Recent Labs  Lab 09/08/19 1822 09/08/19 1822 09/09/19 0618  NA 138   < > 138  K 3.7   < > 3.1*  CL 99   < > 99  CO2 28   < > 27  BUN 17   < > 15  CREATININE 1.61*   < > 1.40*  CALCIUM 9.1   < > 8.6*  PROT 6.8  --   --   BILITOT 1.1  --   --   ALKPHOS 76  --   --   ALT 27  --   --   AST 25  --   --   GLUCOSE 125*   < > 94   < > = values in this interval not displayed.   Lab Results    Component Value Date   CHOL 126 05/13/2019   HDL 36 (L) 05/13/2019   LDLCALC 70 05/13/2019   TRIG 101 05/13/2019   Lab Results  Component Value Date   DDIMER 3.99 (H) 02/24/2016     Radiology Studies    DG Chest 2 View  Result Date: 08/28/2019 CLINICAL DATA:  Chest pain. EXAM: CHEST - 2 VIEW COMPARISON:  Most recent radiograph 06/28/2019.  Chest CT 07/25/2018 FINDINGS: Median sternotomy. Clipping of left atrial appendage. Left-sided pacemaker in place with unchanged leads. Stable cardiomediastinal contours with upper normal heart size. Chronic right pleural thickening and pleural calcifications. Scarring at the right lung base. Small left pleural effusion has improved from prior exam. No pulmonary edema or acute airspace disease. No pneumothorax. No acute osseous abnormalities are seen. IMPRESSION: 1. No acute findings. 2. Small left pleural effusion has improved from prior exam. 3. Chronic right pleural thickening, pleural calcifications, and basilar scarring. Electronically Signed   By: Narda Rutherford M.D.   On: 08/28/2019 01:33   DG Chest Port 1 View  Result Date: 09/08/2019 CLINICAL DATA:  Shortness of breath EXAM: PORTABLE CHEST 1 VIEW COMPARISON:  08/28/2019 FINDINGS: Left AICD remains in place, unchanged. Cardiomegaly, vascular congestion. There is hyperinflation of the lungs compatible with COPD. Probable underlying fibrosis within the  lungs. Small right effusion and moderate left effusion. Mid and lower lung increased interstitial markings and airspace disease likely reflect superimposed edema. IMPRESSION: Cardiomegaly. COPD/chronic lung disease. Bilateral pleural effusions, enlarged since prior study, moderate on the left. Probable mild superimposed pulmonary edema. Electronically Signed   By: Charlett Nose M.D.   On: 09/08/2019 18:05    ECG & Cardiac Imaging    Afib, 117, anteroseptal infarct, inflat ST depression - slightly more pronounced than on prior episodes - personally  reviewed.  Assessment & Plan    1.  Acute on chronic heart failure with midrange ejection fraction/ischemic cardiomyopathy: Patient with an EF of 40-45% by echo in May 2021, who presented to the emergency department yesterday after an episode of presyncope and hypoxia.  He has been experiencing worsening fatigue, dyspnea, malaise and more recent lower extremity swelling since mid July following an episode of VT requiring ICD shock.  He has also been in atrial fibrillation since that time.  Recently seen in the outpatient setting with plan for cardioversion in early September, as it was felt to likely be driving symptoms.  He remains in atrial fibrillation at this time with rates in the 1 teens and is being admitted by the medicine service.  He is very dyspneic w/ speech and his lungs are quite tight w/ significant exp wheezing.  Cont IV diuresis.  2.  Persistent atrial fibrillation with rapid ventricular response: Noted on recent clinic visit but dating back to July.  Amiodarone dose was increased to 200 mg twice daily at clinic visit on August 20, with plan for outpatient cardioversion in early September.  Rates in the ED are in the 90s to 1 teens currently in the setting of ongoing dyspnea and wheezing.  We had considered cardioverting in the emergency department however, with severe COPD and hypoxic respiratory failure at baseline, this will be deferred until anesthesia can be on hand on Monday.  Plan diuresis over this weekend.  Continue amiodarone and Eliquis.  Of note, he has not missed any Eliquis doses.  3.  Presyncope/ventricular tachycardia: Patient experienced profound presyncope in the setting of hypoxia which prompted his call to EMS.  He says his saturation dropped into the 60s.  He has experienced similar hypoxia on a somewhat regular basis when ambulating at home but does not usually experience presyncope.  He did feel like his heart was racing though, he did not require ICD shock.  His  device was interrogated in the emergency department though at this time, I cannot locate that paperwork.  Continue amiodarone and mexiletine.  Plans for VT ablation at some point in September per Dr. Ladona Ridgel.  His device is approaching elective replacement indicators and patient plans to have that changed out at [facility].  4.  Coronary artery disease: Status post three-vessel bypass in May.  Despite rapid rates, heart failure, and hypoxia, troponins are normal.  Continue aspirin and statin therapy.  5.  Acute on chronic hypoxic respiratory failure/COPD: Complicated by volume excess.  His lungs are very tight with expiratory wheezing throughout anteriorly and posteriorly.  Inhaler/nebulizer therapy per internal medicine.  I suspect he might benefit from steroids.  6.  Hyperlipidemia: LDL of 70 in May.  Continue statin therapy.  7.  Acute kidney injury: Creatinine 1.61 on arrival yesterday evening, which was up from 1.27 on August 16.  With diuresis, this is improved and he is 1.40 this morning.  Follow with ongoing diuresis.  Signed, Nicolasa Ducking, NP 09/09/2019, 11:32 AM  For questions or updates, please contact   Please consult [email or web address] for contact info under Cardiology/STEMI.    I have seen and examined the patient along with Nicolasa Ducking, NP.  I have reviewed the chart, notes and new data.  I agree with PA/NP's note.  Key new complaints: breathing much better after bronchodilators Key examination changes: very distant breath sounds, but no longer wheezing Key new findings / data: performed in person ICD check. MDT single chamber device with normal function. Very close to RRT at 2.63 V. Normal lead parameters. Heart rate histogram shifted left c/w AF w RVR/. Optivol invalid since CABG.   No VT/VF since last device interrogation. Most recent syncope was probably due to hypoxia.  PLAN: AFib is well rate controlled and he is anticoagulated. He would  benefit from restoration of normal rhythm due to CHF. Has eaten and has severe chronic lung disease. Will plan DCCV with Anesthesiology support on Monday.  Thurmon Fair, MD, [facility] [phone] 09/09/2019, 2:30 PM

## 2019-09-09 NOTE — Progress Notes (Addendum)
Subjective:  Max Rivas is a 69 y.o. M with PMH of CAD s/p CABG, ICM, chronic combined systolic/diastolic heart failure (EF 30-35%), persistent atrial fibrillation, intermittent episodes of VT s/p ICD, COPD (2L at home), HLD, HTN admit for dyspnea on hospital day 1  Max Rivas reports that any movement causes symptoms to happen. He reports he has had 4 shocks in the last month. Last shock was about a week ago. Reports SOB, chest pain and blurry eyesight started yesterday. The pain was not as severe as heart attack but was very sharp. Reports unable to take nitro because they got wet and had fused together. He mentions that his pacemaker was placed in 2013.   Since admission, he mentions good urinary output (filling 3 urinals overnight) and mentions feeling improvement in his swelling and breathing. However during examine, he was noted to have increased oxygen requirement to 6 L on nasal canula.  Objective:  Vital signs in last 24 hours: Vitals:   09/09/19 0400 09/09/19 0415 09/09/19 0430 09/09/19 0445  BP: 96/74 101/67 104/67 (!) 102/59  Pulse: 84 80 84 80  Resp: 13 14 15 14   Temp:      TempSrc:      SpO2: 94% 93% 90% 90%  Weight:       Gen: Well-developed, chronically ill-appearing NAD HEENT: NCAT head, hearing intact, EOMI, Nasal cannulae in place Neck: supple, ROM intact, + 5cm JVD CV: Irregularly irregular, no murmurs Pulm: Bibasilar dullness to percussion, bibasilar rales, no wheezes Abd: Soft, BS+, NTND, No abdominal wall edema Extm: ROM intact, Peripheral pulses intact, 1+ pitting edema up to mid shin, warm extremitites Skin: Dry, normal turgor,  Neuro: AAOx3  Assessment/Plan:  Principal Problem:   Acute exacerbation of congestive heart failure (HCC) Active Problems:   Chronic atrial fibrillation   Pleural effusion   ICD (implantable cardioverter-defibrillator) in place   VT (ventricular tachycardia) (HCC)  Max Rivas is a 69 y.o. M with PMH of CAD s/p CABG,  ICM, chronic combined systolic/diastolic heart failure (EF 30-35%), persistent atrial fibrillation, intermittent episodes of VT s/p ICD, COPD (2L at home), HLD, HTN admit for acute on chronic systolic heart failure likely due to persistent tachycardia-induced myopathy with A.fib. Alternatively could be having worsening ischemic myopathy from failure of CABG although he has been adherent to home meds and no evidence of ACS on admission.  Acute on chronic hypoxic respiratory failure Acute on chronic systolic heart failure Ischemic cardiomyopathy s/p CABG Presents w/ worsening dyspnea. NYHA Class IV. Last echo w/ EF of 30-35% on post-CABG echo. Noted to have BNP >400 with pulmonary edema / pleural effusion on chest X-ray with worsening oxygen requirement from 2L at home to 4L. Concerning that he is so volume-overloaded despite recorded weight (100.2kg) being below his last known dry weight @ 107kg. Possibly due to worsening EF from his heart failure and loss of weight due to mal-nutrition. Will need repeat echo to assess function. Also appreciate cardiology team input - F/u echocardiogram - Appreciate card recs: Cardiovert while admit - C/w IV furosemide 40mg  BID - Trend BMP, Mag - Strict I&Os - Daily Weights - Fluid restriction - Keep O2 sat >88 - Replenish K as needed >4.0 - Hold home beta-blocker - C/w home meds: aspirin 81mg , atorvastatin 80mg   Persistent Atrial Fibrillation on chronic anticoagulation Recurrent episodes of V.tach s/p ICD On admission noted to be in A.fib w/ HR of 110s. Current HR in 80s. BP stable. CHADSVASC2 score of 5. On eliquis  at home. Per chart review, Max Rivas planned for DCCV prior to ablation (scheduled in October). [facility]. At home meds include amiodarone, mexiletine and metoprolol. C/w amiodarone for now, will hold beta-blocker in setting of acute HFrEF. Will defer to EP for any further rate/rhythm control. - Appreciate card recs -  Telemetry - C/w eliquis - C/w home amiodarone  Acute Kidney Injury on Chronic Kidney Disease stage 3a 2/2 hypervolemia At baseline, Bun 11, Creatinine 1.28 w/ GFR ~58. On admission noted to have elevated creatine at 1.61. Hypervolemic on exam. Improving w/ diuresis down to 1.4 - C/w diuresis as above - Trend renal fx - Avoid nephrotoxic meds when able  Hypothyroidism TSH wnl at 2.639 on admission. On at home - C/w home meds: levothyroxine daily  COPD No significant wheezing suggesting COPD exacerbation on exam - C/w home regimen  DVT prophx: eliquis Diet: HH Bowel: Miralax Code: DNR  Prior to Admission Living Arrangement: Home Anticipated Discharge Location: SNF Barriers to Discharge: Medical treatment Dispo: Anticipated discharge in approximately 3-4 day(s).   Max Barrio, MD 09/09/2019, 6:38 AM Pager: [phone] After 5pm on weekdays and 1pm on weekends: On Call Pager: [phone]

## 2019-09-09 NOTE — Progress Notes (Signed)
Echocardiogram 2D Echocardiogram has been performed.  Max Rivas 09/09/2019, 11:12 AM

## 2019-09-09 NOTE — Consult Note (Addendum)
Cardiology Consult    Patient ID: [name] MRN: [id number], DOB/AGE: [birth year]   Admit date: 09/08/2019 Date of Consult: 09/09/2019  Primary Physician: Anson Fret, MD Primary Cardiologist: Lesleigh Noe, MD Requesting Provider: Tyson Alias, MD  Patient Profile    Max Rivas is a 69 y.o. male with a history of CAD status post three-vessel bypass in May 2021, heart failure with midrange ejection fraction (40-45% May 2021), ischemic cardiomyopathy, ventricular tachycardia status post single lead AICD (last upgrade 2013), persistent atrial fibrillation on Eliquis and amiodarone, remote tobacco abuse, COPD on home O2, morbid obesity, and obstructive sleep apnea, who is being seen today for the evaluation of acute on chronic combined heart failure and rapid atrial fibrillation at the request of Dr. Oswaldo Done.  Past Medical History   Past Medical History:  Diagnosis Date  . Arthritis    "elbows, knees" (02/24/2016)  . Bronchitis 2006  . CAD (coronary artery disease)    a. s/p prior MIs - 1995 x 2, 1998; b. s/p prior LCX stenting; c. 04/2019 Cath: LM min irregs, LAD 65p/mi, D1 75, LCX 99ost/p, 68m (ISR), OM2 80, RCA/RPDA mod diff dzs; d. 05/2019 CABG x 3: LIMA->LAD, VG->Diag, VG->OM.  Marland Kitchen COPD (chronic obstructive pulmonary disease) (HCC)    a. Remote tobacco-->on home O2.  Marland Kitchen GERD (gastroesophageal reflux disease)   . 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 05/2019 Echo: EF 40-45%, gr2 DD. Nl RV size/fxn. Mildly dil LA. Mild MR.  . High cholesterol   . Ischemic cardiomyopathy    a. 05/2019 Echo: EF 40-45%.  . Morbid obesity (HCC)   . Myocardial infarction [facility]) ~ 1995 X 2;1998; 2000; 2004  . On home oxygen therapy    "2L w/activity" (02/24/2016)  . PAF (paroxysmal atrial fibrillation) (HCC)    a. CHA2DS2VASc = 4-->eliquis. Also on amio.  . Pulmonary embolism (HCC) 02/24/2016  . Sleep apnea    "have CPAP; can't tolerate it"  (02/24/2016)  . Ventricular tachycardia (HCC)    a. 2005 s/p ICD; b. 03/2011 Device upgrade/lead exchange: MDT Protecta XT VR single lead AICD.    Past Surgical History:  Procedure Laterality Date  . CLIPPING OF ATRIAL APPENDAGE N/A 05/23/2019   Procedure: CLIPPING OF ATRIAL APPENDAGE with atriclip;  Surgeon: [name], MD;  Location: [facility] OR;  Service: Open Heart Surgery;  Laterality: N/A;  . CORONARY ANGIOPLASTY  1995  . CORONARY ANGIOPLASTY WITH STENT PLACEMENT  ~ 1995 - 2004 X 5   "I've got a total of 5 stents" (02/24/2016)  . CORONARY ARTERY BYPASS GRAFT N/A 05/23/2019   Procedure: CORONARY ARTERY BYPASS GRAFTING (CABG), times three, using right greater saphenous vein and left internal mammary;  Surgeon: [name], MD;  Location: [facility] OR;  Service: Open Heart Surgery;  Laterality: N/A;  [place] only  . 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 INTRAVASCULAR PRESSURE WIRE/FFR STUDY N/A 05/12/2019   Procedure: INTRAVASCULAR PRESSURE WIRE/FFR STUDY;  Surgeon: Lyn Records, MD;  Location: [facility] INVASIVE CV LAB;  Service: Cardiovascular;  Laterality: N/A;  . LEFT HEART CATH AND CORONARY ANGIOGRAPHY N/A 05/12/2019   Procedure: LEFT HEART CATH AND CORONARY ANGIOGRAPHY;  Surgeon: Lyn Records, MD;  Location: [facility] INVASIVE CV LAB;  Service: Cardiovascular;  Laterality: N/A;  . PACEM[facility]  [facility]   . TEE WITHOUT  CARDIOVERSION N/A 05/23/2019   Procedure: TRANSESOPHAGEAL ECHOCARDIOGRAM (TEE);  Surgeon: [name], MD;  Location: [facility] OR;  Service: Open Heart Surgery;  Laterality: N/A;  . TONSILLECTOMY       Allergies  Allergies  Allergen Reactions  . Crestor [Rosuvastatin Calcium] Other (See Comments)    Back spasms, but can tolerate it at a low dose now    History of Present Illness    69 year old male with the above complex past medical history including CAD, heart failure with midrange ejection  fraction, ischemic cardiomyopathy, ventricular tachycardia status post single lead AICD (last upgrade 2013), persistent atrial fibrillation on Eliquis and amiodarone, remote tobacco abuse, COPD on home O2, morbid obesity, and obstructive sleep apnea.  Cardiac history dates back to the mid 90s, when he suffered 2 myocardial infarctions in approximately 1995 with a third in 1998.  He previously underwent circumflex stenting.  Much of his care has been provided at the [facility].  He initially underwent single lead AICD placement in 2005 in the setting of cardiomyopathy and ventricular tachycardia.  His device was upgraded in 2013 with lead change in the setting of a [employer] lead fracture.  In April of this year, he was evaluated with diagnostic catheterization in the setting of unstable angina revealing severe multivessel disease including in-stent restenosis in the left circumflex.  He was evaluated by CT surgery and subsequently underwent CABG x3 with a LIMA to the LAD, and separate vein grafts to the obtuse marginal and diagonal.  Preoperative echo showed an EF of 40-45% with grade 2 diastolic dysfunction.  Patient says he did reasonably well postoperatively but in mid July, he had a hypoxic episode in the setting of running out of oxygen which was followed by an ICD shock.  Since then, he has noticed persistent dyspnea on exertion, malaise, and some dyspnea at rest, especially when speaking or eating.  In this setting, he has had early satiety.  He was seen in electrophysiology clinic on August 20 and device interrogation showed persistent atrial fibrillation.  Previous ICD shocks were noted for ventricular tachycardia.  His amiodarone was increased to 200 mg twice daily with a plan for cardioversion in early September to be followed by VT ablation (he is also on mexiletine).    Unfortunately, since his office visit August 20, he has continued to feel poorly.  He notes that with ambulation,  even with oxygen running, it is common for his saturations to drop into the mid 60s.  By increasing oxygen to 6 L/min and resting, he can get saturations up into the 90s within a few minutes.  He has also been noting mild increase in lower ext edema.  Yesterday, August 27, he ambulated in his home and had recurrence of severe dyspnea however this time, it was associated with profound lightheadedness/presyncope.  He felt as though he might lose consciousness.  Again, he noted a saturation in the 60s upon sitting and resting.  He continued to feel somewhat lightheaded and called EMS.  He was taken to the emergency department where he is found to be in atrial fibrillation with rates in the 70s to 150s.  His twelve-lead showed A. fib at 117 with prior anteroseptal infarct and mild inferolateral ST depression which was slightly more pronounced at higher rates.  Creatinine was elevated above prior baseline at 1.61 (1.27 in August 16).  BNP elevated at 455.  High-sensitivity troponin normal at 12  13.  Chest x-ray showed cardiomegaly, COPD/chronic lung disease,  bilateral pleural effusions enlarged since prior study.  He has received 2 doses of lasix 40mg  IV since yesterday w/ good response, though he is still quite dyspneic w/ speech and orthopneic.  No chest pain or palpitations.  Inpatient Medications    . amiodarone  200 mg Oral Daily  . apixaban  5 mg Oral BID  . aspirin  81 mg Oral Daily  . atorvastatin  80 mg Oral Daily  . budesonide  0.5 mg Inhalation BID  . furosemide  40 mg Intravenous BID  . gabapentin  200 mg Oral BID  . levothyroxine  75 mcg Oral QAC breakfast  . montelukast  10 mg Oral Daily  . potassium chloride  40 mEq Oral Q6H  . sodium chloride flush  3 mL Intravenous Q12H    Family History    Family History  Problem Relation Age of Onset  . Heart attack Mother   . Heart attack Father   . Heart attack Sister 86   He indicated that his mother is deceased. He indicated that his  father is deceased. He indicated that the status of his sister is unknown.   Social History    Social History   Socioeconomic History  . Marital status: Divorced    Spouse name: Not on file  . Number of children: Not on file  . Years of education: Not on file  . Highest education level: Not on file  Occupational History  . Not on file  Tobacco Use  . Smoking status: Current Some Day Smoker    Packs/day: 3.00    Years: 35.00    Pack years: 105.00    Types: Cigarettes  . Smokeless tobacco: Former 59    Quit date: 02/13/2017  Vaping Use  . Vaping Use: Never used  Substance and Sexual Activity  . Alcohol use: Not Currently  . Drug use: Not Currently    Types: Cocaine    Comment: none since 1995  . Sexual activity: Yes  Other Topics Concern  . Not on file  Social History Narrative   Lives locally with family.  Does not routinely exercise.   Social Determinants of Health   Financial Resource Strain:   . Difficulty of Paying Living Expenses: Not on file  Food Insecurity:   . Worried About 1996 in the Last Year: Not on file  . Ran Out of Food in the Last Year: Not on file  Transportation Needs:   . Lack of Transportation (Medical): Not on file  . Lack of Transportation (Non-Medical): Not on file  Physical Activity:   . Days of Exercise per Week: Not on file  . Minutes of Exercise per Session: Not on file  Stress:   . Feeling of Stress : Not on file  Social Connections:   . Frequency of Communication with Friends and Family: Not on file  . Frequency of Social Gatherings with Friends and Family: Not on file  . Attends Religious Services: Not on file  . Active Member of Clubs or Organizations: Not on file  . Attends Programme researcher, broadcasting/film/video Meetings: Not on file  . Marital Status: Not on file  Intimate Partner Violence:   . Fear of Current or Ex-Partner: Not on file  . Emotionally Abused: Not on file  . Physically Abused: Not on file  . Sexually Abused:  Not on file     Review of Systems    General:  No chills, fever, night sweats or weight changes.  Cardiovascular:  No chest pain, +++ dyspnea on exertion, +++ lower ext edema, +++ orthopnea, occas palpitations, +++ paroxysmal nocturnal dyspnea, +++ presyncope. Dermatological: No rash, lesions/masses Respiratory: No cough, dyspnea Urologic: No hematuria, dysuria Abdominal:   No nausea, vomiting, diarrhea, bright red blood per rectum, melena, or hematemesis Neurologic:  No visual changes, wkns, changes in mental status. All other systems reviewed and are otherwise negative except as noted above.  Physical Exam    Blood pressure 107/74, pulse 76, temperature 97.6 F (36.4 C), temperature source Oral, resp. rate 14, weight 100.2 kg, SpO2 96 %.  General: Pleasant, dyspneic w/ speech. Psych: flat affect. Neuro: Alert and oriented X 3. Moves all extremities spontaneously. HEENT: Normal  Neck: Supple without bruits.  JVP mod elevated.   Lungs:  Resp regular and unlabored, coarse breath sounds and exp wheezing throughout A/P. Heart: IR, IR, distant, no s3, s4, or murmurs. Abdomen: Obese, Soft, non-tender, non-distended, BS + x 4.  Extremities: No clubbing, cyanosis.  2+ bilat ankle edema. DP/PT/Radials 1+ and equal bilaterally.  Labs    Cardiac Enzymes Recent Labs  Lab 08/28/19 0058 09/08/19 1822 09/08/19 2133  TROPONINIHS 15 12 13      Lab Results  Component Value Date   WBC 7.8 09/08/2019   HGB 13.8 09/08/2019   HCT 44.1 09/08/2019   MCV 92.8 09/08/2019   PLT 215 09/08/2019    Recent Labs  Lab 09/08/19 1822 09/08/19 1822 09/09/19 0618  NA 138   < > 138  K 3.7   < > 3.1*  CL 99   < > 99  CO2 28   < > 27  BUN 17   < > 15  CREATININE 1.61*   < > 1.40*  CALCIUM 9.1   < > 8.6*  PROT 6.8  --   --   BILITOT 1.1  --   --   ALKPHOS 76  --   --   ALT 27  --   --   AST 25  --   --   GLUCOSE 125*   < > 94   < > = values in this interval not displayed.   Lab Results    Component Value Date   CHOL 126 05/13/2019   HDL 36 (L) 05/13/2019   LDLCALC 70 05/13/2019   TRIG 101 05/13/2019   Lab Results  Component Value Date   DDIMER 3.99 (H) 02/24/2016     Radiology Studies    DG Chest 2 View  Result Date: 08/28/2019 CLINICAL DATA:  Chest pain. EXAM: CHEST - 2 VIEW COMPARISON:  Most recent radiograph 06/28/2019.  Chest CT 07/25/2018 FINDINGS: Median sternotomy. Clipping of left atrial appendage. Left-sided pacemaker in place with unchanged leads. Stable cardiomediastinal contours with upper normal heart size. Chronic right pleural thickening and pleural calcifications. Scarring at the right lung base. Small left pleural effusion has improved from prior exam. No pulmonary edema or acute airspace disease. No pneumothorax. No acute osseous abnormalities are seen. IMPRESSION: 1. No acute findings. 2. Small left pleural effusion has improved from prior exam. 3. Chronic right pleural thickening, pleural calcifications, and basilar scarring. Electronically Signed   By: Melanie  Sanford M.D.   On: 08/28/2019 01:33   DG Chest Port 1 View  Result Date: 09/08/2019 CLINICAL DATA:  Shortness of breath EXAM: PORTABLE CHEST 1 VIEW COMPARISON:  08/28/2019 FINDINGS: Left AICD remains in place, unchanged. Cardiomegaly, vascular congestion. There is hyperinflation of the lungs compatible with COPD. Probable underlying fibrosis within the   lungs. Small right effusion and moderate left effusion. Mid and lower lung increased interstitial markings and airspace disease likely reflect superimposed edema. IMPRESSION: Cardiomegaly. COPD/chronic lung disease. Bilateral pleural effusions, enlarged since prior study, moderate on the left. Probable mild superimposed pulmonary edema. Electronically Signed   By: Kevin  Dover M.D.   On: 09/08/2019 18:05    ECG & Cardiac Imaging    Afib, 117, anteroseptal infarct, inflat ST depression - slightly more pronounced than on prior episodes - personally  reviewed.  Assessment & Plan    1.  Acute on chronic heart failure with midrange ejection fraction/ischemic cardiomyopathy: Patient with an EF of 40-45% by echo in May 2021, who presented to the emergency department yesterday after an episode of presyncope and hypoxia.  He has been experiencing worsening fatigue, dyspnea, malaise and more recent lower extremity swelling since mid July following an episode of VT requiring ICD shock.  He has also been in atrial fibrillation since that time.  Recently seen in the outpatient setting with plan for cardioversion in early September, as it was felt to likely be driving symptoms.  He remains in atrial fibrillation at this time with rates in the 1 teens and is being admitted by the medicine service.  He is very dyspneic w/ speech and his lungs are quite tight w/ significant exp wheezing.  Cont IV diuresis.  2.  Persistent atrial fibrillation with rapid ventricular response: Noted on recent clinic visit but dating back to July.  Amiodarone dose was increased to 200 mg twice daily at clinic visit on August 20, with plan for outpatient cardioversion in early September.  Rates in the ED are in the 90s to 1 teens currently in the setting of ongoing dyspnea and wheezing.  We had considered cardioverting in the emergency department however, with severe COPD and hypoxic respiratory failure at baseline, this will be deferred until anesthesia can be on hand on Monday.  Plan diuresis over this weekend.  Continue amiodarone and Eliquis.  Of note, he has not missed any Eliquis doses.  3.  Presyncope/ventricular tachycardia: Patient experienced profound presyncope in the setting of hypoxia which prompted his call to EMS.  He says his saturation dropped into the 60s.  He has experienced similar hypoxia on a somewhat regular basis when ambulating at home but does not usually experience presyncope.  He did feel like his heart was racing though, he did not require ICD shock.  His  device was interrogated in the emergency department though at this time, I cannot locate that paperwork.  Continue amiodarone and mexiletine.  Plans for VT ablation at some point in September per Dr. Taylor.  His device is approaching elective replacement indicators and patient plans to have that changed out at [facility].  4.  Coronary artery disease: Status post three-vessel bypass in May.  Despite rapid rates, heart failure, and hypoxia, troponins are normal.  Continue aspirin and statin therapy.  5.  Acute on chronic hypoxic respiratory failure/COPD: Complicated by volume excess.  His lungs are very tight with expiratory wheezing throughout anteriorly and posteriorly.  Inhaler/nebulizer therapy per internal medicine.  I suspect he might benefit from steroids.  6.  Hyperlipidemia: LDL of 70 in May.  Continue statin therapy.  7.  Acute kidney injury: Creatinine 1.61 on arrival yesterday evening, which was up from 1.27 on August 16.  With diuresis, this is improved and he is 1.40 this morning.  Follow with ongoing diuresis.  Signed, Christopher Berge, NP 09/09/2019, 11:32 AM    For questions or updates, please contact   Please consult [email or web address] for contact info under Cardiology/STEMI.    I have seen and examined the patient along with Nicolasa Ducking, NP.  I have reviewed the chart, notes and new data.  I agree with PA/NP's note.  Key new complaints: breathing much better after bronchodilators Key examination changes: very distant breath sounds, but no longer wheezing Key new findings / data: performed in person ICD check. MDT single chamber device with normal function. Very close to RRT at 2.63 V. Normal lead parameters. Heart rate histogram shifted left c/w AF w RVR/. Optivol invalid since CABG.   No VT/VF since last device interrogation. Most recent syncope was probably due to hypoxia.  PLAN: AFib is well rate controlled and he is anticoagulated. He would  benefit from restoration of normal rhythm due to CHF. Has eaten and has severe chronic lung disease. Will plan DCCV with Anesthesiology support on Monday.  Thurmon Fair, MD, [facility] [phone] 09/09/2019, 2:30 PM

## 2019-09-09 NOTE — ED Notes (Addendum)
RN found that pt had turned over in bed and pulled his O2 out of the wall.  He did not appear to be in respiratory distress however after placing back on 3L (home O2) he took a long time to improve to 85%.  Increased O2 to 4L.  Will continue to monitor.  Provider aware

## 2019-09-09 NOTE — Procedures (Signed)
Patient has refused Bipap for tonight. 

## 2019-09-10 DIAGNOSIS — I482 Chronic atrial fibrillation, unspecified: Secondary | ICD-10-CM

## 2019-09-10 DIAGNOSIS — I2510 Atherosclerotic heart disease of native coronary artery without angina pectoris: Secondary | ICD-10-CM

## 2019-09-10 DIAGNOSIS — R0902 Hypoxemia: Secondary | ICD-10-CM

## 2019-09-10 DIAGNOSIS — N179 Acute kidney failure, unspecified: Secondary | ICD-10-CM

## 2019-09-10 DIAGNOSIS — I5043 Acute on chronic combined systolic (congestive) and diastolic (congestive) heart failure: Secondary | ICD-10-CM

## 2019-09-10 LAB — BASIC METABOLIC PANEL
Anion gap: 12 (ref 5–15)
BUN: 16 mg/dL (ref 8–23)
CO2: 27 mmol/L (ref 22–32)
Calcium: 9.5 mg/dL (ref 8.9–10.3)
Chloride: 98 mmol/L (ref 98–111)
Creatinine, Ser: 1.5 mg/dL — ABNORMAL HIGH (ref 0.61–1.24)
GFR calc Af Amer: 55 mL/min — ABNORMAL LOW (ref >60)
GFR calc non Af Amer: 47 mL/min — ABNORMAL LOW (ref >60)
Glucose, Bld: 102 mg/dL — ABNORMAL HIGH (ref 70–99)
Potassium: 5 mmol/L (ref 3.5–5.1)
Sodium: 137 mmol/L (ref 135–145)

## 2019-09-10 LAB — CBC
HCT: 44 % (ref 39.0–52.0)
Hemoglobin: 13.8 g/dL (ref 13.0–17.0)
MCH: 28.9 pg (ref 26.0–34.0)
MCHC: 31.4 g/dL (ref 30.0–36.0)
MCV: 92.2 fL (ref 80.0–100.0)
Platelets: 215 10*3/uL (ref 150–400)
RBC: 4.77 MIL/uL (ref 4.22–5.81)
RDW: 15.5 % (ref 11.5–15.5)
WBC: 10.6 10*3/uL — ABNORMAL HIGH (ref 4.0–10.5)
nRBC: 0 % (ref 0.0–0.2)

## 2019-09-10 LAB — HEMOGLOBIN A1C
Hgb A1c MFr Bld: 6.2 % — ABNORMAL HIGH (ref 4.8–5.6)
Mean Plasma Glucose: 131.24 mg/dL

## 2019-09-10 LAB — GLUCOSE, CAPILLARY
Glucose-Capillary: 96 mg/dL (ref 70–99)
Glucose-Capillary: 96 mg/dL (ref 70–99)

## 2019-09-10 LAB — MAGNESIUM: Magnesium: 2.3 mg/dL (ref 1.7–2.4)

## 2019-09-10 MED ORDER — IPRATROPIUM-ALBUTEROL 0.5-2.5 (3) MG/3ML IN SOLN
3.0000 mL | RESPIRATORY_TRACT | Status: DC | PRN
  Administered 2019-09-10: 3 mL via RESPIRATORY_TRACT

## 2019-09-10 MED ORDER — PANTOPRAZOLE SODIUM 40 MG PO TBEC
40.0000 mg | DELAYED_RELEASE_TABLET | Freq: Two times a day (BID) | ORAL | Status: DC
  Administered 2019-09-10 – 2019-09-11 (×4): 40 mg via ORAL
  Filled 2019-09-10 (×4): qty 1

## 2019-09-10 MED ORDER — FUROSEMIDE 80 MG PO TABS
80.0000 mg | ORAL_TABLET | Freq: Every day | ORAL | Status: DC
  Administered 2019-09-10 – 2019-09-11 (×2): 80 mg via ORAL
  Filled 2019-09-10 (×2): qty 1

## 2019-09-10 MED ORDER — PANTOPRAZOLE SODIUM 40 MG PO TBEC: 40.0000 mg | DELAYED_RELEASE_TABLET | Freq: Every day | ORAL | Status: DC

## 2019-09-10 NOTE — Evaluation (Signed)
Occupational Therapy Evaluation Patient Details Name: Max Rivas MRN: [id number] DOB: 1950/05/23 Today's Date: 09/10/2019    History of Present Illness  Patient experienced profound presyncope in the setting of hypoxia which prompted his call to EMS. 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   Clinical Impression   This 69 y/o male presents with the above. PTA pt reports being mod independent with ADL and mobility. Pt very pleasant and willing to participate in therapy session, mostly with limitations due to cardiorespiratory status and increased DOE with minimal activity. Pt demonstrating functional transfers without AD at [facility] assist level (limited to stand pivots given increased WOB), requiring minguard assist for LB ADL. Pt on 5L O2 during session with lowest noted SpO2 84% with minimal activity - given increased time (approx 3 min), seated rest and pursed lip breathing Spo2 returning to >/=91% and remaining 91-92% end of session. Pt to benefit from continued acute OT services and currently recommend HHOT services after discharge to progress his safety and independence with ADL and mobility.     Follow Up Recommendations  Home health OT    Equipment Recommendations  None recommended by OT           Precautions / Restrictions Precautions Precautions: Fall Precaution Comments: Monitor O2 sats/activity tolerance Restrictions Weight Bearing Restrictions: No       Mobility Bed Mobility Overal bed mobility: Needs Assistance Bed Mobility: Rolling Rolling: Modified independent (Device/Increase time)         General bed mobility comments: rolling from sitting EOB on one side to the other side during session   Transfers Overall transfer level: Needs assistance Equipment used: None Transfers: Sit to/from [employer] Sit to Stand: Min guard Stand pivot transfers: Min guard       General transfer comment: guarding for safety, lines and balance     Balance Overall balance assessment: Mild deficits observed, not formally tested                                         ADL either performed or assessed with clinical judgement   ADL Overall ADL's : Needs assistance/impaired Eating/Feeding: Modified independent;Sitting   Grooming: Set up;Sitting   Upper Body Bathing: Set up;Sitting   Lower Body Bathing: Min guard;Sit to/from stand   Upper Body Dressing : Set up;Sitting   Lower Body Dressing: Min guard;Sit to/from stand Lower Body Dressing Details (indicate cue type and reason): able to reach and adjust socks seated in recliner Toilet Transfer: Min Manufacturing systems engineer Details (indicate cue type and reason): stand pivot vs ambulation due to increased dyspnea with minimal activity  Toileting- Clothing Manipulation and Hygiene: Min guard;Sitting/lateral lean;Sit to/from stand       Functional mobility during ADLs: Min guard (stand pivot ) General ADL Comments: pt mostly limited due to cardiorespiratory status and increased DOE with minimal activity                   Pertinent Vitals/Pain Pain Assessment: No/denies pain  Hand Dominance     Extremity/Trunk Assessment Upper Extremity Assessment Upper Extremity Assessment: Generalized weakness   Lower Extremity Assessment Lower Extremity Assessment: Defer to PT evaluation;Generalized weakness       Communication  Communication Communication: No difficulties   Cognition Arousal/Alertness: Awake/alert Behavior During Therapy: WFL for tasks assessed/performed Overall Cognitive Status: Within Functional Limits for tasks assessed                                     General Comments       Exercises     Shoulder Instructions      Home Living Family/patient expects to be discharged to:: Private residence Living Arrangements: Non-relatives/Friends Available Help at Discharge: Friend(s) Type of Home: House Home Access: Stairs to enter Secretary/administrator of Steps: 3 Entrance Stairs-Rails: Right;Left;Can reach both Home Layout: One level     Bathroom Shower/Tub: Producer, television/film/video: Handicapped height     Home Equipment: Grab bars - tub/shower;Shower seat;Bedside commode;Hand held shower head          Prior Functioning/Environment Level of Independence: Independent        Comments: On supplemental O2 at baseline; tells me he uses 3 L at rest, and turns up to 4 L with activity        OT Problem List: Decreased strength;Decreased activity tolerance;Impaired balance (sitting and/or standing);Cardiopulmonary status limiting activity;Obesity      OT Treatment/Interventions: Self-care/ADL training;Therapeutic exercise;Energy conservation;DME and/or AE instruction;Therapeutic activities;Balance training;Patient/family education    OT Goals(Current goals can be found in the care plan section) Acute Rehab OT Goals Patient Stated Goal: Hopes tomorrow's procedure goeal well OT Goal Formulation: With patient Time For Goal Achievement: 09/24/19 Potential to Achieve Goals: Good  OT Frequency: Min 2X/week   Barriers to D/C:            Co-evaluation              AM-PAC OT "6 Clicks" Daily Activity     Outcome Measure Help from another person eating meals?: None Help from another person taking care of personal grooming?: A Little Help from another  person toileting, which includes using toliet, bedpan, or urinal?: A Little Help from another person bathing (including washing, rinsing, drying)?: A Little Help from another person to put on and taking off regular upper body clothing?: A Little Help from another person to put on and taking off regular lower body clothing?: A Little 6 Click Score: 19   End of Session Equipment Utilized During Treatment: Oxygen Nurse Communication: Mobility status  Activity Tolerance: Patient tolerated treatment well Patient left: in bed;with call bell/phone within reach;Other (comment) (seated EOB)  OT Visit Diagnosis: Muscle weakness (generalized) (M62.81);Other (comment) (decreased activity tolerance )                Time: 7169-6789 OT Time Calculation (min): 14 min Charges:  OT General Charges $OT Visit: 1 Visit OT Evaluation $OT Eval Moderate Complexity: 1 Mod  Marcy Siren, OT Acute Rehabilitation Services Pager [phone] Office [phone]   Orlando Penner 09/10/2019, 5:14 PM

## 2019-09-10 NOTE — Evaluation (Signed)
Physical Therapy Evaluation Patient Details Name: Max Rivas MRN: [id number] DOB: Mar 13, 1950 Today's Date: 09/10/2019   History of Present Illness   Patient experienced profound presyncope in the setting of hypoxia which prompted his call to EMS. 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  Clinical Impression   Pt admitted with above diagnosis. Comes from home where he lives with friend; single level home with level entries; Uses supplemental O2 at baseline; Presents to PT with decr activity tolerance, generalized weakness, fatigues very quickly; DOE 3/5 with short walk in room around the bed to the recliner; O2 sats decr to 78% (lowest observed); Once sitting, O2 sats incr to 90%with seated rest for about 5 minutes;   Pt currently with functional limitations due to the deficits listed below (see PT Problem List). Pt will benefit from skilled PT to increase their independence and safety with mobility to allow discharge to the venue listed below.       Follow Up Recommendations Home health PT    Equipment Recommendations  Other (comment) (Rollator RW)    Recommendations for Other Services OT consult (as ordered)     Precautions / Restrictions Precautions Precautions: Fall Precaution Comments: Monitor O2 sats/activity tolerance      Mobility  Bed Mobility               General bed mobility comments: Recieved pt sitting EOB  Transfers Overall transfer  level: Needs assistance Equipment used: None Transfers: Sit to/from Stand Sit to Stand: Min assist         General transfer comment: Cues to self-monitor for activity tolerance; Min assist for help with lines and monitoring  Ambulation/Gait Ambulation/Gait assistance: Min guard Gait Distance (Feet): 10 Feet (around bed) Assistive device: None Gait Pattern/deviations: Decreased step length - right;Decreased step length - left     General Gait Details: Fatigued quite quickly, good self-monitor for activity tolerance; O2 sats decr to 78% with amb on 4L, titrated to 6L, which helped to bolster O2 sats back to 88-90%  Stairs            Wheelchair Mobility    Modified Rankin (Stroke Patients Only)       Balance Overall balance assessment: Mild deficits observed, not formally tested                                           Pertinent Vitals/Pain Pain Assessment: No/denies pain    Home Living Family/patient expects to be discharged to:: Private residence Living Arrangements: Non-relatives/Friends Available Help at Discharge: Friend(s) Type of Home: House Home Access: Stairs to enter Entrance Stairs-Rails: Right;Left;Can reach both Secretary/administrator of Steps: 3 Home Layout: One level Home Equipment: Grab bars - tub/shower      Prior Function Level of Independence: Independent         Comments: On supplemental O2 at baseline; tells me he uses 3 L at rest, and turns up to 4 L with activity     Hand Dominance  Extremity/Trunk Assessment   Upper Extremity Assessment Upper Extremity Assessment: Generalized weakness    Lower Extremity Assessment Lower Extremity Assessment: Generalized weakness       Communication   Communication: No difficulties  Cognition Arousal/Alertness: Awake/alert Behavior During Therapy: WFL for tasks assessed/performed Overall Cognitive Status: Within Functional Limits for tasks assessed                                         General Comments      Exercises     Assessment/Plan    PT Assessment Patient needs continued PT services  PT Problem List Decreased strength;Decreased activity tolerance;Decreased balance;Decreased mobility;Decreased coordination;Decreased cognition;Decreased knowledge of use of DME;Decreased safety awareness;Decreased knowledge of precautions;Cardiopulmonary status limiting activity       PT Treatment Interventions DME instruction;Gait training;Stair training;Functional mobility training;Therapeutic activities;Therapeutic exercise;Balance training;Patient/family education    PT Goals (Current goals can be found in the Care Plan section)  Acute Rehab PT Goals Patient Stated Goal: Hopes tomorrow's procedure goeal well PT Goal Formulation: With patient Time For Goal Achievement: 09/24/19 Potential to Achieve Goals: Poor    Frequency Min 3X/week   Barriers to discharge        Co-evaluation               AM-PAC PT "6 Clicks" Mobility  Outcome Measure Help needed turning from your back to your side while in a flat bed without using bedrails?: None Help needed moving from lying on your back to sitting on the side of a flat bed without using bedrails?: A Little Help needed moving to and from a bed to a chair (including a wheelchair)?: A Little Help needed standing up from a chair using your arms (e.g., wheelchair or bedside chair)?: A Little Help needed to walk in hospital room?: A Little Help needed climbing 3-5 steps with a railing? : A Lot 6 Click Score: 18    End of Session Equipment Utilized During Treatment: Oxygen Activity Tolerance: Patient limited by fatigue;Other (comment) (O2 sat drop with activity) Patient left: in chair;with call bell/phone within reach Nurse Communication: Mobility status PT Visit Diagnosis: Other abnormalities of gait and mobility (R26.89)    Time: 5093-2671 PT Time Calculation (min) (ACUTE  ONLY): 17 min   Charges:   PT Evaluation $PT Eval Moderate Complexity: 1 Mod          Van Clines, PT  Acute Rehabilitation Services Pager [phone] Office [phone]   Max Rivas 09/10/2019, 12:50 PM

## 2019-09-10 NOTE — Progress Notes (Addendum)
   Subjective:  Patient seen at bedside this AM. Reports episode of acid reflux overnight, states he usually takes Prilosec at home. Otherwise, says he's doing well, just waiting to find out when DCCV will take place. Mentions he is hopeful it will occur today v tomorrow as he just heard via the news that elective procedures might be cancelled.  Objective:  Vital signs in last 24 hours: Vitals:   09/09/19 1930 09/09/19 2059 09/10/19 0018 09/10/19 0514  BP: 97/66  121/75 101/74  Pulse: 73  94 86  Resp:   18 20  Temp: (!) 97.5 F (36.4 C)  97.6 F (36.4 C) (!) 97.5 F (36.4 C)  TempSrc: Oral  Oral Oral  SpO2: 95% 93% 92% 94%  Weight:    101 kg  Height:       Physical Exam: General: Sitting on side of bed, pleasant, no acute distress CV: Normal rate, irregular rhythm. No m/r/g Pulm: Clear to auscultation bilaterally, no rales appreciated. Abdomen: Soft, non-tender, non-distended MSK: 2+ pitting edema to shin on R leg, trace edema on L leg.  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  #Acute on chronic hypoxic respiratory failure #Acute on chronic systolic heart failure #Ischemic cardiomyopathy s/p CABG NYHA class IV. Patient reports he is breathing okay this AM. No rales or wheezing appreciated on exam, pitting edema appears to be improved from yesterday. UOP over last 24 hours appropriate, 1300cc. Increase in Cr today, will switch to po lasix per cards. TTE  yesterday w/ EF 30-35%, global hypokinesis, grade 1 diastolic dysfunction. No acute changes from TEE in May. Cardiology following, plan for DCCV tomorrow. Appreciate cards recs. -DCCV tomorrow -po lasix -C/w ASA 67m qd, Lipitor 825mqd -Trend BMP, Mg, replete electrolytes PRN -Hold home bb -I&Os, daily weights  #Persistent Afib, on chronic anticoagulation #Recurrent episodes of VTach s/p ICD CHADsVASC 5, continuing home eliquis. HR normal, BP stable over last 24hr. Patient to have ablation via Dr. TaLovena Len September, possibly replacement of ICD. Holding beta blocker with acute heart failure exacerbation, but will continue with other home meds. Appreciate EP for assistance with rate, rhythm control. -C/w eliquis, amiodarone -Tele  #AKI on CKD IIIa 2/2 hypervolemia sCr this AM 1.5 (1.4 yesterday) w/ eGFR 47, baseline sCr ~1.28. eGFR ~58. Patient does not have rales on exam, but continues to have pitting edema. Will switch to po lasix, can discuss with cardiology further recs. -Switch to po lasix per cards -Avoid nephrotoxic meds  Chronic: #Hypothyroidism -TSH wnl on admission, continue home levothyroxine [id number]   #COPD -No evidence of COPD exacerbation, continue with home regimen  Diet: HH, CM IVF: n/a DVT PPX: Eliquis Code Status: DNR Prior to Admission Living Arrangement: home Anticipated Discharge Location: home v SNF Barriers to Discharge: medical management Dispo: Anticipated discharge in approximately 2-3 day(s).   Max Rivas 09/10/2019, 6:44 AM Pager: [phone] 5pm on weekdays and 1pm on weekends: On Call pager [phone]

## 2019-09-10 NOTE — Progress Notes (Signed)
Patient ID: DAJION BICKFORD, male   DOB: April 05, 1950, 69 y.o.   MRN: [id number]   Progress Note  Patient Name: Max Rivas Date of Encounter: 09/10/2019  Primary Cardiologist: Lyn Records III, MD   Subjective   No chest pain or sob. Rates are improved.  Inpatient Medications    Scheduled Meds: . amiodarone  200 mg Oral Daily  . apixaban  5 mg Oral BID  . aspirin  81 mg Oral Daily  . atorvastatin  80 mg Oral Daily  . budesonide  0.5 mg Inhalation BID  . furosemide  40 mg Intravenous BID  . gabapentin  200 mg Oral BID  . levothyroxine  75 mcg Oral QAC breakfast  . montelukast  10 mg Oral Daily  . pantoprazole  40 mg Oral BID  . sodium chloride flush  3 mL Intravenous Q12H   Continuous Infusions: . sodium chloride Stopped (09/09/19 2341)  . ferumoxytol Stopped (09/09/19 2341)   PRN Meds: sodium chloride, acetaminophen, alum & mag hydroxide-simeth, levalbuterol, ondansetron (ZOFRAN) IV, sodium chloride flush   Vital Signs    Vitals:   09/10/19 0018 09/10/19 0514 09/10/19 0812 09/10/19 0925  BP: 121/75 101/74 116/75   Pulse: 94 86    Resp: 18 20 17    Temp: 97.6 F (36.4 C) (!) 97.5 F (36.4 C)    TempSrc: Oral Oral    SpO2: 92% 94%  92%  Weight:  101 kg    Height:        Intake/Output Summary (Last 24 hours) at 09/10/2019 0953 Last data filed at 09/10/2019 0500 Gross per 24 hour  Intake 576 ml  Output 1300 ml  Net -724 ml   Filed Weights   09/08/19 2305 09/09/19 1508 09/10/19 0514  Weight: 100.2 kg 100 kg 101 kg    Telemetry    Atrial fib with a controlled VR - Personally Reviewed  ECG    Atrial fib with a controlled vr - Personally Reviewed  Physical Exam   GEN: No acute distress.   Neck: No JVD Cardiac: IRIR, no murmurs, rubs, or gallops.  Respiratory: Clear to auscultation bilaterally. GI: Soft, nontender, non-distended  MS: No edema; No deformity. Neuro:  Nonfocal  Psych: Normal affect   Labs    Chemistry Recent Labs  Lab  09/08/19 1822 09/09/19 0618 09/10/19 0041  NA 138 138 137  K 3.7 3.1* 5.0  CL 99 99 98  CO2 28 27 27   GLUCOSE 125* 94 102*  BUN 17 15 16   CREATININE 1.61* 1.40* 1.50*  CALCIUM 9.1 8.6* 9.5  PROT 6.8  --   --   ALBUMIN 3.6  --   --   AST 25  --   --   ALT 27  --   --   ALKPHOS 76  --   --   BILITOT 1.1  --   --   GFRNONAA 44* 52* 47*  GFRAA 51* 60* 55*  ANIONGAP 11 12 12      Hematology Recent Labs  Lab 09/08/19 1822 09/10/19 0041  WBC 7.8 10.6*  RBC 4.75 4.77  HGB 13.8 13.8  HCT 44.1 44.0  MCV 92.8 92.2  MCH 29.1 28.9  MCHC 31.3 31.4  RDW 15.3 15.5  PLT 215 215    Cardiac EnzymesNo results for input(s): TROPONINI in the last 168 hours. No results for input(s): TROPIPOC in the last 168 hours.   BNP Recent Labs  Lab 09/08/19 1822  BNP 455.0*  DDimer No results for input(s): DDIMER in the last 168 hours.   Radiology    DG Chest Port 1 View  Result Date: 09/08/2019 CLINICAL DATA:  Shortness of breath EXAM: PORTABLE CHEST 1 VIEW COMPARISON:  08/28/2019 FINDINGS: Left AICD remains in place, unchanged. Cardiomegaly, vascular congestion. There is hyperinflation of the lungs compatible with COPD. Probable underlying fibrosis within the lungs. Small right effusion and moderate left effusion. Mid and lower lung increased interstitial markings and airspace disease likely reflect superimposed edema. IMPRESSION: Cardiomegaly. COPD/chronic lung disease. Bilateral pleural effusions, enlarged since prior study, moderate on the left. Probable mild superimposed pulmonary edema. Electronically Signed   By: Charlett Nose M.D.   On: 09/08/2019 18:05   ECHOCARDIOGRAM COMPLETE  Result Date: 09/09/2019    ECHOCARDIOGRAM REPORT   Patient Name:   MAKAIL WATLING Date of Exam: 09/09/2019 Medical Rec #:  [id number]      Height:       71.0 in Accession #:    [id number]     Weight:       221.0 lb Date of Birth:  1950/10/07      BSA:          2.200 m Patient Age:    69 years       BP:            107/74 mmHg Patient Gender: M              HR:           84 bpm. Exam Location:  Inpatient Procedure: 2D Echo, Color Doppler, Cardiac Doppler and Intracardiac            Opacification Agent Indications:    I48.91* Unspecified atrial fibrillation  History:        Patient has prior history of Echocardiogram examinations, most                 recent 05/23/2019. Pacemaker and Defibrillator, COPD,                 Arrythmias:Atrial Fibrillation; Risk Factors:Dyslipidemia and                 Sleep Apnea.  Sonographer:    Irving Burton Senior RDCS Referring Phys: [phone] [facility]  Sonographer Comments: Technically difficult, patient scanned sitting up due to difficulty breathing. IMPRESSIONS  1. Left ventricular ejection fraction, by estimation, is 30 to 35%. The left ventricle has moderately decreased function. The left ventricle demonstrates global hypokinesis. The left ventricular internal cavity size was moderately dilated. Left ventricular diastolic parameters are consistent with Grade I diastolic dysfunction (impaired relaxation).  2. Right ventricular systolic function is moderately reduced. The right ventricular size is mildly enlarged. There is moderately elevated pulmonary artery systolic pressure.  3. Left atrial size was severely dilated.  4. Right atrial size was mildly dilated.  5. The mitral valve is normal in structure. Mild mitral valve regurgitation. No evidence of mitral stenosis.  6. The aortic valve is normal in structure. Aortic valve regurgitation is not visualized. No aortic stenosis is present.  7. The inferior vena cava is dilated in size with <50% respiratory variability, suggesting right atrial pressure of 15 mmHg. FINDINGS  Left Ventricle: Left ventricular ejection fraction, by estimation, is 30 to 35%. The left ventricle has moderately decreased function. The left ventricle demonstrates global hypokinesis. Definity contrast agent was given IV to delineate the left ventricular endocardial  borders. The left ventricular internal cavity size was moderately dilated. There  is no left ventricular hypertrophy. Left ventricular diastolic parameters are consistent with Grade I diastolic dysfunction (impaired relaxation).  LV Wall Scoring: The basal inferolateral segment is akinetic. Right Ventricle: The right ventricular size is mildly enlarged. No increase in right ventricular wall thickness. Right ventricular systolic function is moderately reduced. There is moderately elevated pulmonary artery systolic pressure. The tricuspid regurgitant velocity is 2.88 m/s, and with an assumed right atrial pressure of 15 mmHg, the estimated right ventricular systolic pressure is 48.2 mmHg. Left Atrium: Left atrial size was severely dilated. Right Atrium: Right atrial size was mildly dilated. Pericardium: There is no evidence of pericardial effusion. Mitral Valve: The mitral valve is normal in structure. Normal mobility of the mitral valve leaflets. Mild mitral valve regurgitation. No evidence of mitral valve stenosis. Tricuspid Valve: The tricuspid valve is normal in structure. Tricuspid valve regurgitation is mild . No evidence of tricuspid stenosis. Aortic Valve: The aortic valve is normal in structure. Aortic valve regurgitation is not visualized. No aortic stenosis is present. Pulmonic Valve: The pulmonic valve was normal in structure. Pulmonic valve regurgitation is not visualized. No evidence of pulmonic stenosis. Aorta: The aortic root is normal in size and structure. Venous: The inferior vena cava is dilated in size with less than 50% respiratory variability, suggesting right atrial pressure of 15 mmHg. IAS/Shunts: No atrial level shunt detected by color flow Doppler. Additional Comments: A pacer wire is visualized.  LEFT VENTRICLE PLAX 2D LVIDd:         6.40 cm LVIDs:         6.30 cm LV PW:         0.60 cm LV IVS:        0.90 cm LVOT diam:     2.00 cm LV SV:         33 LV SV Index:   15 LVOT Area:     3.14 cm   RIGHT VENTRICLE RV S prime:     5.44 cm/s TAPSE (M-mode): 1.4 cm LEFT ATRIUM              Index       RIGHT ATRIUM           Index LA diam:        5.90 cm  2.68 cm/m  RA Area:     19.40 cm LA Vol (A2C):   112.0 ml 50.91 ml/m RA Volume:   53.50 ml  24.32 ml/m LA Vol (A4C):   129.0 ml 58.64 ml/m LA Biplane Vol: 121.0 ml 55.00 ml/m  AORTIC VALVE LVOT Vmax:   66.58 cm/s LVOT Vmean:  46.125 cm/s LVOT VTI:    0.104 m  AORTA Ao Root diam: 3.00 cm TRICUSPID VALVE TR Peak grad:   33.2 mmHg TR Vmax:        288.00 cm/s  SHUNTS Systemic VTI:  0.10 m Systemic Diam: 2.00 cm Donato Schultz MD Electronically signed by Donato Schultz MD Signature Date/Time: 09/09/2019/1:11:22 PM    Final     Cardiac Studies   none  Patient Profile     69 y.o. male admitted with symptomatic atrial fib and worsening CHF  Assessment & Plan    1. Persistent atrial fib - he has not missed any of his blood thinners and will undergo DCCV tomorrow.  2. VT - he has had no recurrent ICD therapies. 3. Chronic systolic heart failure - he has had a worsening prompting his admit but feels better this morning.    For questions  or updates, please contact [facility] Please consult [email or web address] for contact info under Cardiology/STEMI.   Signed, Lewayne Bunting, MD  09/10/2019, 9:53 AM

## 2019-09-10 NOTE — Progress Notes (Signed)
Progress Note  Patient Name: Max Rivas Date of Encounter: 09/10/2019  [facility] Cardiologist: Lesleigh Noe, MD   Subjective   Breathing is much better. Net diuresis at least 1.2L (probably higher, not fully recorded in ED). Wt 222.6 lb (substantially lower than last office visit 235 lb).  Inpatient Medications    Scheduled Meds:  amiodarone  200 mg Oral Daily   apixaban  5 mg Oral BID   aspirin  81 mg Oral Daily   atorvastatin  80 mg Oral Daily   budesonide  0.5 mg Inhalation BID   furosemide  40 mg Intravenous BID   gabapentin  200 mg Oral BID   levothyroxine  75 mcg Oral QAC breakfast   montelukast  10 mg Oral Daily   pantoprazole  40 mg Oral BID   sodium chloride flush  3 mL Intravenous Q12H   Continuous Infusions:  sodium chloride Stopped (09/09/19 2341)   ferumoxytol Stopped (09/09/19 2341)   PRN Meds: sodium chloride, acetaminophen, alum & mag hydroxide-simeth, levalbuterol, ondansetron (ZOFRAN) IV, sodium chloride flush   Vital Signs    Vitals:   09/09/19 2059 09/10/19 0018 09/10/19 0514 09/10/19 0812  BP:  121/75 101/74 116/75  Pulse:  94 86   Resp:  18 20 17   Temp:  97.6 F (36.4 C) (!) 97.5 F (36.4 C)   TempSrc:  Oral Oral   SpO2: 93% 92% 94%   Weight:   101 kg   Height:        Intake/Output Summary (Last 24 hours) at 09/10/2019 0905 Last data filed at 09/10/2019 0500 Gross per 24 hour  Intake 576 ml  Output 1300 ml  Net -724 ml   Last 3 Weights 09/10/2019 09/09/2019 09/08/2019  Weight (lbs) 222 lb 9.6 oz 220 lb 8 oz 221 lb  Weight (kg) 100.971 kg 100.018 kg 100.245 kg      Telemetry    AFib, rate controlled - Personally Reviewed  ECG    No new tracing - Personally Reviewed  Physical Exam  Obese GEN: No acute distress.   Neck: No JVD Cardiac: irregular, no murmurs, rubs, or gallops.  Respiratory: markedly diminished breath sounds, very few faint wheezes, but clear to auscultation bilaterally. GI: Soft,  nontender, non-distended  MS: No edema; No deformity. Neuro:  Nonfocal  Psych: Normal affect   Labs    High Sensitivity Troponin:   Recent Labs  Lab 08/28/19 0058 09/08/19 1822 09/08/19 2133  TROPONINIHS 15 12 13       Chemistry Recent Labs  Lab 09/08/19 1822 09/09/19 0618 09/10/19 0041  NA 138 138 137  K 3.7 3.1* 5.0  CL 99 99 98  CO2 28 27 27   GLUCOSE 125* 94 102*  BUN 17 15 16   CREATININE 1.61* 1.40* 1.50*  CALCIUM 9.1 8.6* 9.5  PROT 6.8  --   --   ALBUMIN 3.6  --   --   AST 25  --   --   ALT 27  --   --   ALKPHOS 76  --   --   BILITOT 1.1  --   --   GFRNONAA 44* 52* 47*  GFRAA 51* 60* 55*  ANIONGAP 11 12 12      Hematology Recent Labs  Lab 09/08/19 1822 09/10/19 0041  WBC 7.8 10.6*  RBC 4.75 4.77  HGB 13.8 13.8  HCT 44.1 44.0  MCV 92.8 92.2  MCH 29.1 28.9  MCHC 31.3 31.4  RDW 15.3 15.5  PLT 215 215    BNP Recent Labs  Lab 09/08/19 1822  BNP 455.0*     DDimer No results for input(s): DDIMER in the last 168 hours.   Radiology    DG Chest Port 1 View  Result Date: 09/08/2019 CLINICAL DATA:  Shortness of breath EXAM: PORTABLE CHEST 1 VIEW COMPARISON:  08/28/2019 FINDINGS: Left AICD remains in place, unchanged. Cardiomegaly, vascular congestion. There is hyperinflation of the lungs compatible with COPD. Probable underlying fibrosis within the lungs. Small right effusion and moderate left effusion. Mid and lower lung increased interstitial markings and airspace disease likely reflect superimposed edema. IMPRESSION: Cardiomegaly. COPD/chronic lung disease. Bilateral pleural effusions, enlarged since prior study, moderate on the left. Probable mild superimposed pulmonary edema. Electronically Signed   By: Charlett Nose M.D.   On: 09/08/2019 18:05   ECHOCARDIOGRAM COMPLETE  Result Date: 09/09/2019    ECHOCARDIOGRAM REPORT   Patient Name:   Max Rivas Date of Exam: 09/09/2019 Medical Rec #:  [id number]      Height:       71.0 in Accession #:     [id number]     Weight:       221.0 lb Date of Birth:  [birth year]      BSA:          2.200 m Patient Age:    69 years       BP:           107/74 mmHg Patient Gender: M              HR:           84 bpm. Exam Location:  Inpatient Procedure: 2D Echo, Color Doppler, Cardiac Doppler and Intracardiac            Opacification Agent Indications:    I48.91* Unspecified atrial fibrillation  History:        Patient has prior history of Echocardiogram examinations, most                 recent 05/23/2019. Pacemaker and Defibrillator, COPD,                 Arrythmias:Atrial Fibrillation; Risk Factors:Dyslipidemia and                 Sleep Apnea.  Sonographer:    Irving Burton Senior RDCS Referring Phys: [phone] [facility]  Sonographer Comments: Technically difficult, patient scanned sitting up due to difficulty breathing. IMPRESSIONS  1. Left ventricular ejection fraction, by estimation, is 30 to 35%. The left ventricle has moderately decreased function. The left ventricle demonstrates global hypokinesis. The left ventricular internal cavity size was moderately dilated. Left ventricular diastolic parameters are consistent with Grade I diastolic dysfunction (impaired relaxation).  2. Right ventricular systolic function is moderately reduced. The right ventricular size is mildly enlarged. There is moderately elevated pulmonary artery systolic pressure.  3. Left atrial size was severely dilated.  4. Right atrial size was mildly dilated.  5. The mitral valve is normal in structure. Mild mitral valve regurgitation. No evidence of mitral stenosis.  6. The aortic valve is normal in structure. Aortic valve regurgitation is not visualized. No aortic stenosis is present.  7. The inferior vena cava is dilated in size with <50% respiratory variability, suggesting right atrial pressure of 15 mmHg. FINDINGS  Left Ventricle: Left ventricular ejection fraction, by estimation, is 30 to 35%. The left ventricle has moderately decreased function.  The left ventricle demonstrates global hypokinesis. Definity contrast agent  was given IV to delineate the left ventricular endocardial borders. The left ventricular internal cavity size was moderately dilated. There is no left ventricular hypertrophy. Left ventricular diastolic parameters are consistent with Grade I diastolic dysfunction (impaired relaxation).  LV Wall Scoring: The basal inferolateral segment is akinetic. Right Ventricle: The right ventricular size is mildly enlarged. No increase in right ventricular wall thickness. Right ventricular systolic function is moderately reduced. There is moderately elevated pulmonary artery systolic pressure. The tricuspid regurgitant velocity is 2.88 m/s, and with an assumed right atrial pressure of 15 mmHg, the estimated right ventricular systolic pressure is 48.2 mmHg. Left Atrium: Left atrial size was severely dilated. Right Atrium: Right atrial size was mildly dilated. Pericardium: There is no evidence of pericardial effusion. Mitral Valve: The mitral valve is normal in structure. Normal mobility of the mitral valve leaflets. Mild mitral valve regurgitation. No evidence of mitral valve stenosis. Tricuspid Valve: The tricuspid valve is normal in structure. Tricuspid valve regurgitation is mild . No evidence of tricuspid stenosis. Aortic Valve: The aortic valve is normal in structure. Aortic valve regurgitation is not visualized. No aortic stenosis is present. Pulmonic Valve: The pulmonic valve was normal in structure. Pulmonic valve regurgitation is not visualized. No evidence of pulmonic stenosis. Aorta: The aortic root is normal in size and structure. Venous: The inferior vena cava is dilated in size with less than 50% respiratory variability, suggesting right atrial pressure of 15 mmHg. IAS/Shunts: No atrial level shunt detected by color flow Doppler. Additional Comments: A pacer wire is visualized.  LEFT VENTRICLE PLAX 2D LVIDd:         6.40 cm LVIDs:          6.30 cm LV PW:         0.60 cm LV IVS:        0.90 cm LVOT diam:     2.00 cm LV SV:         33 LV SV Index:   15 LVOT Area:     3.14 cm  RIGHT VENTRICLE RV S prime:     5.44 cm/s TAPSE (M-mode): 1.4 cm LEFT ATRIUM              Index       RIGHT ATRIUM           Index LA diam:        5.90 cm  2.68 cm/m  RA Area:     19.40 cm LA Vol (A2C):   112.0 ml 50.91 ml/m RA Volume:   53.50 ml  24.32 ml/m LA Vol (A4C):   129.0 ml 58.64 ml/m LA Biplane Vol: 121.0 ml 55.00 ml/m  AORTIC VALVE LVOT Vmax:   66.58 cm/s LVOT Vmean:  46.125 cm/s LVOT VTI:    0.104 m  AORTA Ao Root diam: 3.00 cm TRICUSPID VALVE TR Peak grad:   33.2 mmHg TR Vmax:        288.00 cm/s  SHUNTS Systemic VTI:  0.10 m Systemic Diam: 2.00 cm Donato Schultz MD Electronically signed by Donato Schultz MD Signature Date/Time: 09/09/2019/1:11:22 PM    Final     Cardiac Studies   As above  Patient Profile     69 y.o. male with a history of CAD status post three-vessel bypass in May 2021, heart failure with midrange ejection fraction (40-45% May 2021), ischemic cardiomyopathy, ventricular tachycardia status post single lead AICD (last upgrade 2013), persistent atrial fibrillation on Eliquis and amiodarone, remote tobacco abuse, COPD on home O2, morbid  obesity, and obstructive sleep apnea presenting with acute HF exacerbation and atrial fibrillation with RVR.  Assessment & Plan    1.  Acute on chronic heart failure with midrange ejection fraction/ischemic cardiomyopathy: (EF of 40-45% by echo in May 2021), who presented to the emergency department yesterday after an episode of presyncope and hypoxia.  He has been experiencing worsening fatigue, dyspnea, malaise and more recent lower extremity swelling since mid July following an episode of VT requiring ICD shock.  He has also been in atrial fibrillation since that time.  Appears euvolemic today. Will switch to PO diuretics.  2. Syncope: likely due to severe hypoxia (O2 sat 60% or lower). No new ventricular  arrhythmia recorded on yesterday device check.  3.  Persistent atrial fibrillation with rapid ventricular response:  Amiodarone dose was increased to 200 mg twice daily at clinic visit on August 20 and compliant with Eliquis.  For DCCV tomorrow. This procedure has been fully reviewed with the patient and written informed consent has been obtained.  4.  History of ventricular tachycardia:  Continue amiodarone and mexiletine.  Plans for VT ablation at some point in September per Dr. Ladona Ridgel.    5. ICD: normal device function by check yesterday. No VT since previous check. Does not require pacing. Optivol invalid due to recent CABG. His device is approaching elective replacement indicators and patient plans to have that changed out at [facility].  6.  Coronary artery disease: Status post three-vessel bypass in May.  Despite rapid rates, heart failure, and hypoxia, denies angina and troponins are normal.  Continue aspirin and statin therapy.  7.  Acute on chronic hypoxic respiratory failure/COPD: Complicated by volume excess. Was also wheezing severely, but greatly improved.  6.  Hyperlipidemia: LDL of 70 in May, on statin  8.  Acute kidney injury: Creatinine 1.61 on arrival yesterday evening, which was up from 1.27 on August 16.  Initially improved with diuresis, now inching back up. Switch to PO diuretics.     For questions or updates, please contact [facility] Please consult [email or web address] for contact info under        Signed, Thurmon Fair, MD  09/10/2019, 9:05 AM

## 2019-09-11 ENCOUNTER — Encounter (HOSPITAL_COMMUNITY)
Admission: EM | Disposition: A | Discharge status: Home / Self Care | Payer: Self-pay | Source: Home / Self Care | Attending: Student in an Organized Health Care Education/Training Program

## 2019-09-11 ENCOUNTER — Inpatient Hospital Stay (HOSPITAL_COMMUNITY): Payer: No Typology Code available for payment source | Admitting: Anesthesiology

## 2019-09-11 ENCOUNTER — Encounter (HOSPITAL_COMMUNITY): Payer: Self-pay | Admitting: Student in an Organized Health Care Education/Training Program

## 2019-09-11 DIAGNOSIS — N1831 Chronic kidney disease, stage 3a: Secondary | ICD-10-CM

## 2019-09-11 DIAGNOSIS — J9601 Acute respiratory failure with hypoxia: Secondary | ICD-10-CM

## 2019-09-11 DIAGNOSIS — I130 Hypertensive heart and chronic kidney disease with heart failure and stage 1 through stage 4 chronic kidney disease, or unspecified chronic kidney disease: Principal | ICD-10-CM

## 2019-09-11 HISTORY — PX: CARDIOVERSION: SHX1299

## 2019-09-11 LAB — BASIC METABOLIC PANEL
Anion gap: 11 (ref 5–15)
BUN: 12 mg/dL (ref 8–23)
CO2: 29 mmol/L (ref 22–32)
Calcium: 9.1 mg/dL (ref 8.9–10.3)
Chloride: 97 mmol/L — ABNORMAL LOW (ref 98–111)
Creatinine, Ser: 1.35 mg/dL — ABNORMAL HIGH (ref 0.61–1.24)
GFR calc Af Amer: >60 mL/min (ref >60)
GFR calc non Af Amer: 54 mL/min — ABNORMAL LOW (ref >60)
Glucose, Bld: 98 mg/dL (ref 70–99)
Potassium: 4.1 mmol/L (ref 3.5–5.1)
Sodium: 137 mmol/L (ref 135–145)

## 2019-09-11 LAB — CBC
HCT: 42.4 % (ref 39.0–52.0)
Hemoglobin: 13.3 g/dL (ref 13.0–17.0)
MCH: 29 pg (ref 26.0–34.0)
MCHC: 31.4 g/dL (ref 30.0–36.0)
MCV: 92.6 fL (ref 80.0–100.0)
Platelets: 203 10*3/uL (ref 150–400)
RBC: 4.58 MIL/uL (ref 4.22–5.81)
RDW: 15.3 % (ref 11.5–15.5)
WBC: 7.9 10*3/uL (ref 4.0–10.5)
nRBC: 0 % (ref 0.0–0.2)

## 2019-09-11 LAB — GLUCOSE, CAPILLARY: Glucose-Capillary: 113 mg/dL — ABNORMAL HIGH (ref 70–99)

## 2019-09-11 SURGERY — CARDIOVERSION
Anesthesia: General

## 2019-09-11 MED ORDER — PROPOFOL 10 MG/ML IV BOLUS
INTRAVENOUS | Status: DC | PRN
  Administered 2019-09-11 (×2): 20 mg via INTRAVENOUS
  Administered 2019-09-11: 60 mg via INTRAVENOUS

## 2019-09-11 MED ORDER — SODIUM CHLORIDE 0.9 % IV SOLN
INTRAVENOUS | Status: AC | PRN
  Administered 2019-09-11: 500 mL via INTRAVENOUS

## 2019-09-11 MED ORDER — LIDOCAINE 2% (20 MG/ML) 5 ML SYRINGE
INTRAMUSCULAR | Status: DC | PRN
  Administered 2019-09-11: 40 mg via INTRAVENOUS

## 2019-09-11 NOTE — CV Procedure (Signed)
Procedure:   DCCV  Indication:  Symptomatic atrial fibrillation  Procedure Note:  The patient signed informed consent.  They have had had therapeutic anticoagulation with apixaban greater than 3 weeks.  Anesthesia was administered by Dr. Armond Hang.  Patient received 100 mg IV propofol. Adequate airway was maintained throughout and vitals followed per protocol. He did desaturate to the upper 70s/low 80s O2 percent early in anesthesia, but once he relaxed he oxygenated better. They were cardioverted x 3 with 150, 200, 200J of biphasic synchronized energy with pressure applied over the pads with a blanket. The [employer] representative was available throughout the procedure. He remained in atrial fibrillation immediately after cardioversion.  However, while in PACU his rate slowed to the 80s, and an ECG showed NSR with occasional PVCs.  There were no apparent complications.  The patient had normal neuro status and respiratory status post procedure with vitals stable as recorded elsewhere.    Follow up:  They will continue on current medical therapy and follow up with cardiology as scheduled.  Jodelle Red, MD PhD 09/11/2019 8:48 AM

## 2019-09-11 NOTE — Discharge Instructions (Signed)
Heart Failure, Self Care Heart failure is a serious condition. This sheet explains things you need to do to take care of yourself at home. To help you stay as healthy as possible, you may be asked to change your diet, take certain medicines, and make other changes in your life. Your doctor may also give you more specific instructions. If you have problems or questions, call your doctor. What are the risks? Having heart failure makes it more likely for you to have some problems. These problems can get worse if you do not take good care of yourself. Problems may include:  Blood clotting problems. This may cause a stroke.  Damage to the kidneys, liver, or lungs.  Abnormal heart rhythms. Supplies needed:  Scale for weighing yourself.  Blood pressure monitor.  Notebook.  Medicines. How to care for yourself when you have heart failure Medicines Take over-the-counter and prescription medicines only as told by your doctor. Take your medicines every day.  Do not stop taking your medicine unless your doctor tells you to do so.  Do not skip any medicines.  Get your prescriptions refilled before you run out of medicine. This is important. Eating and drinking   Eat heart-healthy foods. Talk with a diet specialist (dietitian) to create an eating plan.  Choose foods that: ? Have no trans fat. ? Are low in saturated fat and cholesterol.  Choose healthy foods, such as: ? Fresh or frozen fruits and vegetables. ? Fish. ? Low-fat (lean) meats. ? Legumes, such as beans, peas, and lentils. ? Fat-free or low-fat dairy products. ? Whole-grain foods. ? High-fiber foods.  Limit salt (sodium) if told by your doctor. Ask your diet specialist to tell you which seasonings are healthy for your heart.  Cook in healthy ways instead of frying. Healthy ways of cooking include roasting, grilling, broiling, baking, poaching, steaming, and stir-frying.  Limit how much fluid you drink, if told by your  doctor. Alcohol use  Do not drink alcohol if: ? Your doctor tells you not to drink. ? Your heart was damaged by alcohol, or you have very bad heart failure. ? You are pregnant, may be pregnant, or are planning to become pregnant.  If you drink alcohol: ? Limit how much you use to:  0-1 drink a day for women.  0-2 drinks a day for men. ? Be aware of how much alcohol is in your drink. In the [place] one drink equals one 12 oz bottle of beer (355 mL), one 5 oz glass of wine (148 mL), or one 1 oz glass of hard liquor (44 mL). Lifestyle   Do not use any products that contain nicotine or tobacco, such as cigarettes, e-cigarettes, and chewing tobacco. If you need help quitting, ask your doctor. ? Do not use nicotine gum or patches before talking to your doctor.  Do not use illegal drugs.  Lose weight if told by your doctor.  Do physical activity if told by your doctor. Talk to your doctor before you begin an exercise if: ? You are an older adult. ? You have very bad heart failure.  Learn to manage stress. If you need help, ask your doctor.  Get rehab (rehabilitation) to help you stay independent and to help with your quality of life.  Plan time to rest when you get tired. Check weight and blood pressure   Weigh yourself every day. This will help you to know if fluid is building up in your body. ? Weigh yourself every morning   after you pee (urinate) and before you eat breakfast. ? Wear the same amount of clothing each time. ? Write down your daily weight. Give your record to your doctor.  Check and write down your blood pressure as told by your doctor.  Check your pulse as told by your doctor. Dealing with very hot and very cold weather  If it is very hot: ? Avoid activities that take a lot of energy. ? Use air conditioning or fans, or find a cooler place. ? Avoid caffeine and alcohol. ? Wear clothing that is loose-fitting, lightweight, and light-colored.  If it is very  cold: ? Avoid activities that take a lot of energy. ? Layer your clothes. ? Wear mittens or gloves, a hat, and a scarf when you go outside. ? Avoid alcohol. Follow these instructions at home:  Stay up to date with shots (vaccines). Get pneumococcal and flu (influenza) shots.  Keep all follow-up visits as told by your doctor. This is important. Contact a doctor if:  You gain weight quickly.  You have increasing shortness of breath.  You cannot do your normal activities.  You get tired easily.  You cough a lot.  You don't feel like eating or feel like you may vomit (nauseous).  You become puffy (swell) in your hands, feet, ankles, or belly (abdomen).  You cannot sleep well because it is hard to breathe.  You feel like your heart is beating fast (palpitations).  You get dizzy when you stand up. Get help right away if:  You have trouble breathing.  You or someone else notices a change in your behavior, such as having trouble staying awake.  You have chest pain or discomfort.  You pass out (faint). These symptoms may be an emergency. Do not wait to see if the symptoms will go away. Get medical help right away. Call your local emergency services (911 in the [place] Do not drive yourself to the hospital. Summary  Heart failure is a serious condition. To care for yourself, you may have to change your diet, take medicines, and make other lifestyle changes.  Take your medicines every day. Do not stop taking them unless your doctor tells you to do so.  Eat heart-healthy foods, such as fresh or frozen fruits and vegetables, fish, lean meats, legumes, fat-free or low-fat dairy products, and whole-grain or high-fiber foods.  Ask your doctor if you can drink alcohol. You may have to stop alcohol use if you have very bad heart failure.  Contact your doctor if you gain weight quickly or feel that your heart is beating too fast. Get help right away if you pass out, or have chest pain  or trouble breathing. This information is not intended to replace advice given to you by your health care provider. Make sure you discuss any questions you have with your health care provider. Document Revised: 04/12/2018 Document Reviewed: 04/13/2018 Elsevier Patient Education  2020 [employer]  

## 2019-09-11 NOTE — Interval H&P Note (Signed)
History and Physical Interval Note:  09/11/2019 8:06 AM  Max Rivas  has presented today for surgery, with the diagnosis of afib.  The various methods of treatment have been discussed with the patient and family. After consideration of risks, benefits and other options for treatment, the patient has consented to  Procedure(s): CARDIOVERSION (N/A) as a surgical intervention.  The patient's history has been reviewed, patient examined, no change in status, stable for surgery.  I have reviewed the patient's chart and labs.  Questions were answered to the patient's satisfaction.     Max Rivas Cristal Deer

## 2019-09-11 NOTE — Transfer of Care (Signed)
Immediate Anesthesia Transfer of Care Note  Patient: Max Rivas  Procedure(s) Performed: CARDIOVERSION (N/A )  Patient Location: Endoscopy Unit  Anesthesia Type:General  Level of Consciousness: awake, oriented and patient cooperative  Airway & Oxygen Therapy: Patient Spontanous Breathing and Patient connected to nasal cannula oxygen  Post-op Assessment: Report given to RN and Post -op Vital signs reviewed and stable  Post vital signs: Reviewed  Last Vitals:  Vitals Value Taken Time  BP 121/64 09/11/19 0852  Temp    Pulse 86 09/11/19 0852  Resp 25 09/11/19 0852  SpO2 92 % 09/11/19 0852  Vitals shown include unvalidated device data.  Last Pain:  Vitals:   09/11/19 0752  TempSrc: Oral  PainSc: 0-No pain         Complications: No complications documented.

## 2019-09-11 NOTE — Progress Notes (Signed)
Spoke with Max Rivas regarding his dispo. He mentions that his home oxygen is locked in his house and expresses concern as he doesn't have the key to his home at the moment as he had planned to stay at his girlfriend's tonight. He mentions his neighbor can open the door at [facility] but he wanted to be discharged earlier. Advised that he stay in the hospital until 7pm tonight but he states he will ask his girlfriend to pick up his home key from the neighbor instead and bring him the oxygen prior to discharge. All other questions and concerns addressed.

## 2019-09-11 NOTE — Progress Notes (Signed)
Patient refused BIPAP for tonight. Patient aware to call for Respiratory if he changes his mind.

## 2019-09-11 NOTE — Progress Notes (Signed)
All discharge information including medication , follow-up care and prescription meds provided to patient. Iv removed and all belongings returned to patient. No further question ask. Patient waiting for discharge.

## 2019-09-11 NOTE — Progress Notes (Addendum)
Patient ID: Max Rivas, male   DOB: March 19, 1950, 69 y.o.   MRN: [id number]   Progress Note  Patient Name: Max Rivas Date of Encounter: 09/11/2019  Primary Cardiologist: Lesleigh Noe, MD   Subjective   NOt quite to his baseline resp status, " I am still wheezing", no CP, no overt SOB at rest  Inpatient Medications    Scheduled Meds:  [MAR Hold] amiodarone  200 mg Oral Daily   [MAR Hold] apixaban  5 mg Oral BID   [MAR Hold] aspirin  81 mg Oral Daily   [MAR Hold] atorvastatin  80 mg Oral Daily   [MAR Hold] budesonide  0.5 mg Inhalation BID   [MAR Hold] furosemide  80 mg Oral Daily   [MAR Hold] gabapentin  200 mg Oral BID   [MAR Hold] levothyroxine  75 mcg Oral QAC breakfast   [MAR Hold] montelukast  10 mg Oral Daily   [MAR Hold] pantoprazole  40 mg Oral BID   [MAR Hold] sodium chloride flush  3 mL Intravenous Q12H   Continuous Infusions:  [MAR Hold] sodium chloride Stopped (09/09/19 2341)   sodium chloride 500 mL (09/11/19 0754)   [MAR Hold] ferumoxytol Stopped (09/09/19 2341)   PRN Meds: [YYT Hold] sodium chloride, sodium chloride, [MAR Hold] acetaminophen, [MAR Hold] alum & mag hydroxide-simeth, [MAR Hold] ipratropium-albuterol, [MAR Hold] levalbuterol, [MAR Hold] ondansetron (ZOFRAN) IV, [MAR Hold] sodium chloride flush   Vital Signs    Vitals:   09/10/19 2054 09/10/19 2059 09/11/19 0429 09/11/19 0752  BP:   102/66 (!) 146/81  Pulse:   66 99  Resp:   18 (!) 26  Temp:   (!) 97.5 F (36.4 C)   TempSrc:   Oral Oral  SpO2: (!) 89% 92% 98% 94%  Weight:   100.9 kg   Height:        Intake/Output Summary (Last 24 hours) at 09/11/2019 0758 Last data filed at 09/11/2019 0434 Gross per 24 hour  Intake 620 ml  Output 600 ml  Net 20 ml   Filed Weights   09/09/19 1508 09/10/19 0514 09/11/19 0429  Weight: 100 kg 101 kg 100.9 kg    Telemetry    Atrial fib 90's - Personally Reviewed  ECG    No new EKGs - Personally Reviewed  Physical Exam   GEN: No  acute distress.   Neck: No JVD Cardiac: irreg-irreg, no murmurs, rubs, or gallops.  Respiratory: exp wheezes bilaterally. GI: Soft, nontender, non-distended  MS: trace-1+ edema; No deformity. Neuro:  Nonfocal  Psych: Normal affect   Labs    Chemistry Recent Labs  Lab 09/08/19 1822 09/08/19 1822 09/09/19 0618 09/10/19 0041 09/11/19 0248  NA 138   < > 138 137 137  K 3.7   < > 3.1* 5.0 4.1  CL 99   < > 99 98 97*  CO2 28   < > 27 27 29   GLUCOSE 125*   < > 94 102* 98  BUN 17   < > 15 16 12   CREATININE 1.61*   < > 1.40* 1.50* 1.35*  CALCIUM 9.1   < > 8.6* 9.5 9.1  PROT 6.8  --   --   --   --   ALBUMIN 3.6  --   --   --   --   AST 25  --   --   --   --   ALT 27  --   --   --   --  ALKPHOS 76  --   --   --   --   BILITOT 1.1  --   --   --   --   GFRNONAA 44*   < > 52* 47* 54*  GFRAA 51*   < > 60* 55* >60  ANIONGAP 11   < > 12 12 11    < > = values in this interval not displayed.     Hematology Recent Labs  Lab 09/08/19 1822 09/10/19 0041 09/11/19 0248  WBC 7.8 10.6* 7.9  RBC 4.75 4.77 4.58  HGB 13.8 13.8 13.3  HCT 44.1 44.0 42.4  MCV 92.8 92.2 92.6  MCH 29.1 28.9 29.0  MCHC 31.3 31.4 31.4  RDW 15.3 15.5 15.3  PLT 215 215 203    Cardiac EnzymesNo results for input(s): TROPONINI in the last 168 hours. No results for input(s): TROPIPOC in the last 168 hours.   BNP Recent Labs  Lab 09/08/19 1822  BNP 455.0*     DDimer No results for input(s): DDIMER in the last 168 hours.   Radiology     Cardiac Studies   09/09/2019 : TTE IMPRESSIONS  1. Left ventricular ejection fraction, by estimation, is 30 to 35%. The  left ventricle has moderately decreased function. The left ventricle  demonstrates global hypokinesis. The left ventricular internal cavity size  was moderately dilated. Left  ventricular diastolic parameters are consistent with Grade I diastolic  dysfunction (impaired relaxation).   2. Right ventricular systolic function is moderately reduced. The  right  ventricular size is mildly enlarged. There is moderately elevated  pulmonary artery systolic pressure.   3. Left atrial size was severely dilated.   4. Right atrial size was mildly dilated.   5. The mitral valve is normal in structure. Mild mitral valve  regurgitation. No evidence of mitral stenosis.   6. The aortic valve is normal in structure. Aortic valve regurgitation is  not visualized. No aortic stenosis is present.   7. The inferior vena cava is dilated in size with <50% respiratory  variability, suggesting right atrial pressure of 15 mmHg.   Patient Profile     69 y.o. male with a history of CAD status post three-vessel bypass in May 2021, heart failure with midrange ejection fraction (40-45% May 2021), ischemic cardiomyopathy, ventricular tachycardia status post single lead AICD (last upgrade 2013), persistent atrial fibrillation on Eliquis and amiodarone, remote tobacco abuse, COPD on home O2, morbid obesity, and obstructive sleep apnea wasadmitted with symptomatic atrial fib and worsening CHF and SOB  Device information MDT single chamber ICD, initial implant 2005 has had subesequent gen changes, last 2013 with ne RV lead as well He has a capped/abandone old RV lead in place Follows at [facility]  Assessment & Plan    1. Persistent atrial fib      CHA2DS2Vasc is 3, on Eliquis, appropriately dosed     Pt confirms no missed dose of his Eliquis at home >3 weeks, has been getting here     LAA clipping at time of his CABG     Had been planned for DCCV out pt via his [facility] doctors     DCCV this AM   2. VT     he has had no recurrent ICD therapies     On amiodarone     Schedule via the [place] for VT ablation in Oct   3. Chronic systolic heart failure  4. Chronic hypoxic respiratory failure/COPD, on home O2     he has  had a worsening prompting his admit      Not yet at his baseline this AM, still wheezing     Diuresed cumulatively neg -1204 by I/O, no noted change in weight/up  some (the patient reports more brisk urine output)     Changed to PO diuretics yesterday     SR should help     Creat trend down again     COPD management with medicine team   ADDEND Dr. Ladona Ridgel has seen and examined the patient, and OK to discharge from EP perspective The patient reports feeling better, and that since DCCV this AM he has filled up 2 urinals (2L) He has [place] cardiology follow up in place he thinks is either the 15th or 16th, (this would be OK) but reports he can see them sooner anytime too if needed.      For questions or updates, please contact [facility] Please consult [email or web address] for contact info under Cardiology/STEMI.   Signed, Sheilah Pigeon, PA-C  09/11/2019, 7:58 AM    EP Attending  Patient seen and examined. Agree with above. The patient has maintained NSR and he feels better. He may be discharged home with followup as noted above.   Dorathy Daft.

## 2019-09-11 NOTE — Discharge Summary (Signed)
Name: Max Rivas MRN: [id number] DOB: 12-24-1950 69 y.o. PCP: Anson Fret, MD  Date of Admission: 09/08/2019  5:16 PM Date of Discharge:  09/11/2019 Attending Physician: Earl Lagos, MD  Discharge Diagnosis: 1. Acute hypoxic respiratory failure 2/2 acute on chronic systolic heart failure 2. Atrial Fibrillation s/p ICD 3. AKI on CKD IIIa 2/2 hypervolemia  Discharge Medications: Allergies as of 09/11/2019      Reactions   Crestor [rosuvastatin Calcium] Other (See Comments)   Back spasms, but can tolerate it at a low dose now      Medication List    STOP taking these medications   midodrine 5 MG tablet Commonly known as: PROAMATINE   traMADol 50 MG tablet Commonly known as: ULTRAM     TAKE these medications   acetaminophen 325 MG tablet Commonly known as: TYLENOL Take 2 tablets (650 mg total) by mouth every 6 (six) hours as needed for mild pain (or Fever >/= 101).   amiodarone 200 MG tablet Commonly known as: PACERONE Take 1 tablet (200 mg total) by mouth daily. What changed: additional instructions   aspirin 81 MG chewable tablet Chew 1 tablet (81 mg total) by mouth daily.   atorvastatin 80 MG tablet Commonly known as: LIPITOR Take 1 tablet (80 mg total) by mouth daily.   carboxymethylcellulose 0.5 % Soln Commonly known as: REFRESH PLUS Place 1 drop into both eyes at bedtime.   dextromethorphan-guaiFENesin 30-600 MG 12hr tablet Commonly known as: MUCINEX DM Take 1 tablet by mouth 2 (two) times daily as needed for cough.   Eliquis 5 MG Tabs tablet Generic drug: apixaban Take 5 mg by mouth 2 (two) times daily.   furosemide 40 MG tablet Commonly known as: LASIX Take 40-80 mg by mouth 2 (two) times daily. Take 2 tablets (80 mg) in the morning and Take 1 tablet (40 mg) at bedtime   gabapentin 100 MG capsule Commonly known as: NEURONTIN Take 2 capsules (200 mg total) by mouth 2 (two) times daily.   levothyroxine 75 MCG tablet Commonly known  as: SYNTHROID Take 75 mcg by mouth daily before breakfast.   metoprolol succinate 25 MG 24 hr tablet Commonly known as: TOPROL-XL Take 12.5 mg by mouth daily.   mexiletine 150 MG capsule Commonly known as: MEXITIL Take 150 mg by mouth 2 (two) times daily.   mometasone 220 MCG/INH inhaler Commonly known as: ASMANEX Inhale 2 puffs into the lungs 2 (two) times daily.   montelukast 10 MG tablet Commonly known as: SINGULAIR Take 10 mg by mouth daily.   multivitamin with minerals Tabs tablet Take 1 tablet by mouth daily.   nitroGLYCERIN 0.4 MG SL tablet Commonly known as: NITROSTAT Place 0.4 mg under the tongue every 5 (five) minutes as needed for chest pain.   omega-3 acid ethyl esters 1 g capsule Commonly known as: LOVAZA Take 1 capsule (1 g total) by mouth 2 (two) times daily.   omeprazole 20 MG capsule Commonly known as: PRILOSEC Take 20 mg by mouth 2 (two) times daily before a meal.   Stiolto Respimat 2.5-2.5 MCG/ACT Aers Generic drug: Tiotropium Bromide-Olodaterol Inhale 2 puffs into the lungs daily.   Systane Balance 0.6 % Soln Generic drug: Propylene Glycol Place 1 drop into both eyes See admin instructions. Place 1 drop into each eye four times a day and apply a warm compress for 10 minutes afterwards   Vitamin D3 50 MCG (2000 UT) Tabs Take 2,000 Units by mouth daily.   Xopenex HFA 45  MCG/ACT inhaler Generic drug: levalbuterol Inhale 2 puffs into the lungs every 6 (six) hours as needed for wheezing or shortness of breath.       Disposition and follow-up:   Mr.Max Rivas was discharged from [facility] in Stable condition.  At the hospital follow up visit please address:  1.  Patient had acute heart failure exacerbation 2/2 atrial fibrillation. DCCV performed, normal sinus rhythm upon discharge. Will follow-up with cardiology, plan for ablation in October. Kidney function back to baseline.  2.  Labs / imaging needed at time of follow-up:  CBC, CMP  3.  Pending labs/ test needing follow-up: n/a  Follow-up Appointments:  Follow-up Information     Anson Fret, MD. Schedule an appointment as soon as possible for a visit in 1 week(s).   Specialty: Family Medicine Contact information: [address] [place] [address] [phone]         Marinus Maw, MD .   Specialty: Cardiology Contact information: [phone] [address] [place] [address] [phone]         Lyn Records, MD .   Specialty: Cardiology Contact information: [phone] [address] [place] [address] [phone]                 Hospital Course by problem list: 1. Acute hypoxic respiratory failure 2/2 acute on chronic systolic heart failure: Patient presented with DOE, increased O2 requirement at home. TTE during admission without changes from previous study. Volume status improved with IV lasix, DCCV performed while inpatient. Patient back to baseline at discharge.  2. Atrial Fibrillation s/p ICD: Patient with CP, multiple shocks from ICD on arrival. CHADsVASC 5, on home eliquis. Afib present during admission until DCCV on 8/30, normal sinus rhythm at discharge. Plan to follow-up with Dr. Ladona Ridgel, planned ablation in October.  3. AKI on CKD IIIa 2/2 hypervolemia: On arrival, sCr 1.61 (baseline 1.2-1.3), hypervolemic on exam. Kidney function improved with IV lasix. Will follow-up with PCP.  Discharge Vitals:   BP 119/67 (BP Location: Left Arm)   Pulse 88   Temp 97.7 F (36.5 C) (Oral)   Resp 16   Ht 5\' 11"  (1.803 m)   Wt 100.9 kg   SpO2 90%   BMI 31.02 kg/m   Pertinent Labs, Studies, and Procedures:  CBC Latest Ref Rng & Units 09/11/2019 09/10/2019 09/08/2019  WBC 4.0 - 10.5 K/uL 7.9 10.6(H) 7.8  Hemoglobin 13.0 - 17.0 g/dL 09/10/2019 94.7 09.6  Hematocrit 39 - 52 % 42.4 44.0 44.1  Platelets 150 - 400 K/uL 203 215 215   BMP Latest Ref Rng & Units 09/11/2019 09/10/2019  09/09/2019  Glucose 70 - 99 mg/dL 98 09/11/2019) 94  BUN 8 - 23 mg/dL 12 16 15   Creatinine 0.61 - 1.24 mg/dL 662(H) ) 4.76(L)  BUN/Creat Ratio 10 - 24 - - -  Sodium 135 - 145 mmol/L 137 137 138  Potassium 3.5 - 5.1 mmol/L 4.1 5.0 3.1(L)  Chloride 98 - 111 mmol/L 97(L) 98 99  CO2 22 - 32 mmol/L 29 27 27   Calcium 8.9 - 10.3 mg/dL 9.1 9.5 4.65(K)   3.54(S ( ): 6.2  TTE (09/09/19):  1. Left ventricular ejection fraction, by estimation, is 30 to 35%. The  left ventricle has moderately decreased function. The left ventricle  demonstrates global hypokinesis. The left ventricular internal cavity size  was moderately dilated. Left  ventricular diastolic parameters are consistent with Grade I diastolic  dysfunction (impaired  relaxation).   2. Right ventricular systolic function is moderately reduced. The right  ventricular size is mildly enlarged. There is moderately elevated  pulmonary artery systolic pressure.   3. Left atrial size was severely dilated.   4. Right atrial size was mildly dilated.   5. The mitral valve is normal in structure. Mild mitral valve  regurgitation. No evidence of mitral stenosis.   6. The aortic valve is normal in structure. Aortic valve regurgitation is  not visualized. No aortic stenosis is present.   7. The inferior vena cava is dilated in size with <50% respiratory  variability, suggesting right atrial pressure of 15 mmHg.   DCCV 09/11/2019  Discharge Instructions:     Discharge Instructions      Mr. Halpin, I am glad you are feeling better and are ready for discharge! You were originally admitted for shortness of breath due to acute exacerbation of heart failure. We believe the heart failure exacerbation was caused by AFib. Thankfully, we were able to take fluid off through diuretics and perform cardioversion to get your heart into a normal sinus rhythm. You are scheduled to have an ablation in October with Dr. Ladona Ridgel. It will be important for you to  follow-up closely with your primary care doctor and cardiologist to help prevent any further exacerbations.   -There is no need for any change in medication at this time. Please make sure to attend your follow-up appointments for any further medication adjustments.  -If you have any further shortness of breath, chest pain, or leg swelling please make sure to call the doctor as soon as possible.   -It was a pleasure meeting you, Mr. Campanile. I wish you the best and hope you stay happy and healthy!  Thank you, Evlyn Kanner, MD     Signed: Evlyn Kanner, MD 09/11/2019, 10:42 AM   Pager: [phone]

## 2019-09-11 NOTE — Anesthesia Procedure Notes (Signed)
Procedure Name: General with mask airway Date/Time: 09/11/2019 8:35 AM Performed by: Lovie Chol, CRNA Pre-anesthesia Checklist: Patient identified, Emergency Drugs available, Suction available and Patient being monitored Patient Re-evaluated:Patient Re-evaluated prior to induction Oxygen Delivery Method: Ambu bag Preoxygenation: Pre-oxygenation with 100% oxygen Induction Type: IV induction

## 2019-09-11 NOTE — Anesthesia Preprocedure Evaluation (Addendum)
Anesthesia Evaluation  Patient identified by MRN, date of birth, ID band Patient awake    Reviewed: Allergy & Precautions, NPO status , Patient's Chart, lab work & pertinent test results, reviewed documented beta blocker date and time   Airway Mallampati: III  TM Distance: >3 FB Neck ROM: Full    Dental  (+) Poor Dentition, Dental Advisory Given   Pulmonary sleep apnea (does not use CPAP) , COPD (2L oxygen with activity),  COPD inhaler and oxygen dependent, former smoker,     + decreased breath sounds      Cardiovascular + CAD, + Past MI, + Cardiac Stents, + CABG (05/2019) and +CHF  + dysrhythmias Atrial Fibrillation and Ventricular Tachycardia + Cardiac Defibrillator (for VT)  Rhythm:Irregular Rate:Normal  TTE 2021 1. Left ventricular ejection fraction, by estimation, is 30 to 35%. The left ventricle has moderately decreased function. The left ventricle demonstrates global hypokinesis. The left ventricular internal cavity size was moderately dilated. Left ventricular diastolic parameters are consistent with Grade I diastolic dysfunction (impaired relaxation).  2. Right ventricular systolic function is moderately reduced. The right ventricular size is mildly enlarged. There is moderately elevated pulmonary artery systolic pressure.  3. Left atrial size was severely dilated.  4. Right atrial size was mildly dilated.  5. The mitral valve is normal in structure. Mild mitral valve  regurgitation. No evidence of mitral stenosis.  6. The aortic valve is normal in structure. Aortic valve regurgitation is not visualized. No aortic stenosis is present.  7. The inferior vena cava is dilated in size with <50% respiratory variability, suggesting right atrial pressure of 15 mmHg.  LHC 2021 Recurrent, probably ischemically mediated ventricular tachycardia with multiple shocks. High-grade native and diffuse in-stent restenosis ostial to mid  circumflex 95 to 99%.  There is also a 90% stenosis beyond the stented segment. Widely patent left main Eccentric bulky and calcified 50 to 65% proximal to mid LAD beyond the origin of the large first diagonal.  Eccentric bulky and calcified ostial to proximal 70% first diagonal.  Both LAD and diagonal are mildly hemodynamically significant based upon RFR values 0.89 and 0.87, respectively.  RFR 0.89 or less is hemodynamically significant. The right coronary particularly in the mid segment contains diffuse atherosclerosis but no focal high-grade obstruction. Left ventriculography is not performed because of renal insufficiency.  LVEDP is normal measuring 10 mmHg. RECOMMENDATIONS We need to determine if the patient has any surgical options.  The likelihood of durable circumflex result is low due to the diffuse nature of disease and the need to place stents over a long segment, most of which would be stent sandwich.  The LAD and diagonal are mildly hemodynamically significant and could be grafted. If PCI, will need orbital atherectomy over a long segment then reexpansion of the in-stent restenosis and if resistant, possibly shockwave PCI followed by extensive ostial to mid vessel restenting.    Neuro/Psych negative neurological ROS  negative psych ROS   GI/Hepatic Neg liver ROS, GERD  Medicated and Controlled,  Endo/Other  Hypothyroidism   Renal/GU   negative genitourinary   Musculoskeletal negative musculoskeletal ROS (+)   Abdominal (+) + obese,   Peds  Hematology  (+) Blood dyscrasia (on eliquis), ,   Anesthesia Other Findings   Reproductive/Obstetrics                           Anesthesia Physical Anesthesia Plan  ASA: IV  Anesthesia Plan: General   Post-op  Pain Management:    Induction: Intravenous  PONV Risk Score and Plan: 2 and Propofol infusion and Treatment may vary due to age or medical condition  Airway Management Planned: Mask and  Natural Airway  Additional Equipment:   Intra-op Plan:   Post-operative Plan:   Informed Consent: I have reviewed the patients History and Physical, chart, labs and discussed the procedure including the risks, benefits and alternatives for the proposed anesthesia with the patient or authorized representative who has indicated his/her understanding and acceptance.     Dental advisory given  Plan Discussed with: Anesthesiologist and CRNA  Anesthesia Plan Comments:        Anesthesia Quick Evaluation

## 2019-09-11 NOTE — Progress Notes (Signed)
   Subjective:  Patient seen at bedside this AM. Reports cardioversion went well. He states he's doing fine, just a little wheezing and needs his inhaler. Otherwise, mentions he's ready to go home. Denies increased SOB, CP.  Objective:  Vital signs in last 24 hours: Vitals:   09/10/19 2007 09/10/19 2054 09/10/19 2059 09/11/19 0429  BP: 118/79   102/66  Pulse: 85   66  Resp: 18   18  Temp: 97.6 F (36.4 C)   (!) 97.5 F (36.4 C)  TempSrc: Oral   Oral  SpO2: 95% (!) 89% 92% 98%  Weight:    100.9 kg  Height:       Physical Exam: General: Sitting on side of bed, no acute distress CV: Regular rate, rhythm. 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  #Acute on chronic hypoxic respiratory failure, resolved #Acute on chronic systolic heart failure, resolved #Ischemic cardiomyopathy s/p CABG NYHA class IV. Reports he feels fine this morning, like he's ready to go. Wheezing on exam this morning, although he has not received his daily inhalers yet. Otherwise, appears to be close to baseline and stable for discharge. Will have him follow-up with PCP and cardiologist. DCCV this morning, now in normal sinus rhythm. Cardiology following, appreciate recs. Will plan for d/c today given wheezing improves with inhalers and he  continues to be in sinus rhythm. -s/p DCCV -C/w ASA, Lipitor, po lasix -Trend BMP, Mg -Holding home bb -I&Os, daily weights  #Persistent Afib, on chronic AC #Recurrent episodes of VT s/p ICD CHADsVASC 5, c/w home eliquis. Patient no in normal sinus rhythm following DCCV earlier this morning. Will still plan on ablation procedure via Dr. Ladona Ridgel in October* with possible replacement of ICD. Plan to re-start home beta blocker at discharge. Appreciate EP assistance. -C/w eliquis, amiodarone -Tele  #AKI on CKD IIIa 2/2 hypovolemia Kidney function improving, almost back to baseline (sCr 1.35 < 1.5). (BL 1.28). Transitioned to po lasix yesterday, will continue for d/c. -Avoid nephrotoxic meds -F/u PCP  Chronic: #Hypothyroidism #COPD Wheezing on exam this morning 2/2 not getting home inhalers. Prior to d/c will ensure wheezing has improved. Will continue home levothyroxine.  Diet: HH, CM IVF: n/a  DVT PPX: Eliquis Code Status: DNR Prior to Admission Living Arrangement: home Anticipated Discharge Location: home Barriers to Discharge: medical management Dispo: Anticipated discharge in approximately 0 day(s).   Max Kanner, MD 09/11/2019, 6:23 AM Pager: [phone] After 5pm on weekdays and 1pm on weekends: On Call pager [phone]

## 2019-09-12 ENCOUNTER — Encounter (HOSPITAL_COMMUNITY): Payer: Self-pay | Admitting: Cardiology

## 2019-09-12 NOTE — Anesthesia Postprocedure Evaluation (Signed)
Anesthesia Post Note  Patient: Max Rivas  Procedure(s) Performed: CARDIOVERSION (N/A )     Patient location during evaluation: Endoscopy Anesthesia Type: General Level of consciousness: awake and alert Pain management: pain level controlled Vital Signs Assessment: post-procedure vital signs reviewed and stable Respiratory status: spontaneous breathing, nonlabored ventilation, respiratory function stable and patient connected to nasal cannula oxygen Cardiovascular status: blood pressure returned to baseline and stable Postop Assessment: no apparent nausea or vomiting Anesthetic complications: no   No complications documented.  Last Vitals:  Vitals:   09/11/19 0915 09/11/19 0949  BP: 119/61 119/67  Pulse: 74 88  Resp: 14 16  Temp:    SpO2: 92% 90%    Last Pain:  Vitals:   09/11/19 0949  TempSrc:   PainSc: 0-No pain                 Maneh Sieben L Mavery Milling

## 2019-09-12 NOTE — Care Management (Signed)
1146 09-12-19 Case Manager received call from patient that he was supposed to be on oxygen at 5 Liters at home. Patient has a 4 Liter tank in the home from [place]. Patient states that his saturation is going dwon to 71-78% and that he is using his portable tanks to keep his saturations up. Case Manager did make the provider aware and he was willing to write new order for 5 Liters continuous. Case Manager faxed information to [employer]. O2 tank to be delivered today. Case Manager did make the patient aware that if saturations continue to drop to call his PCP vs 911 if he becomes in distress to get him to the nearest ED.  No further needs from Case Manager at this time. Graves-Bigelow, Lamar Laundry, RN, BSN Case Manager

## 2019-09-13 LAB — CULTURE, BLOOD (ROUTINE X 2)
Culture: NO GROWTH
Special Requests: ADEQUATE

## 2019-09-14 LAB — CULTURE, BLOOD (ROUTINE X 2): Culture: NO GROWTH

## 2019-09-18 ENCOUNTER — Other Ambulatory Visit: Payer: Self-pay

## 2019-09-18 ENCOUNTER — Emergency Department (HOSPITAL_COMMUNITY): Payer: No Typology Code available for payment source

## 2019-09-18 ENCOUNTER — Encounter (HOSPITAL_COMMUNITY): Payer: Self-pay | Admitting: Emergency Medicine

## 2019-09-18 ENCOUNTER — Inpatient Hospital Stay (HOSPITAL_COMMUNITY)
Admission: EM | Admit: 2019-09-18 | Discharge: 2019-09-23 | DRG: 208 | Disposition: A | Discharge status: Home / Self Care | Payer: No Typology Code available for payment source | Attending: Family Medicine | Admitting: Family Medicine

## 2019-09-18 ENCOUNTER — Inpatient Hospital Stay (HOSPITAL_COMMUNITY): Payer: No Typology Code available for payment source

## 2019-09-18 DIAGNOSIS — Z9889 Other specified postprocedural states: Secondary | ICD-10-CM

## 2019-09-18 DIAGNOSIS — K5900 Constipation, unspecified: Secondary | ICD-10-CM | POA: Diagnosis present

## 2019-09-18 DIAGNOSIS — Z951 Presence of aortocoronary bypass graft: Secondary | ICD-10-CM | POA: Diagnosis not present

## 2019-09-18 DIAGNOSIS — R112 Nausea with vomiting, unspecified: Secondary | ICD-10-CM | POA: Diagnosis present

## 2019-09-18 DIAGNOSIS — E785 Hyperlipidemia, unspecified: Secondary | ICD-10-CM | POA: Diagnosis present

## 2019-09-18 DIAGNOSIS — J9622 Acute and chronic respiratory failure with hypercapnia: Secondary | ICD-10-CM | POA: Diagnosis not present

## 2019-09-18 DIAGNOSIS — J9601 Acute respiratory failure with hypoxia: Secondary | ICD-10-CM

## 2019-09-18 DIAGNOSIS — E039 Hypothyroidism, unspecified: Secondary | ICD-10-CM | POA: Diagnosis present

## 2019-09-18 DIAGNOSIS — F419 Anxiety disorder, unspecified: Secondary | ICD-10-CM | POA: Diagnosis present

## 2019-09-18 DIAGNOSIS — I5023 Acute on chronic systolic (congestive) heart failure: Secondary | ICD-10-CM | POA: Diagnosis present

## 2019-09-18 DIAGNOSIS — I252 Old myocardial infarction: Secondary | ICD-10-CM

## 2019-09-18 DIAGNOSIS — Z79899 Other long term (current) drug therapy: Secondary | ICD-10-CM

## 2019-09-18 DIAGNOSIS — Z5329 Procedure and treatment not carried out because of patient's decision for other reasons: Secondary | ICD-10-CM

## 2019-09-18 DIAGNOSIS — I472 Ventricular tachycardia, unspecified: Secondary | ICD-10-CM

## 2019-09-18 DIAGNOSIS — I255 Ischemic cardiomyopathy: Secondary | ICD-10-CM | POA: Diagnosis present

## 2019-09-18 DIAGNOSIS — Z888 Allergy status to other drugs, medicaments and biological substances status: Secondary | ICD-10-CM

## 2019-09-18 DIAGNOSIS — J90 Pleural effusion, not elsewhere classified: Secondary | ICD-10-CM | POA: Diagnosis not present

## 2019-09-18 DIAGNOSIS — I2510 Atherosclerotic heart disease of native coronary artery without angina pectoris: Secondary | ICD-10-CM | POA: Diagnosis present

## 2019-09-18 DIAGNOSIS — N179 Acute kidney failure, unspecified: Secondary | ICD-10-CM | POA: Diagnosis present

## 2019-09-18 DIAGNOSIS — J918 Pleural effusion in other conditions classified elsewhere: Secondary | ICD-10-CM | POA: Diagnosis present

## 2019-09-18 DIAGNOSIS — K219 Gastro-esophageal reflux disease without esophagitis: Secondary | ICD-10-CM | POA: Diagnosis present

## 2019-09-18 DIAGNOSIS — J441 Chronic obstructive pulmonary disease with (acute) exacerbation: Secondary | ICD-10-CM | POA: Diagnosis not present

## 2019-09-18 DIAGNOSIS — Z955 Presence of coronary angioplasty implant and graft: Secondary | ICD-10-CM

## 2019-09-18 DIAGNOSIS — E7800 Pure hypercholesterolemia, unspecified: Secondary | ICD-10-CM | POA: Diagnosis present

## 2019-09-18 DIAGNOSIS — I509 Heart failure, unspecified: Secondary | ICD-10-CM

## 2019-09-18 DIAGNOSIS — Z9981 Dependence on supplemental oxygen: Secondary | ICD-10-CM

## 2019-09-18 DIAGNOSIS — Z20822 Contact with and (suspected) exposure to covid-19: Secondary | ICD-10-CM | POA: Diagnosis not present

## 2019-09-18 DIAGNOSIS — J9621 Acute and chronic respiratory failure with hypoxia: Secondary | ICD-10-CM | POA: Diagnosis present

## 2019-09-18 DIAGNOSIS — Z95810 Presence of automatic (implantable) cardiac defibrillator: Secondary | ICD-10-CM

## 2019-09-18 DIAGNOSIS — I480 Paroxysmal atrial fibrillation: Secondary | ICD-10-CM | POA: Diagnosis present

## 2019-09-18 DIAGNOSIS — G4733 Obstructive sleep apnea (adult) (pediatric): Secondary | ICD-10-CM | POA: Diagnosis present

## 2019-09-18 DIAGNOSIS — Z6831 Body mass index (BMI) 31.0-31.9, adult: Secondary | ICD-10-CM

## 2019-09-18 DIAGNOSIS — J9602 Acute respiratory failure with hypercapnia: Secondary | ICD-10-CM

## 2019-09-18 DIAGNOSIS — Z87891 Personal history of nicotine dependence: Secondary | ICD-10-CM | POA: Diagnosis not present

## 2019-09-18 DIAGNOSIS — N1832 Chronic kidney disease, stage 3b: Secondary | ICD-10-CM | POA: Diagnosis present

## 2019-09-18 DIAGNOSIS — R1110 Vomiting, unspecified: Secondary | ICD-10-CM

## 2019-09-18 DIAGNOSIS — F40240 Claustrophobia: Secondary | ICD-10-CM | POA: Diagnosis not present

## 2019-09-18 DIAGNOSIS — Z8249 Family history of ischemic heart disease and other diseases of the circulatory system: Secondary | ICD-10-CM

## 2019-09-18 DIAGNOSIS — Z7901 Long term (current) use of anticoagulants: Secondary | ICD-10-CM

## 2019-09-18 DIAGNOSIS — I447 Left bundle-branch block, unspecified: Secondary | ICD-10-CM | POA: Diagnosis present

## 2019-09-18 DIAGNOSIS — Z7982 Long term (current) use of aspirin: Secondary | ICD-10-CM

## 2019-09-18 DIAGNOSIS — Z86711 Personal history of pulmonary embolism: Secondary | ICD-10-CM

## 2019-09-18 LAB — I-STAT CHEM 8, ED
BUN: 17 mg/dL (ref 8–23)
Calcium, Ion: 1.06 mmol/L — ABNORMAL LOW (ref 1.15–1.40)
Chloride: 97 mmol/L — ABNORMAL LOW (ref 98–111)
Creatinine, Ser: 1.4 mg/dL — ABNORMAL HIGH (ref 0.61–1.24)
Glucose, Bld: 215 mg/dL — ABNORMAL HIGH (ref 70–99)
HCT: 50 % (ref 39.0–52.0)
Hemoglobin: 17 g/dL (ref 13.0–17.0)
Potassium: 3.7 mmol/L (ref 3.5–5.1)
Sodium: 136 mmol/L (ref 135–145)
TCO2: 30 mmol/L (ref 22–32)

## 2019-09-18 LAB — POCT I-STAT 7, (LYTES, BLD GAS, ICA,H+H)
Acid-Base Excess: 3 mmol/L — ABNORMAL HIGH (ref 0.0–2.0)
Bicarbonate: 29.8 mmol/L — ABNORMAL HIGH (ref 20.0–28.0)
Calcium, Ion: 1.09 mmol/L — ABNORMAL LOW (ref 1.15–1.40)
HCT: 48 % (ref 39.0–52.0)
Hemoglobin: 16.3 g/dL (ref 13.0–17.0)
O2 Saturation: 100 %
Patient temperature: 97.8
Potassium: 4.6 mmol/L (ref 3.5–5.1)
Sodium: 135 mmol/L (ref 135–145)
TCO2: 31 mmol/L (ref 22–32)
pCO2 arterial: 50.6 mmHg — ABNORMAL HIGH (ref 32.0–48.0)
pH, Arterial: 7.376 (ref 7.350–7.450)
pO2, Arterial: 298 mmHg — ABNORMAL HIGH (ref 83.0–108.0)

## 2019-09-18 LAB — TYPE AND SCREEN
ABO/RH(D): A POS
Antibody Screen: NEGATIVE

## 2019-09-18 LAB — CBC WITH DIFFERENTIAL/PLATELET
Abs Immature Granulocytes: 0.06 K/uL (ref 0.00–0.07)
Basophils Absolute: 0.1 K/uL (ref 0.0–0.1)
Basophils Relative: 0 %
Eosinophils Absolute: 0 K/uL (ref 0.0–0.5)
Eosinophils Relative: 0 %
HCT: 48.6 % (ref 39.0–52.0)
Hemoglobin: 15.1 g/dL (ref 13.0–17.0)
Immature Granulocytes: 1 %
Lymphocytes Relative: 14 %
Lymphs Abs: 1.7 K/uL (ref 0.7–4.0)
MCH: 28.4 pg (ref 26.0–34.0)
MCHC: 31.1 g/dL (ref 30.0–36.0)
MCV: 91.5 fL (ref 80.0–100.0)
Monocytes Absolute: 1.2 K/uL — ABNORMAL HIGH (ref 0.1–1.0)
Monocytes Relative: 10 %
Neutro Abs: 9.3 K/uL — ABNORMAL HIGH (ref 1.7–7.7)
Neutrophils Relative %: 75 %
Platelets: 238 K/uL (ref 150–400)
RBC: 5.31 MIL/uL (ref 4.22–5.81)
RDW: 16.5 % — ABNORMAL HIGH (ref 11.5–15.5)
WBC: 12.3 K/uL — ABNORMAL HIGH (ref 4.0–10.5)
nRBC: 0 % (ref 0.0–0.2)

## 2019-09-18 LAB — BASIC METABOLIC PANEL WITH GFR
Anion gap: 15 (ref 5–15)
BUN: 15 mg/dL (ref 8–23)
CO2: 25 mmol/L (ref 22–32)
Calcium: 8.8 mg/dL — ABNORMAL LOW (ref 8.9–10.3)
Chloride: 96 mmol/L — ABNORMAL LOW (ref 98–111)
Creatinine, Ser: 1.57 mg/dL — ABNORMAL HIGH (ref 0.61–1.24)
GFR calc Af Amer: 52 mL/min — ABNORMAL LOW
GFR calc non Af Amer: 45 mL/min — ABNORMAL LOW
Glucose, Bld: 219 mg/dL — ABNORMAL HIGH (ref 70–99)
Potassium: 3.7 mmol/L (ref 3.5–5.1)
Sodium: 136 mmol/L (ref 135–145)

## 2019-09-18 LAB — MRSA PCR SCREENING: MRSA by PCR: NEGATIVE

## 2019-09-18 LAB — TROPONIN I (HIGH SENSITIVITY)
Troponin I (High Sensitivity): 15 ng/L
Troponin I (High Sensitivity): 43 ng/L — ABNORMAL HIGH (ref <18)

## 2019-09-18 LAB — SARS CORONAVIRUS 2 BY RT PCR (HOSPITAL ORDER, PERFORMED IN ~~LOC~~ HOSPITAL LAB): SARS Coronavirus 2: NEGATIVE

## 2019-09-18 LAB — BRAIN NATRIURETIC PEPTIDE: B Natriuretic Peptide: 601 pg/mL — ABNORMAL HIGH (ref 0.0–100.0)

## 2019-09-18 LAB — MAGNESIUM: Magnesium: 2 mg/dL (ref 1.7–2.4)

## 2019-09-18 MED ORDER — ACETAMINOPHEN 325 MG PO TABS: 650.0000 mg | ORAL_TABLET | ORAL | Status: DC | PRN

## 2019-09-18 MED ORDER — MAGNESIUM SULFATE 2 GM/50ML IV SOLN
2.0000 g | Freq: Once | INTRAVENOUS | Status: AC
  Administered 2019-09-18: 2 g via INTRAVENOUS
  Filled 2019-09-18: qty 50

## 2019-09-18 MED ORDER — FENTANYL BOLUS VIA INFUSION
25.0000 ug | INTRAVENOUS | Status: DC | PRN
  Administered 2019-09-19: 25 ug via INTRAVENOUS
  Filled 2019-09-18: qty 25

## 2019-09-18 MED ORDER — FENTANYL 2500MCG IN NS 250ML (10MCG/ML) PREMIX INFUSION
25.0000 ug/h | INTRAVENOUS | Status: DC
  Administered 2019-09-18: 25 ug/h via INTRAVENOUS
  Filled 2019-09-18: qty 250

## 2019-09-18 MED ORDER — SUCCINYLCHOLINE CHLORIDE 20 MG/ML IJ SOLN
150.0000 mg | Freq: Once | INTRAMUSCULAR | Status: DC
  Filled 2019-09-18: qty 7.5

## 2019-09-18 MED ORDER — LACTATED RINGERS IV SOLN: INTRAVENOUS | Status: DC

## 2019-09-18 MED ORDER — ROCURONIUM BROMIDE 50 MG/5ML IV SOLN
INTRAVENOUS | Status: AC | PRN
  Administered 2019-09-18: 4990 mg via INTRAVENOUS
  Administered 2019-09-18: 50 mg via INTRAVENOUS

## 2019-09-18 MED ORDER — NOREPINEPHRINE 4 MG/250ML-% IV SOLN
INTRAVENOUS | Status: AC
  Administered 2019-09-18: 2 ug/min via INTRAVENOUS
  Filled 2019-09-18: qty 250

## 2019-09-18 MED ORDER — FENTANYL CITRATE (PF) 100 MCG/2ML IJ SOLN: 100.0000 ug | Freq: Once | INTRAMUSCULAR | Status: DC

## 2019-09-18 MED ORDER — MIDAZOLAM HCL 2 MG/2ML IJ SOLN
INTRAMUSCULAR | Status: AC | PRN
  Administered 2019-09-18: 2 mg via INTRAVENOUS

## 2019-09-18 MED ORDER — AMIODARONE HCL IN DEXTROSE 360-4.14 MG/200ML-% IV SOLN
30.0000 mg/h | INTRAVENOUS | Status: AC
  Administered 2019-09-18 – 2019-09-21 (×6): 30 mg/h via INTRAVENOUS
  Filled 2019-09-18 (×5): qty 200

## 2019-09-18 MED ORDER — POTASSIUM CHLORIDE 10 MEQ/100ML IV SOLN
10.0000 meq | INTRAVENOUS | Status: AC
  Administered 2019-09-18 (×3): 10 meq via INTRAVENOUS
  Filled 2019-09-18 (×3): qty 100

## 2019-09-18 MED ORDER — BUDESONIDE 0.25 MG/2ML IN SUSP
0.2500 mg | Freq: Two times a day (BID) | RESPIRATORY_TRACT | Status: DC
  Administered 2019-09-18 – 2019-09-19 (×2): 0.25 mg via RESPIRATORY_TRACT
  Filled 2019-09-18 (×2): qty 2

## 2019-09-18 MED ORDER — LIDOCAINE BOLUS VIA INFUSION
50.0000 mg | Freq: Once | INTRAVENOUS | Status: DC
  Filled 2019-09-18: qty 52

## 2019-09-18 MED ORDER — FUROSEMIDE 10 MG/ML IJ SOLN
60.0000 mg | Freq: Once | INTRAMUSCULAR | Status: AC
  Administered 2019-09-18: 60 mg via INTRAVENOUS
  Filled 2019-09-18: qty 6

## 2019-09-18 MED ORDER — ETOMIDATE 2 MG/ML IV SOLN: 30.0000 mg | Freq: Once | INTRAVENOUS | Status: DC

## 2019-09-18 MED ORDER — FENTANYL CITRATE (PF) 100 MCG/2ML IJ SOLN
INTRAMUSCULAR | Status: AC | PRN
  Administered 2019-09-18: 100 ug via INTRAVENOUS

## 2019-09-18 MED ORDER — REVEFENACIN 175 MCG/3ML IN SOLN
175.0000 ug | Freq: Every day | RESPIRATORY_TRACT | Status: DC
  Administered 2019-09-19 – 2019-09-22 (×4): 175 ug via RESPIRATORY_TRACT
  Filled 2019-09-18 (×6): qty 3

## 2019-09-18 MED ORDER — ETOMIDATE 2 MG/ML IV SOLN
INTRAVENOUS | Status: AC | PRN
  Administered 2019-09-18: 20 mg via INTRAVENOUS

## 2019-09-18 MED ORDER — ETOMIDATE 2 MG/ML IV SOLN
INTRAVENOUS | Status: AC | PRN
  Administered 2019-09-18: 10 mg via INTRAVENOUS

## 2019-09-18 MED ORDER — LIDOCAINE IN D5W 4-5 MG/ML-% IV SOLN
2.0000 mg/min | INTRAVENOUS | Status: DC
  Filled 2019-09-18: qty 500

## 2019-09-18 MED ORDER — AMIODARONE HCL IN DEXTROSE 360-4.14 MG/200ML-% IV SOLN
60.0000 mg/h | INTRAVENOUS | Status: AC
  Administered 2019-09-18 (×2): 60 mg/h via INTRAVENOUS
  Filled 2019-09-18: qty 200

## 2019-09-18 MED ORDER — HEPARIN SODIUM (PORCINE) 5000 UNIT/ML IJ SOLN: 5000.0000 [IU] | Freq: Three times a day (TID) | INTRAMUSCULAR | Status: DC

## 2019-09-18 MED ORDER — DOCUSATE SODIUM 100 MG PO CAPS
100.0000 mg | ORAL_CAPSULE | Freq: Two times a day (BID) | ORAL | Status: DC | PRN
  Filled 2019-09-18: qty 1

## 2019-09-18 MED ORDER — PANTOPRAZOLE SODIUM 40 MG IV SOLR
40.0000 mg | Freq: Every day | INTRAVENOUS | Status: DC
  Administered 2019-09-18 – 2019-09-19 (×2): 40 mg via INTRAVENOUS
  Filled 2019-09-18 (×2): qty 40

## 2019-09-18 MED ORDER — MIDAZOLAM HCL 2 MG/2ML IJ SOLN: 2.0000 mg | Freq: Once | INTRAMUSCULAR | Status: DC

## 2019-09-18 MED ORDER — METHYLPREDNISOLONE SODIUM SUCC 125 MG IJ SOLR: 60.0000 mg | Freq: Two times a day (BID) | INTRAMUSCULAR | Status: DC

## 2019-09-18 MED ORDER — POLYETHYLENE GLYCOL 3350 17 G PO PACK
17.0000 g | PACK | Freq: Every day | ORAL | Status: DC | PRN
  Filled 2019-09-18: qty 1

## 2019-09-18 MED ORDER — POTASSIUM CHLORIDE 10 MEQ/100ML IV SOLN
10.0000 meq | Freq: Once | INTRAVENOUS | Status: AC
  Administered 2019-09-18: 10 meq via INTRAVENOUS

## 2019-09-18 MED ORDER — ORAL CARE MOUTH RINSE
15.0000 mL | OROMUCOSAL | Status: DC
  Administered 2019-09-18 – 2019-09-20 (×17): 15 mL via OROMUCOSAL

## 2019-09-18 MED ORDER — METHYLPREDNISOLONE SODIUM SUCC 125 MG IJ SOLR
125.0000 mg | Freq: Once | INTRAMUSCULAR | Status: AC
  Administered 2019-09-18: 125 mg via INTRAVENOUS
  Filled 2019-09-18: qty 2

## 2019-09-18 MED ORDER — NOREPINEPHRINE 4 MG/250ML-% IV SOLN: 2.0000 ug/min | INTRAVENOUS | Status: DC

## 2019-09-18 MED ORDER — ONDANSETRON HCL 4 MG/2ML IJ SOLN
4.0000 mg | Freq: Four times a day (QID) | INTRAMUSCULAR | Status: DC | PRN
  Administered 2019-09-19 – 2019-09-20 (×3): 4 mg via INTRAVENOUS
  Filled 2019-09-18 (×3): qty 2

## 2019-09-18 MED ORDER — AMIODARONE LOAD VIA INFUSION
150.0000 mg | Freq: Once | INTRAVENOUS | Status: AC
  Administered 2019-09-18: 150 mg via INTRAVENOUS
  Filled 2019-09-18: qty 83.34

## 2019-09-18 MED ORDER — CHLORHEXIDINE GLUCONATE 0.12% ORAL RINSE (MEDLINE KIT)
15.0000 mL | Freq: Two times a day (BID) | OROMUCOSAL | Status: DC
  Administered 2019-09-18 – 2019-09-23 (×6): 15 mL via OROMUCOSAL

## 2019-09-18 MED ORDER — METHYLPREDNISOLONE SODIUM SUCC 125 MG IJ SOLR
60.0000 mg | Freq: Two times a day (BID) | INTRAMUSCULAR | Status: DC
  Administered 2019-09-18 – 2019-09-19 (×2): 60 mg via INTRAVENOUS
  Filled 2019-09-18 (×2): qty 2

## 2019-09-18 MED ORDER — HEPARIN (PORCINE) 25000 UT/250ML-% IV SOLN
1200.0000 [IU]/h | INTRAVENOUS | Status: DC
  Administered 2019-09-18: 1400 [IU]/h via INTRAVENOUS
  Administered 2019-09-19 – 2019-09-20 (×2): 1200 [IU]/h via INTRAVENOUS
  Filled 2019-09-18 (×3): qty 250

## 2019-09-18 MED ORDER — SODIUM CHLORIDE 0.9 % IV SOLN
INTRAVENOUS | Status: AC | PRN
  Administered 2019-09-18: 10 mL/h via INTRAVENOUS

## 2019-09-18 MED ORDER — AMIODARONE HCL IN DEXTROSE 360-4.14 MG/200ML-% IV SOLN
INTRAVENOUS | Status: AC
  Administered 2019-09-18: 60 mg/h via INTRAVENOUS
  Filled 2019-09-18: qty 200

## 2019-09-18 MED ORDER — MORPHINE SULFATE (PF) 2 MG/ML IV SOLN: 2.0000 mg | INTRAVENOUS | Status: DC | PRN

## 2019-09-18 MED ORDER — MIDAZOLAM HCL 2 MG/2ML IJ SOLN
INTRAMUSCULAR | Status: AC
  Filled 2019-09-18: qty 2

## 2019-09-18 MED ORDER — FENTANYL CITRATE (PF) 100 MCG/2ML IJ SOLN
INTRAMUSCULAR | Status: AC
  Filled 2019-09-18: qty 2

## 2019-09-18 MED ORDER — PROPOFOL 1000 MG/100ML IV EMUL
0.0000 ug/kg/min | INTRAVENOUS | Status: DC
  Administered 2019-09-19: 10 ug/kg/min via INTRAVENOUS
  Filled 2019-09-18: qty 100

## 2019-09-18 MED ORDER — ALBUTEROL SULFATE (2.5 MG/3ML) 0.083% IN NEBU: 2.5000 mg | INHALATION_SOLUTION | Freq: Four times a day (QID) | RESPIRATORY_TRACT | Status: DC | PRN

## 2019-09-18 MED ORDER — SODIUM CHLORIDE 0.9 % IV SOLN
250.0000 mL | INTRAVENOUS | Status: DC
  Administered 2019-09-18: 250 mL via INTRAVENOUS

## 2019-09-18 MED ORDER — AMIODARONE IV BOLUS ONLY 150 MG/100ML: 150.0000 mg | INTRAVENOUS | Status: DC | PRN

## 2019-09-18 NOTE — ED Triage Notes (Signed)
Pt BIB [facility] from home. Complaint of vtach, pt states his internal defibrillator shocked him once at home. Per EMS pt given amiodarone and converted into afib, enroute pt went back into vtach, amiodarone administered again. Afib upon arrival to department.

## 2019-09-18 NOTE — H&P (Addendum)
Physician History and Physical     Patient ID: Max Rivas MRN: [id number] DOB/AGE: Aug 16, 1950 69 y.o. Admit date: 09/18/2019  Primary Care Physician: Anson Fret, MD Primary Cardiologist: Ladona Ridgel  Active Problems:   * No active hospital problems. *   HPI:  69 y.o. 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 History of PAF on eliquis with recent [facility] 8/31 for afib.  History of single lead AICD and recurrent VT RX with oral amiodarone and mexiletine Was to have VT ablation at [facility] in October. Felt well yesterday with some increasing LE edema. This am had increasing dyspnea EMS noted VT Review of ECG;s shows monomorphic VT. He noted AICD d/c this am. With iv amiodarone has converted to NSR/PAF.  Had recurrent VT in ER and shocked x2 with resumption of NSR. Currently in sinus tachycardia No chest pain and troponin negative. Has received IV amiodarone and iv lasix. Has been on eliquis so PE less likely. Lytes are ok.  CXR with CHF bilateral effusions BNP elevated in 600 range. He has had his vaccine and is COVID negative. Cannot tolerate Bipap.  Discussed with ER doctor and critical care tenuous breathing status and likely need for intubation. He quit smoking after his bypass surgery     Review of systems complete and found to be negative unless listed above   Past Medical History:  Diagnosis Date   Arthritis    "elbows, knees" (02/24/2016)   Bronchitis 2006   CAD (coronary artery disease)    a. s/p prior MIs - 1995 x 2, 1998; b. s/p prior LCX stenting; c. 04/2019 Cath: LM min irregs, LAD 65p/mi, D1 75, LCX 99ost/p, 21m (ISR), OM2 80, RCA/RPDA mod diff dzs; d. 05/2019 CABG x 3: LIMA->LAD, VG->Diag, VG->OM.   COPD (chronic obstructive pulmonary disease) (HCC)    a. Remote tobacco-->on home O2.   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 05/2019 Echo: EF 40-45%, gr2 DD. Nl RV size/fxn. Mildly dil LA. Mild MR.   High cholesterol    Ischemic cardiomyopathy    a. 05/2019 Echo: EF 40-45%.   Morbid obesity (HCC)    Myocardial infarction (HCC) ~ 1995 X 2;1998; 2000; 2004   On home oxygen therapy    "2L w/activity" (02/24/2016)   PAF (paroxysmal atrial fibrillation) (HCC)    a. CHA2DS2VASc = 4-->eliquis. Also on amio.   Pulmonary embolism (HCC) 02/24/2016   Sleep apnea    "have CPAP; can't tolerate it" (02/24/2016)   Ventricular tachycardia (HCC)    a. 2005 s/p ICD; b. 03/2011 Device upgrade/lead exchange: MDT Protecta XT VR single lead AICD.    Family History  Problem Relation Age of Onset   Heart attack Mother    Heart attack Father    Heart attack Sister 16    Social History   Socioeconomic History   Marital status: Divorced    Spouse name: Not on file   Number of children: Not on file   Years of education: Not on file   Highest education level: Not on file  Occupational History   Not on file  Tobacco Use   Smoking status: Former Smoker    Packs/day: 3.00    Years: 35.00    Pack years: 105.00    Types: Cigarettes    Quit date: 05/22/2019  Years since quitting: 0.3   Smokeless tobacco: Former Neurosurgeon    Quit date: 05/21/2017  Vaping Use   Vaping Use: Never used  Substance and Sexual Activity   Alcohol use: Not Currently   Drug use: Not Currently    Types: Cocaine    Comment: none since 1995   Sexual activity: Yes  Other Topics Concern   Not on file  Social History Narrative   Not on file   Social Determinants of Health   Financial Resource Strain:    Difficulty of Paying Living Expenses: Not on file  Food Insecurity:    Worried About Programme researcher, broadcasting/film/video in the Last Year: Not on file   [employer] of Food in the Last Year: Not on file  Transportation Needs:    Lack of Transportation (Medical): Not on file   Lack of Transportation (Non-Medical): Not on  file  Physical Activity:    Days of Exercise per Week: Not on file   Minutes of Exercise per Session: Not on file  Stress:    Feeling of Stress : Not on file  Social Connections:    Frequency of Communication with Friends and Family: Not on file   Frequency of Social Gatherings with Friends and Family: Not on file   Attends Religious Services: Not on file   Active Member of Clubs or Organizations: Not on file   Attends Banker Meetings: Not on file   Marital Status: Not on file  Intimate Partner Violence:    Fear of Current or Ex-Partner: Not on file   Emotionally Abused: Not on file   Physically Abused: Not on file   Sexually Abused: Not on file    Past Surgical History:  Procedure Laterality Date   CARDIOVERSION N/A 09/11/2019   Procedure: CARDIOVERSION;  Surgeon: Jodelle Red, MD;  Location: [facility] ENDOSCOPY;  Service: Cardiovascular;  Laterality: N/A;   CLIPPING OF ATRIAL APPENDAGE N/A 05/23/2019   Procedure: CLIPPING OF ATRIAL APPENDAGE with atriclip;  Surgeon: [name], MD;  Location: [facility] OR;  Service: Open Heart Surgery;  Laterality: N/A;   CORONARY ANGIOPLASTY  1995   CORONARY ANGIOPLASTY WITH STENT PLACEMENT  ~ 1995 - 2004 X 5   "I've got a total of 5 stents" (02/24/2016)   CORONARY ARTERY BYPASS GRAFT N/A 05/23/2019   Procedure: CORONARY ARTERY BYPASS GRAFTING (CABG), times three, using right greater saphenous vein and left internal mammary;  Surgeon: [name], MD;  Location: [facility] OR;  Service: Open Heart Surgery;  Laterality: N/A;  [place] only   INSERT / REPLACE / REMOVE PACEMAKER  07/2003   original PPM around 2004 for ICM with EF < 35%   INTRAVASCULAR PRESSURE WIRE/FFR STUDY N/A 05/12/2019   Procedure: INTRAVASCULAR PRESSURE WIRE/FFR STUDY;  Surgeon: Lyn Records, MD;  Location: [facility] INVASIVE CV LAB;  Service: Cardiovascular;  Laterality: N/A;   LEFT HEART CATH AND CORONARY ANGIOGRAPHY N/A 05/12/2019   Procedure: LEFT HEART  CATH AND CORONARY ANGIOGRAPHY;  Surgeon: Lyn Records, MD;  Location: [facility] INVASIVE CV LAB;  Service: Cardiovascular;  Laterality: N/A;   PACEMAKER GENERATOR CHANGE  03/2011    [facility] [place]   TEE WITHOUT CARDIOVERSION N/A 05/23/2019   Procedure: TRANSESOPHAGEAL ECHOCARDIOGRAM (TEE);  Surgeon: [name], MD;  Location: [facility] OR;  Service: Open Heart Surgery;  Laterality: N/A;   TONSILLECTOMY       (Not in a hospital admission)   Physical Exam: Blood pressure 124/83, pulse (!) 136, resp. rate Marland Kitchen)  27, height 5\' 11"  (1.803 m), weight 99.8 kg, SpO2 94 %.    Obese chronically ill male Tachypnea Post sternotomy no murmur Diffuse wheezing rhonchi decreased BS base Abdomen soft Plus 2 LE edema  No current facility-administered medications on file prior to encounter.   Current Outpatient Medications on File Prior to Encounter  Medication Sig Dispense Refill   acetaminophen (TYLENOL) 325 MG tablet Take 2 tablets (650 mg total) by mouth every 6 (six) hours as needed for mild pain (or Fever >/= 101). 30 tablet 0   amiodarone (PACERONE) 200 MG tablet Take 1 tablet (200 mg total) by mouth daily. (Patient taking differently: Take 200 mg by mouth daily. Patient states taking once a day MD note from 8/20 states increase to bid) 30 tablet 1   apixaban (ELIQUIS) 5 MG TABS tablet Take 5 mg by mouth 2 (two) times daily.     aspirin 81 MG chewable tablet Chew 1 tablet (81 mg total) by mouth daily. 90 tablet 3   atorvastatin (LIPITOR) 80 MG tablet Take 1 tablet (80 mg total) by mouth daily. 90 tablet 3   carboxymethylcellulose (REFRESH PLUS) 0.5 % SOLN Place 1 drop into both eyes at bedtime.     Cholecalciferol (VITAMIN D3) 50 MCG (2000 UT) TABS Take 2,000 Units by mouth daily.     dextromethorphan-guaiFENesin (MUCINEX DM) 30-600 MG 12hr tablet Take 1 tablet by mouth 2 (two) times daily as needed for cough. (Patient not taking: Reported on 09/08/2019)     furosemide (LASIX) 40 MG tablet Take 40-80  mg by mouth 2 (two) times daily. Take 2 tablets (80 mg) in the morning and Take 1 tablet (40 mg) at bedtime     gabapentin (NEURONTIN) 100 MG capsule Take 2 capsules (200 mg total) by mouth 2 (two) times daily. 60 capsule 1   levalbuterol (XOPENEX HFA) 45 MCG/ACT inhaler Inhale 2 puffs into the lungs every 6 (six) hours as needed for wheezing or shortness of breath.     levothyroxine (SYNTHROID) 75 MCG tablet Take 75 mcg by mouth daily before breakfast.     metoprolol succinate (TOPROL-XL) 25 MG 24 hr tablet Take 12.5 mg by mouth daily.     mexiletine (MEXITIL) 150 MG capsule Take 150 mg by mouth 2 (two) times daily.     mometasone (ASMANEX) 220 MCG/INH inhaler Inhale 2 puffs into the lungs 2 (two) times daily.      montelukast (SINGULAIR) 10 MG tablet Take 10 mg by mouth daily.      Multiple Vitamin (MULTIVITAMIN WITH MINERALS) TABS tablet Take 1 tablet by mouth daily.     nitroGLYCERIN (NITROSTAT) 0.4 MG SL tablet Place 0.4 mg under the tongue every 5 (five) minutes as needed for chest pain.      omega-3 acid ethyl esters (LOVAZA) 1 g capsule Take 1 capsule (1 g total) by mouth 2 (two) times daily. 60 capsule 0   omeprazole (PRILOSEC) 20 MG capsule Take 20 mg by mouth 2 (two) times daily before a meal.     Propylene Glycol (SYSTANE BALANCE) 0.6 % SOLN Place 1 drop into both eyes See admin instructions. Place 1 drop into each eye four times a day and apply a warm compress for 10 minutes afterwards     Tiotropium Bromide-Olodaterol (STIOLTO RESPIMAT) 2.5-2.5 MCG/ACT AERS Inhale 2 puffs into the lungs daily.       Labs:   Lab Results  Component Value Date   WBC 12.3 (H) 09/18/2019   HGB 17.0  09/18/2019   HCT 50.0 09/18/2019   MCV 91.5 09/18/2019   PLT 238 09/18/2019    Recent Labs  Lab 09/18/19 1247 09/18/19 1247 09/18/19 1259  NA 136   < > 136  K 3.7   < > 3.7  CL 96*   < > 97*  CO2 25  --   --   BUN 15   < > 17  CREATININE 1.57*   < > 1.40*  CALCIUM 8.8*  --   --    GLUCOSE 219*   < > 215*   < > = values in this interval not displayed.   Lab Results  Component Value Date   TROPONINI <0.03 06/22/2018   TROPONINI <0.03 06/22/2018   TROPONINI <0.03 06/22/2018     Lab Results  Component Value Date   CHOL 126 05/13/2019   Lab Results  Component Value Date   HDL 36 (L) 05/13/2019   Lab Results  Component Value Date   LDLCALC 70 05/13/2019   Lab Results  Component Value Date   TRIG 101 05/13/2019   Lab Results  Component Value Date   CHOLHDL 3.5 05/13/2019   No results found for: LDLDIRECT     Radiology: DG Chest 2 View  Result Date: 08/28/2019 CLINICAL DATA:  Chest pain. EXAM: CHEST - 2 VIEW COMPARISON:  Most recent radiograph 06/28/2019.  Chest CT 07/25/2018 FINDINGS: Median sternotomy. Clipping of left atrial appendage. Left-sided pacemaker in place with unchanged leads. Stable cardiomediastinal contours with upper normal heart size. Chronic right pleural thickening and pleural calcifications. Scarring at the right lung base. Small left pleural effusion has improved from prior exam. No pulmonary edema or acute airspace disease. No pneumothorax. No acute osseous abnormalities are seen. IMPRESSION: 1. No acute findings. 2. Small left pleural effusion has improved from prior exam. 3. Chronic right pleural thickening, pleural calcifications, and basilar scarring. Electronically Signed   By: Narda Rutherford M.D.   On: 08/28/2019 01:33   DG Chest Portable 1 View  Result Date: 09/18/2019 CLINICAL DATA:  Ventricular tachycardia. EXAM: PORTABLE CHEST 1 VIEW COMPARISON:  September 08, 2019 FINDINGS: Cardiac pacemaker in stable position. Cardiomediastinal silhouette is stable. Bilateral pleural effusions. Increased interstitial markings superimposed on pre-existing pleural subpleural scarring. Osseous structures are without acute abnormality. Soft tissues are grossly normal. IMPRESSION: 1. Bilateral pleural effusions. 2. Increased interstitial markings  superimposed on pre-existing pleural subpleural scarring, likely represent interstitial pulmonary edema. Electronically Signed   By: Ted Mcalpine M.D.   On: 09/18/2019 13:22   DG Chest Port 1 View  Result Date: 09/08/2019 CLINICAL DATA:  Shortness of breath EXAM: PORTABLE CHEST 1 VIEW COMPARISON:  08/28/2019 FINDINGS: Left AICD remains in place, unchanged. Cardiomegaly, vascular congestion. There is hyperinflation of the lungs compatible with COPD. Probable underlying fibrosis within the lungs. Small right effusion and moderate left effusion. Mid and lower lung increased interstitial markings and airspace disease likely reflect superimposed edema. IMPRESSION: Cardiomegaly. COPD/chronic lung disease. Bilateral pleural effusions, enlarged since prior study, moderate on the left. Probable mild superimposed pulmonary edema. Electronically Signed   By: Charlett Nose M.D.   On: 09/08/2019 18:05   ECHOCARDIOGRAM COMPLETE  Result Date: 09/09/2019    ECHOCARDIOGRAM REPORT   Patient Name:   JT HAEFS Date of Exam: 09/09/2019 Medical Rec #:  [id number]      Height:       71.0 in Accession #:    [id number]     Weight:       221.0  lb Date of Birth:  1950-09-08      BSA:          2.200 m Patient Age:    65 years       BP:           107/74 mmHg Patient Gender: M              HR:           84 bpm. Exam Location:  Inpatient Procedure: 2D Echo, Color Doppler, Cardiac Doppler and Intracardiac            Opacification Agent Indications:    I48.91* Unspecified atrial fibrillation  History:        Patient has prior history of Echocardiogram examinations, most                 recent 05/23/2019. Pacemaker and Defibrillator, COPD,                 Arrythmias:Atrial Fibrillation; Risk Factors:Dyslipidemia and                 Sleep Apnea.  Sonographer:    Irving Burton Senior RDCS Referring Phys: [phone] [facility] [place] [facility]  Sonographer Comments: Technically difficult, patient scanned sitting up due to difficulty breathing.  IMPRESSIONS  1. Left ventricular ejection fraction, by estimation, is 30 to 35%. The left ventricle has moderately decreased function. The left ventricle demonstrates global hypokinesis. The left ventricular internal cavity size was moderately dilated. Left ventricular diastolic parameters are consistent with Grade I diastolic dysfunction (impaired relaxation).  2. Right ventricular systolic function is moderately reduced. The right ventricular size is mildly enlarged. There is moderately elevated pulmonary artery systolic pressure.  3. Left atrial size was severely dilated.  4. Right atrial size was mildly dilated.  5. The mitral valve is normal in structure. Mild mitral valve regurgitation. No evidence of mitral stenosis.  6. The aortic valve is normal in structure. Aortic valve regurgitation is not visualized. No aortic stenosis is present.  7. The inferior vena cava is dilated in size with <50% respiratory variability, suggesting right atrial pressure of 15 mmHg. FINDINGS  Left Ventricle: Left ventricular ejection fraction, by estimation, is 30 to 35%. The left ventricle has moderately decreased function. The left ventricle demonstrates global hypokinesis. Definity contrast agent was given IV to delineate the left ventricular endocardial borders. The left ventricular internal cavity size was moderately dilated. There is no left ventricular hypertrophy. Left ventricular diastolic parameters are consistent with Grade I diastolic dysfunction (impaired relaxation).  LV Wall Scoring: The basal inferolateral segment is akinetic. Right Ventricle: The right ventricular size is mildly enlarged. No increase in right ventricular wall thickness. Right ventricular systolic function is moderately reduced. There is moderately elevated pulmonary artery systolic pressure. The tricuspid regurgitant velocity is 2.88 m/s, and with an assumed right atrial pressure of 15 mmHg, the estimated right ventricular systolic pressure is 48.2  mmHg. Left Atrium: Left atrial size was severely dilated. Right Atrium: Right atrial size was mildly dilated. Pericardium: There is no evidence of pericardial effusion. Mitral Valve: The mitral valve is normal in structure. Normal mobility of the mitral valve leaflets. Mild mitral valve regurgitation. No evidence of mitral valve stenosis. Tricuspid Valve: The tricuspid valve is normal in structure. Tricuspid valve regurgitation is mild . No evidence of tricuspid stenosis. Aortic Valve: The aortic valve is normal in structure. Aortic valve regurgitation is not visualized. No aortic stenosis is present. Pulmonic Valve: The pulmonic valve was normal in structure.  Pulmonic valve regurgitation is not visualized. No evidence of pulmonic stenosis. Aorta: The aortic root is normal in size and structure. Venous: The inferior vena cava is dilated in size with less than 50% respiratory variability, suggesting right atrial pressure of 15 mmHg. IAS/Shunts: No atrial level shunt detected by color flow Doppler. Additional Comments: A pacer wire is visualized.  LEFT VENTRICLE PLAX 2D LVIDd:         6.40 cm LVIDs:         6.30 cm LV PW:         0.60 cm LV IVS:        0.90 cm LVOT diam:     2.00 cm LV SV:         33 LV SV Index:   15 LVOT Area:     3.14 cm  RIGHT VENTRICLE RV S prime:     5.44 cm/s TAPSE (M-mode): 1.4 cm LEFT ATRIUM              Index       RIGHT ATRIUM           Index LA diam:        5.90 cm  2.68 cm/m  RA Area:     19.40 cm LA Vol (A2C):   112.0 ml 50.91 ml/m RA Volume:   53.50 ml  24.32 ml/m LA Vol (A4C):   129.0 ml 58.64 ml/m LA Biplane Vol: 121.0 ml 55.00 ml/m  AORTIC VALVE LVOT Vmax:   66.58 cm/s LVOT Vmean:  46.125 cm/s LVOT VTI:    0.104 m  AORTA Ao Root diam: 3.00 cm TRICUSPID VALVE TR Peak grad:   33.2 mmHg TR Vmax:        288.00 cm/s  SHUNTS Systemic VTI:  0.10 m Systemic Diam: 2.00 cm Donato Schultz MD Electronically signed by Donato Schultz MD Signature Date/Time: 09/09/2019/1:11:22 PM    Final      EKG: see HPI  LBBB monomorphic CT -> PAF now Sinus tachycardia with LBBB  ASSESSMENT AND PLAN:  1. VT:  Change to iv amiodarone continue mexiletine Rebolus for more VT and consider changing to lidocaine. Check Mg K ok.  Device seems to be working appropriately but defib rate likely in 160 range given issues with PAF as well.   2. CHF/COPD:  He has bad COPD on home oxygen and CHF with ischemic DCM EF 40-45% IV lasix given in ER with MSO4  Check ABG. Will ask critical care to see as he may need to be intubated Bipap try to avoid beta agonist with PAF and VT  3. CAD/CABG:  No chest pain grafts fairly recent and negative troponin does not appear to be acute ischemic event ASA  4. PAF:  Hold eliquis cover with heparin with recent [facility] as he may need VT ablation this admission at [facility]    Signed: Theron Arista Nishan9/06/2019, 2:13 PM

## 2019-09-18 NOTE — H&P (Signed)
NAME:  Max Rivas, MRN:  [id number], DOB:  March 27, 1950, LOS: 0 ADMISSION DATE:  09/18/2019, CONSULTATION DATE:  09/18/2019 REFERRING MD:  Dr, Stevie Kern, CHIEF COMPLAINT:  SOB   Brief History   69 yo M, PMH CAD, CABG, COPD, former smoker, presented in respiratory failure, refused BIPAP, chronic systolic HF, EF 40-45%.   History of present illness    69 yo M, PMH CAD, CABG, COPD, former smoker, presented in respiratory failure, refused BIPAP, chronic systolic HF, EF 40-45%. Ischemic cardiomyopathy at baseline. In the ED patient developed monomorphic VT in ED, DCCV X 2. Given amiodarone. Also has PAF on xarelto at baseline. CXR with BL small effusions and elevated BNP of 600s. He finally got to the point of respiratory failure on a non rebreather. We discussed intubation at the patient was agreeable. Tessie Fass, NP, and Newman Pies, MD the EDP.   Past Medical History   Past Medical History:  Diagnosis Date  . Arthritis    "elbows, knees" (02/24/2016)  . Bronchitis 2006  . CAD (coronary artery disease)    a. s/p prior MIs - 1995 x 2, 1998; b. s/p prior LCX stenting; c. 04/2019 Cath: LM min irregs, LAD 65p/mi, D1 75, LCX 99ost/p, 34m (ISR), OM2 80, RCA/RPDA mod diff dzs; d. 05/2019 CABG x 3: LIMA->LAD, VG->Diag, VG->OM.  Marland Kitchen COPD (chronic obstructive pulmonary disease) (HCC)    a. Remote tobacco-->on home O2.  Marland Kitchen GERD (gastroesophageal reflux disease)   . 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 05/2019 Echo: EF 40-45%, gr2 DD. Nl RV size/fxn. Mildly dil LA. Mild MR.  . High cholesterol   . Ischemic cardiomyopathy    a. 05/2019 Echo: EF 40-45%.  . Morbid obesity (HCC)   . Myocardial infarction [facility]) ~ 1995 X 2;1998; 2000; 2004  . On home oxygen therapy    "2L w/activity" (02/24/2016)  . PAF (paroxysmal atrial fibrillation) (HCC)    a. CHA2DS2VASc = 4-->eliquis. Also on amio.  . Pulmonary embolism (HCC) 02/24/2016  . Sleep apnea    "have CPAP; can't tolerate it"  (02/24/2016)  . Ventricular tachycardia (HCC)    a. 2005 s/p ICD; b. 03/2011 Device upgrade/lead exchange: MDT Protecta XT VR single lead AICD.     Significant Hospital Events   9/6: Monomorphic VT X DCCV + Amio  Consults:  Cardiology  PCCM   Procedures:  9/6: DCCV 9/6: ETT  Significant Diagnostic Tests:    Micro Data:  covid negative  Antimicrobials:     Interim history/subjective:  Critically ill. Intubated. Per HPI above   Objective   Blood pressure 124/83, pulse (!) 136, resp. rate (!) 27, height 5\' 11"  (1.803 m), weight 99.8 kg, SpO2 94 %.       No intake or output data in the 24 hours ending 09/18/19 1514 Filed Weights   09/18/19 1236  Weight: 99.8 kg    Examination: General: Elderly male, intubated on life support  HENT: NCAT, sclera clear  Lungs: diminished, little air movement  Cardiovascular: RRR, s1 s2 Abdomen: soft, nt nd  Extremities: no edema, 10 toe onychomycosis  Neuro: alert oriented following commands  GU: deferred   Resolved Hospital Problem list     Assessment & Plan:   AECOPD Acute hypoxemic respiratory failure  Likely has hypercarbic respiratory, no ABG on file, pending  P:  Steroids  Nebs, pulmicort and yupelri  Prn albuterol  I know we want to avoid SABA but this will likely be inevitable  Mag goal >2 Repeat ABg following intubation   Monomorphic VT  Ischemic cardiomyopathy at baseline  Acute on Chronic systolic heart failure  Bilateral effusions  P:  Diuresis  Intubation  Amiodarone Sedate with propofol which will help with reducing VT   Appreciate cardiology input  Consider lidocaine bolus if VT reoccurs   PAF on Xarelto  P: Hold xarelto  Start heparin ggt   CKD baseline  - follow UOP    Best practice:  Diet: NPO Pain/Anxiety/Delirium protocol (if indicated): prop + fent  VAP protocol (if indicated): yes  DVT prophylaxis: Heparin  GI prophylaxis: PPI  Glucose control: CBGs  Mobility: BR Code Status:  FULL Family Communication: team will update family  Disposition: ICU   Labs   CBC: Recent Labs  Lab 09/18/19 1247 09/18/19 1259  WBC 12.3*  --   NEUTROABS 9.3*  --   HGB 15.1 17.0  HCT 48.6 50.0  MCV 91.5  --   PLT 238  --     Basic Metabolic Panel: Recent Labs  Lab 09/18/19 1247 09/18/19 1259  NA 136 136  K 3.7 3.7  CL 96* 97*  CO2 25  --   GLUCOSE 219* 215*  BUN 15 17  CREATININE 1.57* 1.40*  CALCIUM 8.8*  --   MG 2.0  --    GFR: Estimated Creatinine Clearance: 61.6 mL/min (A) (by C-G formula based on SCr of 1.4 mg/dL (H)). Recent Labs  Lab 09/18/19 1247  WBC 12.3*    Liver Function Tests: No results for input(s): AST, ALT, ALKPHOS, BILITOT, PROT, ALBUMIN in the last 168 hours. No results for input(s): LIPASE, AMYLASE in the last 168 hours. No results for input(s): AMMONIA in the last 168 hours.  ABG    Component Value Date/Time   PHART 7.347 (L) 05/24/2019 0957   PCO2ART 48.3 (H) 05/24/2019 0957   PO2ART 113 (H) 05/24/2019 0957   HCO3 26.2 05/24/2019 0957   TCO2 30 09/18/2019 1259   ACIDBASEDEF 3.0 (H) 05/23/2019 1330   O2SAT 60.1 05/27/2019 0423     Coagulation Profile: No results for input(s): INR, PROTIME in the last 168 hours.  Cardiac Enzymes: No results for input(s): CKTOTAL, CKMB, CKMBINDEX, TROPONINI in the last 168 hours.  HbA1C: Hgb A1c MFr Bld  Date/Time Value Ref Range Status  09/10/2019 12:41 AM 6.2 (H) 4.8 - 5.6 % Final    Comment:    (NOTE) Pre diabetes:          5.7%-6.4%  Diabetes:              >6.4%  Glycemic control for   <7.0% adults with diabetes   05/23/2019 02:28 AM 6.3 (H) 4.8 - 5.6 % Final    Comment:    (NOTE) Pre diabetes:          5.7%-6.4% Diabetes:              >6.4% Glycemic control for   <7.0% adults with diabetes     CBG: No results for input(s): GLUCAP in the last 168 hours.  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   Surgical History    Past Surgical History:  Procedure Laterality Date  . CARDIOVERSION N/A 09/11/2019   Procedure: CARDIOVERSION;  Surgeon: Jodelle Red, MD;  Location: [facility] ENDOSCOPY;  Service: Cardiovascular;  Laterality: N/A;  . CLIPPING OF ATRIAL APPENDAGE N/A 05/23/2019   Procedure: CLIPPING OF ATRIAL APPENDAGE with atriclip;  Surgeon: [name], MD;  Location: [facility] OR;  Service: Open Heart Surgery;  Laterality: N/A;  . CORONARY ANGIOPLASTY  1995  . CORONARY ANGIOPLASTY WITH STENT PLACEMENT  ~ 1995 - 2004 X 5   "I've got a total of 5 stents" (02/24/2016)  . CORONARY ARTERY BYPASS GRAFT N/A 05/23/2019   Procedure: CORONARY ARTERY BYPASS GRAFTING (CABG), times three, using right greater saphenous vein and left internal mammary;  Surgeon: [name], MD;  Location: [facility] OR;  Service: Open Heart Surgery;  Laterality: N/A;  [place] only  . 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 INTRAVASCULAR PRESSURE WIRE/FFR STUDY N/A 05/12/2019   Procedure: INTRAVASCULAR PRESSURE WIRE/FFR STUDY;  Surgeon: Lyn Records, MD;  Location: [facility] INVASIVE CV LAB;  Service: Cardiovascular;  Laterality: N/A;  . LEFT HEART CATH AND CORONARY ANGIOGRAPHY N/A 05/12/2019   Procedure: LEFT HEART CATH AND CORONARY ANGIOGRAPHY;  Surgeon: Lyn Records, MD;  Location: [facility] INVASIVE CV LAB;  Service: Cardiovascular;  Laterality: N/A;  . PACEMAKER GENERATOR  CHANGE  03/2011    [address]  . TEE WITHOUT CARDIOVERSION N/A 05/23/2019   Procedure: TRANSESOPHAGEAL ECHOCARDIOGRAM (TEE);  Surgeon: [name], MD;  Location: [facility] OR;  Service: Open Heart Surgery;  Laterality: N/A;  . TONSILLECTOMY       Social History   reports that he quit smoking about 3 months ago. His smoking use included cigarettes. He has a 105.00 pack-year smoking history. He quit smokeless tobacco use about 2 years ago. He reports previous alcohol use. He reports previous drug use. Drug: Cocaine.   Family History   His family history includes Heart attack in his father and mother; Heart attack (age of onset: 57) in his sister.   Allergies Allergies  Allergen Reactions  . Crestor [Rosuvastatin Calcium] Other (See Comments)    Back spasms, but can tolerate it at a low dose now     Home Medications  Prior to Admission medications   Medication Sig Start Date End Date Taking? Authorizing Provider  acetaminophen (TYLENOL) 325 MG tablet Take 2 tablets (650 mg total) by mouth every 6 (six) hours as needed for mild pain (or Fever >/= 101). 03/05/16   Alison Murray, MD  amiodarone (PACERONE) 200 MG tablet Take 1 tablet (200 mg total) by mouth daily. Patient taking differently: Take 200 mg by mouth daily. Patient states taking once a day MD note from 8/20 states increase to bid 06/01/19   Barrett, Erin R, PA-C  apixaban (ELIQUIS) 5 MG TABS tablet Take 5 mg by mouth 2 (two) times daily.    [provider]  aspirin 81 MG chewable tablet Chew 1 tablet (81 mg total) by mouth daily. 05/16/19   Kroeger, Ovidio Kin., PA-C  atorvastatin (LIPITOR) 80 MG tablet Take 1 tablet (80 mg total) by mouth daily. 05/15/19   Kroeger, Ovidio Kin., PA-C  carboxymethylcellulose (REFRESH PLUS) 0.5 % SOLN Place 1 drop into both eyes at bedtime.    [provider]  Cholecalciferol (  VITAMIN D3) 50 MCG (2000 UT) TABS Take 2,000 Units by mouth daily.    [provider]   dextromethorphan-guaiFENesin (MUCINEX DM) 30-600 MG 12hr tablet Take 1 tablet by mouth 2 (two) times daily as needed for cough. Patient not taking: Reported on 09/08/2019 06/01/19   Barrett, Rae Roam, PA-C  furosemide (LASIX) 40 MG tablet Take 40-80 mg by mouth 2 (two) times daily. Take 2 tablets (80 mg) in the morning and Take 1 tablet (40 mg) at bedtime    [provider]  gabapentin (NEURONTIN) 100 MG capsule Take 2 capsules (200 mg total) by mouth 2 (two) times daily. 06/01/19   Barrett, Erin R, PA-C  levalbuterol (XOPENEX HFA) 45 MCG/ACT inhaler Inhale 2 puffs into the lungs every 6 (six) hours as needed for wheezing or shortness of breath.    [provider]  levothyroxine (SYNTHROID) 75 MCG tablet Take 75 mcg by mouth daily before breakfast.    [provider]  metoprolol succinate (TOPROL-XL) 25 MG 24 hr tablet Take 12.5 mg by mouth daily.    [provider]  mexiletine (MEXITIL) 150 MG capsule Take 150 mg by mouth 2 (two) times daily.    [provider]  mometasone [facility]) 220 MCG/INH inhaler Inhale 2 puffs into the lungs 2 (two) times daily.     [provider]  montelukast (SINGULAIR) 10 MG tablet Take 10 mg by mouth daily.     [provider]  Multiple Vitamin (MULTIVITAMIN WITH MINERALS) TABS tablet Take 1 tablet by mouth daily.    [provider]  nitroGLYCERIN (NITROSTAT) 0.4 MG SL tablet Place 0.4 mg under the tongue every 5 (five) minutes as needed for chest pain.     [provider]  omega-3 acid ethyl esters (LOVAZA) 1 g capsule Take 1 capsule (1 g total) by mouth 2 (two) times daily. 03/05/16   Alison Murray, MD  omeprazole (PRILOSEC) 20 MG capsule Take 20 mg by mouth 2 (two) times daily before a meal.    [provider]  Propylene Glycol (SYSTANE BALANCE) 0.6 % SOLN Place 1 drop into both eyes See admin instructions. Place 1 drop into each eye four times a day and apply a warm compress for 10  minutes afterwards    [provider]  Tiotropium Bromide-Olodaterol (STIOLTO RESPIMAT) 2.5-2.5 MCG/ACT AERS Inhale 2 puffs into the lungs daily.     [provider]     This patient is critically ill with multiple organ system failure; which, requires frequent high complexity decision making, assessment, support, evaluation, and titration of therapies. This was completed through the application of advanced monitoring technologies and extensive interpretation of multiple databases. During this encounter critical care time was devoted to patient care services described in this note for 32 minutes.  Josephine Igo, DO [facility] 09/18/2019 3:15 PM

## 2019-09-18 NOTE — ED Provider Notes (Signed)
[name] EMERGENCY DEPARTMENT Provider Note   CSN: [id number] Arrival date & time: 09/18/19  1227     History Chief Complaint  Patient presents with  . Max Rivas is a 69 y.o. male.  Presents to ER with shortness of breath.  Patient reports that he had sudden onset of shortness of breath, palpitations, his internal defibrillator shocked once at home.  EMS reports patient was in V. tach, given amiodarone and converted to A. fib, additional amiodarone given and no change noted.  Patient states that he does not have any active chest pain or chest tightness.  Feels very short of breath.  No cough or fevers.  Level 5 caveat History is limited due to acuity.  HPI     Past Medical History:  Diagnosis Date  . Arthritis    "elbows, knees" (02/24/2016)  . Bronchitis 2006  . CAD (coronary artery disease)    a. s/p prior MIs - 1995 x 2, 1998; b. s/p prior LCX stenting; c. 04/2019 Cath: LM min irregs, LAD 65p/mi, D1 75, LCX 99ost/p, 31m (ISR), OM2 80, RCA/RPDA mod diff dzs; d. 05/2019 CABG x 3: LIMA->LAD, VG->Diag, VG->OM.  Marland Kitchen COPD (chronic obstructive pulmonary disease) (HCC)    a. Remote tobacco-->on home O2.  Marland Kitchen GERD (gastroesophageal reflux disease)   . 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 05/2019 Echo: EF 40-45%, gr2 DD. Nl RV size/fxn. Mildly dil LA. Mild MR.  . High cholesterol   . Ischemic cardiomyopathy    a. 05/2019 Echo: EF 40-45%.  . Morbid obesity (HCC)   . Myocardial infarction [facility]) ~ 1995 X 2;1998; 2000; 2004  . On home oxygen therapy    "2L w/activity" (02/24/2016)  . PAF (paroxysmal atrial fibrillation) (HCC)    a. CHA2DS2VASc = 4-->eliquis. Also on amio.  . Pulmonary embolism (HCC) 02/24/2016  . Sleep apnea    "have CPAP; can't tolerate it" (02/24/2016)  . Ventricular tachycardia (HCC)    a. 2005 s/p ICD; b. 03/2011 Device upgrade/lead exchange: MDT Protecta XT VR single lead AICD.    Patient Active Problem  List   Diagnosis Date Noted  . Acute on chronic respiratory failure with hypoxia (HCC) 09/18/2019  . Acute exacerbation of congestive heart failure (HCC) 09/08/2019  . Unstable angina (HCC) 05/21/2019  . Ventricular tachycardia (HCC) 05/12/2019  . Chest pain   . VT (ventricular tachycardia) (HCC) 05/11/2019  . Arrhythmia 03/12/2019  . On home O2   . Chronic combined systolic and diastolic CHF (congestive heart failure) (HCC) 06/22/2018  . Chronic respiratory failure with hypoxia (HCC) 06/22/2018  . Congestive heart failure (CHF) (HCC) 06/22/2018  . DCM (dilated cardiomyopathy) (HCC) 05/25/2017  . Coronary artery disease 05/25/2017  . Chronic atrial fibrillation 05/25/2017  . COPD (chronic obstructive pulmonary disease) (HCC) 05/25/2017  . CKD (chronic kidney disease) 05/25/2017  . Hypothyroidism 05/25/2017  . 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 OSA (obstructive sleep apnea) 05/25/2017  . Pulmonary HTN (HCC) 05/25/2017  . Mediastinal adenopathy   . Cervical adenopathy   . Acute respiratory failure with hypoxia (HCC) 02/24/2016  . Pulmonary embolus (HCC) 02/24/2016  . Pulmonary artery thrombosis (HCC)   . Pleural effusion   . Lung mass   . Pleural plaque   . Flutter-fibrillation (HCC) 01/15/2016  . Acute systolic congestive heart failure (HCC)   . Atrial fibrillation with RVR (HCC)   . Hypercholesterolemia 03/11/2006  . Tobacco abuse 03/11/2006  .  Acute on chronic systolic congestive heart failure (HCC) 03/11/2006  . GASTROESOPHAGEAL REFLUX, NO ESOPHAGITIS 03/11/2006  . OSTEOARTHRITIS, MULTI SITES 03/11/2006  . HIGH RISK PATIENT 03/11/2006  . Tobacco dependence 03/11/2006    Past Surgical History:  Procedure Laterality Date  . CARDIOVERSION N/A 09/11/2019   Procedure: CARDIOVERSION;  Surgeon: Jodelle Red, MD;  Location: [facility] ENDOSCOPY;  Service: Cardiovascular;  Laterality: N/A;  . CLIPPING OF ATRIAL APPENDAGE N/A 05/23/2019    Procedure: CLIPPING OF ATRIAL APPENDAGE with atriclip;  Surgeon: [name], MD;  Location[facility] OR;  Service: Open Heart Surgery;  Laterality: N/A;  . CORONARY ANGIOPLASTY  1995  . CORONARY ANGIOPLASTY WITH STENT PLACEMENT  ~ 1995 - 2004 X 5   "I've got a total of 5 stents" (02/24/2016)  . CORONARY ARTERY BYPASS GRAFT N/A 05/23/2019   Procedure: CORONARY ARTERY BYPASS GRAFTING (CABG), times three, using right greater saphenous vein and left internal mammary;  Surgeon: [name], MD;  Location: [facility] OR;  Service: Open Heart Surgery;  Laterality: N/A;  [place] only  . 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 INTRAVASCULAR PRESSURE WIRE/FFR STUDY N/A 05/12/2019   Procedure: INTRAVASCULAR PRESSURE WIRE/FFR STUDY;  Surgeon: Lyn Records, MD;  Location: [facility] INVASIVE CV LAB;  Service: Cardiovascular;  Laterality: N/A;  . LEFT HEART CATH AND CORONARY ANGIOGRAPHY N/A 05/12/2019   Procedure: LEFT HEART CATH AND CORONARY ANGIOGRAPHY;  Surgeon: Lyn Records, MD;  Location: [facility] INVASIVE CV LAB;  Service: Cardiovascular;  Laterality: N/A;  . PACEMAKER [place] CH[place]  03/2011    [facility] [place]  . TEE WITHOUT CARDIOVERSION N/A 05/23/2019   Procedure: TRANSESOPHAGEAL ECHOCARDIOGRAM (TEE);  Surgeon: [name], MD;  Location[facility] OR;  Service: Open Heart Surgery;  Laterality: N/A;  . TONSILLECTOMY         Family History  Problem Relation Age of Onset  . Heart attack Mother   . Heart attack Father   . Heart attack Sister 51    Social History   Tobacco Use  . Smoking status: Former Smoker    Packs/day: 3.00    Years: 35.00    Pack years: 105.00    Types: Cigarettes    Quit date: 05/22/2019    Years since quitting: 0.3  . Smokeless tobacco: Former Neurosurgeon    Quit date: 05/21/2017  Vaping Use  . Vaping Use: Never used  Substance Use Topics  . Alcohol use: Not Currently  . Drug use: Not Currently    Types: Cocaine    Comment: none since  1995    Home Medications Prior to Admission medications   Medication Sig Start Date End Date Taking? Authorizing Provider  acetaminophen (TYLENOL) 325 MG tablet Take 2 tablets (650 mg total) by mouth every 6 (six) hours as needed for mild pain (or Fever >/= 101). 03/05/16   Alison Murray, MD  amiodarone (PACERONE) 200 MG tablet Take 1 tablet (200 mg total) by mouth daily. Patient taking differently: Take 200 mg by mouth daily. Patient states taking once a day MD note from 8/20 states increase to bid 06/01/19   Barrett, Erin R, PA-C  apixaban (ELIQUIS) 5 MG TABS tablet Take 5 mg by mouth 2 (two) times daily.    [provider]  aspirin 81 MG chewable tablet Chew 1 tablet (81 mg total) by mouth daily. 05/16/19   Kroeger, Ovidio Kin., PA-C  atorvastatin (LIPITOR) 80 MG  tablet Take 1 tablet (80 mg total) by mouth daily. 05/15/19   Kroeger, Ovidio Kin., PA-C  carboxymethylcellulose (REFRESH PLUS) 0.5 % SOLN Place 1 drop into both eyes at bedtime.    [provider]  Cholecalciferol (VITAMIN D3) 50 MCG (2000 UT) TABS Take 2,000 Units by mouth daily.    [provider]  dextromethorphan-guaiFENesin (MUCINEX DM) 30-600 MG 12hr tablet Take 1 tablet by mouth 2 (two) times daily as needed for cough. Patient not taking: Reported on 09/08/2019 06/01/19   Barrett, Rae Roam, PA-C  furosemide (LASIX) 40 MG tablet Take 40-80 mg by mouth 2 (two) times daily. Take 2 tablets (80 mg) in the morning and Take 1 tablet (40 mg) at bedtime    [provider]  gabapentin (NEURONTIN) 100 MG capsule Take 2 capsules (200 mg total) by mouth 2 (two) times daily. 06/01/19   Barrett, Erin R, PA-C  levalbuterol (XOPENEX HFA) 45 MCG/ACT inhaler Inhale 2 puffs into the lungs every 6 (six) hours as needed for wheezing or shortness of breath.    [provider]  levothyroxine (SYNTHROID) 75 MCG tablet Take 75 mcg by mouth daily before breakfast.    [provider]  metoprolol succinate  (TOPROL-XL) 25 MG 24 hr tablet Take 12.5 mg by mouth daily.    [provider]  mexiletine (MEXITIL) 150 MG capsule Take 150 mg by mouth 2 (two) times daily.    [provider]  mometasone [facility]) 220 MCG/INH inhaler Inhale 2 puffs into the lungs 2 (two) times daily.     [provider]  montelukast (SINGULAIR) 10 MG tablet Take 10 mg by mouth daily.     [provider]  Multiple Vitamin (MULTIVITAMIN WITH MINERALS) TABS tablet Take 1 tablet by mouth daily.    [provider]  nitroGLYCERIN (NITROSTAT) 0.4 MG SL tablet Place 0.4 mg under the tongue every 5 (five) minutes as needed for chest pain.     [provider]  omega-3 acid ethyl esters (LOVAZA) 1 g capsule Take 1 capsule (1 g total) by mouth 2 (two) times daily. 03/05/16   Alison Murray, MD  omeprazole (PRILOSEC) 20 MG capsule Take 20 mg by mouth 2 (two) times daily before a meal.    [provider]  Propylene Glycol (SYSTANE BALANCE) 0.6 % SOLN Place 1 drop into both eyes See admin instructions. Place 1 drop into each eye four times a day and apply a warm compress for 10 minutes afterwards    [provider]  Tiotropium Bromide-Olodaterol (STIOLTO RESPIMAT) 2.5-2.5 MCG/ACT AERS Inhale 2 puffs into the lungs daily.     [provider]    Allergies    Crestor [rosuvastatin calcium]  Review of Systems   Review of Systems  Unable to perform ROS: Acuity of condition  Respiratory: Positive for shortness of breath.   Cardiovascular: Positive for palpitations.    Physical Exam Updated Vital Signs BP (!) 84/54 (BP Location: Left Arm)   Pulse 94   Resp 18   Ht  (1.803 m)   Wt 99.8 kg   SpO2 96%   BMI 30.68 kg/m   Physical Exam Vitals and nursing note reviewed.  Constitutional:      Comments: Moderate respiratory distress  HENT:     Head: Normocephalic and atraumatic.     Nose: Nose normal.     Mouth/Throat:     Mouth: Mucous membranes are  moist.  Eyes:     Extraocular Movements:  Extraocular movements intact.     Pupils: Pupils are equal, round, and reactive to light.  Cardiovascular:     Rate and Rhythm: Regular rhythm. Tachycardia present.     Pulses: Normal pulses.     Heart sounds: Normal heart sounds.  Pulmonary:     Comments: Bilateral crackles noted at bases, tachypnea, increased work of breathing with accessory muscle use Abdominal:     General: Abdomen is flat.     Palpations: Abdomen is soft.  Musculoskeletal:        General: No deformity.     Cervical back: Normal range of motion.     Right lower leg: No edema.     Left lower leg: No edema.  Skin:    Findings: No bruising or erythema.  Neurological:     Mental Status: He is alert.     Comments: Somewhat fatigued but readily arousable, answers most questions appropriately     ED Results / Procedures / Treatments   Labs (all labs ordered are listed, but only abnormal results are displayed) Labs Reviewed  CBC WITH DIFFERENTIAL/PLATELET - Abnormal; Notable for the following components:      Result Value   WBC 12.3 (*)    RDW 16.5 (*)    Neutro Abs 9.3 (*)    Monocytes Absolute 1.2 (*)    All other components within normal limits  BASIC METABOLIC PANEL - Abnormal; Notable for the following components:   Chloride 96 (*)    Glucose, Bld 219 (*)    Creatinine, Ser 1.57 (*)    Calcium 8.8 (*)    GFR calc non Af Amer 45 (*)    GFR calc Af Amer 52 (*)    All other components within normal limits  BRAIN NATRIURETIC PEPTIDE - Abnormal; Notable for the following components:   B Natriuretic Peptide 601.0 (*)    All other components within normal limits  I-STAT CHEM 8, ED - Abnormal; Notable for the following components:   Chloride 97 (*)    Creatinine, Ser 1.40 (*)    Glucose, Bld 215 (*)    Calcium, Ion 1.06 (*)    All other components within normal limits  SARS CORONAVIRUS 2 BY RT PCR (HOSPITAL ORDER, PERFORMED IN [facility])   MAGNESIUM  BLOOD GAS, ARTERIAL  APTT  TYPE AND SCREEN  TROPONIN I (HIGH SENSITIVITY)  TROPONIN I (HIGH SENSITIVITY)    EKG EKG Interpretation  Date/Time:  Monday September 18 2019 12:36:41 EDT Ventricular Rate:  142 PR Interval:    QRS Duration: 173 QT Interval:  367 QTC Calculation: 565 R Axis:   -92 Text Interpretation: indeterminate rhythm - wide complex tachycardia, suspect afib with abberancy or Vtach Nonspecific IVCD with LAD Consider anterolateral infarct Confirmed by Marianna Fuss ([id number]) on 09/18/2019 12:51:56 PM   Radiology DG Chest Portable 1 View  Result Date: 09/18/2019 CLINICAL DATA:  Ventricular tachycardia. EXAM: PORTABLE CHEST 1 VIEW COMPARISON:  September 08, 2019 FINDINGS: Cardiac pacemaker in stable position. Cardiomediastinal silhouette is stable. Bilateral pleural effusions. Increased interstitial markings superimposed on pre-existing pleural subpleural scarring. Osseous structures are without acute abnormality. Soft tissues are grossly normal. IMPRESSION: 1. Bilateral pleural effusions. 2. Increased interstitial markings superimposed on pre-existing pleural subpleural scarring, likely represent interstitial pulmonary edema. Electronically Signed   By: Ted Mcalpine M.D.   On: 09/18/2019 13:22    Procedures .Critical Care Performed by: Milagros Loll, MD Authorized by: Milagros Loll, MD   Critical care provider statement:  Critical care time (minutes):  48   Critical care was necessary to treat or prevent imminent or life-threatening deterioration of the following conditions:  Respiratory failure and cardiac failure   Critical care was time spent personally by me on the following activities:  Discussions with consultants, evaluation of patient's response to treatment, examination of patient, ordering and performing treatments and interventions, ordering and review of laboratory studies, ordering and review of radiographic studies, pulse  oximetry, re-evaluation of patient's condition, obtaining history from patient or surrogate and review of old charts .Cardioversion  Date/Time: 09/18/2019 3:29 PM Performed by: Milagros Loll, MD Authorized by: Milagros Loll, MD   Consent:    Consent obtained:  Written   Consent given by:  Patient   Risks discussed:  Cutaneous burn, death, induced arrhythmia and pain   Alternatives discussed:  No treatment, rate-control medication, alternative treatment, referral and delayed treatment Pre-procedure details:    Cardioversion basis:  Emergent   Rhythm:  Ventricular tachycardia   Electrode placement:  Anterior-posterior Patient sedated: Yes. Refer to sedation procedure documentation for details of sedation.  Attempt one:    Cardioversion mode:  Synchronous   Waveform:  Biphasic   Shock (Joules):  120   Shock outcome:  No change in rhythm Attempt two:    Cardioversion mode:  Synchronous   Waveform:  Biphasic   Shock (Joules):  200   Shock outcome:  Conversion to normal sinus rhythm Post-procedure details:    Patient status:  Awake   Patient tolerance of procedure:  Tolerated well, no immediate complications Comments:     Required cardioversion attempt x 2 with successful conversion to NSR   .Sedation  Date/Time: 09/18/2019 3:30 PM Performed by: Milagros Loll, MD Authorized by: Milagros Loll, MD   Consent:    Consent obtained:  Written   Consent given by:  Patient   Risks discussed:  Allergic reaction, dysrhythmia, inadequate sedation, nausea, vomiting, respiratory compromise necessitating ventilatory assistance and intubation, prolonged hypoxia resulting in organ damage and prolonged sedation necessitating reversal   Alternatives discussed:  Analgesia without sedation and anxiolysis Universal protocol:    Immediately prior to procedure a time out was called: yes   Indications:    Procedure performed:  Cardioversion Pre-sedation assessment:    Time since  last food or drink:  > 6 hours   ASA classification: class 3 - patient with severe systemic disease     Neck mobility: normal     Mouth opening:  3 or more finger widths   Mallampati score:  I - soft palate, uvula, fauces, pillars visible   Pre-sedation assessments completed and reviewed: airway patency, cardiovascular function, hydration status, mental status, nausea/vomiting, pain level, respiratory function and temperature   Immediate pre-procedure details:    Reviewed: vital signs, relevant labs/tests and NPO status     Verified: bag valve mask available, emergency equipment available, intubation equipment available, IV patency confirmed, oxygen available, reversal medications available and suction available   Procedure details (see MAR for exact dosages):    Preoxygenation:  Nonrebreather mask   Sedation:  Etomidate   Intended level of sedation: deep   Analgesia:  None   Intra-procedure monitoring:  Cardiac monitor, blood pressure monitoring, continuous capnometry, continuous pulse oximetry, frequent vital sign checks and frequent LOC assessments   Intra-procedure events: none     Total Provider sedation time (minutes):  15 Post-procedure details:    Attendance: Constant attendance by certified staff until patient recovered  Post-sedation assessments completed and reviewed: airway patency, cardiovascular function, hydration status, mental status, nausea/vomiting, pain level, respiratory function and temperature     Patient tolerance:  Tolerated well, no immediate complications   (including critical care time)   Medications Ordered in ED Medications  amiodarone (NEXTERONE PREMIX) 360-4.14 MG/200ML-% (1.8 mg/mL) IV infusion (60 mg/hr Intravenous New Bag/Given 09/18/19 1314)    Followed by  amiodarone (NEXTERONE PREMIX) 360-4.14 MG/200ML-% (1.8 mg/mL) IV infusion (has no administration in time range)  potassium chloride 10 mEq in 100 mL IVPB (10 mEq Intravenous New Bag/Given 09/18/19  1526)  lidocaine (XYLOCAINE) 4 mg/mL bolus via infusion 50 mg (has no administration in time range)    And  lidocaine (cardiac) 2000 mg in dextrose 5% 500 mL ( /mL) IV infusion (has no administration in time range)  fentaNYL (SUBLIMAZE) injection 100 mcg (has no administration in time range)  0.9 %  sodium chloride infusion (250 mLs Intravenous New Bag/Given 09/18/19 1533)  norepinephrine (LEVOPHED)  in premix infusion (5 mcg/min Intravenous Rate/Dose Verify 09/18/19 1531)  fentaNYL in NS (45mcg/ml) infusion-PREMIX (25 mcg/hr Intravenous New Bag/Given 09/18/19 1511)  fentaNYL (SUBLIMAZE) bolus via infusion 25 mcg (has no administration in time range)  propofol (DIPRIVAN) 1000 MG/100ML infusion (has no administration in time range)  pantoprazole (PROTONIX) injection 40 mg (has no administration in time range)  lactated ringers infusion (has no administration in time range)  acetaminophen (TYLENOL) tablet 650 mg (has no administration in time range)  docusate sodium (COLACE) capsule 100 mg (has no administration in time range)  polyethylene glycol (MIRALAX / GLYCOLAX) packet 17 g (has no administration in time range)  ondansetron (ZOFRAN) injection 4 mg (has no administration in time range)  budesonide (PULMICORT) nebulizer solution 0.25 mg (has no administration in time range)  revefenacin (YUPELRI) nebulizer solution 175 mcg (has no administration in time range)  methylPREDNISolone sodium succinate (SOLU-MEDROL) 125 mg/2 mL injection 60 mg (has no administration in time range)  albuterol (PROVENTIL) (2.5 MG/3ML) 0.083% nebulizer solution 2.5 mg (has no administration in time range)  heparin ADULT infusion 100 units/mL (25000 units/254mL sodium chloride 0.45%) (has no administration in time range)  amiodarone (NEXTERONE) 1.8 mg/mL load via infusion 150 mg (150 mg Intravenous Bolus from Bag 09/18/19 1254)  etomidate (AMIDATE) injection (10 mg Intravenous Given 09/18/19 1306)   furosemide (LASIX) injection 60 mg (60 mg Intravenous Given 09/18/19 1318)  magnesium sulfate IVPB 2 g 50 mL (0 g Intravenous Stopped 09/18/19 1517)  furosemide (LASIX) injection 60 mg (60 mg Intravenous Given 09/18/19 1522)  methylPREDNISolone sodium succinate (SOLU-MEDROL) 125 mg/2 mL injection 125 mg (125 mg Intravenous Given 09/18/19 1454)  midazolam (VERSED) injection ( Intravenous Canceled Entry 09/18/19 1500)  fentaNYL (SUBLIMAZE) injection ( Intravenous Canceled Entry 09/18/19 1500)  etomidate (AMIDATE) injection (20 mg Intravenous Given 09/18/19 1459)  rocuronium (ZEMURON) injection (4,990 mg Intravenous Given 09/18/19 1459)  0.9 %  sodium chloride infusion (10 mL/hr Intravenous New Bag/Given 09/18/19 1500)    ED Course  I have reviewed the triage vital signs and the nursing notes.  Pertinent labs & imaging results that were available during my care of the patient were reviewed by me and considered in my medical decision making (see chart for details).    MDM Rules/Calculators/A&P                          69 year old male with history of heart failure, A. fib, V. tach presents to ER  with concern for difficulty breathing, palpitations.  Prior to arrival EMS reported ventricular tachycardia that responded to amiodarone but patient went back into V. tach.  On arrival in ER, initial EKG obtained demonstrates wide-complex tachycardia concerning for V. tach or A. fib with aberrancy.  Patient reports he is anticoagulated.  Provided dose of amiodarone and started amnio drip, no improvement in rhythm or rate.  Proceeded with cardioversion.  On second attempt, successfully cardioverted to a normal sinus rhythm.  Patient later went back into the prior rhythm but spontaneously converted back to sinus.  Regarding shortness of breath, noted significant increased work of breathing, chest x-ray with significant pulmonary edema.  I suspect most likely heart failure exacerbation but patient also has history of COPD.   Consulted cardiology, Eden Emms came to bedside, they agreed with continuing amnio drip, recommended using lidocaine if needed for recurrent arrhythmia, okay with steroids to treat COPD as this may also contribute to current picture but recommending avoiding beta agonists. I recommended patient try bipap but he was adamant that he would not be able to tolerate and refused attempt with bipap. Given his work of breathing, unable to utilize bipap, I recommended proceed with intubation. Patient agreeable. Critical care came to bedside at this time and assumed care. Dr. Tonia Brooms performed RSI and will admit to his service.    Final Clinical Impression(s) / ED Diagnoses Final diagnoses:  V-tach (HCC)  Acute respiratory failure with hypoxia (HCC)  Acute on chronic heart failure, unspecified heart failure type [facility])    Rx / DC Orders ED Discharge Orders    None       Milagros Loll, MD 09/18/19 1538

## 2019-09-18 NOTE — Progress Notes (Addendum)
Seen at bedside in ED after speaking with Dr. Eden Emms cardiology with concerns for worsening respiratory distress.  SpO2 90-92% on NRB on my evaluation Features of AECOPD, heart failure, + pleural effusions Pt refusing BiPAP. I discussed extensively w patient who elects for placement of ETT in lieu of BiPAP (adamantly refusing BiPAP)  Will give IV steroids, lasix. Anticipate intubation will likely be needed. D/w PCCM attending and EM attending.   Full consult note to follow  Tessie Fass MSN, AGACNP-BC [facility] Pulmonary/Critical Care Medicine [phone] If no answer, [phone] 09/18/2019, 2:46 PM

## 2019-09-18 NOTE — Procedures (Signed)
Intubation Procedure Note  Max Rivas  [id number]  November 09, 1950  Date:09/18/19  Time:3:13 PM   Provider Performing:Kunio Cummiskey E Adeja Sarratt    Procedure: Intubation (31500)  Indication(s) Respiratory Failure  Consent Risks of the procedure as well as the alternatives and risks of each were explained to the patient and/or caregiver.  Consent for the procedure was obtained and is signed in the bedside chart   Anesthesia Etomidate, Versed, Fentanyl and Rocuronium   Time Out Verified patient identification, verified procedure, site/side was marked, verified correct patient position, special equipment/implants available, medications/allergies/relevant history reviewed, required imaging and test results available.   Sterile Technique Usual hand hygeine, masks, and gloves were used   Procedure Description Patient positioned in bed supine.  Sedation given as noted above.  Patient was intubated with endotracheal tube using Glidescope.  View was Grade 1 full glottis .  Number of attempts was 1.  Colorimetric CO2 detector was consistent with tracheal placement. PCCM DO present throughout.   Complications/Tolerance None; patient tolerated the procedure well. Chest X-ray is ordered to verify placement.   EBL none   Specimen(s) None   Tessie Fass MSN, AGACNP-BC [facility] [phone] If no answer, [phone] 09/18/2019, 3:14 PM

## 2019-09-18 NOTE — Progress Notes (Signed)
ANTICOAGULATION CONSULT NOTE - Initial Consult  Pharmacy Consult for heparin Indication: atrial fibrillation  Allergies  Allergen Reactions  . Crestor [Rosuvastatin Calcium] Other (See Comments)    Back spasms, but can tolerate it at a low dose now    Patient Measurements: Height: 5\' 11"  (180.3 cm) Weight: 99.8 kg (220 lb) IBW/kg (Calculated) : 75.3 Heparin Dosing Weight: 95.8kg  Vital Signs: BP: 84/54 (09/06 1515) Pulse Rate: 94 (09/06 1515)  Labs: Recent Labs    09/18/19 1247 09/18/19 1259  HGB 15.1 17.0  HCT 48.6 50.0  PLT 238  --   CREATININE 1.57* 1.40*  TROPONINIHS 15  --     Estimated Creatinine Clearance: 61.6 mL/min (A) (by C-G formula based on SCr of 1.4 mg/dL (H)).   Medical History: Past Medical History:  Diagnosis Date  . Arthritis    "elbows, knees" (02/24/2016)  . Bronchitis 2006  . CAD (coronary artery disease)    a. s/p prior MIs - 1995 x 2, 1998; b. s/p prior LCX stenting; c. 04/2019 Cath: LM min irregs, LAD 65p/mi, D1 75, LCX 99ost/p, 20m (ISR), OM2 80, RCA/RPDA mod diff dzs; d. 05/2019 CABG x 3: LIMA->LAD, VG->Diag, VG->OM.  06/2019 COPD (chronic obstructive pulmonary disease) (HCC)    a. Remote tobacco-->on home O2.  Marland Kitchen GERD (gastroesophageal reflux disease)   . 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 05/2019 Echo: EF 40-45%, gr2 DD. Nl RV size/fxn. Mildly dil LA. Mild MR.  . High cholesterol   . Ischemic cardiomyopathy    a. 05/2019 Echo: EF 40-45%.  . Morbid obesity (HCC)   . Myocardial infarction [facility]) ~ 1995 X 2;1998; 2000; 2004  . On home oxygen therapy    "2L w/activity" (02/24/2016)  . PAF (paroxysmal atrial fibrillation) (HCC)    a. CHA2DS2VASc = 4-->eliquis. Also on amio.  . Pulmonary embolism (HCC) 02/24/2016  . Sleep apnea    "have CPAP; can't tolerate it" (02/24/2016)  . Ventricular tachycardia (HCC)    a. 2005 s/p ICD; b. 03/2011 Device upgrade/lead exchange: MDT Protecta XT VR single lead AICD.     Medications:  (Not in a hospital admission)   Assessment: 62 YOM admitted with increasing dyspnea. On apixaban at home for h/o Afib. Per patient's roommate, he took his dose of apixaban this AM.   H/H and Plt wnl. SCr 1.4   Goal of Therapy:  Stroke prevention Monitor platelets by anticoagulation protocol: Yes   Plan:  -Start IV heparin infusion at 1400 units/hr. No bolus -F/u 6 hr aPTT -Monitor daily aPTT, HL and s/s of bleeding   81, PharmD., BCPS, BCCCP Clinical Pharmacist Clinical phone for 09/18/19 until 3:30pm: [phone] If after 3:30pm, please refer to [facility] for unit-specific pharmacist

## 2019-09-18 NOTE — Progress Notes (Signed)
Pt transported from ED 34 to 2H01 without incident.

## 2019-09-19 ENCOUNTER — Inpatient Hospital Stay (HOSPITAL_COMMUNITY): Payer: No Typology Code available for payment source

## 2019-09-19 DIAGNOSIS — J90 Pleural effusion, not elsewhere classified: Secondary | ICD-10-CM

## 2019-09-19 LAB — BASIC METABOLIC PANEL
Anion gap: 12 (ref 5–15)
BUN: 23 mg/dL (ref 8–23)
CO2: 25 mmol/L (ref 22–32)
Calcium: 8.8 mg/dL — ABNORMAL LOW (ref 8.9–10.3)
Chloride: 99 mmol/L (ref 98–111)
Creatinine, Ser: 2.03 mg/dL — ABNORMAL HIGH (ref 0.61–1.24)
GFR calc Af Amer: 38 mL/min — ABNORMAL LOW (ref >60)
GFR calc non Af Amer: 33 mL/min — ABNORMAL LOW (ref >60)
Glucose, Bld: 159 mg/dL — ABNORMAL HIGH (ref 70–99)
Potassium: 4.3 mmol/L (ref 3.5–5.1)
Sodium: 136 mmol/L (ref 135–145)

## 2019-09-19 LAB — BODY FLUID CELL COUNT WITH DIFFERENTIAL
Eos, Fluid: 0 %
Lymphs, Fluid: 71 %
Monocyte-Macrophage-Serous Fluid: 22 % — ABNORMAL LOW (ref 50–90)
Neutrophil Count, Fluid: 7 % (ref 0–25)
Total Nucleated Cell Count, Fluid: 405 cu mm (ref 0–1000)

## 2019-09-19 LAB — PHOSPHORUS
Phosphorus: 4.4 mg/dL (ref 2.5–4.6)
Phosphorus: 5.5 mg/dL — ABNORMAL HIGH (ref 2.5–4.6)
Phosphorus: 4.6 mg/dL (ref 2.5–4.6)

## 2019-09-19 LAB — PROTEIN, TOTAL: Total Protein: 6.7 g/dL (ref 6.5–8.1)

## 2019-09-19 LAB — TRIGLYCERIDES: Triglycerides: 38 mg/dL (ref <150)

## 2019-09-19 LAB — LACTATE DEHYDROGENASE: LDH: 909 U/L — ABNORMAL HIGH (ref 98–192)

## 2019-09-19 LAB — MAGNESIUM
Magnesium: 2.4 mg/dL (ref 1.7–2.4)
Magnesium: 2.3 mg/dL (ref 1.7–2.4)
Magnesium: 2.4 mg/dL (ref 1.7–2.4)

## 2019-09-19 LAB — CBC
HCT: 44.3 % (ref 39.0–52.0)
Hemoglobin: 14.2 g/dL (ref 13.0–17.0)
MCH: 28.9 pg (ref 26.0–34.0)
MCHC: 32.1 g/dL (ref 30.0–36.0)
MCV: 90 fL (ref 80.0–100.0)
Platelets: 204 10*3/uL (ref 150–400)
RBC: 4.92 MIL/uL (ref 4.22–5.81)
RDW: 16.4 % — ABNORMAL HIGH (ref 11.5–15.5)
WBC: 7.8 10*3/uL (ref 4.0–10.5)
nRBC: 0 % (ref 0.0–0.2)

## 2019-09-19 LAB — LACTATE DEHYDROGENASE, PLEURAL OR PERITONEAL FLUID: LD, Fluid: 78 U/L — ABNORMAL HIGH (ref 3–23)

## 2019-09-19 LAB — APTT
aPTT: 140 seconds — ABNORMAL HIGH (ref 24–36)
aPTT: 154 seconds — ABNORMAL HIGH (ref 24–36)
aPTT: 88 seconds — ABNORMAL HIGH (ref 24–36)

## 2019-09-19 LAB — GLUCOSE, CAPILLARY: Glucose-Capillary: 142 mg/dL — ABNORMAL HIGH (ref 70–99)

## 2019-09-19 LAB — PROTEIN, PLEURAL OR PERITONEAL FLUID: Total protein, fluid: <3 g/dL

## 2019-09-19 LAB — HEPARIN LEVEL (UNFRACTIONATED): Heparin Unfractionated: >2.2 IU/mL — ABNORMAL HIGH (ref 0.30–0.70)

## 2019-09-19 MED ORDER — VITAL HIGH PROTEIN PO LIQD: 1000.0000 mL | ORAL | Status: DC

## 2019-09-19 MED ORDER — PROSOURCE TF PO LIQD
45.0000 mL | Freq: Every day | ORAL | Status: DC
  Filled 2019-09-19 (×2): qty 45

## 2019-09-19 MED ORDER — BUDESONIDE 0.5 MG/2ML IN SUSP
0.5000 mg | Freq: Two times a day (BID) | RESPIRATORY_TRACT | Status: DC
  Administered 2019-09-19 – 2019-09-22 (×7): 0.5 mg via RESPIRATORY_TRACT
  Filled 2019-09-19 (×7): qty 2

## 2019-09-19 MED ORDER — ATORVASTATIN CALCIUM 80 MG PO TABS
80.0000 mg | ORAL_TABLET | Freq: Every day | ORAL | Status: DC
  Administered 2019-09-19: 80 mg
  Filled 2019-09-19 (×2): qty 1

## 2019-09-19 MED ORDER — LEVOTHYROXINE SODIUM 75 MCG PO TABS
75.0000 ug | ORAL_TABLET | Freq: Every day | ORAL | Status: DC
  Administered 2019-09-19 – 2019-09-20 (×2): 75 ug
  Filled 2019-09-19 (×2): qty 1

## 2019-09-19 MED ORDER — PROSOURCE TF PO LIQD
45.0000 mL | Freq: Two times a day (BID) | ORAL | Status: DC
  Administered 2019-09-19: 45 mL
  Filled 2019-09-19: qty 45

## 2019-09-19 MED ORDER — METHYLPREDNISOLONE SODIUM SUCC 40 MG IJ SOLR
40.0000 mg | Freq: Two times a day (BID) | INTRAMUSCULAR | Status: DC
  Administered 2019-09-19: 40 mg via INTRAVENOUS
  Filled 2019-09-19: qty 1

## 2019-09-19 MED ORDER — ASPIRIN 81 MG PO CHEW
81.0000 mg | CHEWABLE_TABLET | Freq: Every day | ORAL | Status: DC
  Administered 2019-09-19: 81 mg
  Filled 2019-09-19 (×2): qty 1

## 2019-09-19 MED ORDER — VITAL AF 1.2 CAL PO LIQD
1000.0000 mL | ORAL | Status: DC
  Administered 2019-09-19: 1000 mL
  Filled 2019-09-19 (×2): qty 1000

## 2019-09-19 MED ORDER — DOXAZOSIN MESYLATE 2 MG PO TABS
1.0000 mg | ORAL_TABLET | Freq: Two times a day (BID) | ORAL | Status: DC
  Administered 2019-09-19 (×2): 1 mg
  Filled 2019-09-19 (×3): qty 0.5

## 2019-09-19 MED ORDER — MONTELUKAST SODIUM 10 MG PO TABS
10.0000 mg | ORAL_TABLET | Freq: Every day | ORAL | Status: DC
  Administered 2019-09-19: 10 mg
  Filled 2019-09-19 (×2): qty 1

## 2019-09-19 MED ORDER — METHYLPREDNISOLONE SODIUM SUCC 40 MG IJ SOLR: 40.0000 mg | Freq: Two times a day (BID) | INTRAMUSCULAR | Status: DC

## 2019-09-19 MED ORDER — FINASTERIDE 5 MG PO TABS
5.0000 mg | ORAL_TABLET | Freq: Every day | ORAL | Status: DC
  Administered 2019-09-19: 5 mg via ORAL
  Filled 2019-09-19 (×2): qty 1

## 2019-09-19 MED ORDER — CHLORHEXIDINE GLUCONATE CLOTH 2 % EX PADS
6.0000 | MEDICATED_PAD | Freq: Every day | CUTANEOUS | Status: DC
  Administered 2019-09-19 – 2019-09-23 (×4): 6 via TOPICAL

## 2019-09-19 NOTE — Progress Notes (Signed)
NAME:  Max Rivas, MRN:  [id number], DOB:  11/24/1950, LOS: 1 ADMISSION DATE:  09/18/2019, CONSULTATION DATE:  09/18/2019 REFERRING MD:  Dr, Stevie Kern, CHIEF COMPLAINT:  SOB   Brief History   69 y/o M, admitted 9/6 with acute respiratory failure, refused BiPAP, failed NRB and required intubation.  Developed VT in ER s/p DCCV x2 + amiodarone.  CXR showed bilateral small effusions.   PMH CAD, CABG, COPD, former smoker, chronic systolic HF, ischemic cardiomyopathy, EF 40-45%.    Past Medical History  CAD - s/p MI, CABG HFmrEF Ischemic Cardiomyopathy - 05/2019 EF 40-45%  VT - s/p ICD PAF - on Eliquis + amiodarone PE OSA Tobacco Abuse COPD - on home O2 Morbid Obesity   Significant Hospital Events   9/06 Admit with respiratory failure, intubated, monomorphic VT X DCCV + Amio  Consults:  Cardiology  PCCM   Procedures:  ET 9/6 >>   Significant Diagnostic Tests:  9/06 DCCV  Micro Data:  COVID 9/6 >> negative  MRSA PCR 9/6 >> negative   Antimicrobials:     Interim history/subjective:  RN/RT report pt weaning on PSV Afebrile  I/O 1.2L+ intake in last 24h, bladder scan this am with  On 125 mcg's fentanyl  Objective   Blood pressure (!) 89/57, pulse (!) 50, temperature 97.8 F (36.6 C), temperature source Axillary, resp. rate 18, height 5\' 11"  (1.803 m), weight 101 kg, SpO2 93 %.    Vent Mode: PSV;CPAP FiO2 (%):  [50 %-100 %] 50 % Set Rate:  [18 bmp] 18 bmp Vt Set:  [600 mL] 600 mL PEEP:  [5 cmH20] 5 cmH20 Pressure Support:  [5 cmH20] 5 cmH20 Plateau Pressure:  [22 cmH20-24 cmH20] 22 cmH20   Intake/Output Summary (Last 24 hours) at 09/19/2019 0801 Last data filed at 09/19/2019 0600 Gross per 24 hour  Intake 1219.77 ml  Output --  Net 1219.77 ml   Filed Weights   09/18/19 1236 09/19/19 0500  Weight: 99.8 kg 101 kg    Examination: General: adult male lying in bed on vent in NAD HEENT: MM pink/moist, ETT, pupils =/reactive  Neuro: awakens to voice, follows  commands, interactive, MAE CV: s1s2 RRR, distant tones, no m/r/g, midline sternotomy scar  PULM: non-labored on vent, lungs bilaterally with occasional scattered soft wheeze GI: soft, bsx4 active  Extremities: warm/dry, trace BLE edema / changes consistent with chronic venous stasis   Skin: no rashes or lesions  CXR 9/7 >> images personally reviewed, ETT & NGT in good position, cardiac pacer, cardiomegaly, bilateral L>R opacities/layering effusions  Resolved Hospital Problem list     Assessment & Plan:   AECOPD Acute Hypoxemic & Hypercarbic Respiratory Failure  Bilateral Pleural Effusions -PRVC 8cc/kg as rest mode -SBT / WUA with goal for extubation  -follow intermittent CXR -continue pulmicort, adjust dosing + yupelri  -continue solumedrol, reduce dose to 40 mg IV Q12 -pt has refused bipap > this may impact timing of extubation -avoid SABA if possible  -follow intermittent CXR -pending response to diuresis, may need assessment of pleural space with 11/7  Monomorphic VT  Ischemic cardiomyopathy at baseline  Acute on Chronic systolic heart failure  Bilateral effusions  -ICU monitoring  -continue amiodarone, heparin gtt's  -propofol + fentanyl for sedation -discontinue levophed from MAR  PAF on Xarelto  -hold home xarelto  -continue heparin gtt -tele monitoring -goal K+ >4, Mg+ >2  -not started on lidocaine, in NSR this am  -defer restart of metoprolol, mexiletine to EP Cardiology  CAD, HLD -resume ASA, lipitor  AKI on CKD   -Trend BMP / urinary output -Replace electrolytes as indicated -Avoid nephrotoxic agents, ensure adequate renal perfusion  Hypothyroidism  -continue synthroid   At Risk Malnutrition  -if not extubated 9/7, consider TF  Best practice:  Diet: NPO Pain/Anxiety/Delirium protocol (if indicated): prop + fent  VAP protocol (if indicated): yes  DVT prophylaxis: Heparin  GI prophylaxis: PPI  Glucose control: CBGs  Mobility: BR Code Status:  FULL Family Communication: called [name] (nephew, [phone]) 9/7, no answer / message left for return call Disposition: ICU   Labs   CBC: Recent Labs  Lab 09/18/19 1247 09/18/19 1259 09/18/19 1750 09/19/19 0223  WBC 12.3*  --   --  7.8  NEUTROABS 9.3*  --   --   --   HGB 15.1 17.0 16.3 14.2  HCT 48.6 50.0 48.0 44.3  MCV 91.5  --   --  90.0  PLT 238  --   --  204    Basic Metabolic Panel: Recent Labs  Lab 09/18/19 1247 09/18/19 1259 09/18/19 1750 09/19/19 0223  NA 136 136 135 136  K 3.7 3.7 4.6 4.3  CL 96* 97*  --  99  CO2 25  --   --  25  GLUCOSE 219* 215*  --  159*  BUN 15 17  --  23  CREATININE 1.57* 1.40*  --  2.03*  CALCIUM 8.8*  --   --  8.8*  MG 2.0  --   --  2.4  PHOS  --   --   --  4.4   GFR: Estimated Creatinine Clearance: 42.8 mL/min (A) (by C-G formula based on SCr of 2.03 mg/dL (H)). Recent Labs  Lab 09/18/19 1247 09/19/19 0223  WBC 12.3* 7.8    Liver Function Tests: No results for input(s): AST, ALT, ALKPHOS, BILITOT, PROT, ALBUMIN in the last 168 hours. No results for input(s): LIPASE, AMYLASE in the last 168 hours. No results for input(s): AMMONIA in the last 168 hours.  ABG    Component Value Date/Time   PHART 7.376 09/18/2019 1750   PCO2ART 50.6 (H) 09/18/2019 1750   PO2ART 298 (H) 09/18/2019 1750   HCO3 29.8 (H) 09/18/2019 1750   TCO2 31 09/18/2019 1750   ACIDBASEDEF 3.0 (H) 05/23/2019 1330   O2SAT 100.0 09/18/2019 1750     Coagulation Profile: No results for input(s): INR, PROTIME in the last 168 hours.  Cardiac Enzymes: No results for input(s): CKTOTAL, CKMB, CKMBINDEX, TROPONINI in the last 168 hours.  HbA1C: Hgb A1c MFr Bld  Date/Time Value Ref Range Status  09/10/2019 12:41 AM 6.2 (H) 4.8 - 5.6 % Final    Comment:    (NOTE) Pre diabetes:          5.7%-6.4%  Diabetes:              >6.4%  Glycemic control for   <7.0% adults with diabetes   05/23/2019 02:28 AM 6.3 (H) 4.8 - 5.6 % Final    Comment:     (NOTE) Pre diabetes:          5.7%-6.4% Diabetes:              >6.4% Glycemic control for   <7.0% adults with diabetes     CBG: No results for input(s): GLUCAP in the last 168 hours.    CC Time: 32 minutes    Canary Brim, MSN, NP-C [facility] 09/19/2019, 8:01 AM  Please see [email or web address] for pager details.

## 2019-09-19 NOTE — Procedures (Signed)
Thoracentesis  Procedure Note  Max Rivas  [id number]  [birth year]  Date:09/19/19  Time:11:52 AM   Provider Performing:Wes Lezotte F [name]   Procedure: Thoracentesis with imaging guidance (22633)  Indication(s) Pleural Effusion  Consent Risks of the procedure as well as the alternatives and risks of each were explained to the patient and/or caregiver.  Consent for the procedure was obtained and is signed in the bedside chart  Anesthesia Topical only with 1% lidocaine    Time Out Verified patient identification, verified procedure, site/side was marked, verified correct patient position, special equipment/implants available, medications/allergies/relevant history reviewed, required imaging and test results available.   Sterile Technique Maximal sterile technique including full sterile barrier drape, hand hygiene, sterile gown, sterile gloves, mask, hair covering, sterile ultrasound probe cover (if used).  Procedure Description Ultrasound was used to identify appropriate pleural anatomy for placement and overlying skin marked.  Area of drainage cleaned and draped in sterile fashion. Lidocaine was used to anesthetize the skin and subcutaneous tissue.  1400 cc's of rust colored appearing fluid was drained from the left pleural space. Catheter then removed and bandaid applied to site.   Complications/Tolerance None; patient tolerated the procedure well. Chest X-ray is ordered to confirm no post-procedural complication.   EBL Minimal   Specimen(s) Pleural fluid  Procedure was done under the direct supervision of Dr. Myrla Halsted , with assistance of Canary Brim NP, with live time US guidance and confirmation of location of pocket of fluid.   Bevelyn Ngo, MSN, AGACNP-BC [facility] [facility] See Amion for personal pager PCCM on call pager [phone]  09/19/2019 11:55 AM

## 2019-09-19 NOTE — Progress Notes (Signed)
ANTICOAGULATION CONSULT NOTE  Pharmacy Consult for heparin Indication: atrial fibrillation  Allergies  Allergen Reactions  . Crestor [Rosuvastatin Calcium] Other (See Comments)    Back spasms, but can tolerate it at a low dose now    Patient Measurements: Height: 5\' 11"  (180.3 cm) Weight: 101 kg (222 lb 10.6 oz) IBW/kg (Calculated) : 75.3 Heparin Dosing Weight: 95.8kg  Vital Signs: Temp: 98.9 F (37.2 C) (09/07 0817) Temp Source: Oral (09/07 0817) BP: 136/80 (09/07 0900) Pulse Rate: 74 (09/07 0901)  Labs: Recent Labs    09/18/19 1247 09/18/19 1247 09/18/19 1259 09/18/19 1259 09/18/19 1648 09/18/19 1750 09/19/19 0137 09/19/19 0223 09/19/19 0912  HGB 15.1   < > 17.0   < >  --  16.3  --  14.2  --   HCT 48.6   < > 50.0  --   --  48.0  --  44.3  --   PLT 238  --   --   --   --   --   --  204  --   APTT  --   --   --   --   --   --  154* 140* 88*  HEPARINUNFRC  --   --   --   --   --   --   --  >2.20*  --   CREATININE 1.57*  --  1.40*  --   --   --   --  2.03*  --   TROPONINIHS 15  --   --   --  43*  --   --   --   --    < > = values in this interval not displayed.    Estimated Creatinine Clearance: 42.8 mL/min (A) (by C-G formula based on SCr of 2.03 mg/dL (H)).   Medical History: Past Medical History:  Diagnosis Date  . Arthritis    "elbows, knees" (02/24/2016)  . Bronchitis 2006  . CAD (coronary artery disease)    a. s/p prior MIs - 1995 x 2, 1998; b. s/p prior LCX stenting; c. 04/2019 Cath: LM min irregs, LAD 65p/mi, D1 75, LCX 99ost/p, 72m (ISR), OM2 80, RCA/RPDA mod diff dzs; d. 05/2019 CABG x 3: LIMA->LAD, VG->Diag, VG->OM.  06/2019 COPD (chronic obstructive pulmonary disease) (HCC)    a. Remote tobacco-->on home O2.  Marland Kitchen GERD (gastroesophageal reflux disease)   . 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 05/2019 Echo: EF 40-45%, gr2 DD. Nl RV size/fxn. Mildly dil LA. Mild MR.  . High cholesterol   . Ischemic cardiomyopathy    a. 05/2019  Echo: EF 40-45%.  . Morbid obesity (HCC)   . Myocardial infarction [facility]) ~ 1995 X 2;1998; 2000; 2004  . On home oxygen therapy    "2L w/activity" (02/24/2016)  . PAF (paroxysmal atrial fibrillation) (HCC)    a. CHA2DS2VASc = 4-->eliquis. Also on amio.  . Pulmonary embolism (HCC) 02/24/2016  . Sleep apnea    "have CPAP; can't tolerate it" (02/24/2016)  . Ventricular tachycardia (HCC)    a. 2005 s/p ICD; b. 03/2011 Device upgrade/lead exchange: MDT Protecta XT VR single lead AICD.    Medications:  Medications Prior to Admission  Medication Sig Dispense Refill Last Dose  . acetaminophen (TYLENOL) 325 MG tablet Take 2 tablets (650 mg total) by mouth every 6 (six) hours as needed for mild pain (or Fever >/= 101). 30 tablet 0   . amiodarone (PACERONE) 200 MG tablet Take 1 tablet (  200 mg total) by mouth daily. (Patient taking differently: Take 200 mg by mouth daily. Patient states taking once a day MD note from 8/20 states increase to bid) 30 tablet 1   . apixaban (ELIQUIS) 5 MG TABS tablet Take 5 mg by mouth 2 (two) times daily.     Marland Kitchen aspirin 81 MG chewable tablet Chew 1 tablet (81 mg total) by mouth daily. 90 tablet 3   . atorvastatin (LIPITOR) 80 MG tablet Take 1 tablet (80 mg total) by mouth daily. 90 tablet 3   . carboxymethylcellulose (REFRESH PLUS) 0.5 % SOLN Place 1 drop into both eyes at bedtime.     . Cholecalciferol (VITAMIN D3) 50 MCG (2000 UT) TABS Take 2,000 Units by mouth daily.     Marland Kitchen dextromethorphan-guaiFENesin (MUCINEX DM) 30-600 MG 12hr tablet Take 1 tablet by mouth 2 (two) times daily as needed for cough. (Patient not taking: Reported on 09/08/2019)     . furosemide (LASIX) 40 MG tablet Take 40-80 mg by mouth 2 (two) times daily. Take 2 tablets (80 mg) in the morning and Take 1 tablet (40 mg) at bedtime     . gabapentin (NEURONTIN) 100 MG capsule Take 2 capsules (200 mg total) by mouth 2 (two) times daily. 60 capsule 1   . levalbuterol (XOPENEX HFA) 45 MCG/ACT inhaler Inhale 2  puffs into the lungs every 6 (six) hours as needed for wheezing or shortness of breath.     . levothyroxine (SYNTHROID) 75 MCG tablet Take 75 mcg by mouth daily before breakfast.     . metoprolol succinate (TOPROL-XL) 25 MG 24 hr tablet Take 12.5 mg by mouth daily.     Marland Kitchen mexiletine (MEXITIL) 150 MG capsule Take 150 mg by mouth 2 (two) times daily.     . mometasone (ASMANEX) 220 MCG/INH inhaler Inhale 2 puffs into the lungs 2 (two) times daily.      . montelukast (SINGULAIR) 10 MG tablet Take 10 mg by mouth daily.      . Multiple Vitamin (MULTIVITAMIN WITH MINERALS) TABS tablet Take 1 tablet by mouth daily.     . nitroGLYCERIN (NITROSTAT) 0.4 MG SL tablet Place 0.4 mg under the tongue every 5 (five) minutes as needed for chest pain.      Marland Kitchen omega-3 acid ethyl esters (LOVAZA) 1 g capsule Take 1 capsule (1 g total) by mouth 2 (two) times daily. 60 capsule 0   . omeprazole (PRILOSEC) 20 MG capsule Take 20 mg by mouth 2 (two) times daily before a meal.     . Propylene Glycol (SYSTANE BALANCE) 0.6 % SOLN Place 1 drop into both eyes See admin instructions. Place 1 drop into each eye four times a day and apply a warm compress for 10 minutes afterwards     . Tiotropium Bromide-Olodaterol (STIOLTO RESPIMAT) 2.5-2.5 MCG/ACT AERS Inhale 2 puffs into the lungs daily.        Assessment: 50 YOM admitted with increasing dyspnea. On apixaban at home for h/o Afib. Per patient's roommate, he took his dose of apixaban this AM.   APTT this admission came back therapeutic at 88, on 1200 units/hr. Hgb 14.2, plt 204. No s/sx of bleeding or infusion issues.   Goal of Therapy:  Stroke prevention Monitor platelets by anticoagulation protocol: Yes   Plan:  -Continue IV heparin infusion at 1200 units/hr -Monitor daily aPTT, HL and s/s of bleeding   Sherron Monday, PharmD, BCCCP Clinical Pharmacist  Phone: [phone] 09/19/2019 10:37 AM  Please check AMION  for all [facility] Pharmacy phone numbers After 10:00 PM, call Main  Pharmacy [phone]

## 2019-09-19 NOTE — Consult Note (Addendum)
ELECTROPHYSIOLOGY CONSULT NOTE    Patient ID: JASMON GRAFFAM MRN: [id number], DOB/AGE: [birth year] 67 y.o.  Admit date: 09/18/2019 Date of Consult: 09/19/2019  Primary Physician: [name], MD Primary Cardiologist: Sinclair Grooms, MD  Electrophysiologist: Dr. Lovena Le  Referring Provider: Dr. Ina Homes  Patient Profile: Max Rivas is a 69 y.o. 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 History of PAF on eliquis with recent [facility] 8/31 for afib.  History of single lead AICD and recurrent VT RX with oral amiodarone and mexiletine who is being seen today for the evaluation of VT with ICD shock at the request of Dr. Tamala Julian.  HPI:  Max Rivas is a 69 y.o. male with medical history above. Recently admitted 08/2019 for AF with recent [facility] 8/31. Discharged on amiodarone. Planned close follow up at [facility] for already planned VT ablation in 10/2019.   Pt reported being in [place] up until yesterday am. He had increasing LE edema and dyspnea and called EMS. VT noted on EKGs and pt reported his VT firing. Pt reported his ICD firing earlier in the morning (just after 1100 am). Shocked (external) in ER x 2 for recurrent VT -> NSR.  Denied chest pain and initial trop(s) negative. Started on IV amiodarone and lasix. Pt denies missing any doses of his lasix. Electrolytes OK on admission. CXR with bilateral effusions and BNP elevated to 601.  He is vaccinated against COVID and test is negative this admission. He has chronically been unable to tolerate BiPAP. Pt failed NRB and required intubation.   This am he is awake and alert on the vent -> weaning. He denies any recent illness or missed medications.     He currently (on vent) denies chest pain or SOB. Denies any recent illnesses or medication non-compliance.   ICD interrogation reveals Optivol chronically elevated since 05/2019, Pt had multiple  NSVT 9/6. At 1126 pt had VT with rates ~ 190 -> 4 ATP -> 1 shock. At 1201 pt had recurrent VT successfully treated with 1 ATP.   Current settings are: VT: 182 - 222 = Burst (4), 35J x 5 VF: > 222 = ATP during charge, 35J x 6  Past Medical History:  Diagnosis Date   Arthritis    "elbows, knees" (02/24/2016)   Bronchitis 2006   CAD (coronary artery disease)    a. s/p prior MIs - 1995 x 2, 1998; b. s/p prior LCX stenting; c. 04/2019 Cath: LM min irregs, LAD 65p/mi, D1 75, LCX 99ost/p, 59m(ISR), OM2 80, RCA/RPDA mod diff dzs; d. 05/2019 CABG x 3: LIMA->LAD, VG->Diag, VG->OM.   COPD (chronic obstructive pulmonary disease) ([place]    a. Remote tobacco-->on home O2.   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 05/2019 Echo: EF 40-45%, gr2 DD. Nl RV size/fxn. Mildly dil LA. Mild MR.   High cholesterol    Ischemic cardiomyopathy    a. 05/2019 Echo: EF 40-45%.   Morbid obesity ([facility]    Myocardial infarction ([place] ~ 1995 X 2;1998; 2000; 2004   On home oxygen therapy    "2L w/activity" (02/24/2016)   PAF (paroxysmal atrial fibrillation) (HCC)    a. CHA2DS2VASc = 4-->eliquis. Also on amio.   Pulmonary embolism ([place] 02/24/2016   Sleep apnea    "have CPAP; can't  tolerate it" (02/24/2016)   Ventricular tachycardia ([place])    a. 2005 s/p ICD; b. 03/2011 Device upgrade/lead exchange: MDT Protecta XT VR single lead AICD.     Surgical History:  Past Surgical History:  Procedure Laterality Date   CARDIOVERSION N/A 09/11/2019   Procedure: CARDIOVERSION;  Surgeon: Buford Dresser, MD;  Location: [facility] ENDOSCOPY;  Service: Cardiovascular;  Laterality: N/A;   CLIPPING OF ATRIAL APPENDAGE N/A 05/23/2019   Procedure: CLIPPING OF ATRIAL APPENDAGE with atriclip;  Surgeon: Gaye Pollack, MD;  Location: [place];  Service: Open Heart Surgery;  Laterality: N/A;   CORONARY ANGIOPLASTY  1995   CORONARY ANGIOPLASTY WITH STENT  PLACEMENT  ~ 1995 - 2004 X 5   "I've got a total of 5 stents" (02/24/2016)   CORONARY ARTERY BYPASS GRAFT N/A 05/23/2019   Procedure: CORONARY ARTERY BYPASS GRAFTING (CABG), times three, using right greater saphenous vein and left internal mammary;  Surgeon: Gaye Pollack, MD;  Location: [place];  Service: Open Heart Surgery;  Laterality: N/A;  [place] only   INSERT / REPLACE / REMOVE PACEMAKER  07/2003   original PPM around 2004 for ICM with EF < 35%   INTRAVASCULAR PRESSURE WIRE/FFR STUDY N/A 05/12/2019   Procedure: INTRAVASCULAR PRESSURE WIRE/FFR STUDY;  Surgeon: Belva Crome, MD;  Location: [place] CV LAB;  Service: Cardiovascular;  Laterality: N/A;   LEFT HEART CATH AND CORONARY ANGIOGRAPHY N/A 05/12/2019   Procedure: LEFT HEART CATH AND CORONARY ANGIOGRAPHY;  Surgeon: Belva Crome, MD;  Location: [place] CV LAB;  Service: Cardiovascular;  Laterality: N/A;   PACEMAKER GENERATOR CHANGE  03/2011    [facility]   TEE WITHOUT CARDIOVERSION N/A 05/23/2019   Procedure: TRANSESOPHAGEAL ECHOCARDIOGRAM (TEE);  Surgeon: Gaye Pollack, MD;  Location: [place];  Service: Open Heart Surgery;  Laterality: N/A;   TONSILLECTOMY       Medications Prior to Admission  Medication Sig Dispense Refill Last Dose   acetaminophen (TYLENOL) 325 MG tablet Take 2 tablets (650 mg total) by mouth every 6 (six) hours as needed for mild pain (or Fever >/= 101). 30 tablet 0    amiodarone (PACERONE) 200 MG tablet Take 1 tablet (200 mg total) by mouth daily. (Patient taking differently: Take 200 mg by mouth daily. Patient states taking once a day MD note from 8/20 states increase to bid) 30 tablet 1    apixaban (ELIQUIS) 5 MG TABS tablet Take 5 mg by mouth 2 (two) times daily.      aspirin 81 MG chewable tablet Chew 1 tablet (81 mg total) by mouth daily. 90 tablet 3    atorvastatin (LIPITOR) 80 MG tablet Take 1 tablet (80 mg total) by mouth daily. 90 tablet 3    carboxymethylcellulose (REFRESH PLUS) 0.5 %  SOLN Place 1 drop into both eyes at bedtime.      Cholecalciferol (VITAMIN D3) 50 MCG (2000 UT) TABS Take 2,000 Units by mouth daily.      dextromethorphan-guaiFENesin (MUCINEX DM) 30-600 MG 12hr tablet Take 1 tablet by mouth 2 (two) times daily as needed for cough. (Patient not taking: Reported on 09/08/2019)      furosemide (LASIX) 40 MG tablet Take 40-80 mg by mouth 2 (two) times daily. Take 2 tablets (80 mg) in the morning and Take 1 tablet (40 mg) at bedtime      gabapentin (NEURONTIN) 100 MG capsule Take 2 capsules (200 mg total) by mouth 2 (two) times daily. 60 capsule 1    levalbuterol (  XOPENEX HFA) 45 MCG/ACT inhaler Inhale 2 puffs into the lungs every 6 (six) hours as needed for wheezing or shortness of breath.      levothyroxine (SYNTHROID) 75 MCG tablet Take 75 mcg by mouth daily before breakfast.      metoprolol succinate (TOPROL-XL) 25 MG 24 hr tablet Take 12.5 mg by mouth daily.      mexiletine (MEXITIL) 150 MG capsule Take 150 mg by mouth 2 (two) times daily.      mometasone (ASMANEX) 220 MCG/INH inhaler Inhale 2 puffs into the lungs 2 (two) times daily.       montelukast (SINGULAIR) 10 MG tablet Take 10 mg by mouth daily.       Multiple Vitamin (MULTIVITAMIN WITH MINERALS) TABS tablet Take 1 tablet by mouth daily.      nitroGLYCERIN (NITROSTAT) 0.4 MG SL tablet Place 0.4 mg under the tongue every 5 (five) minutes as needed for chest pain.       omega-3 acid ethyl esters (LOVAZA) 1 g capsule Take 1 capsule (1 g total) by mouth 2 (two) times daily. 60 capsule 0    omeprazole (PRILOSEC) 20 MG capsule Take 20 mg by mouth 2 (two) times daily before a meal.      Propylene Glycol (SYSTANE BALANCE) 0.6 % SOLN Place 1 drop into both eyes See admin instructions. Place 1 drop into each eye four times a day and apply a warm compress for 10 minutes afterwards      Tiotropium Bromide-Olodaterol (STIOLTO RESPIMAT) 2.5-2.5 MCG/ACT AERS Inhale 2 puffs into the lungs daily.         Inpatient Medications:   budesonide (PULMICORT) nebulizer solution  0.25 mg Nebulization BID   chlorhexidine gluconate (MEDLINE KIT)  15 mL Mouth Rinse BID   Chlorhexidine Gluconate Cloth  6 each Topical Daily   lidocaine  50 mg Intravenous Once   mouth rinse  15 mL Mouth Rinse 10 times per day   methylPREDNISolone (SOLU-MEDROL) injection  60 mg Intravenous Q12H   pantoprazole (PROTONIX) IV  40 mg Intravenous QHS   revefenacin  175 mcg Nebulization Daily    Allergies:  Allergies  Allergen Reactions   Crestor [Rosuvastatin Calcium] Other (See Comments)    Back spasms, but can tolerate it at a low dose now    Social History   Socioeconomic History   Marital status: Divorced    Spouse name: Not on file   Number of children: Not on file   Years of education: Not on file   Highest education level: Not on file  Occupational History   Not on file  Tobacco Use   Smoking status: Former Smoker    Packs/day: 3.00    Years: 35.00    Pack years: 105.00    Types: Cigarettes    Quit date: 05/22/2019    Years since quitting: 0.3   Smokeless tobacco: Former Systems developer    Quit date: 05/21/2017  Vaping Use   Vaping Use: Never used  Substance and Sexual Activity   Alcohol use: Not Currently   Drug use: Not Currently    Types: Cocaine    Comment: none since 1995   Sexual activity: Yes  Other Topics Concern   Not on file  Social History Narrative   Not on file   Social Determinants of Health   Financial Resource Strain:    Difficulty of Paying Living Expenses: Not on file  Food Insecurity:    Worried About [place] in the Last Year:  Not on file   Ran Out of Food in the Last Year: Not on file  Transportation Needs:    Lack of Transportation (Medical): Not on file   Lack of Transportation (Non-Medical): Not on file  Physical Activity:    Days of Exercise per Week: Not on file   Minutes of Exercise per Session: Not on file  Stress:     Feeling of Stress : Not on file  Social Connections:    Frequency of Communication with Friends and Family: Not on file   Frequency of Social Gatherings with Friends and Family: Not on file   Attends Religious Services: Not on file   Active Member of Clubs or Organizations: Not on file   Attends Archivist Meetings: Not on file   Marital Status: Not on file  Intimate Partner Violence:    Fear of Current or Ex-Partner: Not on file   Emotionally Abused: Not on file   Physically Abused: Not on file   Sexually Abused: Not on file     Family History  Problem Relation Age of Onset   Heart attack Mother    Heart attack Father    Heart attack Sister 56     Review of Systems: All other systems reviewed and are otherwise negative except as noted above.  Physical Exam: Vitals:   09/19/19 0355 09/19/19 0400 09/19/19 0500 09/19/19 0600  BP:  118/72 112/66 (!) 89/57  Pulse:  61 (!) 58 (!) 50  Resp:  _0 Temp: 97.8 F (36.6 C)     TempSrc: Axillary     SpO2:  90% (!) 89% 92%  Weight:   101 kg   Height:        GEN- The patient is well appearing, alert and oriented x 3 today.   HEENT: normocephalic, atraumatic; sclera clear, conjunctiva pink; hearing intact; oropharynx clear; neck supple Lungs- Clear to ausculation bilaterally, normal work of breathing.  No wheezes, rales, rhonchi Heart- Regular rate and rhythm, no murmurs, rubs or gallops GI- soft, non-tender, non-distended, bowel sounds present Extremities- no clubbing, cyanosis, or edema; DP/PT/radial pulses 2+ bilaterally MS- no significant deformity or atrophy Skin- warm and dry, no rash or lesion Psych- euthymic mood, full affect Neuro- strength and sensation are intact  Labs:   Lab Results  Component Value Date   WBC 7.8 09/19/2019   HGB 14.2 09/19/2019   HCT 44.3 09/19/2019   MCV 90.0 09/19/2019   PLT 204 09/19/2019    Recent Labs  Lab 09/19/19 0223  NA 136  K 4.3  CL 99  CO2 25   BUN 23  CREATININE 2.03*  CALCIUM 8.8*  GLUCOSE 159*      Radiology/Studies: DG Chest 2 View  Result Date: 08/28/2019 CLINICAL DATA:  Chest pain. EXAM: CHEST - 2 VIEW COMPARISON:  Most recent radiograph 06/28/2019.  Chest CT 07/25/2018 FINDINGS: Median sternotomy. Clipping of left atrial appendage. Left-sided pacemaker in place with unchanged leads. Stable cardiomediastinal contours with upper normal heart size. Chronic right pleural thickening and pleural calcifications. Scarring at the right lung base. Small left pleural effusion has improved from prior exam. No pulmonary edema or acute airspace disease. No pneumothorax. No acute osseous abnormalities are seen. IMPRESSION: 1. No acute findings. 2. Small left pleural effusion has improved from prior exam. 3. Chronic right pleural thickening, pleural calcifications, and basilar scarring. Electronically Signed   By: Keith Rake M.D.   On: 08/28/2019 01:33   DG Chest [facility]  Result Date: 09/19/2019 CLINICAL DATA:  Intubation. EXAM: PORTABLE CHEST 1 VIEW COMPARISON:  09/18/2019.  CT 07/25/2018. FINDINGS: Endotracheal tube and NG tube in stable position. Cardiac pacer stable position. Prior median sternotomy. Left atrial appendage clip in stable position. Cardiomegaly again noted. No pulmonary venous congestion. Stable prominent central pulmonary arteries. Left lower lobe atelectasis again noted. Diffuse bilateral pulmonary infiltrates/edema again noted. Bilateral pleural thickening/effusions again noted. Calcified pleural plaques again noted. IMPRESSION: 1.  Endotracheal tube and NG tube in stable position. 2. Cardiac pacer in stable position. Prior median sternotomy. Left atrial appendage clip in stable position. Cardiomegaly again noted. No pulmonary venous congestion. Prominent central pulmonary arteries again noted suggesting pulmonary hypertension. 3. Left lower lobe atelectasis again noted. Diffuse bilateral pulmonary infiltrates/edema  again noted. Similar findings noted on prior exam. Bilateral pleural thickening/effusions again noted. Calcified pleural plaques, possibly from prior asbestos exposure, again noted Electronically Signed   By: [place]   On: 09/19/2019 06:26   DG Chest Portable 1 View  Result Date: 09/18/2019 CLINICAL DATA:  Intubation and orogastric tube placement. EXAM: PORTABLE CHEST 1 VIEW COMPARISON:  09/18/2019 at 12:58 p.m. FINDINGS: New endotracheal tube has its tip projecting 4 cm above the carina. Orogastric tube passes below the diaphragm into the proximal stomach. Bilateral interstitial and hazy airspace lung opacities are unchanged from the earlier exam. Additional opacity at the lung bases is likely due to small effusions with atelectasis, greater on the left, also unchanged. IMPRESSION: 1. Well-positioned endotracheal tube and nasal/orogastric tube as detailed. No other change from the earlier study. Electronically Signed   By: Lajean Manes M.D.   On: 09/18/2019 16:05   DG Chest Portable 1 View  Result Date: 09/18/2019 CLINICAL DATA:  Ventricular tachycardia. EXAM: PORTABLE CHEST 1 VIEW COMPARISON:  September 08, 2019 FINDINGS: Cardiac pacemaker in stable position. Cardiomediastinal silhouette is stable. Bilateral pleural effusions. Increased interstitial markings superimposed on pre-existing pleural subpleural scarring. Osseous structures are without acute abnormality. Soft tissues are grossly normal. IMPRESSION: 1. Bilateral pleural effusions. 2. Increased interstitial markings superimposed on pre-existing pleural subpleural scarring, likely represent interstitial pulmonary edema. Electronically Signed   By: Fidela Salisbury M.D.   On: 09/18/2019 13:22   DG Chest Port 1 View  Result Date: 09/08/2019 CLINICAL DATA:  Shortness of breath EXAM: PORTABLE CHEST 1 VIEW COMPARISON:  08/28/2019 FINDINGS: Left AICD remains in place, unchanged. Cardiomegaly, vascular congestion. There is hyperinflation of the  lungs compatible with COPD. Probable underlying fibrosis within the lungs. Small right effusion and moderate left effusion. Mid and lower lung increased interstitial markings and airspace disease likely reflect superimposed edema. IMPRESSION: Cardiomegaly. COPD/chronic lung disease. Bilateral pleural effusions, enlarged since prior study, moderate on the left. Probable mild superimposed pulmonary edema. Electronically Signed   By: Rolm Baptise M.D.   On: 09/08/2019 18:05   ECHOCARDIOGRAM COMPLETE  Result Date: 09/09/2019    ECHOCARDIOGRAM REPORT   Patient Name:   Max Rivas Date of Exam: 09/09/2019 Medical Rec #:  [id number]      Height:       71.0 in Accession #:    [id number]     Weight:       221.0 lb Date of Birth:  18-Apr-1950      BSA:          2.200 m Patient Age:    34 years       BP:           107/74 mmHg Patient Gender: M  HR:           84 bpm. Exam Location:  Inpatient Procedure: 2D Echo, Color Doppler, Cardiac Doppler and Intracardiac            Opacification Agent Indications:    I48.91* Unspecified atrial fibrillation  History:        Patient has prior history of Echocardiogram examinations, most                 recent 05/23/2019. Pacemaker and Defibrillator, COPD,                 Arrythmias:Atrial Fibrillation; Risk Factors:Dyslipidemia and                 Sleep Apnea.  Sonographer:    Raquel Sarna Senior RDCS Referring Phys: [id number] [place]  Sonographer Comments: Technically difficult, patient scanned sitting up due to difficulty breathing. IMPRESSIONS  1. Left ventricular ejection fraction, by estimation, is 30 to 35%. The left ventricle has moderately decreased function. The left ventricle demonstrates global hypokinesis. The left ventricular internal cavity size was moderately dilated. Left ventricular diastolic parameters are consistent with Grade I diastolic dysfunction (impaired relaxation).  2. Right ventricular systolic function is moderately reduced. The right  ventricular size is mildly enlarged. There is moderately elevated pulmonary artery systolic pressure.  3. Left atrial size was severely dilated.  4. Right atrial size was mildly dilated.  5. The mitral valve is normal in structure. Mild mitral valve regurgitation. No evidence of mitral stenosis.  6. The aortic valve is normal in structure. Aortic valve regurgitation is not visualized. No aortic stenosis is present.  7. The inferior vena cava is dilated in size with <50% respiratory variability, suggesting right atrial pressure of 15 mmHg. FINDINGS  Left Ventricle: Left ventricular ejection fraction, by estimation, is 30 to 35%. The left ventricle has moderately decreased function. The left ventricle demonstrates global hypokinesis. Definity contrast agent was given IV to delineate the left ventricular endocardial borders. The left ventricular internal cavity size was moderately dilated. There is no left ventricular hypertrophy. Left ventricular diastolic parameters are consistent with Grade I diastolic dysfunction (impaired relaxation).  LV Wall Scoring: The basal inferolateral segment is akinetic. Right Ventricle: The right ventricular size is mildly enlarged. No increase in right ventricular wall thickness. Right ventricular systolic function is moderately reduced. There is moderately elevated pulmonary artery systolic pressure. The tricuspid regurgitant velocity is 2.88 m/s, and with an assumed right atrial pressure of 15 mmHg, the estimated right ventricular systolic pressure is 35.0 mmHg. Left Atrium: Left atrial size was severely dilated. Right Atrium: Right atrial size was mildly dilated. Pericardium: There is no evidence of pericardial effusion. Mitral Valve: The mitral valve is normal in structure. Normal mobility of the mitral valve leaflets. Mild mitral valve regurgitation. No evidence of mitral valve stenosis. Tricuspid Valve: The tricuspid valve is normal in structure. Tricuspid valve regurgitation is  mild . No evidence of tricuspid stenosis. Aortic Valve: The aortic valve is normal in structure. Aortic valve regurgitation is not visualized. No aortic stenosis is present. Pulmonic Valve: The pulmonic valve was normal in structure. Pulmonic valve regurgitation is not visualized. No evidence of pulmonic stenosis. Aorta: The aortic root is normal in size and structure. Venous: The inferior vena cava is dilated in size with less than 50% respiratory variability, suggesting right atrial pressure of 15 mmHg. IAS/Shunts: No atrial level shunt detected by color flow Doppler. Additional Comments: A pacer wire is visualized.  LEFT VENTRICLE PLAX 2D  LVIDd:         6.40 cm LVIDs:         6.30 cm LV PW:         0.60 cm LV IVS:        0.90 cm LVOT diam:     2.00 cm LV SV:         33 LV SV Index:   15 LVOT Area:     3.14 cm  RIGHT VENTRICLE RV S prime:     5.44 cm/s TAPSE (M-mode): 1.4 cm LEFT ATRIUM              Index       RIGHT ATRIUM           Index LA diam:        5.90 cm  2.68 cm/m  RA Area:     19.40 cm LA Vol (A2C):   112.0 ml 50.91 ml/m RA Volume:   53.50 ml  24.32 ml/m LA Vol (A4C):   129.0 ml 58.64 ml/m LA Biplane Vol: 121.0 ml 55.00 ml/m  AORTIC VALVE LVOT Vmax:   66.58 cm/s LVOT Vmean:  46.125 cm/s LVOT VTI:    0.104 m  AORTA Ao Root diam: 3.00 cm TRICUSPID VALVE TR Peak grad:   33.2 mmHg TR Vmax:        288.00 cm/s  SHUNTS Systemic VTI:  0.10 m Systemic Diam: 2.00 cm Candee Furbish MD Electronically signed by Candee Furbish MD Signature Date/Time: 09/09/2019/1:11:22 PM    Final     EKG: EKG on arrival shows WCT at 142 bpm, with ? Retrograde conduction and different axis/morphology from his AF leaning more towards VT (personally reviewed)  Repeat EKG shows Sinus tachycardia at 111 bpm, QRS 127 ms.   TELEMETRY: SB/NSR 50-70s, VT quiescent overnight(personally reviewed)  DEVICE HISTORY: MDT ICD 2005, s/p gen change/lead exchange 03/2011  Assessment/Plan: 1.  VT Quiescent overnight.  Continue IV  amiodarone Resume mexitil  Would resume toprol as BP tolerates.  K 4.3 and Mg 2.4, Goal K > 3.9 and Mg > 1.9 He is scheduled for outpatient VT ablation at [facility] for 10/18/2019 with Dr. Elias Else Shantha   2. Acute on chronic systolic CHF Echo 2/48/1443 LVEF 30-35%, Grade 1 DD, Mod RV dysfunction + effusions by CXR.  Diuresis per primary team to allow for extubation.   3. Pleural effusion Now s/p thoracentesis with 1400 cc of rust colored appearing fluid removed from left pleural space.  4. CAD s/p CABG 05/23/2019 Denies chest pain.  HS troponin negative.   5. Acute on chronic respiratory failure/COPD, on home 02 Diuresing.  WBC WNL CXR with bilateral effusions -> s/p thoracentesis as above. Intubated for airway protection -> attempting wean this am.   6. PAF Holding eliquis and covering with heparin with recent [facility] in the event he needs procedure this admission. Will discuss with MD, but suspect can transition back to Eliquis once he is extubated.   For questions or updates, please contact [place] Please consult [email or web address] for contact info under Cardiology/STEMI.  Signed, Shirley Friar, PA-C  09/19/2019 7:33 AM   I have seen, examined the patient, and reviewed the above assessment and plan.  Changes to above are made where necessary.  On exam, ill appearing, intubated, alert, RRR.  VT is currently improved with IV amiodarone. Plans for VT ablation at [facility] are noted. EP to follow.  Co Sign: Thompson Grayer, MD 09/19/2019 6:15 PM

## 2019-09-19 NOTE — Progress Notes (Addendum)
Nephew Jill Alexanders) updated via phone.  [place] lives in [place].  Reviewed events of patients admission, and plan of care.  Hopeful that all acute issues are reversible and he will come off the ventilator soon. He indicates understanding.  Consent obtained via phone x2 RN for left thoracentesis.  Questions answered, support offered.     Canary Brim, MSN, NP-C [facility] 09/19/2019, 1:18 PM   Please see [email or web address] for pager details.

## 2019-09-19 NOTE — Progress Notes (Signed)
ANTICOAGULATION CONSULT NOTE   Pharmacy Consult for heparin Indication: atrial fibrillation  Assessment: 36 YOM admitted with increasing dyspnea. On apixaban at home for h/o Afib. Per patient's roommate, he took his dose of apixaban this AM.   APTT this am 154 sec  Goal of Therapy:  Stroke prevention Monitor platelets by anticoagulation protocol: Yes   Plan:  -Decrease heparin to 1200 units/hr -F/u 6 hr aPTT -Monitor daily aPTT, HL and s/s of bleeding   Thanks for allowing pharmacy to be a part of this patient's care.  Talbert Cage, PharmD Clinical Pharmacist

## 2019-09-19 NOTE — Progress Notes (Signed)
Mr. Dettman began feeling nauseous and vomited around tube. Suctioned orally with tan thin liquid coming up through tube. Inlined suctioned with minimal clear secretions. TF held. PRN zofran given. Nausea continued. RT came to bedside and assessed pt, bagged to verify tube clear and not congested or occluded. Rhythm changed to sustained VT . E-link notified in room and did camera in. Updated on events. VT subsided to ST once Mr. Barraco calmed down. Switched back from [facility] to Pressure Support as Mr. Delone was tolerating it better. Nausea seemed to invoke anxiousness which then brought on VT. Pt is currently more relaxed, less nauseous and tolerating vent. Will continue to monitor.

## 2019-09-19 NOTE — Progress Notes (Signed)
Initial Nutrition Assessment  DOCUMENTATION CODES:   Not applicable  INTERVENTION:   Tube Feeding via OG tube:  Vital AF 1.2 at 70 ml/hr Pro-Source 45 mL daily Provides 2056 kcals, 137 g of protein and 1361 mL of free water  NUTRITION DIAGNOSIS:   Inadequate oral intake related to acute illness as evidenced by NPO status.  GOAL:   Patient will meet greater than or equal to 90% of their needs  MONITOR:   Vent status, Labs, Weight trends, TF tolerance  REASON FOR ASSESSMENT:   Consult, Ventilator Enteral/tube feeding initiation and management  ASSESSMENT:   69 yo male admitted with acute respiratory failure with bilateral pleural effusions, AECOPD; AKI on CKD. PMH includes CAD/CABG, COPD on home oxygen, former smoker, CHF, ischemic CM EF 40-45%  9/06 Admitted, intubated  Patient is currently intubated on ventilator support MV: 12.6 L/min Temp (24hrs), Avg:98.1 F (36.7 C), Min:97.7 F (36.5 C), Max:98.9 F (37.2 C)  Propofol: OFF  OG tube in place  Unclear weight trend per weight encounters. Unable to obtain diet and weight history from patient at this time. Current wt 101 kg.   Labs: Creatinine 2.03, BUN wdl Meds:  Solumedrol   Diet Order:   Diet Order            Diet NPO time specified  Diet effective now                 EDUCATION NEEDS:   Not appropriate for education at this time  Skin:  Skin Assessment: Reviewed RN Assessment  Last BM:  no documented BM  Height:   Ht Readings from Last 1 Encounters:  09/18/19 5\' 11"  (1.803 m)    Weight:   Wt Readings from Last 1 Encounters:  09/19/19 101 kg    BMI:  Body mass index is 31.06 kg/m.  Estimated Nutritional Needs:   Kcal:  2130 kcals  Protein:  125-150 g  Fluid:  >/= 2 L   2131 MS, RDN, LDN, CNSC Registered Dietitian III Clinical Nutrition RD Pager and On-Call Pager Number Located in [place]

## 2019-09-20 ENCOUNTER — Inpatient Hospital Stay (HOSPITAL_COMMUNITY): Payer: No Typology Code available for payment source

## 2019-09-20 DIAGNOSIS — I5023 Acute on chronic systolic (congestive) heart failure: Secondary | ICD-10-CM

## 2019-09-20 DIAGNOSIS — R1110 Vomiting, unspecified: Secondary | ICD-10-CM

## 2019-09-20 LAB — BASIC METABOLIC PANEL
Anion gap: 13 (ref 5–15)
BUN: 37 mg/dL — ABNORMAL HIGH (ref 8–23)
CO2: 26 mmol/L (ref 22–32)
Calcium: 8.8 mg/dL — ABNORMAL LOW (ref 8.9–10.3)
Chloride: 96 mmol/L — ABNORMAL LOW (ref 98–111)
Creatinine, Ser: 1.61 mg/dL — ABNORMAL HIGH (ref 0.61–1.24)
GFR calc Af Amer: 51 mL/min — ABNORMAL LOW (ref >60)
GFR calc non Af Amer: 44 mL/min — ABNORMAL LOW (ref >60)
Glucose, Bld: 147 mg/dL — ABNORMAL HIGH (ref 70–99)
Potassium: 3.7 mmol/L (ref 3.5–5.1)
Sodium: 135 mmol/L (ref 135–145)

## 2019-09-20 LAB — GLUCOSE, CAPILLARY
Glucose-Capillary: 138 mg/dL — ABNORMAL HIGH (ref 70–99)
Glucose-Capillary: 143 mg/dL — ABNORMAL HIGH (ref 70–99)
Glucose-Capillary: 143 mg/dL — ABNORMAL HIGH (ref 70–99)
Glucose-Capillary: 144 mg/dL — ABNORMAL HIGH (ref 70–99)
Glucose-Capillary: 164 mg/dL — ABNORMAL HIGH (ref 70–99)
Glucose-Capillary: 178 mg/dL — ABNORMAL HIGH (ref 70–99)

## 2019-09-20 LAB — CBC
HCT: 42.8 % (ref 39.0–52.0)
Hemoglobin: 13.6 g/dL (ref 13.0–17.0)
MCH: 28.5 pg (ref 26.0–34.0)
MCHC: 31.8 g/dL (ref 30.0–36.0)
MCV: 89.7 fL (ref 80.0–100.0)
Platelets: 166 10*3/uL (ref 150–400)
RBC: 4.77 MIL/uL (ref 4.22–5.81)
RDW: 16.8 % — ABNORMAL HIGH (ref 11.5–15.5)
WBC: 10.9 10*3/uL — ABNORMAL HIGH (ref 4.0–10.5)
nRBC: 0 % (ref 0.0–0.2)

## 2019-09-20 LAB — APTT: aPTT: 80 seconds — ABNORMAL HIGH (ref 24–36)

## 2019-09-20 LAB — CYTOLOGY - NON PAP

## 2019-09-20 LAB — PHOSPHORUS: Phosphorus: 4.1 mg/dL (ref 2.5–4.6)

## 2019-09-20 LAB — MAGNESIUM: Magnesium: 2.3 mg/dL (ref 1.7–2.4)

## 2019-09-20 LAB — HEPARIN LEVEL (UNFRACTIONATED): Heparin Unfractionated: >2.2 IU/mL — ABNORMAL HIGH (ref 0.30–0.70)

## 2019-09-20 LAB — BRAIN NATRIURETIC PEPTIDE: B Natriuretic Peptide: 549.2 pg/mL — ABNORMAL HIGH (ref 0.0–100.0)

## 2019-09-20 MED ORDER — ASPIRIN 81 MG PO CHEW
81.0000 mg | CHEWABLE_TABLET | Freq: Every day | ORAL | Status: DC
  Administered 2019-09-20 – 2019-09-23 (×4): 81 mg via ORAL
  Filled 2019-09-20 (×3): qty 1

## 2019-09-20 MED ORDER — POTASSIUM CHLORIDE CRYS ER 20 MEQ PO TBCR
40.0000 meq | EXTENDED_RELEASE_TABLET | Freq: Once | ORAL | Status: AC
  Administered 2019-09-20: 40 meq via ORAL
  Filled 2019-09-20: qty 2

## 2019-09-20 MED ORDER — ATORVASTATIN CALCIUM 80 MG PO TABS
80.0000 mg | ORAL_TABLET | Freq: Every day | ORAL | Status: DC
  Administered 2019-09-21 – 2019-09-23 (×3): 80 mg via ORAL
  Filled 2019-09-20 (×3): qty 1

## 2019-09-20 MED ORDER — MEXILETINE HCL 150 MG PO CAPS
150.0000 mg | ORAL_CAPSULE | Freq: Two times a day (BID) | ORAL | Status: DC
  Filled 2019-09-20: qty 1

## 2019-09-20 MED ORDER — TAMSULOSIN HCL 0.4 MG PO CAPS
0.4000 mg | ORAL_CAPSULE | Freq: Every day | ORAL | Status: DC
  Administered 2019-09-20 – 2019-09-22 (×3): 0.4 mg via ORAL
  Filled 2019-09-20 (×3): qty 1

## 2019-09-20 MED ORDER — ORAL CARE MOUTH RINSE
15.0000 mL | Freq: Two times a day (BID) | OROMUCOSAL | Status: DC
  Administered 2019-09-22 – 2019-09-23 (×3): 15 mL via OROMUCOSAL

## 2019-09-20 MED ORDER — MEXILETINE HCL 150 MG PO CAPS
150.0000 mg | ORAL_CAPSULE | Freq: Two times a day (BID) | ORAL | Status: DC
  Administered 2019-09-20 – 2019-09-23 (×6): 150 mg via ORAL
  Filled 2019-09-20 (×8): qty 1

## 2019-09-20 MED ORDER — LEVOTHYROXINE SODIUM 75 MCG PO TABS
75.0000 ug | ORAL_TABLET | Freq: Every day | ORAL | Status: DC
  Administered 2019-09-21 – 2019-09-23 (×3): 75 ug via ORAL
  Filled 2019-09-20 (×3): qty 1

## 2019-09-20 MED ORDER — MONTELUKAST SODIUM 10 MG PO TABS
10.0000 mg | ORAL_TABLET | Freq: Every day | ORAL | Status: DC
  Administered 2019-09-21 – 2019-09-23 (×3): 10 mg via ORAL
  Filled 2019-09-20 (×3): qty 1

## 2019-09-20 MED ORDER — METHYLPREDNISOLONE SODIUM SUCC 40 MG IJ SOLR
40.0000 mg | Freq: Every day | INTRAMUSCULAR | Status: DC
  Administered 2019-09-21: 40 mg via INTRAVENOUS
  Filled 2019-09-20 (×2): qty 1

## 2019-09-20 MED ORDER — FENTANYL CITRATE (PF) 100 MCG/2ML IJ SOLN: 25.0000 ug | INTRAMUSCULAR | Status: DC | PRN

## 2019-09-20 MED ORDER — METOCLOPRAMIDE HCL 5 MG/ML IJ SOLN
10.0000 mg | Freq: Once | INTRAMUSCULAR | Status: AC
  Administered 2019-09-20: 10 mg via INTRAVENOUS
  Filled 2019-09-20: qty 2

## 2019-09-20 NOTE — Progress Notes (Signed)
Pt placed back on full support vent settings due to low RR since pt is more sleepy. Pt tolerated well

## 2019-09-20 NOTE — Progress Notes (Signed)
Pt very anxious and desynchronous on full support after previous episode of vomiting.  Spoke with E-link, is ok to leave on CPAP/PS over night for pt's comfort. Pt is getting 800-975ml Vt and Ve 11-12. No distress noted.  Will continue to monitor.

## 2019-09-20 NOTE — Procedures (Signed)
Extubation Procedure Note  Patient Details:   Name: Max Rivas DOB: 24-Aug-1950 MRN: [id number]   Airway Documentation:    Vent end date: 09/20/19 Vent end time: 1045   Evaluation  O2 sats: stable throughout Complications: No apparent complications Patient did tolerate procedure well. Bilateral Breath Sounds: Clear   Yes  Pt placed on 6lpm and tolerating well  Melanee Spry 09/20/2019, 10:53 AM

## 2019-09-20 NOTE — Evaluation (Signed)
Clinical/Bedside Swallow Evaluation Patient Details  Name: Max Rivas MRN: [id number] Date of Birth: June 29, 1950  Today's Date: 09/20/2019 Time: SLP Start Time (ACUTE ONLY): 1410 SLP Stop Time (ACUTE ONLY): 1426 SLP Time Calculation (min) (ACUTE ONLY): 16 min  Past Medical History:  Past Medical History:  Diagnosis Date  . Arthritis    "elbows, knees" (02/24/2016)  . Bronchitis 2006  . CAD (coronary artery disease)    a. s/p prior MIs - 1995 x 2, 1998; b. s/p prior LCX stenting; c. 04/2019 Cath: LM min irregs, LAD 65p/mi, D1 75, LCX 99ost/p, 71m (ISR), OM2 80, RCA/RPDA mod diff dzs; d. 05/2019 CABG x 3: LIMA->LAD, VG->Diag, VG->OM.  Marland Kitchen COPD (chronic obstructive pulmonary disease) (HCC)    a. Remote tobacco-->on home O2.  Marland Kitchen GERD (gastroesophageal reflux disease)   . 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 05/2019 Echo: EF 40-45%, gr2 DD. Nl RV size/fxn. Mildly dil LA. Mild MR.  . High cholesterol   . Ischemic cardiomyopathy    a. 05/2019 Echo: EF 40-45%.  . Morbid obesity (HCC)   . [facility]) ~ 1995 X 2;1998; 2000; 2004  . On home oxygen therapy    "2L w/activity" (02/24/2016)  . PAF (paroxysmal atrial fibrillation) (HCC)    a. CHA2DS2VASc = 4-->eliquis. Also on amio.  . Pulmonary embolism (HCC) 02/24/2016  . Sleep apnea    "have CPAP; can't tolerate it" (02/24/2016)  . Ventricular tachycardia (HCC)    a. 2005 s/p ICD; b. 03/2011 Device upgrade/lead exchange: MDT Protecta XT VR single lead AICD.   Past Surgical History:  Past Surgical History:  Procedure Laterality Date  . CARDIOVERSION N/A 09/11/2019   Procedure: CARDIOVERSION;  Surgeon: Jodelle Red, MD;  Location: [facility] ENDOSCOPY;  Service: Cardiovascular;  Laterality: N/A;  . CLIPPING OF ATRIAL APPENDAGE N/A 05/23/2019   Procedure: CLIPPING OF ATRIAL APPENDAGE with atriclip;  Surgeon: [name], MD;  Location: [facility] OR;  Service: Open Heart Surgery;  Laterality: N/A;  .  CORONARY ANGIOPLASTY  1995  . CORONARY ANGIOPLASTY WITH STENT PLACEMENT  ~ 1995 - 2004 X 5   "I've got a total of 5 stents" (02/24/2016)  . CORONARY ARTERY BYPASS GRAFT N/A 05/23/2019   Procedure: CORONARY ARTERY BYPASS GRAFTING (CABG), times three, using right greater saphenous vein and left internal mammary;  Surgeon: [name], MD;  Location: [facility] OR;  Service: Open Heart Surgery;  Laterality: N/A;  [place] only  . 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 INTRAVASCULAR PRESSURE WIRE/FFR STUDY N/A 05/12/2019   Procedure: INTRAVASCULAR PRESSURE WIRE/FFR STUDY;  Surgeon: Lyn Records, MD;  Location: [facility] INVASIVE CV LAB;  Service: Cardiovascular;  Laterality: N/A;  . LEFT HEART CATH AND CORONARY ANGIOGRAPHY N/A 05/12/2019   Procedure: LEFT HEART CATH AND CORONARY ANGIOGRAPHY;  Surgeon: Lyn Records, MD;  Location: [facility] INVASIVE CV LAB;  Service: Cardiovascular;  Laterality: N/A;  . PACEMAKER GENERATOR CHANGE  03/2011    [facility]  . TEE WITHOUT CARDIOVERSION N/A 05/23/2019   Procedure: TRANSESOPHAGEAL ECHOCARDIOGRAM (TEE);  Surgeon: [name], MD;  Location: [facility] OR;  Service: Open Heart Surgery;  Laterality: N/A;  . TONSILLECTOMY     HPI:  [name] is a 69 y.o. male admitted to [facility] 09/18/19 with acute on chronic respiratory failure requiring ETT 9/6-9/8. PMH significant for GERD, COPD, CAD, sleep apnea, myocardial infarction, pulmonary embolism  and pt is on home O2 at baseline.   Assessment / Plan / Recommendation  Clinical Impression   Pt presents with s/sx of mild pharyngeal dysphagia, most likely s/p recent intubation. Pt's oral phase was [facility] with good bolus awareness, oral control, timely transit and complete oral clearance of thins and puree. Mild intermittent delayed throat clearing and cough noted after sips of thin H2O and a cup of applesauce. Throat clearing appeared more consistent after sips administered via straw in  comparison to those consumed via cup. Pt also observed to take his oral medications whole with thins WFL. Although pt denied nausea during evaluation, he has experienced nausea and vomiting within the last 24 hrs, was only extubated within the last 4 hrs, and has decreased respiratory status. Therefore, would recommend iniating a conservative clear thin liquid diet, NO STRAWS please, and medications whole with thins one at a time. Large pills should be cut in half. ST will continue to follow acutely to ensure diet safety and efficiency, as well as potential for advancement and to determine if there is a need for instrumental testing.    SLP Visit Diagnosis: Dysphagia, pharyngeal phase (R13.13)    Aspiration Risk  Moderate aspiration risk    Diet Recommendation Thin liquid;Other (Comment) (clear liquids)   Liquid Administration via: Cup;No straw Medication Administration: Whole meds with liquid Supervision: Patient able to self feed;Intermittent supervision to cue for compensatory strategies Compensations: Slow rate;Small sips/bites Postural Changes: Seated upright at 90 degrees;Remain upright for at least 30 minutes after po intake    Other  Recommendations Oral Care Recommendations: Oral care BID   Follow up Recommendations Other (comment) (TBD depending on progress)      Frequency and Duration min 2x/week  2 weeks       Prognosis Prognosis for Safe Diet Advancement: Good      Swallow Study   General Date of Onset: 09/18/19 HPI: [name] is a 69 y.o. male admitted to [facility] 09/18/19 with acute on chronic respiratory failure requiring ETT 9/6-9/8. PMH significant for GERD, COPD, CAD, sleep apnea, myocardial infarction, pulmonary embolism and pt is on home O2 at baseline. Type of Study: Bedside Swallow Evaluation Previous Swallow Assessment: none found in chart Diet Prior to this Study: NPO Temperature Spikes Noted: No Respiratory Status: Nasal cannula History of Recent Intubation:  Yes Length of Intubations (days): 2 days Date extubated: 09/20/19 Behavior/Cognition: Alert;Cooperative;Pleasant mood Oral Cavity Assessment: Within Functional Limits Oral Care Completed by SLP: Yes Oral Cavity - Dentition: Poor condition Vision: Functional for self-feeding Self-Feeding Abilities: Able to feed self Patient Positioning: Upright in bed Baseline Vocal Quality: Hoarse Volitional Cough: Congested;Strong Volitional Swallow: Able to elicit    Oral/Motor/Sensory Function Overall Oral Motor/Sensory Function: Within functional limits   Ice Chips Ice chips: Within functional limits Presentation: Spoon   Thin Liquid Thin Liquid: Impaired Presentation: Cup;Straw Pharyngeal  Phase Impairments: Throat Clearing - Delayed;Cough - Delayed    Nectar Thick Nectar Thick Liquid: Not tested   Honey Thick Honey Thick Liquid: Not tested   Puree Puree: Impaired Presentation: Spoon;Self Fed Pharyngeal Phase Impairments: Throat Clearing - Delayed   Solid     Solid: Not tested     Suzzette Righter, CCC-SLP   [place] 09/20/2019,2:53 PM

## 2019-09-20 NOTE — Progress Notes (Addendum)
NAME:  Max Rivas, MRN:  [id number], DOB:  04-16-1950, LOS: 2 ADMISSION DATE:  09/18/2019, CONSULTATION DATE:  09/18/2019 REFERRING MD:  Dr, Stevie Kern, CHIEF COMPLAINT:  SOB   Brief History   69 y/o M, admitted 9/6 with acute respiratory failure, refused BiPAP, failed NRB and required intubation.  Developed VT in ER s/p DCCV x2 + amiodarone.  CXR showed bilateral small effusions.   PMH CAD, CABG, COPD, former smoker, chronic systolic HF, ischemic cardiomyopathy, EF 40-45%.    Past Medical History  CAD - s/p MI, CABG HFmrEF Ischemic Cardiomyopathy - 05/2019 EF 40-45%  VT - s/p ICD PAF - on Eliquis + amiodarone PE OSA Tobacco Abuse COPD - on home O2 Morbid Obesity   Significant Hospital Events   9/06 Admit with respiratory failure, intubated, monomorphic VT X DCCV + Amio 9/07 PSV wean, thoracentesis with 1.4L fluid removed 9/08 PSV wean   Consults:  Cardiology  PCCM   Procedures:  ETT 9/6 >>   Significant Diagnostic Tests:  9/06 DCCV 9/07 Left Thoracentesis >> LD78, protein <3, TNC 405, lymph 71  Micro Data:  COVID 9/6 >> negative  MRSA PCR 9/6 >> negative  L Pleural Culture 9/7 >>  L Pleural Cytology 9/7 >>   Antimicrobials:     Interim history/subjective:  RN reports pt had episode of nausea / vomiting overnight. Developed 3 min of VT.   TF on hold Pt weaning on 5/5, awake / alert  Tmax 99  Glucose range 144-164  Weight down to 98.9 kg  I/O 1.3L UOP, -2.2L in last 24 hours  Objective   Blood pressure 103/70, pulse 61, temperature 99 F (37.2 C), temperature source Oral, resp. rate 18, height 5\' 11"  (1.803 m), weight 98.9 kg, SpO2 99 %.    Vent Mode: CPAP;PSV FiO2 (%):  [40 %] 40 % Set Rate:  [18 bmp] 18 bmp Vt Set:  [600 mL] 600 mL PEEP:  [5 cmH20] 5 cmH20 Pressure Support:  [5 cmH20-8 cmH20] 5 cmH20 Plateau Pressure:  [22 cmH20] 22 cmH20   Intake/Output Summary (Last 24 hours) at 09/20/2019 0818 Last data filed at 09/20/2019 0600 Gross per 24 hour   Intake 417.57 ml  Output 2760 ml  Net -2342.43 ml   Filed Weights   09/18/19 1236 09/19/19 0500 09/20/19 0500  Weight: 99.8 kg 101 kg 98.9 kg    Examination: General: adult male lying in bed in NAD on vent watching TV HEENT: MM pink/moist, ETT Neuro: Awake/alert, MAE, follows commands, appropriate  CV: s1s2 irr irr, rate 70's, no m/r/g PULM: non-labored on PSV wean, lungs bilaterally clear / good air entry  GI: soft, bsx4 active  Extremities: warm/dry, trace dependent edema Skin: no rashes or lesions  Resolved Hospital Problem list     Assessment & Plan:   AECOPD Acute Hypoxemic & Hypercarbic Respiratory Failure  Bilateral Pleural Effusions, Proven Transudate on Left  -PRVC 8cc/kg as rest mode  -SBT now with goal for extubation  -follow intermittent CXR  -continue pulmicort, yupelri -continue solumedrol, reduce dose to 40 mg QD -note he has refused bipap during admit which may impact successful extubation  Monomorphic VT  Ischemic cardiomyopathy at baseline  Acute on Chronic systolic heart failure  Bilateral effusions  -tele monitoring  -continue amiodarone, heparin gtt's for  Now -fentanyl gtt for sedation  -discontinue propofol  -never started on lidocaine -planned for VT ablation 10/18/19 with [facility] as outpatient    PAF on Xarelto  -hold home xarelto  -continue  heparin gtt -tele monitoring  -goal K+ >4, Mg+ >2 -resume mexiletine per Cardiology rec's  -? If BP will tolerate metoprolol currently  CAD, HLD -continue ASA, lipitor   AKI on CKD   -await AM labs -Trend BMP / urinary output -Replace electrolytes as indicated -Avoid nephrotoxic agents, ensure adequate renal perfusion -hold diuresis until am labs returned   Hypothyroidism  -continue synthroid   At Risk Malnutrition  -hold TF with likely extubation   Nausea / Vomiting  -? If related to TF vs ileus, assess KUB to r/o ileus -hold TF for possible extubation   Best practice:  Diet:  NPO Pain/Anxiety/Delirium protocol (if indicated): PAD protocol  VAP protocol (if indicated): yes  DVT prophylaxis: Heparin  GI prophylaxis: PPI  Glucose control: CBGs  Mobility: BR Code Status: FULL Family Communication: called Max Rivas (nephew, [phone]) for update 09/20/19 Disposition: ICU   Labs   CBC: Recent Labs  Lab 09/18/19 1247 09/18/19 1259 09/18/19 1750 09/19/19 0223  WBC 12.3*  --   --  7.8  NEUTROABS 9.3*  --   --   --   HGB 15.1 17.0 16.3 14.2  HCT 48.6 50.0 48.0 44.3  MCV 91.5  --   --  90.0  PLT 238  --   --  204    Basic Metabolic Panel: Recent Labs  Lab 09/18/19 1247 09/18/19 1259 09/18/19 1750 09/19/19 0223 09/19/19 0912 09/19/19 1640  NA 136 136 135 136  --   --   K 3.7 3.7 4.6 4.3  --   --   CL 96* 97*  --  99  --   --   CO2 25  --   --  25  --   --   GLUCOSE 219* 215*  --  159*  --   --   BUN 15 17  --  23  --   --   CREATININE 1.57* 1.40*  --  2.03*  --   --   CALCIUM 8.8*  --   --  8.8*  --   --   MG 2.0  --   --  2.4 2.3 2.4  PHOS  --   --   --  4.4 5.5* 4.6   GFR: Estimated Creatinine Clearance: 42.3 mL/min (A) (by C-G formula based on SCr of 2.03 mg/dL (H)). Recent Labs  Lab 09/18/19 1247 09/19/19 0223  WBC 12.3* 7.8    Liver Function Tests: Recent Labs  Lab 09/19/19 1345  PROT 6.7   No results for input(s): LIPASE, AMYLASE in the last 168 hours. No results for input(s): AMMONIA in the last 168 hours.  ABG    Component Value Date/Time   PHART 7.376 09/18/2019 1750   PCO2ART 50.6 (H) 09/18/2019 1750   PO2ART 298 (H) 09/18/2019 1750   HCO3 29.8 (H) 09/18/2019 1750   TCO2 31 09/18/2019 1750   ACIDBASEDEF 3.0 (H) 05/23/2019 1330   O2SAT 100.0 09/18/2019 1750     Coagulation Profile: No results for input(s): INR, PROTIME in the last 168 hours.  Cardiac Enzymes: No results for input(s): CKTOTAL, CKMB, CKMBINDEX, TROPONINI in the last 168 hours.  HbA1C: Hgb A1c MFr Bld  Date/Time Value Ref Range Status   09/10/2019 12:41 AM 6.2 (H) 4.8 - 5.6 % Final    Comment:    (NOTE) Pre diabetes:          5.7%-6.4%  Diabetes:              >6.4%  Glycemic control for   <7.0% adults with diabetes   05/23/2019 02:28 AM 6.3 (H) 4.8 - 5.6 % Final    Comment:    (NOTE) Pre diabetes:          5.7%-6.4% Diabetes:              >6.4% Glycemic control for   <7.0% adults with diabetes     CBG: Recent Labs  Lab 09/19/19 2002 09/20/19 0022 09/20/19 0428 09/20/19 0728  GLUCAP 142* 164* 144* 143*      CC Time: 33 minutes    Canary Brim, MSN, NP-C [facility] 09/20/2019, 8:18 AM   Please see [email or web address] for pager details.

## 2019-09-20 NOTE — Progress Notes (Signed)
eLink Physician-Brief Progress Note Patient Name: Max Rivas DOB: 1950-10-31 MRN: [id number]   Date of Service  09/20/2019  HPI/Events of Note  Notified of episode of non sustained VT during agitation with episode of vomiting. Given Fentanyl 25 mcg as part of the infusion orders No significant electrolyte abnormality noted  eICU Interventions  Ordered Fentanyl 25 pr pushes Placed feeding tube to low intermittent suction for now     Intervention Category Major Interventions: Arrhythmia - evaluation and management Intermediate Interventions: Other:  Darl Pikes 09/20/2019, 12:07 AM

## 2019-09-20 NOTE — Progress Notes (Signed)
ANTICOAGULATION CONSULT NOTE  Pharmacy Consult for heparin Indication: atrial fibrillation  Allergies  Allergen Reactions  . Crestor [Rosuvastatin Calcium] Other (See Comments)    Back spasms, but can tolerate it at a low dose now    Patient Measurements: Height: 5\' 11"  (180.3 cm) Weight: 98.9 kg (218 lb 0.6 oz) IBW/kg (Calculated) : 75.3 Heparin Dosing Weight: 95.8kg  Vital Signs: Temp: 99 F (37.2 C) (09/08 0731) Temp Source: Oral (09/08 0731) BP: 141/70 (09/08 0800) Pulse Rate: 69 (09/08 0845)  Labs: Recent Labs    09/18/19 1247 09/18/19 1247 09/18/19 1259 09/18/19 1259 09/18/19 1648 09/18/19 1750 09/19/19 0137 09/19/19 0223 09/19/19 0912 09/20/19 0931 09/20/19 0954  HGB 15.1   < > 17.0   < >  --  16.3  --  14.2  --   --  13.6  HCT 48.6   < > 50.0   < >  --  48.0  --  44.3  --   --  42.8  PLT 238  --   --   --   --   --   --  204  --   --  166  APTT  --   --   --   --   --   --    < > 140* 88* 80*  --   HEPARINUNFRC  --   --   --   --   --   --   --  >2.20*  --   --  >2.20*  CREATININE 1.57*   < > 1.40*  --   --   --   --  2.03*  --   --  1.61*  TROPONINIHS 15  --   --   --  79*  --   --   --   --   --   --    < > = values in this interval not displayed.    Estimated Creatinine Clearance: 53.3 mL/min (A) (by C-G formula based on SCr of 1.61 mg/dL (H)).   Medical History: Past Medical History:  Diagnosis Date  . Arthritis    "elbows, knees" (02/24/2016)  . Bronchitis 2006  . CAD (coronary artery disease)    a. s/p prior MIs - 1995 x 2, 1998; b. s/p prior LCX stenting; c. 04/2019 Cath: LM min irregs, LAD 65p/mi, D1 75, LCX 99ost/p, 45m (ISR), OM2 80, RCA/RPDA mod diff dzs; d. 05/2019 CABG x 3: LIMA->LAD, VG->Diag, VG->OM.  06/2019 COPD (chronic obstructive pulmonary disease) (HCC)    a. Remote tobacco-->on home O2.  Marland Kitchen GERD (gastroesophageal reflux disease)   . 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 05/2019 Echo: EF 40-45%, gr2 DD. Nl  RV size/fxn. Mildly dil LA. Mild MR.  . High cholesterol   . Ischemic cardiomyopathy    a. 05/2019 Echo: EF 40-45%.  . Morbid obesity (HCC)   . Myocardial infarction [facility]) ~ 1995 X 2;1998; 2000; 2004  . On home oxygen therapy    "2L w/activity" (02/24/2016)  . PAF (paroxysmal atrial fibrillation) (HCC)    a. CHA2DS2VASc = 4-->eliquis. Also on amio.  . Pulmonary embolism (HCC) 02/24/2016  . Sleep apnea    "have CPAP; can't tolerate it" (02/24/2016)  . Ventricular tachycardia (HCC)    a. 2005 s/p ICD; b. 03/2011 Device upgrade/lead exchange: MDT Protecta XT VR single lead AICD.    Medications:  Medications Prior to Admission  Medication Sig Dispense Refill Last Dose  . acetaminophen (  TYLENOL) 325 MG tablet Take 2 tablets (650 mg total) by mouth every 6 (six) hours as needed for mild pain (or Fever >/= 101). 30 tablet 0   . amiodarone (PACERONE) 200 MG tablet Take 1 tablet (200 mg total) by mouth daily. (Patient taking differently: Take 200 mg by mouth daily. Patient states taking once a day MD note from 8/20 states increase to bid) 30 tablet 1   . apixaban (ELIQUIS) 5 MG TABS tablet Take 5 mg by mouth 2 (two) times daily.     Marland Kitchen aspirin 81 MG chewable tablet Chew 1 tablet (81 mg total) by mouth daily. 90 tablet 3   . atorvastatin (LIPITOR) 80 MG tablet Take 1 tablet (80 mg total) by mouth daily. 90 tablet 3   . carboxymethylcellulose (REFRESH PLUS) 0.5 % SOLN Place 1 drop into both eyes at bedtime.     . Cholecalciferol (VITAMIN D3) 50 MCG (2000 UT) TABS Take 2,000 Units by mouth daily.     Marland Kitchen dextromethorphan-guaiFENesin (MUCINEX DM) 30-600 MG 12hr tablet Take 1 tablet by mouth 2 (two) times daily as needed for cough. (Patient not taking: Reported on 09/08/2019)     . furosemide (LASIX) 40 MG tablet Take 40-80 mg by mouth 2 (two) times daily. Take 2 tablets (80 mg) in the morning and Take 1 tablet (40 mg) at bedtime     . gabapentin (NEURONTIN) 100 MG capsule Take 2 capsules (200 mg total) by  mouth 2 (two) times daily. 60 capsule 1   . levalbuterol (XOPENEX HFA) 45 MCG/ACT inhaler Inhale 2 puffs into the lungs every 6 (six) hours as needed for wheezing or shortness of breath.     . levothyroxine (SYNTHROID) 75 MCG tablet Take 75 mcg by mouth daily before breakfast.     . metoprolol succinate (TOPROL-XL) 25 MG 24 hr tablet Take 12.5 mg by mouth daily.     Marland Kitchen mexiletine (MEXITIL) 150 MG capsule Take 150 mg by mouth 2 (two) times daily.     . mometasone (ASMANEX) 220 MCG/INH inhaler Inhale 2 puffs into the lungs 2 (two) times daily.      . montelukast (SINGULAIR) 10 MG tablet Take 10 mg by mouth daily.      . Multiple Vitamin (MULTIVITAMIN WITH MINERALS) TABS tablet Take 1 tablet by mouth daily.     . nitroGLYCERIN (NITROSTAT) 0.4 MG SL tablet Place 0.4 mg under the tongue every 5 (five) minutes as needed for chest pain.      Marland Kitchen omega-3 acid ethyl esters (LOVAZA) 1 g capsule Take 1 capsule (1 g total) by mouth 2 (two) times daily. 60 capsule 0   . omeprazole (PRILOSEC) 20 MG capsule Take 20 mg by mouth 2 (two) times daily before a meal.     . Propylene Glycol (SYSTANE BALANCE) 0.6 % SOLN Place 1 drop into both eyes See admin instructions. Place 1 drop into each eye four times a day and apply a warm compress for 10 minutes afterwards     . Tiotropium Bromide-Olodaterol (STIOLTO RESPIMAT) 2.5-2.5 MCG/ACT AERS Inhale 2 puffs into the lungs daily.        Assessment: 89 YOM admitted with increasing dyspnea. On apixaban at home for h/o Afib. Per patient's roommate, he took his dose of apixaban this AM.   aPTT this morning came back at 80, heparin level still remains elevated from DOAC, on 1200 units/hr. Hgb 13.6, plt 166. No s/sx of bleeding or infusion issues.  Goal of Therapy:  Heparin  level 0.3-0.7 units/ml aPTT 66-102 seconds Monitor platelets by anticoagulation protocol: Yes   Plan:  -Continue IV heparin infusion at 1200 units/hr -Monitor daily aPTT, HL and s/s of bleeding    Sherron Monday, PharmD, BCCCP Clinical Pharmacist  Phone: [phone] 09/20/2019 11:39 AM  Please check AMION for all [facility] phone numbers After 10:00 PM, call Main Pharmacy [phone]

## 2019-09-20 NOTE — Progress Notes (Addendum)
Electrophysiology Rounding Note  Patient Name: Max Rivas Date of Encounter: 09/20/2019  Primary Cardiologist: Sinclair Grooms, MD Electrophysiologist: Cristopher Peru, MD   Subjective   Pt awake and alert on vent.   Had episode of vomiting around tube overnight with tachycardia -> VT overnight 120s. Converted spontaneously. NSR this am.   Inpatient Medications    Scheduled Meds:  aspirin  81 mg Per Tube Daily   atorvastatin  80 mg Per Tube Daily   budesonide (PULMICORT) nebulizer solution  0.5 mg Nebulization BID   chlorhexidine gluconate (MEDLINE KIT)  15 mL Mouth Rinse BID   Chlorhexidine Gluconate Cloth  6 each Topical Daily   doxazosin  1 mg Per Tube Q12H   feeding supplement (PROSource TF)  45 mL Per Tube Daily   finasteride  5 mg Oral Daily   levothyroxine  75 mcg Per Tube QAC breakfast   mouth rinse  15 mL Mouth Rinse 10 times per day   [START ON 09/21/2019] methylPREDNISolone (SOLU-MEDROL) injection  40 mg Intravenous Daily   mexiletine  150 mg Per Tube BID   montelukast  10 mg Per Tube Daily   pantoprazole (PROTONIX) IV  40 mg Intravenous QHS   revefenacin  175 mcg Nebulization Daily   Continuous Infusions:  sodium chloride 250 mL (09/18/19 1533)   amiodarone 30 mg/hr (09/20/19 0900)   feeding supplement (VITAL AF 1.2 CAL) Stopped (09/19/19 1700)   fentaNYL infusion INTRAVENOUS Stopped (09/20/19 0013)   heparin 1,200 Units/hr (09/20/19 0900)   lactated ringers     PRN Meds: acetaminophen, albuterol, docusate sodium, fentaNYL, fentaNYL (SUBLIMAZE) injection, ondansetron (ZOFRAN) IV, polyethylene glycol   Vital Signs    Vitals:   09/20/19 0800 09/20/19 0815 09/20/19 0830 09/20/19 0845  BP: (!) 141/70     Pulse: 73 72 68 69  Resp: (!) 8 12 (!) 0 (!) 7  Temp:      TempSrc:      SpO2: 94% 96% 96% 96%  Weight:      Height:        Intake/Output Summary (Last 24 hours) at 09/20/2019 0930 Last data filed at 09/20/2019 0900 Gross per 24 hour  Intake 810.78  ml  Output 2810 ml  Net -1999.22 ml   Filed Weights   09/18/19 1236 09/19/19 0500 09/20/19 0500  Weight: 99.8 kg 101 kg 98.9 kg    Physical Exam    GEN- The patient is fatigued appearing, alert and oriented x 3 today.   HEENT - + ET tube in place Lungs- + mechnical breathing sounds.  Heart- Regular rate and rhythm, no murmurs, rubs or gallops GI- soft, NT, ND, + BS Extremities- no clubbing or cyanosis. No edema Skin- no rash or lesion Psych- Flat but appropriate affect.  Neuro- strength and sensation are intact  Labs    CBC Recent Labs    09/18/19 1247 09/18/19 1259 09/18/19 1750 09/19/19 0223  WBC 12.3*  --   --  7.8  NEUTROABS 9.3*  --   --   --   HGB 15.1   < > 16.3 14.2  HCT 48.6   < > 48.0 44.3  MCV 91.5  --   --  90.0  PLT 238  --   --  204   < > = values in this interval not displayed.   Basic Metabolic Panel Recent Labs    09/18/19 1247 09/18/19 1247 09/18/19 1259 09/18/19 1259 09/18/19 1750 09/19/19 0223 09/19/19 0223 09/19/19 0912 09/19/19  1640  NA 136   < > 136   < > 135 136  --   --   --   K 3.7   < > 3.7   < > 4.6 4.3  --   --   --   CL 96*   < > 97*  --   --  99  --   --   --   CO2 25  --   --   --   --  25  --   --   --   GLUCOSE 219*   < > 215*  --   --  159*  --   --   --   BUN 15   < > 17  --   --  23  --   --   --   CREATININE 1.57*   < > 1.40*  --   --  2.03*  --   --   --   CALCIUM 8.8*  --   --   --   --  8.8*  --   --   --   MG 2.0   < >  --   --   --  2.4   < > 2.3 2.4  PHOS  --   --   --   --   --  4.4   < > 5.5* 4.6   < > = values in this interval not displayed.   Liver Function Tests Recent Labs    09/19/19 1345  PROT 6.7   No results for input(s): LIPASE, AMYLASE in the last 72 hours. Cardiac Enzymes No results for input(s): CKTOTAL, CKMB, CKMBINDEX, TROPONINI in the last 72 hours.   Telemetry    NSR, overnight had coughing spell with N/V into SVT vs Sinus Tach. He then had several short runs of sustained VT that  converted spontaneously. (personally reviewed)  Radiology    DG Chest Port 1 View  Result Date: 09/20/2019 CLINICAL DATA:  Acute respiratory failure.  Intubation EXAM: PORTABLE CHEST 1 VIEW COMPARISON:  Yesterday FINDINGS: Endotracheal tube with tip halfway between the clavicular heads and carina. The enteric tube reaches the stomach. ICD/pacer leads from the left in unremarkable position. CABG and left atrial clipping. Unchanged pulmonary opacity which could be mild edema but is also partially chronic. There is hyperinflation and pleural based scarring at both bases with pleural calcifications on the right. IMPRESSION: Stable hardware positioning and aeration. Electronically Signed   By: Monte Fantasia M.D.   On: 09/20/2019 07:30   DG Chest Port 1 View  Result Date: 09/19/2019 CLINICAL DATA:  Hypoxia.  Status post thoracentesis EXAM: PORTABLE CHEST 1 VIEW COMPARISON:  September 19, 2019 study obtained earlier in the day FINDINGS: Endotracheal tube tip is 4.2 cm above the carina. Nasogastric tube tip and side port below the diaphragm. Pacemaker lead positions unchanged. No appreciable pneumothorax. Small pleural effusions noted bilaterally. Patchy airspace opacity noted in each mid and lower lung region. Heart is mildly enlarged. Left atrial appendage clamp present. No evident adenopathy. Old healed fracture left clavicle again noted. IMPRESSION: Tube positions as described. No evident pneumothorax. Small pleural effusions with airspace opacity bilaterally. Suspect pulmonary edema, although a degree of pneumonia superimposed cannot be excluded. Stable cardiac silhouette. Electronically Signed   By: Lowella Grip III M.D.   On: 09/19/2019 12:08   DG Chest Port 1 View  Result Date: 09/19/2019 CLINICAL DATA:  Intubation. EXAM: PORTABLE CHEST 1 VIEW COMPARISON:  09/18/2019.  CT 07/25/2018. FINDINGS: Endotracheal tube and NG tube in stable position. Cardiac pacer stable position. Prior median sternotomy.  Left atrial appendage clip in stable position. Cardiomegaly again noted. No pulmonary venous congestion. Stable prominent central pulmonary arteries. Left lower lobe atelectasis again noted. Diffuse bilateral pulmonary infiltrates/edema again noted. Bilateral pleural thickening/effusions again noted. Calcified pleural plaques again noted. IMPRESSION: 1.  Endotracheal tube and NG tube in stable position. 2. Cardiac pacer in stable position. Prior median sternotomy. Left atrial appendage clip in stable position. Cardiomegaly again noted. No pulmonary venous congestion. Prominent central pulmonary arteries again noted suggesting pulmonary hypertension. 3. Left lower lobe atelectasis again noted. Diffuse bilateral pulmonary infiltrates/edema again noted. Similar findings noted on prior exam. Bilateral pleural thickening/effusions again noted. Calcified pleural plaques, possibly from prior asbestos exposure, again noted Electronically Signed   By: Nicholson   On: 09/19/2019 06:26   DG Chest Portable 1 View  Result Date: 09/18/2019 CLINICAL DATA:  Intubation and orogastric tube placement. EXAM: PORTABLE CHEST 1 VIEW COMPARISON:  09/18/2019 at 12:58 p.m. FINDINGS: New endotracheal tube has its tip projecting 4 cm above the carina. Orogastric tube passes below the diaphragm into the proximal stomach. Bilateral interstitial and hazy airspace lung opacities are unchanged from the earlier exam. Additional opacity at the lung bases is likely due to small effusions with atelectasis, greater on the left, also unchanged. IMPRESSION: 1. Well-positioned endotracheal tube and nasal/orogastric tube as detailed. No other change from the earlier study. Electronically Signed   By: Lajean Manes M.D.   On: 09/18/2019 16:05   DG Chest Portable 1 View  Result Date: 09/18/2019 CLINICAL DATA:  Ventricular tachycardia. EXAM: PORTABLE CHEST 1 VIEW COMPARISON:  September 08, 2019 FINDINGS: Cardiac pacemaker in stable position.  Cardiomediastinal silhouette is stable. Bilateral pleural effusions. Increased interstitial markings superimposed on pre-existing pleural subpleural scarring. Osseous structures are without acute abnormality. Soft tissues are grossly normal. IMPRESSION: 1. Bilateral pleural effusions. 2. Increased interstitial markings superimposed on pre-existing pleural subpleural scarring, likely represent interstitial pulmonary edema. Electronically Signed   By: Fidela Salisbury M.D.   On: 09/18/2019 13:22   DG Abd Portable 1V  Result Date: 09/20/2019 CLINICAL DATA:  Vomiting EXAM: PORTABLE ABDOMEN - 1 VIEW COMPARISON:  None. FINDINGS: Nasogastric tube tip is in the proximal stomach with the side port at the gastroesophageal junction. There is moderate stool in the colon. There is no bowel dilatation or air-fluid level to suggest bowel obstruction. No free air. There is a small right pleural effusion with calcification along the right pleura. Pacemaker leads attached to right heart. IMPRESSION: Nasogastric tube tip in proximal stomach with side port at the gastroesophageal junction. It may be prudent to consider advancing nasogastric tube 4-5 cm. No bowel obstruction or free air. Small right pleural effusion noted. Electronically Signed   By: Lowella Grip III M.D.   On: 09/20/2019 09:20    Patient Profile     Max Rivas is a 69 y.o. 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 History of PAF on eliquis with recent [facility] 8/31 for afib.  History of single lead AICD and recurrent VT RX with oral amiodarone and mexiletine who is being seen today for the evaluation of VT with ICD shock at the request of Dr. Tamala Julian.  Assessment & Plan    1. Monomorphic VT Slow VT overnight in  setting of Nausea with vomiting around tube. Appears to have started as an SVT (p waves difficult to appreciate).  He is in NSR this am.   Continue IV amiodarone Continue mexitil per tube.  Would resume toprol as BP tolerates.  Labs pending this am.  Goal K > 3.9 and Mg > 1.9 Yesterday he was scheduled for outpatient VT ablation at [facility] for 10/18/2019 with Dr. Shirley Muscat, This appears to have been moved up to 09/26/2019. Will discuss with Dr. Lovena Le. This may involve hospital transfer depending on course.    2. Acute on chronic systolic CHF Echo 0/51/1021 LVEF 30-35%, Grade 1 DD, Mod RV dysfunction + effusions by CXR.  Volume status appears OK this am.  Follow closely. He is on lasix 80 mg q am and 40 mg q pm as outpatient.    3. Pleural effusion s/p thoracentesis with 1400 cc of rust colored appearing fluid removed from left pleural space 9/7   4. CAD s/p CABG 05/23/2019 Denies chest pain.  HS troponin negative.  No change.    5. Acute on chronic respiratory failure/COPD, on home 02 Diuresing.  WBC WNL CXR with bilateral effusions -> s/p thoracentesis as above. Remains intubated this am. Per CCM.  Weaning.   6. PAF Holding eliquis and covering with heparin with recent [facility] in the event he needs procedure this admission. Will discuss with MD, but suspect can transition back to Eliquis once he is extubated.   For questions or updates, please contact [place] Please consult [email or web address] for contact info under Cardiology/STEMI.  Signed, Shirley Friar, PA-C  09/20/2019, 9:30 AM   EP Attending  Patient seen and examined. He is progressing. Note the VT last night. Continue IV amiodarone. Note thoracentesis with large volume fluid removal. Extubate when able.   Carleene Overlie Kennieth Plotts,MD

## 2019-09-20 NOTE — Evaluation (Signed)
Physical Therapy Evaluation Patient Details Name: Max Rivas MRN: [id number] DOB: 12-04-1950 Today's Date: 09/20/2019   History of Present Illness  Pt is 69 yo male admitted on 9/6 with acute resp failure and required intubation.  Pt developed V tach and is s/p DCCV x 2 and amiodarone.  Pt intubated 9/6-09/20/19. Thoracentesis on 9/7 with 1.4 L removed. Chest Xray shows bil small effusions.  PMH for CAD, CABG, COPD, HF, cardiomyopathy, EF 40-45%.  Clinical Impression  Pt admitted with above diagnosis.  He was extubated earlier this morning and is on 5 LPM O2 (his baseline level).  Pt was able to transfer and ambulate household distances with supervision and no AD.  He demonstrated safe balance and transfers - only requiring supervision for line management.  Pt's VSS throughout session. Pt resides with a roommate in one level home and has necessary DME.  No further PT indicated - pt agrees.  Recommend ambulation with nursing.     Follow Up Recommendations No PT follow up;Supervision - Intermittent    Equipment Recommendations  None recommended by PT    Recommendations for Other Services       Precautions / Restrictions Precautions Precautions: None      Mobility  Bed Mobility Overal bed mobility: Needs Assistance Bed Mobility: Supine to Sit;Rolling Rolling: Independent   Supine to sit: HOB elevated;Supervision     General bed mobility comments: supervision for lines  Transfers Overall transfer level: Needs assistance Equipment used: None Transfers: Sit to/from Stand Sit to Stand: Min guard         General transfer comment: guarding for safety, lines and balance   Ambulation/Gait Ambulation/Gait assistance: Min guard;Supervision Gait Distance (Feet): 190 Feet Assistive device: None Gait Pattern/deviations: Step-through pattern Gait velocity: decreased   General Gait Details: min guard progressed to supervision; steady gait without LOB; reports near baseline except  decreased endurance, reports neuropathy in feet  Stairs            Wheelchair Mobility    Modified Rankin (Stroke Patients Only)       Balance Overall balance assessment: Needs assistance Sitting-balance support: No upper extremity supported Sitting balance-Leahy Scale: Good     Standing balance support: No upper extremity supported Standing balance-Leahy Scale: Good                               Pertinent Vitals/Pain Pain Assessment: No/denies pain    Home Living Family/patient expects to be discharged to:: Private residence Living Arrangements: Non-relatives/Friends Available Help at Discharge: Friend(s);Available 24 hours/day Type of Home: House Home Access: Stairs to enter Entrance Stairs-Rails: Right;Left;Can reach both Entrance Stairs-Number of Steps: 3 Home Layout: One level Home Equipment: Grab bars - tub/shower;Shower seat;Bedside commode;Hand held shower head Additional Comments: On 5 L O2 at home    Prior Function Level of Independence: Independent         Comments: Reports on 5 L O2 at home.  Independent with ADLs, IADLs, and community ambulation.  Does not use AD unless at grocery store holds onto cart.     Hand Dominance        Extremity/Trunk Assessment   Upper Extremity Assessment Upper Extremity Assessment: Overall WFL for tasks assessed    Lower Extremity Assessment Lower Extremity Assessment: Overall WFL for tasks assessed    Cervical / Trunk Assessment Cervical / Trunk Assessment: Normal  Communication   Communication: No difficulties  Cognition Arousal/Alertness: Awake/alert Behavior During  Therapy: WFL for tasks assessed/performed Overall Cognitive Status: Within Functional Limits for tasks assessed                                        General Comments General comments (skin integrity, edema, etc.): Pt on 5 LPM rest with sats 95%; on 6 LPM to ambulate (5 not option on tank) with sats 95%;  HR 77 bpm-95 bpm throughout session    Exercises     Assessment/Plan    PT Assessment Patent does not need any further PT services  PT Problem List         PT Treatment Interventions      PT Goals (Current goals can be found in the Care Plan section)  Acute Rehab PT Goals Patient Stated Goal: return home PT Goal Formulation: All assessment and education complete, DC therapy    Frequency     Barriers to discharge        Co-evaluation               AM-PAC PT "6 Clicks" Mobility  Outcome Measure Help needed turning from your back to your side while in a flat bed without using bedrails?: None Help needed moving from lying on your back to sitting on the side of a flat bed without using bedrails?: None Help needed moving to and from a bed to a chair (including a wheelchair)?: None Help needed standing up from a chair using your arms (e.g., wheelchair or bedside chair)?: None Help needed to walk in hospital room?: None Help needed climbing 3-5 steps with a railing? : None 6 Click Score: 24    End of Session Equipment Utilized During Treatment: Oxygen Activity Tolerance: Patient tolerated treatment well Patient left: in chair;with call bell/phone within reach Nurse Communication: Mobility status      Time: 1540-1603 PT Time Calculation (min) (ACUTE ONLY): 23 min   Charges:   PT Evaluation $PT Eval Moderate Complexity: 1 Mod          Aleli Navedo, PT Acute Rehab Services Pager [phone] [facility] [phone]    Rayetta Humphrey 09/20/2019, 4:13 PM

## 2019-09-21 ENCOUNTER — Inpatient Hospital Stay (HOSPITAL_COMMUNITY): Payer: No Typology Code available for payment source

## 2019-09-21 DIAGNOSIS — I472 Ventricular tachycardia: Secondary | ICD-10-CM

## 2019-09-21 LAB — CBC
HCT: 41.1 % (ref 39.0–52.0)
Hemoglobin: 13 g/dL (ref 13.0–17.0)
MCH: 28.8 pg (ref 26.0–34.0)
MCHC: 31.6 g/dL (ref 30.0–36.0)
MCV: 90.9 fL (ref 80.0–100.0)
Platelets: 142 10*3/uL — ABNORMAL LOW (ref 150–400)
RBC: 4.52 MIL/uL (ref 4.22–5.81)
RDW: 16.8 % — ABNORMAL HIGH (ref 11.5–15.5)
WBC: 8.9 10*3/uL (ref 4.0–10.5)
nRBC: 0 % (ref 0.0–0.2)

## 2019-09-21 LAB — GLUCOSE, CAPILLARY
Glucose-Capillary: 105 mg/dL — ABNORMAL HIGH (ref 70–99)
Glucose-Capillary: 111 mg/dL — ABNORMAL HIGH (ref 70–99)
Glucose-Capillary: 112 mg/dL — ABNORMAL HIGH (ref 70–99)
Glucose-Capillary: 116 mg/dL — ABNORMAL HIGH (ref 70–99)
Glucose-Capillary: 130 mg/dL — ABNORMAL HIGH (ref 70–99)
Glucose-Capillary: 166 mg/dL — ABNORMAL HIGH (ref 70–99)

## 2019-09-21 LAB — BASIC METABOLIC PANEL
Anion gap: 9 (ref 5–15)
BUN: 29 mg/dL — ABNORMAL HIGH (ref 8–23)
CO2: 29 mmol/L (ref 22–32)
Calcium: 8.6 mg/dL — ABNORMAL LOW (ref 8.9–10.3)
Chloride: 99 mmol/L (ref 98–111)
Creatinine, Ser: 1.4 mg/dL — ABNORMAL HIGH (ref 0.61–1.24)
GFR calc Af Amer: 60 mL/min — ABNORMAL LOW (ref >60)
GFR calc non Af Amer: 52 mL/min — ABNORMAL LOW (ref >60)
Glucose, Bld: 115 mg/dL — ABNORMAL HIGH (ref 70–99)
Potassium: 4.4 mmol/L (ref 3.5–5.1)
Sodium: 137 mmol/L (ref 135–145)

## 2019-09-21 LAB — PHOSPHORUS: Phosphorus: 3.8 mg/dL (ref 2.5–4.6)

## 2019-09-21 LAB — MAGNESIUM: Magnesium: 2.5 mg/dL — ABNORMAL HIGH (ref 1.7–2.4)

## 2019-09-21 LAB — HEPARIN LEVEL (UNFRACTIONATED): Heparin Unfractionated: 1.6 IU/mL — ABNORMAL HIGH (ref 0.30–0.70)

## 2019-09-21 LAB — APTT: aPTT: 65 seconds — ABNORMAL HIGH (ref 24–36)

## 2019-09-21 MED ORDER — PREDNISONE 20 MG PO TABS
40.0000 mg | ORAL_TABLET | Freq: Every day | ORAL | Status: DC
  Administered 2019-09-22 – 2019-09-23 (×2): 40 mg via ORAL
  Filled 2019-09-21 (×2): qty 2

## 2019-09-21 MED ORDER — SENNOSIDES-DOCUSATE SODIUM 8.6-50 MG PO TABS
1.0000 | ORAL_TABLET | Freq: Two times a day (BID) | ORAL | Status: DC
  Administered 2019-09-21 – 2019-09-23 (×3): 1 via ORAL
  Filled 2019-09-21 (×5): qty 1

## 2019-09-21 MED ORDER — CHLORHEXIDINE GLUCONATE 0.12 % MT SOLN
OROMUCOSAL | Status: AC
  Administered 2019-09-21: 15 mL
  Filled 2019-09-21: qty 15

## 2019-09-21 MED ORDER — ARFORMOTEROL TARTRATE 15 MCG/2ML IN NEBU
15.0000 ug | INHALATION_SOLUTION | Freq: Two times a day (BID) | RESPIRATORY_TRACT | Status: DC
  Administered 2019-09-21 – 2019-09-22 (×3): 15 ug via RESPIRATORY_TRACT
  Filled 2019-09-21 (×3): qty 2

## 2019-09-21 MED ORDER — APIXABAN 5 MG PO TABS
5.0000 mg | ORAL_TABLET | Freq: Two times a day (BID) | ORAL | Status: DC
  Administered 2019-09-21 – 2019-09-23 (×5): 5 mg via ORAL
  Filled 2019-09-21 (×5): qty 1

## 2019-09-21 MED ORDER — POLYETHYLENE GLYCOL 3350 17 G PO PACK
17.0000 g | PACK | Freq: Every day | ORAL | Status: DC
  Administered 2019-09-21: 17 g via ORAL
  Filled 2019-09-21: qty 1

## 2019-09-21 MED ORDER — METHYLPREDNISOLONE SODIUM SUCC 40 MG IJ SOLR: 40.0000 mg | Freq: Every day | INTRAMUSCULAR | Status: DC

## 2019-09-21 MED ORDER — AMIODARONE HCL 200 MG PO TABS
200.0000 mg | ORAL_TABLET | Freq: Every day | ORAL | Status: DC
  Administered 2019-09-21 – 2019-09-23 (×3): 200 mg via ORAL
  Filled 2019-09-21 (×3): qty 1

## 2019-09-21 MED ORDER — FUROSEMIDE 10 MG/ML IJ SOLN
40.0000 mg | Freq: Two times a day (BID) | INTRAMUSCULAR | Status: DC
  Administered 2019-09-21 – 2019-09-23 (×5): 40 mg via INTRAVENOUS
  Filled 2019-09-21 (×5): qty 4

## 2019-09-21 MED ORDER — POTASSIUM CHLORIDE CRYS ER 20 MEQ PO TBCR
30.0000 meq | EXTENDED_RELEASE_TABLET | Freq: Once | ORAL | Status: AC
  Administered 2019-09-21: 30 meq via ORAL
  Filled 2019-09-21: qty 1

## 2019-09-21 NOTE — Progress Notes (Signed)
Occupational Therapy Evaluation Patient Details Name: Max Rivas MRN: [id number] DOB: [birth year] Today's Date: 09/21/2019    History of Present Illness Pt is 69 yo male admitted on 9/6 with acute resp failure and required intubation.  Pt developed V tach and is s/p DCCV x 2 and amiodarone.  Pt intubated 9/6-09/20/19. Thoracentesis on 9/7 with 1.4 L removed. Chest Xray shows bil small effusions.  PMH for CAD, CABG, COPD, HF, cardiomyopathy, EF 40-45%.   Clinical Impression   Pt fatigues with activity however able to complete ADL and mobility @ independent level. Educated on energy conservation and strategies to reduce risk of falls. Pt verbalized understanding. VSS. No further OT needed.     Follow Up Recommendations  No OT follow up;Supervision - Intermittent    Equipment Recommendations  None recommended by OT    Recommendations for Other Services       Precautions / Restrictions Precautions Precautions: None      Mobility Bed Mobility Overal bed mobility: Independent                Transfers Overall transfer level: Independent                    Balance Overall balance assessment: No apparent balance deficits (not formally assessed)                                         ADL either performed or assessed with clinical judgement   ADL Overall ADL's : At baseline                                       General ADL Comments: Educated on energy conservation strateiges. Pt able to verbalize strategies he uses at home. Educated on pacing, prioritizing and using pursed lip breathing techniques Pt uses showerchair at baseline     Vision         Perception     Praxis      Pertinent Vitals/Pain Pain Assessment: No/denies pain     Hand Dominance Right   Extremity/Trunk Assessment Upper Extremity Assessment Upper Extremity Assessment: Overall WFL for tasks assessed   Lower Extremity Assessment Lower Extremity  Assessment: Defer to PT evaluation   Cervical / Trunk Assessment Cervical / Trunk Assessment: Normal   Communication Communication Communication: No difficulties   Cognition Arousal/Alertness: Awake/alert Behavior During Therapy: WFL for tasks assessed/performed Overall Cognitive Status: Within Functional Limits for tasks assessed                                     General Comments       Exercises     Shoulder Instructions      Home Living Family/patient expects to be discharged to:: Private residence Living Arrangements: Non-relatives/Friends Available Help at Discharge: Friend(s);Available 24 hours/day Type of Home: House Home Access: Stairs to enter [employer] of Steps: 3 Entrance Stairs-Rails: Right;Left;Can reach both Home Layout: One level     Bathroom Shower/Tub: Producer, television/film/video: Handicapped height     Home Equipment: Grab bars - tub/shower;Shower seat;Bedside commode;Hand held shower head   Additional Comments: On 5 L O2 at home      Prior Functioning/Environment Level of Independence:  Independent        Comments: Reports on 5 L O2 at home with 50 ft extension.  Independent with ADLs, IADLs, and community ambulation.  Does not use AD unless at grocery store holds onto cart.        OT Problem List: Decreased activity tolerance      OT Treatment/Interventions: Self-care/ADL training    OT Goals(Current goals can be found in the care plan section) Acute Rehab OT Goals Patient Stated Goal: return home and stay alive OT Goal Formulation: All assessment and education complete, DC therapy  OT Frequency:     Barriers to D/C:            Co-evaluation              AM-PAC OT "6 Clicks" Daily Activity     Outcome Measure Help from another person eating meals?: None Help from another person taking care of personal grooming?: None Help from another person toileting, which includes using toliet,  bedpan, or urinal?: None Help from another person bathing (including washing, rinsing, drying)?: None Help from another person to put on and taking off regular upper body clothing?: None Help from another person to put on and taking off regular lower body clothing?: None 6 Click Score: 24   End of Session Equipment Utilized During Treatment: Oxygen Nurse Communication: Mobility status  Activity Tolerance: Patient tolerated treatment well Patient left: in chair;with call bell/phone within reach  OT Visit Diagnosis: Muscle weakness (generalized) (M62.81)                Time: 0973-5329 OT Time Calculation (min): 17 min Charges:  OT General Charges $OT Visit: 1 Visit OT Evaluation $OT Eval Low Complexity: 1 Low  Bri Wakeman, OT/L   Acute OT Clinical Specialist Acute Rehabilitation Services Pager [phone] Office [phone]   [facility] 09/21/2019, 4:00 PM

## 2019-09-21 NOTE — Progress Notes (Signed)
Pharmacist Heart Failure Core Measure Documentation  Assessment: Max Rivas has an EF documented as 30- 35% on 8/28 by TTE.  Rationale: Heart failure patients with left ventricular systolic dysfunction (LVSD) and an EF < 40% should be prescribed an angiotensin converting enzyme inhibitor (ACEI) or angiotensin receptor blocker (ARB) at discharge unless a contraindication is documented in the medical record.  This patient is not currently on an ACEI or ARB for HF.  This note is being placed in the record in order to provide documentation that a contraindication to the use of these agents is present for this encounter.  ACE Inhibitor or Angiotensin Receptor Blocker is contraindicated (specify all that apply)  []   ACEI allergy AND ARB allergy []   Angioedema []   Moderate or severe aortic stenosis []   Hyperkalemia [x]   Hypotension []   Renal artery stenosis [x]   Worsening renal function, preexisting renal disease or dysfunction  , PharmD, BCPS, BCCP Clinical Pharmacist  Please check AMION for all [facility] phone numbers After 10:00 PM, call Main Pharmacy [phone]

## 2019-09-21 NOTE — Progress Notes (Signed)
  Speech Language Pathology Treatment: Dysphagia  Patient Details Name: Max Rivas MRN: [id number] DOB: March 28, 1950 Today's Date: 09/21/2019 Time: 1833-5825 SLP Time Calculation (min) (ACUTE ONLY): 12 min  Assessment / Plan / Recommendation Clinical Impression  Pt was seen for dysphagia treatment and was cooperative throughout the session. Pt's diet was advanced today to regular consistency and pt indicated that he tolerated lunch without difficulty. Pt tolerated puree solids, regular texture solids, and thin liquids via cup and straw using individual and consecutive swallows without symptoms of oropharyngeal dysphagia. It is recommended that the current diet be continued. Further skilled SLP services are not clinically indicated at this time.    HPI HPI: Max Rivas is a 69 y.o. male admitted to [facility] 09/18/19 with acute on chronic respiratory failure requiring ETT 9/6-9/8. PMH significant for GERD, COPD, CAD, sleep apnea, myocardial infarction, pulmonary embolism and pt is on home O2 at baseline.      SLP Plan  Discharge SLP treatment due to (comment);All goals met       Recommendations  Diet recommendations: Regular;Thin liquid Liquids provided via: Cup;Straw Medication Administration: Whole meds with liquid Supervision: Patient able to self feed Compensations: Slow rate;Small sips/bites Postural Changes and/or Swallow Maneuvers: Seated upright 90 degrees                Oral Care Recommendations: Oral care BID Follow up Recommendations: None SLP Visit Diagnosis: Dysphagia, unspecified (R13.10) Plan: Discharge SLP treatment due to (comment);All goals met       Max Rivas I. Max Rivas, [place], Max Rivas Office number [phone] Pager [place] 09/21/2019, 4:58 PM

## 2019-09-21 NOTE — Progress Notes (Signed)
ANTICOAGULATION CONSULT NOTE  Pharmacy Consult for heparin>> apixaban Indication: atrial fibrillation  Allergies  Allergen Reactions  . Crestor [Rosuvastatin Calcium] Other (See Comments)    Back spasms, but can tolerate it at a low dose now    Patient Measurements: Height: 5\' 11"  (180.3 cm) Weight: 100.1 kg (220 lb 10.9 oz) IBW/kg (Calculated) : 75.3 Heparin Dosing Weight: 95.8kg  Vital Signs: Temp: 97.6 F (36.4 C) (09/09 0635) Temp Source: Oral (09/09 0635) BP: 114/88 (09/09 0700) Pulse Rate: 73 (09/09 0700)  Labs: Recent Labs    09/18/19 1247 09/18/19 1259 09/18/19 1648 09/18/19 1750 09/19/19 0223 09/19/19 0223 09/19/19 0912 09/20/19 0931 09/20/19 0954 09/21/19 0047  HGB 15.1   < >  --    < > 14.2   < >  --   --  13.6 13.0  HCT 48.6   < >  --    < > 44.3  --   --   --  42.8 41.1  PLT 238   < >  --   --  204  --   --   --  166 142*  APTT  --   --   --    < > 140*   < > 88* 80*  --  65*  HEPARINUNFRC  --   --   --   --  >2.20*  --   --   --  >2.20* 1.60*  CREATININE 1.57*   < >  --   --  2.03*  --   --   --  1.61* 1.40*  TROPONINIHS 15  --  43*  --   --   --   --   --   --   --    < > = values in this interval not displayed.    Estimated Creatinine Clearance: 61.7 mL/min (A) (by C-G formula based on SCr of 1.4 mg/dL (H)).   Medical History: Past Medical History:  Diagnosis Date  . Arthritis    "elbows, knees" (02/24/2016)  . Bronchitis 2006  . CAD (coronary artery disease)    a. s/p prior MIs - 1995 x 2, 1998; b. s/p prior LCX stenting; c. 04/2019 Cath: LM min irregs, LAD 65p/mi, D1 75, LCX 99ost/p, 49m (ISR), OM2 80, RCA/RPDA mod diff dzs; d. 05/2019 CABG x 3: LIMA->LAD, VG->Diag, VG->OM.  06/2019 COPD (chronic obstructive pulmonary disease) (HCC)    a. Remote tobacco-->on home O2.  Marland Kitchen GERD (gastroesophageal reflux disease)   . 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 05/2019 Echo: EF 40-45%, gr2 DD. Nl RV size/fxn. Mildly dil LA. Mild  MR.  . High cholesterol   . Ischemic cardiomyopathy    a. 05/2019 Echo: EF 40-45%.  . Morbid obesity (HCC)   . Myocardial infarction [facility]) ~ 1995 X 2;1998; 2000; 2004  . On home oxygen therapy    "2L w/activity" (02/24/2016)  . PAF (paroxysmal atrial fibrillation) (HCC)    a. CHA2DS2VASc = 4-->eliquis. Also on amio.  . Pulmonary embolism (HCC) 02/24/2016  . Sleep apnea    "have CPAP; can't tolerate it" (02/24/2016)  . Ventricular tachycardia (HCC)    a. 2005 s/p ICD; b. 03/2011 Device upgrade/lead exchange: MDT Protecta XT VR single lead AICD.    Medications:  Medications Prior to Admission  Medication Sig Dispense Refill Last Dose  . acetaminophen (TYLENOL) 325 MG tablet Take 2 tablets (650 mg total) by mouth every 6 (six) hours as needed for mild pain (  or Fever >/= 101). 30 tablet 0   . amiodarone (PACERONE) 200 MG tablet Take 1 tablet (200 mg total) by mouth daily. (Patient taking differently: Take 200 mg by mouth daily. Patient states taking once a day MD note from 8/20 states increase to bid) 30 tablet 1   . apixaban (ELIQUIS) 5 MG TABS tablet Take 5 mg by mouth 2 (two) times daily.     Marland Kitchen aspirin 81 MG chewable tablet Chew 1 tablet (81 mg total) by mouth daily. 90 tablet 3   . atorvastatin (LIPITOR) 80 MG tablet Take 1 tablet (80 mg total) by mouth daily. 90 tablet 3   . carboxymethylcellulose (REFRESH PLUS) 0.5 % SOLN Place 1 drop into both eyes at bedtime.     . Cholecalciferol (VITAMIN D3) 50 MCG (2000 UT) TABS Take 2,000 Units by mouth daily.     Marland Kitchen dextromethorphan-guaiFENesin (MUCINEX DM) 30-600 MG 12hr tablet Take 1 tablet by mouth 2 (two) times daily as needed for cough. (Patient not taking: Reported on 09/08/2019)     . furosemide (LASIX) 40 MG tablet Take 40-80 mg by mouth 2 (two) times daily. Take 2 tablets (80 mg) in the morning and Take 1 tablet (40 mg) at bedtime     . gabapentin (NEURONTIN) 100 MG capsule Take 2 capsules (200 mg total) by mouth 2 (two) times daily. 60  capsule 1   . levalbuterol (XOPENEX HFA) 45 MCG/ACT inhaler Inhale 2 puffs into the lungs every 6 (six) hours as needed for wheezing or shortness of breath.     . levothyroxine (SYNTHROID) 75 MCG tablet Take 75 mcg by mouth daily before breakfast.     . metoprolol succinate (TOPROL-XL) 25 MG 24 hr tablet Take 12.5 mg by mouth daily.     Marland Kitchen mexiletine (MEXITIL) 150 MG capsule Take 150 mg by mouth 2 (two) times daily.     . mometasone (ASMANEX) 220 MCG/INH inhaler Inhale 2 puffs into the lungs 2 (two) times daily.      . montelukast (SINGULAIR) 10 MG tablet Take 10 mg by mouth daily.      . Multiple Vitamin (MULTIVITAMIN WITH MINERALS) TABS tablet Take 1 tablet by mouth daily.     . nitroGLYCERIN (NITROSTAT) 0.4 MG SL tablet Place 0.4 mg under the tongue every 5 (five) minutes as needed for chest pain.      Marland Kitchen omega-3 acid ethyl esters (LOVAZA) 1 g capsule Take 1 capsule (1 g total) by mouth 2 (two) times daily. 60 capsule 0   . omeprazole (PRILOSEC) 20 MG capsule Take 20 mg by mouth 2 (two) times daily before a meal.     . Propylene Glycol (SYSTANE BALANCE) 0.6 % SOLN Place 1 drop into both eyes See admin instructions. Place 1 drop into each eye four times a day and apply a warm compress for 10 minutes afterwards     . Tiotropium Bromide-Olodaterol (STIOLTO RESPIMAT) 2.5-2.5 MCG/ACT AERS Inhale 2 puffs into the lungs daily.        Assessment: 24 YOM admitted with increasing dyspnea. On apixaban at home for h/o Afib. Per patient's roommate, he took his dose of apixaban AM of admission.   aPTT this morning came back slightly subtherapeutic at 65, heparin level still remains elevated from DOAC at 1.6, on 1200 units/hr. Hgb 13, plt 142. No s/sx of bleeding or infusion issues.  Will transition back to apixaban.   Goal of Therapy:  Heparin level 0.3-0.7 units/ml aPTT 66-102 seconds Monitor platelets by  anticoagulation protocol: Yes   Plan:  -Stop heparin infusion -Start apixaban 5 mg BID tonight   -Monitor daily CBC and s/s of bleeding   Sherron Monday, PharmD, BCCCP Clinical Pharmacist  Phone: [phone] 09/21/2019 9:47 AM  Please check AMION for all [facility] phone numbers After 10:00 PM, call Main Pharmacy [phone]

## 2019-09-21 NOTE — Progress Notes (Addendum)
NAME:  Max Rivas, MRN:  [id number], DOB:  1950-05-10, LOS: 3 ADMISSION DATE:  09/18/2019, CONSULTATION DATE:  09/18/2019 REFERRING MD:  Dr, Stevie Kern, CHIEF COMPLAINT:  SOB   Brief History   69 y/o M, admitted 9/6 with acute respiratory failure, refused BiPAP, failed NRB and required intubation.  Developed VT in ER s/p DCCV x2 + amiodarone.  CXR showed bilateral small effusions.   PMH CAD, CABG, COPD, former smoker, chronic systolic HF, ischemic cardiomyopathy, EF 40-45%.    Past Medical History  CAD - s/p MI, CABG HFmrEF Ischemic Cardiomyopathy - 05/2019 EF 40-45%  VT - s/p ICD PAF - on Eliquis + amiodarone PE OSA Tobacco Abuse COPD - on home O2 Morbid Obesity   Significant Hospital Events   9/06 Admit with respiratory failure, intubated, monomorphic VT X DCCV + Amio 9/07 PSV wean, thoracentesis with 1.4L fluid removed 9/08 Monomorphic VT overnight with vomiting episode, PSV wean, extubated  Consults:  Cardiology  PCCM   Procedures:  ETT 9/6 >> 9/8  Significant Diagnostic Tests:  9/06 DCCV 9/07 Left Thoracentesis >> LD78, protein <3, TNC 405, lymph 71  Micro Data:  COVID 9/6 >> negative  MRSA PCR 9/6 >> negative  L Pleural Culture 9/7 >>  L Pleural Cytology 9/7 >> reactive mesothelial cells, numerous lymphoid cells  Antimicrobials:     Interim history/subjective:  Pt tolerated extubation, up to chair  Afebrile I/O 1.1L UOP, -737 in last 24 hours  Objective   Blood pressure 114/88, pulse 73, temperature 97.6 F (36.4 C), temperature source Oral, resp. rate 20, height 5\' 11"  (1.803 m), weight 100.1 kg, SpO2 96 %.        Intake/Output Summary (Last 24 hours) at 09/21/2019 11/21/2019 Last data filed at 09/21/2019 0200 Gross per 24 hour  Intake 358.51 ml  Output 1075 ml  Net -716.49 ml   Filed Weights   09/19/19 0500 09/20/19 0500 09/21/19 0500  Weight: 101 kg 98.9 kg 100.1 kg    Examination: General: adult male sitting up in chair in NAD HEENT: MM  pink/moist, anicteric  Neuro: AAOx4, speech clear, MAE  CV: s1s2 RRR, no m/r/g PULM: non-labored on [place] O2, lungs diminished posterior R>L but good air movement  GI: soft, bsx4 active  Extremities: warm/dry, trace to 1+ edema  Skin: no rashes or lesions  Resolved Hospital Problem list     Assessment & Plan:   AECOPD Acute Hypoxemic & Hypercarbic Respiratory Failure  Bilateral Pleural Effusions, Proven Transudate on Left  Baseline O2 5L -wean O2 for sats 88-95% -follow intermittent CXR -continue pulmicort + yupelri  -solumedrol 40 mg QD -note he has refused bipap during admit, elected for intubation due to claustrophobia  -follow pleural culture, cytology negative   Monomorphic VT  Ischemic cardiomyopathy at baseline  Acute on Chronic Systolic Heart failure  Bilateral effusions  -tele monitoring  -continue IV amiodarone with transition to oral amiodarone per Cardiology  -continue mexitil  -resume lopressor when BP will permit  -pending ablation at [facility] 9/14  -monitor volume status closely, diuresis has been on hold during admit.  Resume lasix 40 mg BID (reduced home dose) and monitor renal function / BP closely   PAF on Xarelto  -continue heparin gtt -ok to resume eliquis per Cardiology -goal K >4, Mg > 2  CAD s/p CABG 05/2019, HLD -ASA, lipitor   AKI on CKD   -Trend BMP / urinary output -Replace electrolytes as indicated -Avoid nephrotoxic agents, ensure adequate renal perfusion  Hypothyroidism  -  continue synthroid   At Risk Malnutrition  -advance diet as tolerated   Nausea / Vomiting  KUB negative for ileus, likely related to TF, constipation  -PRN antiemetics   Best practice:  Diet: advance diet as tolerated Pain/Anxiety/Delirium protocol (if indicated): n/a VAP protocol (if indicated): yes  DVT prophylaxis: Transition to home Eliquis  GI prophylaxis: PPI  Glucose control: CBGs  Mobility: BR Code Status: FULL Family Communication: [name]  (nephew, [phone]) for update 09/20/19. Patient updated on plan of care 9/9.   Disposition: Transfer to cardiac progressive care, to [facility] as of 9/10  Labs   CBC: Recent Labs  Lab 09/18/19 1247 09/18/19 1247 09/18/19 1259 09/18/19 1750 09/19/19 0223 09/20/19 0954 09/21/19 0047  WBC 12.3*  --   --   --  7.8 10.9* 8.9  NEUTROABS 9.3*  --   --   --   --   --   --   HGB 15.1   < > 17.0 16.3 14.2 13.6 13.0  HCT 48.6   < > 50.0 48.0 44.3 42.8 41.1  MCV 91.5  --   --   --  90.0 89.7 90.9  PLT 238  --   --   --  204 166 142*   < > = values in this interval not displayed.    Basic Metabolic Panel: Recent Labs  Lab 09/18/19 1247 09/18/19 1247 09/18/19 1259 09/18/19 1750 09/19/19 0223 09/19/19 0912 09/19/19 1640 09/20/19 0954 09/21/19 0047  NA 136   < > 136 135 136  --   --  135 137  K 3.7   < > 3.7 4.6 4.3  --   --  3.7 4.4  CL 96*  --  97*  --  99  --   --  96* 99  CO2 25  --   --   --  25  --   --  26 29  GLUCOSE 219*  --  215*  --  159*  --   --  147* 115*  BUN 15  --  17  --  23  --   --  37* 29*  CREATININE 1.57*  --  1.40*  --  2.03*  --   --  1.61* 1.40*  CALCIUM 8.8*  --   --   --  8.8*  --   --  8.8* 8.6*  MG 2.0   < >  --   --  2.4 2.3 2.4 2.3 2.5*  PHOS  --   --   --   --  4.4 5.5* 4.6 4.1 3.8   < > = values in this interval not displayed.   GFR: Estimated Creatinine Clearance: 61.7 mL/min (A) (by C-G formula based on SCr of 1.4 mg/dL (H)). Recent Labs  Lab 09/18/19 1247 09/19/19 0223 09/20/19 0954 09/21/19 0047  WBC 12.3* 7.8 10.9* 8.9    Liver Function Tests: Recent Labs  Lab 09/19/19 1345  PROT 6.7   No results for input(s): LIPASE, AMYLASE in the last 168 hours. No results for input(s): AMMONIA in the last 168 hours.  ABG    Component Value Date/Time   PHART 7.376 09/18/2019 1750   PCO2ART 50.6 (H) 09/18/2019 1750   PO2ART 298 (H) 09/18/2019 1750   HCO3 29.8 (H) 09/18/2019 1750   TCO2 31 09/18/2019 1750   ACIDBASEDEF 3.0 (H) 05/23/2019  1330   O2SAT 100.0 09/18/2019 1750     Coagulation Profile: No results for input(s): INR, PROTIME in the last  168 hours.  Cardiac Enzymes: No results for input(s): CKTOTAL, CKMB, CKMBINDEX, TROPONINI in the last 168 hours.  HbA1C: Hgb A1c MFr Bld  Date/Time Value Ref Range Status  09/10/2019 12:41 AM 6.2 (H) 4.8 - 5.6 % Final    Comment:    (NOTE) Pre diabetes:          5.7%-6.4%  Diabetes:              >6.4%  Glycemic control for   <7.0% adults with diabetes   05/23/2019 02:28 AM 6.3 (H) 4.8 - 5.6 % Final    Comment:    (NOTE) Pre diabetes:          5.7%-6.4% Diabetes:              >6.4% Glycemic control for   <7.0% adults with diabetes     CBG: Recent Labs  Lab 09/20/19 1518 09/20/19 2002 09/21/19 0037 09/21/19 0403 09/21/19 0634  GLUCAP 178* 143* 112* 116* 111*      CC Time: n/a  Canary Brim, MSN, NP-C [facility] 09/21/2019, 8:08 AM   Please see [email or web address] for pager details.

## 2019-09-21 NOTE — Progress Notes (Addendum)
Electrophysiology Rounding Note  Patient Name: Max Rivas Date of Encounter: 09/21/2019  Primary Cardiologist: Sinclair Grooms, MD Electrophysiologist: Cristopher Peru, MD   Subjective   The patient is doing well today.  Extubated, up in chair and feeling well.  Inpatient Medications    Scheduled Meds:  amiodarone  200 mg Oral Daily   aspirin  81 mg Oral Daily   atorvastatin  80 mg Oral Daily   budesonide (PULMICORT) nebulizer solution  0.5 mg Nebulization BID   chlorhexidine gluconate (MEDLINE KIT)  15 mL Mouth Rinse BID   Chlorhexidine Gluconate Cloth  6 each Topical Daily   feeding supplement (PROSource TF)  45 mL Per Tube Daily   levothyroxine  75 mcg Oral QAC breakfast   mouth rinse  15 mL Mouth Rinse BID   methylPREDNISolone (SOLU-MEDROL) injection  40 mg Intravenous Daily   mexiletine  150 mg Oral BID   montelukast  10 mg Oral Daily   revefenacin  175 mcg Nebulization Daily   tamsulosin  0.4 mg Oral QPC supper   Continuous Infusions:  sodium chloride 250 mL (09/18/19 1533)   amiodarone 30 mg/hr (09/21/19 0200)   feeding supplement (VITAL AF 1.2 CAL) Stopped (09/19/19 1700)   heparin 1,200 Units/hr (09/20/19 1800)   lactated ringers     PRN Meds: acetaminophen, albuterol, docusate sodium, fentaNYL (SUBLIMAZE) injection, ondansetron (ZOFRAN) IV, polyethylene glycol   Vital Signs    Vitals:   09/21/19 0500 09/21/19 0600 09/21/19 0635 09/21/19 0700  BP: 105/60 99/61  114/88  Pulse: 61 66  73  Resp: _0 Temp:   97.6 F (36.4 C)   TempSrc:   Oral   SpO2: 96% 92%  96%  Weight: 100.1 kg     Height:        Intake/Output Summary (Last 24 hours) at 09/21/2019 0816 Last data filed at 09/21/2019 0200 Gross per 24 hour  Intake 358.51 ml  Output 1075 ml  Net -716.49 ml   Filed Weights   09/19/19 0500 09/20/19 0500 09/21/19 0500  Weight: 101 kg 98.9 kg 100.1 kg    Physical Exam    GEN- The patient is well appearing, alert and oriented x 3 today.    Head- normocephalic, atraumatic Eyes-  Sclera clear, conjunctiva pink Ears- hearing intact Oropharynx- clear Neck- supple Lungs- Clear to ausculation bilaterally, normal work of breathing Heart- Regular rate and rhythm, no murmurs, rubs or gallops GI- soft, NT, ND, + BS Extremities- no clubbing or cyanosis. No edema Skin- no rash or lesion Psych- euthymic mood, full affect Neuro- strength and sensation are intact  Labs    CBC Recent Labs    09/18/19 1247 09/18/19 1259 09/20/19 0954 09/21/19 0047  WBC 12.3*   < > 10.9* 8.9  NEUTROABS 9.3*  --   --   --   HGB 15.1   < > 13.6 13.0  HCT 48.6   < > 42.8 41.1  MCV 91.5   < > 89.7 90.9  PLT 238   < > 166 142*   < > = values in this interval not displayed.   Basic Metabolic Panel Recent Labs    09/20/19 0954 09/21/19 0047  NA 135 137  K 3.7 4.4  CL 96* 99  CO2 26 29  GLUCOSE 147* 115*  BUN 37* 29*  CREATININE 1.61* 1.40*  CALCIUM 8.8* 8.6*  MG 2.3 2.5*  PHOS 4.1 3.8   Liver Function Tests Recent Labs  09/19/19 1345  PROT 6.7   No results for input(s): LIPASE, AMYLASE in the last 72 hours. Cardiac Enzymes No results for input(s): CKTOTAL, CKMB, CKMBINDEX, TROPONINI in the last 72 hours.   Telemetry    NSR, no further VT (personally reviewed)  Radiology    DG Chest Port 1 View  Result Date: 09/21/2019 CLINICAL DATA:  Extubation.  Respiratory failure. EXAM: PORTABLE CHEST 1 VIEW COMPARISON:  09/20/2019.  05/25/2019. FINDINGS: Interim extubation and removal of NG tube. Cardiac pacer stable position. Prior CABG and left atrial appendage clip again noted. Stable cardiomegaly. Persistent bilateral interstitial prominence suggesting interstitial edema. Pneumonitis cannot be excluded. No interim change. These changes are most likely superimposed on chronic interstitial disease. Stable small bilateral pleural effusions and or scarring. Stable pleural calcification. No pneumothorax. IMPRESSION: 1.  Interim extubation  removal of NG tube. 2. Cardiac pacer stable position. Prior CABG and left atrial appendage clip again noted. Stable cardiomegaly. 3. Persistent bilateral interstitial prominence suggesting interstitial edema. Pneumonitis cannot be excluded. No interim change. These changes are most likely superimposed on chronic interstitial disease. Stable small bilateral pleural effusions and or scarring. Stable pleural calcification. Electronically Signed   By: Marcello Moores  Register   On: 09/21/2019 06:50   DG Chest Port 1 View  Result Date: 09/20/2019 CLINICAL DATA:  Acute respiratory failure.  Intubation EXAM: PORTABLE CHEST 1 VIEW COMPARISON:  Yesterday FINDINGS: Endotracheal tube with tip halfway between the clavicular heads and carina. The enteric tube reaches the stomach. ICD/pacer leads from the left in unremarkable position. CABG and left atrial clipping. Unchanged pulmonary opacity which could be mild edema but is also partially chronic. There is hyperinflation and pleural based scarring at both bases with pleural calcifications on the right. IMPRESSION: Stable hardware positioning and aeration. Electronically Signed   By: Monte Fantasia M.D.   On: 09/20/2019 07:30   DG Chest Port 1 View  Result Date: 09/19/2019 CLINICAL DATA:  Hypoxia.  Status post thoracentesis EXAM: PORTABLE CHEST 1 VIEW COMPARISON:  September 19, 2019 study obtained earlier in the day FINDINGS: Endotracheal tube tip is 4.2 cm above the carina. Nasogastric tube tip and side port below the diaphragm. Pacemaker lead positions unchanged. No appreciable pneumothorax. Small pleural effusions noted bilaterally. Patchy airspace opacity noted in each mid and lower lung region. Heart is mildly enlarged. Left atrial appendage clamp present. No evident adenopathy. Old healed fracture left clavicle again noted. IMPRESSION: Tube positions as described. No evident pneumothorax. Small pleural effusions with airspace opacity bilaterally. Suspect pulmonary edema,  although a degree of pneumonia superimposed cannot be excluded. Stable cardiac silhouette. Electronically Signed   By: Lowella Grip III M.D.   On: 09/19/2019 12:08   DG Abd Portable 1V  Result Date: 09/20/2019 CLINICAL DATA:  Vomiting EXAM: PORTABLE ABDOMEN - 1 VIEW COMPARISON:  None. FINDINGS: Nasogastric tube tip is in the proximal stomach with the side port at the gastroesophageal junction. There is moderate stool in the colon. There is no bowel dilatation or air-fluid level to suggest bowel obstruction. No free air. There is a small right pleural effusion with calcification along the right pleura. Pacemaker leads attached to right heart. IMPRESSION: Nasogastric tube tip in proximal stomach with side port at the gastroesophageal junction. It may be prudent to consider advancing nasogastric tube 4-5 cm. No bowel obstruction or free air. Small right pleural effusion noted. Electronically Signed   By: Lowella Grip III M.D.   On: 09/20/2019 09:20    Patient Profile  ELEAZAR KIMMEY is a 69 y.o. 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 History of PAF on eliquis with recent [facility] 8/31 for afib.  History of single lead AICD and recurrent VT RX with oral amiodarone and mexiletine who is being seen today for the evaluation of VT with ICD shock at the request of Dr. Tamala Julian.  Assessment & Plan    1. Monomorphic VT No further on IV amiodarone.  Continue mexitil Stop IV amiodarone after this bag. Start amiodarone 200 mg daily.  He is scheduled for VT ablation 09/26/2019. He has been asked to stop his amiodarone 5 days prior to this, but they are not aware of his recurrent VT. We have asked him to follow up via phone prior to stopping.  Would resume toprol as BP tolerates.  Electrolyte stable.  Goal K > 3.9 and Mg > 1.9   2. Acute on chronic systolic CHF Echo 0/51/1021 LVEF 30-35%, Grade 1 DD, Mod  RV dysfunction + effusions by CXR.  Volume status stable and extubated.  Resume home lasix regimen as tolerated as pressures come back up.    3. Pleural effusion s/p thoracentesis with 1400 cc of rust colored appearing fluid removed from left pleural space 9/7 Stable.    4. CAD s/p CABG 05/23/2019 Denies chest pain.  HS troponin negative.  No change.    5. Acute on chronic respiratory failure/COPD, on home 02 Diuresing.  WBC WNL CXR with bilateral effusions -> s/p thoracentesis as above. Extubated 09/20/2019.    6. PAF Resume Eliquis. Timing per pharmacy off of heparin.   Would observe on po meds overnight and plan for discharge tomorrow with close follow up with his VT ablation scheduled for next week.   For questions or updates, please contact [place] Please consult [email or web address] for contact info under Cardiology/STEMI.  Signed, Shirley Friar, PA-C  09/21/2019, 8:16 AM   EP Attending  Patient seen and examined. Agree with the findings as noted above. His VT is quiet. He will switch to oral amiodarone today and DC home tomorrow if his CHF is stable. I would not stop the amiodarone. His CHF is much improved.   Carleene Overlie Yaakov Saindon,MD

## 2019-09-22 LAB — CBC
HCT: 39.9 % (ref 39.0–52.0)
Hemoglobin: 12.3 g/dL — ABNORMAL LOW (ref 13.0–17.0)
MCH: 28.6 pg (ref 26.0–34.0)
MCHC: 30.8 g/dL (ref 30.0–36.0)
MCV: 92.8 fL (ref 80.0–100.0)
Platelets: 132 10*3/uL — ABNORMAL LOW (ref 150–400)
RBC: 4.3 MIL/uL (ref 4.22–5.81)
RDW: 16.7 % — ABNORMAL HIGH (ref 11.5–15.5)
WBC: 7.3 10*3/uL (ref 4.0–10.5)
nRBC: 0 % (ref 0.0–0.2)

## 2019-09-22 LAB — BASIC METABOLIC PANEL
Anion gap: 10 (ref 5–15)
BUN: 25 mg/dL — ABNORMAL HIGH (ref 8–23)
CO2: 31 mmol/L (ref 22–32)
Calcium: 8.7 mg/dL — ABNORMAL LOW (ref 8.9–10.3)
Chloride: 97 mmol/L — ABNORMAL LOW (ref 98–111)
Creatinine, Ser: 1.29 mg/dL — ABNORMAL HIGH (ref 0.61–1.24)
GFR calc Af Amer: >60 mL/min (ref >60)
GFR calc non Af Amer: 57 mL/min — ABNORMAL LOW (ref >60)
Glucose, Bld: 112 mg/dL — ABNORMAL HIGH (ref 70–99)
Potassium: 4.4 mmol/L (ref 3.5–5.1)
Sodium: 138 mmol/L (ref 135–145)

## 2019-09-22 LAB — BODY FLUID CULTURE: Culture: NO GROWTH

## 2019-09-22 LAB — GLUCOSE, CAPILLARY
Glucose-Capillary: 129 mg/dL — ABNORMAL HIGH (ref 70–99)
Glucose-Capillary: 127 mg/dL — ABNORMAL HIGH (ref 70–99)
Glucose-Capillary: 170 mg/dL — ABNORMAL HIGH (ref 70–99)
Glucose-Capillary: 134 mg/dL — ABNORMAL HIGH (ref 70–99)

## 2019-09-22 LAB — PHOSPHORUS: Phosphorus: 3.3 mg/dL (ref 2.5–4.6)

## 2019-09-22 LAB — MAGNESIUM: Magnesium: 2.3 mg/dL (ref 1.7–2.4)

## 2019-09-22 MED ORDER — ADULT MULTIVITAMIN W/MINERALS CH
1.0000 | ORAL_TABLET | Freq: Every day | ORAL | Status: DC
  Administered 2019-09-22 – 2019-09-23 (×2): 1 via ORAL
  Filled 2019-09-22 (×2): qty 1

## 2019-09-22 MED FILL — Rocuronium Bromide IV Soln 50 MG/5ML (10 MG/ML): INTRAVENOUS | Qty: 5 | Status: AC

## 2019-09-22 NOTE — Progress Notes (Signed)
Nutrition Follow-up  DOCUMENTATION CODES:   Not applicable  INTERVENTION:   -D/c Prosource TF -D/c Vital AF 1.2 -MVI with minerals daily -Double protein portions with meals  NUTRITION DIAGNOSIS:   Inadequate oral intake related to acute illness as evidenced by NPO status.  Progressing; advanced to heart healthy diet on 09/21/19  GOAL:   Patient will meet greater than or equal to 90% of their needs  Progressing   MONITOR:   PO intake, Supplement acceptance, Labs, Weight trends, Skin, I & O's  REASON FOR ASSESSMENT:   Consult, Ventilator Enteral/tube feeding initiation and management  ASSESSMENT:   69 yo male admitted with acute respiratory failure with bilateral pleural effusions, AECOPD; AKI on CKD. PMH includes CAD/CABG, COPD on home oxygen, former smoker, CHF, ischemic CM EF 40-45%  9/06 Admitted, intubated 9/07 TF initiated, s/p thoracentesis- 1.4 L reust colored fluid removed from pleural space 9/8 OGT connected to low intermittent suction secondary to vomiting, extubated, s/p BSE- advanced to clear liquids 9/9 s/p BSE- advanced to regular diet with thin liquids  Reviewed I/O's: -3.4 L x 24 hours and -4.8 L since admission  UOP: 4.3 L x 24 hours  Pt in with MD at time of visit. Pt will likely discharge home tomorrow.   Pt with no visible signs of fat and muscle depletion.  Pt tolerating meals well; noted meal completion 50-100%.   Medications reviewed and include miralax, prednisone, and senna.  Lab Results  Component Value Date   HGBA1C 6.2 (H) 09/10/2019   PTA DM medications are none.   Labs reviewed: CBGS: 129-166 (inpatient orders for glycemic control are none).   NUTRITION - FOCUSED PHYSICAL EXAM:    Most Recent Value  Orbital Region No depletion  Upper Arm Region No depletion  Thoracic and Lumbar Region No depletion  Buccal Region No depletion  Temple Region No depletion  Clavicle Bone Region No depletion  Clavicle and Acromion Bone  Region No depletion  Scapular Bone Region No depletion  Dorsal Hand No depletion  Patellar Region No depletion  Anterior Thigh Region No depletion  Posterior Calf Region No depletion  Edema (RD Assessment) Mild  Hair Reviewed  Eyes Reviewed  Mouth Reviewed  Skin Reviewed  Nails Reviewed       Diet Order:   Diet Order            Diet Heart Room service appropriate? Yes; Fluid consistency: Thin  Diet effective now                 EDUCATION NEEDS:   No education needs have been identified at this time  Skin:  Skin Assessment: Reviewed RN Assessment  Last BM:  09/22/19  Height:   Ht Readings from Last 1 Encounters:  09/21/19 5\' 11"  (1.803 m)    Weight:   Wt Readings from Last 1 Encounters:  09/22/19 98.4 kg    Ideal Body Weight:  78.2 kg  BMI:  Body mass index is 30.27 kg/m.  Estimated Nutritional Needs:   Kcal:  2150-2350  Protein:  120-135 grams  Fluid:  2 L    12-20-1989, RD, LDN, CDCES Registered Dietitian II Certified Diabetes Care and Education Specialist Please refer to [facility] for RD and/or RD on-call/weekend/after hours pager

## 2019-09-22 NOTE — Progress Notes (Addendum)
Electrophysiology Rounding Note  Patient Name: Max Rivas Date of Encounter: 09/22/2019  Primary Cardiologist: Sinclair Grooms, MD Electrophysiologist: Cristopher Peru, MD   Subjective   Feeling well this am. At this time, the patient denies chest pain, shortness of breath, or any new concerns.  Inpatient Medications    Scheduled Meds:  amiodarone  200 mg Oral Daily   apixaban  5 mg Oral BID   arformoterol  15 mcg Nebulization BID   aspirin  81 mg Oral Daily   atorvastatin  80 mg Oral Daily   budesonide (PULMICORT) nebulizer solution  0.5 mg Nebulization BID   chlorhexidine gluconate (MEDLINE KIT)  15 mL Mouth Rinse BID   Chlorhexidine Gluconate Cloth  6 each Topical Daily   feeding supplement (PROSource TF)  45 mL Per Tube Daily   furosemide  40 mg Intravenous BID   levothyroxine  75 mcg Oral QAC breakfast   mouth rinse  15 mL Mouth Rinse BID   mexiletine  150 mg Oral BID   montelukast  10 mg Oral Daily   polyethylene glycol  17 g Oral Daily   predniSONE  40 mg Oral Q breakfast   revefenacin  175 mcg Nebulization Daily   senna-docusate  1 tablet Oral BID   tamsulosin  0.4 mg Oral QPC supper   Continuous Infusions:  sodium chloride 250 mL (09/18/19 1533)   feeding supplement (VITAL AF 1.2 CAL) Stopped (09/19/19 1700)   lactated ringers     PRN Meds: acetaminophen, albuterol, docusate sodium, fentaNYL (SUBLIMAZE) injection, ondansetron (ZOFRAN) IV, polyethylene glycol   Vital Signs    Vitals:   09/21/19 2336 09/22/19 0541 09/22/19 0541 09/22/19 0732  BP: 109/68  121/64 123/63  Pulse: 68  72 72  Resp: _0 Temp: 97.7 F (36.5 C)  97.7 F (36.5 C) (!) 97.5 F (36.4 C)  TempSrc: Oral  Oral Oral  SpO2: 97%  94% 97%  Weight: 98.9 kg 98.4 kg    Height: _1  (1.803 m)       Intake/Output Summary (Last 24 hours) at 09/22/2019 0743 Last data filed at 09/22/2019 0600 Gross per 24 hour  Intake 925.25 ml  Output 4276 ml  Net -3350.75 ml   Filed  Weights   09/21/19 0500 09/21/19 2336 09/22/19 0541  Weight: 100.1 kg 98.9 kg 98.4 kg    Physical Exam    GEN- The patient is well appearing, alert and oriented x 3 today.   Head- normocephalic, atraumatic Eyes-  Sclera clear, conjunctiva pink Ears- hearing intact Oropharynx- clear Neck- supple Lungs- Mild wheeze throughout. normal work of breathing Heart- Regular rate and rhythm, no murmurs, rubs or gallops GI- soft, NT, ND, + BS Extremities- no clubbing or cyanosis. No edema Skin- no rash or lesion Psych- euthymic mood, full affect Neuro- strength and sensation are intact  Labs    CBC Recent Labs    09/21/19 0047 09/22/19 0442  WBC 8.9 7.3  HGB 13.0 12.3*  HCT 41.1 39.9  MCV 90.9 92.8  PLT 142* 703*   Basic Metabolic Panel Recent Labs    09/21/19 0047 09/22/19 0442  NA 137 138  K 4.4 4.4  CL 99 97*  CO2 29 31  GLUCOSE 115* 112*  BUN 29* 25*  CREATININE 1.40* 1.29*  CALCIUM 8.6* 8.7*  MG 2.5* 2.3  PHOS 3.8 3.3   Liver Function Tests Recent Labs    09/19/19 1345  PROT 6.7   No results  for input(s): LIPASE, AMYLASE in the last 72 hours. Cardiac Enzymes No results for input(s): CKTOTAL, CKMB, CKMBINDEX, TROPONINI in the last 72 hours.   Telemetry    NSR 60-70s (personally reviewed)  Radiology    DG Chest Port 1 View  Result Date: 09/21/2019 CLINICAL DATA:  Extubation.  Respiratory failure. EXAM: PORTABLE CHEST 1 VIEW COMPARISON:  09/20/2019.  05/25/2019. FINDINGS: Interim extubation and removal of NG tube. Cardiac pacer stable position. Prior CABG and left atrial appendage clip again noted. Stable cardiomegaly. Persistent bilateral interstitial prominence suggesting interstitial edema. Pneumonitis cannot be excluded. No interim change. These changes are most likely superimposed on chronic interstitial disease. Stable small bilateral pleural effusions and or scarring. Stable pleural calcification. No pneumothorax. IMPRESSION: 1.  Interim extubation  removal of NG tube. 2. Cardiac pacer stable position. Prior CABG and left atrial appendage clip again noted. Stable cardiomegaly. 3. Persistent bilateral interstitial prominence suggesting interstitial edema. Pneumonitis cannot be excluded. No interim change. These changes are most likely superimposed on chronic interstitial disease. Stable small bilateral pleural effusions and or scarring. Stable pleural calcification. Electronically Signed   By: Marcello Moores  Register   On: 09/21/2019 06:50   DG Abd Portable 1V  Result Date: 09/20/2019 CLINICAL DATA:  Vomiting EXAM: PORTABLE ABDOMEN - 1 VIEW COMPARISON:  None. FINDINGS: Nasogastric tube tip is in the proximal stomach with the side port at the gastroesophageal junction. There is moderate stool in the colon. There is no bowel dilatation or air-fluid level to suggest bowel obstruction. No free air. There is a small right pleural effusion with calcification along the right pleura. Pacemaker leads attached to right heart. IMPRESSION: Nasogastric tube tip in proximal stomach with side port at the gastroesophageal junction. It may be prudent to consider advancing nasogastric tube 4-5 cm. No bowel obstruction or free air. Small right pleural effusion noted. Electronically Signed   By: Lowella Grip III M.D.   On: 09/20/2019 09:20    Patient Profile     Max Rivas is a 69 y.o. 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 History of PAF on eliquis with recent [facility] 8/31 for afib.  History of single lead AICD and recurrent VT RX with oral amiodarone and mexiletine who is being seen today for the evaluation of VT with ICD shock at the request of Dr. Tamala Julian.  Assessment & Plan    1. Monomorphic VT Quiscent. Continue mexitil Continue amiodarone 200 mg daily. He will take a dose tomorrow and then hold as directed by [facility] for upcoming VT ablation 09/26/2019.  Would  toprol as BP tolerates.  Electrolyte stable. K 4.4 and Mg 2.3 Goal K > 3.9 and Mg > 1.9   2. Acute on chronic systolic CHF Echo 8/75/6433 LVEF 30-35%, Grade 1 DD, Mod RV dysfunction + effusions by CXR.  Volume status stable and extubated.  Resume home lasix regimen.    3. Pleural effusion s/p thoracentesis with 1400 cc of rust colored appearing fluid removed from left pleural space 9/7 Stable.    4. CAD s/p CABG 05/23/2019 Denies chest pain.  HS troponin negative.  No change.    5. Acute on chronic respiratory failure/COPD, on home 02 WBC WNL CXR with bilateral effusions -> s/p thoracentesis as above. Extubated 09/20/2019.  Mild wheeze on exam. Normal resp effort   6. PAF Back on Eliquis.  Remains in NSR.  Pt for close follow up and VT ablation next week at [facility].  EP will sign off. Please call back with any questions. Pt encouraged to rest over the weekend with very little exertion while holding amiodarone. Avoid salty foods and strenuous activity.   For questions or updates, please contact [place] Please consult [email or web address] for contact info under Cardiology/STEMI.  Signed, Shirley Friar, PA-C  09/22/2019, 7:43 AM   EP Attending  Patient seen and examined. Agree with the findings as noted above. The patient is doing well this morning. No VT. Dyspnea back to baseline. He has a soft expiratory wheeze but no increased work of breathing. Tele shows NSR. From EP perspective he can be discharged home, taking oral amio today and one dose tomorrow then stop the amio in anticipation for his VT ablation at [facility]. We will sign off.  Carleene Overlie Paxson Harrower,MD

## 2019-09-22 NOTE — ED Notes (Addendum)
Pt received 50 mg Rocuronium for intubation. Prior documented dose of 4,990 mg is incorrect. Unable to edit administration error. New administration charted of 50 mg. Documented administration of 4,990 mg can be disregarded.

## 2019-09-22 NOTE — Plan of Care (Signed)
  Problem: Activity: Goal: Risk for activity intolerance will decrease Outcome: Completed/Met   Problem: Nutrition: Goal: Adequate nutrition will be maintained Outcome: Completed/Met   Problem: Coping: Goal: Level of anxiety will decrease Outcome: Completed/Met   Problem: Elimination: Goal: Will not experience complications related to urinary retention Outcome: Completed/Met   Problem: Pain Managment: Goal: General experience of comfort will improve Outcome: Completed/Met   Problem: Safety: Goal: Ability to remain free from injury will improve Outcome: Completed/Met   Problem: Skin Integrity: Goal: Risk for impaired skin integrity will decrease Outcome: Completed/Met

## 2019-09-22 NOTE — Progress Notes (Addendum)
PROGRESS NOTE    Max Rivas  GNO:[id number] DOB: [birth year] DOA: 09/18/2019 PCP: [name], MD    Brief Narrative:  69 years old male with past medical history of CAD, CABG, COPD, former smoker, chronic systolic CHF, ischemic cardiomyopathy with EF of 40 to 45% presenting to the emergency department with acute shortness of breath.  He is found to have acute respiratory failure requiring BiPAP but patient declined to have the BiPAP due to claustrophobia.  He failed nonrebreather and required intubation.  He has developed ventricular tachycardia in ER requiring cardioversion  twice and was loaded with amiodarone.  Patient was admitted in the ICU,  successfully extubated.  chest x-ray showed bilateral small effusion, He  underwent thoracocentesis.1.4 L of transudative fluid removed.  Patient was followed by cardiology, He has paroxysmal A. Fib on heparin, transitioned to Eliquis.  PCCM pickup 910.  Patient is on 5 L supplemental oxygen saturating 94%.  Patient is cleared from electrophysiologist to be discharged. Plan: VT Ablation next week with [facility].   Assessment & Plan:   Active Problems:   Acute respiratory failure with hypoxia and hypercarbia (HCC)   V-tach (HCC)   Acute on chronic heart failure (HCC)   Acute respiratory failure with hypoxia (HCC)   Vomiting   Acute Hypoxemic & Hypercarbic Respiratory Failure: could be sec.to  COPD exacerbation, Bilateral Pleural Effusions: refused bipap during admission, failed NRB trial, elected for intubation due to claustrophobia.  Successfully extubated on 9/8, back to [place] at 5l  Baseline O2  at home 4L. -wean O2 for sats 88-95% -continue pulmicort + yupelri  -switched to po prednisone for 7 days -Underwent  Lt. thoracocentesis 1.4 L transudative fluid drained. -follow pleural culture, cytology negative.  Monomorphic VT :  He went into VT requiring cardioversion and amiodarone  -tele monitoring  -IV amiodarone transitioned to oral  amiodarone per Cardiology  -continue mexitil  -resume Toprol when BP improves. -pending ablation at [facility] 9/14. -monitor volume status closely, Resume lasix 40 mg BID and monitor renal function / BP closely. - can be discharged home form cardio perspective.  PAF on Xarelto  -transitioned to Eliquis -goal K >4, Mg > 2  CAD s/p CABG 05/2019, HLD -ASA, lipitor   AKI on CKD: -Trend BMP / urinary output -Replace electrolytes as indicated -Avoid nephrotoxic agents, ensure adequate renal perfusion  Hypothyroidism  -continue synthroid   At Risk Malnutrition  -advance diet as tolerated   Nausea / Vomiting  >> Improved. KUB negative for ileus, likely related to TF, constipation  -PRN antiemetics    DVT prophylaxis: Eliquis Code Status: Full Family Communication: discussed with patient Disposition Plan:  Status is: Inpatient  Remains inpatient appropriate because:Inpatient level of care appropriate due to severity of illness   Dispo: The patient is from: Home              Anticipated d/c is to: Home              Anticipated d/c date is: 1 day              Patient currently is not medically stable to d/c.  Consultants:  Cardiologist , PCCM.  Procedures: Intubation, extubation, cardioversion.   Antimicrobials:  Anti-infectives (From admission, onward)   None      Subjective: Patient was seen and examined at bedside.  Overnight events noted.  Patient reports feeling much better,  denies any chest pain and shortness of breath.  Patient is on 4 L supplemental oxygen,  saturating 94%.  Sitting comfortably on the bedside.  Objective: Vitals:   09/22/19 0541 09/22/19 0732 09/22/19 1142 09/22/19 1629  BP: 121/64 123/63 113/73 105/69  Pulse: 72 72 72 65  Resp: 17 17 18 16   Temp: 97.7 F (36.5 C) (!) 97.5 F (36.4 C) 98.3 F (36.8 C) 98.2 F (36.8 C)  TempSrc: Oral Oral    SpO2: 94% 97% 98% 95%  Weight:      Height:        Intake/Output Summary (Last 24  hours) at 09/22/2019 1636 Last data filed at 09/22/2019 1200 Gross per 24 hour  Intake 860 ml  Output 2526 ml  Net -1666 ml   Filed Weights   09/21/19 0500 09/21/19 2336 09/22/19 0541  Weight: 100.1 kg 98.9 kg 98.4 kg    Examination:  General exam: Appears calm and comfortable  Respiratory system: Clear to auscultation. Respiratory effort normal. Cardiovascular system: S1 & S2 heard, RRR. No JVD, murmurs, rubs, gallops or clicks. No pedal edema. Gastrointestinal system: Abdomen is nondistended, soft and nontender. No organomegaly or masses felt. Normal bowel sounds heard. Central nervous system: Alert and oriented. No focal neurological deficits. Extremities: No leg edema, no cyanosis, no clubbing. Skin: No rashes, lesions or ulcers Psychiatry: Judgement and insight appear normal. Mood & affect appropriate.     Data Reviewed: I have personally reviewed following labs and imaging studies  CBC: Recent Labs  Lab 09/18/19 1247 09/18/19 1259 09/18/19 1750 09/19/19 0223 09/20/19 0954 09/21/19 0047 09/22/19 0442  WBC 12.3*  --   --  7.8 10.9* 8.9 7.3  NEUTROABS 9.3*  --   --   --   --   --   --   HGB 15.1   < > 16.3 14.2 13.6 13.0 12.3*  HCT 48.6   < > 48.0 44.3 42.8 41.1 39.9  MCV 91.5  --   --  90.0 89.7 90.9 92.8  PLT 238  --   --  204 166 142* 132*   < > = values in this interval not displayed.   Basic Metabolic Panel: Recent Labs  Lab 09/18/19 1247 09/18/19 1247 09/18/19 1259 09/18/19 1259 09/18/19 1750 09/19/19 0223 09/19/19 0223 09/19/19 0912 09/19/19 1640 09/20/19 0954 09/21/19 0047 09/22/19 0442  NA 136   < > 136   < > 135 136  --   --   --  135 137 138  K 3.7   < > 3.7   < > 4.6 4.3  --   --   --  3.7 4.4 4.4  CL 96*   < > 97*  --   --  99  --   --   --  96* 99 97*  CO2 25  --   --   --   --  25  --   --   --  26 29 31   GLUCOSE 219*   < > 215*  --   --  159*  --   --   --  147* 115* 112*  BUN 15   < > 17  --   --  23  --   --   --  37* 29* 25*   CREATININE 1.57*   < > 1.40*  --   --  2.03*  --   --   --  1.61* 1.40* 1.29*  CALCIUM 8.8*  --   --   --   --  8.8*  --   --   --  8.8* 8.6* 8.7*  MG 2.0   < >  --   --   --  2.4   < > 2.3 2.4 2.3 2.5* 2.3  PHOS  --   --   --   --   --  4.4   < > 5.5* 4.6 4.1 3.8 3.3   < > = values in this interval not displayed.   GFR: Estimated Creatinine Clearance: 66.4 mL/min (A) (by C-G formula based on SCr of 1.29 mg/dL (H)). Liver Function Tests: Recent Labs  Lab 09/19/19 1345  PROT 6.7   No results for input(s): LIPASE, AMYLASE in the last 168 hours. No results for input(s): AMMONIA in the last 168 hours. Coagulation Profile: No results for input(s): INR, PROTIME in the last 168 hours. Cardiac Enzymes: No results for input(s): CKTOTAL, CKMB, CKMBINDEX, TROPONINI in the last 168 hours. BNP (last 3 results) No results for input(s): PROBNP in the last 8760 hours. HbA1C: No results for input(s): HGBA1C in the last 72 hours. CBG: Recent Labs  Lab 09/21/19 2133 09/21/19 2333 09/22/19 0539 09/22/19 1142 09/22/19 1630  GLUCAP 166* 130* 129* 127* 170*   Lipid Profile: No results for input(s): CHOL, HDL, LDLCALC, TRIG, CHOLHDL, LDLDIRECT in the last 72 hours. Thyroid Function Tests: No results for input(s): TSH, T4TOTAL, FREET4, T3FREE, THYROIDAB in the last 72 hours. Anemia Panel: No results for input(s): VITAMINB12, FOLATE, FERRITIN, TIBC, IRON, RETICCTPCT in the last 72 hours. Sepsis Labs: No results for input(s): PROCALCITON, LATICACIDVEN in the last 168 hours.  Recent Results (from the past 240 hour(s))  SARS Coronavirus 2 by RT PCR (hospital order, performed in [facility] hospital lab) Nasopharyngeal Nasopharyngeal Swab     Status: None   Collection Time: 09/18/19 12:50 PM   Specimen: Nasopharyngeal Swab  Result Value Ref Range Status   SARS Coronavirus 2 NEGATIVE NEGATIVE Final    Comment: (NOTE) SARS-CoV-2 target nucleic acids are NOT DETECTED.  The SARS-CoV-2 RNA is  generally detectable in upper and lower respiratory specimens during the acute phase of infection. The lowest concentration of SARS-CoV-2 viral copies this assay can detect is 250 copies / mL. A negative result does not preclude SARS-CoV-2 infection and should not be used as the sole basis for treatment or other patient management decisions.  A negative result may occur with improper specimen collection / handling, submission of specimen other than nasopharyngeal swab, presence of viral mutation(s) within the areas targeted by this assay, and inadequate number of viral copies (<250 copies / mL). A negative result must be combined with clinical observations, patient history, and epidemiological information.  Fact Sheet for Patients:   StrictlyIdeas.no  Fact Sheet for Healthcare Providers: BankingDealers.co.za  This test is not yet approved or  cleared by the [place] FDA and has been authorized for detection and/or diagnosis of SARS-CoV-2 by FDA under an Emergency Use Authorization (EUA).  This EUA will remain in effect (meaning this test can be used) for the duration of the COVID-19 declaration under Section 564(b)(1) of the Act, 21 U.S.C. section 360bbb-3(b)(1), unless the authorization is terminated or revoked sooner.  Performed at [facility], [place] [address], [place], [place] [address]   MRSA PCR Screening     Status: None   Collection Time: 09/18/19  4:35 PM   Specimen: Nasopharyngeal  Result Value Ref Range Status   MRSA by PCR NEGATIVE NEGATIVE Final    Comment:        The GeneXpert MRSA Assay (FDA approved  for NASAL specimens only), is one component of a comprehensive MRSA colonization surveillance program. It is not intended to diagnose MRSA infection nor to guide or monitor treatment for MRSA infections. Performed at [facility], [place] [address] [place], [place] [address]   Body fluid culture      Status: None   Collection Time: 09/19/19 11:53 AM   Specimen: Body Fluid  Result Value Ref Range Status   Specimen Description FLUID LEFT PLEURAL  Final   Special Requests NONE  Final   Gram Stain   Final    FEW WBC PRESENT, PREDOMINANTLY MONONUCLEAR NO ORGANISMS SEEN    Culture   Final    NO GROWTH 3 DAYS Performed at [facility], [address], [place],  [address]    Report Status 09/22/2019 FINAL  Final         Radiology Studies: DG Chest Port 1 View  Result Date: 09/21/2019 CLINICAL DATA:  Extubation.  Respiratory failure. EXAM: PORTABLE CHEST 1 VIEW COMPARISON:  09/20/2019.  05/25/2019. FINDINGS: Interim extubation and removal of NG tube. Cardiac pacer stable position. Prior CABG and left atrial appendage clip again noted. Stable cardiomegaly. Persistent bilateral interstitial prominence suggesting interstitial edema. Pneumonitis cannot be excluded. No interim change. These changes are most likely superimposed on chronic interstitial disease. Stable small bilateral pleural effusions and or scarring. Stable pleural calcification. No pneumothorax. IMPRESSION: 1.  Interim extubation removal of NG tube. 2. Cardiac pacer stable position. Prior CABG and left atrial appendage clip again noted. Stable cardiomegaly. 3. Persistent bilateral interstitial prominence suggesting interstitial edema. Pneumonitis cannot be excluded. No interim change. These changes are most likely superimposed on chronic interstitial disease. Stable small bilateral pleural effusions and or scarring. Stable pleural calcification. Electronically Signed   By: Marcello Moores  Register   On: 09/21/2019 06:50        Scheduled Meds: . amiodarone  200 mg Oral Daily  . apixaban  5 mg Oral BID  . arformoterol  15 mcg Nebulization BID  . aspirin  81 mg Oral Daily  . atorvastatin  80 mg Oral Daily  . budesonide (PULMICORT) nebulizer solution  0.5 mg Nebulization BID  . chlorhexidine gluconate (MEDLINE KIT)  15 mL  Mouth Rinse BID  . Chlorhexidine Gluconate Cloth  6 each Topical Daily  . furosemide  40 mg Intravenous BID  . levothyroxine  75 mcg Oral QAC breakfast  . mouth rinse  15 mL Mouth Rinse BID  . mexiletine  150 mg Oral BID  . montelukast  10 mg Oral Daily  . multivitamin with minerals  1 tablet Oral Daily  . polyethylene glycol  17 g Oral Daily  . predniSONE  40 mg Oral Q breakfast  . revefenacin  175 mcg Nebulization Daily  . senna-docusate  1 tablet Oral BID  . tamsulosin  0.4 mg Oral QPC supper   Continuous Infusions: . sodium chloride 250 mL (09/18/19 1533)  . lactated ringers       LOS: 4 days    Time spent: 35 mins.    Shawna Clamp, MD [facility]   If 7PM-7AM, please contact night-coverage

## 2019-09-22 NOTE — Progress Notes (Signed)
Pharmacist Heart Failure Core Measure Documentation  Assessment: Max Rivas has an EF documented as 30- 35% on 8/28 by TTE.  Rationale: Heart failure patients with left ventricular systolic dysfunction (LVSD) and an EF < 40% should be prescribed an angiotensin converting enzyme inhibitor (ACEI) or angiotensin receptor blocker (ARB) at discharge unless a contraindication is documented in the medical record.  This patient is not currently on an ACEI or ARB for HF.  This note is being placed in the record in order to provide documentation that a contraindication to the use of these agents is present for this encounter.  ACE Inhibitor or Angiotensin Receptor Blocker is contraindicated (specify all that apply)  []  ACEI allergy AND ARB allergy []  Angioedema []  Moderate or severe aortic stenosis []  Hyperkalemia [x]  Hypotension []  Renal artery stenosis [x]  Worsening renal function, preexisting renal disease or dysfunction  Sedrick Tober, PharmD, BCPS, BCCP Clinical Pharmacist  Please check AMION for all [facility] phone numbers After 10:00 PM, call Main Pharmacy [phone]   

## 2019-09-23 LAB — GLUCOSE, CAPILLARY
Glucose-Capillary: 132 mg/dL — ABNORMAL HIGH (ref 70–99)
Glucose-Capillary: 136 mg/dL — ABNORMAL HIGH (ref 70–99)
Glucose-Capillary: 164 mg/dL — ABNORMAL HIGH (ref 70–99)
Glucose-Capillary: 95 mg/dL (ref 70–99)

## 2019-09-23 LAB — BASIC METABOLIC PANEL
Anion gap: 9 (ref 5–15)
BUN: 22 mg/dL (ref 8–23)
CO2: 34 mmol/L — ABNORMAL HIGH (ref 22–32)
Calcium: 9 mg/dL (ref 8.9–10.3)
Chloride: 94 mmol/L — ABNORMAL LOW (ref 98–111)
Creatinine, Ser: 1.27 mg/dL — ABNORMAL HIGH (ref 0.61–1.24)
GFR calc Af Amer: >60 mL/min (ref >60)
GFR calc non Af Amer: 58 mL/min — ABNORMAL LOW (ref >60)
Glucose, Bld: 117 mg/dL — ABNORMAL HIGH (ref 70–99)
Potassium: 4.2 mmol/L (ref 3.5–5.1)
Sodium: 137 mmol/L (ref 135–145)

## 2019-09-23 LAB — CBC
HCT: 42.2 % (ref 39.0–52.0)
Hemoglobin: 13 g/dL (ref 13.0–17.0)
MCH: 28.4 pg (ref 26.0–34.0)
MCHC: 30.8 g/dL (ref 30.0–36.0)
MCV: 92.1 fL (ref 80.0–100.0)
Platelets: 136 10*3/uL — ABNORMAL LOW (ref 150–400)
RBC: 4.58 MIL/uL (ref 4.22–5.81)
RDW: 16.6 % — ABNORMAL HIGH (ref 11.5–15.5)
WBC: 7.8 10*3/uL (ref 4.0–10.5)
nRBC: 0 % (ref 0.0–0.2)

## 2019-09-23 LAB — MAGNESIUM: Magnesium: 2.2 mg/dL (ref 1.7–2.4)

## 2019-09-23 LAB — PHOSPHORUS: Phosphorus: 3.8 mg/dL (ref 2.5–4.6)

## 2019-09-23 MED ORDER — PREDNISONE 20 MG PO TABS: 40.0000 mg | ORAL_TABLET | Freq: Every day | ORAL | 0 refills | Status: DC

## 2019-09-23 NOTE — Progress Notes (Signed)
Pt was able to ambulate from room 30 to nurses station on home O2 of 5L via [place], while maintaining sats above 95%. No SOB, CP, or palpitations noted before, during or after ambulation. Pt is stable, sitting on edge of bed. No signs of distress noted. NO needs or concerns at this time. Will continue to monitor.

## 2019-09-23 NOTE — Discharge Summary (Signed)
Physician Discharge Summary  Max Rivas GUR:[id number] DOB: 1950-09-20 DOA: 09/18/2019  PCP: Anson Fret, MD  Admit date: 09/18/2019   Discharge date: 09/23/2019  Admitted From:  Home. Disposition:  Home  Recommendations for Outpatient Follow-up:  1. Follow up with PCP in 1-2 weeks. 2. Please obtain BMP/CBC in one week. 3. Patient has ongoing appointment on 9/14 for VT ablation at [facility]. 4. Patient medication has been adjusted, advised to continue as prescribed. 5. We will request PCP/cardiologist to resume ACE inhibitor once renal functions improved. 6. Advised to continue prednisone for 6 more days for bronchitis.  Home Health: None.  Equipment/Devices: Oxygen @ 5L/min  Discharge Condition: Stable CODE STATUS:Full code Diet recommendation: Heart Healthy   Brief [facility] course : 69 years old male with past medical history significant of CAD, CABG, COPD on 5l/m O2 at baseline , former smoker, chronic systolic CHF, ischemic cardiomyopathy with EF of 40 to 45% presented to the emergency department with acute shortness of breath.  He is found to have acute respiratory failure requiring BiPAP but patient declined to have the BiPAP due to claustrophobia.  He failed nonrebreather trial and required intubation.  He has developed ventricular tachycardia in ER requiring cardioversion twice and was loaded with amiodarone.  Patient was admitted in the ICU,  He remained in ICU for few days, He was successfully extubated.  chest x-ray showed bilateral small effusion, He underwent  Lt .thoracocentesis.1.4 L of transudative fluid removed. Patient was followed by cardiology, He has hx. of paroxysmal A. Fib, was on Eliquis PTA, started on heparin, then transitioned to Eliquis.  PCCM pickup 910.  Patient is on 5 L supplemental oxygen saturating 94%. Patient is doing much better, cleared from electrophysiologist to be discharged.  Patient ambulated well at his baseline oxygen requirement,  maintaining O2 saturation above 95%.  Patient denies any chest pain shortness of breath and dizziness at discharge.  Patient completed amiodarone recommendation by cardiology.  Plan: VT Ablation scheduled next week with [facility] on 9/14.  Patient is being discharged home on home oxygen. He was managed for below problems.  Discharge Diagnoses:  Active Problems:   Acute respiratory failure with hypoxia and hypercarbia (HCC)   V-tach (HCC)   Acute on chronic heart failure (HCC)   Acute respiratory failure with hypoxia (HCC)   Vomiting  Acute Hypoxemic &Hypercarbic Respiratory Failure: could be sec.to  COPD exacerbation, Bilateral Pleural Effusions: refused bipap during admission, failed NRB trial, elected for intubation due to claustrophobia.  Successfully extubated on 9/8, back to [place] at 5l  Baseline O2  at home 4L. -wean O2 for sats 88-95% -continue pulmicort + yupelri  -switched to po prednisone for 7 days. -Underwent  Lt. thoracocentesis 1.4 L transudative fluid drained. -follow pleural culture, cytology negative.  Monomorphic VT :  He went into VT requiring cardioversion and amiodarone  -tele monitoring  -IV amiodarone transitioned to oral amiodarone per Cardiology  -continue mexitil  -resume Toprol when BP improves. -pending ablation at [facility] 9/14. -monitor volume status closely,Resume lasix 40 mg BID and monitor renal function / BP closely. - can be discharged home from cardio perspective.  PAF on Xarelto -transitioned to Eliquis -goal K >4, Mg > 2  CADs/p CABG 05/2019, HLD -ASA, lipitor  AKI on CKD: -Trend BMP / urinary output -Replace electrolytes as indicated -Avoid nephrotoxic agents, ensure adequate renal perfusion  Hypothyroidism -continue synthroid  At Risk Malnutrition -advance diet as tolerated  Nausea / Vomiting >> Improved. KUB negative for ileus,  likely related to TF, constipation  -PRN antiemetics  Discharge Instructions  Discharge  Instructions    Call MD for:  difficulty breathing, headache or visual disturbances   Complete by: As directed    Call MD for:  persistant dizziness or light-headedness   Complete by: As directed    Call MD for:  persistant nausea and vomiting   Complete by: As directed    Call MD for:  temperature >100.4   Complete by: As directed    Diet - low sodium heart healthy   Complete by: As directed    Diet Carb Modified   Complete by: As directed    Discharge instructions   Complete by: As directed    Advised to follow-up with primary care physician in 1 week. Patient has ongoing appointment on Monday, September 14 and [facility] for cardioversion. Patient medication has been adjusted advised to continue as prescribed.   Increase activity slowly   Complete by: As directed      Allergies as of 09/23/2019      Reactions   Crestor [rosuvastatin Calcium] Other (See Comments)   Back spasms, but can tolerate it at a low dose now      Medication List    STOP taking these medications   amiodarone 200 MG tablet Commonly known as: PACERONE     TAKE these medications   acetaminophen 325 MG tablet Commonly known as: TYLENOL Take 2 tablets (650 mg total) by mouth every 6 (six) hours as needed for mild pain (or Fever >/= 101).   aspirin 81 MG chewable tablet Chew 1 tablet (81 mg total) by mouth daily.   atorvastatin 80 MG tablet Commonly known as: LIPITOR Take 1 tablet (80 mg total) by mouth daily.   carboxymethylcellulose 0.5 % Soln Commonly known as: REFRESH PLUS Place 1 drop into both eyes at bedtime.   Eliquis 5 MG Tabs tablet Generic drug: apixaban Take 5 mg by mouth 2 (two) times daily.   furosemide 40 MG tablet Commonly known as: LASIX Take 40-80 mg by mouth 2 (two) times daily. Take 2 tablets (80 mg) in the morning and Take 1 tablet (40 mg) at bedtime   gabapentin 100 MG capsule Commonly known as: NEURONTIN Take 2 capsules (200 mg total) by mouth 2 (two) times  daily.   levothyroxine 75 MCG tablet Commonly known as: SYNTHROID Take 75 mcg by mouth daily before breakfast.   metoprolol succinate 25 MG 24 hr tablet Commonly known as: TOPROL-XL Take 12.5 mg by mouth daily.   mexiletine 150 MG capsule Commonly known as: MEXITIL Take 150 mg by mouth 2 (two) times daily.   mometasone 220 MCG/INH inhaler Commonly known as: ASMANEX Inhale 2 puffs into the lungs 2 (two) times daily.   montelukast 10 MG tablet Commonly known as: SINGULAIR Take 10 mg by mouth daily.   multivitamin with minerals Tabs tablet Take 1 tablet by mouth daily.   nitroGLYCERIN 0.4 MG SL tablet Commonly known as: NITROSTAT Place 0.4 mg under the tongue every 5 (five) minutes as needed for chest pain.   omega-3 acid ethyl esters 1 g capsule Commonly known as: LOVAZA Take 1 capsule (1 g total) by mouth 2 (two) times daily.   omeprazole 20 MG capsule Commonly known as: PRILOSEC Take 20 mg by mouth 2 (two) times daily before a meal.   predniSONE 20 MG tablet Commonly known as: DELTASONE Take 2 tablets (40 mg total) by mouth daily with breakfast. Start taking on:  September 24, 2019   Stiolto Respimat 2.5-2.5 MCG/ACT Aers Generic drug: Tiotropium Bromide-Olodaterol Inhale 2 puffs into the lungs daily.   Systane Balance 0.6 % Soln Generic drug: Propylene Glycol Place 1 drop into both eyes See admin instructions. Place 1 drop into each eye four times a day and apply a warm compress for 10 minutes afterwards   Vitamin D3 50 MCG (2000 UT) Tabs Take 2,000 Units by mouth daily.   Xopenex HFA 45 MCG/ACT inhaler Generic drug: levalbuterol Inhale 2 puffs into the lungs every 6 (six) hours as needed for wheezing or shortness of breath.            Durable Medical Equipment  (From admission, onward)         Start     Ordered   09/23/19 1142  DME Oxygen  Once       Question Answer Comment  Length of Need Lifetime   Mode or (Route) Nasal cannula   Liters per  Minute 5   Frequency Continuous (stationary and portable oxygen unit needed)   Oxygen conserving device Yes   Oxygen delivery system Gas      09/23/19 1143          Follow-up Information    Anson Fret, MD Follow up in 1 week(s).   Specialty: Family Medicine Contact information: [address] [place] [address] [phone]        Marinus Maw, MD .   Specialty: Cardiology Contact information: [phone] [address]. [address] [place] [address] [phone]        Lyn Records, MD .   Specialty: Cardiology Contact information: [phone] [address]. [address] [place] [address] [phone]              Allergies  Allergen Reactions  . Crestor [Rosuvastatin Calcium] Other (See Comments)    Back spasms, but can tolerate it at a low dose now     Consultations:  PCCM, cardiologist EP.   Procedures/Studies: DG Chest 2 View  Result Date: 08/28/2019 CLINICAL DATA:  Chest pain. EXAM: CHEST - 2 VIEW COMPARISON:  Most recent radiograph 06/28/2019.  Chest CT 07/25/2018 FINDINGS: Median sternotomy. Clipping of left atrial appendage. Left-sided pacemaker in place with unchanged leads. Stable cardiomediastinal contours with upper normal heart size. Chronic right pleural thickening and pleural calcifications. Scarring at the right lung base. Small left pleural effusion has improved from prior exam. No pulmonary edema or acute airspace disease. No pneumothorax. No acute osseous abnormalities are seen. IMPRESSION: 1. No acute findings. 2. Small left pleural effusion has improved from prior exam. 3. Chronic right pleural thickening, pleural calcifications, and basilar scarring. Electronically Signed   By: Narda Rutherford M.D.   On: 08/28/2019 01:33   DG Chest Port 1 View  Result Date: 09/21/2019 CLINICAL DATA:  Extubation.  Respiratory failure. EXAM: PORTABLE CHEST 1 VIEW COMPARISON:  09/20/2019.  05/25/2019. FINDINGS: Interim  extubation and removal of NG tube. Cardiac pacer stable position. Prior CABG and left atrial appendage clip again noted. Stable cardiomegaly. Persistent bilateral interstitial prominence suggesting interstitial edema. Pneumonitis cannot be excluded. No interim change. These changes are most likely superimposed on chronic interstitial disease. Stable small bilateral pleural effusions and or scarring. Stable pleural calcification. No pneumothorax. IMPRESSION: 1.  Interim extubation removal of NG tube. 2. Cardiac pacer stable position. Prior CABG and left atrial appendage clip again noted. Stable cardiomegaly. 3. Persistent bilateral interstitial prominence suggesting interstitial edema. Pneumonitis cannot be excluded. No interim  change. These changes are most likely superimposed on chronic interstitial disease. Stable small bilateral pleural effusions and or scarring. Stable pleural calcification. Electronically Signed   By: Maisie Fus  Register   On: 09/21/2019 06:50   DG Chest Port 1 View  Result Date: 09/20/2019 CLINICAL DATA:  Acute respiratory failure.  Intubation EXAM: PORTABLE CHEST 1 VIEW COMPARISON:  Yesterday FINDINGS: Endotracheal tube with tip halfway between the clavicular heads and carina. The enteric tube reaches the stomach. ICD/pacer leads from the left in unremarkable position. CABG and left atrial clipping. Unchanged pulmonary opacity which could be mild edema but is also partially chronic. There is hyperinflation and pleural based scarring at both bases with pleural calcifications on the right. IMPRESSION: Stable hardware positioning and aeration. Electronically Signed   By: Marnee Spring M.D.   On: 09/20/2019 07:30   DG Chest Port 1 View  Result Date: 09/19/2019 CLINICAL DATA:  Hypoxia.  Status post thoracentesis EXAM: PORTABLE CHEST 1 VIEW COMPARISON:  September 19, 2019 study obtained earlier in the day FINDINGS: Endotracheal tube tip is 4.2 cm above the carina. Nasogastric tube tip and side  port below the diaphragm. Pacemaker lead positions unchanged. No appreciable pneumothorax. Small pleural effusions noted bilaterally. Patchy airspace opacity noted in each mid and lower lung region. Heart is mildly enlarged. Left atrial appendage clamp present. No evident adenopathy. Old healed fracture left clavicle again noted. IMPRESSION: Tube positions as described. No evident pneumothorax. Small pleural effusions with airspace opacity bilaterally. Suspect pulmonary edema, although a degree of pneumonia superimposed cannot be excluded. Stable cardiac silhouette. Electronically Signed   By: Bretta Bang III M.D.   On: 09/19/2019 12:08   DG Chest Port 1 View  Result Date: 09/19/2019 CLINICAL DATA:  Intubation. EXAM: PORTABLE CHEST 1 VIEW COMPARISON:  09/18/2019.  CT 07/25/2018. FINDINGS: Endotracheal tube and NG tube in stable position. Cardiac pacer stable position. Prior median sternotomy. Left atrial appendage clip in stable position. Cardiomegaly again noted. No pulmonary venous congestion. Stable prominent central pulmonary arteries. Left lower lobe atelectasis again noted. Diffuse bilateral pulmonary infiltrates/edema again noted. Bilateral pleural thickening/effusions again noted. Calcified pleural plaques again noted. IMPRESSION: 1.  Endotracheal tube and NG tube in stable position. 2. Cardiac pacer in stable position. Prior median sternotomy. Left atrial appendage clip in stable position. Cardiomegaly again noted. No pulmonary venous congestion. Prominent central pulmonary arteries again noted suggesting pulmonary hypertension. 3. Left lower lobe atelectasis again noted. Diffuse bilateral pulmonary infiltrates/edema again noted. Similar findings noted on prior exam. Bilateral pleural thickening/effusions again noted. Calcified pleural plaques, possibly from prior asbestos exposure, again noted Electronically Signed   By: Maisie Fus  Register   On: 09/19/2019 06:26   DG Chest Portable 1  View  Result Date: 09/18/2019 CLINICAL DATA:  Intubation and orogastric tube placement. EXAM: PORTABLE CHEST 1 VIEW COMPARISON:  09/18/2019 at 12:58 p.m. FINDINGS: New endotracheal tube has its tip projecting 4 cm above the carina. Orogastric tube passes below the diaphragm into the proximal stomach. Bilateral interstitial and hazy airspace lung opacities are unchanged from the earlier exam. Additional opacity at the lung bases is likely due to small effusions with atelectasis, greater on the left, also unchanged. IMPRESSION: 1. Well-positioned endotracheal tube and nasal/orogastric tube as detailed. No other change from the earlier study. Electronically Signed   By: Amie Portland M.D.   On: 09/18/2019 16:05   DG Chest Portable 1 View  Result Date: 09/18/2019 CLINICAL DATA:  Ventricular tachycardia. EXAM: PORTABLE CHEST 1 VIEW COMPARISON:  September 08, 2019 FINDINGS: Cardiac pacemaker in stable position. Cardiomediastinal silhouette is stable. Bilateral pleural effusions. Increased interstitial markings superimposed on pre-existing pleural subpleural scarring. Osseous structures are without acute abnormality. Soft tissues are grossly normal. IMPRESSION: 1. Bilateral pleural effusions. 2. Increased interstitial markings superimposed on pre-existing pleural subpleural scarring, likely represent interstitial pulmonary edema. Electronically Signed   By: Ted Mcalpine M.D.   On: 09/18/2019 13:22   DG Chest Port 1 View  Result Date: 09/08/2019 CLINICAL DATA:  Shortness of breath EXAM: PORTABLE CHEST 1 VIEW COMPARISON:  08/28/2019 FINDINGS: Left AICD remains in place, unchanged. Cardiomegaly, vascular congestion. There is hyperinflation of the lungs compatible with COPD. Probable underlying fibrosis within the lungs. Small right effusion and moderate left effusion. Mid and lower lung increased interstitial markings and airspace disease likely reflect superimposed edema. IMPRESSION: Cardiomegaly. COPD/chronic  lung disease. Bilateral pleural effusions, enlarged since prior study, moderate on the left. Probable mild superimposed pulmonary edema. Electronically Signed   By: Charlett Nose M.D.   On: 09/08/2019 18:05   DG Abd Portable 1V  Result Date: 09/20/2019 CLINICAL DATA:  Vomiting EXAM: PORTABLE ABDOMEN - 1 VIEW COMPARISON:  None. FINDINGS: Nasogastric tube tip is in the proximal stomach with the side port at the gastroesophageal junction. There is moderate stool in the colon. There is no bowel dilatation or air-fluid level to suggest bowel obstruction. No free air. There is a small right pleural effusion with calcification along the right pleura. Pacemaker leads attached to right heart. IMPRESSION: Nasogastric tube tip in proximal stomach with side port at the gastroesophageal junction. It may be prudent to consider advancing nasogastric tube 4-5 cm. No bowel obstruction or free air. Small right pleural effusion noted. Electronically Signed   By: Bretta Bang III M.D.   On: 09/20/2019 09:20   ECHOCARDIOGRAM COMPLETE  Result Date: 09/09/2019    ECHOCARDIOGRAM REPORT   Patient Name:   SABAN HEINLEN Date of Exam: 09/09/2019 Medical Rec #:  [id number]      Height:       71.0 in Accession #:    [id number]     Weight:       221.0 lb Date of Birth:  1950-07-25      BSA:          2.200 m Patient Age:    69 years       BP:           107/74 mmHg Patient Gender: M              HR:           84 bpm. Exam Location:  Inpatient Procedure: 2D Echo, Color Doppler, Cardiac Doppler and Intracardiac            Opacification Agent Indications:    I48.91* Unspecified atrial fibrillation  History:        Patient has prior history of Echocardiogram examinations, most                 recent 05/23/2019. Pacemaker and Defibrillator, COPD,                 Arrythmias:Atrial Fibrillation; Risk Factors:Dyslipidemia and                 Sleep Apnea.  Sonographer:    Irving Burton Senior RDCS Referring Phys: [phone] [facility]  Sonographer  Comments: Technically difficult, patient scanned sitting up due to difficulty breathing. IMPRESSIONS  1. Left ventricular ejection fraction, by estimation, is 30 to  35%. The left ventricle has moderately decreased function. The left ventricle demonstrates global hypokinesis. The left ventricular internal cavity size was moderately dilated. Left ventricular diastolic parameters are consistent with Grade I diastolic dysfunction (impaired relaxation).  2. Right ventricular systolic function is moderately reduced. The right ventricular size is mildly enlarged. There is moderately elevated pulmonary artery systolic pressure.  3. Left atrial size was severely dilated.  4. Right atrial size was mildly dilated.  5. The mitral valve is normal in structure. Mild mitral valve regurgitation. No evidence of mitral stenosis.  6. The aortic valve is normal in structure. Aortic valve regurgitation is not visualized. No aortic stenosis is present.  7. The inferior vena cava is dilated in size with <50% respiratory variability, suggesting right atrial pressure of 15 mmHg. FINDINGS  Left Ventricle: Left ventricular ejection fraction, by estimation, is 30 to 35%. The left ventricle has moderately decreased function. The left ventricle demonstrates global hypokinesis. Definity contrast agent was given IV to delineate the left ventricular endocardial borders. The left ventricular internal cavity size was moderately dilated. There is no left ventricular hypertrophy. Left ventricular diastolic parameters are consistent with Grade I diastolic dysfunction (impaired relaxation).  LV Wall Scoring: The basal inferolateral segment is akinetic. Right Ventricle: The right ventricular size is mildly enlarged. No increase in right ventricular wall thickness. Right ventricular systolic function is moderately reduced. There is moderately elevated pulmonary artery systolic pressure. The tricuspid regurgitant velocity is 2.88 m/s, and with an assumed  right atrial pressure of 15 mmHg, the estimated right ventricular systolic pressure is 48.2 mmHg. Left Atrium: Left atrial size was severely dilated. Right Atrium: Right atrial size was mildly dilated. Pericardium: There is no evidence of pericardial effusion. Mitral Valve: The mitral valve is normal in structure. Normal mobility of the mitral valve leaflets. Mild mitral valve regurgitation. No evidence of mitral valve stenosis. Tricuspid Valve: The tricuspid valve is normal in structure. Tricuspid valve regurgitation is mild . No evidence of tricuspid stenosis. Aortic Valve: The aortic valve is normal in structure. Aortic valve regurgitation is not visualized. No aortic stenosis is present. Pulmonic Valve: The pulmonic valve was normal in structure. Pulmonic valve regurgitation is not visualized. No evidence of pulmonic stenosis. Aorta: The aortic root is normal in size and structure. Venous: The inferior vena cava is dilated in size with less than 50% respiratory variability, suggesting right atrial pressure of 15 mmHg. IAS/Shunts: No atrial level shunt detected by color flow Doppler. Additional Comments: A pacer wire is visualized.  LEFT VENTRICLE PLAX 2D LVIDd:         6.40 cm LVIDs:         6.30 cm LV PW:         0.60 cm LV IVS:        0.90 cm LVOT diam:     2.00 cm LV SV:         33 LV SV Index:   15 LVOT Area:     3.14 cm  RIGHT VENTRICLE RV S prime:     5.44 cm/s TAPSE (M-mode): 1.4 cm LEFT ATRIUM              Index       RIGHT ATRIUM           Index LA diam:        5.90 cm  2.68 cm/m  RA Area:     19.40 cm LA Vol (A2C):   112.0 ml 50.91 ml/m RA Volume:  53.50 ml  24.32 ml/m LA Vol (A4C):   129.0 ml 58.64 ml/m LA Biplane Vol: 121.0 ml 55.00 ml/m  AORTIC VALVE LVOT Vmax:   66.58 cm/s LVOT Vmean:  46.125 cm/s LVOT VTI:    0.104 m  AORTA Ao Root diam: 3.00 cm TRICUSPID VALVE TR Peak grad:   33.2 mmHg TR Vmax:        288.00 cm/s  SHUNTS Systemic VTI:  0.10 m Systemic Diam: 2.00 cm Donato Schultz MD  Electronically signed by Donato Schultz MD Signature Date/Time: 09/09/2019/1:11:22 PM    Final     (Echo, Carotid, EGD, Colonoscopy, ERCP)    Subjective: Patient was seen and examined at bedside.  No overnight events.  Patient reports feeling much better.  Patient is at baseline 5 L supplemental oxygen saturating 94%.  He has ambulated without any difficulties.  Patient wants to be discharged.  He has ongoing appointment on 9/14 for VT ablation with [facility].  Discharge Exam: Vitals:   09/23/19 0344 09/23/19 0808  BP: (!) 100/51 116/64  Pulse: 69 75  Resp: 16 20  Temp: 98.7 F (37.1 C) 97.6 F (36.4 C)  SpO2: 94% 100%   Vitals:   09/22/19 2037 09/23/19 0221 09/23/19 0344 09/23/19 0808  BP:  118/64 (!) 100/51 116/64  Pulse:  68 69 75  Resp:  15 16 20   Temp:  97.7 F (36.5 C) 98.7 F (37.1 C) 97.6 F (36.4 C)  TempSrc:  Oral Oral Oral  SpO2: 97% 94% 94% 100%  Weight:   96.9 kg   Height:        General: Pt is alert, awake, not in acute distress Cardiovascular: RRR, S1/S2 +, no rubs, no gallops Respiratory: CTA bilaterally, no wheezing, no rhonchi Abdominal: Soft, NT, ND, bowel sounds + Extremities: no edema, no cyanosis    The results of significant diagnostics from this hospitalization (including imaging, microbiology, ancillary and laboratory) are listed below for reference.     Microbiology: Recent Results (from the past 240 hour(s))  SARS Coronavirus 2 by RT PCR (hospital order, performed in [facility] hospital lab) Nasopharyngeal Nasopharyngeal Swab     Status: None   Collection Time: 09/18/19 12:50 PM   Specimen: Nasopharyngeal Swab  Result Value Ref Range Status   SARS Coronavirus 2 NEGATIVE NEGATIVE Final    Comment: (NOTE) SARS-CoV-2 target nucleic acids are NOT DETECTED.  The SARS-CoV-2 RNA is generally detectable in upper and lower respiratory specimens during the acute phase of infection. The lowest concentration of SARS-CoV-2 viral copies  this assay can detect is 250 copies / mL. A negative result does not preclude SARS-CoV-2 infection and should not be used as the sole basis for treatment or other patient management decisions.  A negative result may occur with improper specimen collection / handling, submission of specimen other than nasopharyngeal swab, presence of viral mutation(s) within the areas targeted by this assay, and inadequate number of viral copies (<250 copies / mL). A negative result must be combined with clinical observations, patient history, and epidemiological information.  Fact Sheet for Patients:   BoilerBrush.com.cy  Fact Sheet for Healthcare Providers: https://pope.com/  This test is not yet approved or  cleared by the [place] FDA and has been authorized for detection and/or diagnosis of SARS-CoV-2 by FDA under an Emergency Use Authorization (EUA).  This EUA will remain in effect (meaning this test can be used) for the duration of the COVID-19 declaration under Section 564(b)(1) of the Act, 21 U.S.C.  section 360bbb-3(b)(1), unless the authorization is terminated or revoked sooner.  Performed at [facility], [address], [place], [place] [address]   MRSA PCR Screening     Status: None   Collection Time: 09/18/19  4:35 PM   Specimen: Nasopharyngeal  Result Value Ref Range Status   MRSA by PCR NEGATIVE NEGATIVE Final    Comment:        The GeneXpert MRSA Assay (FDA approved for NASAL specimens only), is one component of a comprehensive MRSA colonization surveillance program. It is not intended to diagnose MRSA infection nor to guide or monitor treatment for MRSA infections. Performed at [facility], [address] [place], [place] [address]   Body fluid culture     Status: None   Collection Time: 09/19/19 11:53 AM   Specimen: Body Fluid  Result Value Ref Range Status   Specimen Description FLUID LEFT PLEURAL   Final   Special Requests NONE  Final   Gram Stain   Final    FEW WBC PRESENT, PREDOMINANTLY MONONUCLEAR NO ORGANISMS SEEN    Culture   Final    NO GROWTH 3 DAYS Performed at [facility], [address] [place], [place] [address]    Report Status 09/22/2019 FINAL  Final     Labs: BNP (last 3 results) Recent Labs    09/08/19 1822 09/18/19 1248 09/20/19 0954  BNP 455.0* 601.0* 549.2*   Basic Metabolic Panel: Recent Labs  Lab 09/19/19 0223 09/19/19 0912 09/19/19 1640 09/20/19 0954 09/21/19 0047 09/22/19 0442 09/23/19 0656  NA 136  --   --  135 137 138 137  K 4.3  --   --  3.7 4.4 4.4 4.2  CL 99  --   --  96* 99 97* 94*  CO2 25  --   --  26 29 31  34*  GLUCOSE 159*  --   --  147* 115* 112* 117*  BUN 23  --   --  37* 29* 25* 22  CREATININE 2.03*  --   --  1.61* 1.40* 1.29* 1.27*  CALCIUM 8.8*  --   --  8.8* 8.6* 8.7* 9.0  MG 2.4   < > 2.4 2.3 2.5* 2.3 2.2  PHOS 4.4   < > 4.6 4.1 3.8 3.3 3.8   < > = values in this interval not displayed.   Liver Function Tests: Recent Labs  Lab 09/19/19 1345  PROT 6.7   No results for input(s): LIPASE, AMYLASE in the last 168 hours. No results for input(s): AMMONIA in the last 168 hours. CBC: Recent Labs  Lab 09/18/19 1247 09/18/19 1259 09/19/19 0223 09/20/19 0954 09/21/19 0047 09/22/19 0442 09/23/19 0656  WBC 12.3*   < > 7.8 10.9* 8.9 7.3 7.8  NEUTROABS 9.3*  --   --   --   --   --   --   HGB 15.1   < > 14.2 13.6 13.0 12.3* 13.0  HCT 48.6   < > 44.3 42.8 41.1 39.9 42.2  MCV 91.5   < > 90.0 89.7 90.9 92.8 92.1  PLT 238   < > 204 166 142* 132* 136*   < > = values in this interval not displayed.   Cardiac Enzymes: No results for input(s): CKTOTAL, CKMB, CKMBINDEX, TROPONINI in the last 168 hours. BNP: Invalid input(s): POCBNP CBG: Recent Labs  Lab 09/22/19 1630 09/22/19 2115 09/23/19 0222 09/23/19 0602 09/23/19 0806  GLUCAP 170* 134* 132* 136* 164*  D-Dimer No results for input(s): DDIMER in the last  72 hours. Hgb A1c No results for input(s): HGBA1C in the last 72 hours. Lipid Profile No results for input(s): CHOL, HDL, LDLCALC, TRIG, CHOLHDL, LDLDIRECT in the last 72 hours. Thyroid function studies No results for input(s): TSH, T4TOTAL, T3FREE, THYROIDAB in the last 72 hours.  Invalid input(s): FREET3 Anemia work up No results for input(s): VITAMINB12, FOLATE, FERRITIN, TIBC, IRON, RETICCTPCT in the last 72 hours. Urinalysis    Component Value Date/Time   COLORURINE YELLOW 05/23/2019 0213   APPEARANCEUR CLEAR 05/23/2019 0213   LABSPEC 1.011 05/23/2019 0213   PHURINE 5.0 05/23/2019 0213   GLUCOSEU NEGATIVE 05/23/2019 0213   HGBUR NEGATIVE 05/23/2019 0213   BILIRUBINUR NEGATIVE 05/23/2019 0213   KETONESUR NEGATIVE 05/23/2019 0213   PROTEINUR NEGATIVE 05/23/2019 0213   NITRITE NEGATIVE 05/23/2019 0213   LEUKOCYTESUR NEGATIVE 05/23/2019 0213   Sepsis Labs Invalid input(s): PROCALCITONIN,  WBC,  LACTICIDVEN Microbiology Recent Results (from the past 240 hour(s))  SARS Coronavirus 2 by RT PCR (hospital order, performed in [facility] hospital lab) Nasopharyngeal Nasopharyngeal Swab     Status: None   Collection Time: 09/18/19 12:50 PM   Specimen: Nasopharyngeal Swab  Result Value Ref Range Status   SARS Coronavirus 2 NEGATIVE NEGATIVE Final    Comment: (NOTE) SARS-CoV-2 target nucleic acids are NOT DETECTED.  The SARS-CoV-2 RNA is generally detectable in upper and lower respiratory specimens during the acute phase of infection. The lowest concentration of SARS-CoV-2 viral copies this assay can detect is 250 copies / mL. A negative result does not preclude SARS-CoV-2 infection and should not be used as the sole basis for treatment or other patient management decisions.  A negative result may occur with improper specimen collection / handling, submission of specimen other than nasopharyngeal swab, presence of viral mutation(s) within the areas targeted by this assay, and  inadequate number of viral copies (<250 copies / mL). A negative result must be combined with clinical observations, patient history, and epidemiological information.  Fact Sheet for Patients:   BoilerBrush.com.cy  Fact Sheet for Healthcare Providers: https://pope.com/  This test is not yet approved or  cleared by the [place] FDA and has been authorized for detection and/or diagnosis of SARS-CoV-2 by FDA under an Emergency Use Authorization (EUA).  This EUA will remain in effect (meaning this test can be used) for the duration of the COVID-19 declaration under Section 564(b)(1) of the Act, 21 U.S.C. section 360bbb-3(b)(1), unless the authorization is terminated or revoked sooner.  Performed at [facility], [address], [place], [place] [address]   MRSA PCR Screening     Status: None   Collection Time: 09/18/19  4:35 PM   Specimen: Nasopharyngeal  Result Value Ref Range Status   MRSA by PCR NEGATIVE NEGATIVE Final    Comment:        The GeneXpert MRSA Assay (FDA approved for NASAL specimens only), is one component of a comprehensive MRSA colonization surveillance program. It is not intended to diagnose MRSA infection nor to guide or monitor treatment for MRSA infections. Performed at [facility], [address] [place], [place] [address]   Body fluid culture     Status: None   Collection Time: 09/19/19 11:53 AM   Specimen: Body Fluid  Result Value Ref Range Status   Specimen Description FLUID LEFT PLEURAL  Final   Special Requests NONE  Final   Gram Stain   Final  FEW WBC PRESENT, PREDOMINANTLY MONONUCLEAR NO ORGANISMS SEEN    Culture   Final    NO GROWTH 3 DAYS Performed at [facility], [address], [place], [place] [address]    Report Status 09/22/2019 FINAL  Final     Time coordinating discharge: Over 30 minutes  SIGNED:   Cipriano Bunker, MD  [facility] 09/23/2019,  11:45 AM Pager   If 7PM-7AM, please contact night-coverage [email or web address]

## 2019-09-23 NOTE — Discharge Instructions (Signed)
Advised to follow-up with primary care physician in 1 week. Patient has ongoing appointment on Monday, September 14 and [facility] for cardioversion. Patient medication has been adjusted advised to continue as prescribed.    Information on my medicine - ELIQUIS (apixaban)  Why was Eliquis prescribed for you? Eliquis was prescribed for you to reduce the risk of a blood clot forming that can cause a stroke if you have a medical condition called atrial fibrillation (a type of irregular heartbeat).  What do You need to know about Eliquis ? Take your Eliquis TWICE DAILY - one tablet in the morning and one tablet in the evening with or without food. If you have difficulty swallowing the tablet whole please discuss with your pharmacist how to take the medication safely.  Take Eliquis exactly as prescribed by your doctor and DO NOT stop taking Eliquis without talking to the doctor who prescribed the medication.  Stopping may increase your risk of developing a stroke.  Refill your prescription before you run out.  After discharge, you should have regular check-up appointments with your healthcare provider that is prescribing your Eliquis.  In the future your dose may need to be changed if your kidney function or weight changes by a significant amount or as you get older.  What do you do if you miss a dose? If you miss a dose, take it as soon as you remember on the same day and resume taking twice daily.  Do not take more than one dose of ELIQUIS at the same time to make up a missed dose.  Important Safety Information A possible side effect of Eliquis is bleeding. You should call your healthcare provider right away if you experience any of the following: ? Bleeding from an injury or your nose that does not stop. ? Unusual colored urine (red or dark brown) or unusual colored stools (red or black). ? Unusual bruising for unknown reasons. ? A serious fall or if you hit your head (even  if there is no bleeding).  Some medicines may interact with Eliquis and might increase your risk of bleeding or clotting while on Eliquis. To help avoid this, consult your healthcare provider or pharmacist prior to using any new prescription or non-prescription medications, including herbals, vitamins, non-steroidal anti-inflammatory drugs (NSAIDs) and supplements.  This website has more information on Eliquis (apixaban): http://www.eliquis.com/eliquis/home

## 2019-09-23 NOTE — Care Management (Signed)
Patient has home O2 through the [place].  Patient left with his current O2 equipment before I could speak to him and did not voice any concerns about equipment.

## 2019-11-08 ENCOUNTER — Emergency Department (HOSPITAL_COMMUNITY): Payer: No Typology Code available for payment source

## 2019-11-08 ENCOUNTER — Encounter (HOSPITAL_COMMUNITY): Payer: Self-pay | Admitting: Emergency Medicine

## 2019-11-08 ENCOUNTER — Emergency Department (HOSPITAL_COMMUNITY)
Admission: EM | Admit: 2019-11-08 | Discharge: 2019-11-08 | Disposition: A | Discharge status: Home / Self Care | Payer: No Typology Code available for payment source | Attending: Emergency Medicine | Admitting: Emergency Medicine

## 2019-11-08 DIAGNOSIS — I472 Ventricular tachycardia, unspecified: Secondary | ICD-10-CM

## 2019-11-08 DIAGNOSIS — Z79899 Other long term (current) drug therapy: Secondary | ICD-10-CM | POA: Insufficient documentation

## 2019-11-08 DIAGNOSIS — R0789 Other chest pain: Secondary | ICD-10-CM | POA: Diagnosis present

## 2019-11-08 DIAGNOSIS — J449 Chronic obstructive pulmonary disease, unspecified: Secondary | ICD-10-CM | POA: Insufficient documentation

## 2019-11-08 DIAGNOSIS — Z955 Presence of coronary angioplasty implant and graft: Secondary | ICD-10-CM | POA: Diagnosis not present

## 2019-11-08 DIAGNOSIS — I480 Paroxysmal atrial fibrillation: Secondary | ICD-10-CM | POA: Diagnosis not present

## 2019-11-08 DIAGNOSIS — E039 Hypothyroidism, unspecified: Secondary | ICD-10-CM | POA: Diagnosis not present

## 2019-11-08 DIAGNOSIS — Z951 Presence of aortocoronary bypass graft: Secondary | ICD-10-CM | POA: Diagnosis not present

## 2019-11-08 DIAGNOSIS — Z7951 Long term (current) use of inhaled steroids: Secondary | ICD-10-CM | POA: Insufficient documentation

## 2019-11-08 DIAGNOSIS — Z95810 Presence of automatic (implantable) cardiac defibrillator: Secondary | ICD-10-CM | POA: Diagnosis not present

## 2019-11-08 DIAGNOSIS — Z7982 Long term (current) use of aspirin: Secondary | ICD-10-CM | POA: Insufficient documentation

## 2019-11-08 DIAGNOSIS — Z7901 Long term (current) use of anticoagulants: Secondary | ICD-10-CM | POA: Diagnosis not present

## 2019-11-08 DIAGNOSIS — Z20822 Contact with and (suspected) exposure to covid-19: Secondary | ICD-10-CM | POA: Insufficient documentation

## 2019-11-08 DIAGNOSIS — I219 Acute myocardial infarction, unspecified: Secondary | ICD-10-CM | POA: Insufficient documentation

## 2019-11-08 DIAGNOSIS — N189 Chronic kidney disease, unspecified: Secondary | ICD-10-CM | POA: Diagnosis not present

## 2019-11-08 DIAGNOSIS — I5042 Chronic combined systolic (congestive) and diastolic (congestive) heart failure: Secondary | ICD-10-CM | POA: Diagnosis not present

## 2019-11-08 DIAGNOSIS — Z87891 Personal history of nicotine dependence: Secondary | ICD-10-CM | POA: Diagnosis not present

## 2019-11-08 DIAGNOSIS — I130 Hypertensive heart and chronic kidney disease with heart failure and stage 1 through stage 4 chronic kidney disease, or unspecified chronic kidney disease: Secondary | ICD-10-CM | POA: Insufficient documentation

## 2019-11-08 LAB — CBC WITH DIFFERENTIAL/PLATELET
Abs Immature Granulocytes: 0.03 10*3/uL (ref 0.00–0.07)
Basophils Absolute: 0 10*3/uL (ref 0.0–0.1)
Basophils Relative: 0 %
Eosinophils Absolute: 0 10*3/uL (ref 0.0–0.5)
Eosinophils Relative: 0 %
HCT: 42.7 % (ref 39.0–52.0)
Hemoglobin: 13.5 g/dL (ref 13.0–17.0)
Immature Granulocytes: 0 %
Lymphocytes Relative: 22 %
Lymphs Abs: 1.8 10*3/uL (ref 0.7–4.0)
MCH: 29 pg (ref 26.0–34.0)
MCHC: 31.6 g/dL (ref 30.0–36.0)
MCV: 91.8 fL (ref 80.0–100.0)
Monocytes Absolute: 1.2 10*3/uL — ABNORMAL HIGH (ref 0.1–1.0)
Monocytes Relative: 14 %
Neutro Abs: 5.2 10*3/uL (ref 1.7–7.7)
Neutrophils Relative %: 64 %
Platelets: 171 10*3/uL (ref 150–400)
RBC: 4.65 MIL/uL (ref 4.22–5.81)
RDW: 16.6 % — ABNORMAL HIGH (ref 11.5–15.5)
WBC: 8.2 10*3/uL (ref 4.0–10.5)
nRBC: 0 % (ref 0.0–0.2)

## 2019-11-08 LAB — I-STAT CHEM 8, ED
BUN: 18 mg/dL (ref 8–23)
Calcium, Ion: 1.08 mmol/L — ABNORMAL LOW (ref 1.15–1.40)
Chloride: 96 mmol/L — ABNORMAL LOW (ref 98–111)
Creatinine, Ser: 1.5 mg/dL — ABNORMAL HIGH (ref 0.61–1.24)
Glucose, Bld: 142 mg/dL — ABNORMAL HIGH (ref 70–99)
HCT: 41 % (ref 39.0–52.0)
Hemoglobin: 13.9 g/dL (ref 13.0–17.0)
Potassium: 3.9 mmol/L (ref 3.5–5.1)
Sodium: 137 mmol/L (ref 135–145)
TCO2: 29 mmol/L (ref 22–32)

## 2019-11-08 LAB — TROPONIN I (HIGH SENSITIVITY)
Troponin I (High Sensitivity): 45 ng/L — ABNORMAL HIGH (ref <18)
Troponin I (High Sensitivity): 45 ng/L — ABNORMAL HIGH (ref <18)

## 2019-11-08 LAB — PROTIME-INR
INR: 1.4 — ABNORMAL HIGH (ref 0.8–1.2)
Prothrombin Time: 16.5 seconds — ABNORMAL HIGH (ref 11.4–15.2)

## 2019-11-08 LAB — PHOSPHORUS: Phosphorus: 4.5 mg/dL (ref 2.5–4.6)

## 2019-11-08 LAB — COMPREHENSIVE METABOLIC PANEL
ALT: 18 U/L (ref 0–44)
AST: 20 U/L (ref 15–41)
Albumin: 3.5 g/dL (ref 3.5–5.0)
Alkaline Phosphatase: 81 U/L (ref 38–126)
Anion gap: 10 (ref 5–15)
BUN: 14 mg/dL (ref 8–23)
CO2: 29 mmol/L (ref 22–32)
Calcium: 9.1 mg/dL (ref 8.9–10.3)
Chloride: 97 mmol/L — ABNORMAL LOW (ref 98–111)
Creatinine, Ser: 1.53 mg/dL — ABNORMAL HIGH (ref 0.61–1.24)
GFR, Estimated: 50 mL/min — ABNORMAL LOW (ref >60)
Glucose, Bld: 140 mg/dL — ABNORMAL HIGH (ref 70–99)
Potassium: 3.9 mmol/L (ref 3.5–5.1)
Sodium: 136 mmol/L (ref 135–145)
Total Bilirubin: 1.1 mg/dL (ref 0.3–1.2)
Total Protein: 6.8 g/dL (ref 6.5–8.1)

## 2019-11-08 LAB — RESPIRATORY PANEL BY RT PCR (FLU A&B, COVID)
Influenza A by PCR: NEGATIVE
Influenza B by PCR: NEGATIVE
SARS Coronavirus 2 by RT PCR: NEGATIVE

## 2019-11-08 LAB — MAGNESIUM: Magnesium: 2.1 mg/dL (ref 1.7–2.4)

## 2019-11-08 LAB — BRAIN NATRIURETIC PEPTIDE: B Natriuretic Peptide: 1502.1 pg/mL — ABNORMAL HIGH (ref 0.0–100.0)

## 2019-11-08 LAB — TSH: TSH: 4.947 u[IU]/mL — ABNORMAL HIGH (ref 0.350–4.500)

## 2019-11-08 MED ORDER — ETOMIDATE 2 MG/ML IV SOLN
12.0000 mg | Freq: Once | INTRAVENOUS | Status: DC
  Filled 2019-11-08: qty 10

## 2019-11-08 NOTE — Consult Note (Addendum)
Cardiology Consultation:   Patient ID: Max Rivas MRN: [id number]; DOB: Jul 21, 1950  Admit date: 11/08/2019 Date of Consult: 11/08/2019  Primary Care Provider: Anson Fret, MD [facility] Cardiologist: Dr. Jillyn Hidden [facility] [place]) [facility] Electrophysiologist:  Lewayne Bunting, MD  >>> Dr. Rhea Belton Shantha [facility])    Patient Profile:   Max Rivas is a 69 y.o. 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 ICM with EF 40-45%. History of PAF on eliquis with recent [facility] 8/31 for afib. History of single lead AICD and recurrent VT RX with oral amiodarone and mexiletine  who is being seen today for the evaluation of VT at the request of Dr. Hardie Lora.  He underwent EPS/ablation 09/26/19 seems a PVC/VT ablation, his record notes: A comprehensive EP study was performed and patient underwent successful radiofrequency ablation of his arrhythmia, with successful cardioversion Complicated by R groin hematoma requiring surgical repair 09/27/19 09/29/19 had gen change Discharge 10/03/19 Discharge Diagnoses:  ICD fires for VT s/p PVC/VT ablation Right femoral artery pseudoaneurysm ICD ERI s/p generator change    He has a long hx of VT and ICD therapies (appropriate)  Device information MDT single chamber implanted/new RV lead and device3/07/2011 >> gen change 09/29/2019 Initial implant 2005(RV lead was 6949 lead, is abandoned)  + hx of appropriate therapies AAD amiodarone (on historically) and restarted March 2021  History of Present Illness:   Max Rivas last night at approx 10:00PM noted onset of palpitations, associated with some slight chest discomfort, and increase from his baseline SOB.  He checked his BP and got 130's/101 and his HR 150's.  He was not feeling particulay bad so did not reach out to any one or take any additional meds.  This morning woke noting the same and [facility], they got a remote transmission and told him he was in VT and should go to the hospital.  Here is was found in a WCT 130's, the patient tolerating well. He has no ongoing sensatio of any chest pressure, or unusual SOB. He has not missed any doses of any of his medicines, including his Eliquis in the last 3 weeks.  Labs look OK  His med list from care everywhere he reports is accurate and is takling amiodarone  daily and the mexiletine  BID Eliquis for his Ahfib  BID Reports compliance with all of his medicines.  He denies any dizziness, no near syncope or syncope. He has not been shocked   Past Medical History:  Diagnosis Date  . Arthritis    "elbows, knees" (02/24/2016)  . Bronchitis 2006  . CAD (coronary artery disease)    a. s/p prior MIs - 1995 x 2, 1998; b. s/p prior LCX stenting; c. 04/2019 Cath: LM min irregs, LAD 65p/mi, D1 75, LCX 99ost/p, 32m (ISR), OM2 80, RCA/RPDA mod diff dzs; d. 05/2019 CABG x 3: LIMA->LAD, VG->Diag, VG->OM.  Marland Kitchen COPD (chronic obstructive pulmonary disease) (HCC)    a. Remote tobacco-->on home O2.  Marland Kitchen GERD (gastroesophageal reflux disease)   . 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 05/2019 Echo: EF 40-45%, gr2 DD. Nl RV size/fxn. Mildly dil LA. Mild MR.  . High cholesterol   . Ischemic cardiomyopathy    a. 05/2019 Echo: EF 40-45%.  . Morbid obesity (HCC)   . Myocardial infarction [facility]) ~ 1995 X 2;1998; 2000;  2004  . On home oxygen therapy    "2L w/activity" (02/24/2016)  . PAF (paroxysmal atrial fibrillation) (HCC)    a. CHA2DS2VASc = 4-->eliquis. Also on amio.  . Pulmonary embolism (HCC) 02/24/2016  . Sleep apnea    "have CPAP; can't tolerate it" (02/24/2016)  . Ventricular tachycardia (HCC)    a. 2005 s/p ICD; b. 03/2011 Device upgrade/lead exchange: MDT Protecta XT VR single lead AICD.    Past Surgical History:  Procedure Laterality Date  . CARDIOVERSION N/A 09/11/2019   Procedure: CARDIOVERSION;   Surgeon: Jodelle Red, MD;  Location[facility] ENDOSCOPY;  Service: Cardiovascular;  Laterality: N/A;  . CLIPPING OF ATRIAL APPENDAGE N/A 05/23/2019   Procedure: CLIPPING OF ATRIAL APPENDAGE with atriclip;  Surgeon: [name], MD;  Location: [facility] OR;  Service: Open Heart Surgery;  Laterality: N/A;  . CORONARY ANGIOPLASTY  1995  . CORONARY ANGIOPLASTY WITH STENT PLACEMENT  ~ 1995 - 2004 X 5   "I've got a total of 5 stents" (02/24/2016)  . CORONARY ARTERY BYPASS GRAFT N/A 05/23/2019   Procedure: CORONARY ARTERY BYPASS GRAFTING (CABG), times three, using right greater saphenous vein and left internal mammary;  Surgeon: [name], MD;  Location: [facility] OR;  Service: Open Heart Surgery;  Laterality: N/A;  [place] only  . 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 INTRAVASCULAR PRESSURE WIRE/FFR STUDY N/A 05/12/2019   Procedure: INTRAVASCULAR PRESSURE WIRE/FFR STUDY;  Surgeon: Lyn Records, MD;  Location: [facility] INVASIVE CV LAB;  Service: Cardiovascular;  Laterality: N/A;  . LEFT HEART CATH AND CORONARY ANGIOGRAPHY N/A 05/12/2019   Procedure: LEFT HEART CATH AND CORONARY ANGIOGRAPHY;  Surgeon: Lyn Records, MD;  Location: [facility] INVASIVE CV LAB;  Service: Cardiovascular;  Laterality: N/A;  . PACEMAKER GENERATOR CH[facility]  [facility]    [facility]   . TEE WITHOUT CARDIOVERSION N/A 05/23/2019   Procedure: TRANSESOPHAGEAL ECHOCARDIOGRAM (TEE);  Surgeon: [name], MD;  Location: [facility] OR;  Service: Open Heart Surgery;  Laterality: N/A;  . TONSILLECTOMY       Home Medications:  Prior to Admission medications   Medication Sig Start Date End Date Taking? Authorizing Provider  acetaminophen (TYLENOL) 325 MG tablet Take 2 tablets (650 mg total) by mouth every 6 (six) hours as needed for mild pain (or Fever >/= 101). 03/05/16   Alison Murray, MD  apixaban (ELIQUIS) 5 MG TABS tablet Take 5 mg by mouth 2 (two) times daily.    [provider]  aspirin 81  MG chewable tablet Chew 1 tablet (81 mg total) by mouth daily. 05/16/19   Kroeger, Ovidio Kin., PA-C  atorvastatin (LIPITOR) 80 MG tablet Take 1 tablet (80 mg total) by mouth daily. 05/15/19   Kroeger, Ovidio Kin., PA-C  carboxymethylcellulose (REFRESH PLUS) 0.5 % SOLN Place 1 drop into both eyes at bedtime.    [provider]  Cholecalciferol (VITAMIN D3) 50 MCG (2000 UT) TABS Take 2,000 Units by mouth daily.    [provider]  furosemide (LASIX) 40 MG tablet Take 40-80 mg by mouth 2 (two) times daily. Take 2 tablets (80 mg) in the morning and Take 1 tablet (40 mg) at bedtime    [provider]  gabapentin (NEURONTIN) 100 MG capsule Take 2 capsules (200 mg total) by mouth 2 (two) times daily. 06/01/19   Barrett, Erin R, PA-C  levalbuterol (XOPENEX HFA) 45 MCG/ACT inhaler Inhale 2 puffs into the  lungs every 6 (six) hours as needed for wheezing or shortness of breath.    [provider]  levothyroxine (SYNTHROID) 75 MCG tablet Take 75 mcg by mouth daily before breakfast.    [provider]  metoprolol succinate (TOPROL-XL) 25 MG 24 hr tablet Take 12.5 mg by mouth daily.    [provider]  mexiletine (MEXITIL) 150 MG capsule Take 150 mg by mouth 2 (two) times daily.    [provider]  mometasone [facility]) 220 MCG/INH inhaler Inhale 2 puffs into the lungs 2 (two) times daily.     [provider]  montelukast (SINGULAIR) 10 MG tablet Take 10 mg by mouth daily.     [provider]  Multiple Vitamin (MULTIVITAMIN WITH MINERALS) TABS tablet Take 1 tablet by mouth daily.    [provider]  nitroGLYCERIN (NITROSTAT) 0.4 MG SL tablet Place 0.4 mg under the tongue every 5 (five) minutes as needed for chest pain.     [provider]  omega-3 acid ethyl esters (LOVAZA) 1 g capsule Take 1 capsule (1 g total) by mouth 2 (two) times daily. 03/05/16   Alison Murray, MD  omeprazole (PRILOSEC) 20 MG capsule Take 20 mg by mouth  2 (two) times daily before a meal.    [provider]  predniSONE (DELTASONE) 20 MG tablet Take 2 tablets (40 mg total) by mouth daily with breakfast. 09/24/19   Cipriano Bunker, MD  Propylene Glycol (SYSTANE BALANCE) 0.6 % SOLN Place 1 drop into both eyes See admin instructions. Place 1 drop into each eye four times a day and apply a warm compress for 10 minutes afterwards    [provider]  Tiotropium Bromide-Olodaterol (STIOLTO RESPIMAT) 2.5-2.5 MCG/ACT AERS Inhale 2 puffs into the lungs daily.     [provider]    Inpatient Medications: Scheduled Meds:  Continuous Infusions:  PRN Meds:   Allergies:    Allergies  Allergen Reactions  . Crestor [Rosuvastatin Calcium] Other (See Comments)    Back spasms, but can tolerate it at a low dose now    Social History:   Social History   Socioeconomic History  . Marital status: Divorced    Spouse name: Not on file  . Number of children: Not on file  . Years of education: Not on file  . Highest education level: Not on file  Occupational History  . Not on file  Tobacco Use  . Smoking status: Former Smoker    Packs/day: 3.00    Years: 35.00    Pack years: 105.00    Types: Cigarettes    Quit date: 05/22/2019    Years since quitting: 0.4  . Smokeless tobacco: Former Neurosurgeon    Quit date: 05/21/2017  Vaping Use  . Vaping Use: Never used  Substance and Sexual Activity  . Alcohol use: Not Currently  . Drug use: Not Currently    Types: Cocaine    Comment: none since 1995  . Sexual activity: Yes  Other Topics Concern  . Not on file  Social History Narrative  . Not on file   Social Determinants of Health   Financial Resource Strain:   . Difficulty of Paying Living Expenses: Not on file  Food Insecurity:   . Worried About Programme researcher, broadcasting/film/video in the Last Year: Not on file  . Ran Out of Food in the Last Year: Not on file  Transportation Needs:   . Lack of Transportation (Medical): Not on file  .  Lack of  Transportation (Non-Medical): Not on file  Physical Activity:   . Days of Exercise per Week: Not on file  . Minutes of Exercise per Session: Not on file  Stress:   . Feeling of Stress : Not on file  Social Connections:   . Frequency of Communication with Friends and Family: Not on file  . Frequency of Social Gatherings with Friends and Family: Not on file  . Attends Religious Services: Not on file  . Active Member of Clubs or Organizations: Not on file  . Attends Banker Meetings: Not on file  . Marital Status: Not on file  Intimate Partner Violence:   . Fear of Current or Ex-Partner: Not on file  . Emotionally Abused: Not on file  . Physically Abused: Not on file  . Sexually Abused: Not on file    Family History:   Family History  Problem Relation Age of Onset  . Heart attack Mother   . Heart attack Father   . Heart attack Sister 34     ROS:  Please see the history of present illness.  All other ROS reviewed and negative.     Physical Exam/Data:   Vitals:   11/08/19 1243  BP: 97/61  Pulse: (!) 138  Resp: (!) 22  Temp: 97.6 F (36.4 C)  TempSrc: Oral  SpO2: 100%   No intake or output data in the 24 hours ending 11/08/19 1356 Last 3 Weights 09/23/2019 09/22/2019 09/21/2019  Weight (lbs) 213 lb 9.6 oz 217 lb 218 lb 1.6 oz  Weight (kg) 96.888 kg 98.431 kg 98.93 kg     There is no height or weight on file to calculate BMI.  General:  Well nourished, well developed, in no acute distress HEENT: normal Lymph: no adenopathy Neck: no JVD Endocrine:  No thryomegaly Vascular: No carotid bruits; FA pulses 2+ bilaterally without bruits  Cardiac:  RRR; tachycardic, no murmurs, gallops or rubs Lungs:  CTA b/l, no wheezing, rhonchi or rales  Abd: soft, nontender Ext: trace edema Musculoskeletal:  No deformities Skin: warm and dry  Neuro:  no focal abnormalities noted Psych:  Normal affect   EKG:  The EKG was personally reviewed and demonstrates:    #1 WCT  139bpm, , RBBB, LAD #2 SR  79bpm,nonspecific T changes  Telemetry:  Telemetry was personally reviewed and demonstrates:   WCT/VT 136bpm >> SR 70's  Relevant CV Studies:  09/09/2019 TTE IMPRESSIONS  1. Left ventricular ejection fraction, by estimation, is 30 to 35%. The  left ventricle has moderately decreased function. The left ventricle  demonstrates global hypokinesis. The left ventricular internal cavity size  was moderately dilated. Left  ventricular diastolic parameters are consistent with Grade I diastolic  dysfunction (impaired relaxation).  2. Right ventricular systolic function is moderately reduced. The right  ventricular size is mildly enlarged. There is moderately elevated  pulmonary artery systolic pressure.  3. Left atrial size was severely dilated.  4. Right atrial size was mildly dilated.  5. The mitral valve is normal in structure. Mild mitral valve  regurgitation. No evidence of mitral stenosis.  6. The aortic valve is normal in structure. Aortic valve regurgitation is  not visualized. No aortic stenosis is present.  7. The inferior vena cava is dilated in size with <50% respiratory  variability, suggesting right atrial pressure of 15 mmHg.    05/12/2019: LHC  Recurrent, probably ischemically mediated ventricular tachycardia with multiple shocks.  High-grade native and diffuse in-stent restenosis ostial to  mid circumflex 95 to 99%. There is also a 90% stenosis beyond the stented segment.  Widely patent left main  Eccentric bulky and calcified 50 to 65% proximal to mid LAD beyond the origin of the large first diagonal. Eccentric bulky and calcified ostial to proximal 70% first diagonal. Both LAD and diagonal are mildly hemodynamically significant based upon RFR values 0.89 and 0.87, respectively. RFR 0.89 or less is hemodynamically significant.  The right coronary particularly in the mid segment contains diffuse atherosclerosis but no focal high-grade  obstruction.  Left ventriculography is not performed because of renal insufficiency. LVEDP is normal measuring 10 mmHg.  RECOMMENDATIONS:   We need to determine if the patient has any surgical options. The likelihood of durable circumflex result is low due to the diffuse nature of disease and the need to place stents over a long segment, most of which would be stent sandwich. The LAD and diagonal are mildly hemodynamically significant and could be grafted.  If PCI, Rodarius Kichline need orbital atherectomy over a long segment then reexpansion of the in-stent restenosis and if resistant, possibly shockwave PCI followed by extensive ostial to mid vessel restenting.   [facility]; Echocardoigram march 2021 Mild biatrial enlargement Dilated LV EF 30-35% Posterior akinesis, and Lumason uptake in the myocardium c/w prior posterior infarct Grade II diastolic dysfunction Normal RV size and function Mod MR Normal caliber IVC  No pericardial effusion Pacer wire right heart   06/22/2018: TTE IMPRESSIONS  1. Suboptimal acoustic windows significantly degrade diagnostic capacity  of study.  2. The left ventricle has moderate-severely reduced systolic function,  with an ejection fraction of 30-35%. The cavity size was normal. Left  ventricular diastolic Doppler parameters are consistent with  pseudonormalization. Elevated left ventricular  end-diastolic pressure The E/e' is 16.  3. The inferior vena cava was dilated in size with >50% respiratory  Variability.   Laboratory Data:  High Sensitivity Troponin:  No results for input(s): TROPONINIHS in the last 720 hours.   Chemistry Recent Labs  Lab 11/08/19 1332  NA 137  K 3.9  CL 96*  GLUCOSE 142*  BUN 18  CREATININE 1.50*    No results for input(s): PROT, ALBUMIN, AST, ALT, ALKPHOS, BILITOT in the last 168 hours. Hematology Recent Labs  Lab 11/08/19 1320 11/08/19 1332  WBC 8.2  --   RBC 4.65  --   HGB 13.5 13.9  HCT  42.7 41.0  MCV 91.8  --   MCH 29.0  --   MCHC 31.6  --   RDW 16.6*  --   PLT 171  --    BNPNo results for input(s): BNP, PROBNP in the last 168 hours.  DDimer No results for input(s): DDIMER in the last 168 hours.   Radiology/Studies:  DG Chest Port 1 View Result Date: 11/08/2019 CLINICAL DATA:  LEFT side chest pain and tachycardia for 2 days, history coronary artery disease post MI and cardiac surgery, COPD, GERD, former smoker EXAM: PORTABLE CHEST 1 VIEW COMPARISON:  Portable exam 1306 hours compared to 09/21/2019 FINDINGS: LEFT subclavian ICD with leads projecting over RIGHT ventricle. Upper normal size of cardiac silhouette post median sternotomy and LEFT atrial appendage clipping. Atherosclerotic calcification aorta. RIGHT fibrothorax changes again identified. Persistent infiltrate in mid RIGHT lung. Chronic interstitial disease changes at both lung bases. No definite acute infiltrate, pleural effusion or pneumothorax. 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 Persistent RIGHT mid lung infiltrate. Post cardiac surgery changes and ICD. No acute abnormalities. Electronically Signed  By: Ulyses Southward M.D.   On: 11/08/2019 13:33     Assessment and Plan:   1. VT     Well below his detection     Largely well tolerated though his BP was dipping to high 80's       Burst pacing was attempted a number of times though failed and we were going to external CV him, though Dr. Elberta Fortis was able to be pace terminate with ramp 91%,  and his device programmed with a VT zone 133bpm, with ATPs only, no shocks in this zone.  He was advised to increase his amiodarone from 200mg  daily to 400mg  daily  And  Mexiletine 150mg  BID to 300mg  BID  He reports [facility] his very accessible and he they are aware of his VT.  He Verena Shawgo call today and is confident that he can get seen by Monday by Dr. if not Dr. Shantha His BP immediately improved to SBP  115  He was given return precautions   { For questions or updates, please contact [facility] Please consult [email or web address] for contact info under    Signed, , PA-C  11/08/2019 1:56 PM  I have seen and examined this patient with Jillyn Hidden.  Agree with above, note added to reflect my findings.  On exam, RRR, no murmurs, lungs clear.  Patient presented to the emergency room after [facility] due to persistent ventricular tachycardia.  VT was at a rate of 135 bpm.  The patient has a history of an ischemic cardiomyopathy and has had a ventricular tachycardia ablation in September 2021 at [facility].  He is currently on amiodarone.  In the emergency room, he was paced out of ventricular tachycardia with his ICD.  Once he was paced out, he no longer felt weak or fatigued, and felt well.  His blood pressure immediately came up to 115 systolic from the 90s.  At this point, we Clarke Peretz hold off on further hospital therapy.  We Wolfe Camarena increase his amiodarone to 400 mg daily and his mexiletine to 300 mg twice daily.  We have adjusted his ICD and given him a VT zone at 133 bpm with 6 rounds of ATP.  He Bryann Mcnealy follow up with his primary cardiologist as an outpatient.  Laury Huizar M. Jeffie Spivack MD 11/08/2019 3:14 PM

## 2019-11-08 NOTE — Discharge Instructions (Addendum)
1. Make medication changes as discussed with cardiology. 2. Follow-up with cardiology as planned. 3. Return to the emergency department immediately if you have racing heart, chest pain or other concerning symptoms.

## 2019-11-08 NOTE — ED Provider Notes (Signed)
[name] EMERGENCY DEPARTMENT Provider Note   CSN: [id number] Arrival date & time: 11/08/19  1236     History Chief Complaint  Patient presents with  . Tachycardia    RAMIZ TURPIN is a 69 y.o. male.  HPI Patient from home with [facility] for evaluation of chest pain that occurred last night. [facility], [facility]. EMS reports patient initially in wide-complex tachycardia with BP 80/60, given 150mg  amiodarone PTA, BP improved to 96/86.Patient has implanted [employer]tron[employer] defibrillator, history of 5x MI, and has had defibrillator replaced 3 times, wears 4L O2 [place] at baseline. Patient has no complaints at this time.  Patient has history of V. tach.  He has a pacer defibrillator that was replaced September 14/2021 at [facility] by Dr[facility] Sh[facility].  He also had a VT ablation done at that time.  Patient reports he is compliant with his medications.  He has taken his amiodarone and Eliquis this morning.  He took usual doses last night.  He also takes Toprol XL and reports Mexitil as a new medication for him after his ablation.  Yesterday evening, patient reports he did have some chest pain.  It had pressure quality was very uncomfortable.  He took his medications and waited it out.  He reports he could feel his heart racing.  His heart rate was ranging from 130s to 150s.  He reports his blood pressures were high last night.  [facility] and [facility].  They advised him to go to the emergency department.  Patient reports he no longer has any chest pain today.  He does not feel lightheaded at this time.  He has not had any syncopal episodes.  He has not been experiencing any pain in his calves or swelling in his legs.     Past Medical History:  Diagnosis Date  . Arthritis    "elbows, knees" (02/24/2016)  . Bronchitis 2006  . CAD (coronary artery disease)     a. s/p prior MIs - 1995 x 2, 1998; b. s/p prior LCX stenting; c. 04/2019 Cath: LM min irregs, LAD 65p/mi, D1 75, LCX 99ost/p, 18m (ISR), OM2 80, RCA/RPDA mod diff dzs; d. 05/2019 CABG x 3: LIMA->LAD, VG->Diag, VG->OM.  06/2019 COPD (chronic obstructive pulmonary disease) (HCC)    a. Remote tobacco-->on home O2.  Marland Kitchen GERD (gastroesophageal reflux disease)   . 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 05/2019 Echo: EF 40-45%, gr2 DD. Nl RV size/fxn. Mildly dil LA. Mild MR.  . High cholesterol   . Ischemic cardiomyopathy    a. 05/2019 Echo: EF 40-45%.  . Morbid obesity (HCC)   . Myocardial infarction [facility]) ~ 1995 X 2;1998; 2000; 2004  . On home oxygen therapy    "2L w/activity" (02/24/2016)  . PAF (paroxysmal atrial fibrillation) (HCC)    a. CHA2DS2VASc = 4-->eliquis. Also on amio.  . Pulmonary embolism (HCC) 02/24/2016  . Sleep apnea    "have CPAP; can't tolerate it" (02/24/2016)  . Ventricular tachycardia (HCC)    a. 2005 s/p ICD; b. 03/2011 Device upgrade/lead exchange: MDT Protecta XT VR single lead AICD.    Patient Active Problem List   Diagnosis Date Noted  . Vomiting   . Acute respiratory failure with hypoxia (HCC) 09/18/2019  . Acute on chronic heart failure (HCC) 09/08/2019  . Unstable angina (HCC) 05/21/2019  .  V-tach (HCC) 05/12/2019  . Chest pain   . VT (ventricular tachycardia) (HCC) 05/11/2019  . Arrhythmia 03/12/2019  . On home O2   . Chronic combined systolic and diastolic CHF (congestive heart failure) (HCC) 06/22/2018  . Chronic respiratory failure with hypoxia (HCC) 06/22/2018  . Congestive heart failure (CHF) (HCC) 06/22/2018  . DCM (dilated cardiomyopathy) (HCC) 05/25/2017  . Coronary artery disease 05/25/2017  . Chronic atrial fibrillation 05/25/2017  . COPD (chronic obstructive pulmonary disease) (HCC) 05/25/2017  . CKD (chronic kidney disease) 05/25/2017  . Hypothyroidism 05/25/2017  . ICD (implantable cardioverter-defibrillator) in place  05/25/2017  . OSA (obstructive sleep apnea) 05/25/2017  . Pulmonary HTN (HCC) 05/25/2017  . Mediastinal adenopathy   . Cervical adenopathy   . Acute respiratory failure with hypoxia and hypercarbia (HCC) 02/24/2016  . Pulmonary embolus (HCC) 02/24/2016  . Pulmonary artery thrombosis (HCC)   . Pleural effusion   . Lung mass   . Pleural plaque   . Flutter-fibrillation 01/15/2016  . Acute systolic congestive heart failure (HCC)   . Atrial fibrillation with RVR (HCC)   . Hypercholesterolemia 03/11/2006  . Tobacco abuse 03/11/2006  . Acute on chronic systolic congestive heart failure (HCC) 03/11/2006  . GASTROESOPHAGEAL REFLUX, NO ESOPHAGITIS 03/11/2006  . OSTEOARTHRITIS, MULTI SITES 03/11/2006  . HIGH RISK PATIENT 03/11/2006  . Tobacco dependence 03/11/2006    Past Surgical History:  Procedure Laterality Date  . CARDIOVERSION N/A 09/11/2019   Procedure: CARDIOVERSION;  Surgeon: Jodelle Red, MD;  Location: [facility] ENDOSCOPY;  Service: Cardiovascular;  Laterality: N/A;  . CLIPPING OF ATRIAL APPENDAGE N/A 05/23/2019   Procedure: CLIPPING OF ATRIAL APPENDAGE with atriclip;  Surgeon: [name], MD;  Location: [facility] OR;  Service: Open Heart Surgery;  Laterality: N/A;  . CORONARY ANGIOPLASTY  1995  . CORONARY ANGIOPLASTY WITH STENT PLACEMENT  ~ 1995 - 2004 X 5   "I've got a total of 5 stents" (02/24/2016)  . CORONARY ARTERY BYPASS GRAFT N/A 05/23/2019   Procedure: CORONARY ARTERY BYPASS GRAFTING (CABG), times three, using right greater saphenous vein and left internal mammary;  Surgeon: [name], MD;  Location: [facility] OR;  Service: Open Heart Surgery;  Laterality: N/A;  [place] only  . 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 INTRAVASCULAR PRESSURE WIRE/FFR STUDY N/A 05/12/2019   Procedure: INTRAVASCULAR PRESSURE WIRE/FFR STUDY;  Surgeon: Lyn Records, MD;  Location: [facility] INVASIVE CV LAB;  Service: Cardiovascular;  Laterality: N/A;   . LEFT HEART CATH AND CORONARY ANGIOGRAPHY N/A 05/12/2019   Procedure: LEFT HEART CATH AND CORONARY ANGIOGRAPHY;  Surgeon: Lyn Records, MD;  Location: [facility] INVASIVE CV LAB;  Service: Cardiovascular;  Laterality: N/A;  . PACEMAKER GENERATOR CHANGE  03/2011    [facility]  . TEE WITHOUT CARDIOVERSION N/A 05/23/2019   Procedure: TRANSESOPHAGEAL ECHOCARDIOGRAM (TEE);  Surgeon: [name], MD;  Location: [facility] OR;  Service: Open Heart Surgery;  Laterality: N/A;  . TONSILLECTOMY         Family History  Problem Relation Age of Onset  . Heart attack Mother   . Heart attack Father   . Heart attack Sister 19    Social History   Tobacco Use  . Smoking status: Former Smoker    Packs/day: 3.00    Years: 35.00    Pack years: 105.00    Types: Cigarettes    Quit date: 05/22/2019    Years since quitting: 0.4  .  Smokeless tobacco: Former Neurosurgeon    Quit date: 05/21/2017  Vaping Use  . Vaping Use: Never used  Substance Use Topics  . Alcohol use: Not Currently  . Drug use: Not Currently    Types: Cocaine    Comment: none since 1995    Home Medications Prior to Admission medications   Medication Sig Start Date End Date Taking? Authorizing Provider  acetaminophen (TYLENOL) 325 MG tablet Take 2 tablets (650 mg total) by mouth every 6 (six) hours as needed for mild pain (or Fever >/= 101). 03/05/16  Yes Alison Murray, MD  apixaban (ELIQUIS) 5 MG TABS tablet Take 5 mg by mouth 2 (two) times daily.   Yes [provider]  aspirin 81 MG chewable tablet Chew 1 tablet (81 mg total) by mouth daily. 05/16/19  Yes Kroeger, Dot Lanes M., PA-C  atorvastatin (LIPITOR) 80 MG tablet Take 1 tablet (80 mg total) by mouth daily. Patient taking differently: Take 80 mg by mouth every evening.  05/15/19  Yes Kroeger, Dot Lanes M., PA-C  carboxymethylcellulose (REFRESH PLUS) 0.5 % SOLN Place 1 drop into both eyes at bedtime.   Yes [provider]  Cholecalciferol (VITAMIN D3) 50 MCG (2000 UT) TABS Take 2,000  Units by mouth daily.   Yes [provider]  furosemide (LASIX) 40 MG tablet Take 40 mg by mouth 2 (two) times daily.    Yes [provider]  gabapentin (NEURONTIN) 100 MG capsule Take 2 capsules (200 mg total) by mouth 2 (two) times daily. 06/01/19  Yes Barrett, Erin R, PA-C  levalbuterol (XOPENEX HFA) 45 MCG/ACT inhaler Inhale 2 puffs into the lungs every 6 (six) hours as needed for wheezing or shortness of breath.   Yes [provider]  levothyroxine (SYNTHROID) 75 MCG tablet Take 75 mcg by mouth daily before breakfast.   Yes [provider]  metoprolol succinate (TOPROL-XL) 25 MG 24 hr tablet Take 12.5 mg by mouth daily.   Yes [provider]  mexiletine (MEXITIL) 150 MG capsule Take 150 mg by mouth 2 (two) times daily.   Yes [provider]  mometasone (ASMANEX) 220 MCG/INH inhaler Inhale 2 puffs into the lungs 2 (two) times daily.    Yes [provider]  montelukast (SINGULAIR) 10 MG tablet Take 10 mg by mouth daily.    Yes [provider]  Multiple Vitamin (MULTIVITAMIN WITH MINERALS) TABS tablet Take 1 tablet by mouth daily.   Yes [provider]  nitroGLYCERIN (NITROSTAT) 0.4 MG SL tablet Place 0.4 mg under the tongue every 5 (five) minutes as needed for chest pain.    Yes [provider]  omega-3 acid ethyl esters (LOVAZA) 1 g capsule Take 1 capsule (1 g total) by mouth 2 (two) times daily. 03/05/16  Yes Alison Murray, MD  omeprazole (PRILOSEC) 20 MG capsule Take 20 mg by mouth 2 (two) times daily before a meal.   Yes [provider]  predniSONE (DELTASONE) 20 MG tablet Take 2 tablets (40 mg total) by mouth daily with breakfast. 09/24/19  Yes Cipriano Bunker, MD  Propylene Glycol (SYSTANE BALANCE) 0.6 % SOLN Place 1 drop into both eyes See admin instructions. Place 1 drop into each eye four times a day and apply a warm compress for 10 minutes afterwards   Yes [provider]  Tiotropium  Bromide-Olodaterol (STIOLTO RESPIMAT) 2.5-2.5 MCG/ACT AERS Inhale 2 puffs into the lungs daily.    Yes [provider]    Allergies    Crestor [  rosuvastatin calcium]  Review of Systems   Review of Systems 10 systems reviewed and negative except as per HPI Physical Exam Updated Vital Signs BP (!) 97/57   Pulse 70   Temp 97.6 F (36.4 C) (Oral)   Resp (!) 23   SpO2 100%   Physical Exam Constitutional:      Comments: Alert and nontoxic in appearance.  No respiratory distress at rest.  HENT:     Head: Normocephalic and atraumatic.     Mouth/Throat:     Pharynx: Oropharynx is clear.  Cardiovascular:     Pulses: Normal pulses.     Comments: Tachycardia. Pulmonary:     Effort: Pulmonary effort is normal.     Breath sounds: Normal breath sounds.  Abdominal:     General: There is no distension.     Palpations: Abdomen is soft.     Tenderness: There is no abdominal tenderness. There is no guarding.  Musculoskeletal:     Comments: No calf tenderness.  Calves are soft and pliable.  No significant peripheral edema.  Skin:    General: Skin is warm and dry.  Neurological:     General: No focal deficit present.     Mental Status: He is oriented to person, place, and time.     Coordination: Coordination normal.  Psychiatric:        Mood and Affect: Mood normal.     ED Results / Procedures / Treatments   Labs (all labs ordered are listed, but only abnormal results are displayed) Labs Reviewed  COMPREHENSIVE METABOLIC PANEL - Abnormal; Notable for the following components:      Result Value   Chloride 97 (*)    Glucose, Bld 140 (*)    Creatinine, Ser 1.53 (*)    GFR, Estimated 50 (*)    All other components within normal limits  BRAIN NATRIURETIC PEPTIDE - Abnormal; Notable for the following components:   B Natriuretic Peptide 1,502.1 (*)    All other components within normal limits  CBC WITH DIFFERENTIAL/PLATELET - Abnormal; Notable for the following components:    RDW 16.6 (*)    Monocytes Absolute 1.2 (*)    All other components within normal limits  PROTIME-INR - Abnormal; Notable for the following components:   Prothrombin Time 16.5 (*)    INR 1.4 (*)    All other components within normal limits  TSH - Abnormal; Notable for the following components:   TSH 4.947 (*)    All other components within normal limits  I-STAT CHEM 8, ED - Abnormal; Notable for the following components:   Chloride 96 (*)    Creatinine, Ser 1.50 (*)    Glucose, Bld 142 (*)    Calcium, Ion 1.08 (*)    All other components within normal limits  TROPONIN I (HIGH SENSITIVITY) - Abnormal; Notable for the following components:   Troponin I (High Sensitivity) 45 (*)    All other components within normal limits  TROPONIN I (HIGH SENSITIVITY) - Abnormal; Notable for the following components:   Troponin I (High Sensitivity) 45 (*)    All other components within normal limits  RESPIRATORY PANEL BY RT PCR (FLU A&B, COVID)  MAGNESIUM  PHOSPHORUS    EKG EKG Interpretation  Date/Time:  Wednesday November 08 2019 14:08:42 EDT Ventricular Rate:  79 PR Interval:    QRS Duration: 131 QT Interval:  405 QTC Calculation: 465 R Axis:   81 Text Interpretation: Sinus rhythm Probable left ventricular hypertrophy Nonspecific T abnormalities, inferior  leads Minimal ST elevation, lateral leads Confirmed by Gilda Crease [phone]) on 11/10/2019 2:05:17 PM   Radiology No results found.  Procedures Procedures (including critical care time) CRITICAL CARE Performed by: Arby Barrette   Total critical care time: 30 minutes  Critical care time was exclusive of separately billable procedures and treating other patients.  Critical care was necessary to treat or prevent imminent or life-threatening deterioration.  Critical care was time spent personally by me on the following activities: development of treatment plan with patient and/or surrogate as well as nursing,  discussions with consultants, evaluation of patient's response to treatment, examination of patient, obtaining history from patient or surrogate, ordering and performing treatments and interventions, ordering and review of laboratory studies, ordering and review of radiographic studies, pulse oximetry and re-evaluation of patient's condition. Medications Ordered in ED Medications - No data to display  ED Course  I have reviewed the triage vital signs and the nursing notes.  Pertinent labs & imaging results that were available during my care of the patient were reviewed by me and considered in my medical decision making (see chart for details).    MDM Rules/Calculators/A&P                         Consult: 13: 00 reviewed with card master for consultation in the emergency department.  Patient presents with wide-complex tachycardia.  He does have an AICD.  He has not had any defibrillator discharge.  He reports symptoms were worse last night.  He was experiencing chest pain and heart rates were ranging up to 150s.  He is compliant with all of his medications.  Patient reports that he still had high heart rate this morning although did not have chest pain.  He was transported by EMS.  EMS administered 150 mg bolus of amiodarone.  Patient's blood pressures have been soft.  He is nontoxic and alert.  Review of systems does not suggest acute illness.  Cardiology has been consulted.  At this time we will continue to monitor for wide-complex tachycardia consistent with stable VT.  Dr. Elberta Fortis to bedside to evaluate patient.  Initially several attempts burst therapy ATP did not convert the patient.  Plans were made to do a direct cardioversion with sedation.  Risk benefits being reviewed with the patient.  At that time Dr. Elberta Fortis tried ramp therapy ATP.  Patient was successfully cardioverted with this treatment modality.  At that time no further necessity for sedation and cardioversion.  Cardiology made  adjustments the patient's amiodarone and follow-up plan with patient being deemed appropriate for discharge. Final Clinical Impression(s) / ED Diagnoses Final diagnoses:  Ventricular tachycardia [facility])    Rx / DC Orders ED Discharge Orders    None       Arby Barrette, MD 11/11/19 1429

## 2019-11-08 NOTE — ED Triage Notes (Signed)
Patient from home with [facility] for evaluation of chest pain that occurred last night. [facility]. EMS reports patient initially in wide-complex tachycardia with BP 80/60, given 150mg  amiodarone PTA, BP improved to 96/86.Patient has implanted [employer]tron[employer] defibrillator, history of 5x MI, and has had defibrillator replaced 3 times, wears 4L O2 [place] at baseline. Patient has no complaints at this time.

## 2019-12-06 ENCOUNTER — Inpatient Hospital Stay (HOSPITAL_COMMUNITY)
Admission: EM | Admit: 2019-12-06 | Discharge: 2019-12-07 | DRG: 309 | Disposition: A | Discharge status: Home / Self Care | Payer: No Typology Code available for payment source | Attending: Cardiology | Admitting: Cardiology

## 2019-12-06 ENCOUNTER — Emergency Department (HOSPITAL_COMMUNITY): Payer: No Typology Code available for payment source

## 2019-12-06 ENCOUNTER — Encounter (HOSPITAL_COMMUNITY): Payer: Self-pay

## 2019-12-06 ENCOUNTER — Other Ambulatory Visit: Payer: Self-pay

## 2019-12-06 DIAGNOSIS — J449 Chronic obstructive pulmonary disease, unspecified: Secondary | ICD-10-CM | POA: Diagnosis present

## 2019-12-06 DIAGNOSIS — I480 Paroxysmal atrial fibrillation: Secondary | ICD-10-CM | POA: Diagnosis present

## 2019-12-06 DIAGNOSIS — Z7982 Long term (current) use of aspirin: Secondary | ICD-10-CM

## 2019-12-06 DIAGNOSIS — Z951 Presence of aortocoronary bypass graft: Secondary | ICD-10-CM

## 2019-12-06 DIAGNOSIS — G4733 Obstructive sleep apnea (adult) (pediatric): Secondary | ICD-10-CM | POA: Diagnosis present

## 2019-12-06 DIAGNOSIS — Z8249 Family history of ischemic heart disease and other diseases of the circulatory system: Secondary | ICD-10-CM

## 2019-12-06 DIAGNOSIS — I255 Ischemic cardiomyopathy: Secondary | ICD-10-CM

## 2019-12-06 DIAGNOSIS — Z87891 Personal history of nicotine dependence: Secondary | ICD-10-CM

## 2019-12-06 DIAGNOSIS — Z955 Presence of coronary angioplasty implant and graft: Secondary | ICD-10-CM

## 2019-12-06 DIAGNOSIS — I2510 Atherosclerotic heart disease of native coronary artery without angina pectoris: Secondary | ICD-10-CM | POA: Diagnosis present

## 2019-12-06 DIAGNOSIS — Z23 Encounter for immunization: Secondary | ICD-10-CM

## 2019-12-06 DIAGNOSIS — E039 Hypothyroidism, unspecified: Secondary | ICD-10-CM | POA: Diagnosis present

## 2019-12-06 DIAGNOSIS — Z20822 Contact with and (suspected) exposure to covid-19: Secondary | ICD-10-CM | POA: Diagnosis present

## 2019-12-06 DIAGNOSIS — Z7951 Long term (current) use of inhaled steroids: Secondary | ICD-10-CM

## 2019-12-06 DIAGNOSIS — Z95810 Presence of automatic (implantable) cardiac defibrillator: Secondary | ICD-10-CM

## 2019-12-06 DIAGNOSIS — I472 Ventricular tachycardia, unspecified: Secondary | ICD-10-CM

## 2019-12-06 DIAGNOSIS — D696 Thrombocytopenia, unspecified: Secondary | ICD-10-CM | POA: Diagnosis present

## 2019-12-06 DIAGNOSIS — K219 Gastro-esophageal reflux disease without esophagitis: Secondary | ICD-10-CM | POA: Diagnosis present

## 2019-12-06 DIAGNOSIS — R55 Syncope and collapse: Secondary | ICD-10-CM | POA: Diagnosis present

## 2019-12-06 DIAGNOSIS — N189 Chronic kidney disease, unspecified: Secondary | ICD-10-CM | POA: Diagnosis present

## 2019-12-06 DIAGNOSIS — I272 Pulmonary hypertension, unspecified: Secondary | ICD-10-CM | POA: Diagnosis present

## 2019-12-06 DIAGNOSIS — Z7901 Long term (current) use of anticoagulants: Secondary | ICD-10-CM

## 2019-12-06 DIAGNOSIS — D649 Anemia, unspecified: Secondary | ICD-10-CM | POA: Diagnosis present

## 2019-12-06 DIAGNOSIS — Z79890 Hormone replacement therapy (postmenopausal): Secondary | ICD-10-CM

## 2019-12-06 DIAGNOSIS — Z9981 Dependence on supplemental oxygen: Secondary | ICD-10-CM

## 2019-12-06 DIAGNOSIS — Z86711 Personal history of pulmonary embolism: Secondary | ICD-10-CM

## 2019-12-06 DIAGNOSIS — Z888 Allergy status to other drugs, medicaments and biological substances status: Secondary | ICD-10-CM

## 2019-12-06 DIAGNOSIS — Z79899 Other long term (current) drug therapy: Secondary | ICD-10-CM

## 2019-12-06 DIAGNOSIS — E8809 Other disorders of plasma-protein metabolism, not elsewhere classified: Secondary | ICD-10-CM | POA: Diagnosis present

## 2019-12-06 DIAGNOSIS — I252 Old myocardial infarction: Secondary | ICD-10-CM

## 2019-12-06 DIAGNOSIS — I420 Dilated cardiomyopathy: Secondary | ICD-10-CM | POA: Diagnosis present

## 2019-12-06 DIAGNOSIS — I5022 Chronic systolic (congestive) heart failure: Secondary | ICD-10-CM

## 2019-12-06 LAB — CBG MONITORING, ED: Glucose-Capillary: 115 mg/dL — ABNORMAL HIGH (ref 70–99)

## 2019-12-06 LAB — CBC
HCT: 39.3 % (ref 39.0–52.0)
Hemoglobin: 12.5 g/dL — ABNORMAL LOW (ref 13.0–17.0)
MCH: 29.4 pg (ref 26.0–34.0)
MCHC: 31.8 g/dL (ref 30.0–36.0)
MCV: 92.5 fL (ref 80.0–100.0)
Platelets: 146 10*3/uL — ABNORMAL LOW (ref 150–400)
RBC: 4.25 MIL/uL (ref 4.22–5.81)
RDW: 15.8 % — ABNORMAL HIGH (ref 11.5–15.5)
WBC: 8.3 10*3/uL (ref 4.0–10.5)
nRBC: 0 % (ref 0.0–0.2)

## 2019-12-06 NOTE — ED Triage Notes (Signed)
Pt BIB [facility] for eval of witnessed fall/LOC from chair after defibrillation event. Pt has hx of afib on elliquis, CHF, COPD, chronically on 3L. Pt A&O4, took one dose of home SL NTG, given 324 ASA by EMS PTA.   VSS, 20g L hand

## 2019-12-07 DIAGNOSIS — Z95810 Presence of automatic (implantable) cardiac defibrillator: Secondary | ICD-10-CM | POA: Diagnosis not present

## 2019-12-07 DIAGNOSIS — R55 Syncope and collapse: Secondary | ICD-10-CM

## 2019-12-07 DIAGNOSIS — Z23 Encounter for immunization: Secondary | ICD-10-CM | POA: Diagnosis not present

## 2019-12-07 DIAGNOSIS — D696 Thrombocytopenia, unspecified: Secondary | ICD-10-CM | POA: Diagnosis present

## 2019-12-07 DIAGNOSIS — Z86711 Personal history of pulmonary embolism: Secondary | ICD-10-CM | POA: Diagnosis not present

## 2019-12-07 DIAGNOSIS — Z955 Presence of coronary angioplasty implant and graft: Secondary | ICD-10-CM | POA: Diagnosis not present

## 2019-12-07 DIAGNOSIS — I472 Ventricular tachycardia, unspecified: Secondary | ICD-10-CM

## 2019-12-07 DIAGNOSIS — Z951 Presence of aortocoronary bypass graft: Secondary | ICD-10-CM | POA: Diagnosis not present

## 2019-12-07 DIAGNOSIS — I252 Old myocardial infarction: Secondary | ICD-10-CM | POA: Diagnosis not present

## 2019-12-07 DIAGNOSIS — E8809 Other disorders of plasma-protein metabolism, not elsewhere classified: Secondary | ICD-10-CM | POA: Diagnosis present

## 2019-12-07 DIAGNOSIS — I255 Ischemic cardiomyopathy: Secondary | ICD-10-CM

## 2019-12-07 DIAGNOSIS — Z7901 Long term (current) use of anticoagulants: Secondary | ICD-10-CM | POA: Diagnosis not present

## 2019-12-07 DIAGNOSIS — Z20822 Contact with and (suspected) exposure to covid-19: Secondary | ICD-10-CM | POA: Diagnosis present

## 2019-12-07 DIAGNOSIS — I5022 Chronic systolic (congestive) heart failure: Secondary | ICD-10-CM | POA: Diagnosis present

## 2019-12-07 DIAGNOSIS — Z8249 Family history of ischemic heart disease and other diseases of the circulatory system: Secondary | ICD-10-CM | POA: Diagnosis not present

## 2019-12-07 DIAGNOSIS — D649 Anemia, unspecified: Secondary | ICD-10-CM | POA: Diagnosis present

## 2019-12-07 DIAGNOSIS — J449 Chronic obstructive pulmonary disease, unspecified: Secondary | ICD-10-CM | POA: Diagnosis present

## 2019-12-07 DIAGNOSIS — K219 Gastro-esophageal reflux disease without esophagitis: Secondary | ICD-10-CM | POA: Diagnosis present

## 2019-12-07 DIAGNOSIS — E039 Hypothyroidism, unspecified: Secondary | ICD-10-CM | POA: Diagnosis present

## 2019-12-07 DIAGNOSIS — G4733 Obstructive sleep apnea (adult) (pediatric): Secondary | ICD-10-CM | POA: Diagnosis present

## 2019-12-07 DIAGNOSIS — I480 Paroxysmal atrial fibrillation: Secondary | ICD-10-CM | POA: Diagnosis present

## 2019-12-07 DIAGNOSIS — R002 Palpitations: Secondary | ICD-10-CM

## 2019-12-07 DIAGNOSIS — I420 Dilated cardiomyopathy: Secondary | ICD-10-CM | POA: Diagnosis present

## 2019-12-07 DIAGNOSIS — I2510 Atherosclerotic heart disease of native coronary artery without angina pectoris: Secondary | ICD-10-CM | POA: Diagnosis present

## 2019-12-07 DIAGNOSIS — I272 Pulmonary hypertension, unspecified: Secondary | ICD-10-CM | POA: Diagnosis present

## 2019-12-07 LAB — COMPREHENSIVE METABOLIC PANEL
ALT: 17 U/L (ref 0–44)
AST: 25 U/L (ref 15–41)
Albumin: 3.2 g/dL — ABNORMAL LOW (ref 3.5–5.0)
Alkaline Phosphatase: 65 U/L (ref 38–126)
Anion gap: 11 (ref 5–15)
BUN: 12 mg/dL (ref 8–23)
CO2: 28 mmol/L (ref 22–32)
Calcium: 9 mg/dL (ref 8.9–10.3)
Chloride: 100 mmol/L (ref 98–111)
Creatinine, Ser: 1.24 mg/dL (ref 0.61–1.24)
GFR, Estimated: >60 mL/min (ref >60)
Glucose, Bld: 126 mg/dL — ABNORMAL HIGH (ref 70–99)
Potassium: 3.5 mmol/L (ref 3.5–5.1)
Sodium: 139 mmol/L (ref 135–145)
Total Bilirubin: 0.6 mg/dL (ref 0.3–1.2)
Total Protein: 5.9 g/dL — ABNORMAL LOW (ref 6.5–8.1)

## 2019-12-07 LAB — BASIC METABOLIC PANEL
Anion gap: 13 (ref 5–15)
BUN: 11 mg/dL (ref 8–23)
CO2: 24 mmol/L (ref 22–32)
Calcium: 8.8 mg/dL — ABNORMAL LOW (ref 8.9–10.3)
Chloride: 101 mmol/L (ref 98–111)
Creatinine, Ser: 1.14 mg/dL (ref 0.61–1.24)
GFR, Estimated: >60 mL/min (ref >60)
Glucose, Bld: 91 mg/dL (ref 70–99)
Potassium: 5.1 mmol/L (ref 3.5–5.1)
Sodium: 138 mmol/L (ref 135–145)

## 2019-12-07 LAB — RESP PANEL BY RT-PCR (FLU A&B, COVID) ARPGX2
Influenza A by PCR: NEGATIVE
Influenza B by PCR: NEGATIVE
SARS Coronavirus 2 by RT PCR: NEGATIVE

## 2019-12-07 LAB — CBC
HCT: 38.8 % — ABNORMAL LOW (ref 39.0–52.0)
Hemoglobin: 12.8 g/dL — ABNORMAL LOW (ref 13.0–17.0)
MCH: 29.9 pg (ref 26.0–34.0)
MCHC: 33 g/dL (ref 30.0–36.0)
MCV: 90.7 fL (ref 80.0–100.0)
Platelets: 181 10*3/uL (ref 150–400)
RBC: 4.28 MIL/uL (ref 4.22–5.81)
RDW: 15.9 % — ABNORMAL HIGH (ref 11.5–15.5)
WBC: 8.2 10*3/uL (ref 4.0–10.5)
nRBC: 0.4 % — ABNORMAL HIGH (ref 0.0–0.2)

## 2019-12-07 LAB — BRAIN NATRIURETIC PEPTIDE: B Natriuretic Peptide: 167.4 pg/mL — ABNORMAL HIGH (ref 0.0–100.0)

## 2019-12-07 LAB — TROPONIN I (HIGH SENSITIVITY)
Troponin I (High Sensitivity): 16 ng/L (ref <18)
Troponin I (High Sensitivity): 17 ng/L (ref <18)

## 2019-12-07 LAB — MAGNESIUM: Magnesium: 2.1 mg/dL (ref 1.7–2.4)

## 2019-12-07 LAB — TSH: TSH: 2.163 u[IU]/mL (ref 0.350–4.500)

## 2019-12-07 MED ORDER — OMEGA-3-ACID ETHYL ESTERS 1 G PO CAPS
1.0000 g | ORAL_CAPSULE | Freq: Two times a day (BID) | ORAL | Status: DC
  Administered 2019-12-07: 1 g via ORAL
  Filled 2019-12-07: qty 1

## 2019-12-07 MED ORDER — MEXILETINE HCL 150 MG PO CAPS: 300.0000 mg | ORAL_CAPSULE | Freq: Two times a day (BID) | ORAL | 2 refills | Status: DC

## 2019-12-07 MED ORDER — METOPROLOL SUCCINATE ER 25 MG PO TB24
25.0000 mg | ORAL_TABLET | Freq: Every day | ORAL | Status: DC
  Filled 2019-12-07: qty 1

## 2019-12-07 MED ORDER — POTASSIUM CHLORIDE ER 10 MEQ PO TBCR
20.0000 meq | EXTENDED_RELEASE_TABLET | Freq: Every day | ORAL | Status: DC
  Filled 2019-12-07 (×2): qty 2

## 2019-12-07 MED ORDER — AMIODARONE HCL 200 MG PO TABS
400.0000 mg | ORAL_TABLET | Freq: Three times a day (TID) | ORAL | Status: DC
  Administered 2019-12-07: 400 mg via ORAL
  Filled 2019-12-07: qty 2

## 2019-12-07 MED ORDER — NITROGLYCERIN 0.4 MG SL SUBL: 0.4000 mg | SUBLINGUAL_TABLET | SUBLINGUAL | Status: DC | PRN

## 2019-12-07 MED ORDER — ACETAMINOPHEN 325 MG PO TABS: 650.0000 mg | ORAL_TABLET | Freq: Four times a day (QID) | ORAL | Status: DC | PRN

## 2019-12-07 MED ORDER — LEVOTHYROXINE SODIUM 75 MCG PO TABS: 75.0000 ug | ORAL_TABLET | Freq: Every day | ORAL | Status: DC

## 2019-12-07 MED ORDER — PANTOPRAZOLE SODIUM 40 MG PO TBEC
40.0000 mg | DELAYED_RELEASE_TABLET | Freq: Every day | ORAL | Status: DC
  Administered 2019-12-07: 40 mg via ORAL
  Filled 2019-12-07: qty 1

## 2019-12-07 MED ORDER — VITAMIN D 25 MCG (1000 UNIT) PO TABS
2000.0000 [IU] | ORAL_TABLET | Freq: Every day | ORAL | Status: DC
  Administered 2019-12-07: 2000 [IU] via ORAL
  Filled 2019-12-07: qty 2

## 2019-12-07 MED ORDER — POLYVINYL ALCOHOL 1.4 % OP SOLN
1.0000 [drp] | OPHTHALMIC | Status: DC
  Administered 2019-12-07: 1 [drp] via OPHTHALMIC
  Filled 2019-12-07: qty 15

## 2019-12-07 MED ORDER — AMIODARONE HCL 400 MG PO TABS: 400.0000 mg | ORAL_TABLET | Freq: Three times a day (TID) | ORAL | 2 refills | Status: DC

## 2019-12-07 MED ORDER — POTASSIUM CHLORIDE ER 10 MEQ PO TBCR
40.0000 meq | EXTENDED_RELEASE_TABLET | Freq: Once | ORAL | Status: AC
  Administered 2019-12-07: 40 meq via ORAL
  Filled 2019-12-07: qty 4

## 2019-12-07 MED ORDER — FUROSEMIDE 40 MG PO TABS
40.0000 mg | ORAL_TABLET | Freq: Two times a day (BID) | ORAL | Status: DC
  Administered 2019-12-07: 40 mg via ORAL
  Filled 2019-12-07: qty 1

## 2019-12-07 MED ORDER — MEXILETINE HCL 150 MG PO CAPS
300.0000 mg | ORAL_CAPSULE | Freq: Two times a day (BID) | ORAL | Status: DC
  Administered 2019-12-07: 300 mg via ORAL
  Filled 2019-12-07: qty 2

## 2019-12-07 MED ORDER — LEVOTHYROXINE SODIUM 75 MCG PO TABS
75.0000 ug | ORAL_TABLET | Freq: Every day | ORAL | Status: DC
  Administered 2019-12-07: 75 ug via ORAL
  Filled 2019-12-07: qty 1

## 2019-12-07 MED ORDER — ASPIRIN 81 MG PO CHEW
81.0000 mg | CHEWABLE_TABLET | Freq: Every day | ORAL | Status: DC
  Administered 2019-12-07: 81 mg via ORAL
  Filled 2019-12-07: qty 1

## 2019-12-07 MED ORDER — RANOLAZINE ER 500 MG PO TB12: 500.0000 mg | ORAL_TABLET | Freq: Two times a day (BID) | ORAL | 2 refills | Status: DC

## 2019-12-07 MED ORDER — FLUTICASONE PROPIONATE HFA 44 MCG/ACT IN AERO
2.0000 | INHALATION_SPRAY | Freq: Two times a day (BID) | RESPIRATORY_TRACT | Status: DC
  Administered 2019-12-07: 2 via RESPIRATORY_TRACT
  Filled 2019-12-07: qty 10.6

## 2019-12-07 MED ORDER — APIXABAN 5 MG PO TABS: 5.0000 mg | ORAL_TABLET | Freq: Two times a day (BID) | ORAL | Status: DC

## 2019-12-07 MED ORDER — MONTELUKAST SODIUM 10 MG PO TABS
10.0000 mg | ORAL_TABLET | Freq: Every day | ORAL | Status: DC
  Administered 2019-12-07: 10 mg via ORAL
  Filled 2019-12-07: qty 1

## 2019-12-07 MED ORDER — POLYVINYL ALCOHOL 1.4 % OP SOLN
1.0000 [drp] | Freq: Every day | OPHTHALMIC | Status: DC
  Filled 2019-12-07: qty 15

## 2019-12-07 MED ORDER — ADULT MULTIVITAMIN W/MINERALS CH
1.0000 | ORAL_TABLET | Freq: Every day | ORAL | Status: DC
  Administered 2019-12-07: 1 via ORAL
  Filled 2019-12-07: qty 1

## 2019-12-07 MED ORDER — INFLUENZA VAC A&B SA ADJ QUAD 0.5 ML IM PRSY: 0.5000 mL | PREFILLED_SYRINGE | INTRAMUSCULAR | Status: DC

## 2019-12-07 MED ORDER — RANOLAZINE ER 500 MG PO TB12
500.0000 mg | ORAL_TABLET | Freq: Two times a day (BID) | ORAL | Status: DC
  Administered 2019-12-07: 500 mg via ORAL
  Filled 2019-12-07: qty 1

## 2019-12-07 MED ORDER — GABAPENTIN 100 MG PO CAPS
200.0000 mg | ORAL_CAPSULE | Freq: Two times a day (BID) | ORAL | Status: DC
  Administered 2019-12-07: 200 mg via ORAL
  Filled 2019-12-07: qty 2

## 2019-12-07 MED ORDER — ATORVASTATIN CALCIUM 80 MG PO TABS: 80.0000 mg | ORAL_TABLET | Freq: Every evening | ORAL | Status: DC

## 2019-12-07 MED ORDER — LEVALBUTEROL TARTRATE 45 MCG/ACT IN AERO
2.0000 | INHALATION_SPRAY | Freq: Four times a day (QID) | RESPIRATORY_TRACT | Status: DC | PRN
  Filled 2019-12-07: qty 15

## 2019-12-07 NOTE — Consult Note (Signed)
Cardiology Consultation:   Patient ID: [name] MRN: [id number]; DOB: May 12, 1950  Admit date: 12/06/2019 Date of Consult: 12/07/2019  Primary Care Provider: Anson Fret, MD [facility] Cardiologist: Lesleigh Noe, MD  [facility] Electrophysiologist:  Lewayne Bunting, MD    Patient Profile:   Max Rivas is a 69 y.o. male with a hx of ischemic cardiomyopathy and VT who is being seen today for the evaluation of VT at the request of Max Rivas.  History of Present Illness:   Max Rivas is a 69 year old male with a history of gold for COPD on home oxygen, coronary disease status post CABG in May 2021 with left atrial appendage clipping, ischemic cardiomyopathy, and ventricular tachycardia.  He is currently on both amiodarone and mexiletine for his VT.  He was doing well without complaints when he suddenly started feeling palpitations and quickly passed out.  He had his head with ICD shocks.  Is remained witnessed the episode.  He states that he was coughing quite a bit prior to his episode of syncope when he then passed out.  When he woke up, he felt well without major complaints.  He does have a longstanding history of VT and is status post VT ablation 09/26/2019.  Unfortunately during that procedure multiple VT's were induced and he had modification of an anterior scar.  This morning he feels well.  He has continued to be out of ventricular tachycardia since his initial episode.   Past Medical History:  Diagnosis Date  . Arthritis    "elbows, knees" (02/24/2016)  . Bronchitis 2006  . CAD (coronary artery disease)    a. s/p prior MIs - 1995 x 2, 1998; b. s/p prior LCX stenting; c. 04/2019 Cath: LM min irregs, LAD 65p/mi, D1 75, LCX 99ost/p, 62m (ISR), OM2 80, RCA/RPDA mod diff dzs; d. 05/2019 CABG x 3: LIMA->LAD, VG->Diag, VG->OM.  Marland Kitchen COPD (chronic obstructive pulmonary disease) (HCC)    a. Remote tobacco-->on home O2.  Marland Kitchen GERD (gastroesophageal reflux disease)    . 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 05/2019 Echo: EF 40-45%, gr2 DD. Nl RV size/fxn. Mildly dil LA. Mild MR.  . High cholesterol   . Ischemic cardiomyopathy    a. 05/2019 Echo: EF 40-45%.  . Morbid obesity (HCC)   . Myocardial infarction [name]) ~ 1995 X 2;1998; 2000; 2004  . On home oxygen therapy    "2L w/activity" (02/24/2016)  . PAF (paroxysmal atrial fibrillation) (HCC)    a. CHA2DS2VASc = 4-->eliquis. Also on amio.  . Pulmonary embolism (HCC) 02/24/2016  . Sleep apnea    "have CPAP; can't tolerate it" (02/24/2016)  . Ventricular tachycardia (HCC)    a. 2005 s/p ICD; b. 03/2011 Device upgrade/lead exchange: MDT Protecta XT VR single lead AICD.    Past Surgical History:  Procedure Laterality Date  . CARDIOVERSION N/A 09/11/2019   Procedure: CARDIOVERSION;  Surgeon: Jodelle Red, MD;  Location: [facility] ENDOSCOPY;  Service: Cardiovascular;  Laterality: N/A;  . CLIPPING OF ATRIAL APPENDAGE N/A 05/23/2019   Procedure: CLIPPING OF ATRIAL APPENDAGE with atriclip;  Surgeon: [name], MD;  Location: [facility] OR;  Service: Open Heart Surgery;  Laterality: N/A;  . CORONARY ANGIOPLASTY  1995  . CORONARY ANGIOPLASTY WITH STENT PLACEMENT  ~ 1995 - 2004 X 5   "I've got a total of 5 stents" (02/24/2016)  . CORONARY ARTERY BYPASS GRAFT N/A 05/23/2019   Procedure: CORONARY ARTERY BYPASS GRAFTING (CABG), times three, using  right greater saphenous vein and left internal mammary;  Surgeon: [name], MD;  Location: [facility] OR;  Service: Open Heart Surgery;  Laterality: N/A;  [place] only  . 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 INTRAVASCULAR PRESSURE WIRE/FFR STUDY N/A 05/12/2019   Procedure: INTRAVASCULAR PRESSURE WIRE/FFR STUDY;  Surgeon: Lyn Records, MD;  Location: [facility] INVASIVE CV LAB;  Service: Cardiovascular;  Laterality: N/A;  . LEFT HEART CATH AND CORONARY ANGIOGRAPHY N/A 05/12/2019   Procedure: LEFT  HEART CATH AND CORONARY ANGIOGRAPHY;  Surgeon: Lyn Records, MD;  Location: [facility] INVASIVE CV LAB;  Service: Cardiovascular;  Laterality: N/A;  . PACEMAKER GENERATOR CHANGE  03/2011    [facility] [place]  . TEE WITHOUT CARDIOVERSION N/A 05/23/2019   Procedure: TRANSESOPHAGEAL ECHOCARDIOGRAM (TEE);  Surgeon: [name], MD;  Location[facility];  Service: Open Heart Surgery;  Laterality: N/A;  . TONSILLECTOMY       Home Medications:  Prior to Admission medications   Medication Sig Start Date End Date Taking? Authorizing Provider  acetaminophen (TYLENOL) 325 MG tablet Take 2 tablets (650 mg total) by mouth every 6 (six) hours as needed for mild pain (or Fever >/= 101). 03/05/16  Yes Alison Murray, MD  apixaban (ELIQUIS) 5 MG TABS tablet Take 5 mg by mouth 2 (two) times daily.   Yes [provider]  aspirin 81 MG chewable tablet Chew 1 tablet (81 mg total) by mouth daily. 05/16/19  Yes Kroeger, Dot Lanes M., PA-C  atorvastatin (LIPITOR) 80 MG tablet Take 1 tablet (80 mg total) by mouth daily. Patient taking differently: Take 80 mg by mouth every evening.  05/15/19  Yes Kroeger, Dot Lanes M., PA-C  carboxymethylcellulose (REFRESH PLUS) 0.5 % SOLN Place 1 drop into both eyes at bedtime.   Yes [provider]  Cholecalciferol (VITAMIN D3) 50 MCG (2000 UT) TABS Take 2,000 Units by mouth daily.   Yes [provider]  furosemide (LASIX) 40 MG tablet Take 40 mg by mouth 2 (two) times daily.    Yes [provider]  gabapentin (NEURONTIN) 100 MG capsule Take 2 capsules (200 mg total) by mouth 2 (two) times daily. 06/01/19  Yes Barrett, Erin R, PA-C  levalbuterol (XOPENEX HFA) 45 MCG/ACT inhaler Inhale 2 puffs into the lungs every 6 (six) hours as needed for wheezing or shortness of breath.   Yes [provider]  levothyroxine (SYNTHROID) 75 MCG tablet Take 75 mcg by mouth daily before breakfast.   Yes [provider]  metoprolol succinate (TOPROL-XL) 25 MG 24 hr tablet Take  12.5 mg by mouth daily.   Yes [provider]  mexiletine (MEXITIL) 150 MG capsule Take 150 mg by mouth 3 (three) times daily.    Yes [provider]  mometasone (ASMANEX) 220 MCG/INH inhaler Inhale 2 puffs into the lungs 2 (two) times daily.    Yes [provider]  montelukast (SINGULAIR) 10 MG tablet Take 10 mg by mouth daily.    Yes [provider]  Multiple Vitamin (MULTIVITAMIN WITH MINERALS) TABS tablet Take 1 tablet by mouth daily.   Yes [provider]  nitroGLYCERIN (NITROSTAT) 0.4 MG SL tablet Place 0.4 mg under the tongue every 5 (five) minutes as needed for chest pain.    Yes [provider]  omega-3 acid ethyl esters (LOVAZA) 1 g capsule Take 1 capsule (1 g total) by mouth 2 (two) times daily. 03/05/16  Yes Alison Murray, MD  omeprazole (PRILOSEC) 20 MG capsule Take 20 mg by mouth 2 (two) times daily before a meal.   Yes [provider]  Propylene Glycol (SYSTANE BALANCE) 0.6 % SOLN Place 1 drop into both eyes See admin instructions. Place 1 drop into each eye four times a day and apply a warm compress for 10 minutes afterwards    Yes [provider]  Tiotropium Bromide-Olodaterol (STIOLTO RESPIMAT) 2.5-2.5 MCG/ACT AERS Inhale 2 puffs into the lungs daily.    Yes [provider]    Inpatient Medications: Scheduled Meds: . amiodarone  400 mg Oral TID  . aspirin  81 mg Oral Daily  . atorvastatin  80 mg Oral QPM  . cholecalciferol  2,000 Units Oral Daily  . fluticasone  2 puff Inhalation BID  . furosemide  40 mg Oral BID  . gabapentin  200 mg Oral BID  . [START ON 12/08/2019] influenza vaccine adjuvanted  0.5 mL Intramuscular Tomorrow-1000  . levothyroxine  75 mcg Oral Q0600  . metoprolol succinate  25 mg Oral Daily  . mexiletine  300 mg Oral BID  . montelukast  10 mg Oral Daily  . multivitamin with minerals  1 tablet Oral Daily  . omega-3 acid ethyl esters  1 g Oral BID  . pantoprazole  40 mg Oral  Daily  . polyvinyl alcohol  1 drop Both Eyes QHS  . polyvinyl alcohol  1 drop Both Eyes 4 times per day  . potassium chloride  20 mEq Oral Daily   Continuous Infusions:  PRN Meds: acetaminophen, levalbuterol, nitroGLYCERIN  Allergies:    Allergies  Allergen Reactions  . Crestor [Rosuvastatin Calcium] Other (See Comments)    Back spasms, but can tolerate it at a low dose now    Social History:   Social History   Socioeconomic History  . Marital status: Divorced    Spouse name: Not on file  . Number of children: Not on file  . Years of education: Not on file  . Highest education level: Not on file  Occupational History  . Not on file  Tobacco Use  . Smoking status: Former Smoker    Packs/day: 3.00    Years: 35.00    Pack years: 105.00    Types: Cigarettes    Quit date: 05/22/2019    Years since quitting: 0.5  . Smokeless tobacco: Former Neurosurgeon    Quit date: 05/21/2017  Vaping Use  . Vaping Use: Never used  Substance and Sexual Activity  . Alcohol use: Not Currently  . Drug use: Not Currently    Types: Cocaine    Comment: none since 1995  . Sexual activity: Yes  Other Topics Concern  . Not on file  Social History Narrative  . Not on file   Social Determinants of Health   Financial Resource Strain:   . Difficulty of Paying Living Expenses: Not on file  Food Insecurity:   . Worried About Programme researcher, broadcasting/film/video in the Last Year: Not on file  . Ran Out of Food in the Last Year: Not on file  Transportation Needs:   . Lack of Transportation (Medical): Not on file  . Lack of Transportation (Non-Medical): Not on file  Physical Activity:   . Days of Exercise per Week: Not on file  . Minutes of Exercise per Session: Not on file  Stress:   . Feeling of Stress : Not on file  Social Connections:   . Frequency of Communication  with Friends and Family: Not on file  . Frequency of Social Gatherings with Friends and Family: Not on file  . Attends Religious Services: Not on  file  . Active Member of Clubs or Organizations: Not on file  . Attends Banker Meetings: Not on file  . Marital Status: Not on file  Intimate Partner Violence:   . Fear of Current or Ex-Partner: Not on file  . Emotionally Abused: Not on file  . Physically Abused: Not on file  . Sexually Abused: Not on file    Family History:    Family History  Problem Relation Age of Onset  . Heart attack Mother   . Heart attack Father   . Heart attack Sister 82     ROS:  Please see the history of present illness.   All other ROS reviewed and negative.     Physical Exam/Data:   Vitals:   12/07/19 0015 12/07/19 0115 12/07/19 0417 12/07/19 0450  BP: (!) 102/56 (!) 118/56 103/76 109/60  Pulse: 64 64 60 62  Resp: 13 13 16 20   Temp:    97.6 F (36.4 C)  TempSrc:    Oral  SpO2: 98% 97% 100% 95%  Weight:    92.6 kg  Height:        Intake/Output Summary (Last 24 hours) at 12/07/2019 0806 Last data filed at 12/07/2019 0511 Gross per 24 hour  Intake 240 ml  Output --  Net 240 ml   Last 3 Weights 12/07/2019 12/06/2019 09/23/2019  Weight (lbs) 204 lb 2.3 oz 202 lb 213 lb 9.6 oz  Weight (kg) 92.6 kg 91.627 kg 96.888 kg     Body mass index is 28.47 kg/m.  General:  Well nourished, well developed, in no acute distress HEENT: normal Lymph: no adenopathy Neck: no JVD Endocrine:  No thryomegaly Vascular: No carotid bruits; FA pulses 2+ bilaterally without bruits  Cardiac:  normal S1, S2; RRR; no murmur  Lungs:  clear to auscultation bilaterally, no wheezing, rhonchi or rales  Abd: soft, nontender, no hepatomegaly  Ext: no edema Musculoskeletal:  No deformities, BUE and BLE strength normal and equal Skin: warm and dry  Neuro:  CNs 2-12 intact, no focal abnormalities noted Psych:  Normal affect   EKG:  The EKG was personally reviewed and demonstrates: Sinus rhythm, rate 68, PACs Telemetry:  Telemetry was personally reviewed and demonstrates: Atrial fibrillation  Relevant  CV Studies: TTE 09/09/19 1. Left ventricular ejection fraction, by estimation, is 30 to 35%. The  left ventricle has moderately decreased function. The left ventricle  demonstrates global hypokinesis. The left ventricular internal cavity size  was moderately dilated. Left  ventricular diastolic parameters are consistent with Grade I diastolic  dysfunction (impaired relaxation).  2. Right ventricular systolic function is moderately reduced. The right  ventricular size is mildly enlarged. There is moderately elevated  pulmonary artery systolic pressure.  3. Left atrial size was severely dilated.  4. Right atrial size was mildly dilated.  5. The mitral valve is normal in structure. Mild mitral valve  regurgitation. No evidence of mitral stenosis.  6. The aortic valve is normal in structure. Aortic valve regurgitation is  not visualized. No aortic stenosis is present.  7. The inferior vena cava is dilated in size with <50% respiratory  variability, suggesting right atrial pressure of 15 mmHg.  Laboratory Data:  High Sensitivity Troponin:   Recent Labs  Lab 11/08/19 1320 11/08/19 1450 12/06/19 2219 12/07/19 0130  TROPONINIHS 45* 45* 16  17     Chemistry Recent Labs  Lab 12/06/19 2219 12/07/19 0453  NA 139 138  K 3.5 5.1  CL 100 101  CO2 28 24  GLUCOSE 126* 91  BUN 12 11  CREATININE 1.24 1.14  CALCIUM 9.0 8.8*  GFRNONAA >60 >60  ANIONGAP 11 13    Recent Labs  Lab 12/06/19 2219  PROT 5.9*  ALBUMIN 3.2*  AST 25  ALT 17  ALKPHOS 65  BILITOT 0.6   Hematology Recent Labs  Lab 12/06/19 2219 12/07/19 0453  WBC 8.3 8.2  RBC 4.25 4.28  HGB 12.5* 12.8*  HCT 39.3 38.8*  MCV 92.5 90.7  MCH 29.4 29.9  MCHC 31.8 33.0  RDW 15.8* 15.9*  PLT 146* 181   BNP Recent Labs  Lab 12/07/19 0453  BNP 167.4*    DDimer No results for input(s): DDIMER in the last 168 hours.   Radiology/Studies:  CT HEAD WO CONTRAST  Result Date: 12/06/2019 CLINICAL DATA:   Witnessed fall on Eliquis EXAM: CT HEAD WITHOUT CONTRAST TECHNIQUE: Contiguous axial images were obtained from the base of the skull through the vertex without intravenous contrast. COMPARISON:  None. FINDINGS: Brain: No acute territorial infarction, hemorrhage or intracranial mass. Small chronic right posterior parietal infarct. Minimal hypodensity in the white matter. Nonenlarged ventricles. Vascular: No hyperdense vessels.  Carotid vascular calcification Skull: Normal. Negative for fracture or focal lesion. Sinuses/Orbits: No acute finding. Other: None IMPRESSION: 1. No CT evidence for acute intracranial abnormality. 2. Small chronic right posterior parietal infarct. 3. Minimal small vessel ischemic changes of the white matter. Electronically Signed   By: Jasmine Pang M.D.   On: 12/06/2019 22:57   DG Chest Port 1 View  Result Date: 12/06/2019 CLINICAL DATA:  69 year old male with chest pain. EXAM: PORTABLE CHEST 1 VIEW COMPARISON:  Chest radiograph dated 11/08/2019. FINDINGS: There is chronic interstitial coarsening and scarring of the lung bases as well as peripheral aspect of the right mid lung field. Patchy area of streaky density in the right mid and right lower lung field may be chronic and represent scarring or infiltrate. Clinical correlation is recommended. Blunting of the right costophrenic angle, likely chronic and scarring. No pneumothorax. Stable cardiomegaly. Median sternotomy wires and CABG vascular clips and left AICD device. No acute osseous pathology. IMPRESSION: Chronic changes as described. Right mid and lower lung field scarring or infiltrate. Electronically Signed   By: Elgie Collard M.D.   On: 12/06/2019 22:40     Assessment and Plan:   1. Ventricular tachycardia: Received ATP, which sped up his tachycardia resulting in ICD shock.  He is currently on both amiodarone and mexiletine.  We Purnell Daigle increase his amiodarone to 400 mg 3 times daily and mexiletine to 300 mg twice daily.   We Sela Falk also add Ranexa 500 mg twice daily.  His troponin is 17 and is not endorsing chest pain.  I do not feel this is ischemia driven.  This is likely scar driven from his ischemic cardiomyopathy.  He does not need an ischemic work-up at this time.  He Elienai Gailey be in touch with his primary cardiologist at discharge and wishes to follow-up with them. 2. Ischemic cardiomyopathy: There is no obvious volume overload.  We Any Mcneice continue with his current heart failure meds.     For questions or updates, please contact [facility] Please consult [email or web address] for contact info under    Signed, Alayia Meggison Jorja Loa, MD  12/07/2019 8:06 AM

## 2019-12-07 NOTE — Discharge Summary (Addendum)
Discharge Summary    Patient ID: Max Rivas MRN: [id number]; DOB: August 17, 1950  Admit date: 12/06/2019 Discharge date: 12/07/2019  Primary Care Provider: Anson Fret, MD  Primary Cardiologist: Lesleigh Noe, MD  Primary Electrophysiologist:  Lewayne Bunting, MD   Discharge Diagnoses    Active Problems:   DCM (dilated cardiomyopathy) [facility])   Coronary artery disease   COPD (chronic obstructive pulmonary disease) (HCC)   CKD (chronic kidney disease)   ICD (implantable cardioverter-defibrillator) in place   Chronic HFrEF (heart failure with reduced ejection fraction) (HCC)   Ventricular tachycardia (HCC)   Ventricular tachyarrhythmia (HCC)   Cardiomyopathy, ischemic  Diagnostic Studies/Procedures    None  History of Present Illness     Max Rivas is a 69 y.o. male with a hx of COPD on home oxygen, coronary disease status post CABG in 05/2019 with left atrial appendage clipping, ischemic cardiomyopathy, and ventricular tachycardia.  He is currently on both amiodarone and mexiletine for his VT who was seen 12/07/19 for the evaluation of VT/syncope with ICD shock at the request of Max Rivas.   He was doing well without complaints when he suddenly started feeling palpitations with subsequent syncope and an ICD shock. He stated he was coughing quite a bit prior to his episode of syncope when he then passed out. When he woke up, he felt well without major complaints.  He does have a longstanding history of VT and is status post VT ablation 09/26/2019. Unfortunately during that procedure multiple VT's were induced and he had modification of an anterior scar.   Hospital Course    He was then seen by Dr. Elberta Fortis 12/07/19 at which time plan was to increase his amiodarone to 400 mg 3 times daily and mexiletine to 300 mg twice daily. Ranexa 500 mg twice daily was also added to his regimen.  His HsT was found to be 17 with no c/o of recent chest pain. EP felt his episode was  not ischemia driven and was likely scar driven from his ischemic cardiomyopathy.  He does not need an ischemic work-up at this time. We Iver Miklas arrange for EP follow up along with primary cardiology follow up on hospital discharge today.   Hospital issues include:  -VT with ICD shock: Seen by Dr. Elberta Fortis with plan to increase Amiodarone to 400mg  TID and Mexiletine to 300mg  BID. Ranexxa was added to his regimen as well. We Maximino Cozzolino arrange EP anf general cardiology follow up by sending a message to scheduling team.   -Ischemic cardiomyopathy: There is no obvious volume overload.  We Elisabetta Mishra continue with his current heart failure meds.    Consultants: None  The patient was seen and examined by Dr. who feels he is stable and ready for discharge today, 12/07/2019.  We Anacarolina Evelyn send a staff message for EP and general cardiology follow-up as offices are closed today for the holiday.  Did the patient have an acute coronary syndrome (MI, NSTEMI, STEMI, etc) this admission?:  No                               Did the patient have a percutaneous coronary intervention (stent / angioplasty)?:  No.     Discharge Vitals Blood pressure (!) 98/57, pulse 66, temperature 97.6 F (36.4 C), temperature source Oral, resp. rate 18, height 5\' 11"  (1.803 m), weight 92.6 kg, SpO2 100 %.  Filed Weights   12/06/19 2155 12/07/19 0450  Weight: 91.6 kg 92.6 kg   Labs & Radiologic Studies    CBC Recent Labs    12/06/19 2219 12/07/19 0453  WBC 8.3 8.2  HGB 12.5* 12.8*  HCT 39.3 38.8*  MCV 92.5 90.7  PLT 146* 181   Basic Metabolic Panel Recent Labs    02/72/53 2219 12/07/19 0453  NA 139 138  K 3.5 5.1  CL 100 101  CO2 28 24  GLUCOSE 126* 91  BUN 12 11  CREATININE 1.24 1.14  CALCIUM 9.0 8.8*  MG 2.1  --    Liver Function Tests Recent Labs    12/06/19 2219  AST 25  ALT 17  ALKPHOS 65  BILITOT 0.6  PROT 5.9*  ALBUMIN 3.2*   No results for input(s): LIPASE, AMYLASE in the last 72 hours. High  Sensitivity Troponin:   Recent Labs  Lab 11/08/19 1320 11/08/19 1450 12/06/19 2219 12/07/19 0130  TROPONINIHS 45* 45* 16 17    BNP Invalid input(s): POCBNP D-Dimer No results for input(s): DDIMER in the last 72 hours. Hemoglobin A1C No results for input(s): HGBA1C in the last 72 hours. Fasting Lipid Panel No results for input(s): CHOL, HDL, LDLCALC, TRIG, CHOLHDL, LDLDIRECT in the last 72 hours. Thyroid Function Tests Recent Labs    12/06/19 2343  TSH 2.163   _____________  CT HEAD WO CONTRAST  Result Date: 12/06/2019 CLINICAL DATA:  Witnessed fall on Eliquis EXAM: CT HEAD WITHOUT CONTRAST TECHNIQUE: Contiguous axial images were obtained from the base of the skull through the vertex without intravenous contrast. COMPARISON:  None. FINDINGS: Brain: No acute territorial infarction, hemorrhage or intracranial mass. Small chronic right posterior parietal infarct. Minimal hypodensity in the white matter. Nonenlarged ventricles. Vascular: No hyperdense vessels.  Carotid vascular calcification Skull: Normal. Negative for fracture or focal lesion. Sinuses/Orbits: No acute finding. Other: None IMPRESSION: 1. No CT evidence for acute intracranial abnormality. 2. Small chronic right posterior parietal infarct. 3. Minimal small vessel ischemic changes of the white matter. Electronically Signed   By: Jasmine Pang M.D.   On: 12/06/2019 22:57   DG Chest Port 1 View  Result Date: 12/06/2019 CLINICAL DATA:  69 year old male with chest pain. EXAM: PORTABLE CHEST 1 VIEW COMPARISON:  Chest radiograph dated 11/08/2019. FINDINGS: There is chronic interstitial coarsening and scarring of the lung bases as well as peripheral aspect of the right mid lung field. Patchy area of streaky density in the right mid and right lower lung field may be chronic and represent scarring or infiltrate. Clinical correlation is recommended. Blunting of the right costophrenic angle, likely chronic and scarring. No pneumothorax.  Stable cardiomegaly. Median sternotomy wires and CABG vascular clips and left AICD device. No acute osseous pathology. IMPRESSION: Chronic changes as described. Right mid and lower lung field scarring or infiltrate. Electronically Signed   By: Elgie Collard M.D.   On: 12/06/2019 22:40   DG Chest Port 1 View  Result Date: 11/08/2019 CLINICAL DATA:  LEFT side chest pain and tachycardia for 2 days, history coronary artery disease post MI and cardiac surgery, COPD, GERD, former smoker EXAM: PORTABLE CHEST 1 VIEW COMPARISON:  Portable exam 1306 hours compared to 09/21/2019 FINDINGS: LEFT subclavian ICD with leads projecting over RIGHT ventricle. Upper normal size of cardiac silhouette post median sternotomy and LEFT atrial appendage clipping. Atherosclerotic calcification aorta. RIGHT fibrothorax changes again identified. Persistent infiltrate in mid RIGHT lung. Chronic interstitial disease changes at both lung bases. No definite acute infiltrate, pleural effusion or pneumothorax. 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 Persistent RIGHT mid lung infiltrate. Post cardiac surgery changes and ICD. No acute abnormalities. Electronically Signed   By: Ulyses Southward M.D.   On: 11/08/2019 13:33   Disposition   Pt is being discharged home today in good condition.  Follow-up Plans & Appointments    Follow-up Information     [facility] Follow up.   Specialty: Cardiology Why: Our office Trevan Messman call with date and time of EP and general cardiology follow-up Contact information: [address], [address] [place] [address] [phone]               Discharge Instructions     Call MD for:  difficulty breathing, headache or visual disturbances   Complete by: As directed    Call MD for:  extreme fatigue   Complete by: As directed    Call MD for:  hives   Complete by: As directed    Call MD for:  persistant dizziness or  light-headedness   Complete by: As directed    Call MD for:  persistant nausea and vomiting   Complete by: As directed    Call MD for:  redness, tenderness, or signs of infection (pain, swelling, redness, odor or green/yellow discharge around incision site)   Complete by: As directed    Call MD for:  severe uncontrolled pain   Complete by: As directed    Call MD for:  temperature >100.4   Complete by: As directed    Diet - low sodium heart healthy   Complete by: As directed    Increase activity slowly   Complete by: As directed        Discharge Medications   Allergies as of 12/07/2019       Reactions   Crestor [rosuvastatin Calcium] Other (See Comments)   Back spasms, but can tolerate it at a low dose now        Medication List     TAKE these medications    acetaminophen 325 MG tablet Commonly known as: TYLENOL Take 2 tablets (650 mg total) by mouth every 6 (six) hours as needed for mild pain (or Fever >/= 101).   amiodarone 400 MG tablet Commonly known as: PACERONE Take 1 tablet (400 mg total) by mouth 3 (three) times daily.   aspirin 81 MG chewable tablet Chew 1 tablet (81 mg total) by mouth daily.   atorvastatin 80 MG tablet Commonly known as: LIPITOR Take 1 tablet (80 mg total) by mouth daily. What changed: when to take this   carboxymethylcellulose 0.5 % Soln Commonly known as: REFRESH PLUS Place 1 drop into both eyes at bedtime.   Eliquis 5 MG Tabs tablet Generic drug: apixaban Take 5 mg by mouth 2 (two) times daily.   furosemide 40 MG tablet Commonly known as: LASIX Take 40 mg by mouth 2 (two) times daily.   gabapentin 100 MG capsule Commonly known as: NEURONTIN Take 2 capsules (200 mg total) by mouth 2 (two) times daily.   levothyroxine 75 MCG tablet Commonly known as: SYNTHROID Take 75 mcg by mouth daily before breakfast.   metoprolol succinate 25 MG 24 hr tablet Commonly known as: TOPROL-XL Take 12.5 mg by mouth daily.   mexiletine  150 MG capsule Commonly known as: MEXITIL Take 2 capsules (300 mg total) by mouth 2 (two) times daily. What changed:  how much to take when to take this   mometasone 220 MCG/INH inhaler Commonly known as: [facility]  Inhale 2 puffs into the lungs 2 (two) times daily.   montelukast 10 MG tablet Commonly known as: SINGULAIR Take 10 mg by mouth daily.   multivitamin with minerals Tabs tablet Take 1 tablet by mouth daily.   nitroGLYCERIN 0.4 MG SL tablet Commonly known as: NITROSTAT Place 0.4 mg under the tongue every 5 (five) minutes as needed for chest pain.   omega-3 acid ethyl esters 1 g capsule Commonly known as: LOVAZA Take 1 capsule (1 g total) by mouth 2 (two) times daily.   omeprazole 20 MG capsule Commonly known as: PRILOSEC Take 20 mg by mouth 2 (two) times daily before a meal.   ranolazine 500 MG 12 hr tablet Commonly known as: RANEXA Take 1 tablet (500 mg total) by mouth 2 (two) times daily.   Stiolto Respimat 2.5-2.5 MCG/ACT Aers Generic drug: Tiotropium Bromide-Olodaterol Inhale 2 puffs into the lungs daily.   Systane Balance 0.6 % Soln Generic drug: Propylene Glycol Place 1 drop into both eyes See admin instructions. Place 1 drop into each eye four times a day and apply a warm compress for 10 minutes afterwards   Vitamin D3 50 MCG (2000 UT) Tabs Take 2,000 Units by mouth daily.   Xopenex HFA 45 MCG/ACT inhaler Generic drug: levalbuterol Inhale 2 puffs into the lungs every 6 (six) hours as needed for wheezing or shortness of breath.        Outstanding Labs/Studies   None   Duration of Discharge Encounter   Greater than 30 minutes including physician time.  Signed, Georgie Chard, NP 12/07/2019, 8:53 AM    I have seen and examined this patient with Georgie Chard.  Agree with above, note added to reflect my findings.  On exam, RRR, no murmurs.  Patient into the hospital after ICD shock.  He is currently feeling well and is without complaint.  He  has a history of ventricular tachycardia.  He currently takes both mexiletine and amiodarone.  We Joshus Rogan plan to increase mexiletine and amiodarone and start him on Ranexa.  Faithe Ariola discharge today with follow-up with his primary cardiologist at the [facility].  Christophor Eick M. Menachem Urbanek MD 12/08/2019 7:47 AM

## 2019-12-07 NOTE — H&P (Signed)
CARDIOLOGY ADMISSION NOTE  Patient ID: Max Rivas MRN: [id number] DOB/AGE: 07-24-1950 69 y.o.  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 Unclear why VT initiated despite on medical rx (amiod/mexilitine and recent ablation)  - increase amiodarone to 400mg  TID for now (may decrease to BID)  - increase mexilitine to 300mg  bid (pt was taking 150mg  TID) - replace K - HOLD eliquis incase the team decides to do LHC to evaluate his grafts (unclear if he had an LHC prior to VT ablation in 9/21)  - continue other home medications- lasix, aspirin, statin. Increase metoprolol xl to 25mg  daily He is not on entresto or aldactone- need to start GDMT. - copd on oxygen 3lts, stable.  Full code  HPI:  Max Rivas is a 69 y.o. 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 ICM with EF 40-45%, History of PAF on eliquis with recent [facility] 8/31 for afib, History of single lead AICD and recurrent VT RX with oral amiodarone and mexiletine presents to the ER with c/o palpitations and syncope s/p ICD shock  Patient reports doing well and without any major cardiac problems but suddenly started feeling palpitations and then passed out and hit his head and reports his ICD shocked him. His roommate withnessed the episode. Soon after regaining consciousness he was brought to the ER.  Preceding symptoms he denies any chest pain, heart  failure symptoms, no cough, cold or recent URI. He is on home oxygen 4-5 lts and is stable, compliant with meds, denies drug use.  [employer] ICD interrogation today Mode-  VVI VF >222bpm VF 133-222 bpm  Interrogation revealed he had 1 episode of VT ( at rate ~300bpm s/p ICD shock  He underwent EPS/ablation 09/26/19 seems a PVC/VT ablation, his record notes: A comprehensive EP study was performed and patient underwent successful radiofrequency ablation of his arrhythmia, with successful cardioversion Complicated by R groin hematoma requiring surgical repair 09/27/19 09/29/19 had gen change  Device information MDT single chamber implanted/new RV lead and device3/07/2011 >> gen change 09/29/2019 Initial implant 2005(RV lead was 6949 lead, is abandoned)  EKG:  NSR with PACs IVCD  Cardiac work up: ECHO: 09/09/19 IMPRESSIONS  1. Left ventricular ejection fraction, by estimation, is 30 to 35%. The  left ventricle has moderately decreased function. The left ventricle  demonstrates global hypokinesis. The left ventricular internal cavity size  was moderately dilated. Left  ventricular diastolic parameters are consistent with Grade I diastolic  dysfunction (impaired relaxation).  2. Right ventricular systolic function is moderately reduced. The right  ventricular size is mildly enlarged. There is moderately elevated  pulmonary artery systolic pressure.  3. Left atrial size was severely dilated.  4. Right atrial size was mildly dilated.  5. The mitral valve is normal in structure. Mild mitral valve  regurgitation. No evidence of mitral stenosis.  6. The  aortic valve is normal in structure. Aortic valve regurgitation is  not visualized. No aortic stenosis is present.  7. The inferior vena cava is dilated in size with <50% respiratory  variability, suggesting right atrial pressure of 15 mmHg.   Past Medical History:  Diagnosis Date  . Arthritis    "elbows, knees" (02/24/2016)  .  Bronchitis 2006  . CAD (coronary artery disease)    a. s/p prior MIs - 1995 x 2, 1998; b. s/p prior LCX stenting; c. 04/2019 Cath: LM min irregs, LAD 65p/mi, D1 75, LCX 99ost/p, 29m (ISR), OM2 80, RCA/RPDA mod diff dzs; d. 05/2019 CABG x 3: LIMA->LAD, VG->Diag, VG->OM.  Marland Kitchen COPD (chronic obstructive pulmonary disease) (HCC)    a. Remote tobacco-->on home O2.  Marland Kitchen GERD (gastroesophageal reflux disease)   . 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 05/2019 Echo: EF 40-45%, gr2 DD. Nl RV size/fxn. Mildly dil LA. Mild MR.  . High cholesterol   . Ischemic cardiomyopathy    a. 05/2019 Echo: EF 40-45%.  . Morbid obesity (HCC)   . Myocardial infarction [facility]) ~ 1995 X 2;1998; 2000; 2004  . On home oxygen therapy    "2L w/activity" (02/24/2016)  . PAF (paroxysmal atrial fibrillation) (HCC)    a. CHA2DS2VASc = 4-->eliquis. Also on amio.  . Pulmonary embolism (HCC) 02/24/2016  . Sleep apnea    "have CPAP; can't tolerate it" (02/24/2016)  . Ventricular tachycardia (HCC)    a. 2005 s/p ICD; b. 03/2011 Device upgrade/lead exchange: MDT Protecta XT VR single lead AICD.    Past Surgical History:  Procedure Laterality Date  . CARDIOVERSION N/A 09/11/2019   Procedure: CARDIOVERSION;  Surgeon: Jodelle Red, MD;  Location: [facility] ENDOSCOPY;  Service: Cardiovascular;  Laterality: N/A;  . CLIPPING OF ATRIAL APPENDAGE N/A 05/23/2019   Procedure: CLIPPING OF ATRIAL APPENDAGE with atriclip;  Surgeon: [name], MD;  Location: [facility] OR;  Service: Open Heart Surgery;  Laterality: N/A;  . CORONARY ANGIOPLASTY  1995  . CORONARY ANGIOPLASTY WITH STENT PLACEMENT  ~ 1995 - 2004 X 5   "I've got a total of 5 stents" (02/24/2016)  . CORONARY ARTERY BYPASS GRAFT N/A 05/23/2019   Procedure: CORONARY ARTERY BYPASS GRAFTING (CABG), times three, using right greater saphenous vein and left internal mammary;  Surgeon: [name], MD;  Location: [facility] OR;  Service: Open Heart Surgery;  Laterality: N/A;   [place] only  . 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 INTRAVASCULAR PRESSURE WIRE/FFR STUDY N/A 05/12/2019   Procedure: INTRAVASCULAR PRESSURE WIRE/FFR STUDY;  Surgeon: Lyn Records, MD;  Location: [facility] INVASIVE CV LAB;  Service: Cardiovascular;  Laterality: N/A;  . LEFT HEART CATH AND CORONARY ANGIOGRAPHY N/A 05/12/2019   Procedure: LEFT HEART CATH AND CORONARY ANGIOGRAPHY;  Surgeon: Lyn Records, MD;  Location: [facility] INVASIVE CV LAB;  Service: Cardiovascular;  Laterality: N/A;  . PACEMAKER GENERATOR CHANGE  03/2011    [facility]  . TEE WITHOUT CARDIOVERSION N/A 05/23/2019   Procedure: TRANSESOPHAGEAL ECHOCARDIOGRAM (TEE);  Surgeon: [name], MD;  Location: [facility] OR;  Service: Open Heart Surgery;  Laterality: N/A;  . TONSILLECTOMY      Allergies  Allergen Reactions  . Crestor [Rosuvastatin Calcium] Other (See Comments)    Back spasms, but can tolerate it at a low dose now   No current facility-administered medications on file prior to encounter.  Current Outpatient Medications on File Prior to Encounter  Medication Sig Dispense Refill  . acetaminophen (TYLENOL) 325 MG tablet Take 2 tablets (650 mg total) by mouth every 6 (six) hours as needed for mild pain (or Fever >/= 101). 30 tablet 0  . apixaban (ELIQUIS) 5 MG TABS tablet Take 5 mg by mouth 2 (two) times daily.    Marland Kitchen aspirin 81 MG chewable tablet Chew 1 tablet (81 mg total) by mouth daily. 90 tablet 3  . atorvastatin (LIPITOR) 80 MG tablet Take 1 tablet (80 mg total) by mouth daily. (Patient taking differently: Take 80 mg by mouth every evening. ) 90 tablet 3  . carboxymethylcellulose (REFRESH PLUS) 0.5 % SOLN Place 1 drop into both eyes at bedtime.    . Cholecalciferol (VITAMIN D3) 50 MCG (2000 UT) TABS Take 2,000 Units by mouth daily.    . furosemide (LASIX) 40 MG tablet Take 40 mg by mouth 2 (two) times daily.     Marland Kitchen gabapentin (NEURONTIN) 100 MG capsule Take 2 capsules (200  mg total) by mouth 2 (two) times daily. 60 capsule 1  . levalbuterol (XOPENEX HFA) 45 MCG/ACT inhaler Inhale 2 puffs into the lungs every 6 (six) hours as needed for wheezing or shortness of breath.    . levothyroxine (SYNTHROID) 75 MCG tablet Take 75 mcg by mouth daily before breakfast.    . metoprolol succinate (TOPROL-XL) 25 MG 24 hr tablet Take 12.5 mg by mouth daily.    Marland Kitchen mexiletine (MEXITIL) 150 MG capsule Take 150 mg by mouth 3 (three) times daily.     . mometasone (ASMANEX) 220 MCG/INH inhaler Inhale 2 puffs into the lungs 2 (two) times daily.     . montelukast (SINGULAIR) 10 MG tablet Take 10 mg by mouth daily.     . Multiple Vitamin (MULTIVITAMIN WITH MINERALS) TABS tablet Take 1 tablet by mouth daily.    . nitroGLYCERIN (NITROSTAT) 0.4 MG SL tablet Place 0.4 mg under the tongue every 5 (five) minutes as needed for chest pain.     Marland Kitchen omega-3 acid ethyl esters (LOVAZA) 1 g capsule Take 1 capsule (1 g total) by mouth 2 (two) times daily. 60 capsule 0  . omeprazole (PRILOSEC) 20 MG capsule Take 20 mg by mouth 2 (two) times daily before a meal.    . Propylene Glycol (SYSTANE BALANCE) 0.6 % SOLN Place 1 drop into both eyes See admin instructions. Place 1 drop into each eye four times a day and apply a warm compress for 10 minutes afterwards     . Tiotropium Bromide-Olodaterol (STIOLTO RESPIMAT) 2.5-2.5 MCG/ACT AERS Inhale 2 puffs into the lungs daily.      Social History   Socioeconomic History  . Marital status: Divorced    Spouse name: Not on file  . Number of children: Not on file  . Years of education: Not on file  . Highest education level: Not on file  Occupational History  . Not on file  Tobacco Use  . Smoking status: Former Smoker    Packs/day: 3.00    Years: 35.00    Pack years: 105.00    Types: Cigarettes    Quit date: 05/22/2019    Years since quitting: 0.5  . Smokeless tobacco: Former Neurosurgeon    Quit date: 05/21/2017  Vaping Use  . Vaping Use: Never used  Substance and  Sexual Activity  . Alcohol use: Not Currently  . Drug use: Not Currently    Types: Cocaine    Comment:  none since 1995  . Sexual activity: Yes  Other Topics Concern  . Not on file  Social History Narrative  . Not on file   Social Determinants of Health   Financial Resource Strain:   . Difficulty of Paying Living Expenses: Not on file  Food Insecurity:   . Worried About Programme researcher, broadcasting/film/video in the Last Year: Not on file  . Ran Out of Food in the Last Year: Not on file  Transportation Needs:   . Lack of Transportation (Medical): Not on file  . Lack of Transportation (Non-Medical): Not on file  Physical Activity:   . Days of Exercise per Week: Not on file  . Minutes of Exercise per Session: Not on file  Stress:   . Feeling of Stress : Not on file  Social Connections:   . Frequency of Communication with Friends and Family: Not on file  . Frequency of Social Gatherings with Friends and Family: Not on file  . Attends Religious Services: Not on file  . Active Member of Clubs or Organizations: Not on file  . Attends Banker Meetings: Not on file  . Marital Status: Not on file  Intimate Partner Violence:   . Fear of Current or Ex-Partner: Not on file  . Emotionally Abused: Not on file  . Physically Abused: Not on file  . Sexually Abused: Not on file    Family History  Problem Relation Age of Onset  . Heart attack Mother   . Heart attack Father   . Heart attack Sister 21     Review of Systems: [y] = yes,  = no       General: Weight gain ; Weight loss ; Anorexia ; Fatigue ; Fever ; Chills ; Weakness     Cardiac: Chest pain/pressure ; Resting SOB ; Exertional SOB ; Orthopnea ; Pedal Edema ; Palpitations [x ]; Syncope [x ]; Presyncope ; Paroxysmal nocturnal dyspnea[ ]     Pulmonary: Cough ; Wheezing[ ] ; Hemoptysis[ ] ; Sputum ; Snoring     GI: Vomiting[ ] ; Dysphagia[ ] ; Melena[ ] ; Hematochezia ; Heartburn[ ] ;  Abdominal pain ; Constipation ; Diarrhea ; BRBPR     GU: Hematuria[ ] ; Dysuria ; Nocturia[ ]   Vascular: Pain in legs with walking ; Pain in feet with lying flat ; Non-healing sores ; Stroke ; TIA ; Slurred speech ;    Neuro: Headaches[ ] ; Vertigo[ ] ; Seizures[ ] ; Paresthesias[ ] ;Blurred vision ; Diplopia ; Vision changes     Ortho/Skin: Arthritis ; Joint pain ; Muscle pain ; Joint swelling ; Back Pain ; Rash     Psych: Depression[ ] ; Anxiety[ ]     Heme: Bleeding problems ; Clotting disorders ; Anemia     Endocrine: Diabetes ; Thyroid dysfunction[ ]   Physical Exam: Blood pressure 109/60, pulse 62, temperature 97.6 F (36.4 C), temperature source Oral, resp. rate 20, height  (1.803 m), weight 91.6 kg, SpO2 95 %.   GENERAL: Patient is afebrile, Vital signs reviewed, Well appearing, Patient appears comfortable, Alert and lucid. EYES: Normal inspection. HEENT:  normocephalic, atraumatic , normal ENT inspection. ICD on the left chest ORAL:  Moist NECK:  supple , normal inspection. CARD:  regular rate and rhythm, heart sounds normal. RESP:  no  respiratory distress, breath sounds normal. ABD: soft, non tender to palpation , BS present, soft, no organomegaly or masses BACK: non-tender. No CVA tenderness. MUSC:  normal ROM, non-tender , no pedal edema . SKIN: color normal, no rash, warm, dry . NEURO: awake & alert, lucid, no motor/sensory deficit. Gait stable. PSYCH: mood/affect normal.   Labs: Lab Results  Component Value Date   BUN 12 12/06/2019   Lab Results  Component Value Date   CREATININE 1.24 12/06/2019   Lab Results  Component Value Date   NA 139 12/06/2019   K 3.5 12/06/2019   CL 100 12/06/2019   CO2 28 12/06/2019   Lab Results  Component Value Date   TROPONINI <0.03 06/22/2018   Lab Results  Component Value Date   WBC 8.3 12/06/2019   HGB 12.5 (L) 12/06/2019   HCT 39.3 12/06/2019    MCV 92.5 12/06/2019   PLT 146 (L) 12/06/2019   Lab Results  Component Value Date   CHOL 126 05/13/2019   HDL 36 (L) 05/13/2019   LDLCALC 70 05/13/2019   TRIG 38 09/19/2019   CHOLHDL 3.5 05/13/2019   Lab Results  Component Value Date   ALT 17 12/06/2019   AST 25 12/06/2019   ALKPHOS 65 12/06/2019   BILITOT 0.6 12/06/2019      Radiology:  CXR:   IMPRESSION: Chronic changes as described. Right mid and lower lung field scarring or infiltrate.  CT head- negative    Signed: Elmon Kirschner 12/07/2019, 5:31 AM

## 2019-12-07 NOTE — Plan of Care (Signed)

## 2019-12-07 NOTE — ED Notes (Signed)
[employer] contacted; rep to call this RN back to aid in interrogation

## 2019-12-07 NOTE — ED Notes (Signed)
[employer] rep at bedside for interrogation

## 2019-12-07 NOTE — ED Provider Notes (Signed)
[facility] EMERGENCY DEPARTMENT Provider Note   CSN: [id number] Arrival date & time: 12/06/19  2148     History Chief Complaint  Patient presents with  . Loss of Consciousness  . Pacemaker Problem    Max Rivas is a 69 y.o. male with a hx of CHF with last EF 30-35%, S/p ICD placement, ischemic cardiomyopathy, CAD, paroxysmal afib, vtach, chronic anticoagulation on Eliquis, COPD, chronic respiratory failure on 3L via [employer],  prior pulmonary embolism, and sleep apnea who presents to the ED via EMS S/p syncope that occurred shortly PTA. Patient states that he was at rest playing a board game with his roommate when he started to have palpitations like his heart was racing with chest pressure, dyspnea, & lightheadedness with subsequent syncopal episode. His roommate was at bedside and witnessed event, he did hit his head with syncope, she believed his pacemaker went off and called 911.  Currently patient feels back to baseline.  He has no current complaints.  He states that his pacemaker was recently replaced-per chart review was replaced 09/26/2019 at [facility] and had a VT ablation done at that time.  He has since been seen in the hospital for an episode of V. Tach.  He has been taking all of his medications as prescribed.  He denies fever, chills, cough, numbness, weakness, abdominal pain, or dysuria.  He denies any current chest pain, dyspnea, lightheadedness, or palpitations.  HPI     Past Medical History:  Diagnosis Date  . Arthritis    "elbows, knees" (02/24/2016)  . Bronchitis 2006  . CAD (coronary artery disease)    a. s/p prior MIs - 1995 x 2, 1998; b. s/p prior LCX stenting; c. 04/2019 Cath: LM min irregs, LAD 65p/mi, D1 75, LCX 99ost/p, 97m (ISR), OM2 80, RCA/RPDA mod diff dzs; d. 05/2019 CABG x 3: LIMA->LAD, VG->Diag, VG->OM.  Marland Kitchen COPD (chronic obstructive pulmonary disease) (HCC)    a. Remote tobacco-->on home O2.  Marland Kitchen GERD  (gastroesophageal reflux disease)   . 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 05/2019 Echo: EF 40-45%, gr2 DD. Nl RV size/fxn. Mildly dil LA. Mild MR.  . High cholesterol   . Ischemic cardiomyopathy    a. 05/2019 Echo: EF 40-45%.  . Morbid obesity (HCC)   . Myocardial infarction [facility]) ~ 1995 X 2;1998; 2000; 2004  . On home oxygen therapy    "2L w/activity" (02/24/2016)  . PAF (paroxysmal atrial fibrillation) (HCC)    a. CHA2DS2VASc = 4-->eliquis. Also on amio.  . Pulmonary embolism (HCC) 02/24/2016  . Sleep apnea    "have CPAP; can't tolerate it" (02/24/2016)  . Ventricular tachycardia (HCC)    a. 2005 s/p ICD; b. 03/2011 Device upgrade/lead exchange: MDT Protecta XT VR single lead AICD.    Patient Active Problem List   Diagnosis Date Noted  . Vomiting   . Acute respiratory failure with hypoxia (HCC) 09/18/2019  . Acute on chronic heart failure (HCC) 09/08/2019  . Unstable angina (HCC) 05/21/2019  . V-tach (HCC) 05/12/2019  . Chest pain   . VT (ventricular tachycardia) (HCC) 05/11/2019  . Arrhythmia 03/12/2019  . On home O2   . Chronic combined systolic and diastolic CHF (congestive heart failure) (HCC) 06/22/2018  . Chronic respiratory failure with hypoxia (HCC) 06/22/2018  . Congestive heart failure (CHF) (HCC) 06/22/2018  . DCM (dilated cardiomyopathy) (HCC) 05/25/2017  . Coronary artery disease 05/25/2017  . Chronic atrial fibrillation  05/25/2017  . COPD (chronic obstructive pulmonary disease) (HCC) 05/25/2017  . CKD (chronic kidney disease) 05/25/2017  . Hypothyroidism 05/25/2017  . 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 OSA (obstructive sleep apnea) 05/25/2017  . Pulmonary HTN (HCC) 05/25/2017  . Mediastinal adenopathy   . Cervical adenopathy   . Acute respiratory failure with hypoxia and hypercarbia (HCC) 02/24/2016  . Pulmonary embolus (HCC) 02/24/2016  . Pulmonary artery thrombosis (HCC)   . Pleural effusion    . Lung mass   . Pleural plaque   . Flutter-fibrillation 01/15/2016  . Acute systolic congestive heart failure (HCC)   . Atrial fibrillation with RVR (HCC)   . Hypercholesterolemia 03/11/2006  . Tobacco abuse 03/11/2006  . Acute on chronic systolic congestive heart failure (HCC) 03/11/2006  . GASTROESOPHAGEAL REFLUX, NO ESOPHAGITIS 03/11/2006  . OSTEOARTHRITIS, MULTI SITES 03/11/2006  . HIGH RISK PATIENT 03/11/2006  . Tobacco dependence 03/11/2006    Past Surgical History:  Procedure Laterality Date  . CARDIOVERSION N/A 09/11/2019   Procedure: CARDIOVERSION;  Surgeon: Jodelle Red, MD;  Location: [facility] ENDOSCOPY;  Service: Cardiovascular;  Laterality: N/A;  . CLIPPING OF ATRIAL APPENDAGE N/A 05/23/2019   Procedure: CLIPPING OF ATRIAL APPENDAGE with atriclip;  Surgeon: [name], MD;  Location: [facility] OR;  Service: Open Heart Surgery;  Laterality: N/A;  . CORONARY ANGIOPLASTY  1995  . CORONARY ANGIOPLASTY WITH STENT PLACEMENT  ~ 1995 - 2004 X 5   "I've got a total of 5 stents" (02/24/2016)  . CORONARY ARTERY BYPASS GRAFT N/A 05/23/2019   Procedure: CORONARY ARTERY BYPASS GRAFTING (CABG), times three, using right greater saphenous vein and left internal mammary;  Surgeon: [name], MD;  Location: [facility] OR;  Service: Open Heart Surgery;  Laterality: N/A;  [place] only  . 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 INTRAVASCULAR PRESSURE WIRE/FFR STUDY N/A 05/12/2019   Procedure: INTRAVASCULAR PRESSURE WIRE/FFR STUDY;  Surgeon: Lyn Records, MD;  Location: [facility] INVASIVE CV LAB;  Service: Cardiovascular;  Laterality: N/A;  . LEFT HEART CATH AND CORONARY ANGIOGRAPHY N/A 05/12/2019   Procedure: LEFT HEART CATH AND CORONARY ANGIOGRAPHY;  Surgeon: Lyn Records, MD;  Location: [facility] INVASIVE CV LAB;  Service: Cardiovascular;  Laterality: N/A;  . PACEMAKER GENERATOR CHANGE  03/2011    [facility]  . TEE WITHOUT CARDIOVERSION N/A 05/23/2019    Procedure: TRANSESOPHAGEAL ECHOCARDIOGRAM (TEE);  Surgeon: [name], MD;  Location[facility] OR;  Service: Open Heart Surgery;  Laterality: N/A;  . TONSILLECTOMY         Family History  Problem Relation Age of Onset  . Heart attack Mother   . Heart attack Father   . Heart attack Sister 88    Social History   Tobacco Use  . Smoking status: Former Smoker    Packs/day: 3.00    Years: 35.00    Pack years: 105.00    Types: Cigarettes    Quit date: 05/22/2019    Years since quitting: 0.5  . Smokeless tobacco: Former Neurosurgeon    Quit date: 05/21/2017  Vaping Use  . Vaping Use: Never used  Substance Use Topics  . Alcohol use: Not Currently  . Drug use: Not Currently    Types: Cocaine    Comment: none since 1995    Home Medications Prior to Admission medications   Medication Sig Start Date End Date Taking? Authorizing Provider  acetaminophen (TYLENOL) 325 MG  tablet Take 2 tablets (650 mg total) by mouth every 6 (six) hours as needed for mild pain (or Fever >/= 101). 03/05/16  Yes Alison Murray, MD  apixaban (ELIQUIS) 5 MG TABS tablet Take 5 mg by mouth 2 (two) times daily.   Yes [provider]  aspirin 81 MG chewable tablet Chew 1 tablet (81 mg total) by mouth daily. 05/16/19  Yes Kroeger, Dot Lanes M., PA-C  atorvastatin (LIPITOR) 80 MG tablet Take 1 tablet (80 mg total) by mouth daily. Patient taking differently: Take 80 mg by mouth every evening.  05/15/19  Yes Kroeger, Dot Lanes M., PA-C  carboxymethylcellulose (REFRESH PLUS) 0.5 % SOLN Place 1 drop into both eyes at bedtime.   Yes [provider]  Cholecalciferol (VITAMIN D3) 50 MCG (2000 UT) TABS Take 2,000 Units by mouth daily.   Yes [provider]  furosemide (LASIX) 40 MG tablet Take 40 mg by mouth 2 (two) times daily.    Yes [provider]  gabapentin (NEURONTIN) 100 MG capsule Take 2 capsules (200 mg total) by mouth 2 (two) times daily. 06/01/19  Yes Barrett, Erin R, PA-C  levalbuterol  (XOPENEX HFA) 45 MCG/ACT inhaler Inhale 2 puffs into the lungs every 6 (six) hours as needed for wheezing or shortness of breath.   Yes [provider]  levothyroxine (SYNTHROID) 75 MCG tablet Take 75 mcg by mouth daily before breakfast.   Yes [provider]  metoprolol succinate (TOPROL-XL) 25 MG 24 hr tablet Take 12.5 mg by mouth daily.   Yes [provider]  mexiletine (MEXITIL) 150 MG capsule Take 150 mg by mouth 3 (three) times daily.    Yes [provider]  mometasone (ASMANEX) 220 MCG/INH inhaler Inhale 2 puffs into the lungs 2 (two) times daily.    Yes [provider]  montelukast (SINGULAIR) 10 MG tablet Take 10 mg by mouth daily.    Yes [provider]  Multiple Vitamin (MULTIVITAMIN WITH MINERALS) TABS tablet Take 1 tablet by mouth daily.   Yes [provider]  nitroGLYCERIN (NITROSTAT) 0.4 MG SL tablet Place 0.4 mg under the tongue every 5 (five) minutes as needed for chest pain.    Yes [provider]  omega-3 acid ethyl esters (LOVAZA) 1 g capsule Take 1 capsule (1 g total) by mouth 2 (two) times daily. 03/05/16  Yes Alison Murray, MD  omeprazole (PRILOSEC) 20 MG capsule Take 20 mg by mouth 2 (two) times daily before a meal.   Yes [provider]  Propylene Glycol (SYSTANE BALANCE) 0.6 % SOLN Place 1 drop into both eyes See admin instructions. Place 1 drop into each eye four times a day and apply a warm compress for 10 minutes afterwards    Yes [provider]  Tiotropium Bromide-Olodaterol (STIOLTO RESPIMAT) 2.5-2.5 MCG/ACT AERS Inhale 2 puffs into the lungs daily.    Yes [provider]    Allergies    Crestor [rosuvastatin calcium]  Review of Systems   Review of Systems  Constitutional: Negative for chills and fever.  Respiratory: Positive for shortness of breath (resolved @ present). Negative for cough.   Cardiovascular: Positive for chest pain (resolved at present) and  palpitations (resolved @ present). Negative for leg swelling.  Gastrointestinal: Negative for abdominal pain, blood in stool, constipation, diarrhea, nausea and vomiting.  Genitourinary: Negative for dysuria.  Neurological: Positive for syncope.  All other systems reviewed and are negative.   Physical Exam Updated Vital Signs BP 110/61 (BP Location:  Right Arm)   Pulse 72   Temp 98.2 F (36.8 C) (Oral)   Resp 19   Ht  (1.803 m)   Wt 91.6 kg   SpO2 100%   BMI 28.17 kg/m   Physical Exam Vitals and nursing note reviewed.  Constitutional:      General: He is not in acute distress.    Appearance: He is well-developed. He is not toxic-appearing.  HENT:     Head:      Right Ear: No hemotympanum.     Left Ear: No hemotympanum.     Nose: Nose normal.     Mouth/Throat:     Pharynx: Uvula midline.  Eyes:     General:        Right eye: No discharge.        Left eye: No discharge.     Extraocular Movements: Extraocular movements intact.     Conjunctiva/sclera: Conjunctivae normal.     Pupils: Pupils are equal, round, and reactive to light.  Neck:     Comments: No midline tenderness.  Cardiovascular:     Rate and Rhythm: Normal rate and regular rhythm.     Comments: Surgical scar present to anterior chest wall.  Patient also has a pacemaker in place. Pulmonary:     Effort: Pulmonary effort is normal. No respiratory distress.     Breath sounds: Normal breath sounds. No wheezing, rhonchi or rales.  Chest:     Chest wall: No tenderness.  Abdominal:     General: There is no distension.     Palpations: Abdomen is soft.     Tenderness: There is no abdominal tenderness. There is no guarding or rebound.  Musculoskeletal:     Cervical back: Normal range of motion and neck supple.     Comments: He has intact active range of motion throughout the bilateral upper and lower extremities.  He has no point/focal bony tenderness to palpation.  His have trace metric bilateral pitting  edema.  No calf tenderness. No midline spinal tenderness.  Skin:    General: Skin is warm and dry.     Findings: No rash.  Neurological:     Mental Status: He is alert.     Comments: Clear speech.  CN III through XII grossly intact.  Sensation grossly intact bilateral upper and lower extremities.  5/5 symmetric grip strength and strength with plantar dorsiflexion bilaterally.  Normal finger-to-nose.  Negative pronator drift.  Psychiatric:        Behavior: Behavior normal.     ED Results / Procedures / Treatments   Labs (all labs ordered are listed, but only abnormal results are displayed) Labs Reviewed  COMPREHENSIVE METABOLIC PANEL - Abnormal; Notable for the following components:      Result Value   Glucose, Bld 126 (*)    Total Protein 5.9 (*)    Albumin 3.2 (*)    All other components within normal limits  CBC - Abnormal; Notable for the following components:   Hemoglobin 12.5 (*)    RDW 15.8 (*)    Platelets 146 (*)    All other components within normal limits  CBG MONITORING, ED - Abnormal; Notable for the following components:   Glucose-Capillary 115 (*)    All other components within normal limits  MAGNESIUM  TSH  TROPONIN I (HIGH SENSITIVITY)  TROPONIN I (HIGH SENSITIVITY)    EKG None  Radiology CT HEAD WO CONTRAST  Result Date: 12/06/2019 CLINICAL DATA:  Witnessed fall on Eliquis  EXAM: CT HEAD WITHOUT CONTRAST TECHNIQUE: Contiguous axial images were obtained from the base of the skull through the vertex without intravenous contrast. COMPARISON:  None. FINDINGS: Brain: No acute territorial infarction, hemorrhage or intracranial mass. Small chronic right posterior parietal infarct. Minimal hypodensity in the white matter. Nonenlarged ventricles. Vascular: No hyperdense vessels.  Carotid vascular calcification Skull: Normal. Negative for fracture or focal lesion. Sinuses/Orbits: No acute finding. Other: None IMPRESSION: 1. No CT evidence for acute intracranial  abnormality. 2. Small chronic right posterior parietal infarct. 3. Minimal small vessel ischemic changes of the white matter. Electronically Signed   By: Jasmine Pang M.D.   On: 12/06/2019 22:57   DG Chest Port 1 View  Result Date: 12/06/2019 CLINICAL DATA:  69 year old male with chest pain. EXAM: PORTABLE CHEST 1 VIEW COMPARISON:  Chest radiograph dated 11/08/2019. FINDINGS: There is chronic interstitial coarsening and scarring of the lung bases as well as peripheral aspect of the right mid lung field. Patchy area of streaky density in the right mid and right lower lung field may be chronic and represent scarring or infiltrate. Clinical correlation is recommended. Blunting of the right costophrenic angle, likely chronic and scarring. No pneumothorax. Stable cardiomegaly. Median sternotomy wires and CABG vascular clips and left AICD device. No acute osseous pathology. IMPRESSION: Chronic changes as described. Right mid and lower lung field scarring or infiltrate. Electronically Signed   By: Elgie Collard M.D.   On: 12/06/2019 22:40    Procedures Procedures (including critical care time)  Medications Ordered in ED Medications - No data to display  ED Course  I have reviewed the triage vital signs and the nursing notes.  Pertinent labs & imaging results that were available during my care of the patient were reviewed by me and considered in my medical decision making (see chart for details).    MDM Rules/Calculators/A&P                         Patient presents to the ED with complaints of syncope with defibrillator going off.  Nontoxic, vitals without significant abnormality, no complaints on arrival.   Additional history obtained:  Additional history obtained from chart review & nursing note review. Previous records obtained and reviewed- Patient had a recent ED visit 11/08/2019 during which he was found to be in a wide-complex tachycardia, had an AICD in place at that time, no  defibrillator discharge prior to visit, he received amiodarone prior to arrival, felt to be in stable V. tach.  Dr. Elberta Fortis evaluated the patient at bedside, ultimately was able to cardiovert the patient with ramp therapy ATP.  Patient was discharged home.  EKG: no STEMI  Lab Tests:  I Ordered, reviewed, and interpreted labs, which included:  CBC: Mild anemia and thrombocytopenia CMP: Mild hypoalbuminemia.  No significant electrolyte derangement. Magnesium: Within normal limits TSH: WNL Troponin: No significant elevation  Imaging Studies ordered:  I ordered imaging studies which included CT head and chest x-ray, I independently visualized and interpreted imaging which showed:  CXR: Chronic changes as described. Right mid and lower lung field scarring or infiltrate.  CT head: 1. No CT evidence for acute intracranial abnormality. 2. Small chronic right posterior parietal infarct. 3. Minimal small vessel ischemic changes of the white matter.  From a head injury standpoint no bleed on CT.  No midline spinal tenderness.  No chest/abdominal tenderness.  Moving all extremities and has ambulated since fall with syncope.  From a syncope standpoint, delay  in receiving defibrillator report due to technical difficulties, required rep to come to the emergency department, it appears the patient had an episode of V. tach that prompted defibrillation.  03:00: CONSULT: Discussed with cardiologist Dr. Brayton Layman- will admit patient to cardiology service.   Findings and plan of care discussed with supervising physician Dr. Jacqulyn Bath who is in agreement.   Portions of this note were generated with Scientist, clinical (histocompatibility and immunogenetics). Dictation errors may occur despite best attempts at proofreading.  Final Clinical Impression(s) / ED Diagnoses Final diagnoses:  V-tach [facility])    Rx / DC Orders ED Discharge Orders    None       Cherly Anderson, PA-C 12/07/19 0071    Maia Plan, MD 12/12/19 1040

## 2019-12-12 ENCOUNTER — Telehealth: Payer: Self-pay

## 2019-12-12 NOTE — Telephone Encounter (Addendum)
Left message for the pt to call back to make his appts. Will route to [place] [facility].   Transition Care Management Unsuccessful Follow-up Telephone Call  Date of discharge and from where:  12/07/19 from [facility]  Attempts:  1st Attempt  Reason for unsuccessful TCM follow-up call:  Left voice message

## 2019-12-12 NOTE — Telephone Encounter (Signed)
-----   Message from Filbert Schilder, NP sent at 12/07/2019  8:45 AM EST ----- Regarding: Appointment This patient needs a follow-up with EP, Dr. Ladona Ridgel or APP in the next 1 to 2 weeks.  Also needs a follow-up with general cardiology in 4 to 5 weeks with Dr. Katrinka Blazing or APP.  Please call patient with date and time of appointment

## 2019-12-13 NOTE — Telephone Encounter (Signed)
**Note De-Identified  Obfuscation** Transition Care Management Unsuccessful Follow-up Telephone Call  Date of discharge and from where: 12/07/2019 from [facility]  Attempts:  2nd  Reason for unsuccessful TCM follow-up call:  No answer so I left a message on his VM asking him to call Larita Fife back at [facility] at Dr Lubertha Basque and Dr Michaelle Copas office at [phone] and that if he calls back after 2:30 today to ask to s/w a triage nurse.

## 2019-12-14 NOTE — Telephone Encounter (Signed)
**Note De-Identified  Obfuscation** Transition Care Management Unsuccessful Follow-up Telephone Call  Date of discharge and from where: 12/07/2019 from [facility]  Attempts:  3rd and final  Reason for unsuccessful TCM follow-up call:  No answer so I left a detailed message (Ok per [facility]) on the pts VM asking the pt to call us back at [phone] as we need to f/u with him concerning his recent hospital stay and to schedule his post hospital f/u with [place].

## 2019-12-19 ENCOUNTER — Encounter: Payer: Self-pay | Admitting: Internal Medicine

## 2019-12-31 ENCOUNTER — Other Ambulatory Visit: Payer: Self-pay

## 2019-12-31 ENCOUNTER — Emergency Department (HOSPITAL_COMMUNITY): Payer: No Typology Code available for payment source

## 2019-12-31 ENCOUNTER — Observation Stay (HOSPITAL_COMMUNITY)
Admission: EM | Admit: 2019-12-31 | Discharge: 2020-01-01 | Disposition: A | Discharge status: Home / Self Care | Payer: No Typology Code available for payment source | Attending: Internal Medicine | Admitting: Internal Medicine

## 2019-12-31 ENCOUNTER — Encounter (HOSPITAL_COMMUNITY): Payer: Self-pay | Admitting: Emergency Medicine

## 2019-12-31 DIAGNOSIS — Z7901 Long term (current) use of anticoagulants: Secondary | ICD-10-CM | POA: Insufficient documentation

## 2019-12-31 DIAGNOSIS — Z7982 Long term (current) use of aspirin: Secondary | ICD-10-CM | POA: Insufficient documentation

## 2019-12-31 DIAGNOSIS — I480 Paroxysmal atrial fibrillation: Secondary | ICD-10-CM | POA: Insufficient documentation

## 2019-12-31 DIAGNOSIS — I5023 Acute on chronic systolic (congestive) heart failure: Secondary | ICD-10-CM | POA: Diagnosis not present

## 2019-12-31 DIAGNOSIS — Z20822 Contact with and (suspected) exposure to covid-19: Secondary | ICD-10-CM | POA: Insufficient documentation

## 2019-12-31 DIAGNOSIS — I2510 Atherosclerotic heart disease of native coronary artery without angina pectoris: Secondary | ICD-10-CM | POA: Diagnosis not present

## 2019-12-31 DIAGNOSIS — R079 Chest pain, unspecified: Secondary | ICD-10-CM | POA: Diagnosis present

## 2019-12-31 DIAGNOSIS — Z951 Presence of aortocoronary bypass graft: Secondary | ICD-10-CM | POA: Insufficient documentation

## 2019-12-31 DIAGNOSIS — E039 Hypothyroidism, unspecified: Secondary | ICD-10-CM | POA: Diagnosis not present

## 2019-12-31 DIAGNOSIS — Z87891 Personal history of nicotine dependence: Secondary | ICD-10-CM | POA: Diagnosis not present

## 2019-12-31 DIAGNOSIS — J449 Chronic obstructive pulmonary disease, unspecified: Secondary | ICD-10-CM | POA: Diagnosis not present

## 2019-12-31 LAB — CBC
HCT: 42.8 % (ref 39.0–52.0)
HCT: 40.7 % (ref 39.0–52.0)
Hemoglobin: 13.3 g/dL (ref 13.0–17.0)
Hemoglobin: 12.8 g/dL — ABNORMAL LOW (ref 13.0–17.0)
MCH: 29.4 pg (ref 26.0–34.0)
MCH: 29.7 pg (ref 26.0–34.0)
MCHC: 31.1 g/dL (ref 30.0–36.0)
MCHC: 31.4 g/dL (ref 30.0–36.0)
MCV: 94.5 fL (ref 80.0–100.0)
MCV: 94.4 fL (ref 80.0–100.0)
Platelets: 170 10*3/uL (ref 150–400)
Platelets: 175 10*3/uL (ref 150–400)
RBC: 4.53 MIL/uL (ref 4.22–5.81)
RBC: 4.31 MIL/uL (ref 4.22–5.81)
RDW: 15.7 % — ABNORMAL HIGH (ref 11.5–15.5)
RDW: 15.7 % — ABNORMAL HIGH (ref 11.5–15.5)
WBC: 6.3 10*3/uL (ref 4.0–10.5)
WBC: 5.5 10*3/uL (ref 4.0–10.5)
nRBC: 0 % (ref 0.0–0.2)
nRBC: 0 % (ref 0.0–0.2)

## 2019-12-31 LAB — TSH: TSH: 1.63 u[IU]/mL (ref 0.350–4.500)

## 2019-12-31 LAB — TROPONIN I (HIGH SENSITIVITY)
Troponin I (High Sensitivity): 10 ng/L (ref <18)
Troponin I (High Sensitivity): 11 ng/L (ref <18)

## 2019-12-31 LAB — COMPREHENSIVE METABOLIC PANEL
ALT: 12 U/L (ref 0–44)
AST: 16 U/L (ref 15–41)
Albumin: 3.2 g/dL — ABNORMAL LOW (ref 3.5–5.0)
Alkaline Phosphatase: 68 U/L (ref 38–126)
Anion gap: 7 (ref 5–15)
BUN: 8 mg/dL (ref 8–23)
CO2: 28 mmol/L (ref 22–32)
Calcium: 8.6 mg/dL — ABNORMAL LOW (ref 8.9–10.3)
Chloride: 103 mmol/L (ref 98–111)
Creatinine, Ser: 1.14 mg/dL (ref 0.61–1.24)
GFR, Estimated: >60 mL/min (ref >60)
Glucose, Bld: 160 mg/dL — ABNORMAL HIGH (ref 70–99)
Potassium: 3.9 mmol/L (ref 3.5–5.1)
Sodium: 138 mmol/L (ref 135–145)
Total Bilirubin: 0.8 mg/dL (ref 0.3–1.2)
Total Protein: 6 g/dL — ABNORMAL LOW (ref 6.5–8.1)

## 2019-12-31 LAB — BASIC METABOLIC PANEL
Anion gap: 11 (ref 5–15)
BUN: 8 mg/dL (ref 8–23)
CO2: 26 mmol/L (ref 22–32)
Calcium: 9.1 mg/dL (ref 8.9–10.3)
Chloride: 105 mmol/L (ref 98–111)
Creatinine, Ser: 1.17 mg/dL (ref 0.61–1.24)
GFR, Estimated: >60 mL/min (ref >60)
Glucose, Bld: 110 mg/dL — ABNORMAL HIGH (ref 70–99)
Potassium: 5 mmol/L (ref 3.5–5.1)
Sodium: 142 mmol/L (ref 135–145)

## 2019-12-31 LAB — BRAIN NATRIURETIC PEPTIDE: B Natriuretic Peptide: 339.1 pg/mL — ABNORMAL HIGH (ref 0.0–100.0)

## 2019-12-31 LAB — RESP PANEL BY RT-PCR (FLU A&B, COVID) ARPGX2
Influenza A by PCR: NEGATIVE
Influenza B by PCR: NEGATIVE
SARS Coronavirus 2 by RT PCR: NEGATIVE

## 2019-12-31 LAB — CBG MONITORING, ED: Glucose-Capillary: 83 mg/dL (ref 70–99)

## 2019-12-31 LAB — PHOSPHORUS: Phosphorus: 4 mg/dL (ref 2.5–4.6)

## 2019-12-31 LAB — HEMOGLOBIN A1C
Hgb A1c MFr Bld: 5.4 % (ref 4.8–5.6)
Mean Plasma Glucose: 108.28 mg/dL

## 2019-12-31 LAB — MAGNESIUM: Magnesium: 2 mg/dL (ref 1.7–2.4)

## 2019-12-31 MED ORDER — MEXILETINE HCL 150 MG PO CAPS
300.0000 mg | ORAL_CAPSULE | Freq: Two times a day (BID) | ORAL | Status: DC
  Administered 2019-12-31 – 2020-01-01 (×2): 300 mg via ORAL
  Filled 2019-12-31 (×4): qty 2

## 2019-12-31 MED ORDER — AMIODARONE HCL 200 MG PO TABS
400.0000 mg | ORAL_TABLET | Freq: Three times a day (TID) | ORAL | Status: DC
  Administered 2019-12-31 – 2020-01-01 (×2): 400 mg via ORAL
  Filled 2019-12-31 (×2): qty 2

## 2019-12-31 MED ORDER — NITROGLYCERIN 0.4 MG SL SUBL: 0.4000 mg | SUBLINGUAL_TABLET | SUBLINGUAL | Status: DC | PRN

## 2019-12-31 MED ORDER — METOPROLOL SUCCINATE ER 25 MG PO TB24
12.5000 mg | ORAL_TABLET | Freq: Every day | ORAL | Status: DC
  Administered 2020-01-01: 12.5 mg via ORAL
  Filled 2019-12-31: qty 1

## 2019-12-31 MED ORDER — BUDESONIDE 0.25 MG/2ML IN SUSP
0.2500 mg | Freq: Two times a day (BID) | RESPIRATORY_TRACT | Status: DC
  Administered 2020-01-01: 0.25 mg via RESPIRATORY_TRACT
  Filled 2019-12-31: qty 2

## 2019-12-31 MED ORDER — PANTOPRAZOLE SODIUM 40 MG PO TBEC
40.0000 mg | DELAYED_RELEASE_TABLET | Freq: Every day | ORAL | Status: DC
  Administered 2020-01-01: 40 mg via ORAL
  Filled 2019-12-31: qty 1

## 2019-12-31 MED ORDER — LEVOTHYROXINE SODIUM 75 MCG PO TABS
75.0000 ug | ORAL_TABLET | Freq: Every day | ORAL | Status: DC
  Administered 2020-01-01: 75 ug via ORAL
  Filled 2019-12-31: qty 1

## 2019-12-31 MED ORDER — MONTELUKAST SODIUM 10 MG PO TABS
10.0000 mg | ORAL_TABLET | Freq: Every day | ORAL | Status: DC
  Administered 2020-01-01: 10 mg via ORAL
  Filled 2019-12-31: qty 1

## 2019-12-31 MED ORDER — CALCIUM CARBONATE ANTACID 500 MG PO CHEW
1.0000 | CHEWABLE_TABLET | Freq: Once | ORAL | Status: AC
  Administered 2019-12-31: 200 mg via ORAL
  Filled 2019-12-31: qty 1

## 2019-12-31 MED ORDER — PROPYLENE GLYCOL 0.6 % OP SOLN: 1.0000 [drp] | OPHTHALMIC | Status: DC

## 2019-12-31 MED ORDER — APIXABAN 5 MG PO TABS
5.0000 mg | ORAL_TABLET | Freq: Two times a day (BID) | ORAL | Status: DC
  Administered 2019-12-31 – 2020-01-01 (×2): 5 mg via ORAL
  Filled 2019-12-31 (×2): qty 1

## 2019-12-31 MED ORDER — POLYVINYL ALCOHOL 1.4 % OP SOLN
1.0000 [drp] | Freq: Every day | OPHTHALMIC | Status: DC
  Administered 2019-12-31: 1 [drp] via OPHTHALMIC
  Filled 2019-12-31: qty 15

## 2019-12-31 MED ORDER — SODIUM CHLORIDE 0.9% FLUSH
3.0000 mL | Freq: Two times a day (BID) | INTRAVENOUS | Status: DC
  Administered 2019-12-31: 3 mL via INTRAVENOUS

## 2019-12-31 MED ORDER — GABAPENTIN 100 MG PO CAPS
200.0000 mg | ORAL_CAPSULE | Freq: Two times a day (BID) | ORAL | Status: DC
  Administered 2019-12-31 – 2020-01-01 (×2): 200 mg via ORAL
  Filled 2019-12-31 (×2): qty 2

## 2019-12-31 MED ORDER — FUROSEMIDE 10 MG/ML IJ SOLN
40.0000 mg | Freq: Once | INTRAMUSCULAR | Status: AC
  Administered 2019-12-31: 40 mg via INTRAVENOUS
  Filled 2019-12-31: qty 4

## 2019-12-31 MED ORDER — ACETAMINOPHEN 650 MG RE SUPP: 650.0000 mg | Freq: Four times a day (QID) | RECTAL | Status: DC | PRN

## 2019-12-31 MED ORDER — ASPIRIN 81 MG PO CHEW
81.0000 mg | CHEWABLE_TABLET | Freq: Every day | ORAL | Status: DC
  Administered 2020-01-01: 81 mg via ORAL
  Filled 2019-12-31: qty 1

## 2019-12-31 MED ORDER — ACETAMINOPHEN 325 MG PO TABS: 650.0000 mg | ORAL_TABLET | Freq: Four times a day (QID) | ORAL | Status: DC | PRN

## 2019-12-31 MED ORDER — LEVALBUTEROL HCL 1.25 MG/0.5ML IN NEBU: 1.2500 mg | INHALATION_SOLUTION | Freq: Four times a day (QID) | RESPIRATORY_TRACT | Status: DC | PRN

## 2019-12-31 MED ORDER — ATORVASTATIN CALCIUM 80 MG PO TABS
80.0000 mg | ORAL_TABLET | Freq: Every day | ORAL | Status: DC
  Administered 2019-12-31 – 2020-01-01 (×2): 80 mg via ORAL
  Filled 2019-12-31 (×2): qty 1

## 2019-12-31 NOTE — ED Triage Notes (Addendum)
Pt arrives via [facility] from home with sudden onset of chest pain and tingling to bilateral arms at rest. Pt took 1sl nitro, total of 324mg  asa given en route. Wears 4L O2 at baseline. A/ox4, resp e/u, nad. Does have defibrillator, no shocks felt by patient today, last shock was thanksgiving. Pt is currently on eliquis for afib. Does endorse cough with clear mucus since last night.

## 2019-12-31 NOTE — H&P (Signed)
Date: 12/31/2019               Patient Name:  Max Rivas MRN: [id number]  DOB: 1950/03/28 Age / Sex: 69 y.o., male   PCP: Anson Fret, MD         Medical Service: Internal Medicine Teaching Service         Attending Physician: Dr. Jacalyn Lefevre, MD    First Contact: Dr. Sharrell Ku, MD Pager: [phone]  Second Contact: Dr. 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 Patient states that he was in his usual state of health until today when he noticed central chest pain that he described has chest pressure. He took 4 baby aspirins and 1 nitroglycerin which improved his pain. He had associated shortness of breath, diaphoresis and palpations. Due to these symptoms, he check his pulse, which was elevated to 120's-130's. He had some dizziness that has since improved. No nausea or vomiting. He has had numbness down his arms and finger tips bilaterally. Patient also reports some blurry that is now resolved. He denies any leg pain, abdominal pain, fever/chills, recent sick contact or cough. All of his symptoms have resolved at this point. Denies COVID exposures. He has received both of his COVID vaccines.   [facility] was asked to admit patient due to heart score of 6 and increased cardiac risk factors. He has an appointment with his cardiologist at 4 pm tomorrow.   Meds: Current Meds  Medication Sig  . acetaminophen (TYLENOL) 325 MG tablet Take 2 tablets (650 mg total) by mouth every 6 (six) hours as needed for mild pain (or Fever >/= 101).  Marland Kitchen  amiodarone (PACERONE) 400 MG tablet Take 1 tablet (400 mg total) by mouth 3 (three) times daily.  Marland Kitchen apixaban (ELIQUIS) 5 MG TABS tablet Take 5 mg by mouth 2 (two) times daily.  Marland Kitchen aspirin 81 MG chewable tablet Chew 1 tablet (81 mg total) by mouth daily.  Marland Kitchen atorvastatin (LIPITOR) 80 MG tablet Take 1 tablet (80 mg total) by mouth daily. (Patient taking differently: Take 80 mg by mouth every evening.)  . carboxymethylcellulose (REFRESH PLUS) 0.5 % SOLN Place 1 drop into both eyes at bedtime.  . Cholecalciferol (VITAMIN D3) 50 MCG (2000 UT) TABS Take 2,000 Units by mouth daily.  . furosemide (LASIX) 40 MG tablet Take 40 mg by mouth 2 (two) times daily.   Marland Kitchen gabapentin (NEURONTIN) 100 MG capsule Take 2 capsules (200 mg total) by mouth 2 (two) times daily.  Marland Kitchen levalbuterol (XOPENEX HFA) 45 MCG/ACT inhaler Inhale 2 puffs into the lungs every 6 (six) hours as needed for wheezing or shortness of breath.  . levothyroxine (SYNTHROID) 75 MCG tablet Take 75 mcg by mouth daily before breakfast.  . metoprolol succinate (TOPROL-XL) 25 MG 24 hr tablet Take 12.5 mg by mouth daily.  Marland Kitchen mexiletine (MEXITIL) 150 MG capsule Take 2 capsules (300 mg total) by mouth 2 (two) times daily.  . mometasone (ASMANEX) 220 MCG/INH inhaler Inhale 2 puffs  into the lungs 2 (two) times daily.  . montelukast (SINGULAIR) 10 MG tablet Take 10 mg by mouth daily.   . Multiple Vitamin (MULTIVITAMIN WITH MINERALS) TABS tablet Take 1 tablet by mouth daily.  . nitroGLYCERIN (NITROSTAT) 0.4 MG SL tablet Place 0.4 mg under the tongue every 5 (five) minutes as needed for chest pain.   Marland Kitchen omega-3 acid ethyl esters (LOVAZA) 1 g capsule Take 1 capsule (1 g total) by mouth 2 (two) times daily.  Marland Kitchen omeprazole (PRILOSEC) 20 MG capsule Take 20 mg by mouth 2 (two) times daily before a meal.  . Propylene Glycol (SYSTANE BALANCE) 0.6 % SOLN Place 1 drop into both eyes See admin instructions. Place 1 drop into each eye four times a day and apply a warm compress  for 10 minutes afterwards   . Tiotropium Bromide-Olodaterol 2.5-2.5 MCG/ACT AERS Inhale 2 puffs into the lungs daily.      Allergies: Allergies as of 12/31/2019 - Review Complete 12/31/2019  Allergen Reaction Noted  . Crestor [rosuvastatin calcium] Other (See Comments) 01/15/2016  . Rosuvastatin Other (See Comments) 01/15/2016   Past Medical History:  Diagnosis Date  . Arthritis    "elbows, knees" (02/24/2016)  . Bronchitis 2006  . CAD (coronary artery disease)    a. s/p prior MIs - 1995 x 2, 1998; b. s/p prior LCX stenting; c. 04/2019 Cath: LM min irregs, LAD 65p/mi, D1 75, LCX 99ost/p, 39m (ISR), OM2 80, RCA/RPDA mod diff dzs; d. 05/2019 CABG x 3: LIMA->LAD, VG->Diag, VG->OM.  Marland Kitchen COPD (chronic obstructive pulmonary disease) (HCC)    a. Remote tobacco-->on home O2.  Marland Kitchen GERD (gastroesophageal reflux disease)   . 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 05/2019 Echo: EF 40-45%, gr2 DD. Nl RV size/fxn. Mildly dil LA. Mild MR.  . High cholesterol   . Ischemic cardiomyopathy    a. 05/2019 Echo: EF 40-45%.  . Morbid obesity (HCC)   . Myocardial infarction [facility]) ~ 1995 X 2;1998; 2000; 2004  . On home oxygen therapy    "2L w/activity" (02/24/2016)  . PAF (paroxysmal atrial fibrillation) (HCC)    a. CHA2DS2VASc = 4-->eliquis. Also on amio.  . Pulmonary embolism (HCC) 02/24/2016  . Sleep apnea    "have CPAP; can't tolerate it" (02/24/2016)  . Ventricular tachycardia (HCC)    a. 2005 s/p ICD; b. 03/2011 Device upgrade/lead exchange: MDT Protecta XT VR single lead AICD.    Family History  Problem Relation Age of Onset  . Heart attack Mother   . Heart attack Father   . Heart attack Sister 40     Social History: Lives with a roommate. He does his own ADLs and does not use an assistive device to ambulate. He smoked for 3 packs/day for 37 years and quit smoking after his CABG in May.  Review of Systems: A complete ROS was negative except as per HPI.   Physical  Exam: Blood pressure 134/70, pulse 64, temperature 97.7 F (36.5 C), resp. rate 16, SpO2 96 %.  General: Pleasant, well-appearing elderly man laying in bed. No acute distress. Head: Normocephalic. Atraumatic. CV: RRR. No murmurs, rubs, or gallops. 1+ BLE pitting edema Pulmonary: Lungs CTAB. Normal effort. No wheezing or rales. Abdominal: Soft, nontender, nondistended. Normal bowel sounds. Extremities: Palpable pulses. Normal ROM. Skin: Warm and dry. No obvious rash or lesions. Neuro: A&Ox3. Moves all extremities. Normal sensation. No focal deficit. 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 Admiitted for observation.   #Chest pain #CAD s/ CABG (May 2021) Patient with significant cardiac hx presented with chest pressure that has since resolved after 4 ASA 81 mg and 1 nitro. EKG does not show any ST changes. Troponins wnl x2. Symptoms unlikely to be ACS in the setting normal cardiac markers and EKG. CP has now resolved.  --F/u repeat EKG --Tele --Nitro prn for CP --Continue Lipitor 80 mg daily --Continue ASA 81 mg daily  #Acute on Chronic HF w/ AICD Hx of HErEF w/ ECHO on 09/09/19 showing EF 30-35%, global hypokinesis and moderately elevated PASP. On lasix 40 mg BID but states he has been taking only 1 each day. Patient reports worsening dyspnea on exertion over the last few weeks. Report increased weight from 200 Ibs after last hospitalization to 213 lbs. Only able to lay flat  for a few minutes. CXR shows pleural effusion, L >R. 1+ BLE pitting edema to the mid-calf on exam.  --F/u BNP --Lasix 40 mg x1 dose if BNP elevated --Mg, Phos --CMP, CBC --Telemetry --Strict I&Os --Daily weights  #PAF #Reccurent VT Patient had a recent [facility] 8/31 for afib. On Eliquis for anticoagulation. EKG shows NSR on admission. Continue to monitor closely. --Tele --Continue metoprolol 12.5 mg daily --Continue Eliquis 5 mg BID --Continue Amiodarone 400 mg TID --Continue Mexitine 300 mg BID  #OSA #COPD Gold 4 COPD on 4 L of oxygen at home. Patient has had progressive SOB over the last 2-3 weeks. He denies any cough or recent sick contact. No signs of infection on CXR. Satting well on RA.  --Levalbuterol inhaler q6h prn for wheezing, SOB --Singulair 10 mg daily --Mometasone 2 puffs BID  #Hypothyroidism Stable. TSH normal at 2.16 three weeks ago.  --Continue Synthroid 75 mg daily   CODE STATUS: Full Code DIET: CM PPx: Eliquis 5 mg BID  Dispo: Admit patient to Observation with expected length of stay less than 2 midnights.  Signed: Steffanie Rainwater, MD 12/31/2019, 6:28 PM  Pager: [phone] Internal Medicine Teaching Service After 5pm on weekdays and 1pm on weekends: On Call pager: [phone]

## 2019-12-31 NOTE — ED Provider Notes (Signed)
[name] EMERGENCY DEPARTMENT Provider Note   CSN: [id number] Arrival date & time: 12/31/19  1331     History Chief Complaint  Patient presents with  . Chest Pain    STEVAN EBERWEIN is a 69 y.o. male with past medical history significant for CAD, COPD on home O2 4 L, heart failure, MI x 5, ventricular tachycardia. Has ICD. Anticoagulated on eliquis.  Cardiologist Dr. Katrinka Blazing.  HPI Patient presents to emergency department today via EMS with chief complaint of chest pain starting just prior to arrival.  He states he was sitting at the table when he had left-sided chest pain.  He described the chest pain as a pressure sensation.  His chest pain did not radiate however he had concurrent tingling of bilateral arms.  He rates the pain 7/10 in severity.  He became diaphoretic and clammy.  His wife thought he looked pale in the face. Chest pain feels similar to MI in the past.  He took a sublingual nitro and pain improved to 2/10.  Patient is confident his defibrillator did not fire as it has fired in the past and is extremely painful. On EMS arrival he was given 324 mg chewable aspirin. Patient also states that he has had his work of breathing x2 days.  He is unsure if this is COPD exacerbation.  He states he has been using his inhaler more than usual.  He denies any changes in his oxygen requirement.  He asked used his inhaler this morning. He endorses productive cough with white/clear phlegm. Denies fever, chills, congestion, hemoptysis, palpitations, syncope, back pain, abdominal pan, nausea, emesis,  back pain, urinary symptoms, diarrhea, lower extremity edema. Denies drug use or alcohol consumption.   Chart review shows patient seen in ED approximately two months ago 11/08/2019 for Vtach.     Past Medical History:  Diagnosis Date  . Arthritis    "elbows, knees" (02/24/2016)  . Bronchitis 2006  . CAD (coronary artery disease)    a. s/p prior MIs - 1995 x 2, 1998; b. s/p  prior LCX stenting; c. 04/2019 Cath: LM min irregs, LAD 65p/mi, D1 75, LCX 99ost/p, 30m (ISR), OM2 80, RCA/RPDA mod diff dzs; d. 05/2019 CABG x 3: LIMA->LAD, VG->Diag, VG->OM.  Marland Kitchen COPD (chronic obstructive pulmonary disease) (HCC)    a. Remote tobacco-->on home O2.  Marland Kitchen GERD (gastroesophageal reflux disease)   . 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 05/2019 Echo: EF 40-45%, gr2 DD. Nl RV size/fxn. Mildly dil LA. Mild MR.  . High cholesterol   . Ischemic cardiomyopathy    a. 05/2019 Echo: EF 40-45%.  . Morbid obesity (HCC)   . Myocardial infarction [facility]) ~ 1995 X 2;1998; 2000; 2004  . On home oxygen therapy    "2L w/activity" (02/24/2016)  . PAF (paroxysmal atrial fibrillation) (HCC)    a. CHA2DS2VASc = 4-->eliquis. Also on amio.  . Pulmonary embolism (HCC) 02/24/2016  . Sleep apnea    "have CPAP; can't tolerate it" (02/24/2016)  . Ventricular tachycardia (HCC)    a. 2005 s/p ICD; b. 03/2011 Device upgrade/lead exchange: MDT Protecta XT VR single lead AICD.    Patient Active Problem List   Diagnosis Date Noted  . Ventricular tachyarrhythmia (HCC) 12/07/2019  . Cardiomyopathy, ischemic   . Vomiting   . Acute respiratory failure with hypoxia (HCC) 09/18/2019  . Acute on chronic heart failure (HCC) 09/08/2019  . Unstable angina (HCC) 05/21/2019  . Ventricular tachycardia (HCC) 05/12/2019  .  Chest pain   . VT (ventricular tachycardia) (HCC) 05/11/2019  . Arrhythmia 03/12/2019  . On home O2   . Chronic HFrEF (heart failure with reduced ejection fraction) (HCC) 06/22/2018  . Chronic respiratory failure with hypoxia (HCC) 06/22/2018  . Congestive heart failure (CHF) (HCC) 06/22/2018  . DCM (dilated cardiomyopathy) (HCC) 05/25/2017  . Coronary artery disease 05/25/2017  . Chronic atrial fibrillation 05/25/2017  . COPD (chronic obstructive pulmonary disease) (HCC) 05/25/2017  . CKD (chronic kidney disease) 05/25/2017  . Hypothyroidism 05/25/2017  . ICD  (implantable cardioverter-defibrillator) in place 05/25/2017  . OSA (obstructive sleep apnea) 05/25/2017  . Pulmonary HTN (HCC) 05/25/2017  . Mediastinal adenopathy   . Cervical adenopathy   . Acute respiratory failure with hypoxia and hypercarbia (HCC) 02/24/2016  . Pulmonary embolus (HCC) 02/24/2016  . Pulmonary artery thrombosis (HCC)   . Pleural effusion   . Lung mass   . Pleural plaque   . Flutter-fibrillation 01/15/2016  . Acute systolic congestive heart failure (HCC)   . Atrial fibrillation with RVR (HCC)   . Hypercholesterolemia 03/11/2006  . Tobacco abuse 03/11/2006  . Acute on chronic systolic congestive heart failure (HCC) 03/11/2006  . GASTROESOPHAGEAL REFLUX, NO ESOPHAGITIS 03/11/2006  . OSTEOARTHRITIS, MULTI SITES 03/11/2006  . HIGH RISK PATIENT 03/11/2006  . Tobacco dependence 03/11/2006    Past Surgical History:  Procedure Laterality Date  . CARDIOVERSION N/A 09/11/2019   Procedure: CARDIOVERSION;  Surgeon: Jodelle Red, MD;  Location: [facility] ENDOSCOPY;  Service: Cardiovascular;  Laterality: N/A;  . CLIPPING OF ATRIAL APPENDAGE N/A 05/23/2019   Procedure: CLIPPING OF ATRIAL APPENDAGE with atriclip;  Surgeon: [name], MD;  Location: [facility] OR;  Service: Open Heart Surgery;  Laterality: N/A;  . CORONARY ANGIOPLASTY  1995  . CORONARY ANGIOPLASTY WITH STENT PLACEMENT  ~ 1995 - 2004 X 5   "I've got a total of 5 stents" (02/24/2016)  . CORONARY ARTERY BYPASS GRAFT N/A 05/23/2019   Procedure: CORONARY ARTERY BYPASS GRAFTING (CABG), times three, using right greater saphenous vein and left internal mammary;  Surgeon: [name], MD;  Location: [facility] OR;  Service: Open Heart Surgery;  Laterality: N/A;  [place] only  . 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 INTRAVASCULAR PRESSURE WIRE/FFR STUDY N/A 05/12/2019   Procedure: INTRAVASCULAR PRESSURE WIRE/FFR STUDY;  Surgeon: Lyn Records, MD;  Location: [facility] INVASIVE  CV LAB;  Service: Cardiovascular;  Laterality: N/A;  . LEFT HEART CATH AND CORONARY ANGIOGRAPHY N/A 05/12/2019   Procedure: LEFT HEART CATH AND CORONARY ANGIOGRAPHY;  Surgeon: Lyn Records, MD;  Location: [facility] INVASIVE CV LAB;  Service: Cardiovascular;  Laterality: N/A;  . PACEMAKER GENERATOR CHANGE  03/2011    [facility] [place]  . TEE WITHOUT CARDIOVERSION N/A 05/23/2019   Procedure: TRANSESOPHAGEAL ECHOCARDIOGRAM (TEE);  Surgeon: [name], MD;  Location: [facility] OR;  Service: Open Heart Surgery;  Laterality: N/A;  . TONSILLECTOMY         Family History  Problem Relation Age of Onset  . Heart attack Mother   . Heart attack Father   . Heart attack Sister 76    Social History   Tobacco Use  . Smoking status: Former Smoker    Packs/day: 3.00    Years: 35.00    Pack years: 105.00    Types: Cigarettes    Quit date: 05/22/2019    Years since quitting: 0.6  . Smokeless tobacco: Former Neurosurgeon  Quit date: 05/21/2017  Vaping Use  . Vaping Use: Never used  Substance Use Topics  . Alcohol use: Not Currently  . Drug use: Not Currently    Types: Cocaine    Comment: none since 1995    Home Medications Prior to Admission medications   Medication Sig Start Date End Date Taking? Authorizing Provider  acetaminophen (TYLENOL) 325 MG tablet Take 2 tablets (650 mg total) by mouth every 6 (six) hours as needed for mild pain (or Fever >/= 101). 03/05/16  Yes Alison Murray, MD  amiodarone (PACERONE) 400 MG tablet Take 1 tablet (400 mg total) by mouth 3 (three) times daily. 12/07/19  Yes Georgie Chard D, NP  apixaban (ELIQUIS) 5 MG TABS tablet Take 5 mg by mouth 2 (two) times daily.   Yes [provider]  aspirin 81 MG chewable tablet Chew 1 tablet (81 mg total) by mouth daily. 05/16/19  Yes Kroeger, Dot Lanes M., PA-C  atorvastatin (LIPITOR) 80 MG tablet Take 1 tablet (80 mg total) by mouth daily. Patient taking differently: Take 80 mg by mouth every evening. 05/15/19  Yes Kroeger, Dot Lanes M., PA-C   carboxymethylcellulose (REFRESH PLUS) 0.5 % SOLN Place 1 drop into both eyes at bedtime.   Yes [provider]  Cholecalciferol (VITAMIN D3) 50 MCG (2000 UT) TABS Take 2,000 Units by mouth daily.   Yes [provider]  furosemide (LASIX) 40 MG tablet Take 40 mg by mouth 2 (two) times daily.    Yes [provider]  gabapentin (NEURONTIN) 100 MG capsule Take 2 capsules (200 mg total) by mouth 2 (two) times daily. 06/01/19  Yes Barrett, Erin R, PA-C  levalbuterol (XOPENEX HFA) 45 MCG/ACT inhaler Inhale 2 puffs into the lungs every 6 (six) hours as needed for wheezing or shortness of breath.   Yes [provider]  levothyroxine (SYNTHROID) 75 MCG tablet Take 75 mcg by mouth daily before breakfast.   Yes [provider]  metoprolol succinate (TOPROL-XL) 25 MG 24 hr tablet Take 12.5 mg by mouth daily.   Yes [provider]  mexiletine (MEXITIL) 150 MG capsule Take 2 capsules (300 mg total) by mouth 2 (two) times daily. 12/07/19  Yes Georgie Chard D, NP  mometasone [facility]) 220 MCG/INH inhaler Inhale 2 puffs into the lungs 2 (two) times daily.   Yes [provider]  montelukast (SINGULAIR) 10 MG tablet Take 10 mg by mouth daily.    Yes [provider]  Multiple Vitamin (MULTIVITAMIN WITH MINERALS) TABS tablet Take 1 tablet by mouth daily.   Yes [provider]  nitroGLYCERIN (NITROSTAT) 0.4 MG SL tablet Place 0.4 mg under the tongue every 5 (five) minutes as needed for chest pain.    Yes [provider]  omega-3 acid ethyl esters (LOVAZA) 1 g capsule Take 1 capsule (1 g total) by mouth 2 (two) times daily. 03/05/16  Yes Alison Murray, MD  omeprazole (PRILOSEC) 20 MG capsule Take 20 mg by mouth 2 (two) times daily before a meal.   Yes [provider]  Propylene Glycol (SYSTANE BALANCE) 0.6 % SOLN Place 1 drop into both eyes See admin instructions. Place 1 drop into each eye four times a day and apply a warm  compress for 10 minutes afterwards    Yes [provider]  Tiotropium Bromide-Olodaterol 2.5-2.5 MCG/ACT AERS Inhale 2 puffs into the lungs daily.    Yes [provider]  ranolazine (RANEXA) 500 MG 12 hr tablet Take 1 tablet (500  mg total) by mouth 2 (two) times daily. Patient not taking: No sig reported 12/07/19   Filbert Schilder, NP    Allergies    Crestor [rosuvastatin calcium] and Rosuvastatin  Review of Systems   Review of Systems All other systems are reviewed and are negative for acute change except as noted in the HPI.  Physical Exam Updated Vital Signs BP 114/66   Pulse 62   Temp 97.7 F (36.5 C)   Resp 16   SpO2 95%   Physical Exam Vitals and nursing note reviewed.  Constitutional:      General: He is not in acute distress.    Appearance: He is obese. He is not ill-appearing.  HENT:     Head: Normocephalic and atraumatic.     Right Ear: Tympanic membrane and external ear normal.     Left Ear: Tympanic membrane and external ear normal.     Nose: Nose normal.     Mouth/Throat:     Mouth: Mucous membranes are moist.     Pharynx: Oropharynx is clear.  Eyes:     General: No scleral icterus.       Right eye: No discharge.        Left eye: No discharge.     Extraocular Movements: Extraocular movements intact.     Conjunctiva/sclera: Conjunctivae normal.     Pupils: Pupils are equal, round, and reactive to light.  Neck:     Vascular: No JVD.  Cardiovascular:     Rate and Rhythm: Normal rate and regular rhythm.     Pulses: Normal pulses.          Radial pulses are 2+ on the right side and 2+ on the left side.     Heart sounds: Normal heart sounds.  Pulmonary:     Comments: Lungs clear to auscultation in all fields. Symmetric chest rise. No wheezing, rales, or rhonchi. Oxygen saturation is 95% on 4 L nasal cannula. He speaking in full sentences. No respiratory distress. Abdominal:     Comments: Abdomen is soft, non-distended, and non-tender in  all quadrants. No rigidity, no guarding. No peritoneal signs.  Musculoskeletal:        General: Normal range of motion.     Cervical back: Normal range of motion.     Right lower leg: No edema.     Left lower leg: No edema.  Skin:    General: Skin is warm and dry.     Capillary Refill: Capillary refill takes less than 2 seconds.     Comments: Equal tactile temperature in all extremities  Neurological:     Mental Status: He is oriented to person, place, and time.     GCS: GCS eye subscore is 4. GCS verbal subscore is 5. GCS motor subscore is 6.     Comments: Fluent speech, no facial droop.  Psychiatric:        Behavior: Behavior normal.     ED Results / Procedures / Treatments   Labs (all labs ordered are listed, but only abnormal results are displayed) Labs Reviewed  BASIC METABOLIC PANEL - Abnormal; Notable for the following components:      Result Value   Glucose, Bld 110 (*)    All other components within normal limits  CBC - Abnormal; Notable for the following components:   RDW 15.7 (*)    All other components within normal limits  RESP PANEL BY RT-PCR (FLU A&B, COVID) ARPGX2  CBG MONITORING, ED  TROPONIN I (  HIGH SENSITIVITY)  TROPONIN I (HIGH SENSITIVITY)    EKG EKG Interpretation  Date/Time:  Sunday December 31 2019 13:41:18 EST Ventricular Rate:  68 PR Interval:    QRS Duration: 115 QT Interval:  464 QTC Calculation: 494 R Axis:   4 Text Interpretation: Sinus rhythm Multiple premature complexes, vent & supraven Nonspecific intraventricular conduction delay Low voltage, extremity leads Probable anterolateral infarct, old No significant change since last tracing Confirmed by Jacalyn Lefevre [phone]) on 12/31/2019 1:56:46 PM   Radiology DG Chest Portable 1 View  Result Date: 12/31/2019 CLINICAL DATA:  Chest pain. EXAM: PORTABLE CHEST 1 VIEW COMPARISON:  December 06, 2019 FINDINGS: Stable AICD device. Stable cardiomegaly. The hila and mediastinum are normal. No  pneumothorax. Left greater than right small effusions, larger on the left and stable on the right. Atelectasis underlying the effusions. Pulmonary edema identified. No other acute abnormalities. IMPRESSION: 1. Cardiomegaly and pulmonary edema. 2. Left greater than right small effusions with underlying atelectasis, stable on the right and larger on the left. Electronically Signed   By: Gerome Sam III M.D   On: 12/31/2019 14:42    Procedures Procedures (including critical care time)  Medications Ordered in ED Medications - No data to display  ED Course  I have reviewed the triage vital signs and the nursing notes.  Pertinent labs & imaging results that were available during my care of the patient were reviewed by me and considered in my medical decision making (see chart for details).  MDM Rules/Calculators/A&P                          History provided by patient with additional history obtained from chart review.    69 year old male with significant cardiac history presenting with sudden onset of chest pain. Prehospital care included submental nitro and chewable aspirin. Patient is pain-free on ED arrival. He is afebrile, hemodynamically stable. He is on his current oxygen home regiment 4 L, no hypoxia currently. His lungs are clear to auscultation in all fields. He has normal work of breathing. No swelling noted to lower extremities. EKG without ischemic changes. Heart score of 6.  Chest pain work-up initiated. CBC and BMP are all unremarkable. First troponin is 10. Chest xray viewed by me shows cardiomegaly and pulmonary edema. Bilateral small effusions and underlying atelectasis. Agree with radiologist impression. Patient seen by ED attending Dr. Particia Nearing. She agrees with plan of care to admit for chest pain rule out given significant cardiac history with elevated heart score given his concerning history.  Patient care transferred to C. Aberman PA-C at the end of my shift pending delta  troponin. Patient presentation, ED course, and plan of care discussed with review of all pertinent labs and imaging. Please see hernote for further details regarding further ED course and disposition.    Portions of this note were generated with Scientist, clinical (histocompatibility and immunogenetics). Dictation errors may occur despite best attempts at proofreading.    Final Clinical Impression(s) / ED Diagnoses Final diagnoses:  None    Rx / DC Orders ED Discharge Orders    None       Kandice Hams 12/31/19 Darleen Crocker, MD 01/02/20 [id number]

## 2019-12-31 NOTE — ED Notes (Signed)
ED Provider at bedside. 

## 2019-12-31 NOTE — ED Provider Notes (Signed)
Received patient from Lloyd Huger, PA-C at shift change pending labs.  See her note for full HPI.  In short, patient is a 69 year old male who presents to the ED due to chest pressure on left side of chest which he notes feels like previous MI's associated with bilateral arm numbness/tingling. Chest pain also associated with diaphoresis and clammy feeling. Chest pressure improved after nitroglycerin. He is followed by Dr. Katrinka Blazing with [facility].  He has a history of CAD, COPD on chronic 4 L nasal cannula, congestive heart failure, history of MI x5, ventricular tachycardia. He has an ICD.   Patient also admits to increased shortness of breath x2 days. No change in oxygen requirement. Shortness of breath associated with productive cough. Denies fever and chills. Denies sick contacts and COVID exposures. He has received both of his COVID vaccines.   Plan from previous provider: Follow-up on labs. Patient will most likely need admission for chest pain rule out given heart score of 6 and increased risk factors.  Physical Exam  BP 124/62   Pulse 65   Temp 97.7 F (36.5 C)   Resp 14   SpO2 96%   Physical Exam Vitals and nursing note reviewed.  Constitutional:      General: He is not in acute distress.    Appearance: He is not toxic-appearing.  HENT:     Head: Normocephalic.  Eyes:     Pupils: Pupils are equal, round, and reactive to light.  Cardiovascular:     Rate and Rhythm: Normal rate and regular rhythm.     Pulses: Normal pulses.     Heart sounds: Normal heart sounds. No murmur heard. No friction rub. No gallop.   Pulmonary:     Effort: Pulmonary effort is normal.     Breath sounds: Normal breath sounds.  Abdominal:     General: Abdomen is flat. Bowel sounds are normal. There is no distension.     Palpations: Abdomen is soft.     Tenderness: There is no abdominal tenderness. There is no guarding or rebound.  Musculoskeletal:     Cervical back: Neck supple.      Comments: No lower extremity edema. Negative homan sign bilaterally.  Skin:    General: Skin is warm and dry.  Neurological:     General: No focal deficit present.     Mental Status: He is alert.  Psychiatric:        Mood and Affect: Mood normal.        Behavior: Behavior normal.     ED Course/Procedures   Clinical Course as of 12/31/19 1735  Sun Dec 31, 2019  1520 Troponin I (High Sensitivity): 10 [CA]  1735 Troponin I (High Sensitivity): 11 [CA]    Clinical Course User Index [CA] Mannie Stabile, PA-C    Procedures  MDM     Heart pathway: 6  Received patient from Lloyd Huger, PA-C at shift change pending labs. See her note for full MDM.   69 year old male presents to the ED due to left-sided chest pressure. History of 5 previous Mis which he notes feels similar to this episode. Upon arrival, vitals all within normal limits. Patient in no acute distress and non-toxic appearing. Physical exam reassuring. No lower extremity edema. Negative homan sign bilaterally. During my evaluation patient was chest pain free. He was given ASA by EMS en route to the ED. I have personally reviewed all labs and images from previous provider.   CXR personally reviewed which demonstrates: IMPRESSION:  1. Cardiomegaly and pulmonary edema.  2. Left greater than right small effusions with underlying  atelectasis, stable on the right and larger on the left.   EKG personally reviewed which demonstrates normal sinus rhythm with no signs of acute ischemia.  No major changes from previous EKG.  CBC reassuring with no leukocytosis and normal hemoglobin.  Initial troponin normal at 10.  Will obtain delta troponin to rule out ACS.  Patient will most likely need admission for chest pain rule out given elevated heart score of 6 and high risk individual.   Delta troponin flat. Low suspicion for ACS at this time. Low suspicion for PE/DVT. Dissection was considered, but thought to be less likely  given presentation. Will consult hospitalist for admission for chest pain rule out given elevated heart pathway score. Previous ED attending, Dr. Particia Nearing evaluated patient at bedside and agrees with assessment and plan.   Discussed case with inpatient service who agrees to admit patient for further evaluation of chest pain. COVID/influenza negative.      Mannie Stabile, PA-C 12/31/19 1837    Arby Barrette, MD 01/04/20 1131

## 2019-12-31 NOTE — Hospital Course (Addendum)
Max Rivas is a 69 y.o. male with PMH of Gold 4 COPD on home O2 (4L), CAD w/ CABG May 2021, ICM with EF 30-35% (09/09/19), PAF on eliquis with recent [facility] 8/31 for afib, AICD and recurrent VT on amiodarone and mexiletine who presented to [facility] via EMS with a complaint of chest pain that is now resolved. Admiitted for observation.    #Chest pain #CAD s/ CABG (May 2021) Patient with significant cardiac hx presented with chest pressure that resolved after taking 4 ASA 81 mg and 1 nitro at home before arrival to the Hospital. EKG did not show any ST changes. Troponins wnl x2. Symptoms. Repeat EKG did not show any ST changes. Lipitor and aspirin were continued. Patient did not have any CP during hospitalization.    Acute on Chronic HF w/ AICD Hx of HErEF w/ ECHO on 09/09/19 showing EF 30-35%, global hypokinesis and moderately elevated PASP. On lasix 40 mg BID but states he has been taking only 1 each day. Patient reports worsening dyspnea on exertion over the last few weeks. Reported increased weight from 200 Ibs after last hospitalization to 213 lbs. CXR shows pleural effusion, L >R. BNP was elevated to 339. He had a net I/O of -2.3 L overnight after 1 dose of IV lasix 40 mg and his SOB was improved. Exam showed trace BLE edema but no rales. His weight was down to 204 lbs on day of discharge. He was discharged home so he could go to his cardiologist appointment on the same day.    PAF Reccurent VT Patient had a recent [facility] 8/31 for afib. On Eliquis for anticoagulation. EKG showed NSR on admission. Tele showed a brief VT the night before discharge but patient was asymptomatic. We continued home metoprolol, Eliquis, Amiodarone and Mexitine.    OSA COPD Gold 4 COPD on 4 L of oxygen at home. Patient has had progressive SOB over the last 2-3 weeks. He denied any cough or recent sick contact. No signs of infection on CXR. Did not require O2 supplemementation. We continued home Levalbuterol inhaler, Singulair and  Mometasone   Hypothyroidism Stable. TSH was normal at 1.63. We continued home Synthroid 75 mg daily.

## 2020-01-01 DIAGNOSIS — R079 Chest pain, unspecified: Secondary | ICD-10-CM

## 2020-01-01 LAB — CBC
HCT: 40.4 % (ref 39.0–52.0)
Hemoglobin: 13 g/dL (ref 13.0–17.0)
MCH: 29.9 pg (ref 26.0–34.0)
MCHC: 32.2 g/dL (ref 30.0–36.0)
MCV: 92.9 fL (ref 80.0–100.0)
Platelets: 163 10*3/uL (ref 150–400)
RBC: 4.35 MIL/uL (ref 4.22–5.81)
RDW: 15.7 % — ABNORMAL HIGH (ref 11.5–15.5)
WBC: 8.5 10*3/uL (ref 4.0–10.5)
nRBC: 0 % (ref 0.0–0.2)

## 2020-01-01 LAB — COMPREHENSIVE METABOLIC PANEL
ALT: 13 U/L (ref 0–44)
AST: 15 U/L (ref 15–41)
Albumin: 3.2 g/dL — ABNORMAL LOW (ref 3.5–5.0)
Alkaline Phosphatase: 67 U/L (ref 38–126)
Anion gap: 14 (ref 5–15)
BUN: 9 mg/dL (ref 8–23)
CO2: 25 mmol/L (ref 22–32)
Calcium: 8.9 mg/dL (ref 8.9–10.3)
Chloride: 102 mmol/L (ref 98–111)
Creatinine, Ser: 1.26 mg/dL — ABNORMAL HIGH (ref 0.61–1.24)
GFR, Estimated: >60 mL/min (ref >60)
Glucose, Bld: 92 mg/dL (ref 70–99)
Potassium: 4.1 mmol/L (ref 3.5–5.1)
Sodium: 141 mmol/L (ref 135–145)
Total Bilirubin: 1.1 mg/dL (ref 0.3–1.2)
Total Protein: 6 g/dL — ABNORMAL LOW (ref 6.5–8.1)

## 2020-01-01 NOTE — Plan of Care (Signed)
  Problem: Education: Goal: Understanding of cardiac disease, CV risk reduction, and recovery process will improve Outcome: Adequate for Discharge Goal: Individualized Educational Video(s) Outcome: Adequate for Discharge   Problem: Activity: Goal: Ability to tolerate increased activity will improve Outcome: Adequate for Discharge   Problem: Cardiac: Goal: Ability to achieve and maintain adequate cardiovascular perfusion will improve Outcome: Adequate for Discharge   Problem: Health Behavior/Discharge Planning: Goal: Ability to safely manage health-related needs after discharge will improve Outcome: Adequate for Discharge   Problem: Education: Goal: Knowledge of General Education information will improve Description: Including pain rating scale, medication(s)/side effects and non-pharmacologic comfort measures Outcome: Adequate for Discharge   Problem: Health Behavior/Discharge Planning: Goal: Ability to manage health-related needs will improve Outcome: Adequate for Discharge   Problem: Clinical Measurements: Goal: Ability to maintain clinical measurements within normal limits will improve Outcome: Adequate for Discharge Goal: Will remain free from infection Outcome: Adequate for Discharge Goal: Diagnostic test results will improve Outcome: Adequate for Discharge Goal: Respiratory complications will improve Outcome: Adequate for Discharge Goal: Cardiovascular complication will be avoided Outcome: Adequate for Discharge   Problem: Activity: Goal: Risk for activity intolerance will decrease Outcome: Adequate for Discharge   Problem: Nutrition: Goal: Adequate nutrition will be maintained Outcome: Adequate for Discharge   Problem: Coping: Goal: Level of anxiety will decrease Outcome: Adequate for Discharge   Problem: Elimination: Goal: Will not experience complications related to bowel motility Outcome: Adequate for Discharge Goal: Will not experience complications  related to urinary retention Outcome: Adequate for Discharge   Problem: Pain Managment: Goal: General experience of comfort will improve Outcome: Adequate for Discharge   Problem: Safety: Goal: Ability to remain free from injury will improve Outcome: Adequate for Discharge   Problem: Skin Integrity: Goal: Risk for impaired skin integrity will decrease Outcome: Adequate for Discharge   Problem: Education: Goal: Ability to demonstrate management of disease process will improve Outcome: Adequate for Discharge Goal: Ability to verbalize understanding of medication therapies will improve Outcome: Adequate for Discharge Goal: Individualized Educational Video(s) Outcome: Adequate for Discharge   Problem: Activity: Goal: Capacity to carry out activities will improve Outcome: Adequate for Discharge   Problem: Cardiac: Goal: Ability to achieve and maintain adequate cardiopulmonary perfusion will improve Outcome: Adequate for Discharge

## 2020-01-01 NOTE — Progress Notes (Signed)
D/C instructions given and reviewed. No questions asked but encouraged to call with any concerns. Tele and IV removed, tolerated well. 

## 2020-01-01 NOTE — Discharge Summary (Signed)
Name: Max Rivas MRN: [id number] DOB: 21-May-1950 69 y.o. PCP: Anson Fret, MD  Date of Admission: 12/31/2019  1:31 PM Date of Discharge:  01/01/20 Attending Physician: Dr. Mayford Knife  Discharge Diagnosis: Principal Problem:   Acute on chronic systolic congestive heart failure [facility]) Active Problems:   Chest pain   Discharge Medications: Allergies as of 01/01/2020       Reactions   Crestor [rosuvastatin Calcium] Other (See Comments)   Back spasms, but can tolerate it at a low dose now   Rosuvastatin Other (See Comments)   Back spasms, but can tolerate it at a low dose now        Medication List     STOP taking these medications    ranolazine 500 MG 12 hr tablet Commonly known as: RANEXA       TAKE these medications    acetaminophen 325 MG tablet Commonly known as: TYLENOL Take 2 tablets (650 mg total) by mouth every 6 (six) hours as needed for mild pain (or Fever >/= 101).   amiodarone 400 MG tablet Commonly known as: PACERONE Take 1 tablet (400 mg total) by mouth 3 (three) times daily.   apixaban 5 MG Tabs tablet Commonly known as: ELIQUIS Take 5 mg by mouth 2 (two) times daily.   aspirin 81 MG chewable tablet Chew 1 tablet (81 mg total) by mouth daily.   atorvastatin 80 MG tablet Commonly known as: LIPITOR Take 1 tablet (80 mg total) by mouth daily. What changed: when to take this   carboxymethylcellulose 0.5 % Soln Commonly known as: REFRESH PLUS Place 1 drop into both eyes at bedtime.   furosemide 40 MG tablet Commonly known as: LASIX Take 40 mg by mouth 2 (two) times daily.   gabapentin 100 MG capsule Commonly known as: NEURONTIN Take 2 capsules (200 mg total) by mouth 2 (two) times daily.   levalbuterol 45 MCG/ACT inhaler Commonly known as: XOPENEX HFA Inhale 2 puffs into the lungs every 6 (six) hours as needed for wheezing or shortness of breath.   levothyroxine 75 MCG tablet Commonly known as: SYNTHROID Take 75 mcg by  mouth daily before breakfast.   metoprolol succinate 25 MG 24 hr tablet Commonly known as: TOPROL-XL Take 12.5 mg by mouth daily.   mexiletine 150 MG capsule Commonly known as: MEXITIL Take 2 capsules (300 mg total) by mouth 2 (two) times daily.   mometasone 220 MCG/INH inhaler Commonly known as: ASMANEX Inhale 2 puffs into the lungs 2 (two) times daily.   montelukast 10 MG tablet Commonly known as: SINGULAIR Take 10 mg by mouth daily.   multivitamin with minerals Tabs tablet Take 1 tablet by mouth daily.   nitroGLYCERIN 0.4 MG SL tablet Commonly known as: NITROSTAT Place 0.4 mg under the tongue every 5 (five) minutes as needed for chest pain.   omega-3 acid ethyl esters 1 g capsule Commonly known as: LOVAZA Take 1 capsule (1 g total) by mouth 2 (two) times daily.   omeprazole 20 MG capsule Commonly known as: PRILOSEC Take 20 mg by mouth 2 (two) times daily before a meal.   Systane Balance 0.6 % Soln Generic drug: Propylene Glycol Place 1 drop into both eyes See admin instructions. Place 1 drop into each eye four times a day and apply a warm compress for 10 minutes afterwards   Tiotropium Bromide-Olodaterol 2.5-2.5 MCG/ACT Aers Inhale 2 puffs into the lungs daily.   Vitamin D3 50 MCG (2000 UT) Tabs Take 2,000 Units by mouth  daily.        Disposition and follow-up:   Max Rivas was discharged from [facility] in Stable condition.  At the hospital follow up visit please address:  1.  Follow-up:  A. Acute on chronic CHF: Patient has had increased DOE. Symptoms were improving after 1 dose of IV lasix 40 mg. Please reassess and titrate lasix as he might not be at his dry weight at the moment.     B. Afib, recurrent VT: Patient presented for an evaluation after an episode of chest pain and HR in the 120-130s. He was chest pain free on arrival but likely had VT at the time of CP. Please interrogate device and counsel patient.    C. Mild AKI:  Patient had a mild bump in creatinine on day of discharge. Please monitor BMP as changes are made to his diuretic regimen.    D. COPD, OSA: Seems stable. Re-evaluate amount of oxygen needed or need for CPAP.   2.  Labs / imaging needed at time of follow-up: BMP  3.  Pending labs/ test needing follow-up: None  Follow-up Appointments:  Follow-up Information     Anson Fret, MD. Call in 1 week(s).   Specialty: Family Medicine Why: Make an appointment in 1-2 weeks Contact information: [address] [facility] [place] [address] [phone]         Marinus Maw, MD .   Specialty: Cardiology Contact information: [address] [place] [address] [phone]         Lyn Records, MD .   Specialty: Cardiology Contact information: [phone] [address] [place] [address] [phone]                 Hospital Course by problem list: Max Rivas is a 69 y.o. 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 Admiitted for observation.    #Chest pain #CAD s/ CABG (May 2021) Patient with significant cardiac hx presented with chest pressure that resolved after taking 4 ASA 81 mg and 1 nitro at home before arrival to the Hospital. EKG did not show any ST changes. Troponins wnl x2. Symptoms. Repeat EKG did not show any ST changes. Lipitor and aspirin were continued. Patient did not have any CP during hospitalization.    Acute on Chronic HF w/ AICD Hx of HErEF w/ ECHO on 09/09/19 showing EF 30-35%, global hypokinesis and moderately elevated PASP. On lasix 40 mg BID but states he has been taking only 1 each day. Patient reports worsening dyspnea on exertion over the last few weeks. Reported  increased weight from 200 Ibs after last hospitalization to 213 lbs. CXR shows pleural effusion, L >R. BNP was elevated to 339. He had a net I/O of -2.3 L overnight after 1 dose of IV lasix 40 mg and his SOB was improved. Exam showed trace BLE edema but no rales. His weight was down to 204 lbs on day of discharge. He was discharged home so he could go to his cardiologist appointment on the same day.    PAF Reccurent VT Patient had a recent [facility] 8/31 for afib. On Eliquis for anticoagulation. EKG showed NSR on admission. Tele showed  a brief VT the night before discharge but patient was asymptomatic. We continued home metoprolol, Eliquis, Amiodarone and Mexitine.    OSA COPD Gold 4 COPD on 4 L of oxygen at home. Patient has had progressive SOB over the last 2-3 weeks. He denied any cough or recent sick contact. No signs of infection on CXR. Did not require O2 supplemementation. We continued home Levalbuterol inhaler, Singulair and Mometasone   Hypothyroidism Stable. TSH was normal at 1.63. We continued home Synthroid 75 mg daily.     Discharge Vitals:   BP 124/67 (BP Location: Right Arm)   Pulse 61   Temp (!) 97.5 F (36.4 C) (Oral)   Resp 16   Ht 5\' 11"  (1.803 m)   Wt 92.9 kg   SpO2 100%   BMI 28.56 kg/m   Pertinent Labs, Studies, and Procedures:  CBC Latest Ref Rng & Units 01/01/2020 12/31/2019 12/31/2019  WBC 4.0 - 10.5 K/uL 8.5 5.5 6.3  Hemoglobin 13.0 - 17.0 g/dL 01/02/2020 12.8(L) 13.3  Hematocrit 39.0 - 52.0 % 40.4 40.7 42.8  Platelets 150 - 400 K/uL 163 175 170    CMP Latest Ref Rng & Units 01/01/2020 12/31/2019 12/31/2019  Glucose 70 - 99 mg/dL 92 01/02/2020) 027(X)  BUN 8 - 23 mg/dL 9 8 8   Creatinine 0.61 - 1.24 mg/dL 412(I) 7.86(V  Sodium 135 - 145 mmol/L 141 138 142  Potassium 3.5 - 5.1 mmol/L 4.1 3.9 5.0  Chloride 98 - 111 mmol/L 102 103 105  CO2 22 - 32 mmol/L 25 28 26   Calcium 8.9 - 10.3 mg/dL 8.9 6.72) 9.1  Total Protein 6.5 - 8.1 g/dL 6.0(L) 6.0(L) -  Total  Bilirubin 0.3 - 1.2 mg/dL 1.1 0.8 -  Alkaline Phos 38 - 126 U/L 67 68 -  AST 15 - 41 U/L 15 16 -  ALT 0 - 44 U/L 13 12 -    DG Chest Portable 1 View  Result Date: 12/31/2019 CLINICAL DATA:  Chest pain. EXAM: PORTABLE CHEST 1 VIEW COMPARISON:  December 06, 2019 FINDINGS: Stable AICD device. Stable cardiomegaly. The hila and mediastinum are normal. No pneumothorax. Left greater than right small effusions, larger on the left and stable on the right. Atelectasis underlying the effusions. Pulmonary edema identified. No other acute abnormalities. IMPRESSION: 1. Cardiomegaly and pulmonary edema. 2. Left greater than right small effusions with underlying atelectasis, stable on the right and larger on the left. Electronically Signed   By: 7.0(J III M.D   On: 12/31/2019 14:42     Discharge Instructions: Discharge Instructions     Diet - low sodium heart healthy   Complete by: As directed    Increase activity slowly   Complete by: As directed       Max Rivas,   It was a pleasure taking care of you at [facility]. You were admitted for chest pain and treated for worsening of your heart failure symtoms. We are discharging you home now that you are doing better. Please follow the following instructions.  1) Follow up with your cardiologist today to adjust your Lasix 2) Please continue taking your medications as prescribed.   Take care,  Dr. 01/02/2020, MD, MPH  Signed: Malen Gauze, MD 01/01/2020, 11:30 AM   Pager: [phone]

## 2020-01-01 NOTE — Evaluation (Signed)
Physical Therapy Evaluation Patient Details Name: Max Rivas MRN: [id number] DOB: 1951-01-09 Today's Date: 01/01/2020   History of Present Illness  CAILLOU Rivas is a 69 y.o. 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  Clinical Impression  Patient evaluated by Physical Therapy with no further acute PT needs identified. Pt ambulating 100 feet with no assistive device or physical assist. SpO2 91-96% on 4L O2, HR 63-107 with brief episode of VT noted towards end. Pt has good self awareness and recognition of deficits. Education reviewed regarding energy conservation techniques. All education has been completed and the patient has no further questions. No follow-up Physical Therapy or equipment needs. PT is signing off. Thank you for this referral.     Follow Up Recommendations No PT follow up    Equipment Recommendations  None recommended by PT    Recommendations for Other Services       Precautions / Restrictions Precautions Precautions: Other (comment) Precaution Comments: watch HR Restrictions Weight Bearing Restrictions: No      Mobility  Bed Mobility Overal bed mobility: Independent                  Transfers Overall transfer level: Independent Equipment used: None                Ambulation/Gait Ambulation/Gait assistance: Modified independent (Device/Increase time) ((increased time)) Gait Distance (Feet): 100 Feet Assistive device: None Gait Pattern/deviations: Step-through pattern;Decreased stride length Gait velocity: decreased   General Gait Details: Slower pace, self cues for activity pacing, no gross instability noted  Stairs            Wheelchair Mobility    Modified Rankin (Stroke Patients Only)       Balance Overall  balance assessment: Mild deficits observed, not formally tested                                           Pertinent Vitals/Pain Pain Assessment: No/denies pain    Home Living Family/patient expects to be discharged to:: Private residence Living Arrangements: Non-relatives/Friends (roommate) Available Help at Discharge: Friend(s);Available 24 hours/day Type of Home: House Home Access: Stairs to enter     Home Layout: One level Home Equipment: Grab bars - tub/shower;Shower seat;Bedside commode;Hand held shower head Additional Comments: on 4L O2 at home    Prior Function Level of Independence: Independent         Comments: Limited household ambulator, gets groceries delivered, roommate drives him to appointments     Hand Dominance   Dominant Hand: Right    Extremity/Trunk Assessment   Upper Extremity Assessment Upper Extremity Assessment: Overall WFL for tasks assessed    Lower Extremity Assessment Lower Extremity Assessment: Overall WFL for tasks assessed       Communication   Communication: No difficulties  Cognition Arousal/Alertness: Awake/alert Behavior During Therapy: WFL for tasks assessed/performed Overall Cognitive Status: Within Functional Limits for tasks assessed  General Comments      Exercises     Assessment/Plan    PT Assessment Patent does not need any further PT services  PT Problem List         PT Treatment Interventions      PT Goals (Current goals can be found in the Care Plan section)  Acute Rehab PT Goals Patient Stated Goal: walk farther distances PT Goal Formulation: All assessment and education complete, DC therapy    Frequency     Barriers to discharge        Co-evaluation               AM-PAC PT "6 Clicks" Mobility  Outcome Measure Help needed turning from your back to your side while in a flat bed without using bedrails?: None Help  needed moving from lying on your back to sitting on the side of a flat bed without using bedrails?: None Help needed moving to and from a bed to a chair (including a wheelchair)?: None Help needed standing up from a chair using your arms (e.g., wheelchair or bedside chair)?: None Help needed to walk in hospital room?: None Help needed climbing 3-5 steps with a railing? : A Little 6 Click Score: 23    End of Session Equipment Utilized During Treatment: Oxygen Activity Tolerance: Patient tolerated treatment well Patient left: in chair;with call bell/phone within reach   PT Visit Diagnosis: Difficulty in walking, not elsewhere classified (R26.2)    Time: 2993-7169 PT Time Calculation (min) (ACUTE ONLY): 15 min   Charges:   PT Evaluation $PT Eval Moderate Complexity: 1 Mod          Lillia Pauls, PT, DPT Acute Rehabilitation Services Pager [phone] Office [phone]   Norval Morton 01/01/2020, 8:46 AM

## 2020-01-01 NOTE — Progress Notes (Signed)
Subjective:   Hospital day: 1  Overnight event: A brief VT was noticed on tele overnight but patient was asymptomatic.  This AM, patient states he is doing much better. He has not had any chest pain since he has been at the hospital. States his shortness of breath has improved but he was only able to walk a few feet with PT today before you became tired and and oxygen level dropped. States he was able to walk more than he has at home but he is still not where he was before his last hospitalization. States he has good support system at home and okay with going home today so he can go to his cardiologist appt today.   Objective:  Vital signs in last 24 hours: Vitals:   12/31/19 2040 01/01/20 0029 01/01/20 0032 01/01/20 0415  BP: (!) 125/92  (!) 96/52   Pulse: 68  64   Resp: 16  16   Temp: 97.7 F (36.5 C)  97.7 F (36.5 C)   TempSrc: Oral  Oral   SpO2: 96%  94%   Weight: 94.7 kg 92.9 kg  92.9 kg  Height: 5\' 11"  (1.803 m)       Physical Exam  General: Pleasant, well-appearing elderly man laying in bed. No acute distress. CV: RRR. No M/R/G. Trace BLE pitting edema Pulmonary: Lungs CTAB. No wheezes or rales. Abdomen: Soft, nontender, nondistended. Normal BS Extremities: Palpable pulses. Normal ROM Skin: Warm and dry. Nor rashes or lesions Neuro: A&Ox3. Moves all extremities. No focal deficits.  Psych: Appropriate mood and affect.  Assessment/Plan: Max Rivas is a 69 y.o. 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 Admitted for observation. Plan for discharge today.  Principal Problem:   Acute on chronic systolic congestive heart failure [facility]) Active Problems:   Chest pain  #Chest pain #CAD s/ CABG (May 2021) Patient with significant  cardiac hx presented with chest pressure that has since resolved after 4 ASA 81 mg and 1 nitro. Patient has been chest pain-free since admission.  --Following up with his cardiologist today --Continue home meds.  #Acute on Chronic HF w/ AICD Hx of HErEF w/ ECHO on 09/09/19 showing EF 30-35%, global hypokinesis and moderately elevated PASP. On lasix 40 mg BID but states he has been taking only 1 each day. Net I/O of -2.3 L overnight s/p IV lasix 40 mg x1 dose. Weight down to 204 lbs from 208 lbs. Normal phos, Mag.This AM, SOB has improved and feels much better but continues to have DOE. Will need further titration of his diuretics.  --Will follow up with Cardiology today to adjust lasix dose. --Continued home lasix 40 mg BID  #PAF #Reccurent VT Patient had a recent [facility] 8/31 for afib. On metoprolol, On Eliquis for anticoagulation. EKG shows NSR on admission. Tele showed a brief VT overnight but patient was asymptomatic. Denies any dizziness or palpitations. Will need device interrogated at cardiologist.  -Continued home metoprolol 12.5 mg daily, Eliquis 5 mg BID, Amiodarone 400 mg TID and Mexitine 300 mg BID  #OSA #COPD Gold 4 COPD on 4 L of oxygen at home. Patient with O2 sats 95-98% on 4L. No wheezing on exam. Continued home levalbuterol, singulair and mometasone at discharge.  #Hypothyroidism Stable.  TSH normal at 1.630 during admission. Continue home Synthroid 75 mg daily.  --Continue  Diet: CM IVF: None VTE: Lovenox CODE: Full Code  Prior to Admission Living Arrangement: Home Anticipated Discharge Location: Home Barriers to Discharge: None Dispo: Anticipated discharge today   Signed: Steffanie Rainwater, MD 01/01/2020, 5:54 AM  Pager: [phone] Internal Medicine Teaching Service After 5pm on weekdays and 1pm on weekends: On Call pager: [phone]

## 2020-01-01 NOTE — Discharge Instructions (Addendum)
Max Rivas,   It was a pleasure taking care of you at [facility]. You were admitted for chest pain and treated for worsening of your heart failure symtoms. We are discharging you home now that you are doing better. Please follow the following instructions.  1) Follow up with your cardiologist today to adjust your Lasix 2) Please continue taking your medications as prescribed.   Take care,  Dr. Sharrell Ku, MD, MPH      Information on my medicine - ELIQUIS (apixaban)  Why was Eliquis prescribed for you? Eliquis was prescribed for you to reduce the risk of a blood clot forming that can cause a stroke if you have a medical condition called atrial fibrillation (a type of irregular heartbeat).  What do You need to know about Eliquis ? Take your Eliquis TWICE DAILY - one tablet in the morning and one tablet in the evening with or without food. If you have difficulty swallowing the tablet whole please discuss with your pharmacist how to take the medication safely.  Take Eliquis exactly as prescribed by your doctor and DO NOT stop taking Eliquis without talking to the doctor who prescribed the medication.  Stopping may increase your risk of developing a stroke.  Refill your prescription before you run out.  After discharge, you should have regular check-up appointments with your healthcare provider that is prescribing your Eliquis.  In the future your dose may need to be changed if your kidney function or weight changes by a significant amount or as you get older.  What do you do if you miss a dose? If you miss a dose, take it as soon as you remember on the same day and resume taking twice daily.  Do not take more than one dose of ELIQUIS at the same time to make up a missed dose.  Important Safety Information A possible side effect of Eliquis is bleeding. You should call your healthcare provider right away if you experience any of the following: ? Bleeding from an injury  or your nose that does not stop. ? Unusual colored urine (red or dark brown) or unusual colored stools (red or black). ? Unusual bruising for unknown reasons. ? A serious fall or if you hit your head (even if there is no bleeding).  Some medicines may interact with Eliquis and might increase your risk of bleeding or clotting while on Eliquis. To help avoid this, consult your healthcare provider or pharmacist prior to using any new prescription or non-prescription medications, including herbals, vitamins, non-steroidal anti-inflammatory drugs (NSAIDs) and supplements.  This website has more information on Eliquis (apixaban): http://www.eliquis.com/eliquis/home

## 2020-01-11 ENCOUNTER — Encounter: Payer: Medicare Other | Admitting: Physician Assistant

## 2020-01-13 ENCOUNTER — Emergency Department (HOSPITAL_COMMUNITY): Payer: No Typology Code available for payment source

## 2020-01-13 ENCOUNTER — Inpatient Hospital Stay (HOSPITAL_COMMUNITY)
Admission: EM | Admit: 2020-01-13 | Discharge: 2020-01-14 | DRG: 303 | Disposition: A | Discharge status: Home / Self Care | Payer: No Typology Code available for payment source | Attending: Student in an Organized Health Care Education/Training Program | Admitting: Student in an Organized Health Care Education/Training Program

## 2020-01-13 DIAGNOSIS — I482 Chronic atrial fibrillation, unspecified: Secondary | ICD-10-CM | POA: Diagnosis present

## 2020-01-13 DIAGNOSIS — Z86711 Personal history of pulmonary embolism: Secondary | ICD-10-CM | POA: Diagnosis not present

## 2020-01-13 DIAGNOSIS — E7800 Pure hypercholesterolemia, unspecified: Secondary | ICD-10-CM | POA: Diagnosis present

## 2020-01-13 DIAGNOSIS — N1831 Chronic kidney disease, stage 3a: Secondary | ICD-10-CM | POA: Diagnosis not present

## 2020-01-13 DIAGNOSIS — R079 Chest pain, unspecified: Secondary | ICD-10-CM | POA: Diagnosis not present

## 2020-01-13 DIAGNOSIS — I480 Paroxysmal atrial fibrillation: Secondary | ICD-10-CM | POA: Diagnosis not present

## 2020-01-13 DIAGNOSIS — K219 Gastro-esophageal reflux disease without esophagitis: Secondary | ICD-10-CM | POA: Diagnosis present

## 2020-01-13 DIAGNOSIS — Z87891 Personal history of nicotine dependence: Secondary | ICD-10-CM

## 2020-01-13 DIAGNOSIS — I214 Non-ST elevation (NSTEMI) myocardial infarction: Secondary | ICD-10-CM

## 2020-01-13 DIAGNOSIS — Z20822 Contact with and (suspected) exposure to covid-19: Secondary | ICD-10-CM | POA: Diagnosis present

## 2020-01-13 DIAGNOSIS — I495 Sick sinus syndrome: Secondary | ICD-10-CM | POA: Diagnosis present

## 2020-01-13 DIAGNOSIS — I130 Hypertensive heart and chronic kidney disease with heart failure and stage 1 through stage 4 chronic kidney disease, or unspecified chronic kidney disease: Secondary | ICD-10-CM | POA: Diagnosis present

## 2020-01-13 DIAGNOSIS — G4733 Obstructive sleep apnea (adult) (pediatric): Secondary | ICD-10-CM | POA: Diagnosis present

## 2020-01-13 DIAGNOSIS — I259 Chronic ischemic heart disease, unspecified: Secondary | ICD-10-CM

## 2020-01-13 DIAGNOSIS — E039 Hypothyroidism, unspecified: Secondary | ICD-10-CM | POA: Diagnosis not present

## 2020-01-13 DIAGNOSIS — I2510 Atherosclerotic heart disease of native coronary artery without angina pectoris: Secondary | ICD-10-CM | POA: Diagnosis not present

## 2020-01-13 DIAGNOSIS — Z9981 Dependence on supplemental oxygen: Secondary | ICD-10-CM

## 2020-01-13 DIAGNOSIS — I420 Dilated cardiomyopathy: Secondary | ICD-10-CM | POA: Diagnosis not present

## 2020-01-13 DIAGNOSIS — J449 Chronic obstructive pulmonary disease, unspecified: Secondary | ICD-10-CM

## 2020-01-13 DIAGNOSIS — I252 Old myocardial infarction: Secondary | ICD-10-CM | POA: Diagnosis not present

## 2020-01-13 DIAGNOSIS — Z888 Allergy status to other drugs, medicaments and biological substances status: Secondary | ICD-10-CM | POA: Diagnosis not present

## 2020-01-13 DIAGNOSIS — Z95810 Presence of automatic (implantable) cardiac defibrillator: Secondary | ICD-10-CM

## 2020-01-13 DIAGNOSIS — I25110 Atherosclerotic heart disease of native coronary artery with unstable angina pectoris: Principal | ICD-10-CM | POA: Diagnosis present

## 2020-01-13 DIAGNOSIS — Z6827 Body mass index (BMI) 27.0-27.9, adult: Secondary | ICD-10-CM

## 2020-01-13 DIAGNOSIS — I472 Ventricular tachycardia, unspecified: Secondary | ICD-10-CM

## 2020-01-13 DIAGNOSIS — Z951 Presence of aortocoronary bypass graft: Secondary | ICD-10-CM

## 2020-01-13 DIAGNOSIS — I5022 Chronic systolic (congestive) heart failure: Secondary | ICD-10-CM | POA: Diagnosis present

## 2020-01-13 DIAGNOSIS — Z8249 Family history of ischemic heart disease and other diseases of the circulatory system: Secondary | ICD-10-CM

## 2020-01-13 DIAGNOSIS — N189 Chronic kidney disease, unspecified: Secondary | ICD-10-CM | POA: Diagnosis present

## 2020-01-13 DIAGNOSIS — I208 Other forms of angina pectoris: Secondary | ICD-10-CM | POA: Diagnosis not present

## 2020-01-13 HISTORY — PX: OTHER SURGICAL HISTORY: SHX169

## 2020-01-13 LAB — BASIC METABOLIC PANEL
Anion gap: 13 (ref 5–15)
BUN: 11 mg/dL (ref 8–23)
CO2: 30 mmol/L (ref 22–32)
Calcium: 9.1 mg/dL (ref 8.9–10.3)
Chloride: 98 mmol/L (ref 98–111)
Creatinine, Ser: 1.5 mg/dL — ABNORMAL HIGH (ref 0.61–1.24)
GFR, Estimated: 51 mL/min — ABNORMAL LOW (ref >60)
Glucose, Bld: 93 mg/dL (ref 70–99)
Potassium: 3.5 mmol/L (ref 3.5–5.1)
Sodium: 141 mmol/L (ref 135–145)

## 2020-01-13 LAB — TROPONIN I (HIGH SENSITIVITY)
Troponin I (High Sensitivity): 93 ng/L — ABNORMAL HIGH (ref <18)
Troponin I (High Sensitivity): 65 ng/L — ABNORMAL HIGH (ref <18)

## 2020-01-13 LAB — CBC
HCT: 43.5 % (ref 39.0–52.0)
Hemoglobin: 13.9 g/dL (ref 13.0–17.0)
MCH: 29.8 pg (ref 26.0–34.0)
MCHC: 32 g/dL (ref 30.0–36.0)
MCV: 93.3 fL (ref 80.0–100.0)
Platelets: 169 10*3/uL (ref 150–400)
RBC: 4.66 MIL/uL (ref 4.22–5.81)
RDW: 14.5 % (ref 11.5–15.5)
WBC: 7.3 10*3/uL (ref 4.0–10.5)
nRBC: 0 % (ref 0.0–0.2)

## 2020-01-13 LAB — RESP PANEL BY RT-PCR (FLU A&B, COVID) ARPGX2
Influenza A by PCR: NEGATIVE
Influenza B by PCR: NEGATIVE
SARS Coronavirus 2 by RT PCR: NEGATIVE

## 2020-01-13 LAB — BRAIN NATRIURETIC PEPTIDE: B Natriuretic Peptide: 245.1 pg/mL — ABNORMAL HIGH (ref 0.0–100.0)

## 2020-01-13 LAB — MRSA PCR SCREENING: MRSA by PCR: NEGATIVE

## 2020-01-13 MED ORDER — LEVOTHYROXINE SODIUM 75 MCG PO TABS
75.0000 ug | ORAL_TABLET | Freq: Every day | ORAL | Status: DC
  Administered 2020-01-13 – 2020-01-14 (×2): 75 ug via ORAL
  Filled 2020-01-13 (×2): qty 1

## 2020-01-13 MED ORDER — APIXABAN 5 MG PO TABS
5.0000 mg | ORAL_TABLET | Freq: Two times a day (BID) | ORAL | Status: DC
  Administered 2020-01-13 – 2020-01-14 (×3): 5 mg via ORAL
  Filled 2020-01-13 (×3): qty 1

## 2020-01-13 MED ORDER — OMEGA-3-ACID ETHYL ESTERS 1 G PO CAPS
1.0000 g | ORAL_CAPSULE | Freq: Two times a day (BID) | ORAL | Status: DC
  Administered 2020-01-13 – 2020-01-14 (×3): 1 g via ORAL
  Filled 2020-01-13 (×4): qty 1

## 2020-01-13 MED ORDER — ACETAMINOPHEN 325 MG PO TABS: 650.0000 mg | ORAL_TABLET | Freq: Four times a day (QID) | ORAL | Status: DC | PRN

## 2020-01-13 MED ORDER — MEXILETINE HCL 150 MG PO CAPS
300.0000 mg | ORAL_CAPSULE | Freq: Two times a day (BID) | ORAL | Status: DC
  Administered 2020-01-13 – 2020-01-14 (×3): 300 mg via ORAL
  Filled 2020-01-13 (×4): qty 2

## 2020-01-13 MED ORDER — METOPROLOL SUCCINATE ER 25 MG PO TB24
12.5000 mg | ORAL_TABLET | Freq: Every day | ORAL | Status: DC
  Administered 2020-01-13 – 2020-01-14 (×2): 12.5 mg via ORAL
  Filled 2020-01-13 (×2): qty 1

## 2020-01-13 MED ORDER — AMIODARONE HCL 200 MG PO TABS
400.0000 mg | ORAL_TABLET | Freq: Three times a day (TID) | ORAL | Status: DC
Start: 2020-01-13 — End: 2020-01-14
  Administered 2020-01-13 – 2020-01-14 (×4): 400 mg via ORAL
  Filled 2020-01-13 (×4): qty 2

## 2020-01-13 MED ORDER — ACETAMINOPHEN 650 MG RE SUPP: 650.0000 mg | Freq: Four times a day (QID) | RECTAL | Status: DC | PRN

## 2020-01-13 MED ORDER — BUDESONIDE 0.5 MG/2ML IN SUSP
0.5000 mg | Freq: Two times a day (BID) | RESPIRATORY_TRACT | Status: DC
  Administered 2020-01-13 – 2020-01-14 (×3): 0.5 mg via RESPIRATORY_TRACT
  Filled 2020-01-13 (×3): qty 2

## 2020-01-13 MED ORDER — POLYETHYLENE GLYCOL 3350 17 G PO PACK: 17.0000 g | PACK | Freq: Every day | ORAL | Status: DC | PRN

## 2020-01-13 MED ORDER — POTASSIUM CHLORIDE CRYS ER 20 MEQ PO TBCR
40.0000 meq | EXTENDED_RELEASE_TABLET | Freq: Once | ORAL | Status: AC
  Administered 2020-01-13: 40 meq via ORAL
  Filled 2020-01-13: qty 2

## 2020-01-13 MED ORDER — POLYVINYL ALCOHOL 1.4 % OP SOLN
1.0000 [drp] | Freq: Every day | OPHTHALMIC | Status: DC
  Administered 2020-01-14: 1 [drp] via OPHTHALMIC
  Filled 2020-01-13 (×2): qty 15

## 2020-01-13 MED ORDER — UMECLIDINIUM-VILANTEROL 62.5-25 MCG/INH IN AEPB
1.0000 | INHALATION_SPRAY | Freq: Every day | RESPIRATORY_TRACT | Status: DC
  Administered 2020-01-14: 08:00:00 1 via RESPIRATORY_TRACT
  Filled 2020-01-13: qty 14

## 2020-01-13 MED ORDER — VITAMIN D 25 MCG (1000 UNIT) PO TABS
2000.0000 [IU] | ORAL_TABLET | Freq: Every day | ORAL | Status: DC
  Administered 2020-01-13 – 2020-01-14 (×2): 2000 [IU] via ORAL
  Filled 2020-01-13 (×3): qty 2

## 2020-01-13 MED ORDER — LEVALBUTEROL HCL 0.63 MG/3ML IN NEBU: 0.6300 mg | INHALATION_SOLUTION | Freq: Four times a day (QID) | RESPIRATORY_TRACT | Status: DC | PRN

## 2020-01-13 MED ORDER — ATORVASTATIN CALCIUM 80 MG PO TABS
80.0000 mg | ORAL_TABLET | Freq: Every day | ORAL | Status: DC
  Administered 2020-01-13 – 2020-01-14 (×2): 80 mg via ORAL
  Filled 2020-01-13 (×2): qty 1

## 2020-01-13 MED ORDER — CALCIUM CARBONATE ANTACID 500 MG PO CHEW
1.0000 | CHEWABLE_TABLET | Freq: Four times a day (QID) | ORAL | Status: DC | PRN
  Administered 2020-01-13 (×2): 200 mg via ORAL
  Filled 2020-01-13 (×2): qty 1

## 2020-01-13 MED ORDER — MONTELUKAST SODIUM 10 MG PO TABS
10.0000 mg | ORAL_TABLET | Freq: Every day | ORAL | Status: DC
  Administered 2020-01-13 – 2020-01-14 (×2): 10 mg via ORAL
  Filled 2020-01-13 (×3): qty 1

## 2020-01-13 MED ORDER — NITROGLYCERIN 0.4 MG SL SUBL: 0.4000 mg | SUBLINGUAL_TABLET | SUBLINGUAL | Status: DC | PRN

## 2020-01-13 MED ORDER — ASPIRIN 81 MG PO CHEW
81.0000 mg | CHEWABLE_TABLET | Freq: Every day | ORAL | Status: DC
  Administered 2020-01-13 – 2020-01-14 (×2): 81 mg via ORAL
  Filled 2020-01-13 (×2): qty 1

## 2020-01-13 MED ORDER — FUROSEMIDE 40 MG PO TABS
40.0000 mg | ORAL_TABLET | Freq: Two times a day (BID) | ORAL | Status: DC
  Administered 2020-01-13 – 2020-01-14 (×3): 40 mg via ORAL
  Filled 2020-01-13 (×2): qty 1
  Filled 2020-01-13: qty 2

## 2020-01-13 MED ORDER — PANTOPRAZOLE SODIUM 40 MG PO TBEC
40.0000 mg | DELAYED_RELEASE_TABLET | Freq: Every day | ORAL | Status: DC
  Administered 2020-01-13 – 2020-01-14 (×2): 40 mg via ORAL
  Filled 2020-01-13 (×2): qty 1

## 2020-01-13 MED ORDER — NITROGLYCERIN 0.4 MG SL SUBL
0.4000 mg | SUBLINGUAL_TABLET | SUBLINGUAL | Status: DC | PRN
  Filled 2020-01-13: qty 1

## 2020-01-13 NOTE — Hospital Course (Addendum)
Admitted 01/13/2020  Allergies: Crestor [rosuvastatin calcium] and Rosuvastatin Pertinent Hx: COPD on 4li O2 at home, CAD s/p CABG 05/2019, ICM (EF 30-35% 09/09/19), AICD, PAF on eliquis s/p [facility] 8/31, and recurrent VT on amiodarone and mexiletine   70 y.o. male p/w chest pain  * Chest pain: Admitted for ACS rule out given elevated Trop and extensive cardiac  Hx. Trop 93>63. Trending Trop. Chest pain free.  Consults:   Meds: ASA, statin, amiodarone, Mexilitine and rest of home medsVTE ppx: Eliquis IVF: none Diet: HH   * Prosper*  []  F/u Trop []  F/u COVID result   O/N events:   Not sure if chest pain like pain with vtaqch Feels similar to pain when he had a prior MI. Does not think ICD fires. Pain improved with nitro and lasted about 1-2 hours after addition nitro and ASA. Has not had similar pain since his bypass. Only able to walk 10 ft. Able to walk further prior to surgery. Feels tachycarida keeps him from walking as it increases pulse and causes icd to fire. Last fired months ago. Never used CPAP because has not found a mask that he can tolerate. Bruising on chest has been present since device was placed. On 2.5 L o2 at home. BP not been as elevated as they have been in the past.

## 2020-01-13 NOTE — Consult Note (Signed)
Cardiology Consultation:   Patient ID: REINHARDT LICAUSI MRN: [id number]; DOB: 06-02-1950  Admit date: 01/13/2020 Date of Consult: 01/13/2020  Primary Care Provider: Anson Fret, MD [facility] Cardiologist: Lesleigh Noe, MD  [facility] Electrophysiologist:  Lewayne Bunting, MD    Patient Profile:   Max Rivas is a 70 y.o. male with a hx of VT/Atrial fib/CAD who is being seen today for the evaluation of chest pain/possible ICD therapies at the request of Dr. Oswaldo Done.  History of Present Illness:   Max Rivas is well known to me. He has a h/o VT and is s/p ICD insertion, CAD, s/p CABG, recent VT ablation, complicated by pseudoaneurysm, and ICD gen change who was in his usual state of health until he developed recurrent chest pain, much like his prior anginal symptoms resulting in his presenting for additional evaluation. He was treated with sublingual NTG and he did not have any documented arrhythmias. He denies receiving an ICD shock.    Past Medical History:  Diagnosis Date  . Arthritis    "elbows, knees" (02/24/2016)  . Bronchitis 2006  . CAD (coronary artery disease)    a. s/p prior MIs - 1995 x 2, 1998; b. s/p prior LCX stenting; c. 04/2019 Cath: LM min irregs, LAD 65p/mi, D1 75, LCX 99ost/p, 57m (ISR), OM2 80, RCA/RPDA mod diff dzs; d. 05/2019 CABG x 3: LIMA->LAD, VG->Diag, VG->OM.  Marland Kitchen COPD (chronic obstructive pulmonary disease) (HCC)    a. Remote tobacco-->on home O2.  Marland Kitchen GERD (gastroesophageal reflux disease)   . 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 05/2019 Echo: EF 40-45%, gr2 DD. Nl RV size/fxn. Mildly dil LA. Mild MR.  . High cholesterol   . Ischemic cardiomyopathy    a. 05/2019 Echo: EF 40-45%.  . Morbid obesity (HCC)   . Myocardial infarction [facility]) ~ 1995 X 2;1998; 2000; 2004  . On home oxygen therapy    "2L w/activity" (02/24/2016)  . PAF (paroxysmal atrial fibrillation) (HCC)    a. CHA2DS2VASc = 4-->eliquis. Also on amio.   . Pulmonary embolism (HCC) 02/24/2016  . Sleep apnea    "have CPAP; can't tolerate it" (02/24/2016)  . Ventricular tachycardia (HCC)    a. 2005 s/p ICD; b. 03/2011 Device upgrade/lead exchange: MDT Protecta XT VR single lead AICD.    Past Surgical History:  Procedure Laterality Date  . CARDIOVERSION N/A 09/11/2019   Procedure: CARDIOVERSION;  Surgeon: Jodelle Red, MD;  Location: [facility] ENDOSCOPY;  Service: Cardiovascular;  Laterality: N/A;  . CLIPPING OF ATRIAL APPENDAGE N/A 05/23/2019   Procedure: CLIPPING OF ATRIAL APPENDAGE with atriclip;  Surgeon: [name], MD;  Location: [facility] OR;  Service: Open Heart Surgery;  Laterality: N/A;  . CORONARY ANGIOPLASTY  1995  . CORONARY ANGIOPLASTY WITH STENT PLACEMENT  ~ 1995 - 2004 X 5   "I've got a total of 5 stents" (02/24/2016)  . CORONARY ARTERY BYPASS GRAFT N/A 05/23/2019   Procedure: CORONARY ARTERY BYPASS GRAFTING (CABG), times three, using right greater saphenous vein and left internal mammary;  Surgeon: [name], MD;  Location: [facility] OR;  Service: Open Heart Surgery;  Laterality: N/A;  [place] only  . 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 INTRAVASCULAR PRESSURE WIRE/FFR STUDY N/A 05/12/2019   Procedure: INTRAVASCULAR PRESSURE WIRE/FFR STUDY;  Surgeon: Lyn Records, MD;  Location: [facility] INVASIVE CV LAB;  Service: Cardiovascular;  Laterality:  N/A;  . LEFT HEART CATH AND CORONARY ANGIOGRAPHY N/A 05/12/2019   Procedure: LEFT HEART CATH AND CORONARY ANGIOGRAPHY;  Surgeon: Lyn Records, MD;  Location: [facility] INVASIVE CV LAB;  Service: Cardiovascular;  Laterality: N/A;  . PACEMAKER GENERATOR CHANGE  03/2011    [facility] [place]  . TEE WITHOUT CARDIOVERSION N/A 05/23/2019   Procedure: TRANSESOPHAGEAL ECHOCARDIOGRAM (TEE);  Surgeon: [name], MD;  Location: [facility] OR;  Service: Open Heart Surgery;  Laterality: N/A;  . TONSILLECTOMY       Home Medications:  Prior to Admission medications    Medication Sig Start Date End Date Taking? Authorizing Provider  acetaminophen (TYLENOL) 325 MG tablet Take 2 tablets (650 mg total) by mouth every 6 (six) hours as needed for mild pain (or Fever >/= 101). 03/05/16   Alison Murray, MD  amiodarone (PACERONE) 400 MG tablet Take 1 tablet (400 mg total) by mouth 3 (three) times daily. 12/07/19   Filbert Schilder, NP  apixaban (ELIQUIS) 5 MG TABS tablet Take 5 mg by mouth 2 (two) times daily.    [provider]  aspirin 81 MG chewable tablet Chew 1 tablet (81 mg total) by mouth daily. 05/16/19   Kroeger, Ovidio Kin., PA-C  atorvastatin (LIPITOR) 80 MG tablet Take 1 tablet (80 mg total) by mouth daily. Patient taking differently: Take 80 mg by mouth every evening. 05/15/19   Kroeger, Ovidio Kin., PA-C  carboxymethylcellulose (REFRESH PLUS) 0.5 % SOLN Place 1 drop into both eyes at bedtime.    [provider]  Cholecalciferol (VITAMIN D3) 50 MCG (2000 UT) TABS Take 2,000 Units by mouth daily.    [provider]  furosemide (LASIX) 40 MG tablet Take 40 mg by mouth 2 (two) times daily.     [provider]  gabapentin (NEURONTIN) 100 MG capsule Take 2 capsules (200 mg total) by mouth 2 (two) times daily. 06/01/19   Barrett, Erin R, PA-C  levalbuterol (XOPENEX HFA) 45 MCG/ACT inhaler Inhale 2 puffs into the lungs every 6 (six) hours as needed for wheezing or shortness of breath.    [provider]  levothyroxine (SYNTHROID) 75 MCG tablet Take 75 mcg by mouth daily before breakfast.    [provider]  metoprolol succinate (TOPROL-XL) 25 MG 24 hr tablet Take 12.5 mg by mouth daily.    [provider]  mexiletine (MEXITIL) 150 MG capsule Take 2 capsules (300 mg total) by mouth 2 (two) times daily. 12/07/19   Filbert Schilder, NP  mometasone [facility]) 220 MCG/INH inhaler Inhale 2 puffs into the lungs 2 (two) times daily.    [provider]  montelukast (SINGULAIR) 10 MG tablet Take 10 mg by mouth  daily.     [provider]  Multiple Vitamin (MULTIVITAMIN WITH MINERALS) TABS tablet Take 1 tablet by mouth daily.    [provider]  nitroGLYCERIN (NITROSTAT) 0.4 MG SL tablet Place 0.4 mg under the tongue every 5 (five) minutes as needed for chest pain.     [provider]  omega-3 acid ethyl esters (LOVAZA) 1 g capsule Take 1 capsule (1 g total) by mouth 2 (two) times daily. 03/05/16   Alison Murray, MD  omeprazole (PRILOSEC) 20 MG capsule Take 20 mg by mouth 2 (two) times daily before a meal.    [provider]  Propylene Glycol (SYSTANE BALANCE) 0.6 % SOLN Place 1 drop into both eyes See admin instructions. Place 1 drop into each eye four times a day  and apply a warm compress for 10 minutes afterwards     [provider]  Tiotropium Bromide-Olodaterol 2.5-2.5 MCG/ACT AERS Inhale 2 puffs into the lungs daily.     [provider]    Inpatient Medications: Scheduled Meds: . amiodarone  400 mg Oral TID  . apixaban  5 mg Oral BID  . aspirin  81 mg Oral Daily  . atorvastatin  80 mg Oral Daily  . budesonide  0.5 mg Inhalation BID  . cholecalciferol  2,000 Units Oral Daily  . furosemide  40 mg Oral BID  . levothyroxine  75 mcg Oral QAC breakfast  . metoprolol succinate  12.5 mg Oral Daily  . mexiletine  300 mg Oral BID  . montelukast  10 mg Oral Daily  . omega-3 acid ethyl esters  1 g Oral BID  . pantoprazole  40 mg Oral Daily  . polyvinyl alcohol  1 drop Both Eyes QHS  . potassium chloride  40 mEq Oral Once  . umeclidinium-vilanterol  1 puff Inhalation Daily   Continuous Infusions:  PRN Meds: acetaminophen **OR** acetaminophen, calcium carbonate, levalbuterol, nitroGLYCERIN, polyethylene glycol  Allergies:    Allergies  Allergen Reactions  . Crestor [Rosuvastatin Calcium] Other (See Comments)    Back spasms, but can tolerate it at a low dose now  . Rosuvastatin Other (See Comments)    Back spasms, but can tolerate it at a  low dose now    Social History:   Social History   Socioeconomic History  . Marital status: Divorced    Spouse name: Not on file  . Number of children: Not on file  . Years of education: Not on file  . Highest education level: Not on file  Occupational History  . Not on file  Tobacco Use  . Smoking status: Former Smoker    Packs/day: 3.00    Years: 35.00    Pack years: 105.00    Types: Cigarettes    Quit date: 05/22/2019    Years since quitting: 0.6  . Smokeless tobacco: Former Systems developer    Quit date: 05/21/2017  Vaping Use  . Vaping Use: Never used  Substance and Sexual Activity  . Alcohol use: Not Currently  . Drug use: Not Currently    Types: Cocaine    Comment: none since 1995  . Sexual activity: Yes  Other Topics Concern  . Not on file  Social History Narrative  . Not on file   Social Determinants of Health   Financial Resource Strain: Not on file  Food Insecurity: Not on file  Transportation Needs: Not on file  Physical Activity: Not on file  Stress: Not on file  Social Connections: Not on file  Intimate Partner Violence: Not on file    Family History:    Family History  Problem Relation Age of Onset  . Heart attack Mother   . Heart attack Father   . Heart attack Sister 64     ROS:  Please see the history of present illness.   All other ROS reviewed and negative.     Physical Exam/Data:   Vitals:   01/13/20 0900 01/13/20 1100 01/13/20 1135 01/13/20 1200  BP: (!) 134/91 (!) 107/59 100/62 (!) 107/59  Pulse: 72 61 61 61  Resp: 18 20 16    Temp:   97.7 F (36.5 C)   TempSrc:   Oral   SpO2: 97% 98% 97%     Intake/Output Summary (Last 24 hours) at 01/13/2020 1506 Last data filed  at 01/13/2020 1200 Gross per 24 hour  Intake 720 ml  Output 1625 ml  Net -905 ml   Last 3 Weights 01/01/2020 01/01/2020 12/31/2019  Weight (lbs) 204 lb 12.9 oz 204 lb 12.9 oz 208 lb 12.4 oz  Weight (kg) 92.9 kg 92.9 kg 94.7 kg     There is no height or weight on file to  calculate BMI.  General:  Obese, Well nourished, well developed, in no acute distress HEENT: normal Lymph: no adenopathy Neck: no JVD Endocrine:  No thryomegaly Vascular: No carotid bruits; FA pulses 2+ bilaterally without bruits  Cardiac:  normal S1, S2; RRR; no murmur  Lungs:  clear to auscultation bilaterally, no wheezing, rhonchi or rales  Abd: soft, nontender, no hepatomegaly  Ext: no edema Musculoskeletal:  No deformities, BUE and BLE strength normal and equal Skin: warm and dry  Neuro:  CNs 2-12 intact, no focal abnormalities noted Psych:  Normal affect   EKG:  The EKG was personally reviewed and demonstrates:  Sinus bradycardia Telemetry:  Telemetry was personally reviewed and demonstrates:  Sinus brady  Relevant CV Studies: none  Laboratory Data:  High Sensitivity Troponin:   Recent Labs  Lab 12/31/19 1407 12/31/19 1646 01/13/20 0237 01/13/20 0422  TROPONINIHS 10 11 93* 65*     Chemistry Recent Labs  Lab 01/13/20 0237  NA 141  K 3.5  CL 98  CO2 30  GLUCOSE 93  BUN 11  CREATININE 1.50*  CALCIUM 9.1  GFRNONAA 51*  ANIONGAP 13    No results for input(s): PROT, ALBUMIN, AST, ALT, ALKPHOS, BILITOT in the last 168 hours. Hematology Recent Labs  Lab 01/13/20 0237  WBC 7.3  RBC 4.66  HGB 13.9  HCT 43.5  MCV 93.3  MCH 29.8  MCHC 32.0  RDW 14.5  PLT 169   BNP Recent Labs  Lab 01/13/20 0237  BNP 245.1*    DDimer No results for input(s): DDIMER in the last 168 hours.   Radiology/Studies:  DG Chest 2 View  Result Date: 01/13/2020 CLINICAL DATA:  Chest pain EXAM: CHEST - 2 VIEW COMPARISON:  December 31, 2019 FINDINGS: The heart size and mediastinal contours are mildly enlarged. Aortic knob calcifications are seen. A left-sided pacemaker seen with the lead tips in the right atrium right ventricle. Small bilateral pleural effusions are seen. Diffusely increased interstitial markings seen at both lung bases as on prior exam. No acute osseous  abnormality. IMPRESSION: Small bilateral pleural effusions. Findings which could be suggestive of interstitial edema. Electronically Signed   By: Jonna Clark M.D.   On: 01/13/2020 02:47     Assessment and Plan:   1. Chest pressure/CAD - his troponins are not impressive. He is s/p CABG 5/21. I would have him ambulate in the halls. If no symptoms then he could go home. 2. H/o VT - none on tele. I will ask [employer] to interrogate his device. He is on high dose amiodarone. We will reduce to 400 mg daily. Continue mexitil. Not sure how much benefit he got from the VT ablation.  3. Chronic systolic heart failure - His EF is 30% by echo. Continue lasix. I suspect that he is close to euvolemic.       TIMI Risk Score for Unstable Angina or Non-ST Elevation MI:   The patient's TIMI risk score is  , which indicates a  % risk of all cause mortality, new or recurrent myocardial infarction or need for urgent revascularization in the next 14 days.  [place]  [place] Heart Association (NYHA) Functional Class NYHA Class III   CHA2DS2-VASc Score =    This indicates a  % annual risk of stroke. The patient's score is based upon:           For questions or updates, please contact [facility] Please consult [email or web address] for contact info under    Signed, Lewayne Bunting, MD  01/13/2020 3:06 PM

## 2020-01-13 NOTE — Progress Notes (Addendum)
Subjective:   Hospital day: 1  Overnight event: NAEOV  This morning, patient not sure if chest pain is like pain he had with his vtach a few months ago. The pain felt similar to pain when he had a prior MI. Does not think ICD fires. Pain improved with nitro and resolved after addition nitro and 4 baby ASA. The pain lasted about 1-2 hrs. Has not had similar pain since his bypass in May 2021. Only able to walk 10 ft. Able to walk further prior to surgery. Feels tachycardia keeps him from walking as it increases pulse and causes ICD to fire. Last fired months ago. Never used CPAP because has not found a mask that he can tolerate. Bruising on chest has been present since device was placed. On 2.5 L O2 at home. BP not been as elevated as they have been in the past. States his CP is resolved, SOB has improved.   Objective:  Vital signs in last 24 hours: Vitals:   01/13/20 0353 01/13/20 0409 01/13/20 0430  BP: 130/70  (!) 115/57  Pulse: (!) 56  (!) 55  Resp: 17  12  Temp:  98 F (36.7 C)   TempSrc:  Oral   SpO2: 100%  100%    Physical Exam  General: Pleasant, well-appearing man laying in bed. No acute distress. Head: Normocephalic. Atraumatic. CV: RRR. No murmurs, rubs, or gallops. No LE edema. Mild bruises on site of ICD placement on L upper chest wall.  Pulmonary: Lungs CTAB. Normal effort. No wheezing or rales. Abdominal: Soft, nontender, nondistended. Normal bowel sounds. Extremities: Palpable pulses. Normal ROM. Skin: Warm and dry. No obvious rash or lesions. Neuro: A&Ox3. Moves all extremities. Normal sensation. No focal deficit. Psych: Normal mood and affect  Assessment/Plan: ANTWANE GROSE is a 70 y.o. male w/ PMH of COPD (GOLD 4) on 4L supplemental O2 at home, CAD s/p CABG 05/2019, chronic systolic heart failure (EF 30-35% 09/09/19), AICD, paroxysmal atrial fibrillation on eliquis s/p [facility] 8/31, and recurrent VT on amiodarone and mexiletine admitted for anginal chest pain. Chest  pain now resolved. Pending cardiology evaluation.  Principal Problem:   Chest pain Active Problems:   DCM (dilated cardiomyopathy) (HCC)   Chronic atrial fibrillation   CKD (chronic kidney disease)   OSA (obstructive sleep apnea)   VT (ventricular tachycardia) (HCC)  Chest Pain CAD s/p CABG (05/2019) Recurrent VT Patient presenting with anginal chest pain overnight in setting of extensive cardiac history and recent admission for similar presentation. Chest pain resolved. Troponins peaked at 93. Patient reports this chest pain feels more like his previous heart attacks and than his VT attack recently. Plan to consult cardiology to interrogate ICD to rule out arrhythmia and further evaluation of recurrent chest pain. --Cardiology consult pending --Nitro as needed --Continue ASA 80 mg daily --Continue Lipitor 80 mg daily --Continue telemetry   CKD 3a On arrival, sCr 1.5 (1.1-1.2 a month ago).  Patient continues to endorse dyspnea on exertion. Not overly fluid overloaded on exam but will likely need further diuresis. Continue diuresis and follow-up morning BMP.  --Follow-up BMP   Chronic systolic heart failure w/ AICD TTE in August 2021 revealed EF 30-35%, global hypokinesis. Patient is euvolemic on exam. Reports dyspnea on exertion after walking about 10 ft at home. States he is adherent to his diuretic regimen. Continue to monitor fluid status daily.  --Continue Lasix 40 mg twice daily  --Continue metoprolol 12.5 mg daily --Strict I&O's --Daily weights   Paroxysmal Afib  Recurrent VT Patient reports he has had some intermittent palpitations since [facility] in August 2021. Heart rate in the 50s-70s. Patient doesn't think his ICD went off this time. States it only goes off when his HR is 150 or above. Continue to monitor on tele. Additionally on aspirin --Continue Eliquis 5 mg BID --Continue amiodarone 400 mg TID --Continue Mexitine 300 mg BID --Patient's Electrophysiologists referring him  to another cardiologist for further evaluation of his recurrent V. tach.   COPD GOLD 4 OSA Patient on 2.5 L of home oxygen. Patient endorses dyspnea on exertion. States he has CPAP at home but the mask does not fit so he has not been using it. Encourage patient to talk to PCP getting on CPAP at night. Patient on 3 L [place] O2 sats above 97%. Plan to wean off as tolerated. --Continue Ellipta, Pulmicort, albuterol --Wean off O2 as tolerated --Pulse ox  Hypothyroidism TSH previously 2.16 three weeks ago. --Continue home Synthroid 75 mcg daily  Diet: HH IVF: None VTE: Eliquis 5 mg BID CODE: Full Code  Prior to Admission Living Arrangement: Home Anticipated Discharge Location: Home Barriers to Discharge: Medical work up Dispo: Anticipated discharge in approximately 1-2 day(s).   Signed: Steffanie Rainwater, MD 01/13/2020, 8:00 AM  Pager: [phone] Internal Medicine Teaching Service After 5pm on weekdays and 1pm on weekends: On Call pager: [phone]

## 2020-01-13 NOTE — ED Notes (Signed)
Attempted report x 2 

## 2020-01-13 NOTE — H&P (Addendum)
NAME:  Max Rivas, MRN:  [id number], DOB:  12-22-1950, LOS: 0 ADMISSION DATE:  01/13/2020, Primary: Max Mellow, MD  CHIEF COMPLAINT:  Chest pain  Medical Service: Internal Medicine Teaching Service         Attending Physician: Dr. Evette Doffing, Mallie Mussel, *    First Contact: Dr. Coy Saunas Pager: [phone]  Second Contact: Dr. Marianna Payment Pager: [phone]       After Hours (After 5p/  First Contact Pager: [phone]  weekends / holidays): Second Contact Pager: [phone]   HISTORY OF PRESENT ILLNESS  [name] is 70 yo male with COPD (GOLD 4) on 4L supplemental O2 at home, CAD s/p CABG 07/5100, chronic systolic heart failure (EF 30-35% 09/09/19) w/ AICD, paroxysmal atrial fibrillation on eliquis s/p [facility] 8/31, and recurrent VT on amiodarone and mexiletine who presents to [facility] with chest pain. He was in his usual state of health until he woke up from his sleep at 11PM. He mentions the chest pain felt like "someone sitting on his chest" and described 8/10 pain. States this was associated with diaphoresis, nausea, and dyspnea. Denies fevers, chills, dizziness, blurry vision, recent palpitations, cough, vomiting, abdominal pain, urinary symptoms, leg swelling, leg pain. He does mention bilateral leg numbness during the episode. [name] subsequently called 911 given his concerning symptoms. While talking to operator, he took his home sublingual nitroglycerin which decreased the pain and associated symptoms. When EMS arrived, he was given three tablets of aspirin. He reports he had very minimal pain by the time he arrived in the ED.   Of note, patient was recently hospitalized last week for similar presentation. He was discharged after overnight stay and instructed to go to cardiologist appointment later that afternoon. He does mention he has another upcoming cardiologist appointment this Thursday. He does report compliance with his medications as prescribed.  PCP: Max Mellow, MD  ED COURSE   On arrival to ED, patient afebrile and hemodynamically stable. Troponin 94. EKG unchanged from last admission. Chest pain resolved s/p aspirin and SL nitro. EDP talked to cardiology who recommended admission by IMTS.  PAST MEDICAL HISTORY  He,  has a past medical history of Arthritis, Bronchitis (2006), CAD (coronary artery disease), COPD (chronic obstructive pulmonary disease) ([place]), GERD (gastroesophageal reflux disease), HFmrEF (heart failure with mid-range ejection fraction) ([place]), High cholesterol, Ischemic cardiomyopathy, Morbid obesity ([place]), Myocardial infarction ([place]) (~ 1995 X 2;1998; 2000; 2004), On home oxygen therapy, PAF (paroxysmal atrial fibrillation) ([place]), Pulmonary embolism ([place]) (02/24/2016), Sleep apnea, and Ventricular tachycardia ([place]).   HOME MEDICATIONS   Prior to Admission medications   Medication Sig Start Date End Date Taking? Authorizing Provider  acetaminophen (TYLENOL) 325 MG tablet Take 2 tablets (650 mg total) by mouth every 6 (six) hours as needed for mild pain (or Fever >/= 101). 03/05/16   Robbie Lis, MD  amiodarone (PACERONE) 400 MG tablet Take 1 tablet (400 mg total) by mouth 3 (three) times daily. 12/07/19   Tommie Raymond, NP  apixaban (ELIQUIS) 5 MG TABS tablet Take 5 mg by mouth 2 (two) times daily.    [provider]  aspirin 81 MG chewable tablet Chew 1 tablet (81 mg total) by mouth daily. 05/16/19   Kroeger, Lorelee Cover., PA-C  atorvastatin (LIPITOR) 80 MG tablet Take 1 tablet (80 mg total) by mouth daily. Patient taking differently: Take 80 mg by mouth every evening. 05/15/19   Kroeger, Lorelee Cover., PA-C  carboxymethylcellulose (REFRESH PLUS) 0.5 % SOLN Place 1 drop into  both eyes at bedtime.    [provider]  Cholecalciferol (VITAMIN D3) 50 MCG (2000 UT) TABS Take 2,000 Units by mouth daily.    [provider]  furosemide (LASIX) 40 MG tablet Take 40 mg by mouth 2 (two) times daily.     [provider]  gabapentin  (NEURONTIN) 100 MG capsule Take 2 capsules (200 mg total) by mouth 2 (two) times daily. 06/01/19   Barrett, Erin R, PA-C  levalbuterol (XOPENEX HFA) 45 MCG/ACT inhaler Inhale 2 puffs into the lungs every 6 (six) hours as needed for wheezing or shortness of breath.    [provider]  levothyroxine (SYNTHROID) 75 MCG tablet Take 75 mcg by mouth daily before breakfast.    [provider]  metoprolol succinate (TOPROL-XL) 25 MG 24 hr tablet Take 12.5 mg by mouth daily.    [provider]  mexiletine (MEXITIL) 150 MG capsule Take 2 capsules (300 mg total) by mouth 2 (two) times daily. 12/07/19   Filbert Schilder, NP  mometasone [facility]) 220 MCG/INH inhaler Inhale 2 puffs into the lungs 2 (two) times daily.    [provider]  montelukast (SINGULAIR) 10 MG tablet Take 10 mg by mouth daily.     [provider]  Multiple Vitamin (MULTIVITAMIN WITH MINERALS) TABS tablet Take 1 tablet by mouth daily.    [provider]  nitroGLYCERIN (NITROSTAT) 0.4 MG SL tablet Place 0.4 mg under the tongue every 5 (five) minutes as needed for chest pain.     [provider]  omega-3 acid ethyl esters (LOVAZA) 1 g capsule Take 1 capsule (1 g total) by mouth 2 (two) times daily. 03/05/16   Alison Murray, MD  omeprazole (PRILOSEC) 20 MG capsule Take 20 mg by mouth 2 (two) times daily before a meal.    [provider]  Propylene Glycol (SYSTANE BALANCE) 0.6 % SOLN Place 1 drop into both eyes See admin instructions. Place 1 drop into each eye four times a day and apply a warm compress for 10 minutes afterwards     [provider]  Tiotropium Bromide-Olodaterol 2.5-2.5 MCG/ACT AERS Inhale 2 puffs into the lungs daily.     [provider]    ALLERGIES   Allergies as of 01/13/2020 - Review Complete 01/13/2020  Allergen Reaction Noted  . Crestor [rosuvastatin calcium] Other (See Comments) 01/15/2016  . Rosuvastatin Other (See Comments)  01/15/2016    SOCIAL HISTORY  Mr. cid agena in [place]. Has a roommate. He used to smoke cigarette but stopped that after having bypass surgery. No alcohol use. No illicit drug use.   FAMILY HISTORY  His family history includes Heart attack in his father and mother; Heart attack (age of onset: 15) in his sister.   REVIEW OF SYSTEMS  ROS per history of present illness.  PHYSICAL EXAMINATION  Blood pressure (!) 115/57, pulse (!) 55, temperature 98 F (36.7 C), temperature source Oral, resp. rate 12, SpO2 100 %.    GENERAL: Resting comfortably in bed, no acute distress. On 4L supplemental O2 HENT: Normoactive, atraumatic. EYES: Vision grossly in tact. Normal lids, eyes. CV: Regular rate, rhythm. No murmurs, rubs, gallops appreciated. Distal pulses 1+ on left, 2+ on right. PULM: No increased work of breathing or use of accessory muscles. No wheezing, rales, rhonchi appreciated. ABD:  Soft, non-tender, non-distended. Normoactive bowel sounds. MSK: Normal bulk, tone. No pitting edema bilaterally. SKIN: Normal skin turgor. No wounds or ulcerations appreciated. NEURO: Awake, alert, oriented x4.  Moving extremities appropriately. Sensation in tact bilateral lower extremities. PSYCH: Pleasant, normal mood, speech.  SIGNIFICANT DIAGNOSTIC TESTS  ECG: Normal sinus rhythm. Borderline QT prolongation. ST changes in precordial leads, no changes from previous study.  I personally reviewed patient's ECG with my interpretation as above.  CXR: Cardiomegaly. Increased interstitial markings. Improved left pleural effusion from previous study last week.  I personally reviewed patient's CXR with my interpretation as above.  LABS   CBC Latest Ref Rng & Units 01/13/2020 01/01/2020 12/31/2019  WBC 4.0 - 10.5 K/uL 7.3 8.5 5.5  Hemoglobin 13.0 - 17.0 g/dL 27.0 35.0 12.8(L)  Hematocrit 39.0 - 52.0 % 43.5 40.4 40.7  Platelets 150 - 400 K/uL 169 163 175   BMP Latest Ref Rng & Units 01/13/2020  01/01/2020 12/31/2019  Glucose 70 - 99 mg/dL 93 92 093(G)  BUN 8 - 23 mg/dL 11 9 8   Creatinine 0.61 - 1.24 mg/dL ) 1.82(X) 9.37(J  BUN/Creat Ratio 10 - 24 - - -  Sodium 135 - 145 mmol/L 141 141 138  Potassium 3.5 - 5.1 mmol/L 3.5 4.1 3.9  Chloride 98 - 111 mmol/L 98 102 103  CO2 22 - 32 mmol/L 30 25 28   Calcium 8.9 - 10.3 mg/dL 9.1 8.9 6.96)   CONSULTS  none  ASSESSMENT  Mr. Jaeson Molstad is 70 yo male with COPD (GOLD 4) on 4L supplemental O2 at home, CAD s/p CABG 05/2019, chronic systolic heart failure (EF 30-35% 09/09/19), AICD, paroxysmal atrial fibrillation on eliquis s/p [facility] 8/31, and recurrent VT on amiodarone and mexiletine admitted 1/1 for anginal chest pain.  PLAN  Active Problems:   Chest pain  #Chest Pain #CAD s/p CABG (05/2019) #Recurrent VT Patient presenting with anginal chest pain overnight in setting of extensive cardiac history and recent admission for similar presentation. His chest pain resolved after sublingual nitroglycerin and aspirin. Upon arrival, patient afebrile and hemodynamically stable. Initial troponin 94, ECG without acute ST changes compared to last study. Further anticoagulation was held in the ED as patient took home dose of Eliquis last night. In the room patient is currently asymptomatic and chest pain is not reproducible by palpitation. Plan to trend troponin and can consider cardiology consult given his high risk symptoms. Possible ischemic chest pain vs ventricular tachycardia. - Trend troponin - Cards consult for interrogation - SL nitro PRN - ASA 81mg  qd - Lipitor 80mg  qd - Tele  #Acute kidney injury On arrival, sCr 1.5 (1.1-1.2 a month ago). Unclear etiology of injury, as patient seems euvolemic on exam without other obvious cause. Will continue to trend BMP, can consider further work-up with renal ultrasound and urine lytes if does not improve. -BMP daily  #Chronic systolic heart failure w/ AICD Patient on arrival appears euvolemic,  sating well on his home supplemental oxygen. BNP 240. Previous TTE in August 2021 revealed EF 30-35%, global hypokinesis. Will continue home medications and assess volume status daily. - C/w home furosemide 40mg  BID, Toprol-XL 12.5mg  qd - F/u BMP, CBC  #Paroxysmal atrial fibrillation #Recurrent ventricular tachycardia Patient reports he has had some intermittent palpitations since [facility] in August 2021, but no recent changes. No ECG changes on arrival. - C/w home Eliquis 5mg  BID, amiodarone 400mg  TID, mexitine 300mg  BID  #COPD GOLD 4 Patient reports some dyspnea that occurred with episode of chest pain, but otherwise no changes. Denies cough, sputum production, fevers, chills. Will continue on home medications at this time. - Ellipta 1 puff qd - Pulmicort 0.5mg  BID - Levalbuterol nebs q6  PRN  #Obstructive sleep apnea Patient states he previously wore CPAP but has not been able to, as he cannot find a mask that fits him. Recommend further discussion with PCP.  #Hypothyroidism TSH previously 2.16 three weeks ago. Continue home med. - c/w home levothyroxine qd  BEST PRACTICE  DIET: HH IVF: n/a DVT PPX: Eliquis 5mg  BID BOWEL: Miralax CODE: FULL FAM COM: Patient mentions that in case of decompensation or other acute adverse event, that he prefers that his roommate, , is notified.  DISPO: Admit patient to Observation with expected length of stay less than 2 midnights.  Ferdinand Cava, MD Internal Medicine Resident PGY-1 PAGER: [phone] 01/13/20 4:45 AM  If after hours (below), please contact on-call pager: [phone] 5PM-7AM Monday-Friday 1PM-7AM Saturday-Sunday

## 2020-01-13 NOTE — Plan of Care (Signed)
°  Problem: Education: °Goal: Knowledge of General Education information will improve °Description: Including pain rating scale, medication(s)/side effects and non-pharmacologic comfort measures °Outcome: Progressing °  °Problem: Clinical Measurements: °Goal: Ability to maintain clinical measurements within normal limits will improve °Outcome: Progressing °  °Problem: Nutrition: °Goal: Adequate nutrition will be maintained °Outcome: Progressing °  °

## 2020-01-13 NOTE — ED Triage Notes (Signed)
PT arrives via EMS from home with sudden onset of substernal CP, nonradiating, dull that woke him from a sleep approx 1 hour PTA. Pt reports nausea and diaphoresis with the pain.  Pain initially 8/10 decreased to 4/10 after 1 SL nitro. 324mg  ASA PTA. 20g RAC. EKG showed afib with LBBB. Pt with CABG in 2021.

## 2020-01-13 NOTE — ED Provider Notes (Signed)
TIME SEEN: 3:54 AM  CHIEF COMPLAINT: Chest pain  HPI: Patient is a 70 year old male with history of HTN, HLD, COPD, CAD s/p CABG in May 2021, a fib on Eliquis for ventricular tachycardia status post AICD who presents to the emergency department with diffuse anterior chest pressure that woke him from sleep at 11 PM.  Describes it as someone sitting on his chest.  States he felt clammy and was diaphoretic, nauseated and short of breath.  Took 1 nitroglycerin and 4 aspirin with EMS reports pain is almost completely gone.  He describes this as feeling similar to his previous anginal equivalent.  No lower extremity swelling or pain.  He denies history of PE or DVT although it appears he has had a PE based on his chart review.  States he has been compliant with his Eliquis.  No fevers, cough, loss of taste or smell.  Was just admitted to the internal medicine service for palpitations.  Echo 09/09/19:  IMPRESSIONS    1. Left ventricular ejection fraction, by estimation, is 30 to 35%. The  left ventricle has moderately decreased function. The left ventricle  demonstrates global hypokinesis. The left ventricular internal cavity size  was moderately dilated. Left  ventricular diastolic parameters are consistent with Grade I diastolic  dysfunction (impaired relaxation).  2. Right ventricular systolic function is moderately reduced. The right  ventricular size is mildly enlarged. There is moderately elevated  pulmonary artery systolic pressure.  3. Left atrial size was severely dilated.  4. Right atrial size was mildly dilated.  5. The mitral valve is normal in structure. Mild mitral valve  regurgitation. No evidence of mitral stenosis.  6. The aortic valve is normal in structure. Aortic valve regurgitation is  not visualized. No aortic stenosis is present.  7. The inferior vena cava is dilated in size with <50% respiratory  variability, suggesting right atrial pressure of 15 mmHg.   ROS: See  HPI Constitutional: no fever  Eyes: no drainage  ENT: no runny nose   Cardiovascular:  no chest pain  Resp: no SOB  GI: no vomiting GU: no dysuria Integumentary: no rash  Allergy: no hives  Musculoskeletal: no leg swelling  Neurological: no slurred speech ROS otherwise negative  PAST MEDICAL HISTORY/PAST SURGICAL HISTORY:  Past Medical History:  Diagnosis Date  . Arthritis    "elbows, knees" (02/24/2016)  . Bronchitis 2006  . CAD (coronary artery disease)    a. s/p prior MIs - 1995 x 2, 1998; b. s/p prior LCX stenting; c. 04/2019 Cath: LM min irregs, LAD 65p/mi, D1 75, LCX 99ost/p, 61m (ISR), OM2 80, RCA/RPDA mod diff dzs; d. 05/2019 CABG x 3: LIMA->LAD, VG->Diag, VG->OM.  Marland Kitchen COPD (chronic obstructive pulmonary disease) ([facility])    a. Remote tobacco-->on home O2.  Marland Kitchen GERD (gastroesophageal reflux disease)   . 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 05/2019 Echo: EF 40-45%, gr2 DD. Nl RV size/fxn. Mildly dil LA. Mild MR.  . High cholesterol   . Ischemic cardiomyopathy    a. 05/2019 Echo: EF 40-45%.  . Morbid obesity ([facility])   . Myocardial infarction [facility]) ~ 1995 X 2;1998; 2000; 2004  . On home oxygen therapy    "2L w/activity" (02/24/2016)  . PAF (paroxysmal atrial fibrillation) ([facility])    a. CHA2DS2VASc = 4-->eliquis. Also on amio.  . Pulmonary embolism ([facility]) 02/24/2016  . Sleep apnea    "have CPAP; can't tolerate it" (02/24/2016)  . Ventricular tachycardia ([facility])  a. 2005 s/p ICD; b. 03/2011 Device upgrade/lead exchange: MDT Protecta XT VR single lead AICD.    MEDICATIONS:  Prior to Admission medications   Medication Sig Start Date End Date Taking? Authorizing Provider  acetaminophen (TYLENOL) 325 MG tablet Take 2 tablets (650 mg total) by mouth every 6 (six) hours as needed for mild pain (or Fever >/= 101). 03/05/16   Alison Murray, MD  amiodarone (PACERONE) 400 MG tablet Take 1 tablet (400 mg total) by mouth 3 (three) times daily. 12/07/19   Filbert Schilder, NP  apixaban (ELIQUIS) 5 MG TABS tablet Take 5 mg by mouth 2 (two) times daily.    [provider]  aspirin 81 MG chewable tablet Chew 1 tablet (81 mg total) by mouth daily. 05/16/19   Kroeger, Ovidio Kin., PA-C  atorvastatin (LIPITOR) 80 MG tablet Take 1 tablet (80 mg total) by mouth daily. Patient taking differently: Take 80 mg by mouth every evening. 05/15/19   Kroeger, Ovidio Kin., PA-C  carboxymethylcellulose (REFRESH PLUS) 0.5 % SOLN Place 1 drop into both eyes at bedtime.    [provider]  Cholecalciferol (VITAMIN D3) 50 MCG (2000 UT) TABS Take 2,000 Units by mouth daily.    [provider]  furosemide (LASIX) 40 MG tablet Take 40 mg by mouth 2 (two) times daily.     [provider]  gabapentin (NEURONTIN) 100 MG capsule Take 2 capsules (200 mg total) by mouth 2 (two) times daily. 06/01/19   Barrett, Erin R, PA-C  levalbuterol (XOPENEX HFA) 45 MCG/ACT inhaler Inhale 2 puffs into the lungs every 6 (six) hours as needed for wheezing or shortness of breath.    [provider]  levothyroxine (SYNTHROID) 75 MCG tablet Take 75 mcg by mouth daily before breakfast.    [provider]  metoprolol succinate (TOPROL-XL) 25 MG 24 hr tablet Take 12.5 mg by mouth daily.    [provider]  mexiletine (MEXITIL) 150 MG capsule Take 2 capsules (300 mg total) by mouth 2 (two) times daily. 12/07/19   Filbert Schilder, NP  mometasone [facility]) 220 MCG/INH inhaler Inhale 2 puffs into the lungs 2 (two) times daily.    [provider]  montelukast (SINGULAIR) 10 MG tablet Take 10 mg by mouth daily.     [provider]  Multiple Vitamin (MULTIVITAMIN WITH MINERALS) TABS tablet Take 1 tablet by mouth daily.    [provider]  nitroGLYCERIN (NITROSTAT) 0.4 MG SL tablet Place 0.4 mg under the tongue every 5 (five) minutes as needed for chest pain.     [provider]  omega-3 acid ethyl esters (LOVAZA) 1 g capsule Take  1 capsule (1 g total) by mouth 2 (two) times daily. 03/05/16   Alison Murray, MD  omeprazole (PRILOSEC) 20 MG capsule Take 20 mg by mouth 2 (two) times daily before a meal.    [provider]  Propylene Glycol (SYSTANE BALANCE) 0.6 % SOLN Place 1 drop into both eyes See admin instructions. Place 1 drop into each eye four times a day and apply a warm compress for 10 minutes afterwards     [provider]  Tiotropium Bromide-Olodaterol 2.5-2.5 MCG/ACT AERS Inhale 2 puffs into the lungs daily.     [provider]    ALLERGIES:  Allergies  Allergen Reactions  . Crestor [Rosuvastatin Calcium] Other (See Comments)    Back spasms, but can tolerate it at a low dose now  . Rosuvastatin Other (See  Comments)    Back spasms, but can tolerate it at a low dose now    SOCIAL HISTORY:  Social History   Tobacco Use  . Smoking status: Former Smoker    Packs/day: 3.00    Years: 35.00    Pack years: 105.00    Types: Cigarettes    Quit date: 05/22/2019    Years since quitting: 0.6  . Smokeless tobacco: Former Systems developer    Quit date: 05/21/2017  Substance Use Topics  . Alcohol use: Not Currently    FAMILY HISTORY: Family History  Problem Relation Age of Onset  . Heart attack Mother   . Heart attack Father   . Heart attack Sister 36    EXAM: BP (!) 115/57   Pulse (!) 55   Temp 98 F (36.7 C) (Oral)   Resp 12   SpO2 100%  CONSTITUTIONAL: Alert and oriented and responds appropriately to questions. Well-appearing; well-nourished HEAD: Normocephalic EYES: Conjunctivae clear, pupils appear equal, EOM appear intact ENT: normal nose; moist mucous membranes NECK: Supple, normal ROM CARD: RRR; S1 and S2 appreciated; no murmurs, no clicks, no rubs, no gallops RESP: Normal chest excursion without splinting or tachypnea; breath sounds clear and equal bilaterally; no wheezes, no rhonchi, no rales, no hypoxia or respiratory distress, speaking full sentences ABD/GI: Normal bowel  sounds; non-distended; soft, non-tender, no rebound, no guarding, no peritoneal signs, no hepatosplenomegaly BACK:  The back appears normal EXT: Normal ROM in all joints; no deformity noted, no edema; no cyanosis, no calf tenderness or calf swelling SKIN: Normal color for age and race; warm; no rash on exposed skin NEURO: Moves all extremities equally PSYCH: The patient's mood and manner are appropriate.   MEDICAL DECISION MAKING: Patient here with complaints of chest pain.  I was completely chest pain-free after 1 nitroglycerin.  EKG shows no new ischemic change.  His first troponin is mildly elevated at 93.  Second pending.  Will give another nitroglycerin tablet to get patient chest pain-free.  I feel he will need to be admitted to the hospital.  His troponin recently during his admission for palpitations was normal.  We will also interrogate patient's pacemaker.  Chest x-ray shows signs of volume overload.  BNP pending.  He does not appear to have any peripheral edema on exam.  No hypoxia.  Will hold heparin given last dose of Eliquis was at 8:30 PM.  ED PROGRESS:   4:19 AM  Spoke with Dr. Vickki Muff with cards.  Agrees with medicine admission.  Cardiology can be consulted formally in the morning if needed.  4:56 AM Discussed patient's case with IMTS, Dr. Bridgett Larsson.  I have recommended admission and patient (and family if present) agree with this plan. Admitting physician will place admission orders.   I reviewed all nursing notes, vitals, pertinent previous records and reviewed/interpreted all EKGs, lab and urine results, imaging (as available).    EKG Interpretation  Date/Time:  Saturday January 13 2020 02:25:51 EST Ventricular Rate:  68 PR Interval:  206 QRS Duration: 112 QT Interval:  478 QTC Calculation: 508 R Axis:   49 Text Interpretation: Normal sinus rhythm Low voltage QRS Possible Inferior infarct , age undetermined Prolonged QT Abnormal ECG No significant change since last tracing  Confirmed by Pryor Curia [phone]) on 01/13/2020 3:57:40 AM        EKG Interpretation  Date/Time:  Saturday January 13 2020 04:09:28 EST Ventricular Rate:  57 PR Interval:  206 QRS Duration: 134 QT Interval:  516 QTC Calculation:  503 R Axis:   47 Text Interpretation: Sinus rhythm Borderline prolonged PR interval Nonspecific intraventricular conduction delay Nonspecific T abnormalities, lateral leads Confirmed by Rochele Raring [phone]) on 01/13/2020 4:59:58 AM        CRITICAL CARE Performed by: Baxter Hire Jeyda Siebel   Total critical care time: 45 minutes  Critical care time was exclusive of separately billable procedures and treating other patients.  Critical care was necessary to treat or prevent imminent or life-threatening deterioration.  Critical care was time spent personally by me on the following activities: development of treatment plan with patient and/or surrogate as well as nursing, discussions with consultants, evaluation of patient's response to treatment, examination of patient, obtaining history from patient or surrogate, ordering and performing treatments and interventions, ordering and review of laboratory studies, ordering and review of radiographic studies, pulse oximetry and re-evaluation of patient's condition.   EDGARDO PETRENKO was evaluated in Emergency Department on 01/13/2020 for the symptoms described in the history of present illness. He was evaluated in the context of the global COVID-19 pandemic, which necessitated consideration that the patient might be at risk for infection with the SARS-CoV-2 virus that causes COVID-19. Institutional protocols and algorithms that pertain to the evaluation of patients at risk for COVID-19 are in a state of rapid change based on information released by regulatory bodies including the CDC and federal and state organizations. These policies and algorithms were followed during the patient's care in the ED.      Zaydah Nawabi, Layla Maw,  DO 01/13/20 0500

## 2020-01-13 NOTE — ED Notes (Signed)
Attempted report x1. 

## 2020-01-14 ENCOUNTER — Other Ambulatory Visit: Payer: Self-pay

## 2020-01-14 DIAGNOSIS — I208 Other forms of angina pectoris: Secondary | ICD-10-CM | POA: Diagnosis not present

## 2020-01-14 DIAGNOSIS — I420 Dilated cardiomyopathy: Secondary | ICD-10-CM | POA: Diagnosis not present

## 2020-01-14 DIAGNOSIS — I480 Paroxysmal atrial fibrillation: Secondary | ICD-10-CM | POA: Diagnosis not present

## 2020-01-14 DIAGNOSIS — N1831 Chronic kidney disease, stage 3a: Secondary | ICD-10-CM | POA: Diagnosis not present

## 2020-01-14 DIAGNOSIS — I2510 Atherosclerotic heart disease of native coronary artery without angina pectoris: Secondary | ICD-10-CM | POA: Diagnosis not present

## 2020-01-14 LAB — CBC
HCT: 39.7 % (ref 39.0–52.0)
Hemoglobin: 12.8 g/dL — ABNORMAL LOW (ref 13.0–17.0)
MCH: 29.7 pg (ref 26.0–34.0)
MCHC: 32.2 g/dL (ref 30.0–36.0)
MCV: 92.1 fL (ref 80.0–100.0)
Platelets: 162 10*3/uL (ref 150–400)
RBC: 4.31 MIL/uL (ref 4.22–5.81)
RDW: 14.4 % (ref 11.5–15.5)
WBC: 8.2 10*3/uL (ref 4.0–10.5)
nRBC: 0 % (ref 0.0–0.2)

## 2020-01-14 LAB — COMPREHENSIVE METABOLIC PANEL
ALT: 18 U/L (ref 0–44)
AST: 20 U/L (ref 15–41)
Albumin: 3.4 g/dL — ABNORMAL LOW (ref 3.5–5.0)
Alkaline Phosphatase: 70 U/L (ref 38–126)
Anion gap: 11 (ref 5–15)
BUN: 13 mg/dL (ref 8–23)
CO2: 28 mmol/L (ref 22–32)
Calcium: 9 mg/dL (ref 8.9–10.3)
Chloride: 101 mmol/L (ref 98–111)
Creatinine, Ser: 1.3 mg/dL — ABNORMAL HIGH (ref 0.61–1.24)
GFR, Estimated: >60 mL/min (ref >60)
Glucose, Bld: 99 mg/dL (ref 70–99)
Potassium: 3.7 mmol/L (ref 3.5–5.1)
Sodium: 140 mmol/L (ref 135–145)
Total Bilirubin: 0.5 mg/dL (ref 0.3–1.2)
Total Protein: 6.2 g/dL — ABNORMAL LOW (ref 6.5–8.1)

## 2020-01-14 MED ORDER — AMIODARONE HCL 200 MG PO TABS
400.0000 mg | ORAL_TABLET | Freq: Every day | ORAL | 0 refills | Status: DC
Start: 2020-01-14 — End: 2020-03-19

## 2020-01-14 NOTE — Progress Notes (Signed)
Patient Saturations on Room Air at Rest = 86% Patient Saturations on 3 Liters of oxygen while Ambulating = 91%  Pt felt little discomfort but denies SOB and chest pain while ambulating. He mentioned probably he walked this far in a long while and he is feeling some discomfort, pt walked almost 150 feet. Will continue to monitor the patient  [facility] RN

## 2020-01-14 NOTE — Progress Notes (Signed)
Subjective:   O/N Events: No acute events overnight  Mr. Nakanishi was seen and evaluated at bedside this AM. He states that he is doing well this morning. He had no chest pain O/N. He was able to ambulate around the unit without chest pain or SHOB. He states that he spoke with his cardiologist this morning, and understands the plan. All questions and concerns addressed at bedside.   Objective:  Vital signs in last 24 hours: Vitals:   01/13/20 2325 01/14/20 0000 01/14/20 0030 01/14/20 0410  BP: 112/63 113/60  (!) 104/44  Pulse: (!) 55 (!) 56 (!) 57 (!) 56  Resp: 12 16 16 19   Temp: (!) 97.5 F (36.4 C)   97.7 F (36.5 C)  TempSrc: Oral   Oral  SpO2: 94% 95% 94% 99%   CBC Latest Ref Rng & Units 01/14/2020 01/13/2020 01/01/2020  WBC 4.0 - 10.5 K/uL 8.2 7.3 8.5  Hemoglobin 13.0 - 17.0 g/dL 12.8(L) 13.9 13.0  Hematocrit 39.0 - 52.0 % 39.7 43.5 40.4  Platelets 150 - 400 K/uL 162 169 163   BMP Latest Ref Rng & Units 01/14/2020 01/13/2020 01/01/2020  Glucose 70 - 99 mg/dL 99 93 92  BUN 8 - 23 mg/dL 13 11 9   Creatinine 0.61 - 1.24 mg/dL 01/03/2020) ) 9.62(I)  BUN/Creat Ratio 10 - 24 - - -  Sodium 135 - 145 mmol/L 140 141 141  Potassium 3.5 - 5.1 mmol/L 3.7 3.5 4.1  Chloride 98 - 111 mmol/L 101 98 102  CO2 22 - 32 mmol/L 28 30 25   Calcium 8.9 - 10.3 mg/dL 9.0 9.1 8.9   Physical Exam  Physical Exam Cardiovascular:     Rate and Rhythm: Normal rate and regular rhythm.     Pulses: Normal pulses.     Heart sounds: Normal heart sounds. No murmur heard. No friction rub. No gallop.   Pulmonary:     Effort: Pulmonary effort is normal.     Breath sounds: Normal breath sounds. No wheezing, rhonchi or rales.  Abdominal:     General: Abdomen is flat. Bowel sounds are normal.     Tenderness: There is no abdominal tenderness. There is no guarding.  Musculoskeletal:     Right lower leg: No edema.     Left lower leg: No edema.  Neurological:     Mental Status: He is alert and oriented to  person, place, and time.     Assessment/Plan: Max Rivas is a 70 y.o. male w/ PMH of COPD (GOLD 4) on 4L supplemental O2 at home, CAD s/p CABG 05/2019, chronic systolic heart failure (EF 30-35% 09/09/19), AICD, paroxysmal atrial fibrillation on eliquis s/p [facility] 8/31, and recurrent VT on amiodarone and mexiletine admitted for anginal chest pain. Chest pain now resolved. Pending cardiology evaluation.  Principal Problem:   Chest pain Active Problems:   DCM (dilated cardiomyopathy) (HCC)   Chronic atrial fibrillation   CKD (chronic kidney disease)   OSA (obstructive sleep apnea)   VT (ventricular tachycardia) (HCC)   Chest Pain CAD s/p CABG (05/2019) Recurrent VT ICD interrogated with no arrhythmias noted. Cleared by cardiology this AM with follow up with his cardiologist at the [facility].  --Cardiology recommendations appreciated --Nitro as needed --Continue ASA 80 mg daily --Continue Lipitor 80 mg daily --Continue telemetry   CKD 3a On arrival, sCr 1.5, improved to 1.3 this AM. -- BMP   Chronic systolic heart failure w/ AICD TTE in August 2021 revealed EF 30-35%, global hypokinesis.   --  Continue Lasix 40 mg twice daily  --Continue metoprolol 12.5 mg daily --Strict I&O's --Daily weights   Paroxysmal Afib Recurrent VT Patient reports he has had some intermittent palpitations since [facility] in August 2021. Heart rate in the 50s-70s. Patient doesn't think his ICD went off this time. States it only goes off when his HR is 150 or above. Continue to monitor on tele. Additionally on aspirin. --Continue Eliquis 5 mg BID --Continue amiodarone 400 mg  --Continue Mexitine 300 mg BID  COPD GOLD 4 OSA Patient on 2.5 L of home oxygen. Patient endorses dyspnea on exertion. States he has CPAP at home but the mask does not fit so he has not been using it. Encourage patient to talk to PCP getting on CPAP at night.ON patient saturating at 94-99% on RA.  --Continue Ellipta, Pulmicort,  albuterol --Pulse ox  Hypothyroidism --Continue Synthroid 75 mcg daily  Diet: HH IVF: None VTE: Eliquis 5 mg BID CODE: Full Code  Prior to Admission Living Arrangement: Home Anticipated Discharge Location: Home Barriers to Discharge: None Dispo: Anticipated discharge in approximately today.   Signed: Dolan Amen, MD 01/14/2020, 5:43 AM  Pager: [phone] Internal Medicine Teaching Service After 5pm on weekdays and 1pm on weekends: On Call pager: [phone]

## 2020-01-14 NOTE — Progress Notes (Signed)
Pt got discharged to home, discharge instructions provided and patient showed understanding to it, IV taken out,Telemonitor DC,pt left unit in wheelchair with all of the belongings accompanied with a family member (room partner)  Lurline Del RN

## 2020-01-14 NOTE — Plan of Care (Signed)

## 2020-01-14 NOTE — Progress Notes (Signed)
Progress Note  Patient Name: Max Rivas Date of Encounter: 01/14/2020  Primary Cardiologist: Max Noe, MD   Subjective   No chest pain or sob.   Inpatient Medications    Scheduled Meds: . amiodarone  400 mg Oral TID  . apixaban  5 mg Oral BID  . aspirin  81 mg Oral Daily  . atorvastatin  80 mg Oral Daily  . budesonide  0.5 mg Inhalation BID  . cholecalciferol  2,000 Units Oral Daily  . furosemide  40 mg Oral BID  . levothyroxine  75 mcg Oral QAC breakfast  . metoprolol succinate  12.5 mg Oral Daily  . mexiletine  300 mg Oral BID  . montelukast  10 mg Oral Daily  . omega-3 acid ethyl esters  1 g Oral BID  . pantoprazole  40 mg Oral Daily  . polyvinyl alcohol  1 drop Both Eyes QHS  . umeclidinium-vilanterol  1 puff Inhalation Daily   Continuous Infusions:  PRN Meds: acetaminophen **OR** acetaminophen, calcium carbonate, levalbuterol, nitroGLYCERIN, polyethylene glycol   Vital Signs    Vitals:   01/14/20 0718 01/14/20 0754 01/14/20 0755 01/14/20 0800  BP: (!) 104/49     Pulse: 63     Resp: 18     Temp: 97.7 F (36.5 C)     TempSrc: Oral     SpO2: 94% 96% 96%   Weight:    91 kg  Height:    5\' 11"  (1.803 m)    Intake/Output Summary (Last 24 hours) at 01/14/2020 1046 Last data filed at 01/14/2020 0718 Gross per 24 hour  Intake 950 ml  Output 3475 ml  Net -2525 ml   Filed Weights   01/14/20 0800  Weight: 91 kg    Telemetry    nsr/sb - Personally Reviewed  ECG    none - Personally Reviewed  Physical Exam   GEN: No acute distress.   Neck: No JVD Cardiac: RRR, no murmurs, rubs, or gallops.  Respiratory: Clear to auscultation bilaterally. GI: Soft, nontender, non-distended  MS: No edema; No deformity. Neuro:  Nonfocal  Psych: Normal affect   Labs    Chemistry Recent Labs  Lab 01/13/20 0237 01/14/20 0113  NA 141 140  K 3.5 3.7  CL 98 101  CO2 30 28  GLUCOSE 93 99  BUN 11 13  CREATININE 1.50* 1.30*  CALCIUM 9.1 9.0  PROT   --  6.2*  ALBUMIN  --  3.4*  AST  --  20  ALT  --  18  ALKPHOS  --  70  BILITOT  --  0.5  GFRNONAA 51* >60  ANIONGAP 13 11     Hematology Recent Labs  Lab 01/13/20 0237 01/14/20 0113  WBC 7.3 8.2  RBC 4.66 4.31  HGB 13.9 12.8*  HCT 43.5 39.7  MCV 93.3 92.1  MCH 29.8 29.7  MCHC 32.0 32.2  RDW 14.5 14.4  PLT 169 162    Cardiac EnzymesNo results for input(s): TROPONINI in the last 168 hours. No results for input(s): TROPIPOC in the last 168 hours.   BNP Recent Labs  Lab 01/13/20 0237  BNP 245.1*     DDimer No results for input(s): DDIMER in the last 168 hours.   Radiology    DG Chest 2 View  Result Date: 01/13/2020 CLINICAL DATA:  Chest pain EXAM: CHEST - 2 VIEW COMPARISON:  December 31, 2019 FINDINGS: The heart size and mediastinal contours are mildly enlarged. Aortic knob calcifications are  seen. A left-sided pacemaker seen with the lead tips in the right atrium right ventricle. Small bilateral pleural effusions are seen. Diffusely increased interstitial markings seen at both lung bases as on prior exam. No acute osseous abnormality. IMPRESSION: Small bilateral pleural effusions. Findings which could be suggestive of interstitial edema. Electronically Signed   By: Max Rivas M.D.   On: 01/13/2020 02:47    Cardiac Studies   ICD interrogation reviewed. He had VT 12/20 and 12/28.   Patient Profile     70 y.o. male with an ICM, s/p CABG in 5/21, VT, PAF s/p ablation at [facility] complicated by pseudoaneurysm, s/p surgery presenting with an episode of chest pressure/sob.  Assessment & Plan    1. VT - he has had 2 episodes treated successfully in the past 2 weeks. I am concerned about the high dose amio and I would recommend he be discharged home on 400 mg daily in conjunction with mexitil. 2. Chest pressure - this has resolved and no objective evidence of ischemia. That he is so recent CABG, I would not recommend additional workup. He will followup with Dr. Mendel Rivas. 3. PAF - he appears to be maintaining NSR. 4. Sinus node dysfunction - he has been bradycardic but this should improve with reduction of his dose of amiodarone. 5. Disp. - ok to dc home. [facility] will sign off.   Medication Recommendations:  See above Other recommendations (labs, testing, etc):  none Follow up as an outpatient:  Dr. Katrinka Rivas as scheduled.  For questions or updates, please contact [facility] Please consult [email or web address] for contact info under Cardiology/STEMI.      Signed, Max Bunting, MD  01/14/2020, 10:46 AM  Patient ID: Max Rivas, male   DOB: 12-27-1950, 70 y.o.   MRN: [id number]

## 2020-01-14 NOTE — Discharge Instructions (Signed)
To Mr. Choplin,  It was a pleasure taking care of you during your time at [facility]. During your stay we did an evaluation for your chest pain. After evaluating your device, it did not appear that you had any arrhythmias. We will decrease your amiodarone to 400 mg once daily instead of three times daily. Please continue to take the rest of your medications as indicated. Please follow up with your cardiologist in the outpatient setting.

## 2020-01-18 NOTE — Discharge Summary (Signed)
Name: Max Rivas MRN: [id number] DOB: June 14, 1950 70 y.o. PCP: [name], MD  Date of Admission: 01/13/2020  2:16 AM Date of Discharge: 01/14/2020 Attending Physician: Lalla Brothers, MD Discharge Diagnosis: 1. Chest Pain 2. CKD 3a 3. Chronic systolic heart failure w/ AICD 4. Paroxysmal Afib 5. COPD GOLD 4/OSA 6. Hypothyroidism  Discharge Medications: Allergies as of 01/14/2020       Reactions   Crestor [rosuvastatin Calcium] Other (See Comments)   Back spasms, but can tolerate it at a low dose now   Rosuvastatin Other (See Comments)   Back spasms, but can tolerate it at a low dose now        Medication List     TAKE these medications    acetaminophen 325 MG tablet Commonly known as: TYLENOL Take 2 tablets (650 mg total) by mouth every 6 (six) hours as needed for mild pain (or Fever >/= 101).   amiodarone 200 MG tablet Commonly known as: Pacerone Take 2 tablets (400 mg total) by mouth daily. What changed:   medication strength  when to take this Notes to patient: Next dose 01/15/2020   apixaban 5 MG Tabs tablet Commonly known as: ELIQUIS Take 5 mg by mouth 2 (two) times daily. Notes to patient: Next dose this evening   aspirin 81 MG chewable tablet Chew 1 tablet (81 mg total) by mouth daily. Notes to patient: Next dose 01/15/2020   atorvastatin 80 MG tablet Commonly known as: LIPITOR Take 1 tablet (80 mg total) by mouth daily. What changed: when to take this Notes to patient: Next dose 01/15/2020   carboxymethylcellulose 0.5 % Soln Commonly known as: REFRESH PLUS Place 1 drop into both eyes at bedtime.   furosemide 40 MG tablet Commonly known as: LASIX Take 40 mg by mouth 2 (two) times daily. Notes to patient: This evening 01/14/2020   gabapentin 100 MG capsule Commonly known as: NEURONTIN Take 2 capsules (200 mg total) by mouth 2 (two) times daily. Notes to patient: This evening 01/14/2020   levalbuterol 45 MCG/ACT inhaler Commonly known  as: XOPENEX HFA Inhale 2 puffs into the lungs every 6 (six) hours as needed for wheezing or shortness of breath.   levothyroxine 75 MCG tablet Commonly known as: SYNTHROID Take 75 mcg by mouth daily before breakfast. Notes to patient: Next dose 01/15/2020   metoprolol succinate 25 MG 24 hr tablet Commonly known as: TOPROL-XL Take 12.5 mg by mouth daily. Notes to patient: Next dose 01/15/2020   mexiletine 150 MG capsule Commonly known as: MEXITIL Take 2 capsules (300 mg total) by mouth 2 (two) times daily. Notes to patient: This evening   mometasone 220 MCG/INH inhaler Commonly known as: ASMANEX Inhale 2 puffs into the lungs 2 (two) times daily. Notes to patient: This evening   montelukast 10 MG tablet Commonly known as: SINGULAIR Take 10 mg by mouth daily. Notes to patient: Next dose 01/15/2020   multivitamin with minerals Tabs tablet Take 1 tablet by mouth daily.   nitroGLYCERIN 0.4 MG SL tablet Commonly known as: NITROSTAT Place 0.4 mg under the tongue every 5 (five) minutes as needed for chest pain.   omega-3 acid ethyl esters 1 g capsule Commonly known as: LOVAZA Take 1 capsule (1 g total) by mouth 2 (two) times daily. Notes to patient: Next dose 01/15/2020   omeprazole 20 MG capsule Commonly known as: PRILOSEC Take 20 mg by mouth 2 (two) times daily before a meal. Notes to patient: Next dose 01/15/2020   Systane  Balance 0.6 % Soln Generic drug: Propylene Glycol Place 1 drop into both eyes See admin instructions. Place 1 drop into each eye four times a day and apply a warm compress for 10 minutes afterwards   Tiotropium Bromide-Olodaterol 2.5-2.5 MCG/ACT Aers Inhale 2 puffs into the lungs daily.   Vitamin D3 50 MCG (2000 UT) Tabs Take 2,000 Units by mouth daily. Notes to patient: Next dose 01/15/2020        Disposition and follow-up:   Max Rivas was discharged from [facility] in Stable condition.  At the hospital follow up visit please  address:  1. Chest Pain CAD s/p CABG (05/2019) Recurrent VT --Nitro as needed --Continue ASA 80 mg daily --Continue Lipitor 80 mg daily   2. CKD 3a -- BMP   3. Chronic systolic heart failure w/ AICD   --Continue Lasix 40 mg twice daily  --Continue metoprolol 12.5 mg daily   4. Paroxysmal Afib Recurrent VT --Continue Eliquis 5 mg twice daily -- Lowered amiodarone to 400 mg daily --Continue Mexitine 300 mg twice daily   COPD GOLD 4 OSA --Continue Ellipta, Pulmicort, albuterol --Pulse ox  2.  Labs / imaging needed at time of follow-up: BMP  3.  Pending labs/ test needing follow-up: None  Follow-up Appointments:  Follow-up Information     Anson Fret, MD. Schedule an appointment as soon as possible for a visit in 1 week(s).   Specialty: Family Medicine Contact information: [address] [place] [address] [phone]         Marinus Maw, MD .   Specialty: Cardiology Contact information: [phone] [address]. [address] [place] [address] [phone]         Lyn Records, MD .   Specialty: Cardiology Contact information: [phone] [address] [place] [address] [phone]                 Hospital Course by problem list:  Chest Pain CAD s/p CABG (05/2019) Recurrent VT Patient, with a complex cardiac Hx, presented to the ED with anginal chest pain the night before admission. He was admitted two weeks prior with a similar presentation. His pain improved in the ED with sublingual nitroglycerin and aspirin. He was hemodynamically stable and asymptomatic on presentation. His troponin labs were flat, and EKG showed no ST changes compared to his prior EKG. Cardiology was consulted for interrogation of the patient's pacemaker, which was unrevealing. Patient was discharged in stable condition on nitro, aspirin 80 mg daily, Lipitor 80 mg daily.    CKD 3a Patient with a baseline sCr of 1.1-1.2 arrived  with a sCr 1.5. Patient appeared to be euvolemic on physical examination during on admission. BMP was trended and his sCr improved to 1.3 the following AM. He was discharged in stable condition.    Chronic systolic heart failure w/ AICD TTE in August 2021 revealed EF 30-35%, global hypokinesis. His BNP was mildly elevated, and his imaging was revealing for small pleural effusions. He was continued on Lasix 40 mg twice daily and his metoprolol 12.5 mg daily. He was discharged in stable condition on his home dose of Lasix and metoprolol.    Paroxysmal Afib Recurrent VT Patient reported some intermittent palpitations since [facility] in August 2021. Heart rate in the 50s-70s. Patient doesn't think his ICD went off during these past events, stating it only goes off when his HR is greater than 150. He was monitored on telemetry during and was discharged  the next day in stable condition on his home dose of Eiquis, a decreased dose of his amiodarone, and Mexitine.    COPD GOLD 4 OSA Patient on 2.5 L of home oxygen. Patient endorses dyspnea on exertion. He stated that his CPAP at home mask does not fit so he has not been using it, but will speak with his PCP for fitting. His home medications were continued during his stay. He was observed overnight on RA, and saturated at 94-99%. He was discharged in stable conditions on his home medications of Ellipta, Pulmicort, and albuterol.   Hypothyroidism Patient with hypothyroidism was continued on his Synthroid 75 mcg daily during his hospitalization. He was discharged in stable condition with his home synthroid.   Discharge Vitals:   BP 131/70 (BP Location: Right Arm)   Pulse 67   Temp 98 F (36.7 C) (Oral)   Resp 18   Ht 5\' 11"  (1.803 m)   Wt 91 kg   SpO2 93%   BMI 27.98 kg/m   Pertinent Labs, Studies, and Procedures:   Component Ref Range & Units 5 d ago  (01/13/20) 5 d ago  (01/13/20) 2 wk ago  (12/31/19) 2 wk ago  (12/31/19) 1 mo ago  (12/07/19) 1 mo  ago  (12/06/19) 2 mo ago  (11/08/19)  Troponin I (High Sensitivity) <18 ng/L 65High  93High         Component Ref Range & Units 5 d ago  (01/13/20) 2 wk ago  (12/31/19) 1 mo ago  (12/07/19) 2 mo ago  (11/08/19) 8 mo ago  (05/21/19) 8 mo ago  (05/11/19) 4 yr ago  (01/15/16)  SARS Coronavirus 2 by RT PCR NEGATIVE NEGATIVE          Component Ref Range & Units 5 d ago  (01/13/20) 2 wk ago  (12/31/19) 1 mo ago  (12/07/19) 2 mo ago  (11/08/19) 4 mo ago  (09/20/19) 4 mo ago  (09/18/19) 4 mo ago  (09/08/19)  B Natriuretic Peptide 0.0 - 100.0 pg/mL 245.1High  339.1High CM  167.4High CM  1,502.1High CM  549.2High CM  601.0High CM  455.0High   EXAM: CHEST - 2 VIEW  COMPARISON:  December 31, 2019  FINDINGS: The heart size and mediastinal contours are mildly enlarged. Aortic knob calcifications are seen. A left-sided pacemaker seen with the lead tips in the right atrium right ventricle. Small bilateral pleural effusions are seen. Diffusely increased interstitial markings seen at both lung bases as on prior exam. No acute osseous abnormality.  IMPRESSION: Small bilateral pleural effusions.  Findings which could be suggestive of interstitial edema.  Discharge Instructions: Discharge Instructions     Diet - low sodium heart healthy   Complete by: As directed    Increase activity slowly   Complete by: As directed    Nursing communication   Complete by: As directed    Interrogate pacemaker       Signed: January 02, 2020, MD 01/18/2020, 6:44 AM   Pager: [phone]

## 2020-01-25 DIAGNOSIS — R0600 Dyspnea, unspecified: Secondary | ICD-10-CM | POA: Diagnosis not present

## 2020-01-25 DIAGNOSIS — I4891 Unspecified atrial fibrillation: Secondary | ICD-10-CM | POA: Diagnosis not present

## 2020-01-25 DIAGNOSIS — Z9981 Dependence on supplemental oxygen: Secondary | ICD-10-CM | POA: Diagnosis not present

## 2020-01-25 DIAGNOSIS — R079 Chest pain, unspecified: Secondary | ICD-10-CM | POA: Diagnosis not present

## 2020-01-31 NOTE — Progress Notes (Deleted)
CARDIOLOGY OFFICE NOTE  Date:  02/12/2020     [name] Date of Birth: [birth year] Medical Record #[id number]  PCP:  Anson Fret, MD  Cardiologist:  Anastasia Fiedler  No chief complaint on file.   History of Present Illness: Max Rivas is a 70 y.o. male who presents today for a follow up visit/post ER visit. Seen for Dr. Katrinka Blazing and Dr. Ladona Ridgel.   He has a history of known CAD with prior multiple MIs and PCIs - last in in 2004 at [facility], CHF with an ICD in place, AF - on anticoagulation with Eliquis and was previously on amiodarone, HLD, OSA - intolerant of CPAP, and COPD.    Last seen in March of 2019 by Dr. Katrinka Blazing and Dr. Ladona Ridgel in June of 2019.    He then had 3 recent admissions back last spring - Presented in March 2021 with multiple recurrent shocks as well as chest pain. Placed back on amiodarone. Recurrent shock in early May - He underwent a left heart catheterization which showed in-stent restenosis of the circumflex stent, and this was thought to be the cause of his ischemic arrhythmias.There was LAD and DX disease as well.  TCTS was consulted to discuss plans for surgical revascularization. He was told to not drive for 6 months.    Initially the patient did not wish to proceed with surgical revascularization. He wanted a second opinion.   He was continued on Amiodarone that had been recently resumed and had Ranexa 500 mg bid added by cardiology and ultimately discharged.   Was then re admitted in May as he had further chest pain. After further discussion, he then opted for coronary artery bypass grafting surgery. Pre operative carotid duplex US showed no significant internal carotid artery stenosis bilaterally. He underwent a CABG x 3 and LA clip on 05/23/2019 by Dr. Laneta Simmers.    Post op course was uneventful. He did have AF with RVR - digoxin was added. Discharged in AF. Placed back on Eliquis and amiodarone. Had expected volume overload and  blood loss anemia. Required antibiotics for H flu on sputum culture. Did require home oxygen. He was orthostatic and required Midodrine. Ranexa was stopped.   I then saw him for his post op check in early June - not smoking. Lasix had been restarted. He was going to follow up at the [place]. [facility]old me the [facility] was to [facility]. He had had recent shock and the [facility] had reached out to him. He was not driving. Sees Dr. Jillyn Hidden with the [facility] and Dr. Archie Patten with pulmonary. He was going to transition fully over to the [place].   Last seen by Dr. Ladona Ridgel in August of 2021.   He has ha several recent visits to [facility] - last about a week ago - having chest pain - noted that he has had EPS/ablation 9/21 for PVC/VT; complicated by groin hematoma that required surgical intervention; general change as well - and then multiple admissions for VT since - he has been on high dose amiodarone which was cut back by Dr. Ladona Ridgel to 400 mg daily - also on Mexiletine 300 mg BID - was previously on Ranexa.   Noted he has appointment with [facility] later this week for further discussion of his VT.     Care Everywhere note by [facility] Susie Cassette, MD  12/20/202: "Patient has a poor prognosis considering the large burden of LV scar. He  has para-aortic substrate as well. The options at this point include another attempted ablation or referral to VT center of excellence like [facility]. [facility] and see if he has a candidacy for heart transplant or advanced therapies. Medication options may be limited. Reablation attempts may also not be completely successful. I discussed this in detail with the patient and he expressed understanding. I will discuss with the [facility] and see what best we can do".   Comes in today. Here with   Past Medical History:  Diagnosis Date  . Arthritis    "elbows, knees" (02/24/2016)  . Bronchitis 2006  . CAD (coronary artery disease)    a. s/p prior MIs - 1995 x 2, 1998; b. s/p prior LCX  stenting; c. 04/2019 Cath: LM min irregs, LAD 65p/mi, D1 75, LCX 99ost/p, 6935m (ISR), OM2 80, RCA/RPDA mod diff dzs; d. 05/2019 CABG x 3: LIMA->LAD, VG->Diag, VG->OM.  Marland Kitchen. COPD (chronic obstructive pulmonary disease) (HCC)    a. Remote tobacco-->on home O2.  Marland Kitchen. GERD (gastroesophageal reflux disease)   . 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 05/2019 Echo: EF 40-45%, gr2 DD. Nl RV size/fxn. Mildly dil LA. Mild MR.  . High cholesterol   . Ischemic cardiomyopathy    a. 05/2019 Echo: EF 40-45%.  . Morbid obesity (HCC)   . Myocardial infarction [facility](HCC) ~ 1995 X 2;1998; 2000; 2004  . On home oxygen therapy    "2L w/activity" (02/24/2016)  . PAF (paroxysmal atrial fibrillation) (HCC)    a. CHA2DS2VASc = 4-->eliquis. Also on amio.  . Pulmonary embolism (HCC) 02/24/2016  . Sleep apnea    "have CPAP; can't tolerate it" (02/24/2016)  . Ventricular tachycardia (HCC)    a. 2005 s/p ICD; b. 03/2011 Device upgrade/lead exchange: MDT Protecta XT VR single lead AICD.    Past Surgical History:  Procedure Laterality Date  . CARDIOVERSION N/A 09/11/2019   Procedure: CARDIOVERSION;  Surgeon: Jodelle Redhristopher, Bridgette, MD;  [facility];  Service: Cardiovascular;  Laterality: N/A;  . CLIPPING OF ATRIAL APPENDAGE N/A 05/23/2019   Procedure: CLIPPING OF ATRIAL APPENDAGE with atriclip;  Surgeon: Alleen BorneBartle, Bryan K, MD;  [facility];  Service: Open Heart Surgery;  Laterality: N/A;  . CORONARY ANGIOPLASTY  1995  . CORONARY ANGIOPLASTY WITH STENT PLACEMENT  ~ 1995 - 2004 X 5   "I've got a total of 5 stents" (02/24/2016)  . CORONARY ARTERY BYPASS GRAFT N/A 05/23/2019   Procedure: CORONARY ARTERY BYPASS GRAFTING (CABG), times three, using right greater saphenous vein and left internal mammary;  Surgeon: Alleen BorneBartle, Bryan K, MD;  Location: [facility] OR;  Service: Open Heart Surgery;  Laterality: N/A;  [place] only  . INSERT / REPLACE / REMOVE PACEMAKER  07/2003   original PPM around 2004 for ICM with EF <  35%  . INTRAVASCULAR PRESSURE WIRE/FFR STUDY N/A 05/12/2019   Procedure: INTRAVASCULAR PRESSURE WIRE/FFR STUDY;  Surgeon: Lyn RecordsSmith, Henry W, MD;  Location: [facility] INVASIVE CV LAB;  Service: Cardiovascular;  Laterality: N/A;  . LEFT HEART CATH AND CORONARY ANGIOGRAPHY N/A 05/12/2019   Procedure: LEFT HEART CATH AND CORONARY ANGIOGRAPHY;  Surgeon: Lyn RecordsSmith, Henry W, MD;  Location: [facility] INVASIVE CV LAB;  Service: Cardiovascular;  Laterality: N/A;  . PACEMAKER GENERATOR CHANGE  03/2011    [facility]  . TEE WITHOUT CARDIOVERSION N/A 05/23/2019   Procedure: TRANSESOPHAGEAL ECHOCARDIOGRAM (TEE);  Surgeon: Alleen BorneBartle, Bryan K, MD;  [facility];  Service: Open Heart Surgery;  Laterality: N/A;  .  TONSILLECTOMY       Medications: No outpatient medications have been marked as taking for the 02/13/20 encounter (Appointment) with Rosalio Macadamia, NP.     Allergies: Allergies  Allergen Reactions  . Crestor [Rosuvastatin Calcium] Other (See Comments)    Back spasms, but can tolerate it at a low dose now  . Rosuvastatin Other (See Comments)    Back spasms, but can tolerate it at a low dose now    Social History: The patient  reports that he quit smoking about 8 months ago. His smoking use included cigarettes. He has a 105.00 pack-year smoking history. He quit smokeless tobacco use about 2 years ago. He reports previous alcohol use. He reports previous drug use. Drug: Cocaine.   Family History: The patient's ***family history includes Heart attack in his father and mother; Heart attack (age of onset: 49) in his sister.   Review of Systems: Please see the history of present illness.   All other systems are reviewed and negative.   Physical Exam: VS:  There were no vitals taken for this visit. Marland Kitchen  BMI There is no height or weight on file to calculate BMI.  Wt Readings from Last 3 Encounters:  01/14/20 200 lb 9.6 oz (91 kg)  01/01/20 204 lb 12.9 oz (92.9 kg)  12/07/19 204 lb 2.3 oz (92.6 kg)    General:  Pleasant. Well developed, well nourished and in no acute distress.   HEENT: Normal.  Neck: Supple, no JVD, carotid bruits, or masses noted.  Cardiac: ***Regular rate and rhythm. No murmurs, rubs, or gallops. No edema.  Respiratory:  Lungs are clear to auscultation bilaterally with normal work of breathing.  GI: Soft and nontender.  MS: No deformity or atrophy. Gait and ROM intact.  Skin: Warm and dry. Color is normal.  Neuro:  Strength and sensation are intact and no gross focal deficits noted.  Psych: Alert, appropriate and with normal affect.   LABORATORY DATA:  EKG:  EKG {ACTION; IS/IS [id number]} ordered today.  Personally reviewed by me. This demonstrates ***.  Lab Results  Component Value Date   WBC 8.0 02/04/2020   HGB 13.5 02/04/2020   HCT 42.7 02/04/2020   PLT 167 02/04/2020   GLUCOSE 113 (H) 02/04/2020   CHOL 126 05/13/2019   TRIG 38 09/19/2019   HDL 36 (L) 05/13/2019   LDLCALC 70 05/13/2019   ALT 18 01/14/2020   AST 20 01/14/2020   NA 141 02/04/2020   K 4.2 02/04/2020   CL 102 02/04/2020   CREATININE 1.18 02/04/2020   BUN 13 02/04/2020   CO2 26 02/04/2020   TSH 1.630 12/31/2019   INR 1.4 (H) 11/08/2019   HGBA1C 5.4 12/31/2019     BNP (last 3 results) Recent Labs    12/07/19 0453 12/31/19 1849 01/13/20 0237  BNP 167.4* 339.1* 245.1*    ProBNP (last 3 results) No results for input(s): PROBNP in the last 8760 hours.   Other Studies Reviewed Today:  Echo 09/2019 @ [facility] The left ventricle is mildly dilated.  Left ventricular systolic function is moderate to severely reduced.  LV ejection fraction = 25-30%.  Abnormal septal motion noted  Unable to fully assess LV regional wall motion but likely multivessel  wall motion abnormalities  The right ventricle is normal size.  The right ventricular systolic function is mild to moderately reduced.  Device lead in the right ventricle  The left atrium is mildly dilated.  The right atrium is mildly  dilated.  There is moderate to severe mitral regurgitation.  The mitral regurgitant jet is eccentrically directed.  Etiology of mitral regurgitation is ischemic heart disease .  There is mild tricuspid regurgitation.  Estimated right ventricular systolic pressure is 57 mmHg.  Estimated right atrial pressure is 10 mmHg..  Moderate pulmonary hypertension.  There is no pericardial effusion.  There is a pleural effusion present.  There is no comparison study available.     ECHO IMPRESSIONS 08/2019  1. Left ventricular ejection fraction, by estimation, is 30 to 35%. The  left ventricle has moderately decreased function. The left ventricle  demonstrates global hypokinesis. The left ventricular internal cavity size  was moderately dilated. Left  ventricular diastolic parameters are consistent with Grade I diastolic  dysfunction (impaired relaxation).  2. Right ventricular systolic function is moderately reduced. The right  ventricular size is mildly enlarged. There is moderately elevated  pulmonary artery systolic pressure.  3. Left atrial size was severely dilated.  4. Right atrial size was mildly dilated.  5. The mitral valve is normal in structure. Mild mitral valve  regurgitation. No evidence of mitral stenosis.  6. The aortic valve is normal in structure. Aortic valve regurgitation is  not visualized. No aortic stenosis is present.  7. The inferior vena cava is dilated in size with <50% respiratory  variability, suggesting right atrial pressure of 15 mmHg.   Procedure (s) 05/2019:  1. Median Sternotomy 2. Extracorporeal circulation 3.   Coronary artery bypass grafting x 3    Left internal mammary artery graft to the LAD  SVG to diagonal  SVG to OM   4.   Endoscopic vein harvest from the right leg 5.   Clipping of left atrial appendage by Dr. Laneta Simmers on 05/23/2019.     Left heart catheterization 05/12/19:  Recurrent, probably ischemically mediated ventricular  tachycardia with multiple shocks.  High-grade native and diffuse in-stent restenosis ostial to mid circumflex 95 to 99%.  There is also a 90% stenosis beyond the stented segment.  Widely patent left main  Eccentric bulky and calcified 50 to 65% proximal to mid LAD beyond the origin of the large first diagonal.  Eccentric bulky and calcified ostial to proximal 70% first diagonal.  Both LAD and diagonal are mildly hemodynamically significant based upon RFR values 0.89 and 0.87, respectively.  RFR 0.89 or less is hemodynamically significant.  The right coronary particularly in the mid segment contains diffuse atherosclerosis but no focal high-grade obstruction.  Left ventriculography is not performed because of renal insufficiency.  LVEDP is normal measuring 10 mmHg.   RECOMMENDATIONS:    We need to determine if the patient has any surgical options.  The likelihood of durable circumflex result is low due to the diffuse nature of disease and the need to place stents over a long segment, most of which would be stent sandwich.  The LAD and diagonal are mildly hemodynamically significant and could be grafted.  If PCI, will need orbital atherectomy over a long segment then reexpansion of the in-stent restenosis and if resistant, possibly shockwave PCI followed by extensive ostial to mid vessel restenting.   Echocardiogram 05/13/19: 1. Akinesis of the inferior and inferolateral walls with overall mild to  moderate LV dysfunction; grade 2 diastolic dysfunction; mild MR; mild LAE.   2. Left ventricular ejection fraction, by estimation, is 40 to 45%. The  left ventricle has mildly decreased function. The left ventricle  demonstrates regional wall motion abnormalities (see scoring  diagram/findings for description). The left ventricular  internal cavity size was moderately dilated. Left ventricular diastolic  parameters are consistent with Grade II diastolic dysfunction  (pseudonormalization).   3.  Right ventricular systolic function is normal. The right ventricular  size is normal.   4. Left atrial size was mildly dilated.   5. The mitral valve is normal in structure. Mild mitral valve  regurgitation. No evidence of mitral stenosis.   6. The aortic valve has an indeterminant number of cusps. Aortic valve  regurgitation is not visualized. No aortic stenosis is present.   7. The inferior vena cava is normal in size with greater than 50%  respiratory variability, suggesting right atrial pressure of 3 mmHg.      Assessment and Plan:    1. Recurrent VT s/p ICD and ablation 09/2019 - Multiple admission for VT, most recent on 01/13/20. Seen by Dr. Ladona Ridgel and reduce amiodarone.  - [place] interrogation showed  1 treated VT zone episode with 1 ATP on 01/23/20 @ 21:14 - Seem pt did not felt it 4 monitor VT NS episodes, lasting 2 seconds each on 1/13, 1/18, 1/22 @ 13:19 & 1/22 @ 21.26 (around time when pt felt palpitations and pressure) - [facility] Susie Cassette, MD on 02/15/20 for further discussion of recurrent VT - TSH was normal on 12/31/19  2. Chest pressure with hx of CAD s/p CABG  - Had chest pressure with last VT admission on 01/13/20 as well.  - Yesterday again had chest pressure while had 2 sec of VT. Associated with tingling of hands and fingers. Chest pressure similar to prior angina when had CABG but less intensity. Resolved after SL nitro x 2. Patient has been ruled out of ACS. Troponin negative x 3. EKG without acute ischemic changes.   3. Chronic systolic CHF - EF 30-35 on 08/2019 % 25-30% on 09/2019 @ [facility] - Euvolemic - Continue Toprol XL 12.5mg  qd (unable to titrate further 2nd to baseline bradycardia) -Continue home lasix 40mg  BID - ? Why not on advanced HF medications. Could be soft blood pressure.   4. PAF - Maintaining sinus rhythm -  On Amiodarone and BB - On Eliquis for anticoagulation  - Denies bleeding issue  5. COPD on 4  L oxygen - No active wheezing    Current medicines are reviewed with the patient today.  The patient does not have concerns regarding medicines other than what has been noted above.  The following changes have been made:  See above.  Labs/ tests ordered today include:   No orders of the defined types were placed in this encounter.    Disposition:   FU with *** in {gen number {Days to years:10300}.   Patient is agreeable to this plan and will call if any problems develop in the interim.   [id number]}, NP  02/12/2020 9:55 AM  [facility] [address] [place], [place]  [place] Phone: [phone] Fax: [phone]

## 2020-02-01 DIAGNOSIS — Z7189 Other specified counseling: Secondary | ICD-10-CM | POA: Diagnosis not present

## 2020-02-01 DIAGNOSIS — Z9181 History of falling: Secondary | ICD-10-CM | POA: Diagnosis not present

## 2020-02-01 DIAGNOSIS — Z0000 Encounter for general adult medical examination without abnormal findings: Secondary | ICD-10-CM | POA: Diagnosis not present

## 2020-02-04 ENCOUNTER — Emergency Department (HOSPITAL_COMMUNITY)
Admission: EM | Admit: 2020-02-04 | Discharge: 2020-02-04 | Disposition: A | Discharge status: Home / Self Care | Payer: No Typology Code available for payment source | Attending: Emergency Medicine | Admitting: Emergency Medicine

## 2020-02-04 ENCOUNTER — Emergency Department (HOSPITAL_COMMUNITY): Payer: No Typology Code available for payment source

## 2020-02-04 ENCOUNTER — Other Ambulatory Visit: Payer: Self-pay

## 2020-02-04 DIAGNOSIS — Z86711 Personal history of pulmonary embolism: Secondary | ICD-10-CM | POA: Insufficient documentation

## 2020-02-04 DIAGNOSIS — R079 Chest pain, unspecified: Secondary | ICD-10-CM

## 2020-02-04 DIAGNOSIS — I130 Hypertensive heart and chronic kidney disease with heart failure and stage 1 through stage 4 chronic kidney disease, or unspecified chronic kidney disease: Secondary | ICD-10-CM | POA: Diagnosis not present

## 2020-02-04 DIAGNOSIS — I5023 Acute on chronic systolic (congestive) heart failure: Secondary | ICD-10-CM | POA: Insufficient documentation

## 2020-02-04 DIAGNOSIS — Z79899 Other long term (current) drug therapy: Secondary | ICD-10-CM | POA: Diagnosis not present

## 2020-02-04 DIAGNOSIS — I4891 Unspecified atrial fibrillation: Secondary | ICD-10-CM | POA: Diagnosis not present

## 2020-02-04 DIAGNOSIS — R072 Precordial pain: Secondary | ICD-10-CM | POA: Diagnosis not present

## 2020-02-04 DIAGNOSIS — I2510 Atherosclerotic heart disease of native coronary artery without angina pectoris: Secondary | ICD-10-CM | POA: Insufficient documentation

## 2020-02-04 DIAGNOSIS — E039 Hypothyroidism, unspecified: Secondary | ICD-10-CM | POA: Insufficient documentation

## 2020-02-04 DIAGNOSIS — Z7982 Long term (current) use of aspirin: Secondary | ICD-10-CM | POA: Insufficient documentation

## 2020-02-04 DIAGNOSIS — I5020 Unspecified systolic (congestive) heart failure: Secondary | ICD-10-CM | POA: Diagnosis not present

## 2020-02-04 DIAGNOSIS — I472 Ventricular tachycardia: Secondary | ICD-10-CM | POA: Diagnosis not present

## 2020-02-04 DIAGNOSIS — J449 Chronic obstructive pulmonary disease, unspecified: Secondary | ICD-10-CM | POA: Insufficient documentation

## 2020-02-04 DIAGNOSIS — Z87891 Personal history of nicotine dependence: Secondary | ICD-10-CM | POA: Diagnosis not present

## 2020-02-04 DIAGNOSIS — Z951 Presence of aortocoronary bypass graft: Secondary | ICD-10-CM | POA: Diagnosis not present

## 2020-02-04 DIAGNOSIS — Z950 Presence of cardiac pacemaker: Secondary | ICD-10-CM | POA: Insufficient documentation

## 2020-02-04 DIAGNOSIS — N189 Chronic kidney disease, unspecified: Secondary | ICD-10-CM | POA: Insufficient documentation

## 2020-02-04 DIAGNOSIS — R0602 Shortness of breath: Secondary | ICD-10-CM | POA: Diagnosis not present

## 2020-02-04 DIAGNOSIS — Z7901 Long term (current) use of anticoagulants: Secondary | ICD-10-CM | POA: Insufficient documentation

## 2020-02-04 DIAGNOSIS — I25810 Atherosclerosis of coronary artery bypass graft(s) without angina pectoris: Secondary | ICD-10-CM

## 2020-02-04 LAB — BASIC METABOLIC PANEL
Anion gap: 13 (ref 5–15)
BUN: 13 mg/dL (ref 8–23)
CO2: 26 mmol/L (ref 22–32)
Calcium: 9.1 mg/dL (ref 8.9–10.3)
Chloride: 102 mmol/L (ref 98–111)
Creatinine, Ser: 1.18 mg/dL (ref 0.61–1.24)
GFR, Estimated: >60 mL/min (ref >60)
Glucose, Bld: 113 mg/dL — ABNORMAL HIGH (ref 70–99)
Potassium: 4.2 mmol/L (ref 3.5–5.1)
Sodium: 141 mmol/L (ref 135–145)

## 2020-02-04 LAB — CBC
HCT: 42.7 % (ref 39.0–52.0)
Hemoglobin: 13.5 g/dL (ref 13.0–17.0)
MCH: 29.7 pg (ref 26.0–34.0)
MCHC: 31.6 g/dL (ref 30.0–36.0)
MCV: 93.8 fL (ref 80.0–100.0)
Platelets: 167 10*3/uL (ref 150–400)
RBC: 4.55 MIL/uL (ref 4.22–5.81)
RDW: 14 % (ref 11.5–15.5)
WBC: 8 10*3/uL (ref 4.0–10.5)
nRBC: 0 % (ref 0.0–0.2)

## 2020-02-04 LAB — MAGNESIUM: Magnesium: 2.2 mg/dL (ref 1.7–2.4)

## 2020-02-04 LAB — TROPONIN I (HIGH SENSITIVITY)
Troponin I (High Sensitivity): 10 ng/L (ref <18)
Troponin I (High Sensitivity): 13 ng/L (ref <18)
Troponin I (High Sensitivity): 11 ng/L (ref <18)

## 2020-02-04 NOTE — ED Provider Notes (Addendum)
[name] EMERGENCY DEPARTMENT Provider Note   CSN: [id number] Arrival date & time: 02/04/20  0010     History Chief Complaint  Patient presents with  . Chest Pain    Max Rivas is a 70 y.o. male.  Patient presents to the emergency department for evaluation of chest pain.  Patient reports that he was sitting at rest when he had onset of severe substernal chest pain.  He did then noticed some tingling in his fingertips and also felt short of breath.  Patient called EMS.  He was given 2 nitroglycerin by EMS and his pain resolved.  Patient arrived to the ED with no pain and has not had any recurrence of pain.        Past Medical History:  Diagnosis Date  . Arthritis    "elbows, knees" (02/24/2016)  . Bronchitis 2006  . CAD (coronary artery disease)    a. s/p prior MIs - 1995 x 2, 1998; b. s/p prior LCX stenting; c. 04/2019 Cath: LM min irregs, LAD 65p/mi, D1 75, LCX 99ost/p, 50m (ISR), OM2 80, RCA/RPDA mod diff dzs; d. 05/2019 CABG x 3: LIMA->LAD, VG->Diag, VG->OM.  Marland Kitchen COPD (chronic obstructive pulmonary disease) (HCC)    a. Remote tobacco-->on home O2.  Marland Kitchen GERD (gastroesophageal reflux disease)   . 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 05/2019 Echo: EF 40-45%, gr2 DD. Nl RV size/fxn. Mildly dil LA. Mild MR.  . High cholesterol   . Ischemic cardiomyopathy    a. 05/2019 Echo: EF 40-45%.  . Morbid obesity (HCC)   . Myocardial infarction [facility]) ~ 1995 X 2;1998; 2000; 2004  . On home oxygen therapy    "2L w/activity" (02/24/2016)  . PAF (paroxysmal atrial fibrillation) (HCC)    a. CHA2DS2VASc = 4-->eliquis. Also on amio.  . Pulmonary embolism (HCC) 02/24/2016  . Sleep apnea    "have CPAP; can't tolerate it" (02/24/2016)  . Ventricular tachycardia (HCC)    a. 2005 s/p ICD; b. 03/2011 Device upgrade/lead exchange: MDT Protecta XT VR single lead AICD.    Patient Active Problem List   Diagnosis Date Noted  . Ventricular  tachyarrhythmia (HCC) 12/07/2019  . Cardiomyopathy, ischemic   . Vomiting   . Acute respiratory failure with hypoxia (HCC) 09/18/2019  . Acute on chronic heart failure (HCC) 09/08/2019  . Unstable angina (HCC) 05/21/2019  . Ventricular tachycardia (HCC) 05/12/2019  . Chest pain   . VT (ventricular tachycardia) (HCC) 05/11/2019  . Arrhythmia 03/12/2019  . On home O2   . Chronic HFrEF (heart failure with reduced ejection fraction) (HCC) 06/22/2018  . Chronic respiratory failure with hypoxia (HCC) 06/22/2018  . Congestive heart failure (CHF) (HCC) 06/22/2018  . DCM (dilated cardiomyopathy) (HCC) 05/25/2017  . Coronary artery disease 05/25/2017  . Chronic atrial fibrillation 05/25/2017  . COPD (chronic obstructive pulmonary disease) (HCC) 05/25/2017  . CKD (chronic kidney disease) 05/25/2017  . Hypothyroidism 05/25/2017  . 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 OSA (obstructive sleep apnea) 05/25/2017  . Pulmonary HTN (HCC) 05/25/2017  . Mediastinal adenopathy   . Cervical adenopathy   . Acute respiratory failure with hypoxia and hypercarbia (HCC) 02/24/2016  . Pulmonary embolus (HCC) 02/24/2016  . Pulmonary artery thrombosis (HCC)   . Pleural effusion   . Lung mass   . Pleural plaque   . Flutter-fibrillation 01/15/2016  . Acute systolic congestive heart failure (HCC)   . Atrial fibrillation with RVR (HCC)   . Hypercholesterolemia  03/11/2006  . Tobacco abuse 03/11/2006  . Acute on chronic systolic congestive heart failure (HCC) 03/11/2006  . GASTROESOPHAGEAL REFLUX, NO ESOPHAGITIS 03/11/2006  . OSTEOARTHRITIS, MULTI SITES 03/11/2006  . HIGH RISK PATIENT 03/11/2006  . Tobacco dependence 03/11/2006    Past Surgical History:  Procedure Laterality Date  . CARDIOVERSION N/A 09/11/2019   Procedure: CARDIOVERSION;  Surgeon: Jodelle Redhristopher, Bridgette, MD;  [facility];  Service: Cardiovascular;  Laterality: N/A;  . CLIPPING OF ATRIAL APPENDAGE  N/A 05/23/2019   Procedure: CLIPPING OF ATRIAL APPENDAGE with atriclip;  Surgeon: Alleen BorneBartle, Bryan K, MD;  [facility];  Service: Open Heart Surgery;  Laterality: N/A;  . CORONARY ANGIOPLASTY  1995  . CORONARY ANGIOPLASTY WITH STENT PLACEMENT  ~ 1995 - 2004 X 5   "I've got a total of 5 stents" (02/24/2016)  . CORONARY ARTERY BYPASS GRAFT N/A 05/23/2019   Procedure: CORONARY ARTERY BYPASS GRAFTING (CABG), times three, using right greater saphenous vein and left internal mammary;  Surgeon: Alleen BorneBartle, Bryan K, MD;  Location: [facility] OR;  Service: Open Heart Surgery;  Laterality: N/A;  [place] only  . 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 INTRAVASCULAR PRESSURE WIRE/FFR STUDY N/A 05/12/2019   Procedure: INTRAVASCULAR PRESSURE WIRE/FFR STUDY;  Surgeon: Lyn RecordsSmith, Henry W, MD;  Location: [facility] INVASIVE CV LAB;  Service: Cardiovascular;  Laterality: N/A;  . LEFT HEART CATH AND CORONARY ANGIOGRAPHY N/A 05/12/2019   Procedure: LEFT HEART CATH AND CORONARY ANGIOGRAPHY;  Surgeon: Lyn RecordsSmith, Henry W, MD;  Location: [facility] INVASIVE CV LAB;  Service: Cardiovascular;  Laterality: N/A;  . PACEMAKER GENERATOR CHANGE  03/2011    [facility]  . TEE WITHOUT CARDIOVERSION N/A 05/23/2019   Procedure: TRANSESOPHAGEAL ECHOCARDIOGRAM (TEE);  Surgeon: Alleen BorneBartle, Bryan K, MD;  [facility];  Service: Open Heart Surgery;  Laterality: N/A;  . TONSILLECTOMY         Family History  Problem Relation Age of Onset  . Heart attack Mother   . Heart attack Father   . Heart attack Sister [id number]    Social History   Tobacco Use  . Smoking status: Former Smoker    Packs/day: 3.00    Years: 35.00    Pack years: 105.00    Types: Cigarettes    Quit date: 05/22/2019    Years since quitting: 0.7  . Smokeless tobacco: Former NeurosurgeonUser    Quit date: 05/21/2017  Vaping Use  . Vaping Use: Never used  Substance Use Topics  . Alcohol use: Not Currently  . Drug use: Not Currently    Types: Cocaine     Comment: none since 1995    Home Medications Prior to Admission medications   Medication Sig Start Date End Date Taking? Authorizing Provider  acetaminophen (TYLENOL) 325 MG tablet Take 2 tablets (650 mg total) by mouth every 6 (six) hours as needed for mild pain (or Fever >/= 101). 03/05/16   Alison Murrayevine, Alma M, MD  amiodarone (PACERONE) 200 MG tablet Take 2 tablets (400 mg total) by mouth daily. 01/14/20   Dolan AmenWinters, Steven, MD  apixaban (ELIQUIS) 5 MG TABS tablet Take 5 mg by mouth 2 (two) times daily.    [provider]  aspirin 81 MG chewable tablet Chew 1 tablet (81 mg total) by mouth daily. 05/16/19   Kroeger, Ovidio KinKrista M., PA-C  atorvastatin (LIPITOR) 80 MG tablet Take 1 tablet (80 mg total) by mouth daily. Patient taking differently: Take 80 mg  by mouth every evening. 05/15/19   Kroeger, Ovidio Kin., PA-C  carboxymethylcellulose (REFRESH PLUS) 0.5 % SOLN Place 1 drop into both eyes at bedtime.    [provider]  Cholecalciferol (VITAMIN D3) 50 MCG (2000 UT) TABS Take 2,000 Units by mouth daily.    [provider]  furosemide (LASIX) 40 MG tablet Take 40 mg by mouth 2 (two) times daily.     [provider]  gabapentin (NEURONTIN) 100 MG capsule Take 2 capsules (200 mg total) by mouth 2 (two) times daily. 06/01/19   Barrett, Erin R, PA-C  levalbuterol (XOPENEX HFA) 45 MCG/ACT inhaler Inhale 2 puffs into the lungs every 6 (six) hours as needed for wheezing or shortness of breath.    [provider]  levothyroxine (SYNTHROID) 75 MCG tablet Take 75 mcg by mouth daily before breakfast.    [provider]  metoprolol succinate (TOPROL-XL) 25 MG 24 hr tablet Take 12.5 mg by mouth daily.    [provider]  mexiletine (MEXITIL) 150 MG capsule Take 2 capsules (300 mg total) by mouth 2 (two) times daily. 12/07/19   Filbert Schilder, NP  mometasone [facility]) 220 MCG/INH inhaler Inhale 2 puffs into the lungs 2 (two) times daily.    [provider]   montelukast (SINGULAIR) 10 MG tablet Take 10 mg by mouth daily.     [provider]  Multiple Vitamin (MULTIVITAMIN WITH MINERALS) TABS tablet Take 1 tablet by mouth daily.    [provider]  nitroGLYCERIN (NITROSTAT) 0.4 MG SL tablet Place 0.4 mg under the tongue every 5 (five) minutes as needed for chest pain.     [provider]  omega-3 acid ethyl esters (LOVAZA) 1 g capsule Take 1 capsule (1 g total) by mouth 2 (two) times daily. 03/05/16   Alison Murray, MD  omeprazole (PRILOSEC) 20 MG capsule Take 20 mg by mouth 2 (two) times daily before a meal.    [provider]  Propylene Glycol (SYSTANE BALANCE) 0.6 % SOLN Place 1 drop into both eyes See admin instructions. Place 1 drop into each eye four times a day and apply a warm compress for 10 minutes afterwards     [provider]  Tiotropium Bromide-Olodaterol 2.5-2.5 MCG/ACT AERS Inhale 2 puffs into the lungs daily.     [provider]    Allergies    Crestor [rosuvastatin calcium] and Rosuvastatin  Review of Systems   Review of Systems  Respiratory: Positive for shortness of breath.   Cardiovascular: Positive for chest pain.  All other systems reviewed and are negative.   Physical Exam Updated Vital Signs BP (!) 111/59   Pulse (!) 53   Temp (!) 97.5 F (36.4 C) (Oral)   Resp 14   SpO2 100%   Physical Exam Vitals and nursing note reviewed.  Constitutional:      General: He is not in acute distress.    Appearance: Normal appearance. He is well-developed and well-nourished.  HENT:     Head: Normocephalic and atraumatic.     Right Ear: Hearing normal.     Left Ear: Hearing normal.     Nose: Nose normal.     Mouth/Throat:     Mouth: Oropharynx is clear and moist and mucous membranes are normal.  Eyes:     Extraocular Movements: EOM normal.     Conjunctiva/sclera: Conjunctivae normal.     Pupils: Pupils are equal, round, and reactive to light.  Cardiovascular:  Rate and Rhythm: Regular rhythm.     Heart sounds: S1 normal and S2 normal. No murmur heard. No friction rub. No gallop.   Pulmonary:     Effort: Pulmonary effort is normal. No respiratory distress.     Breath sounds: Normal breath sounds.  Chest:     Chest wall: No tenderness.  Abdominal:     General: Bowel sounds are normal.     Palpations: Abdomen is soft. There is no hepatosplenomegaly.     Tenderness: There is no abdominal tenderness. There is no guarding or rebound. Negative signs include Murphy's sign and McBurney's sign.     Hernia: No hernia is present.  Musculoskeletal:        General: Normal range of motion.     Cervical back: Normal range of motion and neck supple.  Skin:    General: Skin is warm, dry and intact.     Findings: No rash.     Nails: There is no cyanosis.  Neurological:     Mental Status: He is alert and oriented to person, place, and time.     GCS: GCS eye subscore is 4. GCS verbal subscore is 5. GCS motor subscore is 6.     Cranial Nerves: No cranial nerve deficit.     Sensory: No sensory deficit.     Coordination: Coordination normal.     Deep Tendon Reflexes: Strength normal.  Psychiatric:        Mood and Affect: Mood and affect normal.        Speech: Speech normal.        Behavior: Behavior normal.        Thought Content: Thought content normal.     ED Results / Procedures / Treatments   Labs (all labs ordered are listed, but only abnormal results are displayed) Labs Reviewed  BASIC METABOLIC PANEL - Abnormal; Notable for the following components:      Result Value   Glucose, Bld 113 (*)    All other components within normal limits  CBC  TROPONIN I (HIGH SENSITIVITY)  TROPONIN I (HIGH SENSITIVITY)  TROPONIN I (HIGH SENSITIVITY)    EKG None  Radiology DG Chest 2 View  Result Date: 02/04/2020 CLINICAL DATA:  Chest pain and shortness of breath. High blood pressure. EXAM: CHEST - 2 VIEW COMPARISON:  01/13/2020 FINDINGS: Postoperative  changes in the mediastinum. Cardiac pacemaker. Cardiac enlargement. Emphysematous changes in the lungs. Diffuse interstitial changes could represent edema or fibrosis. Calcified pleural plaques with blunting of the costophrenic angles likely representing pleural thickening. No change in appearance since previous study. No evidence of superimposed infiltration or consolidation. IMPRESSION: Cardiac enlargement. Emphysematous changes with diffuse interstitial changes suggesting edema or fibrosis. Calcified pleural plaques. Electronically Signed   By: Burman Nieves M.D.   On: 02/04/2020 00:34    Procedures Procedures (including critical care time)  Medications Ordered in ED Medications - No data to display  ED Course  I have reviewed the triage vital signs and the nursing notes.  Pertinent labs & imaging results that were available during my care of the patient were reviewed by me and considered in my medical decision making (see chart for details).    MDM Rules/Calculators/A&P                          Patient presents to the emergency department for evaluation of chest pain.  Patient reportedly had very elevated blood pressure and chest pain at home  earlier tonight, resolved prior to arrival in the department after administration of 2 sublingual nitroglycerin by EMS.  Patient has been pain-free throughout the evaluation here in the emergency department.  Reviewing patient's records reveals he was recently hospitalized with similar.  He did have mild elevation of his troponins during that hospital stay.  This was felt to be secondary to V. tach, cardiology did not recommend any further work-up for the elevated troponins.  Patient does not have troponin elevation here in the department today.  EKG similar to previous.  AICD was interrogated and he did have an episode of V. tach around 9:30 PM.  It is unclear if this coincided with his chest pain but it was around that time.  This V. tach was  apparently self-limited.  No shock given.  He has had 4 episodes of V. tach since January 1.  Because of these episodes of V. tach including 1 around the time he had pain last night, cardiology has been consulted and will see the patient to help determine further treatment and disposition.  Final Clinical Impression(s) / ED Diagnoses Final diagnoses:  Chest pain, unspecified type    Rx / DC Orders ED Discharge Orders    None       Zuzu Befort, Canary Brim, MD 02/04/20 1219    Gilda Crease, MD 02/04/20 [phone]

## 2020-02-04 NOTE — ED Provider Notes (Signed)
Hand off received from Dr. Blinda Leatherwood.  History of nstemi, vtach, presents today with CP now resolved after prehospital nitro Physical Exam  BP 117/60   Pulse (!) 53   Temp (!) 97.5 F (36.4 C) (Oral)   Resp 13   SpO2 99%   Physical Exam  ED Course/Procedures     Procedures  MDM  Patient's interrogated and had v tach [employer] report with nurse. Cards to see and make recc Patient seen by cardiology and have made reccommendations.Patient discharged per cardiology rec with follow up as per Dr. Freda Jackson They plan discharge with op follow up   Margarita Grizzle, MD 02/04/20 1155

## 2020-02-04 NOTE — ED Notes (Signed)
Ambulated patient in the hallway. Patient ambulated well and reported no worsening chest pain with ambulating. Patient did feel increased shortness of breath due to being off oxygen.

## 2020-02-04 NOTE — Discharge Instructions (Addendum)
Follow up as discussed with cardiologist Return if you are worse at any time

## 2020-02-04 NOTE — Consult Note (Addendum)
Cardiology Consultation:   Patient ID: Max Rivas MRN: [id number]; DOB: 1950-12-29  Admit date: 02/04/2020 Date of Consult: 02/04/2020  Primary Care Provider: Anson Fret, MD [facility] Cardiologist: Lesleigh Noe, MD  & [facility] Electrophysiologist:  Lewayne Bunting, MD & [facility] Susie Cassette, MD @ [facility] [address], [place] per [place]   Patient Profile:   Max Rivas is a 70 y.o. male with a hx of coronary disease status post multiple PCIs and CABG in 05/2019 with left atrial appendage clipping, ischemic cardiomyopathy, chronic systolic CHF, recurrent ventricular tachycardia s/p ICD & ablation,  COPD on 4 L oxygen, PAF on Eliquis, and HLD who is being seen today for the evaluation of VT  at the request of Dr. Rosalia Hammers.   Hx of CAD with CABG May 2021 LIMA to LAD SVG diagonal and SVG to OM.  Echo 08/2019 with LVEF of 30-35%, global hypokinesis, grade 1 DD.   At [facility], He underwent EPS/ablation 09/26/19 seems a PVC/VT ablation, his record notes: A comprehensive EP study was performed and patient underwent successful radiofrequency ablation of his arrhythmia, with successful cardioversion. Complicated by R groin hematoma requiring surgical repair 09/27/19 & gen change  09/29/19.   Patient with multiple admission for recurrent VT since then, most recent on 01/13/20. Had chest pain on admission. Device check showed 2 prior episode of VT which "treated successfully". Dr. Ladona Ridgel was concerned about his high dose of amiodarone which reduced to 400mg  daily. Continued Mexiletine 300mg  BID. Previously on Ranolazine.   History of Present Illness:   Max Rivas had sudden onset of palpitations followed by chest pressure across his chest yesterday around 9:30 pm. He was sitting at that time. Does not remember how long palpitations lasted but states that chest pressure was significant (10/10) with tingling in his hands and fingers. He took SL nitro x 1 and called EMS, by the time EMS  arrived his chest pressure improved to 6/10. He was in sinus rhythm at the time of EMS arrival. Given another SL nitro x 1 and his pain resolved by the time he arrived to ER. No recurrent chest pressure since then. He reports chest pressure similar to prior angina when had CABG but less intense. No syncope, orthopnea, PND, LE edema or melena. Reports compliance with medications. Uses 4 L oxygen for COPD 24/7 but intermittently tries to wean off to 3L.    Hs-troponin negative x 3. K 4.2. Scr normal. Hgb 13.5.  Chest X-ray IMPRESSION: Cardiac enlargement. Emphysematous changes with diffuse interstitial changes suggesting edema or fibrosis. Calcified pleural plaques.  Devise interrogation showed  1 treated VT zone episode with 1 ATP on 01/23/20 @ 21:14 - Seem pt did not felt it 4 monitor VT NS episodes, lasting 2 seconds each on 1/13, 1/18, 1/22 @ 13:19 & 1/22 @ 21.26 (around time when pt felt palpitations and pressure)  Patient states that [facility] on 02/15/20 for further discussion of recurrent Vt.   Care Everywhere note by [facility] Susie Cassette, MD  12/20/202: "Patient has a poor prognosis considering the large burden of LV scar. He has para-aortic substrate as well. The options at this point include another attempted ablation or referral to VT center of excellence like [facility]. I will talk to [facility] and see if he has a candidacy for heart transplant or advanced therapies. Medication options may be limited. Reablation attempts may also not be completely successful. I discussed this in detail with  the patient and he expressed understanding. I will discuss with the [facility] and see what best we can do".   Past Medical History:  Diagnosis Date  . Arthritis    "elbows, knees" (02/24/2016)  . Bronchitis 2006  . CAD (coronary artery disease)    a. s/p prior MIs - 1995 x 2, 1998; b. s/p prior LCX stenting; c. 04/2019 Cath: LM min irregs, LAD 65p/mi, D1 75, LCX  99ost/p, 59m (ISR), OM2 80, RCA/RPDA mod diff dzs; d. 05/2019 CABG x 3: LIMA->LAD, VG->Diag, VG->OM.  Marland Kitchen COPD (chronic obstructive pulmonary disease) (HCC)    a. Remote tobacco-->on home O2.  Marland Kitchen GERD (gastroesophageal reflux disease)   . 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 05/2019 Echo: EF 40-45%, gr2 DD. Nl RV size/fxn. Mildly dil LA. Mild MR.  . High cholesterol   . Ischemic cardiomyopathy    a. 05/2019 Echo: EF 40-45%.  . Morbid obesity (HCC)   . Myocardial infarction [facility]) ~ 1995 X 2;1998; 2000; 2004  . On home oxygen therapy    "2L w/activity" (02/24/2016)  . PAF (paroxysmal atrial fibrillation) (HCC)    a. CHA2DS2VASc = 4-->eliquis. Also on amio.  . Pulmonary embolism (HCC) 02/24/2016  . Sleep apnea    "have CPAP; can't tolerate it" (02/24/2016)  . Ventricular tachycardia (HCC)    a. 2005 s/p ICD; b. 03/2011 Device upgrade/lead exchange: MDT Protecta XT VR single lead AICD.    Past Surgical History:  Procedure Laterality Date  . CARDIOVERSION N/A 09/11/2019   Procedure: CARDIOVERSION;  Surgeon: Jodelle Red, MD;  Location: [facility] ENDOSCOPY;  Service: Cardiovascular;  Laterality: N/A;  . CLIPPING OF ATRIAL APPENDAGE N/A 05/23/2019   Procedure: CLIPPING OF ATRIAL APPENDAGE with atriclip;  Surgeon: [name], MD;  Location: [facility] OR;  Service: Open Heart Surgery;  Laterality: N/A;  . CORONARY ANGIOPLASTY  1995  . CORONARY ANGIOPLASTY WITH STENT PLACEMENT  ~ 1995 - 2004 X 5   "I've got a total of 5 stents" (02/24/2016)  . CORONARY ARTERY BYPASS GRAFT N/A 05/23/2019   Procedure: CORONARY ARTERY BYPASS GRAFTING (CABG), times three, using right greater saphenous vein and left internal mammary;  Surgeon: [name], MD;  Location: [facility] OR;  Service: Open Heart Surgery;  Laterality: N/A;  [place] only  . 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 INTRAVASCULAR PRESSURE WIRE/FFR STUDY N/A 05/12/2019    Procedure: INTRAVASCULAR PRESSURE WIRE/FFR STUDY;  Surgeon: Lyn Records, MD;  Location: [facility] INVASIVE CV LAB;  Service: Cardiovascular;  Laterality: N/A;  . LEFT HEART CATH AND CORONARY ANGIOGRAPHY N/A 05/12/2019   Procedure: LEFT HEART CATH AND CORONARY ANGIOGRAPHY;  Surgeon: Lyn Records, MD;  Location: [facility] INVASIVE CV LAB;  Service: Cardiovascular;  Laterality: N/A;  . PACEMAKER GENERATOR CHANGE  03/2011    [facility]  . TEE WITHOUT CARDIOVERSION N/A 05/23/2019   Procedure: TRANSESOPHAGEAL ECHOCARDIOGRAM (TEE);  Surgeon: [name], MD;  Location: [facility] OR;  Service: Open Heart Surgery;  Laterality: N/A;  . TONSILLECTOMY      Inpatient Medications: Scheduled Meds:  Continuous Infusions:  PRN Meds:   Allergies:    Allergies  Allergen Reactions  . Crestor [Rosuvastatin Calcium] Other (See Comments)    Back spasms, but can tolerate it at a low dose now  . Rosuvastatin Other (See Comments)    Back spasms, but can tolerate it at a low  dose now    Social History:   Social History   Socioeconomic History  . Marital status: Divorced    Spouse name: Not on file  . Number of children: Not on file  . Years of education: Not on file  . Highest education level: Not on file  Occupational History  . Not on file  Tobacco Use  . Smoking status: Former Smoker    Packs/day: 3.00    Years: 35.00    Pack years: 105.00    Types: Cigarettes    Quit date: 05/22/2019    Years since quitting: 0.7  . Smokeless tobacco: Former Neurosurgeon    Quit date: 05/21/2017  Vaping Use  . Vaping Use: Never used  Substance and Sexual Activity  . Alcohol use: Not Currently  . Drug use: Not Currently    Types: Cocaine    Comment: none since 1995  . Sexual activity: Yes  Other Topics Concern  . Not on file  Social History Narrative  . Not on file   Social Determinants of Health   Financial Resource Strain: Not on file  Food Insecurity: Not on file  Transportation Needs: Not on file  Physical  Activity: Not on file  Stress: Not on file  Social Connections: Not on file  Intimate Partner Violence: Not on file    Family History:   Family History  Problem Relation Age of Onset  . Heart attack Mother   . Heart attack Father   . Heart attack Sister 82     ROS:  Please see the history of present illness.  All other ROS reviewed and negative.     Physical Exam/Data:   Vitals:   02/04/20 0700 02/04/20 0730 02/04/20 0830 02/04/20 0900  BP: (!) 107/56 117/60 133/63 124/67  Pulse: (!) 51 (!) 53 (!) 50 (!) 57  Resp: 14 13 17 15   Temp:      TempSrc:      SpO2: 99% 99% 100% 100%   No intake or output data in the 24 hours ending 02/04/20 1017 Last 3 Weights 01/14/2020 01/01/2020 01/01/2020  Weight (lbs) 200 lb 9.6 oz 204 lb 12.9 oz 204 lb 12.9 oz  Weight (kg) 90.992 kg 92.9 kg 92.9 kg     There is no height or weight on file to calculate BMI.  General:  Well nourished, well developed, in no acute distress HEENT: normal Lymph: no adenopathy Neck: no JVD Endocrine:  No thryomegaly Vascular: No carotid bruits; FA pulses 2+ bilaterally without bruits  Cardiac:  normal S1, S2; RRR; no murmur  Lungs:  good air movement with expiratory wheezes Abd: soft, nontender, no hepatomegaly  Ext: no edema Musculoskeletal:  No deformities, BUE and BLE strength normal and equal Skin: warm and dry  Neuro:  CNs 2-12 intact, no focal abnormalities noted Psych:  Normal affect   EKG:  The EKG was personally reviewed and demonstrates:  Sinus rhythm at rate of 60 Telemetry:  Telemetry was personally reviewed and demonstrates:  Sinus bradycardia in 50s, PVCs  Relevant CV Studies:  Echo 08/2019 1. Left ventricular ejection fraction, by estimation, is 30 to 35%. The  left ventricle has moderately decreased function. The left ventricle  demonstrates global hypokinesis. The left ventricular internal cavity size  was moderately dilated. Left  ventricular diastolic parameters are consistent with  Grade I diastolic  dysfunction (impaired relaxation).   2. Right ventricular systolic function is moderately reduced. The right  ventricular size is mildly enlarged. There is moderately  elevated  pulmonary artery systolic pressure.   3. Left atrial size was severely dilated.   4. Right atrial size was mildly dilated.   5. The mitral valve is normal in structure. Mild mitral valve  regurgitation. No evidence of mitral stenosis.   6. The aortic valve is normal in structure. Aortic valve regurgitation is  not visualized. No aortic stenosis is present.   7. The inferior vena cava is dilated in size with <50% respiratory  variability, suggesting right atrial pressure of 15 mmHg.    Echo 09/2019 @ [facility] The left ventricle is mildly dilated.  Left ventricular systolic function is moderate to severely reduced.  LV ejection fraction = 25-30%.  Abnormal septal motion noted  Unable to fully assess LV regional wall motion but likely multivessel  wall motion abnormalities  The right ventricle is normal size.  The right ventricular systolic function is mild to moderately reduced.  Device lead in the right ventricle  The left atrium is mildly dilated.  The right atrium is mildly dilated.  There is moderate to severe mitral regurgitation.  The mitral regurgitant jet is eccentrically directed.  Etiology of mitral regurgitation is ischemic heart disease .  There is mild tricuspid regurgitation.  Estimated right ventricular systolic pressure is 57 mmHg.  Estimated right atrial pressure is 10 mmHg..  Moderate pulmonary hypertension.  There is no pericardial effusion.  There is a pleural effusion present.  There is no comparison study available.   -  FINDINGS:  LEFT VENTRICLE  There is normal left ventricular wall thickness. LV indexed volume is  105 ml/m2. The left ventricle is mildly dilated. Left ventricular  systolic function is moderate to severely reduced. LV ejection  fraction =  25-30%. Left ventricular filling pattern is indeterminate.  Unable to fully assess LV regional wall motion. Abnormal septal motion  noted.   -  RIGHT VENTRICLE  Device lead in the right ventricle. The right ventricle is normal  size. The right ventricular systolic function is mild to moderately  reduced.   LEFT ATRIUM  The left atrium is mildly dilated.   RIGHT ATRIUM  The right atrium is mildly dilated.  -  AORTIC VALVE  Structurally normal aortic valve. There is no aortic stenosis. There  is no aortic regurgitation.  -  MITRAL VALVE  There is mild to moderate mitral valve thickening. The mitral  regurgitant jet is eccentrically directed. There is moderate to severe  mitral regurgitation. Etiology of mitral regurgitation is ischemic  heart disease .  -  TRICUSPID VALVE  The tricuspid valve is not well visualized. There is mild tricuspid  regurgitation. Estimated right ventricular systolic pressure is 57  mmHg. Moderate pulmonary hypertension. Estimated right atrial pressure  is 10 mmHg..  -  PULMONIC VALVE  The pulmonic valve is not well visualized.  -  ARTERIES  The aortic sinus is normal size.  -  VENOUS  IVC size was moderately dilated.  -  EFFUSION  There is no pericardial effusion. There is a pleural effusion present.   -  -   MMode/2D Measurements & Calculations  IVSd: 1.1 cm    EDV(MOD-sp4):     ESV(MOD-sp4):       Ao sinus diam:  LVIDd: 5.7 cm   240.4 ml          173.5 ml            3.3 cm  LVPWd: 0.87 cm  EDV(MOD-sp2):  LVIDs: 4.9 cm                     224.9 ml                                    ESV(MOD-sp2):                                    146.2 ml          _______________________________________________________________________  LVOT diam:      SV(MOD-sp4):      IVC 1: 2.6 cm       LA area A2:  2.0 cm          66.8 ml                               22.6 cm2                  SI(MOD-sp4):                  30.6 ml/m2           _______________________________________________________________________  LA area A4:     LA vol: 75.1 ml   LA vol index:       RA area A4:  23.1 cm2                          34.4 ml/m2          25.5 cm2   Doppler Measurements & Calculations  MV E max vel:    MV dec time:     SV(LVOT):       LV V1 VTI: 14.8 cm  141.2 cm/sec     0.16 sec         47.5 ml  MV A max vel:                     Ao V2 max:  50.0 cm/sec                       119.7 cm/sec  MV E/A: 2.8                       Ao max PG:  Med Peak E' Vel:                  5.7 mmHg  5.6 cm/sec                        Ao V2 mean:  Lat Peak E' Vel:                  76.3 cm/sec  6.2 cm/sec                        Ao mean PG:  E/Lat E`: 22.8                    2.6 mmHg  E/Med E`: 25.1                    Ao V2 VTI:  20.1 cm                                    AVA (VTI):                                     2.4 cm2          _______________________________________________________________________  MR ERO: 0.38 cm2 TR max vel:      RAP systole:    AS Dimensionless  MR PISA radius:  311.0 cm/sec     8.0 mmHg        Index (VTI): 0.74  1.1 cm           TR max PG:  MR alias vel:    38.7 mmHg  26.0 cm/sec      RVSP(TR):                   46.7 mmHg          _______________________________________________________________________  AVAi(VTI)        SV index(LVOT):  cm^2/m^2: 1.1 cm221.8 ml/m2    Laboratory Data:  High Sensitivity Troponin:   Recent Labs  Lab 01/13/20 0237 01/13/20 0422 02/04/20 0021 02/04/20 0255 02/04/20 0622  TROPONINIHS 93* 65* 10 13 11      Chemistry Recent Labs  Lab 02/04/20 0021  NA 141  K 4.2  CL 102  CO2 26  GLUCOSE 113*  BUN 13  CREATININE 1.18  CALCIUM 9.1  GFRNONAA >60  ANIONGAP 13    Hematology Recent Labs  Lab 02/04/20 0021  WBC 8.0  RBC 4.55  HGB 13.5  HCT 42.7  MCV 93.8  MCH 29.7  MCHC 31.6  RDW 14.0  PLT 167   Radiology/Studies:  DG Chest 2  View  Result Date: 02/04/2020 CLINICAL DATA:  Chest pain and shortness of breath. High blood pressure. EXAM: CHEST - 2 VIEW COMPARISON:  01/13/2020 FINDINGS: Postoperative changes in the mediastinum. Cardiac pacemaker. Cardiac enlargement. Emphysematous changes in the lungs. Diffuse interstitial changes could represent edema or fibrosis. Calcified pleural plaques with blunting of the costophrenic angles likely representing pleural thickening. No change in appearance since previous study. No evidence of superimposed infiltration or consolidation. IMPRESSION: Cardiac enlargement. Emphysematous changes with diffuse interstitial changes suggesting edema or fibrosis. Calcified pleural plaques. Electronically Signed   By: 03/12/2020 M.D.   On: 02/04/2020 00:34   Assessment and Plan:   1. Recurrent VT s/p ICD and ablation 09/2019 - Multiple admission for VT, most recent on 01/13/20. Seen by Dr. 03/12/20 and reduce amiodarone.  - Devise interrogation showed  1 treated VT zone episode with 1 ATP on 01/23/20 @ 21:14 - Seem pt did not felt it 4 monitor VT NS episodes, lasting 2 seconds each on 1/13, 1/18, 1/22 @ 13:19 & 1/22 @ 21.26 (around time when pt felt palpitations and pressure) - [facility], MD on 02/15/20 for further discussion of recurrent VT - TSH was normal on 12/31/19  2. Chest pressure with hx of CAD s/p CABG  - Had chest pressure with last VT admission on 01/13/20 as well.  - Yesterday again had chest pressure while had 2 sec of VT. Associated with tingling of hands and fingers. Chest pressure similar to prior angina when had CABG but less intensity. Resolved after SL  nitro x 2. Patient has been ruled out of ACS. Troponin negative x 3. EKG without acute ischemic changes.   3. Chronic systolic CHF - EF 30-35 on 08/2019 % 25-30% on 09/2019 @ [facility] - Euvolemic - Continue Toprol XL 12.5mg  qd (unable to titrate further 2nd to baseline  bradycardia) -Continue home lasix 40mg  BID - ? Why not on advanced HF medications. Could be soft blood pressure.   4. PAF - Maintaining sinus rhythm -  On Amiodarone and BB - On Eliquis for anticoagulation  - Denies bleeding issue  5. COPD on 4 L oxygen - No active wheezing   Risk Assessment/Risk Scores:   HEAR Score (for undifferentiated chest pain):  HEAR Score: 5{  New York Heart Association (NYHA) Functional Class NYHA Class II  CHA2DS2-VASc Score = 4  This indicates a 4.8% annual risk of stroke. The patient's score is based upon: CHF History: Yes HTN History: Yes Diabetes History: No Stroke History: No Vascular Disease History: Yes Age Score: 1 Gender Score: 0   For questions or updates, please contact [facility] Please consult [email or web address] for contact info under    Lorelei PontSigned, Bhavinkumar Bhagat, PA  02/04/2020 10:17 AM  Personally seen and examined. Agree with APP above with edits and the following comments: Briefly 70 yo M with CAD and HFrEF with refractory VT s/p Complicated VT ablation with R groin complications and gen change requirement.  On Amiodarone and mexiletine, on low dose BB (suspect not increase for concern for frequent pacing) and formerly on Ranexa (stoped by [facility], possibly related to QTc prolongation).  Patient notes that he only felt short burst of chest pressure that coincides with his VT.  Notes that he took his AAD therapy late and thinks this may have been the issue.  Does not recall the prior shock. Exam notable for baseline expiratory bilateral wheezes, slow, regular heart rate, PPM site is c/d/i Labs notable for no elevation in hs-troponin Personally reviewed relevant tests; episode 95 shows VT; though strip does not show ATP; only resolution; lack of shocking during last event also appropriate, stable thoracic impedance SHARED DECISION MAKING:  70 yo gentleman with refractory VT who has close follow up with his primary EP and team to  discussion Needle Ablation and [facility] vs heart transplant evaluation in the setting of significant COPD.  Patient would like to leave; given that he thinks taking his medication late may have been the cause.  Discussed with him that given his heart condition he could realistically have VT at anytime; and there are only limited AAD therapies we would have to offer.Reasonable for ED DC; device is working properly.  If second event would need admission and increase in BB with increase in device back up rate.  A re-trial of Ranexa with Qtc monitoring is an option, as is quinidine, but these are very limited in nature. Patient understands the risks and benefits of leaving and is ready to go home.  [facility].  Reached out to patient's primary electrophysiologist outside of the institution.  Christell ConstantMahesh A Srihitha Tagliaferri, MD

## 2020-02-04 NOTE — ED Triage Notes (Addendum)
Pt was brought in from ems and from home and was having chest pain, with SOB AND with high blood pressure. Upon ems arrival he was given 2 nitro for his pain, Pt said his chest pain is gone now. Pt said he is on 4 Liters of O2 at all times.nO sob NOW EITHER. pT HAS HX OF bypass may 2021

## 2020-02-09 DIAGNOSIS — J449 Chronic obstructive pulmonary disease, unspecified: Secondary | ICD-10-CM | POA: Diagnosis not present

## 2020-02-13 ENCOUNTER — Ambulatory Visit: Payer: Medicare Other | Admitting: Nurse Practitioner

## 2020-02-21 NOTE — Progress Notes (Deleted)
Cardiology Office Note    Date:  02/21/2020   ID:  Max Rivas, DOB Nov 11, 1950, MRN [id number]  PCP:  Anson Fret, MD  Cardiologist: Lesleigh Noe, MD EPS: Lewayne Bunting, MD  No chief complaint on file.   History of Present Illness:  Max Rivas is a 70 y.o. male ***    Past Medical History:  Diagnosis Date  . Arthritis    "elbows, knees" (02/24/2016)  . Bronchitis 2006  . CAD (coronary artery disease)    a. s/p prior MIs - 1995 x 2, 1998; b. s/p prior LCX stenting; c. 04/2019 Cath: LM min irregs, LAD 65p/mi, D1 75, LCX 99ost/p, 46m (ISR), OM2 80, RCA/RPDA mod diff dzs; d. 05/2019 CABG x 3: LIMA->LAD, VG->Diag, VG->OM.  Marland Kitchen COPD (chronic obstructive pulmonary disease) (HCC)    a. Remote tobacco-->on home O2.  Marland Kitchen GERD (gastroesophageal reflux disease)   . 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 05/2019 Echo: EF 40-45%, gr2 DD. Nl RV size/fxn. Mildly dil LA. Mild MR.  . High cholesterol   . Ischemic cardiomyopathy    a. 05/2019 Echo: EF 40-45%.  . Morbid obesity (HCC)   . Myocardial infarction [facility]) ~ 1995 X 2;1998; 2000; 2004  . On home oxygen therapy    "2L w/activity" (02/24/2016)  . PAF (paroxysmal atrial fibrillation) (HCC)    a. CHA2DS2VASc = 4-->eliquis. Also on amio.  . Pulmonary embolism (HCC) 02/24/2016  . Sleep apnea    "have CPAP; can't tolerate it" (02/24/2016)  . Ventricular tachycardia (HCC)    a. 2005 s/p ICD; b. 03/2011 Device upgrade/lead exchange: MDT Protecta XT VR single lead AICD.    Past Surgical History:  Procedure Laterality Date  . CARDIOVERSION N/A 09/11/2019   Procedure: CARDIOVERSION;  Surgeon: Jodelle Red, MD;  Location: [facility] ENDOSCOPY;  Service: Cardiovascular;  Laterality: N/A;  . CLIPPING OF ATRIAL APPENDAGE N/A 05/23/2019   Procedure: CLIPPING OF ATRIAL APPENDAGE with atriclip;  Surgeon: [name], MD;  Location: [facility] OR;  Service: Open Heart Surgery;  Laterality: N/A;  . CORONARY  ANGIOPLASTY  1995  . CORONARY ANGIOPLASTY WITH STENT PLACEMENT  ~ 1995 - 2004 X 5   "I've got a total of 5 stents" (02/24/2016)  . CORONARY ARTERY BYPASS GRAFT N/A 05/23/2019   Procedure: CORONARY ARTERY BYPASS GRAFTING (CABG), times three, using right greater saphenous vein and left internal mammary;  Surgeon: [name], MD;  [facility];  Service: Open Heart Surgery;  Laterality: N/A;  [place] only  . 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 INTRAVASCULAR PRESSURE WIRE/FFR STUDY N/A 05/12/2019   Procedure: INTRAVASCULAR PRESSURE WIRE/FFR STUDY;  Surgeon: Lyn Records, MD;  [facility]: [facility] INVASIVE CV LAB;  Service: Cardiovascular;  Laterality: N/A;  . LEFT HEART CATH AND CORONARY ANGIOGRAPHY N/A 05/12/2019   Procedure: LEFT HEART CATH AND CORONARY ANGIOGRAPHY;  Surgeon: Lyn Records, MD;  [facility] INVASIVE CV LAB;  Service: Cardiovascular;  Laterality: N/A;  . PACEMAKER GENERATOR CHANGE  [facility]  . TEE WITHOUT CARDIOVERSION N/A 05/23/2019   Procedure: TRANSESOPHAGEAL ECHOCARDIOGRAM (TEE);  Surgeon: [name], MD;  Location: [facility] OR;  Service: Open Heart Surgery;  Laterality: N/A;  . TONSILLECTOMY      Current Medications: No outpatient medications have been marked as taking for the 02/27/20 encounter (Appointment) with Dyann Kief,  PA-C.     Allergies:   Crestor [rosuvastatin calcium] and Rosuvastatin   Social History   Socioeconomic History  . Marital status: Divorced    Spouse name: Not on file  . Number of children: Not on file  . Years of education: Not on file  . Highest education level: Not on file  Occupational History  . Not on file  Tobacco Use  . Smoking status: Former Smoker    Packs/day: 3.00    Years: 35.00    Pack years: 105.00    Types: Cigarettes    Quit date: 05/22/2019    Years since quitting: 0.7  . Smokeless tobacco: Former Neurosurgeon    Quit date: 05/21/2017  Vaping Use  .  Vaping Use: Never used  Substance and Sexual Activity  . Alcohol use: Not Currently  . Drug use: Not Currently    Types: Cocaine    Comment: none since 1995  . Sexual activity: Yes  Other Topics Concern  . Not on file  Social History Narrative  . Not on file   Social Determinants of Health   Financial Resource Strain: Not on file  Food Insecurity: Not on file  Transportation Needs: Not on file  Physical Activity: Not on file  Stress: Not on file  Social Connections: Not on file     Family History:  The patient's ***family history includes Heart attack in his father and mother; Heart attack (age of onset: 59) in his sister.   ROS:   Please see the history of present illness.    ROS All other systems reviewed and are negative.   PHYSICAL EXAM:   VS:  There were no vitals taken for this visit.  Physical Exam  GEN: Well nourished, well developed, in no acute distress  HEENT: normal  Neck: no JVD, carotid bruits, or masses Cardiac:RRR; no murmurs, rubs, or gallops  Respiratory:  clear to auscultation bilaterally, normal work of breathing GI: soft, nontender, nondistended, + BS Ext: without cyanosis, clubbing, or edema, Good distal pulses bilaterally MS: no deformity or atrophy  Skin: warm and dry, no rash Neuro:  Alert and Oriented x 3, Strength and sensation are intact Psych: euthymic mood, full affect  Wt Readings from Last 3 Encounters:  01/14/20 200 lb 9.6 oz (91 kg)  01/01/20 204 lb 12.9 oz (92.9 kg)  12/07/19 204 lb 2.3 oz (92.6 kg)      Studies/Labs Reviewed:   EKG:  EKG is*** ordered today.  The ekg ordered today demonstrates ***  Recent Labs: 12/31/2019: TSH 1.630 01/13/2020: B Natriuretic Peptide 245.1 01/14/2020: ALT 18 02/04/2020: BUN 13; Creatinine, Ser 1.18; Hemoglobin 13.5; Magnesium 2.2; Platelets 167; Potassium 4.2; Sodium 141   Lipid Panel    Component Value Date/Time   CHOL 126 05/13/2019 0612   TRIG 38 09/19/2019 0223   HDL 36 (L) 05/13/2019  0612   CHOLHDL 3.5 05/13/2019 0612   VLDL 20 05/13/2019 0612   LDLCALC 70 05/13/2019 0612    Additional studies/ records that were reviewed today include:  ***   Risk Assessment/Calculations:   {Does this patient have ATRIAL FIBRILLATION?[phone]}     ASSESSMENT:    No diagnosis found.   PLAN:  In order of problems listed above:    Shared Decision Making/Informed Consent   {Are you ordering a CV Procedure (e.g. stress test, cath, DCCV, TEE, etc)?   Press F2        :[phone]}    Medication Adjustments/Labs and Tests Ordered: Current medicines  are reviewed at length with the patient today.  Concerns regarding medicines are outlined above.  Medication changes, Labs and Tests ordered today are listed in the Patient Instructions below. There are no Patient Instructions on file for this visit.   Signed, Jacolyn Reedy, PA-C  02/21/2020 2:50 PM    [facility] [address] [place], [place], [place]  [address] Phone: [phone]; Fax: [phone]

## 2020-02-27 ENCOUNTER — Ambulatory Visit: Payer: Medicare Other | Admitting: Physician Assistant

## 2020-03-11 DIAGNOSIS — J449 Chronic obstructive pulmonary disease, unspecified: Secondary | ICD-10-CM | POA: Diagnosis not present

## 2020-03-18 ENCOUNTER — Emergency Department (HOSPITAL_COMMUNITY): Payer: Medicare Other

## 2020-03-18 ENCOUNTER — Observation Stay (HOSPITAL_COMMUNITY)
Admission: EM | Admit: 2020-03-18 | Discharge: 2020-03-19 | Disposition: A | Discharge status: Home / Self Care | Payer: Medicare Other | Attending: Cardiology | Admitting: Cardiology

## 2020-03-18 DIAGNOSIS — Z87891 Personal history of nicotine dependence: Secondary | ICD-10-CM | POA: Insufficient documentation

## 2020-03-18 DIAGNOSIS — Z950 Presence of cardiac pacemaker: Secondary | ICD-10-CM | POA: Insufficient documentation

## 2020-03-18 DIAGNOSIS — E039 Hypothyroidism, unspecified: Secondary | ICD-10-CM | POA: Diagnosis not present

## 2020-03-18 DIAGNOSIS — N189 Chronic kidney disease, unspecified: Secondary | ICD-10-CM | POA: Diagnosis not present

## 2020-03-18 DIAGNOSIS — Z7901 Long term (current) use of anticoagulants: Secondary | ICD-10-CM | POA: Diagnosis not present

## 2020-03-18 DIAGNOSIS — I130 Hypertensive heart and chronic kidney disease with heart failure and stage 1 through stage 4 chronic kidney disease, or unspecified chronic kidney disease: Secondary | ICD-10-CM | POA: Diagnosis not present

## 2020-03-18 DIAGNOSIS — R110 Nausea: Secondary | ICD-10-CM | POA: Diagnosis not present

## 2020-03-18 DIAGNOSIS — J439 Emphysema, unspecified: Secondary | ICD-10-CM | POA: Diagnosis not present

## 2020-03-18 DIAGNOSIS — I10 Essential (primary) hypertension: Secondary | ICD-10-CM | POA: Diagnosis not present

## 2020-03-18 DIAGNOSIS — R002 Palpitations: Secondary | ICD-10-CM | POA: Diagnosis not present

## 2020-03-18 DIAGNOSIS — I472 Ventricular tachycardia, unspecified: Secondary | ICD-10-CM

## 2020-03-18 DIAGNOSIS — Z9981 Dependence on supplemental oxygen: Secondary | ICD-10-CM | POA: Diagnosis not present

## 2020-03-18 DIAGNOSIS — Z79899 Other long term (current) drug therapy: Secondary | ICD-10-CM | POA: Insufficient documentation

## 2020-03-18 DIAGNOSIS — I5023 Acute on chronic systolic (congestive) heart failure: Secondary | ICD-10-CM | POA: Diagnosis not present

## 2020-03-18 DIAGNOSIS — Z20822 Contact with and (suspected) exposure to covid-19: Secondary | ICD-10-CM | POA: Diagnosis not present

## 2020-03-18 DIAGNOSIS — I499 Cardiac arrhythmia, unspecified: Secondary | ICD-10-CM | POA: Diagnosis not present

## 2020-03-18 DIAGNOSIS — I2510 Atherosclerotic heart disease of native coronary artery without angina pectoris: Secondary | ICD-10-CM | POA: Insufficient documentation

## 2020-03-18 DIAGNOSIS — R000 Tachycardia, unspecified: Secondary | ICD-10-CM | POA: Diagnosis not present

## 2020-03-18 DIAGNOSIS — J449 Chronic obstructive pulmonary disease, unspecified: Secondary | ICD-10-CM | POA: Diagnosis not present

## 2020-03-18 DIAGNOSIS — Z7982 Long term (current) use of aspirin: Secondary | ICD-10-CM | POA: Diagnosis not present

## 2020-03-18 LAB — CBC WITH DIFFERENTIAL/PLATELET
Abs Immature Granulocytes: 0.04 10*3/uL (ref 0.00–0.07)
Basophils Absolute: 0 10*3/uL (ref 0.0–0.1)
Basophils Relative: 1 %
Eosinophils Absolute: 0.1 10*3/uL (ref 0.0–0.5)
Eosinophils Relative: 1 %
HCT: 44.4 % (ref 39.0–52.0)
Hemoglobin: 14.7 g/dL (ref 13.0–17.0)
Immature Granulocytes: 1 %
Lymphocytes Relative: 25 %
Lymphs Abs: 2.2 10*3/uL (ref 0.7–4.0)
MCH: 30.3 pg (ref 26.0–34.0)
MCHC: 33.1 g/dL (ref 30.0–36.0)
MCV: 91.5 fL (ref 80.0–100.0)
Monocytes Absolute: 0.9 10*3/uL (ref 0.1–1.0)
Monocytes Relative: 11 %
Neutro Abs: 5.4 10*3/uL (ref 1.7–7.7)
Neutrophils Relative %: 62 %
Platelets: 162 10*3/uL (ref 150–400)
RBC: 4.85 MIL/uL (ref 4.22–5.81)
RDW: 14.4 % (ref 11.5–15.5)
WBC: 8.6 10*3/uL (ref 4.0–10.5)
nRBC: 0 % (ref 0.0–0.2)
nRBC: 0 /100 WBC

## 2020-03-18 LAB — COMPREHENSIVE METABOLIC PANEL
ALT: 30 U/L (ref 0–44)
AST: 33 U/L (ref 15–41)
Albumin: 3.9 g/dL (ref 3.5–5.0)
Alkaline Phosphatase: 71 U/L (ref 38–126)
Anion gap: 13 (ref 5–15)
BUN: 16 mg/dL (ref 8–23)
CO2: 28 mmol/L (ref 22–32)
Calcium: 9.6 mg/dL (ref 8.9–10.3)
Chloride: 99 mmol/L (ref 98–111)
Creatinine, Ser: 1.55 mg/dL — ABNORMAL HIGH (ref 0.61–1.24)
GFR, Estimated: 49 mL/min — ABNORMAL LOW (ref >60)
Glucose, Bld: 126 mg/dL — ABNORMAL HIGH (ref 70–99)
Potassium: 4.3 mmol/L (ref 3.5–5.1)
Sodium: 140 mmol/L (ref 135–145)
Total Bilirubin: 0.7 mg/dL (ref 0.3–1.2)
Total Protein: 7.3 g/dL (ref 6.5–8.1)

## 2020-03-18 LAB — RESP PANEL BY RT-PCR (FLU A&B, COVID) ARPGX2
Influenza A by PCR: NEGATIVE
Influenza B by PCR: NEGATIVE
SARS Coronavirus 2 by RT PCR: NEGATIVE

## 2020-03-18 LAB — TROPONIN I (HIGH SENSITIVITY)
Troponin I (High Sensitivity): 16 ng/L (ref <18)
Troponin I (High Sensitivity): 21 ng/L — ABNORMAL HIGH (ref <18)

## 2020-03-18 LAB — MAGNESIUM: Magnesium: 2.2 mg/dL (ref 1.7–2.4)

## 2020-03-18 LAB — TSH: TSH: 3.102 u[IU]/mL (ref 0.350–4.500)

## 2020-03-18 MED ORDER — SODIUM CHLORIDE 0.9% FLUSH
3.0000 mL | Freq: Two times a day (BID) | INTRAVENOUS | Status: DC
  Administered 2020-03-19: 3 mL via INTRAVENOUS

## 2020-03-18 MED ORDER — MEXILETINE HCL 150 MG PO CAPS
150.0000 mg | ORAL_CAPSULE | Freq: Two times a day (BID) | ORAL | Status: DC
  Administered 2020-03-18 – 2020-03-19 (×2): 150 mg via ORAL
  Filled 2020-03-18 (×3): qty 1

## 2020-03-18 MED ORDER — APIXABAN 5 MG PO TABS
5.0000 mg | ORAL_TABLET | Freq: Two times a day (BID) | ORAL | Status: DC
  Administered 2020-03-18 – 2020-03-19 (×2): 5 mg via ORAL
  Filled 2020-03-18 (×2): qty 1

## 2020-03-18 MED ORDER — AMIODARONE HCL IN DEXTROSE 360-4.14 MG/200ML-% IV SOLN
30.0000 mg/h | INTRAVENOUS | Status: AC
  Administered 2020-03-18 – 2020-03-19 (×2): 30 mg/h via INTRAVENOUS
  Filled 2020-03-18 (×3): qty 200

## 2020-03-18 MED ORDER — VITAMIN D 25 MCG (1000 UNIT) PO TABS
2000.0000 [IU] | ORAL_TABLET | Freq: Every day | ORAL | Status: DC
  Administered 2020-03-18 – 2020-03-19 (×2): 2000 [IU] via ORAL
  Filled 2020-03-18 (×2): qty 2

## 2020-03-18 MED ORDER — ROSUVASTATIN CALCIUM 20 MG PO TABS
20.0000 mg | ORAL_TABLET | Freq: Every day | ORAL | Status: DC
  Administered 2020-03-18 – 2020-03-19 (×2): 20 mg via ORAL
  Filled 2020-03-18 (×2): qty 1

## 2020-03-18 MED ORDER — PANTOPRAZOLE SODIUM 40 MG PO TBEC
40.0000 mg | DELAYED_RELEASE_TABLET | Freq: Every day | ORAL | Status: DC
  Administered 2020-03-18 – 2020-03-19 (×2): 40 mg via ORAL
  Filled 2020-03-18 (×2): qty 1

## 2020-03-18 MED ORDER — ALUM & MAG HYDROXIDE-SIMETH 200-200-20 MG/5ML PO SUSP: 30.0000 mL | ORAL | Status: DC | PRN

## 2020-03-18 MED ORDER — SODIUM CHLORIDE 0.9 % IV SOLN: 250.0000 mL | INTRAVENOUS | Status: DC | PRN

## 2020-03-18 MED ORDER — GABAPENTIN 100 MG PO CAPS
200.0000 mg | ORAL_CAPSULE | Freq: Two times a day (BID) | ORAL | Status: DC
  Administered 2020-03-18 – 2020-03-19 (×2): 200 mg via ORAL
  Filled 2020-03-18 (×2): qty 2

## 2020-03-18 MED ORDER — OMEGA-3-ACID ETHYL ESTERS 1 G PO CAPS
1.0000 g | ORAL_CAPSULE | Freq: Two times a day (BID) | ORAL | Status: DC
  Administered 2020-03-18 – 2020-03-19 (×2): 1 g via ORAL
  Filled 2020-03-18 (×2): qty 1

## 2020-03-18 MED ORDER — ASPIRIN 81 MG PO CHEW
81.0000 mg | CHEWABLE_TABLET | Freq: Every day | ORAL | Status: DC
  Administered 2020-03-19: 81 mg via ORAL
  Filled 2020-03-18: qty 1

## 2020-03-18 MED ORDER — ONDANSETRON HCL 4 MG/2ML IJ SOLN: 4.0000 mg | Freq: Four times a day (QID) | INTRAMUSCULAR | Status: DC | PRN

## 2020-03-18 MED ORDER — ADULT MULTIVITAMIN W/MINERALS CH
1.0000 | ORAL_TABLET | Freq: Every day | ORAL | Status: DC
  Administered 2020-03-19: 1 via ORAL
  Filled 2020-03-18 (×2): qty 1

## 2020-03-18 MED ORDER — MONTELUKAST SODIUM 10 MG PO TABS
10.0000 mg | ORAL_TABLET | Freq: Every day | ORAL | Status: DC
  Administered 2020-03-18 – 2020-03-19 (×2): 10 mg via ORAL
  Filled 2020-03-18 (×2): qty 1

## 2020-03-18 MED ORDER — AMIODARONE HCL IN DEXTROSE 360-4.14 MG/200ML-% IV SOLN
60.0000 mg/h | INTRAVENOUS | Status: AC
  Administered 2020-03-18 (×2): 60 mg/h via INTRAVENOUS
  Filled 2020-03-18: qty 200

## 2020-03-18 MED ORDER — HYPROMELLOSE (GONIOSCOPIC) 2.5 % OP SOLN
1.0000 [drp] | Freq: Four times a day (QID) | OPHTHALMIC | Status: DC
  Administered 2020-03-18 – 2020-03-19 (×3): 1 [drp] via OPHTHALMIC
  Filled 2020-03-18: qty 15

## 2020-03-18 MED ORDER — MOMETASONE FUROATE 220 MCG/INH IN AEPB: 2.0000 | INHALATION_SPRAY | Freq: Two times a day (BID) | RESPIRATORY_TRACT | Status: DC

## 2020-03-18 MED ORDER — ACETAMINOPHEN 500 MG PO TABS: 500.0000 mg | ORAL_TABLET | Freq: Four times a day (QID) | ORAL | Status: DC | PRN

## 2020-03-18 MED ORDER — BUDESONIDE 0.5 MG/2ML IN SUSP
1.0000 mg | Freq: Two times a day (BID) | RESPIRATORY_TRACT | Status: DC
  Filled 2020-03-18: qty 4

## 2020-03-18 MED ORDER — NITROGLYCERIN 0.4 MG SL SUBL: 0.4000 mg | SUBLINGUAL_TABLET | SUBLINGUAL | Status: DC | PRN

## 2020-03-18 MED ORDER — ALBUTEROL SULFATE (2.5 MG/3ML) 0.083% IN NEBU: 2.5000 mg | INHALATION_SOLUTION | Freq: Four times a day (QID) | RESPIRATORY_TRACT | Status: DC | PRN

## 2020-03-18 MED ORDER — ATORVASTATIN CALCIUM 40 MG PO TABS: 40.0000 mg | ORAL_TABLET | Freq: Every evening | ORAL | Status: DC

## 2020-03-18 MED ORDER — METOPROLOL SUCCINATE ER 25 MG PO TB24
12.5000 mg | ORAL_TABLET | Freq: Every day | ORAL | Status: DC
  Administered 2020-03-18 – 2020-03-19 (×2): 12.5 mg via ORAL
  Filled 2020-03-18 (×2): qty 1

## 2020-03-18 MED ORDER — LEVOTHYROXINE SODIUM 75 MCG PO TABS
75.0000 ug | ORAL_TABLET | Freq: Every day | ORAL | Status: DC
  Administered 2020-03-19: 75 ug via ORAL
  Filled 2020-03-18: qty 1

## 2020-03-18 MED ORDER — FUROSEMIDE 40 MG PO TABS
40.0000 mg | ORAL_TABLET | Freq: Two times a day (BID) | ORAL | Status: DC
  Administered 2020-03-18 – 2020-03-19 (×2): 40 mg via ORAL
  Filled 2020-03-18 (×2): qty 1

## 2020-03-18 MED ORDER — SODIUM CHLORIDE 0.9% FLUSH: 3.0000 mL | INTRAVENOUS | Status: DC | PRN

## 2020-03-18 NOTE — ED Triage Notes (Signed)
Pt comes from home via EMS reports his defib fired x2 at home. Denies cp, sob, felt slight nausea this morning, and felt palpitations. EMS reports a-fib rate 60-110. Maintained until approx 3 min from hospital pt had sustained v-tach, defib fired converted for a few seconds then back into v-tach rate 140-150. 18g IV established in LAC. Pt given 150mg .  amiodarone infusion initiated upon arrival to ED. Pt in a-fib at this time 94-99. Pt a/o x4 . BP VNL

## 2020-03-18 NOTE — H&P (Addendum)
ELECTROPHYSIOLOGY H&P NOTE    Patient ID: Max Rivas MRN: [id number], DOB/AGE: [birth year] 70 y.o.  Admit date: 03/18/2020 Date of Consult: 03/18/2020  Primary Physician: Anson Fret, MD Primary Cardiologist: Dr. Jillyn Hidden [facility] Kathryne Sharper) Electrophysiologist: Dr. Ladona Ridgel >>> Dr. Rhea Belton Shantha [facility])  Reason for admission: VT  Patient Profile:  Max Rivas is a 70 y.o. 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 ICM with EF 40-45%. History of PAF on eliquis with recent [facility] 8/31 for afib. History of single lead AICD and recurrent VT RX with oral amiodarone and mexiletine who is being seen today for the evaluation of VT at the request of Dr. Lockie Mola.  HPI:  Max Rivas is a 70 y.o. male with medical history as above.   He underwent EPS/ablation 09/26/19 seems a PVC/VT ablation, his record notes: A comprehensive EP study was performed and patient underwent successful radiofrequency ablation of his arrhythmia, with successful cardioversion Complicated by R groin hematoma requiring surgical repair 09/27/19 09/29/19 had gen change Discharge 10/03/19 Discharge Diagnoses:  ICD fires for VT s/p PVC/VT ablation Right femoral artery pseudoaneurysm ICD ERI s/p generator change   He has a long hx of VT and ICD therapies (appropriate)  Currently he is feeling well. He was last seen by Dr. Okey Regal in December. Noted plans for potentially referring for a "special type of ablation" in [place], [place].   Pt reports being in his USOH up until this am. He was preparing to take his medications when he received a shock from his ICD. This is his first shock since December. He has had numerous episodes of ATP today.  He denies any symptoms of worsening SOB, edema, CP, lightheadedness, dizziness, or syncope. He has not missed any of his medications. He is "always told that my potassium is low".    Pertinent labs on admission include:  K 4.3, Cr 1.55 (mild AK), HS troponin 16, WBC 8.6, Hgb 14.7, COVID/Flu negative.   He denies any s/s of ischemia.   Device information MDT single chamber implanted/new RV lead and device3/07/2011 >> gen change 09/29/2019 Initial implant 2005(RV lead was 6949 lead, is abandoned)  + hx of appropriate therapies AAD amiodarone (on historically) and restarted March 2021  Past Medical History:  Diagnosis Date  . Arthritis    "elbows, knees" (02/24/2016)  . Bronchitis 2006  . CAD (coronary artery disease)    a. s/p prior MIs - 1995 x 2, 1998; b. s/p prior LCX stenting; c. 04/2019 Cath: LM min irregs, LAD 65p/mi, D1 75, LCX 99ost/p, 23m (ISR), OM2 80, RCA/RPDA mod diff dzs; d. 05/2019 CABG x 3: LIMA->LAD, VG->Diag, VG->OM.  Marland Kitchen COPD (chronic obstructive pulmonary disease) (HCC)    a. Remote tobacco-->on home O2.  Marland Kitchen GERD (gastroesophageal reflux disease)   . 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 05/2019 Echo: EF 40-45%, gr2 DD. Nl RV size/fxn. Mildly dil LA. Mild MR.  . High cholesterol   . Ischemic cardiomyopathy    a. 05/2019 Echo: EF 40-45%.  . Morbid obesity (HCC)   . Myocardial infarction [facility]) ~ 1995 X 2;1998; 2000; 2004  . On home oxygen therapy    "2L w/activity" (02/24/2016)  . PAF (paroxysmal atrial fibrillation) (HCC)    a. CHA2DS2VASc = 4-->eliquis. Also on amio.  . Pulmonary embolism (HCC) 02/24/2016  . Sleep apnea    "  have CPAP; can't tolerate it" (02/24/2016)  . Ventricular tachycardia (HCC)    a. 2005 s/p ICD; b. 03/2011 Device upgrade/lead exchange: MDT Protecta XT VR single lead AICD.     Surgical History:  Past Surgical History:  Procedure Laterality Date  . CARDIOVERSION N/A 09/11/2019   Procedure: CARDIOVERSION;  Surgeon: Jodelle Red, MD;  Location: [facility] ENDOSCOPY;  Service: Cardiovascular;  Laterality: N/A;  . CLIPPING OF ATRIAL APPENDAGE N/A 05/23/2019   Procedure: CLIPPING OF ATRIAL  APPENDAGE with atriclip;  Surgeon: [name], MD;  Location: [facility] OR;  Service: Open Heart Surgery;  Laterality: N/A;  . CORONARY ANGIOPLASTY  1995  . CORONARY ANGIOPLASTY WITH STENT PLACEMENT  ~ 1995 - 2004 X 5   "I've got a total of 5 stents" (02/24/2016)  . CORONARY ARTERY BYPASS GRAFT N/A 05/23/2019   Procedure: CORONARY ARTERY BYPASS GRAFTING (CABG), times three, using right greater saphenous vein and left internal mammary;  Surgeon: [name], MD;  Location: [facility] OR;  Service: Open Heart Surgery;  Laterality: N/A;  [place] only  . 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 INTRAVASCULAR PRESSURE WIRE/FFR STUDY N/A 05/12/2019   Procedure: INTRAVASCULAR PRESSURE WIRE/FFR STUDY;  Surgeon: Lyn Records, MD;  [facility] INVASIVE CV LAB;  Service: Cardiovascular;  Laterality: N/A;  . LEFT HEART CATH AND CORONARY ANGIOGRAPHY N/A 05/12/2019   Procedure: LEFT HEART CATH AND CORONARY ANGIOGRAPHY;  Surgeon: Lyn Records, MD;  [facility] INVASIVE CV LAB;  Service: Cardiovascular;  Laterality: N/A;  . PACEMAKER GENERATOR CH[facility]  [facility]  . TEE WITHOUT CARDIOVERSION N/A 05/23/2019   Procedure: TRANSESOPHAGEAL ECHOCARDIOGRAM (TEE);  Surgeon: [name], MD;  Location: [facility] OR;  Service: Open Heart Surgery;  Laterality: N/A;  . TONSILLECTOMY       (Not in a hospital admission)   Inpatient Medications:   Allergies:  Allergies  Allergen Reactions  . Crestor [Rosuvastatin Calcium] Other (See Comments)    Back spasms, but can tolerate it at a low dose now  . Rosuvastatin Other (See Comments)    Back spasms, but can tolerate it at a low dose now    Social History   Socioeconomic History  . Marital status: Divorced    Spouse name: Not on file  . Number of children: Not on file  . Years of education: Not on file  . Highest education level: Not on file  Occupational History  . Not on file  Tobacco Use  . Smoking  status: Former Smoker    Packs/day: 3.00    Years: 35.00    Pack years: 105.00    Types: Cigarettes    Quit date: 05/22/2019    Years since quitting: 0.8  . Smokeless tobacco: Former Neurosurgeon    Quit date: 05/21/2017  Vaping Use  . Vaping Use: Never used  Substance and Sexual Activity  . Alcohol use: Not Currently  . Drug use: Not Currently    Types: Cocaine    Comment: none since 1995  . Sexual activity: Yes  Other Topics Concern  . Not on file  Social History Narrative  . Not on file   Social Determinants of Health   Financial Resource Strain: Not on file  Food Insecurity: Not on file  Transportation Needs: Not on file  Physical Activity: Not on file  Stress: Not on file  Social Connections: Not on file  Intimate Partner Violence: Not on file     Family History  Problem Relation Age of Onset  . Heart attack Mother   . Heart attack Father   . Heart attack Sister 39     Review of Systems: All other systems reviewed and are otherwise negative except as noted above.  Physical Exam: Vitals:   03/18/20 1030 03/18/20 1040 03/18/20 1050 03/18/20 1100  BP: 111/66 113/63 107/63 108/62  Pulse: 65 65 64 66  Resp: 10 13 14 13   Temp:      TempSrc:      SpO2: 98% 100% 99% 99%    GEN- The patient is well appearing, alert and oriented x 3 today.   HEENT: normocephalic, atraumatic; sclera clear, conjunctiva pink; hearing intact; oropharynx clear; neck supple Lungs- Clear to ausculation bilaterally, normal work of breathing.  No wheezes, rales, rhonchi Heart- Regular rate and rhythm, no murmurs, rubs or gallops GI- soft, non-tender, non-distended, bowel sounds present Extremities- no clubbing, cyanosis, or edema; DP/PT/radial pulses 2+ bilaterally MS- no significant deformity or atrophy Skin- warm and dry, no rash or lesion Psych- euthymic mood, full affect Neuro- strength and sensation are intact  Labs:   Lab Results  Component Value Date   WBC 8.0 02/04/2020   HGB  13.5 02/04/2020   HCT 42.7 02/04/2020   MCV 93.8 02/04/2020   PLT 167 02/04/2020   No results for input(s): NA, K, CL, CO2, BUN, CREATININE, CALCIUM, PROT, BILITOT, ALKPHOS, ALT, AST, GLUCOSE in the last 168 hours.  Invalid input(s): LABALBU    Radiology/Studies: DG Chest Portable 1 View  Result Date: 03/18/2020 CLINICAL DATA:  Defibrillator discharge. EXAM: PORTABLE CHEST 1 VIEW COMPARISON:  It is 02/04/2020 FINDINGS: Cardiac enlargement without heart failure or edema. AICD unchanged in position with 2 leads in the right ventricle. Left atrial appendage clip again noted. COPD with hyperinflation. Pleuroparenchymal scarring is present on the right with associated pleural calcification. Left lower lobe reticular markings consistent with scarring unchanged. Apical emphysema bilaterally. IMPRESSION: COPD with bibasilar scarring and apical emphysema. Calcified pleural plaques on the right. No superimposed acute abnormality. Electronically Signed   By: 02/06/2020 M.D.   On: 03/18/2020 10:37    EKG: on arrival shows VT in 140s with ATP (personally reviewed)  TELEMETRY: Pt had period of VT in 140s with successful ATP. Otherwise NSR 60-80s (personally reviewed)  DEVICE HISTORY:  MDT single chamber implanted/new RV lead and device3/07/2011 >> gen change 09/29/2019 Initial implant 2005(RV lead was 6949 lead, is abandoned)  + hx of appropriate therapies AAD amiodarone (on historically) and restarted March 2021  Assessment/Plan: 1.  Recurrent VT Per notes: Pt has a poor prognosis with large burden of LV scar and para-aortic substrate as well. Per [facility] EP planned to discuss potential referral to [facility] for re-attemp at ablation.  Despite heavy burden of AADs Agree with re-bolus amiodarone. He was on 200 mg BID since January, Prior to that he was on 400 mg BID (TID prior to that) Continue mexitil 150 mg BID He was previously on ranolazine but this was stopped by his EP at [facility].   We would not attempt ablation here.  No acute issues on labs, suspect primary VT event.   2. CAD s/p CABG 05/2019 Denies s/s ischemia HS trop WNL today.  He was previously taken off ranolazine for futility. Low utility to add back.   3. Chronic systolic CHF Echo 08/2019 with LVEF 30-35% Could consider updating but would be unlikely  to change plan.   Will discuss with Dr. Lalla Brothers. Suspect plan will be to load IV amiodarone and stress importance of very close follow up with [facility] for consideration of referral to a tertiary center for ablation consideration.  As this appears to have possibly been set in motion or considered with his primary team, with [facility] involvement would defer to them as to not further cloud the issue.   ADDENDUM: Dr. Lalla Brothers to see. Plan to admit at least overnight for observation and amio load.   For questions or updates, please contact [facility] Please consult [email or web address] for contact info under Cardiology/STEMI.  Dustin Flock, PA-C  03/18/2020 11:19 AM

## 2020-03-18 NOTE — ED Provider Notes (Signed)
[name] Arrival date & time: 03/18/20  1003     History Chief Complaint  Patient presents with  . Irregular Heart Beat    Max Rivas is a 70 y.o. male.  The history is provided by the patient and the EMS personnel.  Illness Location:  General Severity:  Mild Onset quality:  Sudden Timing:  Intermittent Progression:  Waxing and waning Chronicity:  New Context:  Patient with history of V. tach status post pacemaker on blood thinner who presents the ED after his defibrillator fired.  Patient was feeling lightheaded and weak and defibrillator fired.  Happened again with EMS as well.  EMS started amiodarone which patient is on chronically. Relieved by:  Nothing Worsened by:  Nothing Associated symptoms: no abdominal pain, no chest pain, no cough, no ear pain, no fever, no rash, no shortness of breath, no sore throat and no vomiting        Past Medical History:  Diagnosis Date  . Arthritis    "elbows, knees" (02/24/2016)  . Bronchitis 2006  . CAD (coronary artery disease)    a. s/p prior MIs - 1995 x 2, 1998; b. s/p prior LCX stenting; c. 04/2019 Cath: LM min irregs, LAD 65p/mi, D1 75, LCX 99ost/p, 81m (ISR), OM2 80, RCA/RPDA mod diff dzs; d. 05/2019 CABG x 3: LIMA->LAD, VG->Diag, VG->OM.  Marland Kitchen COPD (chronic obstructive pulmonary disease) (HCC)    a. Remote tobacco-->on home O2.  Marland Kitchen GERD (gastroesophageal reflux disease)   . 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 05/2019 Echo: EF 40-45%, gr2 DD. Nl RV size/fxn. Mildly dil LA. Mild MR.  . High cholesterol   . Ischemic cardiomyopathy    a. 05/2019 Echo: EF 40-45%.  . Morbid obesity (HCC)   . Myocardial infarction [facility] ~ 1995 X 2;1998; 2000; 2004  . On home oxygen therapy    "2L w/activity" (02/24/2016)  . PAF (paroxysmal atrial fibrillation) (HCC)    a. CHA2DS2VASc = 4-->eliquis. Also on amio.  . Pulmonary embolism (HCC)  02/24/2016  . Sleep apnea    "have CPAP; can't tolerate it" (02/24/2016)  . Ventricular tachycardia (HCC)    a. 2005 s/p ICD; b. 03/2011 Device upgrade/lead exchange: MDT Protecta XT VR single lead AICD.    Patient Active Problem List   Diagnosis Date Noted  . Ventricular tachyarrhythmia (HCC) 12/07/2019  . Cardiomyopathy, ischemic   . Vomiting   . Acute respiratory failure with hypoxia (HCC) 09/18/2019  . Acute on chronic heart failure (HCC) 09/08/2019  . Unstable angina (HCC) 05/21/2019  . Ventricular tachycardia (HCC) 05/12/2019  . Chest pain   . VT (ventricular tachycardia) (HCC) 05/11/2019  . Arrhythmia 03/12/2019  . On home O2   . Chronic HFrEF (heart failure with reduced ejection fraction) (HCC) 06/22/2018  . Chronic respiratory failure with hypoxia (HCC) 06/22/2018  . Congestive heart failure (CHF) (HCC) 06/22/2018  . DCM (dilated cardiomyopathy) (HCC) 05/25/2017  . Coronary artery disease 05/25/2017  . Chronic atrial fibrillation 05/25/2017  . COPD (chronic obstructive pulmonary disease) (HCC) 05/25/2017  . CKD (chronic kidney disease) 05/25/2017  . Hypothyroidism 05/25/2017  . 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 OSA (obstructive sleep apnea) 05/25/2017  . Pulmonary HTN (HCC) 05/25/2017  . Mediastinal adenopathy   . Cervical adenopathy   . Acute respiratory failure with hypoxia and hypercarbia (HCC) 02/24/2016  . Pulmonary embolus (HCC) 02/24/2016  . Pulmonary artery thrombosis (HCC)   .  Pleural effusion   . Lung mass   . Pleural plaque   . Flutter-fibrillation 01/15/2016  . Acute systolic congestive heart failure (HCC)   . Atrial fibrillation with RVR (HCC)   . Hypercholesterolemia 03/11/2006  . Tobacco abuse 03/11/2006  . Acute on chronic systolic congestive heart failure (HCC) 03/11/2006  . GASTROESOPHAGEAL REFLUX, NO ESOPHAGITIS 03/11/2006  . OSTEOARTHRITIS, MULTI SITES 03/11/2006  . HIGH RISK PATIENT 03/11/2006  . Tobacco  dependence 03/11/2006    Past Surgical History:  Procedure Laterality Date  . CARDIOVERSION N/A 09/11/2019   Procedure: CARDIOVERSION;  Surgeon: Jodelle Red, MD;  Location: [facility] ENDOSCOPY;  Service: Cardiovascular;  Laterality: N/A;  . CLIPPING OF ATRIAL APPENDAGE N/A 05/23/2019   Procedure: CLIPPING OF ATRIAL APPENDAGE with atriclip;  Surgeon: [name], MD;  Location: [facility] OR;  Service: Open Heart Surgery;  Laterality: N/A;  . CORONARY ANGIOPLASTY  1995  . CORONARY ANGIOPLASTY WITH STENT PLACEMENT  ~ 1995 - 2004 X 5   "I've got a total of 5 stents" (02/24/2016)  . CORONARY ARTERY BYPASS GRAFT N/A 05/23/2019   Procedure: CORONARY ARTERY BYPASS GRAFTING (CABG), times three, using right greater saphenous vein and left internal mammary;  Surgeon: [name], MD;  Location: [facility] OR;  Service: Open Heart Surgery;  Laterality: N/A;  [place] only  . 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 INTRAVASCULAR PRESSURE WIRE/FFR STUDY N/A 05/12/2019   Procedure: INTRAVASCULAR PRESSURE WIRE/FFR STUDY;  Surgeon: Lyn Records, MD;  Location: [facility] INVASIVE CV LAB;  Service: Cardiovascular;  Laterality: N/A;  . LEFT HEART CATH AND CORONARY ANGIOGRAPHY N/A 05/12/2019   Procedure: LEFT HEART CATH AND CORONARY ANGIOGRAPHY;  Surgeon: Lyn Records, MD;  Location: [facility] INVASIVE CV LAB;  Service: Cardiovascular;  Laterality: N/A;  . PACEMAKER GENERATOR CHANGE  03/2011    [facility]  . TEE WITHOUT CARDIOVERSION N/A 05/23/2019   Procedure: TRANSESOPHAGEAL ECHOCARDIOGRAM (TEE);  Surgeon: [name], MD;  Location: [facility] OR;  Service: Open Heart Surgery;  Laterality: N/A;  . TONSILLECTOMY         Family History  Problem Relation Age of Onset  . Heart attack Mother   . Heart attack Father   . Heart attack Sister 69    Social History   Tobacco Use  . Smoking status: Former Smoker    Packs/day: 3.00    Years: 35.00    Pack years: 105.00     Types: Cigarettes    Quit date: 05/22/2019    Years since quitting: 0.8  . Smokeless tobacco: Former Neurosurgeon    Quit date: 05/21/2017  Vaping Use  . Vaping Use: Never used  Substance Use Topics  . Alcohol use: Not Currently  . Drug use: Not Currently    Types: Cocaine    Comment: none since 1995    Home Medications Prior to Admission medications   Medication Sig Start Date End Date Taking? Authorizing Provider  acetaminophen (TYLENOL) 500 MG tablet Take 500-1,000 mg by mouth every 6 (six) hours as needed for moderate pain, headache or mild pain.   Yes [provider]  amiodarone (PACERONE) 200 MG tablet Take 2 tablets (400 mg total) by mouth daily. Patient taking differently: Take 200 mg by mouth 2 (two) times daily. 01/14/20  Yes Dolan Amen, MD  apixaban (ELIQUIS) 5 MG TABS tablet Take 5 mg by mouth 2 (two) times daily.   Yes [provider]  aspirin 81 MG chewable tablet Chew 1 tablet (81 mg total) by mouth daily. 05/16/19  Yes Kroeger, Ovidio Kin., PA-C  Cholecalciferol (VITAMIN D3) 50 MCG (2000 UT) TABS Take 2,000 Units by mouth daily.   Yes [provider]  furosemide (LASIX) 40 MG tablet Take 40 mg by mouth 2 (two) times daily.    Yes [provider]  gabapentin (NEURONTIN) 100 MG capsule Take 2 capsules (200 mg total) by mouth 2 (two) times daily. 06/01/19  Yes Barrett, Erin R, PA-C  levalbuterol (XOPENEX HFA) 45 MCG/ACT inhaler Inhale 2 puffs into the lungs every 6 (six) hours as needed for wheezing or shortness of breath.   Yes [provider]  levothyroxine (SYNTHROID) 75 MCG tablet Take 75 mcg by mouth daily before breakfast.   Yes [provider]  metoprolol succinate (TOPROL-XL) 25 MG 24 hr tablet Take 12.5 mg by mouth in the morning.   Yes [provider]  mexiletine (MEXITIL) 150 MG capsule Take 2 capsules (300 mg total) by mouth 2 (two) times daily. Patient taking differently: Take 150 mg by mouth 2 (two) times  daily. 12/07/19  Yes Georgie Chard D, NP  mometasone [facility]) 220 MCG/INH inhaler Inhale 2 puffs into the lungs 2 (two) times daily.   Yes [provider]  montelukast (SINGULAIR) 10 MG tablet Take 10 mg by mouth at bedtime.   Yes [provider]  Multiple Vitamin (MULTIVITAMIN WITH MINERALS) TABS tablet Take 1 tablet by mouth daily with breakfast.   Yes [provider]  nitroGLYCERIN (NITROSTAT) 0.4 MG SL tablet Place 0.4 mg under the tongue every 5 (five) minutes as needed for chest pain.    Yes [provider]  Omega-3 Fatty Acids (FISH OIL) 1000 MG CAPS Take 1,000 mg by mouth in the morning and at bedtime.   Yes [provider]  omeprazole (PRILOSEC) 20 MG capsule Take 20 mg by mouth in the morning and at bedtime.   Yes [provider]  Propylene Glycol (SYSTANE BALANCE) 0.6 % SOLN Place 1 drop into both eyes See admin instructions. Place 1 drop into each eye four times a day and apply a warm compress for 10 minutes afterwards    Yes [provider]  rosuvastatin (CRESTOR) 40 MG tablet Take 20 mg by mouth at bedtime.   Yes [provider]  Tiotropium Bromide-Olodaterol 2.5-2.5 MCG/ACT AERS Inhale 1 puff into the lungs in the morning and at bedtime.   Yes [provider]  acetaminophen (TYLENOL) 325 MG tablet Take 2 tablets (650 mg total) by mouth every 6 (six) hours as needed for mild pain (or Fever >/= 101). Patient not taking: No sig reported 03/05/16   Alison Murray, MD  atorvastatin (LIPITOR) 80 MG tablet Take 1 tablet (80 mg total) by mouth daily. Patient not taking: No sig reported 05/15/19   Beatriz Stallion., PA-C  omega-3 acid ethyl esters (LOVAZA) 1 g capsule Take 1 capsule (1 g total) by mouth 2 (two) times daily. Patient not taking: Reported on 03/18/2020 03/05/16   Alison Murray, MD    Allergies    Crestor [rosuvastatin calcium], Rosuvastatin, Albuterol, and Atorvastatin  Review of Systems   Review  of Systems  Constitutional: Negative for chills and fever.  HENT: Negative for ear pain and sore throat.   Eyes: Negative for pain and visual disturbance.  Respiratory: Negative for cough and shortness of breath.   Cardiovascular: Negative for chest pain and palpitations.  Gastrointestinal: Negative for abdominal pain and vomiting.  Genitourinary: Negative for dysuria and hematuria.  Musculoskeletal: Negative for arthralgias and back pain.  Skin: Negative for color change and rash.  Neurological: Positive for light-headedness. Negative for seizures and syncope.  All other systems reviewed and are negative.   Physical Exam Updated Vital Signs  ED Triage Vitals [03/18/20 1011]  Enc Vitals Group     BP 118/76     Pulse Rate 89     Resp 13     Temp 97.7 F (36.5 C)     Temp Source Oral     SpO2 90 %     Weight      Height      Head Circumference      Peak Flow      Pain Score      Pain Loc      Pain Edu?      Excl. in GC?     Physical Exam Vitals and nursing note reviewed.  Constitutional:      General: He is not in acute distress.    Appearance: He is well-developed and well-nourished. He is not ill-appearing.  HENT:     Head: Normocephalic and atraumatic.     Nose: Nose normal.     Mouth/Throat:     Mouth: Mucous membranes are moist.  Eyes:     Conjunctiva/sclera: Conjunctivae normal.     Pupils: Pupils are equal, round, and reactive to light.  Cardiovascular:     Rate and Rhythm: Normal rate and regular rhythm.     Pulses: Normal pulses.     Heart sounds: Normal heart sounds. No murmur heard.   Pulmonary:     Effort: Pulmonary effort is normal. No respiratory distress.     Breath sounds: Normal breath sounds.  Abdominal:     Palpations: Abdomen is soft.     Tenderness: There is no abdominal tenderness.  Musculoskeletal:        General: No edema.     Cervical back: Normal range of motion and neck supple.  Skin:    General: Skin is warm and dry.      Capillary Refill: Capillary refill takes less than 2 seconds.  Neurological:     General: No focal deficit present.     Mental Status: He is alert.  Psychiatric:        Mood and Affect: Mood and affect normal.     ED Results / Procedures / Treatments   Labs (all labs ordered are listed, but only abnormal results are displayed) Labs Reviewed  COMPREHENSIVE METABOLIC PANEL - Abnormal; Notable for the following components:      Result Value   Glucose, Bld 126 (*)    Creatinine, Ser 1.55 (*)    GFR, Estimated 49 (*)    All other components within normal limits  TROPONIN I (HIGH SENSITIVITY) - Abnormal; Notable for the following components:   Troponin I (High Sensitivity) 21 (*)    All other components within normal limits  RESP PANEL BY RT-PCR (FLU A&B, COVID) ARPGX2  CBC WITH DIFFERENTIAL/PLATELET  MAGNESIUM  TSH  TROPONIN I (HIGH SENSITIVITY)    EKG EKG Interpretation  Date/Time:  Monday March 18 2020 10:10:15 EST Ventricular Rate:  98 PR Interval:    QRS Duration: 135 QT Interval:  407 QTC Calculation: 520 R Axis:   60 Text Interpretation: Atrial fibrillation IVCD, consider atypical LBBB Confirmed by Virgina Norfolk [phone]) on 03/18/2020 11:15:59 AM   Radiology DG Chest  Portable 1 View  Result Date: 03/18/2020 CLINICAL DATA:  Defibrillator discharge. EXAM: PORTABLE CHEST 1 VIEW COMPARISON:  It is 02/04/2020 FINDINGS: Cardiac enlargement without heart failure or edema. AICD unchanged in position with 2 leads in the right ventricle. Left atrial appendage clip again noted. COPD with hyperinflation. Pleuroparenchymal scarring is present on the right with associated pleural calcification. Left lower lobe reticular markings consistent with scarring unchanged. Apical emphysema bilaterally. IMPRESSION: COPD with bibasilar scarring and apical emphysema. Calcified pleural plaques on the right. No superimposed acute abnormality. Electronically Signed   By: Marlan Palau M.D.   On:  03/18/2020 10:37    Procedures .Critical Care Performed by: Virgina Norfolk, DO Authorized by: Virgina Norfolk, DO   Critical care provider statement:    Critical care time (minutes):  35   Critical care was necessary to treat or prevent imminent or life-threatening deterioration of the following conditions:  Cardiac failure (vtach)   Critical care was time spent personally by me on the following activities:  Blood draw for specimens, development of treatment plan with patient or surrogate, discussions with primary provider, evaluation of patient's response to treatment, examination of patient, obtaining history from patient or surrogate, ordering and performing treatments and interventions, ordering and review of laboratory studies, ordering and review of radiographic studies, pulse oximetry, re-evaluation of patient's condition and review of old charts   I assumed direction of critical care for this patient from another provider in my specialty: no       Medications Ordered in ED Medications  amiodarone (NEXTERONE PREMIX) 360-4.14 MG/200ML-% (1.8 mg/mL) IV infusion (60 mg/hr Intravenous New Bag/Given 03/18/20 1024)  amiodarone (NEXTERONE PREMIX) 360-4.14 MG/200ML-% (1.8 mg/mL) IV infusion (has no administration in time range)    ED Course  I have reviewed the triage vital signs and the nursing notes.  Pertinent labs & imaging results that were available during my care of the patient were reviewed by me and considered in my medical decision making (see chart for details).    MDM Rules/Calculators/A&P                          CODI KRAHENBUHL is a 70 year old male with history of CAD, ventricular tachycardia status post ICD, COPD, PE on Eliquis who presents the ED after defibrillation fired in the setting of V. tach.  Patient with overall unremarkable vitals here.  EKG shows atrial fibrillation at normal rate.  Patient got lightheaded and dizzy and weak and defibrillator fired.  EMS got  there and in route defibrillator fired again which appeared to be appropriate.  Patient got symptomatic appeared to have wide tachycardic rhythm.  Patient was started on amiodarone by EMS.  We will continue amiodarone upon arrival here.  Patient normotensive with normal vitals.  No chest pain and normal mentation.  It appears that he is having appropriate firing of his defibrillator.  Upon chart review it appears this happened around last year as well.  He had a heart cath at that time and overall V. tach was thought to be ischemic related.  He had some multivessel disease that was not amenable to PCI but it appears that patient had declined CABG.  Cardiology has been consulted.  Patient is on IV amiodarone infusion.  We will get basic labs and await cardiology recommendations.  Anticipate admission to their service.  Lab work showed no significant anemia, electrolyte abnormality, kidney injury.  Troponin normal.  Cardiology will admit for further care.  Hemodynamically stable throughout my care while on IV amiodarone.  No other episodes of V. tach or defibrillations.  Suspect V. tach is from chronic ischemic disease.  This chart was dictated using voice recognition software.  Despite best efforts to proofread,  errors can occur which can change the documentation meaning.    Final Clinical Impression(s) / ED Diagnoses Final diagnoses:  V-tach [facility](HCC)    Rx / DC Orders ED Discharge Orders    None       Virgina NorfolkCuratolo, Jalee Saine, DO 03/18/20 1503

## 2020-03-18 NOTE — ED Notes (Signed)
Dinner Tray Ordered @ 1700. 

## 2020-03-19 ENCOUNTER — Observation Stay (HOSPITAL_BASED_OUTPATIENT_CLINIC_OR_DEPARTMENT_OTHER): Payer: Medicare Other

## 2020-03-19 DIAGNOSIS — I472 Ventricular tachycardia: Secondary | ICD-10-CM | POA: Diagnosis not present

## 2020-03-19 LAB — ECHOCARDIOGRAM COMPLETE
Area-P 1/2: 2.14 cm2
Height: 71 in
MV M vel: 4.66 m/s
MV Peak grad: 86.9 mmHg
Radius: 0.4 cm
S' Lateral: 4.6 cm
Single Plane A2C EF: 34.9 %
Weight: 3248 oz

## 2020-03-19 LAB — BASIC METABOLIC PANEL
Anion gap: 9 (ref 5–15)
BUN: 12 mg/dL (ref 8–23)
CO2: 28 mmol/L (ref 22–32)
Calcium: 8.8 mg/dL — ABNORMAL LOW (ref 8.9–10.3)
Chloride: 101 mmol/L (ref 98–111)
Creatinine, Ser: 1.23 mg/dL (ref 0.61–1.24)
GFR, Estimated: >60 mL/min (ref >60)
Glucose, Bld: 86 mg/dL (ref 70–99)
Potassium: 3.3 mmol/L — ABNORMAL LOW (ref 3.5–5.1)
Sodium: 138 mmol/L (ref 135–145)

## 2020-03-19 MED ORDER — POTASSIUM CHLORIDE CRYS ER 20 MEQ PO TBCR
40.0000 meq | EXTENDED_RELEASE_TABLET | Freq: Once | ORAL | Status: AC
  Administered 2020-03-19: 40 meq via ORAL
  Filled 2020-03-19: qty 2

## 2020-03-19 MED ORDER — AMIODARONE HCL 200 MG PO TABS
400.0000 mg | ORAL_TABLET | Freq: Two times a day (BID) | ORAL | Status: DC
  Administered 2020-03-19: 400 mg via ORAL
  Filled 2020-03-19: qty 2

## 2020-03-19 MED ORDER — POTASSIUM CHLORIDE CRYS ER 20 MEQ PO TBCR: 20.0000 meq | EXTENDED_RELEASE_TABLET | Freq: Two times a day (BID) | ORAL | 6 refills | Status: DC

## 2020-03-19 MED ORDER — AMIODARONE HCL 400 MG PO TABS: 400.0000 mg | ORAL_TABLET | Freq: Two times a day (BID) | ORAL | 6 refills | Status: DC

## 2020-03-19 NOTE — Progress Notes (Signed)
Patient without complaint this am.   K 3.3 this am. Will supp aggressively. VT quiescent.   Plan transition to po amiodarone and home around lunch time if no further VT.   He understands need for very close follow up with his EP at the [place] [facility] [place]. (As soon as this Thursday if possible).  He has been instructed to call.   Full note pending disposition.   Casimiro Needle [address] [place], PA-C  03/19/2020 7:45 AM

## 2020-03-19 NOTE — Discharge Instructions (Signed)
Sinus Tachycardia  Sinus tachycardia is a kind of fast heartbeat. In sinus tachycardia, the heart beats more than 100 times a minute. Sinus tachycardia starts in a part of the heart called the sinus node. Sinus tachycardia may be harmless, or it may be a sign of a serious condition. What are the causes? This condition may be caused by:  Exercise or exertion.  A fever.  Pain.  Loss of body fluids (dehydration).  Severe bleeding (hemorrhage).  Anxiety and stress.  Certain substances, including: ? Alcohol. ? Caffeine. ? Tobacco and nicotine products. ? Cold medicines. ? Illegal drugs.  Medical conditions including: ? Heart disease. ? An infection. ? An overactive thyroid (hyperthyroidism). ? A lack of red blood cells (anemia). What are the signs or symptoms? Symptoms of this condition include:  A feeling that the heart is beating quickly (palpitations).  Suddenly noticing your heartbeat (cardiac awareness).  Dizziness.  Tiredness (fatigue).  Shortness of breath.  Chest pain.  Nausea.  Fainting. How is this diagnosed? This condition is diagnosed with:  A physical exam.  Other tests, such as: ? Blood tests. ? An electrocardiogram (ECG). This test measures the electrical activity of the heart. ? Ambulatory cardiac monitor. This records your heartbeats for 24 hours or more. You may be referred to a heart specialist (cardiologist). How is this treated? Treatment for this condition depends on the cause or the underlying condition. Treatment may involve:  Treating the underlying condition.  Taking new medicines or changing your current medicines as told by your health care provider.  Making changes to your diet or lifestyle. Follow these instructions at home: Lifestyle  Do not use any products that contain nicotine or tobacco, such as cigarettes and e-cigarettes. If you need help quitting, ask your health care provider.  Do not use illegal drugs, such as  cocaine.  Learn relaxation methods to help you when you get stressed or anxious. These include deep breathing.  Avoid caffeine or other stimulants.   Alcohol use  Do not drink alcohol if: ? Your health care provider tells you not to drink. ? You are pregnant, may be pregnant, or are planning to become pregnant.  If you drink alcohol, limit how much you have: ? 0-1 drink a day for women. ? 0-2 drinks a day for men.  Be aware of how much alcohol is in your drink. In the [place] one drink equals one typical bottle of beer (12 oz), one-half glass of wine (5 oz), or one shot of hard liquor (1 oz).   General instructions  Drink enough fluids to keep your urine pale yellow.  Take over-the-counter and prescription medicines only as told by your health care provider.  Keep all follow-up visits as told by your health care provider. This is important. Contact a health care provider if you have:  A fever.  Vomiting or diarrhea that does not go away. Get help right away if you:  Have pain in your chest, upper arms, jaw, or neck.  Become weak or dizzy.  Feel faint.  Have palpitations that do not go away. Summary  In sinus tachycardia, the heart beats more than 100 times a minute.  Sinus tachycardia may be harmless, or it may be a sign of a serious condition.  Treatment for this condition depends on the cause or the underlying condition.  Get help right away if you have pain in your chest, upper arms, jaw, or neck. This information is not intended to replace advice given to   you by your health care provider. Make sure you discuss any questions you have with your health care provider. Document Revised: 02/17/2017 Document Reviewed: 02/17/2017 Elsevier Patient Education  2021 Elsevier Inc.   Ventricular Tachycardia  Ventricular tachycardia is a type of arrhythmia that begins in the lower chambers of the heart (ventricles). An arrhythmia is a type of abnormal heart rhythm. A normal  heartbeat usually starts when an area in the heart called the sinoatrial (SA) node releases an electrical signal. With ventricular tachycardia, electrical signals in the ventricles fire abnormally and interfere with the electrical signals released by the SA node. A normal resting heart rate is 60-100 beats per minute. During an episode of ventricular tachycardia, the heart reaches 100 beats per minute or higher. This condition can be life-threatening and should be treated right away. What are the causes? This condition is caused by abnormal electrical activity in your ventricles. This may result from:  Your heart not getting enough oxygen. Blood flow problems in your arteries may cause this.  Cardiomyopathy, or diseases of your heart muscle.  Medicines.  Sarcoidosis, or a disease that causes inflammation and affects multiple areas of your body.  Drug use, such as use of cocaine, methamphetamines, or anabolic steroids. What increases the risk? The following factors may make you more likely to develop this condition:  A previous heart attack.  Diseases or disorders of your heart, such as: ? Structural abnormalities of the heart. These may be present at birth (congenital) or may develop over time. ? Narrowed or blocked arteries that lead to the heart (coronary artery disease). ? Abnormal heart tissue. ? Leaking or narrow valves in your heart. ? A family history of stopped heartbeat (cardiac arrest). ? A family history of coronary artery disease.  Certain health conditions that can cause heart problems, such as: ? Diabetes. ? An infection that affects your heart. ? High blood pressure (hypertension). ? An overactive or underactive thyroid. ? Sleep apnea. This is paused breathing or shallow breathing during sleep. ? High cholesterol.  Using products that contain tobacco.  Heavy alcohol use. What are the signs or symptoms? Symptoms of this condition include:  Fast or irregular  heartbeats (palpitations).  Shortness of breath.  Anxiety.  Dizziness or light-headedness.  Fainting.  Chest pain.  Cardiac arrest caused by an arrhythmia. How is this diagnosed? This condition may be diagnosed based on:  Electrocardiogram (ECG), which checks for problems with electrical activity in your heart.  A physical exam.  Your symptoms and medical history.  Holter monitor or event monitor test, which involves wearing a portable device that monitors your heart rate over time. You may also have other tests, including:  Blood tests.  Chest X-ray.  Echocardiogram. This test uses sound waves to create images of your heart.  Angiogram. During this test, dye is injected into your bloodstream, and then X-rays are taken. The dye helps to show blood flow in your arteries.  Exercise stress test. During this test, you will have an ECG while you exercise on a treadmill.  Cardiac CT scan or cardiac MRI. How is this treated? This condition is a medical emergency that must be treated right away. If the condition is not treated right away, it becomes life-threatening. Treatment for this condition depends on the cause. Treatment may include:  An electric shock, or cardioversion, to return your heartbeat to a normal rhythm.  Medicines that slow your heart rate and return it to a normal rhythm (anti-arrhythmics).  An electrophysiology  study. This can help locate areas of heart tissue that are causing fast heartbeats. ? In this procedure, a long, thin tube (catheter) is inserted into a vein and moved to your heart to evaluate your heart's electrical activity. ? In some cases, the heart tissue that is causing problems may be burned out with a low-level energy source delivered through the catheter. This may help your heart keep a normal rhythm.  An implantable cardioverter-defibrillator (ICD). This device is inserted under the skin in your chest to monitor your heartbeat. When the  ICD senses an irregular heartbeat, it sends a shock to return the heartbeat to normal.  A wearable cardioverter-defibrillator (WCD) to use while treatment options are being evaluated.  Genetic counseling to check whether your family members are at risk for ventricular tachycardia.  Surgery to improve blood flow to the heart. Follow these instructions at home: Eating and drinking  Eat a healthy diet. This includes plenty of fruits and vegetables, whole grains, lean meats, and low-fat or fat-free dairy products.  Avoid eating foods that are high in saturated fat, trans fat, sugar, or salt.   Lifestyle  Do not drink alcohol if: ? Your health care provider tells you not to drink. ? You are pregnant, may be pregnant, or are planning to become pregnant.  If you drink alcohol: ? Limit how much you use to:  0-1 drink a day for women.  0-2 drinks a day for men. ? Be aware of how much alcohol is in your drink. In the [place] one drink equals one 12 oz bottle of beer (355 mL), one 5 oz glass of wine (148 mL), or one 1 oz glass of hard liquor (44 mL).  Do not use any products that contain nicotine or tobacco, such as cigarettes, e-cigarettes, and chewing tobacco. If you need help quitting, ask your health care provider.  Do not use drugs that excite, or stimulate, your system, such as cocaine or methamphetamines.  Maintain a healthy weight.  Manage stress through relaxation exercises, yoga, quiet time, or meditation.  Try to get at least 7 hours of sleep each night.   General instructions  Take over-the-counter and prescription medicines only as told by your health care provider.  Exercise regularly. Ask what activities are safe for you. Aim for one or a combination of the following each week: ? 150 minutes of moderate-intensity exercise. ? 75 minutes of exercise that takes a lot of effort.  Keep all follow-up visits as told by your health care provider. This is important. Where to  find more information  American Heart Association: www.heart.org Contact a health care provider if:  Your symptoms get worse.  You develop new symptoms, such as new palpitations or shortness of breath.  You feel depressed. Get help right away if you have:  An episode of ventricular tachycardia that lasts more than a few seconds.  Chest pain.  Trouble breathing.  Light-headedness or fainting. These symptoms may represent a serious problem that is an emergency. Do not wait to see if the symptoms will go away. Get medical help right away. Call your local emergency services (911 in the [place] Do not drive yourself to the hospital. Summary  Ventricular tachycardia is a fast heartbeat that begins in the lower chambers of the heart (ventricles). This condition can be life-threatening and should be treated right away.  Treatment may include electric shock, medicines, low-level energy therapy, a cardioverter-defibrillator, or surgery.  Get help right away if you have ventricular tachycardia  symptoms lasting longer than a few seconds, chest pain, or trouble breathing. This information is not intended to replace advice given to you by your health care provider. Make sure you discuss any questions you have with your health care provider. Document Revised: 11/02/2018 Document Reviewed: 11/02/2018 Elsevier Patient Education  2021 [employer].

## 2020-03-19 NOTE — Progress Notes (Signed)
  Echocardiogram 2D Echocardiogram has been performed.  Max Rivas 03/19/2020, 10:28 AM

## 2020-03-19 NOTE — Discharge Summary (Signed)
ELECTROPHYSIOLOGY PROCEDURE DISCHARGE SUMMARY    Patient ID: Max Rivas,  MRN: [id number], DOB/AGE: 1950-10-24 70 y.o.  Admit date: 03/18/2020 Discharge date: 03/19/2020  Primary Care Physician: Anson Fret, MD  Primary Cardiologist: Dr. Jillyn Hidden [facility] Kathryne Sharper) Electrophysiologist: Dr. Ladona Ridgel >>> Dr. Deanne Coffer [facility])  Primary Discharge Diagnosis:  Recurrent Ventricular Tachycardia  Secondary Discharge Diagnosis:  Chronic systolic CHF Hypokalemia  Brief HPI: Max Rivas is a 70 y.o. 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 ICM with EF 40-45%. History of PAF on eliquis with recent [facility] 8/31 for afib. History of single lead AICD and recurrent VT RX with oral amiodarone and mexiletinewho was admitted after being shocked by his ICD.   Hospital Course:  The patient presented to [facility] after ICD shock while taking his medications the am of 03/18/20. Labs on arrival WNL. He denies medication non-compliance or any recent illness.  He was loaded on IV amiodarone and transitioned back to po, to continue with increased dose pending follow up with his EP, Dr. Okey Regal.    Pt has been followed for recurrent VT for some time, including VT ablation 09/2019. Recent notes discuss possibility of referral to tertiary center for re-try VT ablation. Ranolazine previously stopped due to futility.  In discussion with patient plan is to follow up closely (possibly as soon as this Thursday) with his EP/[facility] cardiologist.    K 3.3 on 3/8 in setting of his oral diuretics.  He will be sent home with addition of 40 meq of K daily, with close follow up with his primary cards. He see [place] as needed, but due to cost prefers care to go through [place].   ADDENDUM Patient has already spoken with his primary EP RN and he is awaiting call back for an appointment.   Physical Exam: Vitals:   03/18/20 2000 03/18/20 2114  03/19/20 0429 03/19/20 0807  BP: (!) 99/56  (!) 99/57 (!) 100/54  Pulse: (!) 59 61 (!) 57 (!) 54  Resp: 16  16 16   Temp: 97.7 F (36.5 C)  (!) 97.4 F (36.3 C) 97.7 F (36.5 C)  TempSrc: Oral  Oral Oral  SpO2: 97%  95% 98%  Weight:      Height:        GEN- The patient is well appearing, alert and oriented x 3 today.   HEENT: normocephalic, atraumatic; sclera clear, conjunctiva pink; hearing intact; oropharynx clear; neck supple  Lungs- Clear to ausculation bilaterally, normal work of breathing.  No wheezes, rales, rhonchi Heart- Regular rate and rhythm, no murmurs, rubs or gallops  GI- soft, non-tender, non-distended, bowel sounds present  Extremities- no clubbing, cyanosis, or edema; DP/PT/radial pulses 2+ bilaterally, groin without hematoma/bruit MS- no significant deformity or atrophy Skin- warm and dry, no rash or lesion Psych- euthymic mood, full affect Neuro- strength and sensation are intact   Labs:   Lab Results  Component Value Date   WBC 8.6 03/18/2020   HGB 14.7 03/18/2020   HCT 44.4 03/18/2020   MCV 91.5 03/18/2020   PLT 162 03/18/2020    Recent Labs  Lab 03/18/20 1036 03/19/20 0308  NA 140 138  K 4.3 3.3*  CL 99 101  CO2 28 28  BUN 16 12  CREATININE 1.55* 1.23  CALCIUM 9.6 8.8*  PROT 7.3  --   BILITOT 0.7  --   ALKPHOS 71  --  ALT 30  --   AST 33  --   GLUCOSE 126* 86     Discharge Medications:  Allergies as of 03/19/2020      Reactions   Crestor [rosuvastatin Calcium] Other (See Comments)   Back spasms, initially, but can tolerate it at a low dose now, in 2022   Rosuvastatin Other (See Comments)   Back spasms, initially, but can tolerate it at a low dose now, in 2022   Albuterol Other (See Comments)   IRREGULAR HEART RATE   Atorvastatin Other (See Comments)   Spasticity      Medication List    TAKE these medications   acetaminophen 500 MG tablet Commonly known as: TYLENOL Take 500-1,000 mg by mouth every 6 (six) hours as needed  for moderate pain, headache or mild pain.   acetaminophen 325 MG tablet Commonly known as: TYLENOL Take 2 tablets (650 mg total) by mouth every 6 (six) hours as needed for mild pain (or Fever >/= 101).   amiodarone 400 MG tablet Commonly known as: PACERONE Take 1 tablet (400 mg total) by mouth 2 (two) times daily. What changed:   medication strength  when to take this   apixaban 5 MG Tabs tablet Commonly known as: ELIQUIS Take 5 mg by mouth 2 (two) times daily.   aspirin 81 MG chewable tablet Chew 1 tablet (81 mg total) by mouth daily.   atorvastatin 80 MG tablet Commonly known as: LIPITOR Take 1 tablet (80 mg total) by mouth daily.   Fish Oil 1000 MG Caps Take 1,000 mg by mouth in the morning and at bedtime.   furosemide 40 MG tablet Commonly known as: LASIX Take 40 mg by mouth 2 (two) times daily.   gabapentin 100 MG capsule Commonly known as: NEURONTIN Take 2 capsules (200 mg total) by mouth 2 (two) times daily.   levalbuterol 45 MCG/ACT inhaler Commonly known as: XOPENEX HFA Inhale 2 puffs into the lungs every 6 (six) hours as needed for wheezing or shortness of breath.   levothyroxine 75 MCG tablet Commonly known as: SYNTHROID Take 75 mcg by mouth daily before breakfast.   metoprolol succinate 25 MG 24 hr tablet Commonly known as: TOPROL-XL Take 12.5 mg by mouth in the morning.   mexiletine 150 MG capsule Commonly known as: MEXITIL Take 2 capsules (300 mg total) by mouth 2 (two) times daily. What changed: how much to take   mometasone 220 MCG/INH inhaler Commonly known as: ASMANEX Inhale 2 puffs into the lungs 2 (two) times daily.   montelukast 10 MG tablet Commonly known as: SINGULAIR Take 10 mg by mouth at bedtime.   multivitamin with minerals Tabs tablet Take 1 tablet by mouth daily with breakfast.   nitroGLYCERIN 0.4 MG SL tablet Commonly known as: NITROSTAT Place 0.4 mg under the tongue every 5 (five) minutes as needed for chest pain.    omega-3 acid ethyl esters 1 g capsule Commonly known as: LOVAZA Take 1 capsule (1 g total) by mouth 2 (two) times daily.   omeprazole 20 MG capsule Commonly known as: PRILOSEC Take 20 mg by mouth in the morning and at bedtime.   potassium chloride SA 20 MEQ tablet Commonly known as: KLOR-CON Take 1 tablet (20 mEq total) by mouth 2 (two) times daily.   rosuvastatin 40 MG tablet Commonly known as: CRESTOR Take 20 mg by mouth at bedtime.   Systane Balance 0.6 % Soln Generic drug: Propylene Glycol Place 1 drop into both eyes See admin instructions.  Place 1 drop into each eye four times a day and apply a warm compress for 10 minutes afterwards   Tiotropium Bromide-Olodaterol 2.5-2.5 MCG/ACT Aers Inhale 1 puff into the lungs in the morning and at bedtime.   Vitamin D3 50 MCG (2000 UT) Tabs Take 2,000 Units by mouth daily.       Disposition:    Follow-up Information    Marinus Maw, MD Follow up.   Specialty: Cardiology Why: as needed if you need to see a cardiologist here in town.  Contact information: [address] [place] [address] [phone]        Palamaner Danton Clap Geraldine Contras, MD Follow up.   Why: As Soon As Possible for post hospital follow up for VT.  Contact information: [address] [place] [address] [phone]               Duration of Discharge Encounter: Greater than 30 minutes including physician time.  Dustin Flock, PA-C  03/19/2020 12:23 PM

## 2020-03-21 DIAGNOSIS — I10 Essential (primary) hypertension: Secondary | ICD-10-CM | POA: Diagnosis not present

## 2020-03-21 DIAGNOSIS — I499 Cardiac arrhythmia, unspecified: Secondary | ICD-10-CM | POA: Diagnosis not present

## 2020-03-21 DIAGNOSIS — R0789 Other chest pain: Secondary | ICD-10-CM | POA: Diagnosis not present

## 2020-03-21 DIAGNOSIS — R079 Chest pain, unspecified: Secondary | ICD-10-CM | POA: Diagnosis not present

## 2020-03-27 DIAGNOSIS — R0789 Other chest pain: Secondary | ICD-10-CM | POA: Diagnosis not present

## 2020-03-27 DIAGNOSIS — R079 Chest pain, unspecified: Secondary | ICD-10-CM | POA: Diagnosis not present

## 2020-03-27 DIAGNOSIS — I4891 Unspecified atrial fibrillation: Secondary | ICD-10-CM | POA: Diagnosis not present

## 2020-04-08 DIAGNOSIS — J449 Chronic obstructive pulmonary disease, unspecified: Secondary | ICD-10-CM | POA: Diagnosis not present

## 2020-05-03 ENCOUNTER — Encounter (HOSPITAL_COMMUNITY): Payer: Self-pay

## 2020-05-03 ENCOUNTER — Emergency Department (HOSPITAL_COMMUNITY): Payer: No Typology Code available for payment source

## 2020-05-03 ENCOUNTER — Emergency Department (HOSPITAL_COMMUNITY)
Admission: EM | Admit: 2020-05-03 | Discharge: 2020-05-04 | Disposition: A | Discharge status: Home / Self Care | Payer: No Typology Code available for payment source | Attending: Emergency Medicine | Admitting: Emergency Medicine

## 2020-05-03 ENCOUNTER — Other Ambulatory Visit: Payer: Self-pay

## 2020-05-03 DIAGNOSIS — J449 Chronic obstructive pulmonary disease, unspecified: Secondary | ICD-10-CM | POA: Insufficient documentation

## 2020-05-03 DIAGNOSIS — Z950 Presence of cardiac pacemaker: Secondary | ICD-10-CM | POA: Diagnosis not present

## 2020-05-03 DIAGNOSIS — Z79899 Other long term (current) drug therapy: Secondary | ICD-10-CM | POA: Insufficient documentation

## 2020-05-03 DIAGNOSIS — R072 Precordial pain: Secondary | ICD-10-CM

## 2020-05-03 DIAGNOSIS — Z87891 Personal history of nicotine dependence: Secondary | ICD-10-CM | POA: Diagnosis not present

## 2020-05-03 DIAGNOSIS — Z7982 Long term (current) use of aspirin: Secondary | ICD-10-CM | POA: Insufficient documentation

## 2020-05-03 DIAGNOSIS — Z951 Presence of aortocoronary bypass graft: Secondary | ICD-10-CM | POA: Diagnosis not present

## 2020-05-03 DIAGNOSIS — E039 Hypothyroidism, unspecified: Secondary | ICD-10-CM | POA: Diagnosis not present

## 2020-05-03 DIAGNOSIS — Z7901 Long term (current) use of anticoagulants: Secondary | ICD-10-CM | POA: Insufficient documentation

## 2020-05-03 DIAGNOSIS — N189 Chronic kidney disease, unspecified: Secondary | ICD-10-CM | POA: Insufficient documentation

## 2020-05-03 DIAGNOSIS — I2510 Atherosclerotic heart disease of native coronary artery without angina pectoris: Secondary | ICD-10-CM | POA: Diagnosis not present

## 2020-05-03 DIAGNOSIS — I5023 Acute on chronic systolic (congestive) heart failure: Secondary | ICD-10-CM | POA: Diagnosis not present

## 2020-05-03 DIAGNOSIS — F606 Avoidant personality disorder: Secondary | ICD-10-CM | POA: Insufficient documentation

## 2020-05-03 DIAGNOSIS — I480 Paroxysmal atrial fibrillation: Secondary | ICD-10-CM | POA: Insufficient documentation

## 2020-05-03 LAB — COMPREHENSIVE METABOLIC PANEL
ALT: 32 U/L (ref 0–44)
AST: 31 U/L (ref 15–41)
Albumin: 3.8 g/dL (ref 3.5–5.0)
Alkaline Phosphatase: 64 U/L (ref 38–126)
Anion gap: 9 (ref 5–15)
BUN: 12 mg/dL (ref 8–23)
CO2: 32 mmol/L (ref 22–32)
Calcium: 9.1 mg/dL (ref 8.9–10.3)
Chloride: 101 mmol/L (ref 98–111)
Creatinine, Ser: 1.48 mg/dL — ABNORMAL HIGH (ref 0.61–1.24)
GFR, Estimated: 51 mL/min — ABNORMAL LOW (ref >60)
Glucose, Bld: 114 mg/dL — ABNORMAL HIGH (ref 70–99)
Potassium: 4.2 mmol/L (ref 3.5–5.1)
Sodium: 142 mmol/L (ref 135–145)
Total Bilirubin: 0.6 mg/dL (ref 0.3–1.2)
Total Protein: 6.8 g/dL (ref 6.5–8.1)

## 2020-05-03 LAB — CBC WITH DIFFERENTIAL/PLATELET
Abs Immature Granulocytes: 0.04 10*3/uL (ref 0.00–0.07)
Basophils Absolute: 0 10*3/uL (ref 0.0–0.1)
Basophils Relative: 0 %
Eosinophils Absolute: 0.1 10*3/uL (ref 0.0–0.5)
Eosinophils Relative: 1 %
HCT: 43.7 % (ref 39.0–52.0)
Hemoglobin: 14 g/dL (ref 13.0–17.0)
Immature Granulocytes: 1 %
Lymphocytes Relative: 22 %
Lymphs Abs: 1.9 10*3/uL (ref 0.7–4.0)
MCH: 30 pg (ref 26.0–34.0)
MCHC: 32 g/dL (ref 30.0–36.0)
MCV: 93.6 fL (ref 80.0–100.0)
Monocytes Absolute: 0.9 10*3/uL (ref 0.1–1.0)
Monocytes Relative: 11 %
Neutro Abs: 5.8 10*3/uL (ref 1.7–7.7)
Neutrophils Relative %: 65 %
Platelets: 161 10*3/uL (ref 150–400)
RBC: 4.67 MIL/uL (ref 4.22–5.81)
RDW: 15.1 % (ref 11.5–15.5)
WBC: 8.8 10*3/uL (ref 4.0–10.5)
nRBC: 0 % (ref 0.0–0.2)

## 2020-05-03 LAB — TROPONIN I (HIGH SENSITIVITY): Troponin I (High Sensitivity): 13 ng/L (ref <18)

## 2020-05-03 NOTE — ED Provider Notes (Signed)
MSE was initiated and I personally evaluated the patient and placed orders (if any) at  10:26 PM on May 03, 2020.   Patient with h/o CAD, COPD, HLD, presents with chest pain that started about one hour prior to arrival while having an argument with his roommate. The pain was left sided, non-radiating, constant but decreasing over time. Mild SOB. No nausea, diaphoresis. He received aspirin and NTG x 2 from EMS but does not feel this contributed to the decrease in pain.   Today's Vitals   05/03/20 2201  BP: 137/75  Pulse: (!) 59  Resp: (!) 26  Temp: 97.9 F (36.6 C)  TempSrc: Oral  SpO2: 100%   There is no height or weight on file to calculate BMI.  Appears in NAD VSS RRR Lungs clear  The patient appears stable so that the remainder of the MSE may be completed by another provider.   Elpidio Anis, PA-C 05/03/20 2229    Tilden Fossa, MD 05/03/20 [phone]

## 2020-05-03 NOTE — ED Triage Notes (Signed)
Patient coming from home with [facility], with L sided chest pain x 1.5 hours, reports he is on Eliquis, given 324 mg ASA and 2SL Nitro with relief, reports he wears 3L [place] at baseline

## 2020-05-04 LAB — TROPONIN I (HIGH SENSITIVITY): Troponin I (High Sensitivity): 13 ng/L (ref <18)

## 2020-05-04 NOTE — ED Provider Notes (Signed)
[facility] EMERGENCY DEPARTMENT Provider Note   CSN: [id number] Arrival date & time: 05/03/20  2149     History Chief Complaint  Patient presents with  . Chest Pain    Max Rivas is a 70 y.o. male.  The history is provided by the patient.  Chest Pain Pain location:  Substernal area Pain quality: dull   Pain radiates to:  Does not radiate Pain severity:  Moderate Onset quality:  Sudden Timing:  Constant Progression:  Unchanged Context: at rest and stress   Relieved by:  Nothing Worsened by:  Nothing Ineffective treatments:  Nitroglycerin Associated symptoms: anxiety   Associated symptoms: no abdominal pain, no AICD problem, no altered mental status, no anorexia, no back pain, no claudication, no cough, no diaphoresis, no dizziness, no dysphagia, no fatigue, no fever, no headache, no heartburn, no lower extremity edema, no nausea, no near-syncope, no numbness, no orthopnea, no palpitations, no PND, no shortness of breath, no syncope, no vomiting and no weakness   Risk factors: male sex   Patient with ongoing CP since an argument with someone.  No SOB, no n/v/d.       Past Medical History:  Diagnosis Date  . Arthritis    "elbows, knees" (02/24/2016)  . Bronchitis 2006  . CAD (coronary artery disease)    a. s/p prior MIs - 1995 x 2, 1998; b. s/p prior LCX stenting; c. 04/2019 Cath: LM min irregs, LAD 65p/mi, D1 75, LCX 99ost/p, 77m (ISR), OM2 80, RCA/RPDA mod diff dzs; d. 05/2019 CABG x 3: LIMA->LAD, VG->Diag, VG->OM.  Marland Kitchen COPD (chronic obstructive pulmonary disease) (HCC)    a. Remote tobacco-->on home O2.  Marland Kitchen GERD (gastroesophageal reflux disease)   . 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 05/2019 Echo: EF 40-45%, gr2 DD. Nl RV size/fxn. Mildly dil LA. Mild MR.  . High cholesterol   . Ischemic cardiomyopathy    a. 05/2019 Echo: EF 40-45%.  . Morbid obesity (HCC)   . Myocardial infarction [facility]) ~ 1995 X 2;1998; 2000; 2004   . On home oxygen therapy    "2L w/activity" (02/24/2016)  . PAF (paroxysmal atrial fibrillation) (HCC)    a. CHA2DS2VASc = 4-->eliquis. Also on amio.  . Pulmonary embolism (HCC) 02/24/2016  . Sleep apnea    "have CPAP; can't tolerate it" (02/24/2016)  . Ventricular tachycardia (HCC)    a. 2005 s/p ICD; b. 03/2011 Device upgrade/lead exchange: MDT Protecta XT VR single lead AICD.    Patient Active Problem List   Diagnosis Date Noted  . Ventricular tachyarrhythmia (HCC) 12/07/2019  . Cardiomyopathy, ischemic   . Vomiting   . Acute respiratory failure with hypoxia (HCC) 09/18/2019  . Acute on chronic heart failure (HCC) 09/08/2019  . Unstable angina (HCC) 05/21/2019  . Ventricular tachycardia (HCC) 05/12/2019  . Chest pain   . VT (ventricular tachycardia) (HCC) 05/11/2019  . Arrhythmia 03/12/2019  . On home O2   . Chronic HFrEF (heart failure with reduced ejection fraction) (HCC) 06/22/2018  . Chronic respiratory failure with hypoxia (HCC) 06/22/2018  . Congestive heart failure (CHF) (HCC) 06/22/2018  . DCM (dilated cardiomyopathy) (HCC) 05/25/2017  . Coronary artery disease 05/25/2017  . Chronic atrial fibrillation 05/25/2017  . COPD (chronic obstructive pulmonary disease) (HCC) 05/25/2017  . CKD (chronic kidney disease) 05/25/2017  . Hypothyroidism 05/25/2017  . 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 OSA (obstructive sleep apnea) 05/25/2017  . Pulmonary HTN (HCC) 05/25/2017  . Mediastinal  adenopathy   . Cervical adenopathy   . Acute respiratory failure with hypoxia and hypercarbia (HCC) 02/24/2016  . Pulmonary embolus (HCC) 02/24/2016  . Pulmonary artery thrombosis (HCC)   . Pleural effusion   . Lung mass   . Pleural plaque   . Flutter-fibrillation 01/15/2016  . Acute systolic congestive heart failure (HCC)   . Atrial fibrillation with RVR (HCC)   . Hypercholesterolemia 03/11/2006  . Tobacco abuse 03/11/2006  . Acute on chronic systolic  congestive heart failure (HCC) 03/11/2006  . GASTROESOPHAGEAL REFLUX, NO ESOPHAGITIS 03/11/2006  . OSTEOARTHRITIS, MULTI SITES 03/11/2006  . HIGH RISK PATIENT 03/11/2006  . Tobacco dependence 03/11/2006    Past Surgical History:  Procedure Laterality Date  . CARDIOVERSION N/A 09/11/2019   Procedure: CARDIOVERSION;  Surgeon: Jodelle Red, MD;  Location: [facility] ENDOSCOPY;  Service: Cardiovascular;  Laterality: N/A;  . CLIPPING OF ATRIAL APPENDAGE N/A 05/23/2019   Procedure: CLIPPING OF ATRIAL APPENDAGE with atriclip;  Surgeon: [name], MD;  Location[facility];  Service: Open Heart Surgery;  Laterality: N/A;  . CORONARY ANGIOPLASTY  1995  . CORONARY ANGIOPLASTY WITH STENT PLACEMENT  ~ 1995 - 2004 X 5   "I've got a total of 5 stents" (02/24/2016)  . CORONARY ARTERY BYPASS GRAFT N/A 05/23/2019   Procedure: CORONARY ARTERY BYPASS GRAFTING (CABG), times three, using right greater saphenous vein and left internal mammary;  Surgeon: [name], MD;  Location: [facility] OR;  Service: Open Heart Surgery;  Laterality: N/A;  [place] only  . 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 INTRAVASCULAR PRESSURE WIRE/FFR STUDY N/A 05/12/2019   Procedure: INTRAVASCULAR PRESSURE WIRE/FFR STUDY;  Surgeon: Lyn Records, MD;  Location: [facility] INVASIVE CV LAB;  Service: Cardiovascular;  Laterality: N/A;  . LEFT HEART CATH AND CORONARY ANGIOGRAPHY N/A 05/12/2019   Procedure: LEFT HEART CATH AND CORONARY ANGIOGRAPHY;  Surgeon: Lyn Records, MD;  Location: [facility] INVASIVE CV LAB;  Service: Cardiovascular;  Laterality: N/A;  . PACEMAKER GENERATOR CHANGE  03/2011    [facility] [place]  . TEE WITHOUT CARDIOVERSION N/A 05/23/2019   Procedure: TRANSESOPHAGEAL ECHOCARDIOGRAM (TEE);  Surgeon: [name], MD;  Location: [facility] OR;  Service: Open Heart Surgery;  Laterality: N/A;  . TONSILLECTOMY         Family History  Problem Relation Age of Onset  . Heart attack Mother   .  Heart attack Father   . Heart attack Sister 29    Social History   Tobacco Use  . Smoking status: Former Smoker    Packs/day: 3.00    Years: 35.00    Pack years: 105.00    Types: Cigarettes    Quit date: 05/22/2019    Years since quitting: 0.9  . Smokeless tobacco: Former Neurosurgeon    Quit date: 05/21/2017  Vaping Use  . Vaping Use: Never used  Substance Use Topics  . Alcohol use: Not Currently  . Drug use: Not Currently    Types: Cocaine    Comment: none since 1995    Home Medications Prior to Admission medications   Medication Sig Start Date End Date Taking? Authorizing Provider  acetaminophen (TYLENOL) 325 MG tablet Take 2 tablets (650 mg total) by mouth every 6 (six) hours as needed for mild pain (or Fever >/= 101). Patient not taking: No sig reported 03/05/16   Alison Murray, MD  acetaminophen (TYLENOL) 500 MG tablet Take 500-1,000 mg by  mouth every 6 (six) hours as needed for moderate pain, headache or mild pain.    [provider]  amiodarone (PACERONE) 400 MG tablet Take 1 tablet (400 mg total) by mouth 2 (two) times daily. 03/19/20   Graciella Freer, PA-C  apixaban (ELIQUIS) 5 MG TABS tablet Take 5 mg by mouth 2 (two) times daily.    [provider]  aspirin 81 MG chewable tablet Chew 1 tablet (81 mg total) by mouth daily. 05/16/19   Kroeger, Ovidio Kin., PA-C  atorvastatin (LIPITOR) 80 MG tablet Take 1 tablet (80 mg total) by mouth daily. Patient not taking: No sig reported 05/15/19   Beatriz Stallion., PA-C  Cholecalciferol (VITAMIN D3) 50 MCG (2000 UT) TABS Take 2,000 Units by mouth daily.    [provider]  furosemide (LASIX) 40 MG tablet Take 40 mg by mouth 2 (two) times daily.     [provider]  gabapentin (NEURONTIN) 100 MG capsule Take 2 capsules (200 mg total) by mouth 2 (two) times daily. 06/01/19   Barrett, Erin R, PA-C  levalbuterol (XOPENEX HFA) 45 MCG/ACT inhaler Inhale 2 puffs into the lungs every 6 (six) hours as needed  for wheezing or shortness of breath.    [provider]  levothyroxine (SYNTHROID) 75 MCG tablet Take 75 mcg by mouth daily before breakfast.    [provider]  metoprolol succinate (TOPROL-XL) 25 MG 24 hr tablet Take 12.5 mg by mouth in the morning.    [provider]  mexiletine (MEXITIL) 150 MG capsule Take 2 capsules (300 mg total) by mouth 2 (two) times daily. Patient taking differently: Take 150 mg by mouth 2 (two) times daily. 12/07/19   Filbert Schilder, NP  mometasone [facility]) 220 MCG/INH inhaler Inhale 2 puffs into the lungs 2 (two) times daily.    [provider]  montelukast (SINGULAIR) 10 MG tablet Take 10 mg by mouth at bedtime.    [provider]  Multiple Vitamin (MULTIVITAMIN WITH MINERALS) TABS tablet Take 1 tablet by mouth daily with breakfast.    [provider]  nitroGLYCERIN (NITROSTAT) 0.4 MG SL tablet Place 0.4 mg under the tongue every 5 (five) minutes as needed for chest pain.     [provider]  omega-3 acid ethyl esters (LOVAZA) 1 g capsule Take 1 capsule (1 g total) by mouth 2 (two) times daily. Patient not taking: Reported on 03/18/2020 03/05/16   Alison Murray, MD  Omega-3 Fatty Acids (FISH OIL) 1000 MG CAPS Take 1,000 mg by mouth in the morning and at bedtime.    [provider]  omeprazole (PRILOSEC) 20 MG capsule Take 20 mg by mouth in the morning and at bedtime.    [provider]  potassium chloride SA (KLOR-CON) 20 MEQ tablet Take 1 tablet (20 mEq total) by mouth 2 (two) times daily. 03/19/20   Graciella Freer, PA-C  Propylene Glycol (SYSTANE BALANCE) 0.6 % SOLN Place 1 drop into both eyes See admin instructions. Place 1 drop into each eye four times a day and apply a warm compress for 10 minutes afterwards     [provider]  rosuvastatin (CRESTOR) 40 MG tablet Take 20 mg by mouth at bedtime.    [provider]  Tiotropium Bromide-Olodaterol 2.5-2.5 MCG/ACT  AERS Inhale 1 puff into the lungs in the morning and at bedtime.    [provider]    Allergies    Crestor [rosuvastatin calcium], Rosuvastatin, Albuterol, and Atorvastatin  Review of Systems   Review of Systems  Constitutional: Negative for diaphoresis, fatigue and fever.  HENT: Negative for trouble swallowing.   Eyes: Negative for visual disturbance.  Respiratory: Negative for cough and shortness of breath.   Cardiovascular: Positive for chest pain. Negative for palpitations, orthopnea, claudication, syncope, PND and near-syncope.  Gastrointestinal: Negative for abdominal pain, anorexia, heartburn, nausea and vomiting.  Musculoskeletal: Negative for back pain.  Neurological: Negative for dizziness, weakness, numbness and headaches.  All other systems reviewed and are negative.   Physical Exam Updated Vital Signs BP 115/60   Pulse 82   Temp 98.9 F (37.2 C)   Resp 15   Ht 5\' 11"  (1.803 m)   Wt 92 kg   SpO2 99%   BMI 28.29 kg/m   Physical Exam Vitals and nursing note reviewed.  Constitutional:      General: He is not in acute distress.    Appearance: Normal appearance. He is not diaphoretic.  HENT:     Head: Normocephalic and atraumatic.     Nose: Nose normal.  Eyes:     Conjunctiva/sclera: Conjunctivae normal.     Pupils: Pupils are equal, round, and reactive to light.  Cardiovascular:     Rate and Rhythm: Normal rate and regular rhythm.     Pulses: Normal pulses.     Heart sounds: Normal heart sounds.  Pulmonary:     Effort: Pulmonary effort is normal.     Breath sounds: Normal breath sounds.  Abdominal:     General: Abdomen is flat. Bowel sounds are normal.     Palpations: Abdomen is soft.     Tenderness: There is no abdominal tenderness. There is no guarding.  Musculoskeletal:        General: Normal range of motion.     Cervical back: Normal range of motion and neck supple.  Skin:    General: Skin is warm and dry.     Capillary Refill:  Capillary refill takes less than 2 seconds.  Neurological:     General: No focal deficit present.     Mental Status: He is alert and oriented to person, place, and time.     Deep Tendon Reflexes: Reflexes normal.  Psychiatric:        Mood and Affect: Mood is anxious.     ED Results / Procedures / Treatments   Labs (all labs ordered are listed, but only abnormal results are displayed) Results for orders placed or performed during the hospital encounter of 05/03/20  CBC with Differential  Result Value Ref Range   WBC 8.8 4.0 - 10.5 K/uL   RBC 4.67 4.22 - 5.81 MIL/uL   Hemoglobin 14.0 13.0 - 17.0 g/dL   HCT 03.443.7 74.239.0 - 59.552.0 %   MCV 93.6 80.0 - 100.0 fL   MCH 30.0 26.0 - 34.0 pg   MCHC 32.0 30.0 - 36.0 g/dL   RDW 63.815.1 75.611.5 - 43.315.5 %   Platelets 161 150 - 400 K/uL   nRBC 0.0 0.0 - 0.2 %   Neutrophils Relative % 65 %   Neutro Abs 5.8 1.7 - 7.7 K/uL   Lymphocytes Relative 22 %   Lymphs Abs 1.9 0.7 - 4.0 K/uL   Monocytes Relative 11 %   Monocytes Absolute 0.9 0.1 - 1.0 K/uL   Eosinophils Relative 1 %   Eosinophils Absolute 0.1 0.0 - 0.5 K/uL   Basophils Relative 0 %   Basophils Absolute 0.0 0.0 - 0.1 K/uL   Immature Granulocytes 1 %  Abs Immature Granulocytes 0.04 0.00 - 0.07 K/uL  Comprehensive metabolic panel  Result Value Ref Range   Sodium 142 135 - 145 mmol/L   Potassium 4.2 3.5 - 5.1 mmol/L   Chloride 101 98 - 111 mmol/L   CO2 32 22 - 32 mmol/L   Glucose, Bld 114 (H) 70 - 99 mg/dL   BUN 12 8 - 23 mg/dL   Creatinine, Ser 5.59 (H) 0.61 - 1.24 mg/dL   Calcium 9.1 8.9 - 74.1 mg/dL   Total Protein 6.8 6.5 - 8.1 g/dL   Albumin 3.8 3.5 - 5.0 g/dL   AST 31 15 - 41 U/L   ALT 32 0 - 44 U/L   Alkaline Phosphatase 64 38 - 126 U/L   Total Bilirubin 0.6 0.3 - 1.2 mg/dL   GFR, Estimated 51 (L) >60 mL/min   Anion gap 9 5 - 15  Troponin I (High Sensitivity)  Result Value Ref Range   Troponin I (High Sensitivity) 13 <18 ng/L  Troponin I (High Sensitivity)  Result Value Ref  Range   Troponin I (High Sensitivity) 13 <18 ng/L   DG Chest 2 View  Result Date: 05/03/2020 CLINICAL DATA:  Left-sided chest pain for 1.5 hours. EXAM: CHEST - 2 VIEW COMPARISON:  03/18/2020 FINDINGS: Postoperative changes in the mediastinum. Cardiac pacemaker. Heart size and pulmonary vascularity are normal. Calcified pleural plaques bilaterally. Pleural thickening in the costophrenic angles. No definite consolidation or pneumothorax. Appearances are similar to prior studies. Emphysematous changes in the lungs. Mediastinal contours appear intact. IMPRESSION: No active cardiopulmonary disease. Electronically Signed   By: Burman Nieves M.D.   On: 05/03/2020 22:53    EKG EKG Interpretation  Date/Time:  Friday Jaeleen Inzunza 22 2022 22:38:08 EDT Ventricular Rate:  65 PR Interval:  216 QRS Duration: 106 QT Interval:  492 QTC Calculation: 511 R Axis:   44 Text Interpretation: Sinus rhythm with marked sinus arrhythmia with 1st degree A-V block Cannot rule out Inferior infarct , age undetermined Prolonged QT Confirmed by Tyriek Hofman ([id number]) on 05/03/2020 11:24:22 PM   Radiology DG Chest 2 View  Result Date: 05/03/2020 CLINICAL DATA:  Left-sided chest pain for 1.5 hours. EXAM: CHEST - 2 VIEW COMPARISON:  03/18/2020 FINDINGS: Postoperative changes in the mediastinum. Cardiac pacemaker. Heart size and pulmonary vascularity are normal. Calcified pleural plaques bilaterally. Pleural thickening in the costophrenic angles. No definite consolidation or pneumothorax. Appearances are similar to prior studies. Emphysematous changes in the lungs. Mediastinal contours appear intact. IMPRESSION: No active cardiopulmonary disease. Electronically Signed   By: Burman Nieves M.D.   On: 05/03/2020 22:53    Procedures Procedures   Medications Ordered in ED Medications - No data to display  ED Course  I have reviewed the triage vital signs and the nursing notes.  Pertinent labs & imaging results that were  available during my care of the patient were reviewed by me and considered in my medical decision making (see chart for details).    Ongoing chest pain with 2 negative troponins.  I suspect this is anxiety.  Follow up with your cardiology.  Avoid stress strict return precautions given.    Max Rivas was evaluated in Emergency Department on 05/04/2020 for the symptoms described in the history of present illness. He was evaluated in the context of the global COVID-19 pandemic, which necessitated consideration that the patient might be at risk for infection with the SARS-CoV-2 virus that causes COVID-19. Institutional protocols and algorithms that pertain to the evaluation of patients  at risk for COVID-19 are in a state of rapid change based on information released by regulatory bodies including the CDC and federal and state organizations. These policies and algorithms were followed during the patient's care in the ED.  Final Clinical Impression(s) / ED Diagnoses  Return for intractable cough, coughing up blood, fevers >100.4 unrelieved by medication, shortness of breath, intractable vomiting, chest pain, shortness of breath, weakness, numbness, changes in speech, facial asymmetry, abdominal pain, passing out, Inability to tolerate liquids or food, cough, altered mental status or any concerns. No signs of systemic illness or infection. The patient is nontoxic-appearing on exam and vital signs are within normal limits.  I have reviewed the triage vital signs and the nursing notes. Pertinent labs & imaging results that were available during my care of the patient were reviewed by me and considered in my medical decision making (see chart for details). After history, exam, and medical workup I feel the patient has been appropriately medically screened and is safe for discharge home. Pertinent diagnoses were discussed with the patient. Patient was given return precautions.    Bodi Palmeri,  MD 05/04/20 2229

## 2020-05-04 NOTE — Discharge Instructions (Signed)

## 2020-05-04 NOTE — ED Notes (Signed)
Max Rivas [phone]

## 2020-05-05 DIAGNOSIS — Z5189 Encounter for other specified aftercare: Secondary | ICD-10-CM | POA: Diagnosis not present

## 2020-05-09 DIAGNOSIS — J449 Chronic obstructive pulmonary disease, unspecified: Secondary | ICD-10-CM | POA: Diagnosis not present

## 2020-05-17 DIAGNOSIS — K047 Periapical abscess without sinus: Secondary | ICD-10-CM | POA: Diagnosis not present

## 2020-05-23 DIAGNOSIS — Z01812 Encounter for preprocedural laboratory examination: Secondary | ICD-10-CM | POA: Diagnosis not present

## 2020-05-23 DIAGNOSIS — Z01810 Encounter for preprocedural cardiovascular examination: Secondary | ICD-10-CM | POA: Diagnosis not present

## 2020-05-23 DIAGNOSIS — R9431 Abnormal electrocardiogram [ECG] [EKG]: Secondary | ICD-10-CM | POA: Diagnosis not present

## 2020-06-03 DIAGNOSIS — Z951 Presence of aortocoronary bypass graft: Secondary | ICD-10-CM | POA: Diagnosis not present

## 2020-06-03 DIAGNOSIS — Z7901 Long term (current) use of anticoagulants: Secondary | ICD-10-CM | POA: Diagnosis not present

## 2020-06-03 DIAGNOSIS — Z87891 Personal history of nicotine dependence: Secondary | ICD-10-CM | POA: Diagnosis not present

## 2020-06-03 DIAGNOSIS — I255 Ischemic cardiomyopathy: Secondary | ICD-10-CM | POA: Diagnosis not present

## 2020-06-03 DIAGNOSIS — I2510 Atherosclerotic heart disease of native coronary artery without angina pectoris: Secondary | ICD-10-CM | POA: Diagnosis not present

## 2020-06-03 DIAGNOSIS — J449 Chronic obstructive pulmonary disease, unspecified: Secondary | ICD-10-CM | POA: Diagnosis not present

## 2020-06-03 DIAGNOSIS — I472 Ventricular tachycardia: Secondary | ICD-10-CM | POA: Diagnosis not present

## 2020-06-04 DIAGNOSIS — I2510 Atherosclerotic heart disease of native coronary artery without angina pectoris: Secondary | ICD-10-CM | POA: Diagnosis not present

## 2020-06-04 DIAGNOSIS — I472 Ventricular tachycardia: Secondary | ICD-10-CM | POA: Diagnosis not present

## 2020-06-04 DIAGNOSIS — Z951 Presence of aortocoronary bypass graft: Secondary | ICD-10-CM | POA: Diagnosis not present

## 2020-06-04 DIAGNOSIS — J449 Chronic obstructive pulmonary disease, unspecified: Secondary | ICD-10-CM | POA: Diagnosis not present

## 2020-06-04 DIAGNOSIS — Z7901 Long term (current) use of anticoagulants: Secondary | ICD-10-CM | POA: Diagnosis not present

## 2020-06-04 DIAGNOSIS — Z87891 Personal history of nicotine dependence: Secondary | ICD-10-CM | POA: Diagnosis not present

## 2020-06-04 DIAGNOSIS — I255 Ischemic cardiomyopathy: Secondary | ICD-10-CM | POA: Diagnosis not present

## 2020-06-08 DIAGNOSIS — J449 Chronic obstructive pulmonary disease, unspecified: Secondary | ICD-10-CM | POA: Diagnosis not present

## 2020-06-12 IMAGING — CR DG CHEST 2V
2 series · 2 of 2 positions shown · non-contrast
Comparison: 05/10/2018, 03/12/2019 and 06/21/2018 as well as chest
CT 07/25/2018

CLINICAL DATA: Chest pain since yesterday.

EXAM:
CHEST - 2 VIEW

[chest pa]
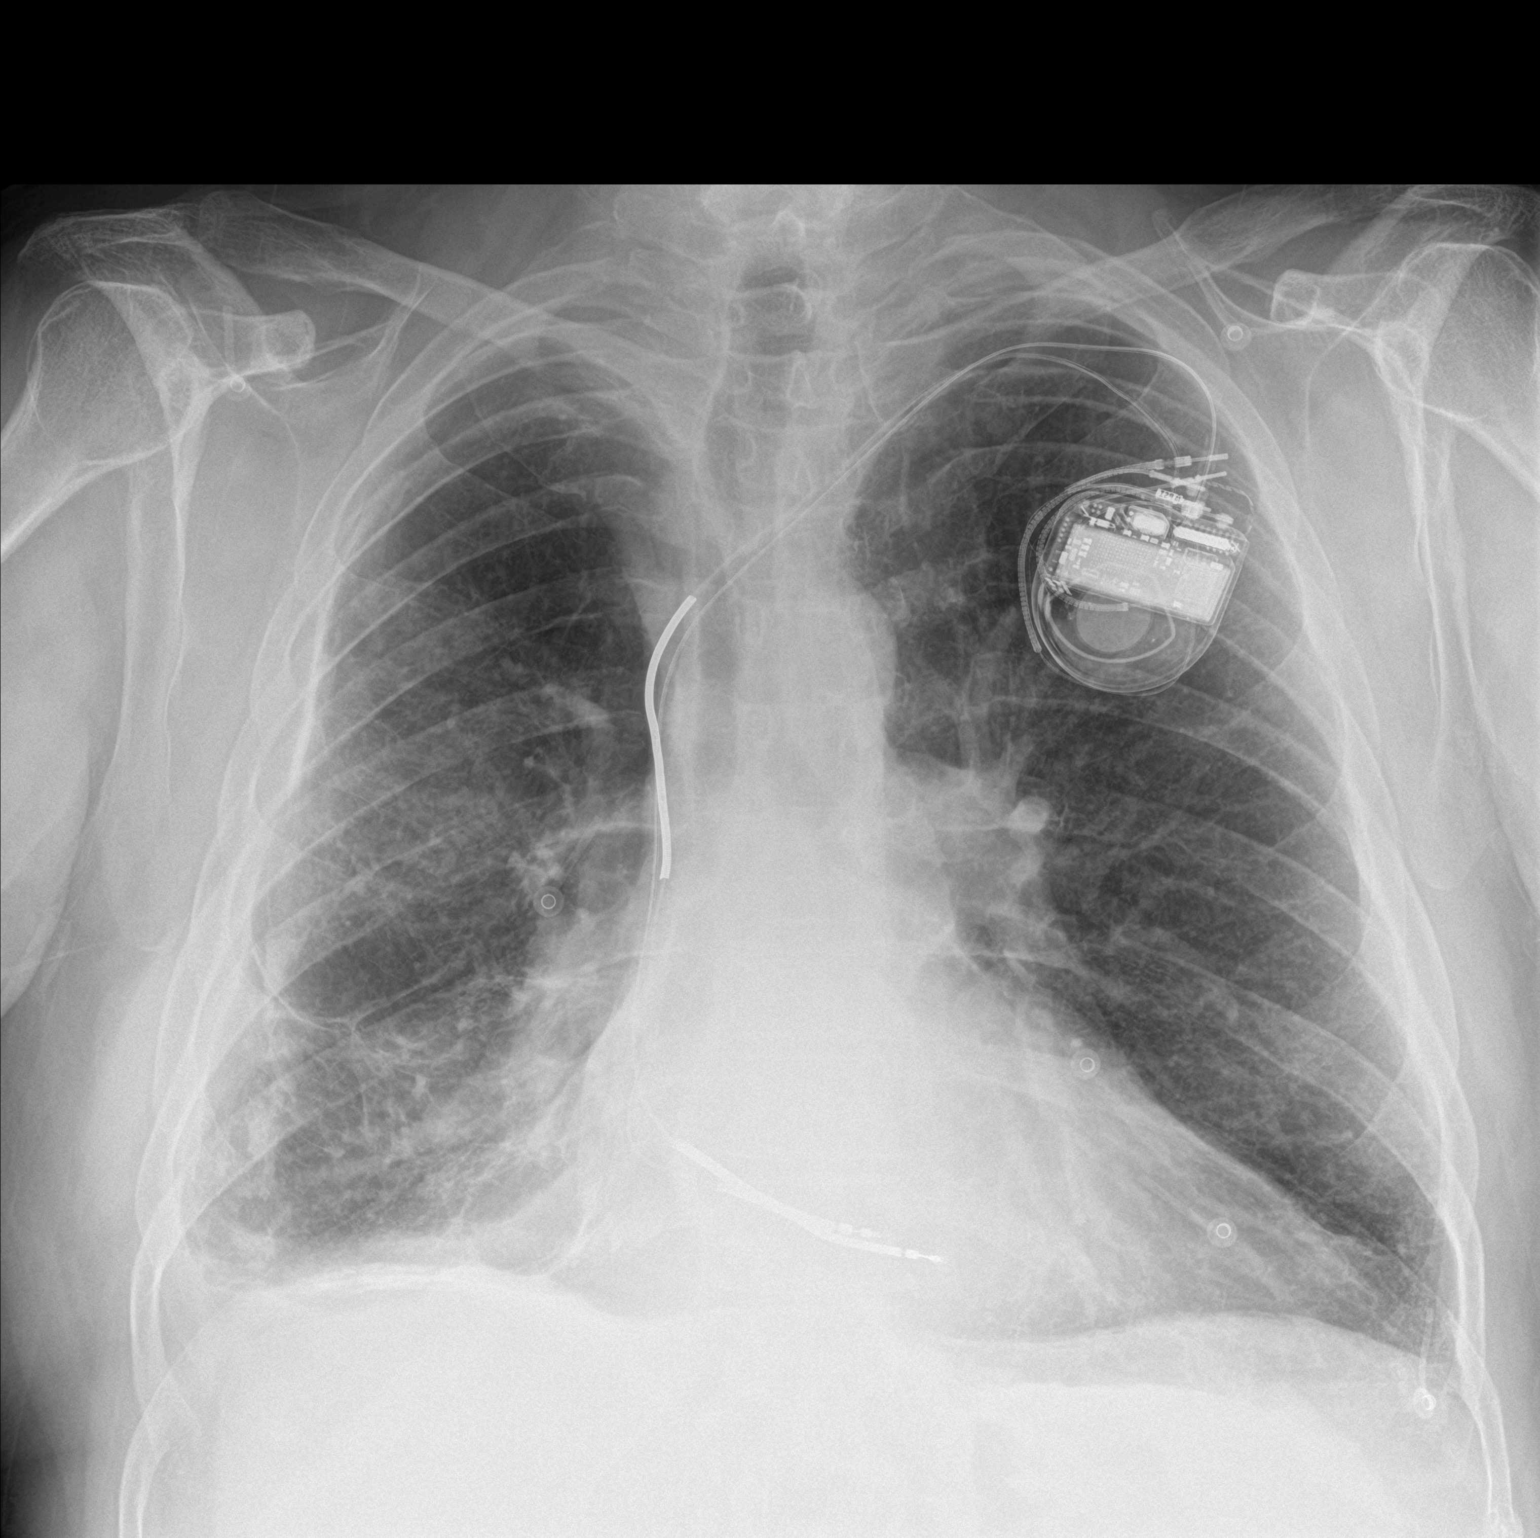

[chest lat]
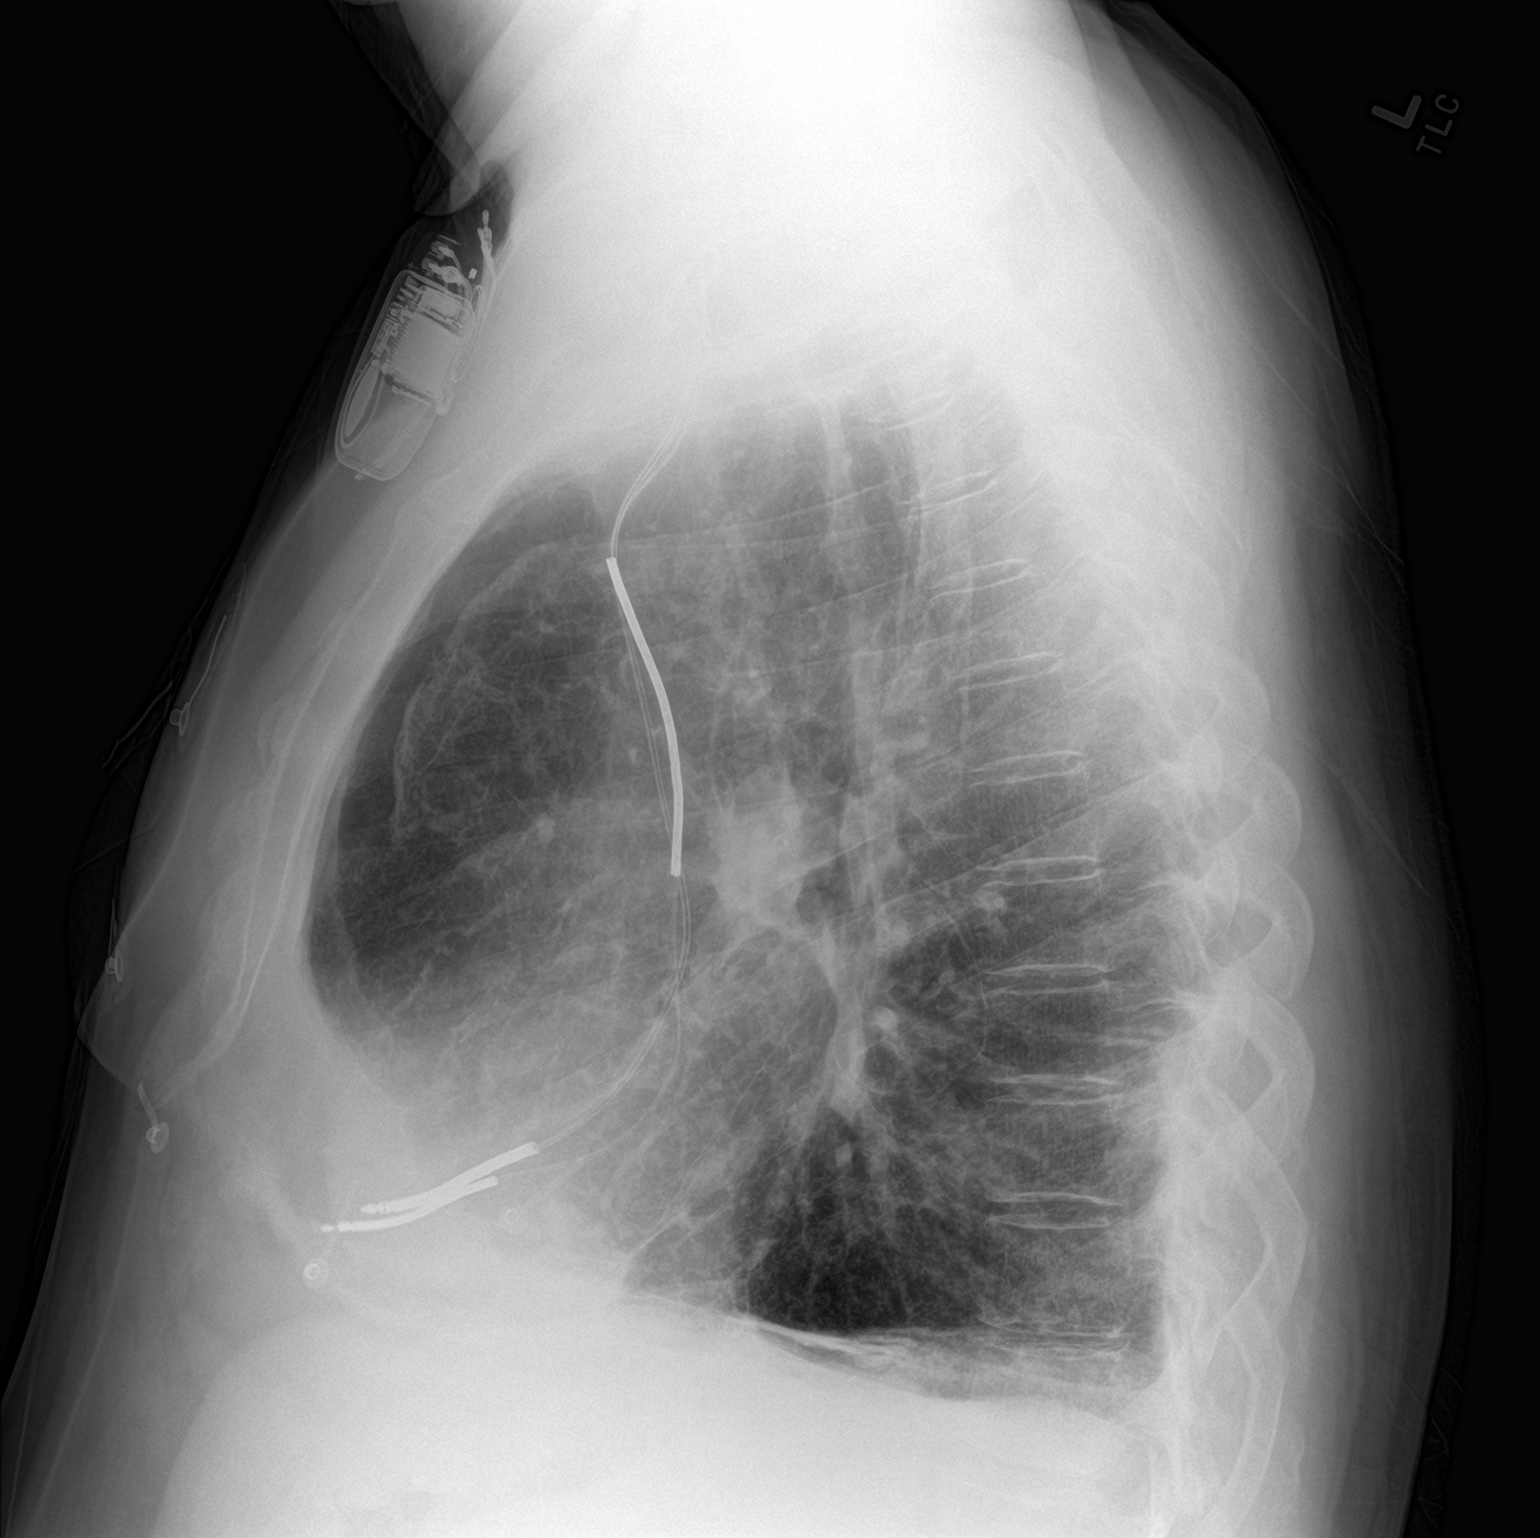

[2 of 2 positions shown; findings below may reference images not displayed]

FINDINGS: Left-sided pacemaker unchanged. Lungs are adequately inflated
demonstrate chronic changes over the right mid to lower lung with
pleural calcification present. No acute airspace process or
effusion. Left lung is clear. Mild stable cardiomegaly. Remainder of
the exam is unchanged.
IMPRESSION: 1.  No acute findings.

2. Chronic stable findings over the right mid to lower lung
including significant pleural calcification.

## 2020-07-03 DIAGNOSIS — J439 Emphysema, unspecified: Secondary | ICD-10-CM | POA: Diagnosis not present

## 2020-07-03 DIAGNOSIS — I10 Essential (primary) hypertension: Secondary | ICD-10-CM | POA: Diagnosis not present

## 2020-07-03 DIAGNOSIS — I509 Heart failure, unspecified: Secondary | ICD-10-CM | POA: Diagnosis not present

## 2020-07-03 DIAGNOSIS — Z0000 Encounter for general adult medical examination without abnormal findings: Secondary | ICD-10-CM | POA: Diagnosis not present

## 2020-07-03 DIAGNOSIS — G4733 Obstructive sleep apnea (adult) (pediatric): Secondary | ICD-10-CM | POA: Diagnosis not present

## 2020-07-09 DIAGNOSIS — J449 Chronic obstructive pulmonary disease, unspecified: Secondary | ICD-10-CM | POA: Diagnosis not present

## 2020-07-11 ENCOUNTER — Other Ambulatory Visit: Payer: Self-pay

## 2020-07-30 IMAGING — CR DG CHEST 2V
2 series · 2 of 2 positions shown · non-contrast
Comparison: Chest x-rays dated 05/28/2019 and 05/11/2019 and chest
CT dated 07/25/2018

CLINICAL DATA: Shortness of breath.  CABG on 05/23/2019.

EXAM:
CHEST - 2 VIEW

[w chest pa]
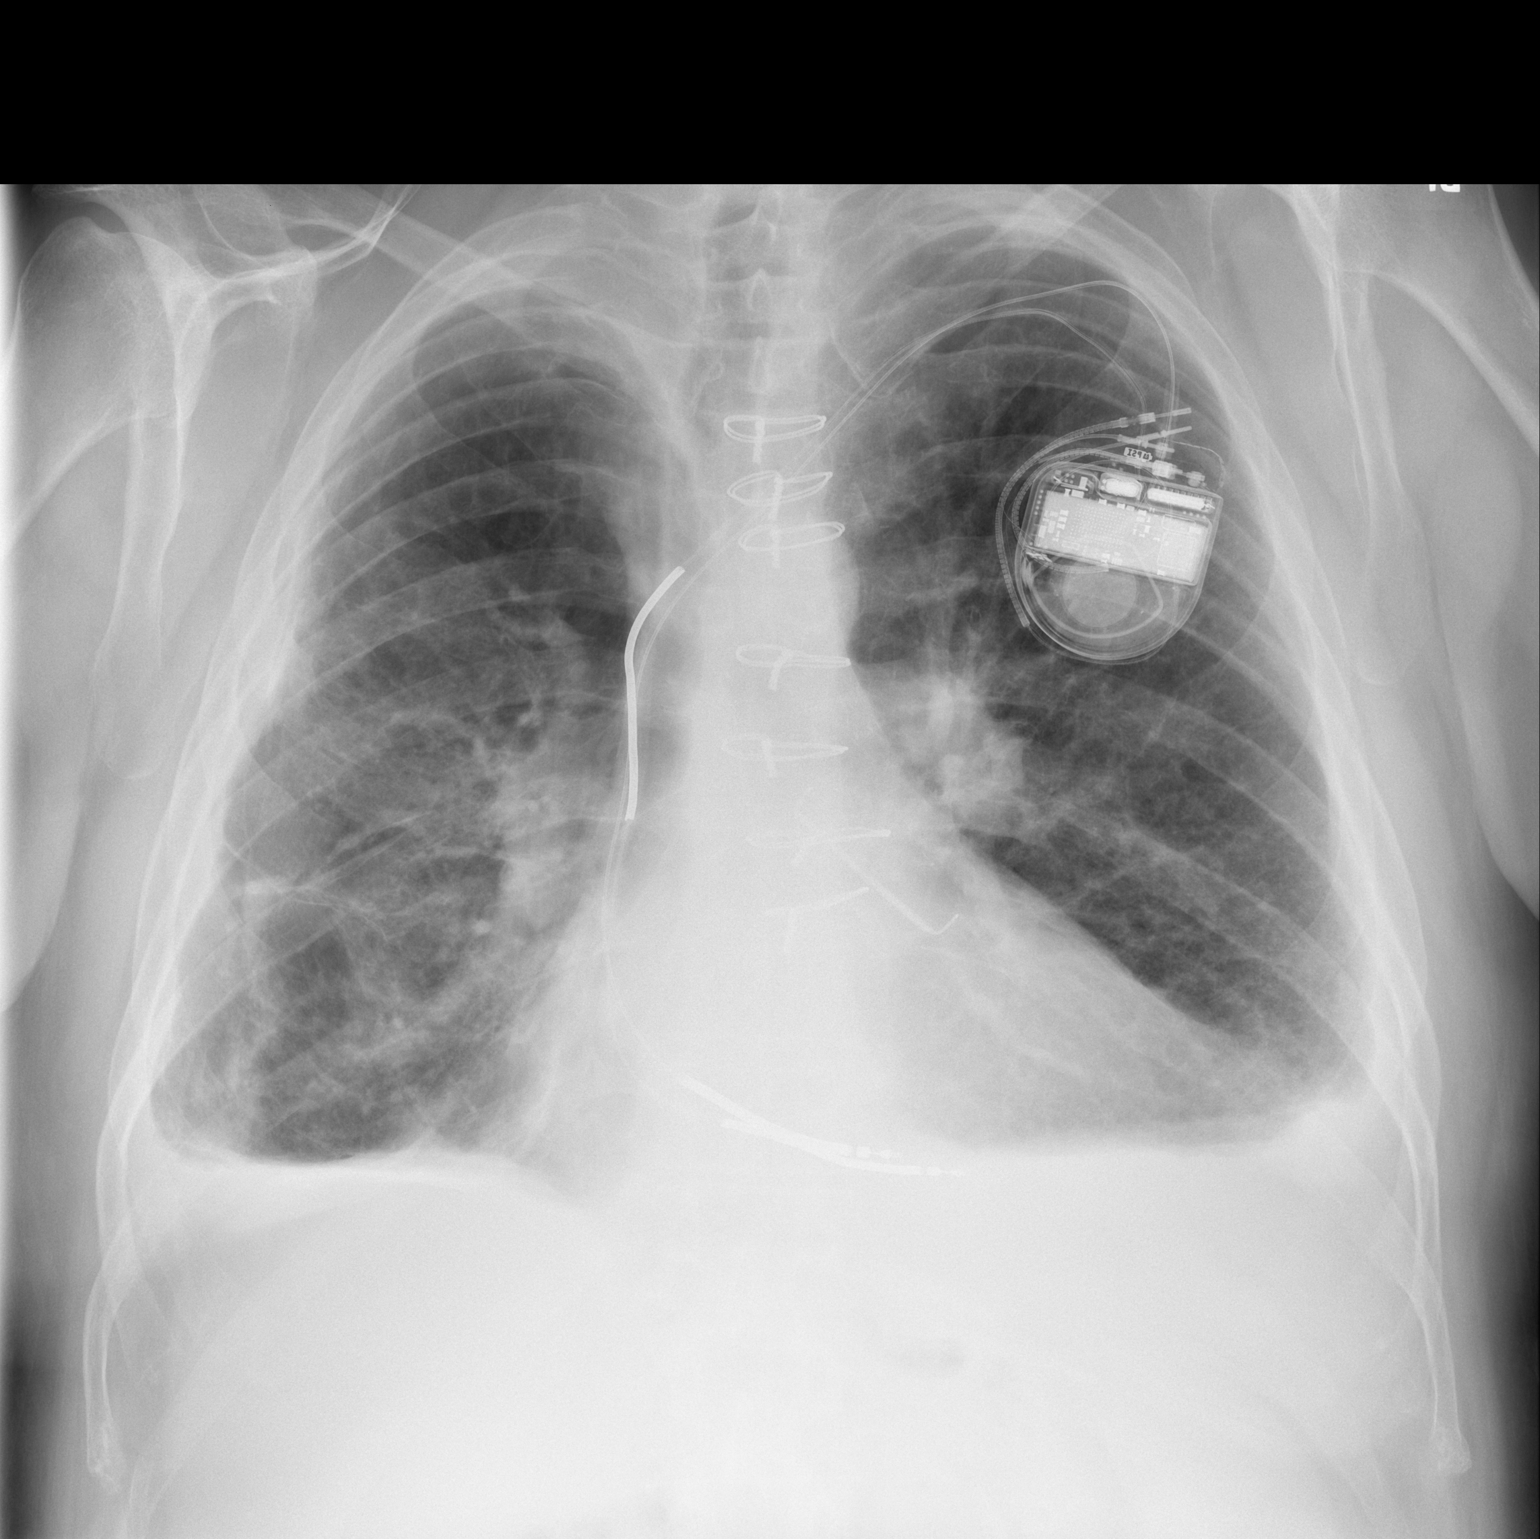

[w chest lat]
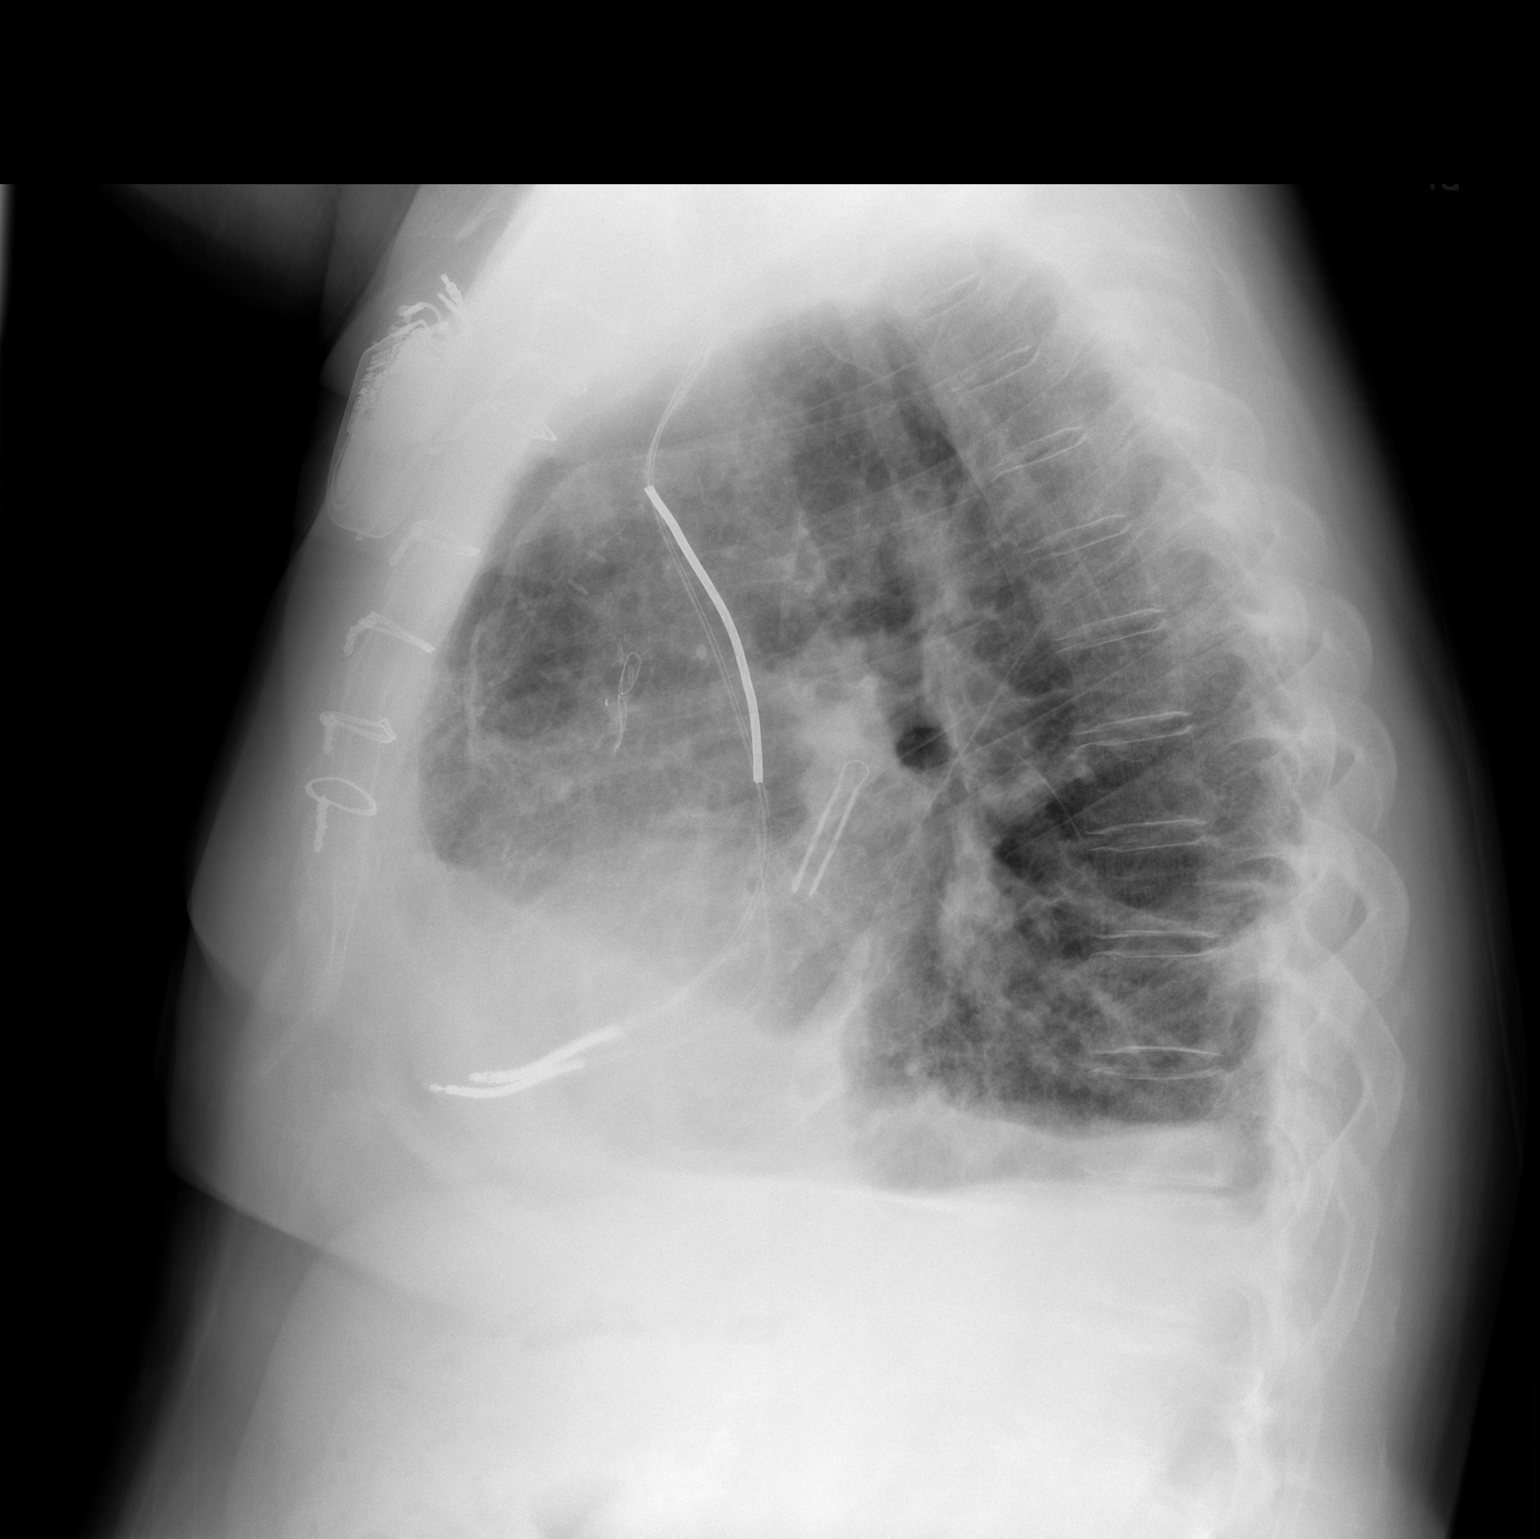

[2 of 2 positions shown; findings below may reference images not displayed]

FINDINGS: Cardiac silhouette is normal. Chronic prominence of the main
pulmonary arteries suggesting pulmonary arterial hypertension. Small
residual left pleural effusion, diminished since the prior study.
Chronic blunting of the right costophrenic angle laterally. Chronic
pleural calcifications on the right. Haziness overlying the right
midzone is felt to represent pleural calcifications, unchanged.
Scarring in both lungs.

AICD in place. CABG. Clip on the left atrial appendage. No
significant bone abnormality.
IMPRESSION: 1. Small residual left pleural effusion. Resolution of slight
atelectasis at the lung bases.
2. No other significant change. Chronic pleural calcifications.
Scarring in both lungs.

## 2020-08-08 DIAGNOSIS — J449 Chronic obstructive pulmonary disease, unspecified: Secondary | ICD-10-CM | POA: Diagnosis not present

## 2020-08-22 ENCOUNTER — Encounter (HOSPITAL_COMMUNITY): Payer: Self-pay | Admitting: *Deleted

## 2020-08-22 NOTE — Progress Notes (Signed)
Received from the [place] care in the community authorization [id number] [employer] for this pt to participate in Cardiac rehab with the diagnosis of Ventricular Tachycardia.  Reviewed the accompany complex medical history and progress notes  from clinic visit on 8/4.12 lead ekg tracing verified 08/15/20. Noted in his history he wears O2 therapy. Attempted to contact for parameters for his O2 saturation and permission to titrate his oxygen,the care in the community coordinator amanda.  I was unable to reach anyone on the coordinator line or the main line for the [facility].  Will try back again later. Once we receive guidance for he Oxygen he will be scheduled when able as there is a wait list.  Will forward to support staff for eventual contact. Alanson Aly, BSN Cardiac and Pulmonary Rehab Nurse Navigator '

## 2020-08-27 ENCOUNTER — Telehealth (HOSPITAL_COMMUNITY): Payer: Self-pay

## 2020-08-27 NOTE — Telephone Encounter (Signed)
Pt called and left a VM wanting to scheduling for cardiac rehab, called pt back and advised pt that we have received his referral [facility] auth for cardiac rehab and that we are waiting for some documents from the [place] before we can schedule him. Pt understood.

## 2020-09-08 DIAGNOSIS — J449 Chronic obstructive pulmonary disease, unspecified: Secondary | ICD-10-CM | POA: Diagnosis not present

## 2020-09-10 NOTE — Telephone Encounter (Signed)
Pt called back wanting to know have we received his order for his oxygen from the [place] I advised pt that we have not received it yet and that we have contacted Marchelle Folks the coordinator and we haven't heard or received pt stated he is going to call Amanda's boss and see whats going on I advised pt to let us know if he hears anything pt stated that he will.

## 2020-10-09 DIAGNOSIS — J449 Chronic obstructive pulmonary disease, unspecified: Secondary | ICD-10-CM | POA: Diagnosis not present

## 2020-10-28 ENCOUNTER — Telehealth (HOSPITAL_COMMUNITY): Payer: Self-pay

## 2020-10-28 NOTE — Telephone Encounter (Signed)
No repsonse from pt.  Closed referral  

## 2020-11-08 DIAGNOSIS — J449 Chronic obstructive pulmonary disease, unspecified: Secondary | ICD-10-CM | POA: Diagnosis not present

## 2021-01-03 ENCOUNTER — Emergency Department (HOSPITAL_COMMUNITY): Payer: Medicare Other

## 2021-01-03 ENCOUNTER — Encounter (HOSPITAL_COMMUNITY): Payer: Self-pay

## 2021-01-03 ENCOUNTER — Emergency Department (HOSPITAL_COMMUNITY)
Admission: EM | Admit: 2021-01-03 | Discharge: 2021-01-03 | Disposition: A | Discharge status: Home / Self Care | Payer: Medicare Other | Attending: Emergency Medicine | Admitting: Emergency Medicine

## 2021-01-03 ENCOUNTER — Other Ambulatory Visit: Payer: Self-pay

## 2021-01-03 DIAGNOSIS — I5021 Acute systolic (congestive) heart failure: Secondary | ICD-10-CM | POA: Diagnosis not present

## 2021-01-03 DIAGNOSIS — J449 Chronic obstructive pulmonary disease, unspecified: Secondary | ICD-10-CM | POA: Insufficient documentation

## 2021-01-03 DIAGNOSIS — Z79899 Other long term (current) drug therapy: Secondary | ICD-10-CM | POA: Diagnosis not present

## 2021-01-03 DIAGNOSIS — E039 Hypothyroidism, unspecified: Secondary | ICD-10-CM | POA: Insufficient documentation

## 2021-01-03 DIAGNOSIS — R200 Anesthesia of skin: Secondary | ICD-10-CM | POA: Diagnosis present

## 2021-01-03 DIAGNOSIS — Z87891 Personal history of nicotine dependence: Secondary | ICD-10-CM | POA: Insufficient documentation

## 2021-01-03 DIAGNOSIS — Z7982 Long term (current) use of aspirin: Secondary | ICD-10-CM | POA: Diagnosis not present

## 2021-01-03 DIAGNOSIS — G459 Transient cerebral ischemic attack, unspecified: Secondary | ICD-10-CM

## 2021-01-03 DIAGNOSIS — Z20822 Contact with and (suspected) exposure to covid-19: Secondary | ICD-10-CM | POA: Diagnosis not present

## 2021-01-03 DIAGNOSIS — Z789 Other specified health status: Secondary | ICD-10-CM

## 2021-01-03 DIAGNOSIS — I2510 Atherosclerotic heart disease of native coronary artery without angina pectoris: Secondary | ICD-10-CM | POA: Diagnosis not present

## 2021-01-03 DIAGNOSIS — I130 Hypertensive heart and chronic kidney disease with heart failure and stage 1 through stage 4 chronic kidney disease, or unspecified chronic kidney disease: Secondary | ICD-10-CM | POA: Diagnosis not present

## 2021-01-03 DIAGNOSIS — N183 Chronic kidney disease, stage 3 unspecified: Secondary | ICD-10-CM | POA: Insufficient documentation

## 2021-01-03 LAB — RESP PANEL BY RT-PCR (FLU A&B, COVID) ARPGX2
Influenza A by PCR: NEGATIVE
Influenza B by PCR: NEGATIVE
SARS Coronavirus 2 by RT PCR: NEGATIVE

## 2021-01-03 LAB — CBC WITH DIFFERENTIAL/PLATELET
Abs Immature Granulocytes: 0.03 10*3/uL (ref 0.00–0.07)
Basophils Absolute: 0 10*3/uL (ref 0.0–0.1)
Basophils Relative: 1 %
Eosinophils Absolute: 0.1 10*3/uL (ref 0.0–0.5)
Eosinophils Relative: 1 %
HCT: 42 % (ref 39.0–52.0)
Hemoglobin: 14 g/dL (ref 13.0–17.0)
Immature Granulocytes: 0 %
Lymphocytes Relative: 22 %
Lymphs Abs: 1.5 10*3/uL (ref 0.7–4.0)
MCH: 30.6 pg (ref 26.0–34.0)
MCHC: 33.3 g/dL (ref 30.0–36.0)
MCV: 91.7 fL (ref 80.0–100.0)
Monocytes Absolute: 0.7 10*3/uL (ref 0.1–1.0)
Monocytes Relative: 11 %
Neutro Abs: 4.4 10*3/uL (ref 1.7–7.7)
Neutrophils Relative %: 65 %
Platelets: 145 10*3/uL — ABNORMAL LOW (ref 150–400)
RBC: 4.58 MIL/uL (ref 4.22–5.81)
RDW: 15 % (ref 11.5–15.5)
WBC: 6.7 10*3/uL (ref 4.0–10.5)
nRBC: 0 % (ref 0.0–0.2)

## 2021-01-03 LAB — COMPREHENSIVE METABOLIC PANEL
ALT: 25 U/L (ref 0–44)
AST: 29 U/L (ref 15–41)
Albumin: 3.5 g/dL (ref 3.5–5.0)
Alkaline Phosphatase: 60 U/L (ref 38–126)
Anion gap: 9 (ref 5–15)
BUN: 10 mg/dL (ref 8–23)
CO2: 29 mmol/L (ref 22–32)
Calcium: 8.7 mg/dL — ABNORMAL LOW (ref 8.9–10.3)
Chloride: 103 mmol/L (ref 98–111)
Creatinine, Ser: 1.57 mg/dL — ABNORMAL HIGH (ref 0.61–1.24)
GFR, Estimated: 48 mL/min — ABNORMAL LOW (ref >60)
Glucose, Bld: 140 mg/dL — ABNORMAL HIGH (ref 70–99)
Potassium: 3.2 mmol/L — ABNORMAL LOW (ref 3.5–5.1)
Sodium: 141 mmol/L (ref 135–145)
Total Bilirubin: 0.7 mg/dL (ref 0.3–1.2)
Total Protein: 6.5 g/dL (ref 6.5–8.1)

## 2021-01-03 LAB — CBG MONITORING, ED: Glucose-Capillary: 104 mg/dL — ABNORMAL HIGH (ref 70–99)

## 2021-01-03 MED ORDER — SODIUM CHLORIDE 0.9 % IV SOLN: INTRAVENOUS | Status: DC

## 2021-01-03 MED ORDER — IOHEXOL 350 MG/ML SOLN
75.0000 mL | Freq: Once | INTRAVENOUS | Status: AC | PRN
  Administered 2021-01-03: 16:00:00 75 mL via INTRAVENOUS

## 2021-01-03 NOTE — Consult Note (Addendum)
Cardiology Consultation:   Patient ID: Max AprilJames D Bureau MRN: [id number]; DOB: June 25, 1950  Admit date: 01/03/2021 Date of Consult: 01/03/2021  PCP:  Anson FretJones, Christopher, MD   [facility] Providers Cardiologist:  Lesleigh NoeHenry W Smith III, MD  Electrophysiologist:  Lewayne BuntingGregg Kaylenn Civil, MD    Follow[facility] and [facility]  Patient Profile:   Max Rivas is a 70 y.o. male with a hx of COPD (Gold 4), CAD (CABG 2021 and LAA clip), ICM, AFib, VT who is being seen 01/03/2021 for the evaluation of reports of bradycardia at the request of Dr. Jeraldine LootsLockwood.  Device information Arrhythmia/AAD hx  MDT single chamber implanted/new RV lead and device 03/19/2011 >> gen change 09/29/2019 Initial implant 2005 (RV lead was 6949 lead, is abandoned)   + hx of appropriate therapies AAD amiodarone (on historically) and restarted March 2021 Mexiletine   He underwent EPS/ablation 09/26/19 seems a PVC/VT ablation, his record notes: A comprehensive EP study was performed and patient underwent successful radiofrequency ablation of his arrhythmia, with successful cardioversion Complicated by R groin hematoma requiring surgical repair 09/27/19 09/29/19 had gen change Discharge 10/03/19 Discharge Diagnoses:  ICD fires for VT s/p PVC/VT ablation Right femoral artery pseudoaneurysm ICD ERI s/p generator change   [facility] was march 2022 for VT, was re-loaded on amiodarone, mexiletine continued, at that time, via his out patient cardiology team, was [facility] repeat ablation  History of Present Illness:   Mr. Malen GauzeFoster last saw EP, Dr. Deno LungerHranitzky may 2022, underwent EPS Ablation on 06/03/20. He described his VT as arising from the septal base of the posteromedial papillary muscle, consolidation of the inferior scar, and the apical aspect of the posteromedial papillary muscle He was discharged on amiodarone 200mg  daily His mexiletine STOPPED  [facility] with reports of intermittent L sided numbness/weakness and possible syncope, EMS apparently reported in route observing transient HRs 20's without pacing support and EP is asked to see in regards to this.  LABS K+ 3.2  BUN/Creat 10/1.57 WBC 6.7 H/H 14/42 Plts 145  He tells me that he has been doing really well actually, since his last ablation he has only been once aware of his heart beating a little fast/suspected a couple hours of Afib a few weeks ago.  He has not had any CP, does not feel like he is retaining fluid or SOB. He says he was at home alone, suddenly felt his whole left side go numb, not necessarily weak specifically but "totally numb" Lasted about 15minutes and was home alone, worried about it and called 911.  By the time he decided to call the symptom was getting better and by the time they arrived practically gone, and currently remains completely resolved  He did not feel like he was fainting but did feel like he was generally weak, perhaps lightheaded. He denies any particular symptoms in route   Past Medical History:  Diagnosis Date   Arthritis    "elbows, knees" (02/24/2016)   Bronchitis 2006   CAD (coronary artery disease)    a. s/p prior MIs - 1995 x 2, 1998; b. s/p prior LCX stenting; c. 04/2019 Cath: LM min irregs, LAD 65p/mi, D1 75, LCX 99ost/p, 6536m (ISR), OM2 80, RCA/RPDA mod diff dzs; d. 05/2019 CABG x 3: LIMA->LAD, VG->Diag, VG->OM.   COPD (chronic obstructive pulmonary disease) (HCC)    a. Remote tobacco-->on home O2.   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 05/2019 Echo: EF 40-45%, gr2 DD. Nl RV size/fxn. Mildly dil LA. Mild MR.   High cholesterol    Ischemic cardiomyopathy    a. 05/2019 Echo: EF 40-45%.   Morbid obesity (HCC)    Myocardial infarction (HCC) ~ 1995 X 2;1998; 2000; 2004   On home oxygen therapy    "2L w/activity" (02/24/2016)   PAF (paroxysmal atrial  fibrillation) (HCC)    a. CHA2DS2VASc = 4-->eliquis. Also on amio.   Pulmonary embolism (HCC) 02/24/2016   Sleep apnea    "have CPAP; can't tolerate it" (02/24/2016)   Ventricular tachycardia    a. 2005 s/p ICD; b. 03/2011 Device upgrade/lead exchange: MDT Protecta XT VR single lead AICD.    Past Surgical History:  Procedure Laterality Date   CARDIOVERSION N/A 09/11/2019   Procedure: CARDIOVERSION;  Surgeon: Jodelle Red, MD;  Location: [facility] ENDOSCOPY;  Service: Cardiovascular;  Laterality: N/A;   CLIPPING OF ATRIAL APPENDAGE N/A 05/23/2019   Procedure: CLIPPING OF ATRIAL APPENDAGE with atriclip;  Surgeon: [name], MD;  Location: [facility] OR;  Service: Open Heart Surgery;  Laterality: N/A;   CORONARY ANGIOPLASTY  1995   CORONARY ANGIOPLASTY WITH STENT PLACEMENT  ~ 1995 - 2004 X 5   "I've got a total of 5 stents" (02/24/2016)   CORONARY ARTERY BYPASS GRAFT N/A 05/23/2019   Procedure: CORONARY ARTERY BYPASS GRAFTING (CABG), times three, using right greater saphenous vein and left internal mammary;  Surgeon: [name], MD;  Location: [facility] OR;  Service: Open Heart Surgery;  Laterality: N/A;  [place] only   INSERT / REPLACE / REMOVE PACEMAKER  07/2003   original PPM around 2004 for ICM with EF < 35%   INTRAVASCULAR PRESSURE WIRE/FFR STUDY N/A 05/12/2019   Procedure: INTRAVASCULAR PRESSURE WIRE/FFR STUDY;  Surgeon: Lyn Records, MD;  Location: [facility] INVASIVE CV LAB;  Service: Cardiovascular;  Laterality: N/A;   LEFT HEART CATH AND CORONARY ANGIOGRAPHY N/A 05/12/2019   Procedure: LEFT HEART CATH AND CORONARY ANGIOGRAPHY;  Surgeon: Lyn Records, MD;  Location: [facility] INVASIVE CV LAB;  Service: Cardiovascular;  Laterality: N/A;   PACEMAKER GENERATOR CHANGE  03/2011    [facility] [place]   TEE WITHOUT CARDIOVERSION N/A 05/23/2019   Procedure: TRANSESOPHAGEAL ECHOCARDIOGRAM (TEE);  Surgeon: [name], MD;  Location: [facility] OR;  Service: Open Heart Surgery;  Laterality: N/A;   TONSILLECTOMY       Home  Medications:  Prior to Admission medications   Medication Sig Start Date End Date Taking? Authorizing Provider  acetaminophen (TYLENOL) 325 MG tablet Take 2 tablets (650 mg total) by mouth every 6 (six) hours as needed for mild pain (or Fever >/= 101). Patient not taking: No sig reported 03/05/16   Alison Murray, MD  acetaminophen (TYLENOL) 500 MG tablet Take 500-1,000 mg by mouth every 6 (six) hours as needed for moderate pain, headache or mild pain.    [provider]  amiodarone (PACERONE) 400 MG tablet Take 1 tablet (400 mg total) by mouth 2 (two) times daily. 03/19/20   Graciella Freer, PA-C  apixaban (ELIQUIS) 5 MG TABS tablet Take 5 mg by mouth 2 (two) times daily.    [provider]  aspirin 81 MG chewable tablet Chew 1 tablet (81 mg total) by mouth daily. 05/16/19   Kroeger, Ovidio Kin., PA-C  atorvastatin (LIPITOR) 80 MG tablet Take 1 tablet (80 mg total) by mouth daily. Patient not taking: No  sig reported 05/15/19   Kroeger, Ovidio Kin., PA-C  Cholecalciferol (VITAMIN D3) 50 MCG (2000 UT) TABS Take 2,000 Units by mouth daily.    [provider]  furosemide (LASIX) 40 MG tablet Take 40 mg by mouth 2 (two) times daily.     [provider]  gabapentin (NEURONTIN) 100 MG capsule Take 2 capsules (200 mg total) by mouth 2 (two) times daily. 06/01/19   Barrett, Erin R, PA-C  levalbuterol (XOPENEX HFA) 45 MCG/ACT inhaler Inhale 2 puffs into the lungs every 6 (six) hours as needed for wheezing or shortness of breath.    [provider]  levothyroxine (SYNTHROID) 75 MCG tablet Take 75 mcg by mouth daily before breakfast.    [provider]  metoprolol succinate (TOPROL-XL) 25 MG 24 hr tablet Take 12.5 mg by mouth in the morning.    [provider]  mexiletine (MEXITIL) 150 MG capsule Take 2 capsules (300 mg total) by mouth 2 (two) times daily. Patient taking differently: Take 150 mg by mouth 2 (two) times daily. 12/07/19   Filbert Schilder, NP  mometasone [facility]) 220 MCG/INH inhaler Inhale 2 puffs into the lungs 2 (two) times daily.    [provider]  montelukast (SINGULAIR) 10 MG tablet Take 10 mg by mouth at bedtime.    [provider]  Multiple Vitamin (MULTIVITAMIN WITH MINERALS) TABS tablet Take 1 tablet by mouth daily with breakfast.    [provider]  nitroGLYCERIN (NITROSTAT) 0.4 MG SL tablet Place 0.4 mg under the tongue every 5 (five) minutes as needed for chest pain.     [provider]  omega-3 acid ethyl esters (LOVAZA) 1 g capsule Take 1 capsule (1 g total) by mouth 2 (two) times daily. Patient not taking: Reported on 03/18/2020 03/05/16   Alison Murray, MD  Omega-3 Fatty Acids (FISH OIL) 1000 MG CAPS Take 1,000 mg by mouth in the morning and at bedtime.    [provider]  omeprazole (PRILOSEC) 20 MG capsule Take 20 mg by mouth in the morning and at bedtime.    [provider]  potassium chloride SA (KLOR-CON) 20 MEQ tablet Take 1 tablet (20 mEq total) by mouth 2 (two) times daily. 03/19/20   Graciella Freer, PA-C  Propylene Glycol (SYSTANE BALANCE) 0.6 % SOLN Place 1 drop into both eyes See admin instructions. Place 1 drop into each eye four times a day and apply a warm compress for 10 minutes afterwards     [provider]  rosuvastatin (CRESTOR) 40 MG tablet Take 20 mg by mouth at bedtime.    [provider]  Tiotropium Bromide-Olodaterol 2.5-2.5 MCG/ACT AERS Inhale 1 puff into the lungs in the morning and at bedtime.    [provider]    Inpatient Medications: Scheduled Meds:  Continuous Infusions:  sodium chloride     PRN Meds:   Allergies:    Allergies  Allergen Reactions   Crestor [Rosuvastatin Calcium] Other (See Comments)    Back spasms, initially, but can tolerate it at a low dose now, in 2022   Rosuvastatin Other (See Comments)    Back spasms, initially, but can tolerate it at a low dose now, in 2022    Albuterol Other (See Comments)    IRREGULAR HEART RATE   Atorvastatin Other (See Comments)    Spasticity    Social History:   Social History   Socioeconomic History   Marital status: Divorced    Spouse name:  Not on file   Number of children: Not on file   Years of education: Not on file   Highest education level: Not on file  Occupational History   Not on file  Tobacco Use   Smoking status: Former    Packs/day: 3.00    Years: 35.00    Pack years: 105.00    Types: Cigarettes    Quit date: 05/22/2019    Years since quitting: 1.6   Smokeless tobacco: Former    Quit date: 05/21/2017  Vaping Use   Vaping Use: Never used  Substance and Sexual Activity   Alcohol use: Not Currently   Drug use: Not Currently    Types: Cocaine    Comment: none since 1995   Sexual activity: Yes  Other Topics Concern   Not on file  Social History Narrative   Not on file   Social Determinants of Health   Financial Resource Strain: Not on file  Food Insecurity: Not on file  Transportation Needs: Not on file  Physical Activity: Not on file  Stress: Not on file  Social Connections: Not on file  Intimate Partner Violence: Not on file    Family History:   Family History  Problem Relation Age of Onset   Heart attack Mother    Heart attack Father    Heart attack Sister 71     ROS:  Please see the history of present illness.  All other ROS reviewed and negative.     Physical Exam/Data:   Vitals:   01/03/21 1145 01/03/21 1200 01/03/21 1215 01/03/21 1245  BP: 113/61 116/66 111/64 109/68  Pulse: (!) 54 (!) 56 (!) 55 62  Resp: (!) 31 (!) 21 20 18   Temp:      TempSrc:      SpO2: 98% 100% 100% 100%  Weight:      Height:       No intake or output data in the 24 hours ending 01/03/21 1303 Last 3 Weights 01/03/2021 05/04/2020 05/03/2020  Weight (lbs) 217 lb 202 lb 13.2 oz 203 lb  Weight (kg) 98.431 kg 92 kg 92.08 kg     Body mass index is 30.27 kg/m.  General:  Well nourished, well  developed, in no acute distress HEENT: normal Neck: no JVD Vascular: No carotid bruits; Distal pulses 2+ bilaterally Cardiac:  RRR; soft SM, no gallops or rubs Lungs:  CTA b/l, no wheezing, rhonchi or rales  Abd: soft, nontender  Ext: no edema Musculoskeletal:  No deformities Skin: warm and dry  Neuro:  no gross focal abnormalities noted Psych:  Normal affect   EKG:  The EKG was personally reviewed and demonstrates:    SB 57bpm, , baseline interference  Telemetry:  Telemetry was personally reviewed and demonstrates:   SB 50's, infrequent/occ PVCs, rare couplet No heart block, no rates <50 noted, no VT  Relevant CV Studies:  03/19/20: TTE IMPRESSIONS   1. Left ventricular ejection fraction, by estimation, is 30 to 35%. The  left ventricle has moderately decreased function. The left ventricle  demonstrates global hypokinesis. Left ventricular diastolic parameters are  consistent with Grade II diastolic  dysfunction (pseudonormalization).   2. Right ventricular systolic function is moderately reduced. The right  ventricular size is normal.   3. Left atrial size was mildly dilated.   4. The mitral valve is normal in structure. Mild mitral valve  regurgitation.   5. The aortic valve is tricuspid. Aortic valve regurgitation is not  visualized. No aortic stenosis is  present.    05/12/19: LHC Recurrent, probably ischemically mediated ventricular tachycardia with multiple shocks. High-grade native and diffuse in-stent restenosis ostial to mid circumflex 95 to 99%.  There is also a 90% stenosis beyond the stented segment. Widely patent left main Eccentric bulky and calcified 50 to 65% proximal to mid LAD beyond the origin of the large first diagonal.  Eccentric bulky and calcified ostial to proximal 70% first diagonal.  Both LAD and diagonal are mildly hemodynamically significant based upon RFR values 0.89 and 0.87, respectively.  RFR 0.89 or less is hemodynamically significant. The  right coronary particularly in the mid segment contains diffuse atherosclerosis but no focal high-grade obstruction. Left ventriculography is not performed because of renal insufficiency.  LVEDP is normal measuring 10 mmHg.   Laboratory Data:  High Sensitivity Troponin:  No results for input(s): TROPONINIHS in the last 720 hours.   Chemistry Recent Labs  Lab 01/03/21 1048  NA 141  K 3.2*  CL 103  CO2 29  GLUCOSE 140*  BUN 10  CREATININE 1.57*  CALCIUM 8.7*  GFRNONAA 48*  ANIONGAP 9    Recent Labs  Lab 01/03/21 1048  PROT 6.5  ALBUMIN 3.5  AST 29  ALT 25  ALKPHOS 60  BILITOT 0.7   Lipids No results for input(s): CHOL, TRIG, HDL, LABVLDL, LDLCALC, CHOLHDL in the last 168 hours.  Hematology Recent Labs  Lab 01/03/21 1048  WBC 6.7  RBC 4.58  HGB 14.0  HCT 42.0  MCV 91.7  MCH 30.6  MCHC 33.3  RDW 15.0  PLT 145*   Thyroid No results for input(s): TSH, FREET4 in the last 168 hours.  BNPNo results for input(s): BNP, PROBNP in the last 168 hours.  DDimer No results for input(s): DDIMER in the last 168 hours.   Radiology/Studies:  CT Head Wo Contrast Result Date: 01/03/2021 CLINICAL DATA:  Mental status change, unknown cause L sided numbness, near syncope on eliquis EXAM: CT HEAD WITHOUT CONTRAST TECHNIQUE: Contiguous axial images were obtained from the base of the skull through the vertex without intravenous contrast. COMPARISON:  November 2021 FINDINGS: Brain: There is no acute intracranial hemorrhage, mass effect, or edema. No new loss of gray-white differentiation. Small chronic right parietal infarct. Additional minimal patchy hypoattenuation in the supratentorial white matter is nonspecific but may reflect minor chronic microvascular ischemic changes. There is no extra-axial fluid collection. Ventricles and sulci are within normal limits in size and configuration. Vascular: There is atherosclerotic calcification at the skull base. Skull: Calvarium is unremarkable.  Sinuses/Orbits: No acute finding. Other: None. IMPRESSION: No acute intracranial abnormality. Electronically Signed   By: Macy Mis M.D.   On: 01/03/2021 12:39   DG Chest Port 1 View Result Date: 01/03/2021 CLINICAL DATA:  Chest pain EXAM: PORTABLE CHEST 1 VIEW COMPARISON:  05/03/2020 FINDINGS: Chronic interstitial changes and scarring. Emphysematous changes. No new consolidation or edema. No pleural effusion or pneumothorax. Similar cardiomediastinal contours. Left chest wall ICD. IMPRESSION: No acute process in the chest. Electronically Signed   By: Macy Mis M.D.   On: 01/03/2021 10:30     Assessment and Plan:   L sided numbness Is resolved HE CAN NOT HAVE MRI  with a known abandoned lead  He has hx of Afib though none of late by his device He reports compliance with his Eliquis (and all of his medicines) He confirms he was told to stop and is not taking the mexiletine  He has a long hx of VT, though none by  his device today or of late He has had some very brief NSVT (1 second), and none today   2. Reports of transient HRs to 20s by EMS I have reviewed their record and tracings, none that noted bradycardia Device check today with stable lead measurements, nothing to suggest he would have had lack of pacing (set at VVI 40) HR histograms do not note any rates <40    Hx of CAD/CABG, ICM, chronic CHF He does not appear or feel volume OL, OptiVol looks OK, is below threshold I do not think there is any further needed from EP/device perspective Replace mild hypokalemia He has not had any CP, EKG does not looks ischemic  Dr. Lovena Le will see the patient    Risk Assessment/Risk Scores:    For questions or updates, please contact [facility] Please consult [email or web address] for contact info under    Signed, Baldwin Jamaica, PA-C  01/03/2021 1:03 PM  EP Attending  Patient seen and examined. Agree with the findings as noted above. The patient has developed left  sided numbness and presented for evaluation. He has no documented arrhythmias and denies syncope or near syncope. He thinks that his heart rhythm has improved. He had a second ablation several weeks ago and no recurrent VT. His atrial fib has been controlled. His neuro symptoms have resolved. I do not think he needs any additional EP evaluation. Continue current medical therapy.  Carleene Overlie Didier Brandenburg,MD

## 2021-01-03 NOTE — ED Notes (Signed)
Patient transported to CT 

## 2021-01-03 NOTE — ED Triage Notes (Signed)
Pt BIB [facility] from home c/o left sided weakness and tingling with a near syncopal episode. Per EMS enroute pt was symptomatically brady. Pt does have a medtronic ICD.    109/40 197

## 2021-01-03 NOTE — ED Notes (Signed)
Pt back from CT

## 2021-01-03 NOTE — Discharge Instructions (Signed)
You may follow-up with your family doctor within the next 7 days.  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  When you see your doctor this week please have them obtain your medical record so that you can discuss these results with them and create a plan for follow-up.  If you should develop severe or worsening symptoms including increasing numbness, weakness, difficulty with speech or vision or slurred speech or facial droop or confusion or any other concerning symptoms such as chest pain shortness of breath or nausea or vomiting, return to the emergency department immediately.

## 2021-01-03 NOTE — ED Provider Notes (Signed)
[name] EMERGENCY DEPARTMENT Provider Note   CSN: [id number] Arrival date & time: 01/03/21  1011     History No chief complaint on file.   Max Rivas is a 70 y.o. male.  HPI Patient presents via EMS from home with concern for near syncope, left-sided numbness.  Patient has multiple medical issues including ischemic cardiomyopathy, COPD, prior stroke, and baseline oxygen dependency.  History is obtained by the patient and EMS providers.  He notes that he was in his usual state of health until today, in the past few hours without obvious precipitant he has developed intermittent left-sided numbness in his upper extremity.  No speech difficulty, no fall, no complete lack of sensation, but there is some discoordination in his left arm. No chest pain, no new dyspnea.  Patient is on oxygen, as above, this requirement has not changed in route.  However, in transport the patient was found to have a variable heart rate, from the 80s down to the 20s, without obvious pacemaker activity.  Cardiology note from 6 months ago reviewed, pertinent details below: "RANNY WIEBELHAUS is a 70 y.o. 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 ICM with EF 40-45%. History of PAF on eliquis with recent [facility] 8/31 for afib.  History of single lead AICD and recurrent VT RX with oral amiodarone and mexiletine."    Past Medical History:  Diagnosis Date   Arthritis    "elbows, knees" (02/24/2016)   Bronchitis 2006   CAD (coronary artery disease)    a. s/p prior MIs - 1995 x 2, 1998; b. s/p prior LCX stenting; c. 04/2019 Cath: LM min irregs, LAD 65p/mi, D1 75, LCX 99ost/p, 8m (ISR), OM2 80, RCA/RPDA mod diff dzs; d. 05/2019 CABG x 3: LIMA->LAD, VG->Diag, VG->OM.   COPD (chronic obstructive pulmonary disease) (HCC)    a. Remote tobacco-->on home O2.   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 05/2019 Echo: EF 40-45%, gr2 DD. Nl RV size/fxn. Mildly dil LA. Mild MR.   High cholesterol    Ischemic cardiomyopathy    a. 05/2019 Echo: EF 40-45%.   Morbid obesity (HCC)    Myocardial infarction (HCC) ~ 1995 X 2;1998; 2000; 2004   On home oxygen therapy    "2L w/activity" (02/24/2016)   PAF (paroxysmal atrial fibrillation) (HCC)    a. CHA2DS2VASc = 4-->eliquis. Also on amio.   Pulmonary embolism (HCC) 02/24/2016   Sleep apnea    "have CPAP; can't tolerate it" (02/24/2016)   Ventricular tachycardia    a. 2005 s/p ICD; b. 03/2011 Device upgrade/lead exchange: MDT Protecta XT VR single lead AICD.    Patient Active Problem List   Diagnosis Date Noted   Ventricular tachyarrhythmia 12/07/2019   Cardiomyopathy, ischemic    Vomiting    Acute respiratory failure with hypoxia (HCC) 09/18/2019   Acute on chronic heart failure (HCC) 09/08/2019   Unstable angina (HCC) 05/21/2019   Ventricular tachycardia 05/12/2019   Chest pain    VT (ventricular tachycardia) 05/11/2019   Arrhythmia 03/12/2019   On home O2    Chronic HFrEF (heart failure with reduced ejection fraction) (HCC) 06/22/2018   Chronic respiratory failure with hypoxia (HCC) 06/22/2018   Congestive heart failure (CHF) (HCC) 06/22/2018   DCM (dilated cardiomyopathy) (HCC) 05/25/2017   Coronary artery disease  05/25/2017   Chronic atrial fibrillation 05/25/2017   COPD (chronic obstructive pulmonary disease) (HCC) 05/25/2017   CKD (chronic kidney disease) 05/25/2017   Hypothyroidism 05/25/2017   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/2017   OSA (obstructive sleep apnea) 05/25/2017   Pulmonary HTN (HCC) 05/25/2017   Mediastinal adenopathy    Cervical adenopathy    Acute respiratory failure with hypoxia and hypercarbia (HCC) 02/24/2016   Pulmonary embolus (HCC) 02/24/2016   Pulmonary artery thrombosis (HCC)    Pleural effusion    Lung mass    Pleural plaque     Flutter-fibrillation 01/15/2016   Acute systolic congestive heart failure (HCC)    Atrial fibrillation with RVR (HCC)    Hypercholesterolemia 03/11/2006   Tobacco abuse 03/11/2006   Acute on chronic systolic congestive heart failure (HCC) 03/11/2006   GASTROESOPHAGEAL REFLUX, NO ESOPHAGITIS 03/11/2006   OSTEOARTHRITIS, MULTI SITES 03/11/2006   HIGH RISK PATIENT 03/11/2006   Tobacco dependence 03/11/2006    Past Surgical History:  Procedure Laterality Date   CARDIOVERSION N/A 09/11/2019   Procedure: CARDIOVERSION;  Surgeon: Jodelle Red, MD;  Location: [facility] ENDOSCOPY;  Service: Cardiovascular;  Laterality: N/A;   CLIPPING OF ATRIAL APPENDAGE N/A 05/23/2019   Procedure: CLIPPING OF ATRIAL APPENDAGE with atriclip;  Surgeon: [name], MD;  Location: [facility] OR;  Service: Open Heart Surgery;  Laterality: N/A;   CORONARY ANGIOPLASTY  1995   CORONARY ANGIOPLASTY WITH STENT PLACEMENT  ~ 1995 - 2004 X 5   "I've got a total of 5 stents" (02/24/2016)   CORONARY ARTERY BYPASS GRAFT N/A 05/23/2019   Procedure: CORONARY ARTERY BYPASS GRAFTING (CABG), times three, using right greater saphenous vein and left internal mammary;  Surgeon: [name], MD;  Location: [facility] OR;  Service: Open Heart Surgery;  Laterality: N/A;  [place] only   INSERT / REPLACE / REMOVE PACEMAKER  07/2003   original PPM around 2004 for ICM with EF < 35%   INTRAVASCULAR PRESSURE WIRE/FFR STUDY N/A 05/12/2019   Procedure: INTRAVASCULAR PRESSURE WIRE/FFR STUDY;  Surgeon: Lyn Records, MD;  Location: [facility] INVASIVE CV LAB;  Service: Cardiovascular;  Laterality: N/A;   LEFT HEART CATH AND CORONARY ANGIOGRAPHY N/A 05/12/2019   Procedure: LEFT HEART CATH AND CORONARY ANGIOGRAPHY;  Surgeon: Lyn Records, MD;  Location: [facility] INVASIVE CV LAB;  Service: Cardiovascular;  Laterality: N/A;   PACEMAKER GENERATOR CHANGE  03/2011    [facility]   TEE WITHOUT CARDIOVERSION N/A 05/23/2019   Procedure: TRANSESOPHAGEAL ECHOCARDIOGRAM (TEE);   Surgeon: [name], MD;  Location: [facility] OR;  Service: Open Heart Surgery;  Laterality: N/A;   TONSILLECTOMY         Family History  Problem Relation Age of Onset   Heart attack Mother    Heart attack Father    Heart attack Sister 50    Social History   Tobacco Use   Smoking status: Former    Packs/day: 3.00    Years: 35.00    Pack years: 105.00    Types: Cigarettes    Quit date: 05/22/2019    Years since quitting: 1.6   Smokeless tobacco: Former    Quit date: 05/21/2017  Vaping Use   Vaping Use: Never used  Substance Use Topics   Alcohol use: Not Currently   Drug use: Not Currently    Types: Cocaine    Comment: none since 1995    Home Medications Prior to Admission medications   Medication Sig Start Date End Date Taking? Authorizing Provider  acetaminophen (TYLENOL) 325 MG tablet Take 2 tablets (650 mg total) by mouth every 6 (six) hours as needed for mild pain (or Fever >/= 101). Patient not taking: No sig reported 03/05/16   Alison Murray, MD  acetaminophen (TYLENOL) 500 MG tablet Take 500-1,000 mg by mouth every 6 (six) hours as needed for moderate pain, headache or mild pain.    [provider]  amiodarone (PACERONE) 400 MG tablet Take 1 tablet (400 mg total) by mouth 2 (two) times daily. 03/19/20   Graciella Freer, PA-C  apixaban (ELIQUIS) 5 MG TABS tablet Take 5 mg by mouth 2 (two) times daily.    [provider]  aspirin 81 MG chewable tablet Chew 1 tablet (81 mg total) by mouth daily. 05/16/19   Kroeger, Ovidio Kin., PA-C  atorvastatin (LIPITOR) 80 MG tablet Take 1 tablet (80 mg total) by mouth daily. Patient not taking: No sig reported 05/15/19   Beatriz Stallion., PA-C  Cholecalciferol (VITAMIN D3) 50 MCG (2000 UT) TABS Take 2,000 Units by mouth daily.    [provider]  furosemide (LASIX) 40 MG tablet Take 40 mg by mouth 2 (two) times daily.     [provider]  gabapentin (NEURONTIN) 100 MG capsule Take 2 capsules (200  mg total) by mouth 2 (two) times daily. 06/01/19   Barrett, Erin R, PA-C  levalbuterol (XOPENEX HFA) 45 MCG/ACT inhaler Inhale 2 puffs into the lungs every 6 (six) hours as needed for wheezing or shortness of breath.    [provider]  levothyroxine (SYNTHROID) 75 MCG tablet Take 75 mcg by mouth daily before breakfast.    [provider]  metoprolol succinate (TOPROL-XL) 25 MG 24 hr tablet Take 12.5 mg by mouth in the morning.    [provider]  mexiletine (MEXITIL) 150 MG capsule Take 2 capsules (300 mg total) by mouth 2 (two) times daily. Patient taking differently: Take 150 mg by mouth 2 (two) times daily. 12/07/19   Filbert Schilder, NP  mometasone [facility]) 220 MCG/INH inhaler Inhale 2 puffs into the lungs 2 (two) times daily.    [provider]  montelukast (SINGULAIR) 10 MG tablet Take 10 mg by mouth at bedtime.    [provider]  Multiple Vitamin (MULTIVITAMIN WITH MINERALS) TABS tablet Take 1 tablet by mouth daily with breakfast.    [provider]  nitroGLYCERIN (NITROSTAT) 0.4 MG SL tablet Place 0.4 mg under the tongue every 5 (five) minutes as needed for chest pain.     [provider]  omega-3 acid ethyl esters (LOVAZA) 1 g capsule Take 1 capsule (1 g total) by mouth 2 (two) times daily. Patient not taking: Reported on 03/18/2020 03/05/16   Alison Murray, MD  Omega-3 Fatty Acids (FISH OIL) 1000 MG CAPS Take 1,000 mg by mouth in the morning and at bedtime.    [provider]  omeprazole (PRILOSEC) 20 MG capsule Take 20 mg by mouth in the morning and at bedtime.    [provider]  potassium chloride SA (KLOR-CON) 20 MEQ tablet Take 1 tablet (20 mEq total) by mouth 2 (two) times daily. 03/19/20   Graciella Freer, PA-C  Propylene Glycol (SYSTANE BALANCE) 0.6 % SOLN Place 1 drop into both eyes See admin instructions. Place 1 drop into each eye four times a day and apply a warm compress for 10 minutes  afterwards     [provider]  rosuvastatin (CRESTOR) 40 MG tablet Take 20  mg by mouth at bedtime.    [provider]  Tiotropium Bromide-Olodaterol 2.5-2.5 MCG/ACT AERS Inhale 1 puff into the lungs in the morning and at bedtime.    [provider]    Allergies    Crestor [rosuvastatin calcium], Rosuvastatin, Albuterol, and Atorvastatin  Review of Systems   Review of Systems  Constitutional:        Per HPI, otherwise negative  HENT:         Per HPI, otherwise negative  Respiratory:         Per HPI, otherwise negative  Cardiovascular:        Per HPI, otherwise negative  Gastrointestinal:  Negative for vomiting.  Endocrine:       Negative aside from HPI  Genitourinary:        Neg aside from HPI   Musculoskeletal:        Per HPI, otherwise negative  Skin: Negative.   Neurological:  Positive for light-headedness. Negative for syncope.   Physical Exam Updated Vital Signs BP (!) 114/58    Pulse (!) 54    Temp 97.9 F (36.6 C) (Oral)    Resp 17    Ht 5\' 11"  (1.803 m)    Wt 98.4 kg    SpO2 99%    BMI 30.27 kg/m   Physical Exam Vitals and nursing note reviewed.  Constitutional:      General: He is not in acute distress.    Appearance: He is well-developed. He is obese.  HENT:     Head: Normocephalic and atraumatic.  Eyes:     Conjunctiva/sclera: Conjunctivae normal.  Cardiovascular:     Rate and Rhythm: Normal rate and regular rhythm.  Pulmonary:     Effort: Pulmonary effort is normal. No respiratory distress.     Breath sounds: No stridor.  Abdominal:     General: There is no distension.     Comments: Protuberant, reducible, umbilical hernia  Skin:    General: Skin is warm and dry.  Neurological:     Mental Status: He is alert and oriented to person, place, and time.     Sensory: Sensation is intact.     Motor: No weakness, tremor, atrophy or abnormal muscle tone.     Coordination: Coordination normal.    ED Results / Procedures /  Treatments   Labs (all labs ordered are listed, but only abnormal results are displayed) Labs Reviewed  COMPREHENSIVE METABOLIC PANEL - Abnormal; Notable for the following components:      Result Value   Potassium 3.2 (*)    Glucose, Bld 140 (*)    Creatinine, Ser 1.57 (*)    Calcium 8.7 (*)    GFR, Estimated 48 (*)    All other components within normal limits  CBC WITH DIFFERENTIAL/PLATELET - Abnormal; Notable for the following components:   Platelets 145 (*)    All other components within normal limits  CBG MONITORING, ED - Abnormal; Notable for the following components:   Glucose-Capillary 104 (*)    All other components within normal limits  RESP PANEL BY RT-PCR (FLU A&B, COVID) [id number]    EKG EKG Interpretation  Date/Time:  Friday January 03 2021 10:48:25 EST Ventricular Rate:  57 PR Interval:  192 QRS Duration: 102 QT Interval:  494 QTC Calculation: 480 R Axis:   33 Text Interpretation: Sinus bradycardia Low voltage QRS Cannot rule out Inferior infarct , age undetermined Abnormal ECG Confirmed by 05-07-2006 [phone]) on 01/03/2021 11:27:25 AM  Radiology  CT Head Wo Contrast  Result Date: 01/03/2021 CLINICAL DATA:  Mental status change, unknown cause L sided numbness, near syncope on eliquis EXAM: CT HEAD WITHOUT CONTRAST TECHNIQUE: Contiguous axial images were obtained from the base of the skull through the vertex without intravenous contrast. COMPARISON:  November 2021 FINDINGS: Brain: There is no acute intracranial hemorrhage, mass effect, or edema. No new loss of gray-white differentiation. Small chronic right parietal infarct. Additional minimal patchy hypoattenuation in the supratentorial white matter is nonspecific but may reflect minor chronic microvascular ischemic changes. There is no extra-axial fluid collection. Ventricles and sulci are within normal limits in size and configuration. Vascular: There is atherosclerotic calcification at the skull base. Skull:  Calvarium is unremarkable. Sinuses/Orbits: No acute finding. Other: None. IMPRESSION: No acute intracranial abnormality. Electronically Signed   By: Guadlupe Spanish M.D.   On: 01/03/2021 12:39   DG Chest Port 1 View  Result Date: 01/03/2021 CLINICAL DATA:  Chest pain EXAM: PORTABLE CHEST 1 VIEW COMPARISON:  05/03/2020 FINDINGS: Chronic interstitial changes and scarring. Emphysematous changes. No new consolidation or edema. No pleural effusion or pneumothorax. Similar cardiomediastinal contours. Left chest wall ICD. IMPRESSION: No acute process in the chest. Electronically Signed   By: Guadlupe Spanish M.D.   On: 01/03/2021 10:30    Procedures Procedures   Medications Ordered in ED Medications  0.9 %  sodium chloride infusion ( Intravenous New Bag/Given 01/03/21 1320)    ED Course  I have reviewed the triage vital signs and the nursing notes.  Pertinent labs & imaging results that were available during my care of the patient were reviewed by me and considered in my medical decision making (see chart for details).  Patient placed on continuous cardiac monitoring, pulse oximetry and efforts made to interrogate pacemaker given concern for symptomatic bradycardia.  Initial cardiac rhythm on monitor sinus 80s normal Pulse ox 97% with nasal cannula supplementation abnormal  Update: I discussed patient's case with our cardiology team, pacemaker has been interrogated.  No episodes of V. tach, no appreciable bradycardia today, device is seemingly working appropriately.  Our cardiology team reiterates that the patient cannot have an MRI due to a previously noted loose lead.   3:51 PM Patient in no distress, awake, alert, symptoms have yet to recur.  Labs notable for mild dehydration.  I discussed this case with our neurology team.  Given the patient's inability to had MRI, patient will have CT angiography, head, neck.  These are unremarkable patient is appropriate for outpatient follow-up with ongoing  Xarelto, aspirin.  MDM Rules/Calculators/A&P Adult male with multiple medical issues including cardiomyopathy, history of V. tach, now with implanted cardiac defibrillator, pacemaker presents with lightheadedness as well as left-sided numbness.  Symptoms resolved prior to ED arrival Here for hours of monitoring he has no additional episodes of numbness, weakness, nor pain or syncope.  Pacemaker interrogated, case discussed with cardiology and neurology.  Pending CT angiography results, that show blockage, the patient is on optimal medical management for stroke prophylaxis, anticoagulation for cardiac device, may be appropriate for discharge with close outpatient follow-up.   Final Clinical Impression(s) / ED Diagnoses Final diagnoses:  Numbness     Gerhard Munch, MD 01/03/21 1557

## 2021-01-03 NOTE — ED Provider Notes (Signed)
This patient was inherited at change of shift, he is a 70 year old male with a known history, COPD, obesity  Ischemic cardiomyopathy with ejection fraction 40 to 45%.  He has been on anticoagulation medications including Eliquis, aspirin and statin as well.  He presents today with complaint of left-sided weakness for this patient has appointment with patient.  Possible completely resolved prior to arrival he has been symptom-free since that time.  The patient was additionally evaluated by Dr. Lequita Halt, in conjunction with neurology plan was constructed the patient is with no acute findings of a CT angiogram of his neck.  He does have some obstructions which are between 50 and 65% however this did not seem significant improvement in symptoms today.  Stable stable for discharge, I personally evaluated the patient who  has no significant changes in motor, sensation or speech and appears well.  Advanced imaging is reassuring, additionally he has no chest pain, at  this time he appears stable for discharge.  The patient was informed of his results and the need for close follow-up.  He is agreeable to the plan.  He is aware of the indications for return.   Eber Hong, MD 01/03/21 [phone]

## 2021-03-08 IMAGING — CR DG CHEST 2V
2 series · 2 of 2 positions shown · non-contrast
Comparison: 01/13/2020

CLINICAL DATA: Chest pain and shortness of breath. High blood
pressure.

EXAM:
CHEST - 2 VIEW

[chest pa]
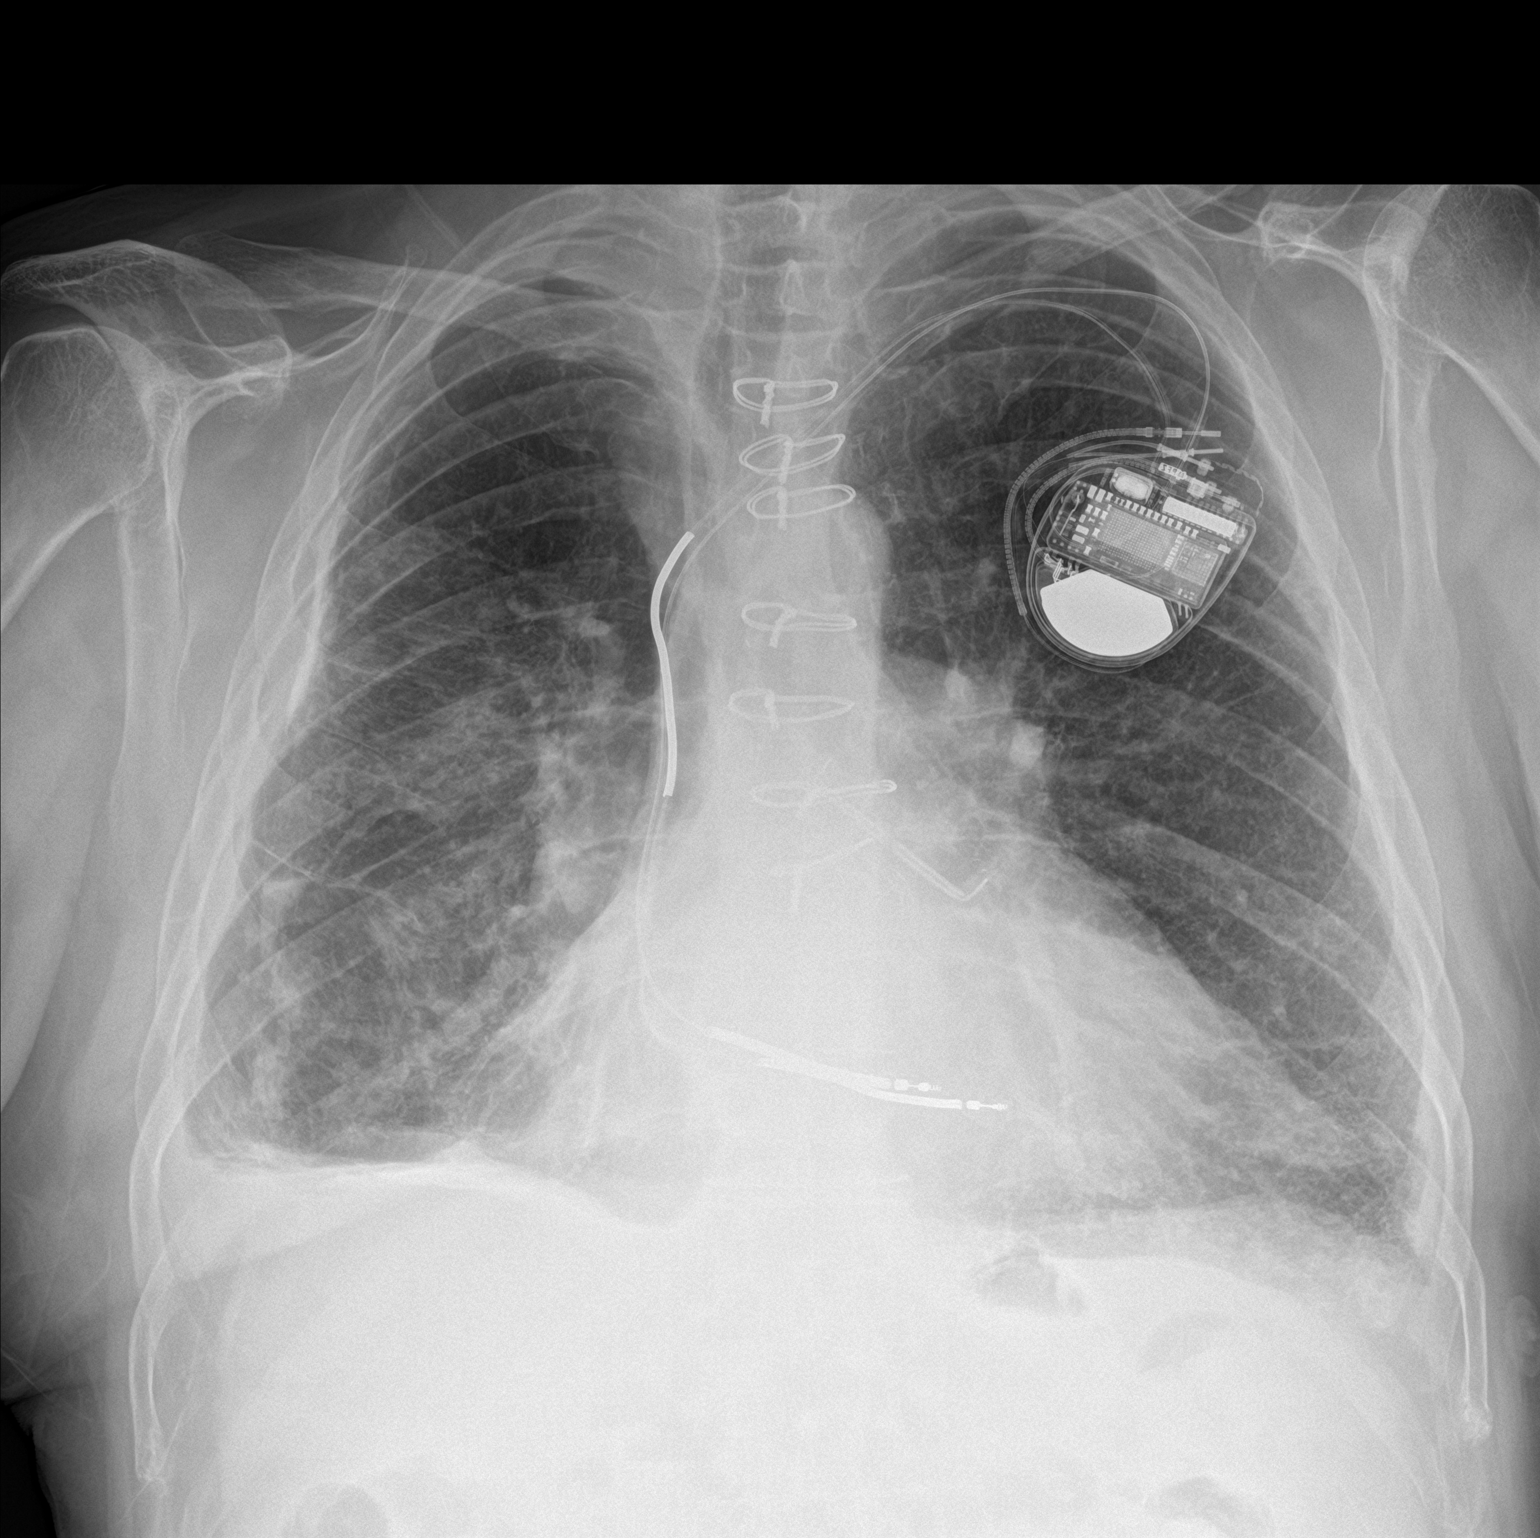

[chest lat]
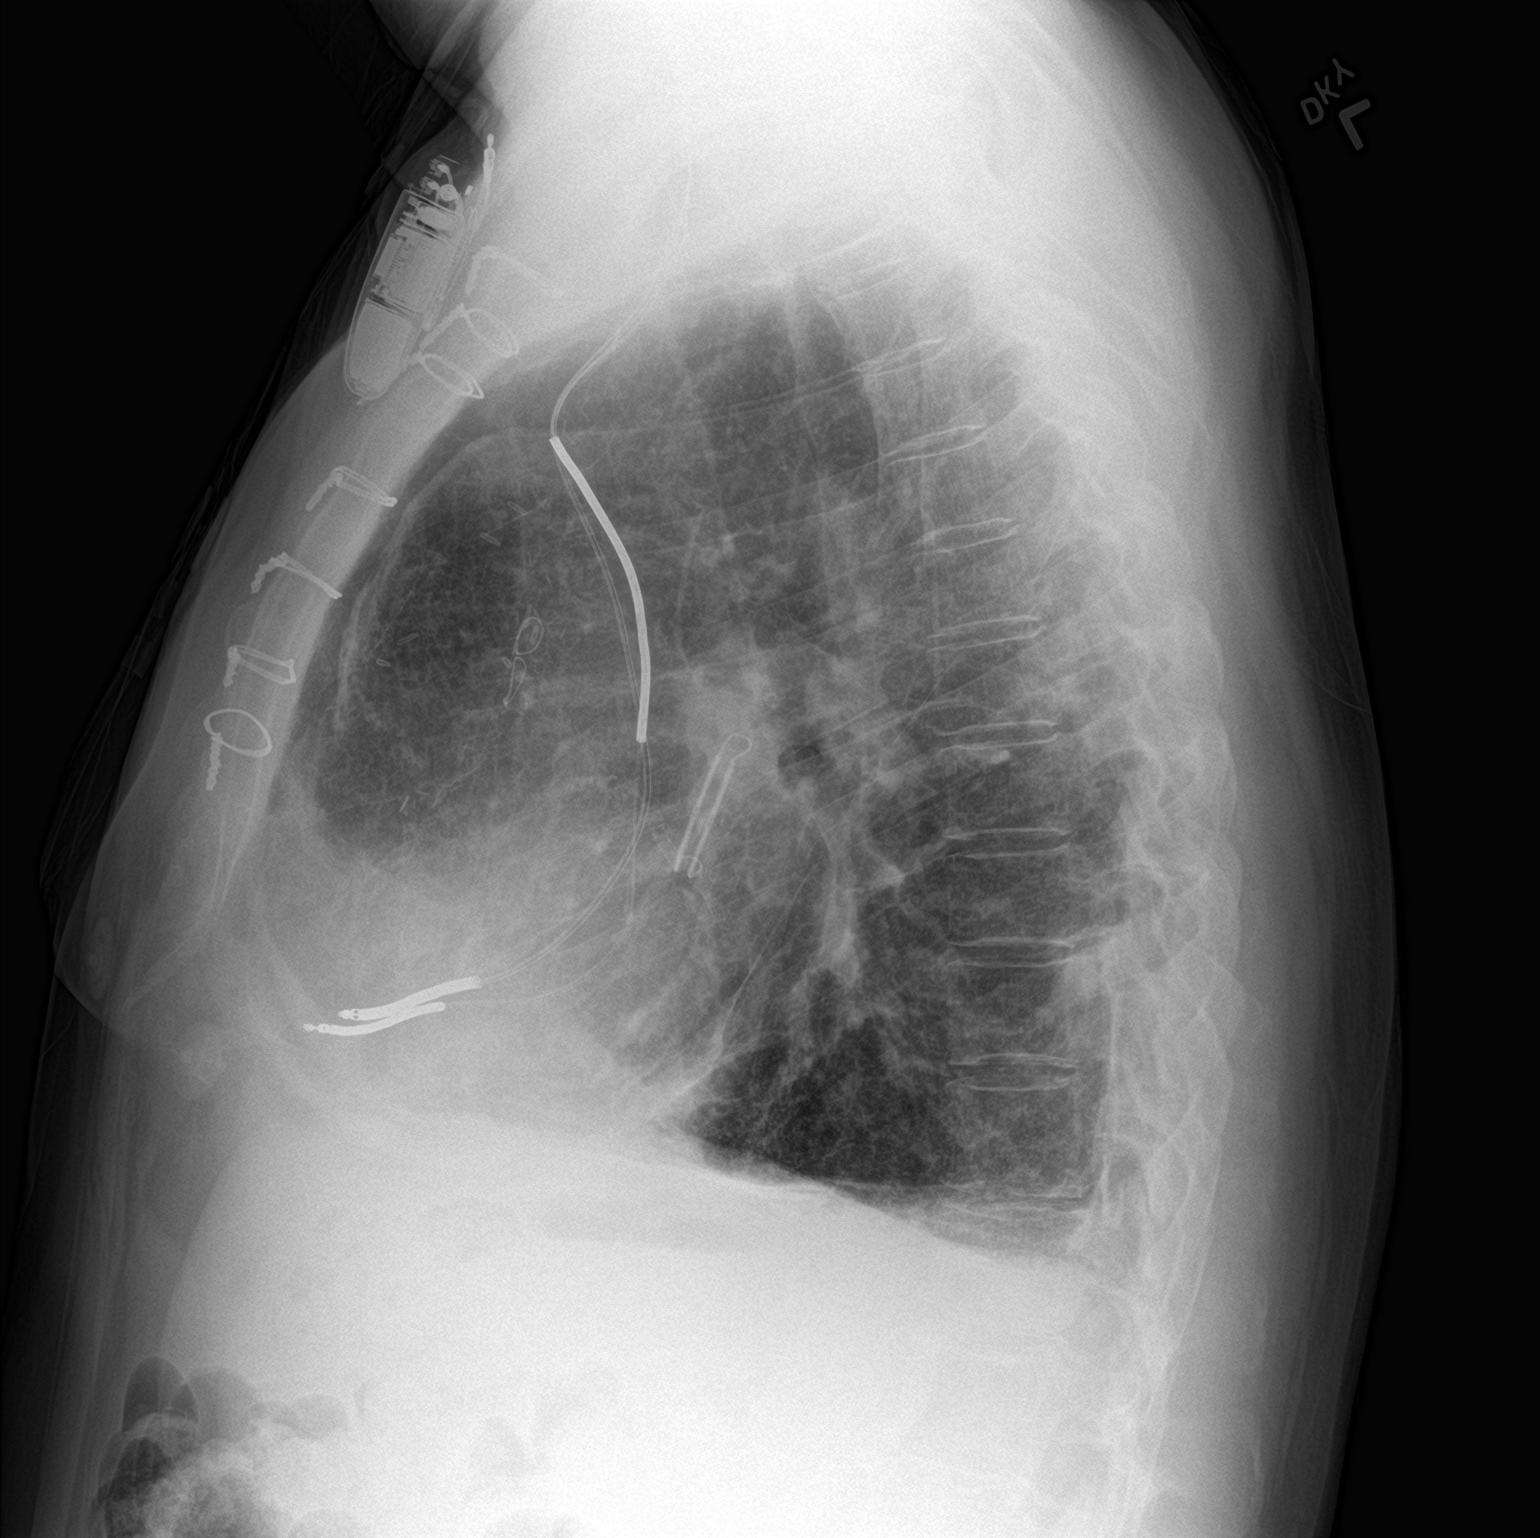

[2 of 2 positions shown; findings below may reference images not displayed]

FINDINGS: Postoperative changes in the mediastinum. Cardiac pacemaker. Cardiac
enlargement. Emphysematous changes in the lungs. Diffuse
interstitial changes could represent edema or fibrosis. Calcified
pleural plaques with blunting of the costophrenic angles likely
representing pleural thickening. No change in appearance since
previous study. No evidence of superimposed infiltration or
consolidation.
IMPRESSION: Cardiac enlargement. Emphysematous changes with diffuse interstitial
changes suggesting edema or fibrosis. Calcified pleural plaques.

## 2021-03-13 ENCOUNTER — Emergency Department (HOSPITAL_COMMUNITY)
Admission: EM | Admit: 2021-03-13 | Discharge: 2021-03-13 | Disposition: A | Discharge status: Home / Self Care | Payer: No Typology Code available for payment source | Attending: Emergency Medicine | Admitting: Emergency Medicine

## 2021-03-13 ENCOUNTER — Encounter (HOSPITAL_COMMUNITY): Payer: Self-pay

## 2021-03-13 ENCOUNTER — Other Ambulatory Visit: Payer: Self-pay

## 2021-03-13 ENCOUNTER — Emergency Department (HOSPITAL_COMMUNITY): Payer: No Typology Code available for payment source

## 2021-03-13 DIAGNOSIS — I4891 Unspecified atrial fibrillation: Secondary | ICD-10-CM | POA: Insufficient documentation

## 2021-03-13 DIAGNOSIS — R42 Dizziness and giddiness: Secondary | ICD-10-CM | POA: Insufficient documentation

## 2021-03-13 DIAGNOSIS — R61 Generalized hyperhidrosis: Secondary | ICD-10-CM | POA: Diagnosis not present

## 2021-03-13 DIAGNOSIS — I509 Heart failure, unspecified: Secondary | ICD-10-CM | POA: Insufficient documentation

## 2021-03-13 DIAGNOSIS — Z7982 Long term (current) use of aspirin: Secondary | ICD-10-CM | POA: Insufficient documentation

## 2021-03-13 DIAGNOSIS — Z79899 Other long term (current) drug therapy: Secondary | ICD-10-CM | POA: Diagnosis not present

## 2021-03-13 DIAGNOSIS — R55 Syncope and collapse: Secondary | ICD-10-CM | POA: Diagnosis not present

## 2021-03-13 DIAGNOSIS — R0602 Shortness of breath: Secondary | ICD-10-CM | POA: Diagnosis not present

## 2021-03-13 DIAGNOSIS — Z7951 Long term (current) use of inhaled steroids: Secondary | ICD-10-CM | POA: Diagnosis not present

## 2021-03-13 DIAGNOSIS — J449 Chronic obstructive pulmonary disease, unspecified: Secondary | ICD-10-CM | POA: Insufficient documentation

## 2021-03-13 DIAGNOSIS — Z951 Presence of aortocoronary bypass graft: Secondary | ICD-10-CM | POA: Diagnosis not present

## 2021-03-13 DIAGNOSIS — R0789 Other chest pain: Secondary | ICD-10-CM | POA: Diagnosis present

## 2021-03-13 DIAGNOSIS — I2510 Atherosclerotic heart disease of native coronary artery without angina pectoris: Secondary | ICD-10-CM | POA: Diagnosis not present

## 2021-03-13 DIAGNOSIS — R519 Headache, unspecified: Secondary | ICD-10-CM | POA: Diagnosis not present

## 2021-03-13 DIAGNOSIS — Z7901 Long term (current) use of anticoagulants: Secondary | ICD-10-CM | POA: Insufficient documentation

## 2021-03-13 LAB — CBC
HCT: 42 % (ref 39.0–52.0)
Hemoglobin: 14 g/dL (ref 13.0–17.0)
MCH: 31.2 pg (ref 26.0–34.0)
MCHC: 33.3 g/dL (ref 30.0–36.0)
MCV: 93.5 fL (ref 80.0–100.0)
Platelets: 83 10*3/uL — ABNORMAL LOW (ref 150–400)
RBC: 4.49 MIL/uL (ref 4.22–5.81)
RDW: 15 % (ref 11.5–15.5)
WBC: 5.7 10*3/uL (ref 4.0–10.5)
nRBC: 0 % (ref 0.0–0.2)

## 2021-03-13 LAB — BASIC METABOLIC PANEL
Anion gap: 11 (ref 5–15)
BUN: 13 mg/dL (ref 8–23)
CO2: 27 mmol/L (ref 22–32)
Calcium: 8.5 mg/dL — ABNORMAL LOW (ref 8.9–10.3)
Chloride: 100 mmol/L (ref 98–111)
Creatinine, Ser: 1.47 mg/dL — ABNORMAL HIGH (ref 0.61–1.24)
GFR, Estimated: 52 mL/min — ABNORMAL LOW (ref >60)
Glucose, Bld: 93 mg/dL (ref 70–99)
Potassium: 3.8 mmol/L (ref 3.5–5.1)
Sodium: 138 mmol/L (ref 135–145)

## 2021-03-13 LAB — MAGNESIUM: Magnesium: 2.1 mg/dL (ref 1.7–2.4)

## 2021-03-13 LAB — TROPONIN I (HIGH SENSITIVITY)
Troponin I (High Sensitivity): 11 ng/L (ref <18)
Troponin I (High Sensitivity): 14 ng/L (ref <18)

## 2021-03-13 MED ORDER — ISOSORBIDE MONONITRATE ER 30 MG PO TB24: 30.0000 mg | ORAL_TABLET | Freq: Every day | ORAL | 1 refills | Status: DC

## 2021-03-13 NOTE — Discharge Instructions (Signed)
Please call your cardiologist tomorrow to follow-up about your chest discomfort and lightheadedness.  Take the medication I prescribed as directed. ?Your caregiver has diagnosed you as having chest pain that is not specific for one problem, but does not require admission.  You are at low risk for an acute heart condition or other serious illness. Chest pain comes from many different causes.  ?SEEK IMMEDIATE MEDICAL ATTENTION IF: ?You have severe chest pain, especially if the pain is crushing or pressure-like and spreads to the arms, back, neck, or jaw, or if you have sweating, nausea (feeling sick to your stomach), or shortness of breath. THIS IS AN EMERGENCY. Don't wait to see if the pain will go away. Get medical help at once. Call 911 or [phone] (operator). DO NOT drive yourself to the hospital.  ?Your chest pain gets worse and does not go away with rest.  ?You have an attack of chest pain lasting longer than usual, despite rest and treatment with the medications your caregiver has prescribed.  ?You wake from sleep with chest pain or shortness of breath.  ?You feel dizzy or faint.  ?You have chest pain not typical of your usual pain for which you originally saw your caregiver. ? ?

## 2021-03-13 NOTE — ED Notes (Signed)
Pacemaker interrogated with medtronic interrogation system. Data successfully sent.  ?

## 2021-03-13 NOTE — ED Notes (Signed)
Got patient undressed on the monitor did ekg shown to Dr Max Rivas patient is resting with call bell in reach ?

## 2021-03-13 NOTE — ED Provider Notes (Signed)
[facility] EMERGENCY DEPARTMENT Provider Note   CSN: [id number] Arrival date & time: 03/13/21  1518     History  Chief Complaint  Patient presents with   Chest Pain    Patient arrives via EMS from home with complaints of chest pain and SOB that started about 30 mins ago. Patient does have defibrillator and states that it did not go off today but did go off 3 days ago. EMS gave 1 nitro and aspirin prior to arrival. BP 150/86 HR 78 afib on the monitor, patient on 4 liters of oxygen via nasal cannula at baseline, BG 118.     Lucy Sacre is a 71 y/o M with a hx of CAD s/p CABG, CHF (EF 30-35% per last echo March 2022), A-fib (on Eliquis), and end-stage emphysema who presents to the ED for an episode of mid-sternal chest pressure with accompanying SOB, diaphoresis, lightheadedness, and clamminess that began around 1400 this afternoon and has since resolved. Pt states he woke up today feeling at his baseline with a plan to see his PCP for a scheduled appointment regarding pain management. This afternoon the pt reports he was walking his trash outside when his chest pain developed suddenly. Pain was non- radiating. Pt had associated near syncope and sat on the ground. Pt contacted EMS. Denies any worsening pain with position. Pt reports EMS gave him Nitro and ASA while en route  and his pain resolved. His only complaint at this time is mild b/l feet paresthesias. Of note, pt reports he increased his home oxygen during this episode secondary to feeling SOB. Pt also mentions a frontal headache and b/l hand paresthesia at the time of the event which have resolved.  Denies any focal weakness, slurred speech or speech difficulties. Pt mentions his cardiology team contacted him two days prior regarding a run of V-tach and subsequent defibrillation, but was asleep and said he did not recall this. Denies his defibrillator going off today. Pt denies neck pain, abd pain, leg pain or swelling, or  any other medical concerns.    Chest Pain     Home Medications Prior to Admission medications   Medication Sig Start Date End Date Taking? Authorizing Provider  isosorbide mononitrate (IMDUR) 30 MG 24 hr tablet Take 1 tablet (30 mg total) by mouth daily. 03/13/21  Yes Ashaunti Treptow, PA-C  acetaminophen (TYLENOL) 325 MG tablet Take 2 tablets (650 mg total) by mouth every 6 (six) hours as needed for mild pain (or Fever >/= 101). Patient not taking: No sig reported 03/05/16   Robbie Lis, MD  acetaminophen (TYLENOL) 500 MG tablet Take 500-1,000 mg by mouth every 6 (six) hours as needed for moderate pain, headache or mild pain.    [provider]  amiodarone (PACERONE) 400 MG tablet Take 1 tablet (400 mg total) by mouth 2 (two) times daily. 03/19/20   Shirley Friar, PA-C  apixaban (ELIQUIS) 5 MG TABS tablet Take 5 mg by mouth 2 (two) times daily.    [provider]  aspirin 81 MG chewable tablet Chew 1 tablet (81 mg total) by mouth daily. 05/16/19   Kroeger, Lorelee Cover., PA-C  atorvastatin (LIPITOR) 80 MG tablet Take 1 tablet (80 mg total) by mouth daily. Patient not taking: No sig reported 05/15/19   Preciliano Castell Butts., PA-C  Cholecalciferol (VITAMIN D3) 50 MCG (2000 UT) TABS Take 2,000 Units by mouth daily.    [provider]  furosemide (LASIX) 40 MG tablet Take 40  mg by mouth 2 (two) times daily.     [provider]  gabapentin (NEURONTIN) 100 MG capsule Take 2 capsules (200 mg total) by mouth 2 (two) times daily. 06/01/19   Barrett, Erin R, PA-C  levalbuterol (XOPENEX HFA) 45 MCG/ACT inhaler Inhale 2 puffs into the lungs every 6 (six) hours as needed for wheezing or shortness of breath.    [provider]  levothyroxine (SYNTHROID) 75 MCG tablet Take 75 mcg by mouth daily before breakfast.    [provider]  metoprolol succinate (TOPROL-XL) 25 MG 24 hr tablet Take 12.5 mg by mouth in the morning.    [provider]   mexiletine (MEXITIL) 150 MG capsule Take 2 capsules (300 mg total) by mouth 2 (two) times daily. Patient taking differently: Take 150 mg by mouth 2 (two) times daily. 12/07/19   Tommie Raymond, NP  mometasone [facility]) 220 MCG/INH inhaler Inhale 2 puffs into the lungs 2 (two) times daily.    [provider]  montelukast (SINGULAIR) 10 MG tablet Take 10 mg by mouth at bedtime.    [provider]  Multiple Vitamin (MULTIVITAMIN WITH MINERALS) TABS tablet Take 1 tablet by mouth daily with breakfast.    [provider]  nitroGLYCERIN (NITROSTAT) 0.4 MG SL tablet Place 0.4 mg under the tongue every 5 (five) minutes as needed for chest pain.     [provider]  omega-3 acid ethyl esters (LOVAZA) 1 g capsule Take 1 capsule (1 g total) by mouth 2 (two) times daily. Patient not taking: Reported on 03/18/2020 03/05/16   Robbie Lis, MD  Omega-3 Fatty Acids (FISH OIL) 1000 MG CAPS Take 1,000 mg by mouth in the morning and at bedtime.    [provider]  omeprazole (PRILOSEC) 20 MG capsule Take 20 mg by mouth in the morning and at bedtime.    [provider]  potassium chloride SA (KLOR-CON) 20 MEQ tablet Take 1 tablet (20 mEq total) by mouth 2 (two) times daily. 03/19/20   Shirley Friar, PA-C  Propylene Glycol (SYSTANE BALANCE) 0.6 % SOLN Place 1 drop into both eyes See admin instructions. Place 1 drop into each eye four times a day and apply a warm compress for 10 minutes afterwards     [provider]  rosuvastatin (CRESTOR) 40 MG tablet Take 20 mg by mouth at bedtime.    [provider]  Tiotropium Bromide-Olodaterol 2.5-2.5 MCG/ACT AERS Inhale 1 puff into the lungs in the morning and at bedtime.    [provider]      Allergies    Crestor [rosuvastatin calcium], Rosuvastatin, Albuterol, and Atorvastatin    Review of Systems   Review of Systems  Cardiovascular:  Positive for chest pain.   Physical  Exam Updated Vital Signs BP 129/64    Pulse (!) 54    Temp 98.6 F (37 C) (Oral)    Resp 17    Ht 5\' 11"  (1.803 m)    Wt 106.6 kg    SpO2 98%    BMI 32.78 kg/m  Physical Exam Vitals and nursing note reviewed.  Constitutional:      General: He is not in acute distress.    Appearance: He is well-developed. He is not diaphoretic.  HENT:     Head: Normocephalic and atraumatic.  Eyes:     General: No scleral icterus.    Conjunctiva/sclera: Conjunctivae normal.  Cardiovascular:     Rate and Rhythm: Normal rate and regular  rhythm.     Heart sounds: Normal heart sounds.  Pulmonary:     Effort: Pulmonary effort is normal. No respiratory distress.     Breath sounds: Normal breath sounds.  Abdominal:     Palpations: Abdomen is soft.     Tenderness: There is no abdominal tenderness.  Musculoskeletal:     Cervical back: Normal range of motion and neck supple.     Right lower leg: No edema.     Left lower leg: No edema.  Skin:    General: Skin is warm and dry.  Neurological:     Mental Status: He is alert.  Psychiatric:        Behavior: Behavior normal.    ED Results / Procedures / Treatments   Labs (all labs ordered are listed, but only abnormal results are displayed) Labs Reviewed  CBC - Abnormal; Notable for the following components:      Result Value   Platelets 83 (*)    All other components within normal limits  BASIC METABOLIC PANEL - Abnormal; Notable for the following components:   Creatinine, Ser 1.47 (*)    Calcium 8.5 (*)    GFR, Estimated 52 (*)    All other components within normal limits  MAGNESIUM  TROPONIN I (HIGH SENSITIVITY)  TROPONIN I (HIGH SENSITIVITY)    EKG EKG Interpretation  Date/Time:  Thursday March 13 2021 15:51:52 EST Ventricular Rate:  56 PR Interval:  207 QRS Duration: 132 QT Interval:  505 QTC Calculation: 488 R Axis:   35 Text Interpretation: Sinus rhythm Left bundle branch block No significant change since last tracing Confirmed by  Deno Etienne [phone]) on 03/13/2021 3:56:29 PM  Radiology DG Chest Port 1 View  Result Date: 03/13/2021 CLINICAL DATA:  Left-sided weakness, chest pain, near syncope EXAM: PORTABLE CHEST 1 VIEW COMPARISON:  01/03/2021 FINDINGS: Single frontal view of the chest demonstrates stable AICD. Cardiac silhouette is unchanged. Stable calcified right pleural plaques and pleural thickening. Chronic background emphysema and scarring. Increased bibasilar interstitial prominence could reflect developing interstitial edema. No airspace disease, effusion, or pneumothorax. No acute bony abnormalities. IMPRESSION: 1. Increased bibasilar interstitial prominence, which could reflect developing interstitial edema. 2. Emphysema. 3. Chronic calcified right pleural plaques. Electronically Signed   By: Randa Ngo M.D.   On: 03/13/2021 16:05    Procedures Procedures    Medications Ordered in ED Medications - No data to display  ED Course/ Medical Decision Making/ A&P Clinical Course as of 03/13/21 2345  Thu Mar 13, 2021  1944 Case discussed with Dr. Candee Furbish.  We reviewed the patient's chart and work-up today.  Although the patient is a high risk cardiac patient Dr. Marlou Porch feels that his risk of recurrent life-threatening or significant cardiac event in the next 30 days is very low and advises to start the patient on Imdur.  Patient states that he has not been on this medication in the past and does not have any allergies to it.  He is comfortable with the plan for discharge with close follow-up with [facility] cardiologist [facility].  He plans to call him tomorrow morning.  He has had no recurrence of his chest pain during his ED visit. [AH]    Clinical Course User Index [AH] Margarita Mail, PA-C                           Final Clinical Impression(s) / ED Diagnoses Final diagnoses:  Chest discomfort  Near syncope   This patient presents to the ED for concern of cp, this involves an extensive number of treatment  options, and is a complaint that carries with it a high risk of complications and morbidity.  The differential diagnosis includes The emergent differential diagnosis of chest pain includes: Acute coronary syndrome, pericarditis, aortic dissection, pulmonary embolism, tension pneumothorax, pneumonia, and esophageal rupture.    Co morbidities that complicate the patient evaluation  CAD, CHF s/p AICD, CABG, COPD, HX of Vtach   Additional history obtained:   Lab Tests:  I Ordered, and personally interpreted labs.  The pertinent results include:    -as discussed in ED Course   Imaging Studies ordered:  I ordered imaging studies including CXR I independently visualized and interpreted imaging which showed no acute findings I agree with the radiologist interpretation   Cardiac Monitoring:  The patient was maintained on a cardiac monitor.  I personally viewed and interpreted the cardiac monitored which showed an underlying rhythm of: Sinus rhythm , LBBB    Test Considered:  CTA- PE - LOw suspcion for PE as cause      Consultations Obtained:  I requested consultation with the Cadiorlogy- Dr. Marlou Porch As discussed in ED course   Problem List / ED Course:  Acute chest pain with Near syncope- High Risk   Reevaluation:  After the interventions noted above, I reevaluated the patient and found that they have :resolved   Social Determinants of Health:  [facility]   Dispostion:  I considered admission for the patient but will d/c per plan with cardiology on IMDUR 30 mg   Rx / DC Orders ED Discharge Orders          Ordered    isosorbide mononitrate (IMDUR) 30 MG 24 hr tablet  Daily        03/13/21 1944              Margarita Mail, PA-C 03/13/21 [place], [place], DO 03/15/21 1457

## 2021-04-25 ENCOUNTER — Other Ambulatory Visit: Payer: Self-pay

## 2021-04-25 ENCOUNTER — Encounter (HOSPITAL_COMMUNITY): Payer: Self-pay

## 2021-04-25 ENCOUNTER — Inpatient Hospital Stay (HOSPITAL_COMMUNITY): Payer: Medicare Other

## 2021-04-25 ENCOUNTER — Inpatient Hospital Stay (HOSPITAL_COMMUNITY)
Admission: EM | Admit: 2021-04-25 | Discharge: 2021-04-28 | DRG: 871 | Disposition: A | Discharge status: Home Health | Payer: Medicare Other | Attending: Internal Medicine | Admitting: Internal Medicine

## 2021-04-25 ENCOUNTER — Emergency Department (HOSPITAL_COMMUNITY): Payer: Medicare Other

## 2021-04-25 DIAGNOSIS — I255 Ischemic cardiomyopathy: Secondary | ICD-10-CM | POA: Diagnosis present

## 2021-04-25 DIAGNOSIS — K219 Gastro-esophageal reflux disease without esophagitis: Secondary | ICD-10-CM | POA: Diagnosis present

## 2021-04-25 DIAGNOSIS — G4733 Obstructive sleep apnea (adult) (pediatric): Secondary | ICD-10-CM | POA: Diagnosis present

## 2021-04-25 DIAGNOSIS — A419 Sepsis, unspecified organism: Secondary | ICD-10-CM | POA: Diagnosis present

## 2021-04-25 DIAGNOSIS — Z9981 Dependence on supplemental oxygen: Secondary | ICD-10-CM

## 2021-04-25 DIAGNOSIS — I472 Ventricular tachycardia, unspecified: Secondary | ICD-10-CM | POA: Diagnosis present

## 2021-04-25 DIAGNOSIS — N1832 Chronic kidney disease, stage 3b: Secondary | ICD-10-CM | POA: Diagnosis present

## 2021-04-25 DIAGNOSIS — J438 Other emphysema: Secondary | ICD-10-CM | POA: Diagnosis not present

## 2021-04-25 DIAGNOSIS — I5022 Chronic systolic (congestive) heart failure: Secondary | ICD-10-CM | POA: Diagnosis present

## 2021-04-25 DIAGNOSIS — E876 Hypokalemia: Secondary | ICD-10-CM | POA: Diagnosis present

## 2021-04-25 DIAGNOSIS — J439 Emphysema, unspecified: Secondary | ICD-10-CM | POA: Diagnosis present

## 2021-04-25 DIAGNOSIS — Z87891 Personal history of nicotine dependence: Secondary | ICD-10-CM

## 2021-04-25 DIAGNOSIS — K429 Umbilical hernia without obstruction or gangrene: Secondary | ICD-10-CM | POA: Diagnosis present

## 2021-04-25 DIAGNOSIS — I480 Paroxysmal atrial fibrillation: Secondary | ICD-10-CM | POA: Diagnosis present

## 2021-04-25 DIAGNOSIS — M19022 Primary osteoarthritis, left elbow: Secondary | ICD-10-CM | POA: Diagnosis present

## 2021-04-25 DIAGNOSIS — Z955 Presence of coronary angioplasty implant and graft: Secondary | ICD-10-CM

## 2021-04-25 DIAGNOSIS — Z79899 Other long term (current) drug therapy: Secondary | ICD-10-CM

## 2021-04-25 DIAGNOSIS — I2510 Atherosclerotic heart disease of native coronary artery without angina pectoris: Secondary | ICD-10-CM | POA: Diagnosis present

## 2021-04-25 DIAGNOSIS — Z7982 Long term (current) use of aspirin: Secondary | ICD-10-CM

## 2021-04-25 DIAGNOSIS — Z951 Presence of aortocoronary bypass graft: Secondary | ICD-10-CM

## 2021-04-25 DIAGNOSIS — M170 Bilateral primary osteoarthritis of knee: Secondary | ICD-10-CM | POA: Diagnosis present

## 2021-04-25 DIAGNOSIS — Z20822 Contact with and (suspected) exposure to covid-19: Secondary | ICD-10-CM | POA: Diagnosis present

## 2021-04-25 DIAGNOSIS — J9601 Acute respiratory failure with hypoxia: Secondary | ICD-10-CM

## 2021-04-25 DIAGNOSIS — J189 Pneumonia, unspecified organism: Secondary | ICD-10-CM | POA: Diagnosis present

## 2021-04-25 DIAGNOSIS — I482 Chronic atrial fibrillation, unspecified: Secondary | ICD-10-CM | POA: Diagnosis not present

## 2021-04-25 DIAGNOSIS — Z888 Allergy status to other drugs, medicaments and biological substances status: Secondary | ICD-10-CM

## 2021-04-25 DIAGNOSIS — Z86711 Personal history of pulmonary embolism: Secondary | ICD-10-CM

## 2021-04-25 DIAGNOSIS — E7800 Pure hypercholesterolemia, unspecified: Secondary | ICD-10-CM | POA: Diagnosis present

## 2021-04-25 DIAGNOSIS — E039 Hypothyroidism, unspecified: Secondary | ICD-10-CM | POA: Diagnosis present

## 2021-04-25 DIAGNOSIS — Z6831 Body mass index (BMI) 31.0-31.9, adult: Secondary | ICD-10-CM

## 2021-04-25 DIAGNOSIS — Z8249 Family history of ischemic heart disease and other diseases of the circulatory system: Secondary | ICD-10-CM

## 2021-04-25 DIAGNOSIS — M19021 Primary osteoarthritis, right elbow: Secondary | ICD-10-CM | POA: Diagnosis present

## 2021-04-25 DIAGNOSIS — Z95810 Presence of automatic (implantable) cardiac defibrillator: Secondary | ICD-10-CM | POA: Diagnosis present

## 2021-04-25 DIAGNOSIS — R6520 Severe sepsis without septic shock: Secondary | ICD-10-CM

## 2021-04-25 DIAGNOSIS — J449 Chronic obstructive pulmonary disease, unspecified: Secondary | ICD-10-CM | POA: Diagnosis present

## 2021-04-25 DIAGNOSIS — Z23 Encounter for immunization: Secondary | ICD-10-CM | POA: Diagnosis present

## 2021-04-25 DIAGNOSIS — J9621 Acute and chronic respiratory failure with hypoxia: Secondary | ICD-10-CM | POA: Diagnosis present

## 2021-04-25 DIAGNOSIS — Z79890 Hormone replacement therapy (postmenopausal): Secondary | ICD-10-CM

## 2021-04-25 DIAGNOSIS — Z7901 Long term (current) use of anticoagulants: Secondary | ICD-10-CM

## 2021-04-25 DIAGNOSIS — I252 Old myocardial infarction: Secondary | ICD-10-CM

## 2021-04-25 LAB — CBC WITH DIFFERENTIAL/PLATELET
Abs Immature Granulocytes: 0.08 10*3/uL — ABNORMAL HIGH (ref 0.00–0.07)
Abs Immature Granulocytes: 0.16 10*3/uL — ABNORMAL HIGH (ref 0.00–0.07)
Basophils Absolute: 0 10*3/uL (ref 0.0–0.1)
Basophils Absolute: 0 10*3/uL (ref 0.0–0.1)
Basophils Relative: 0 %
Basophils Relative: 0 %
Eosinophils Absolute: 0 10*3/uL (ref 0.0–0.5)
Eosinophils Absolute: 0.4 10*3/uL (ref 0.0–0.5)
Eosinophils Relative: 0 %
Eosinophils Relative: 3 %
HCT: 44.6 % (ref 39.0–52.0)
HCT: 41.1 % (ref 39.0–52.0)
Hemoglobin: 15.1 g/dL (ref 13.0–17.0)
Hemoglobin: 13.8 g/dL (ref 13.0–17.0)
Immature Granulocytes: 1 %
Immature Granulocytes: 1 %
Lymphocytes Relative: 5 %
Lymphocytes Relative: 3 %
Lymphs Abs: 0.7 10*3/uL (ref 0.7–4.0)
Lymphs Abs: 0.5 10*3/uL — ABNORMAL LOW (ref 0.7–4.0)
MCH: 30.9 pg (ref 26.0–34.0)
MCH: 30.8 pg (ref 26.0–34.0)
MCHC: 33.9 g/dL (ref 30.0–36.0)
MCHC: 33.6 g/dL (ref 30.0–36.0)
MCV: 91.4 fL (ref 80.0–100.0)
MCV: 91.7 fL (ref 80.0–100.0)
Monocytes Absolute: 1.2 10*3/uL — ABNORMAL HIGH (ref 0.1–1.0)
Monocytes Absolute: 0.9 10*3/uL (ref 0.1–1.0)
Monocytes Relative: 9 %
Monocytes Relative: 6 %
Neutro Abs: 11.4 10*3/uL — ABNORMAL HIGH (ref 1.7–7.7)
Neutro Abs: 12.5 10*3/uL — ABNORMAL HIGH (ref 1.7–7.7)
Neutrophils Relative %: 85 %
Neutrophils Relative %: 87 %
Platelets: 152 10*3/uL (ref 150–400)
Platelets: 131 10*3/uL — ABNORMAL LOW (ref 150–400)
RBC: 4.88 MIL/uL (ref 4.22–5.81)
RBC: 4.48 MIL/uL (ref 4.22–5.81)
RDW: 14.7 % (ref 11.5–15.5)
RDW: 14.6 % (ref 11.5–15.5)
WBC: 13.4 10*3/uL — ABNORMAL HIGH (ref 4.0–10.5)
WBC: 14.5 10*3/uL — ABNORMAL HIGH (ref 4.0–10.5)
nRBC: 0 % (ref 0.0–0.2)
nRBC: 0 % (ref 0.0–0.2)

## 2021-04-25 LAB — I-STAT CHEM 8, ED
BUN: 16 mg/dL (ref 8–23)
Calcium, Ion: 1 mmol/L — ABNORMAL LOW (ref 1.15–1.40)
Chloride: 101 mmol/L (ref 98–111)
Creatinine, Ser: 1.4 mg/dL — ABNORMAL HIGH (ref 0.61–1.24)
Glucose, Bld: 153 mg/dL — ABNORMAL HIGH (ref 70–99)
HCT: 45 % (ref 39.0–52.0)
Hemoglobin: 15.3 g/dL (ref 13.0–17.0)
Potassium: 3.2 mmol/L — ABNORMAL LOW (ref 3.5–5.1)
Sodium: 137 mmol/L (ref 135–145)
TCO2: 25 mmol/L (ref 22–32)

## 2021-04-25 LAB — PROTIME-INR
INR: 1.4 — ABNORMAL HIGH (ref 0.8–1.2)
Prothrombin Time: 16.6 seconds — ABNORMAL HIGH (ref 11.4–15.2)

## 2021-04-25 LAB — URINALYSIS, ROUTINE W REFLEX MICROSCOPIC
Bilirubin Urine: NEGATIVE
Glucose, UA: NEGATIVE mg/dL
Hgb urine dipstick: NEGATIVE
Ketones, ur: 5 mg/dL — AB
Leukocytes,Ua: NEGATIVE
Nitrite: NEGATIVE
Protein, ur: NEGATIVE mg/dL
Specific Gravity, Urine: 1.012 (ref 1.005–1.030)
pH: 8 (ref 5.0–8.0)

## 2021-04-25 LAB — I-STAT VENOUS BLOOD GAS, ED
Acid-Base Excess: 3 mmol/L — ABNORMAL HIGH (ref 0.0–2.0)
Bicarbonate: 24.7 mmol/L (ref 20.0–28.0)
Calcium, Ion: 1 mmol/L — ABNORMAL LOW (ref 1.15–1.40)
HCT: 44 % (ref 39.0–52.0)
Hemoglobin: 15 g/dL (ref 13.0–17.0)
O2 Saturation: 88 %
Potassium: 3.2 mmol/L — ABNORMAL LOW (ref 3.5–5.1)
Sodium: 137 mmol/L (ref 135–145)
TCO2: 26 mmol/L (ref 22–32)
pCO2, Ven: 30.3 mmHg — ABNORMAL LOW (ref 44–60)
pH, Ven: 7.519 — ABNORMAL HIGH (ref 7.25–7.43)
pO2, Ven: 48 mmHg — ABNORMAL HIGH (ref 32–45)

## 2021-04-25 LAB — COMPREHENSIVE METABOLIC PANEL
ALT: 31 U/L (ref 0–44)
AST: 40 U/L (ref 15–41)
Albumin: 3.6 g/dL (ref 3.5–5.0)
Alkaline Phosphatase: 65 U/L (ref 38–126)
Anion gap: 12 (ref 5–15)
BUN: 14 mg/dL (ref 8–23)
CO2: 22 mmol/L (ref 22–32)
Calcium: 8.6 mg/dL — ABNORMAL LOW (ref 8.9–10.3)
Chloride: 102 mmol/L (ref 98–111)
Creatinine, Ser: 1.51 mg/dL — ABNORMAL HIGH (ref 0.61–1.24)
GFR, Estimated: 50 mL/min — ABNORMAL LOW (ref >60)
Glucose, Bld: 156 mg/dL — ABNORMAL HIGH (ref 70–99)
Potassium: 3.2 mmol/L — ABNORMAL LOW (ref 3.5–5.1)
Sodium: 136 mmol/L (ref 135–145)
Total Bilirubin: 1.3 mg/dL — ABNORMAL HIGH (ref 0.3–1.2)
Total Protein: 6.4 g/dL — ABNORMAL LOW (ref 6.5–8.1)

## 2021-04-25 LAB — BASIC METABOLIC PANEL
Anion gap: 11 (ref 5–15)
BUN: 14 mg/dL (ref 8–23)
CO2: 24 mmol/L (ref 22–32)
Calcium: 8.4 mg/dL — ABNORMAL LOW (ref 8.9–10.3)
Chloride: 102 mmol/L (ref 98–111)
Creatinine, Ser: 1.68 mg/dL — ABNORMAL HIGH (ref 0.61–1.24)
GFR, Estimated: 44 mL/min — ABNORMAL LOW (ref >60)
Glucose, Bld: 153 mg/dL — ABNORMAL HIGH (ref 70–99)
Potassium: 3.3 mmol/L — ABNORMAL LOW (ref 3.5–5.1)
Sodium: 137 mmol/L (ref 135–145)

## 2021-04-25 LAB — HIV ANTIBODY (ROUTINE TESTING W REFLEX): HIV Screen 4th Generation wRfx: NONREACTIVE

## 2021-04-25 LAB — LACTIC ACID, PLASMA
Lactic Acid, Venous: 1.9 mmol/L (ref 0.5–1.9)
Lactic Acid, Venous: 2.5 mmol/L (ref 0.5–1.9)

## 2021-04-25 LAB — C-REACTIVE PROTEIN: CRP: 11.6 mg/dL — ABNORMAL HIGH (ref <1.0)

## 2021-04-25 LAB — D-DIMER, QUANTITATIVE: D-Dimer, Quant: 0.68 ug/mL-FEU — ABNORMAL HIGH (ref 0.00–0.50)

## 2021-04-25 LAB — TROPONIN I (HIGH SENSITIVITY)
Troponin I (High Sensitivity): 99 ng/L — ABNORMAL HIGH (ref <18)
Troponin I (High Sensitivity): 161 ng/L (ref <18)
Troponin I (High Sensitivity): 179 ng/L (ref <18)
Troponin I (High Sensitivity): 171 ng/L (ref <18)

## 2021-04-25 LAB — HEPATIC FUNCTION PANEL
ALT: 39 U/L (ref 0–44)
AST: 52 U/L — ABNORMAL HIGH (ref 15–41)
Albumin: 3.3 g/dL — ABNORMAL LOW (ref 3.5–5.0)
Alkaline Phosphatase: 57 U/L (ref 38–126)
Bilirubin, Direct: 0.3 mg/dL — ABNORMAL HIGH (ref 0.0–0.2)
Indirect Bilirubin: 1.2 mg/dL — ABNORMAL HIGH (ref 0.3–0.9)
Total Bilirubin: 1.5 mg/dL — ABNORMAL HIGH (ref 0.3–1.2)
Total Protein: 6.4 g/dL — ABNORMAL LOW (ref 6.5–8.1)

## 2021-04-25 LAB — MAGNESIUM: Magnesium: 2.2 mg/dL (ref 1.7–2.4)

## 2021-04-25 LAB — CORTISOL: Cortisol, Plasma: 24.7 ug/dL

## 2021-04-25 LAB — PROCALCITONIN: Procalcitonin: 28.65 ng/mL

## 2021-04-25 LAB — TSH: TSH: 1.492 u[IU]/mL (ref 0.350–4.500)

## 2021-04-25 LAB — BRAIN NATRIURETIC PEPTIDE: B Natriuretic Peptide: 580.4 pg/mL — ABNORMAL HIGH (ref 0.0–100.0)

## 2021-04-25 LAB — RESP PANEL BY RT-PCR (FLU A&B, COVID) ARPGX2
Influenza A by PCR: NEGATIVE
Influenza B by PCR: NEGATIVE
SARS Coronavirus 2 by RT PCR: NEGATIVE

## 2021-04-25 LAB — APTT: aPTT: 35 seconds (ref 24–36)

## 2021-04-25 MED ORDER — LACTATED RINGERS IV SOLN: INTRAVENOUS | Status: DC

## 2021-04-25 MED ORDER — SODIUM CHLORIDE 0.9 % IV SOLN
100.0000 mg | Freq: Two times a day (BID) | INTRAVENOUS | Status: DC
  Administered 2021-04-25: 100 mg via INTRAVENOUS
  Filled 2021-04-25: qty 100

## 2021-04-25 MED ORDER — PANTOPRAZOLE SODIUM 40 MG PO TBEC
40.0000 mg | DELAYED_RELEASE_TABLET | Freq: Every day | ORAL | Status: DC
  Administered 2021-04-25 – 2021-04-28 (×4): 40 mg via ORAL
  Filled 2021-04-25 (×4): qty 1

## 2021-04-25 MED ORDER — LEVOFLOXACIN 500 MG PO TABS
500.0000 mg | ORAL_TABLET | Freq: Every day | ORAL | Status: AC
  Administered 2021-04-25 – 2021-04-28 (×4): 500 mg via ORAL
  Filled 2021-04-25 (×4): qty 1

## 2021-04-25 MED ORDER — ACETAMINOPHEN 325 MG PO TABS
650.0000 mg | ORAL_TABLET | Freq: Once | ORAL | Status: AC
  Administered 2021-04-25: 650 mg via ORAL
  Filled 2021-04-25: qty 2

## 2021-04-25 MED ORDER — ACETAMINOPHEN 650 MG RE SUPP: 650.0000 mg | Freq: Four times a day (QID) | RECTAL | Status: DC | PRN

## 2021-04-25 MED ORDER — MEXILETINE HCL 150 MG PO CAPS
150.0000 mg | ORAL_CAPSULE | Freq: Two times a day (BID) | ORAL | Status: DC
  Administered 2021-04-25 – 2021-04-28 (×7): 150 mg via ORAL
  Filled 2021-04-25 (×8): qty 1

## 2021-04-25 MED ORDER — METOPROLOL SUCCINATE ER 25 MG PO TB24
12.5000 mg | ORAL_TABLET | Freq: Every morning | ORAL | Status: DC
  Administered 2021-04-26 – 2021-04-28 (×3): 12.5 mg via ORAL
  Filled 2021-04-25 (×3): qty 1

## 2021-04-25 MED ORDER — POTASSIUM CHLORIDE CRYS ER 20 MEQ PO TBCR
40.0000 meq | EXTENDED_RELEASE_TABLET | Freq: Once | ORAL | Status: AC
  Administered 2021-04-25: 40 meq via ORAL
  Filled 2021-04-25: qty 2

## 2021-04-25 MED ORDER — ARFORMOTEROL TARTRATE 15 MCG/2ML IN NEBU
15.0000 ug | INHALATION_SOLUTION | Freq: Two times a day (BID) | RESPIRATORY_TRACT | Status: DC
Start: 2021-04-25 — End: 2021-04-28
  Administered 2021-04-25 – 2021-04-28 (×6): 15 ug via RESPIRATORY_TRACT
  Filled 2021-04-25 (×6): qty 2

## 2021-04-25 MED ORDER — NITROGLYCERIN 0.4 MG SL SUBL: 0.4000 mg | SUBLINGUAL_TABLET | SUBLINGUAL | Status: DC | PRN

## 2021-04-25 MED ORDER — IPRATROPIUM-ALBUTEROL 0.5-2.5 (3) MG/3ML IN SOLN
3.0000 mL | Freq: Once | RESPIRATORY_TRACT | Status: AC
  Administered 2021-04-25: 3 mL via RESPIRATORY_TRACT
  Filled 2021-04-25: qty 3

## 2021-04-25 MED ORDER — MONTELUKAST SODIUM 10 MG PO TABS
10.0000 mg | ORAL_TABLET | Freq: Every day | ORAL | Status: DC
  Administered 2021-04-25 – 2021-04-27 (×3): 10 mg via ORAL
  Filled 2021-04-25 (×4): qty 1

## 2021-04-25 MED ORDER — POTASSIUM CHLORIDE CRYS ER 20 MEQ PO TBCR: 20.0000 meq | EXTENDED_RELEASE_TABLET | Freq: Two times a day (BID) | ORAL | Status: DC

## 2021-04-25 MED ORDER — FUROSEMIDE 20 MG PO TABS
40.0000 mg | ORAL_TABLET | Freq: Two times a day (BID) | ORAL | Status: DC
  Administered 2021-04-25: 40 mg via ORAL
  Filled 2021-04-25: qty 2

## 2021-04-25 MED ORDER — LACTATED RINGERS IV SOLN
INTRAVENOUS | Status: AC
Start: 2021-04-25 — End: 2021-04-25

## 2021-04-25 MED ORDER — ACETAMINOPHEN 325 MG PO TABS
650.0000 mg | ORAL_TABLET | Freq: Four times a day (QID) | ORAL | Status: DC | PRN
  Administered 2021-04-26: 650 mg via ORAL
  Filled 2021-04-25: qty 2

## 2021-04-25 MED ORDER — GABAPENTIN 100 MG PO CAPS
200.0000 mg | ORAL_CAPSULE | Freq: Two times a day (BID) | ORAL | Status: DC
  Administered 2021-04-25 – 2021-04-28 (×7): 200 mg via ORAL
  Filled 2021-04-25 (×7): qty 2

## 2021-04-25 MED ORDER — BUDESONIDE 0.5 MG/2ML IN SUSP
0.5000 mg | Freq: Two times a day (BID) | RESPIRATORY_TRACT | Status: DC
  Administered 2021-04-25 – 2021-04-28 (×7): 0.5 mg via RESPIRATORY_TRACT
  Filled 2021-04-25 (×7): qty 2

## 2021-04-25 MED ORDER — UMECLIDINIUM BROMIDE 62.5 MCG/ACT IN AEPB
1.0000 | INHALATION_SPRAY | Freq: Every day | RESPIRATORY_TRACT | Status: DC
Start: 2021-04-25 — End: 2021-04-28
  Administered 2021-04-26 – 2021-04-28 (×3): 1 via RESPIRATORY_TRACT
  Filled 2021-04-25: qty 7

## 2021-04-25 MED ORDER — AMIODARONE HCL 200 MG PO TABS
400.0000 mg | ORAL_TABLET | Freq: Two times a day (BID) | ORAL | Status: DC
  Administered 2021-04-25 – 2021-04-28 (×7): 400 mg via ORAL
  Filled 2021-04-25 (×7): qty 2

## 2021-04-25 MED ORDER — SODIUM CHLORIDE 0.9 % IV SOLN
500.0000 mg | Freq: Once | INTRAVENOUS | Status: AC
  Administered 2021-04-25: 500 mg via INTRAVENOUS
  Filled 2021-04-25: qty 5

## 2021-04-25 MED ORDER — LEVALBUTEROL HCL 0.63 MG/3ML IN NEBU: 0.6300 mg | INHALATION_SOLUTION | Freq: Four times a day (QID) | RESPIRATORY_TRACT | Status: DC | PRN

## 2021-04-25 MED ORDER — OMEGA-3-ACID ETHYL ESTERS 1 G PO CAPS
1.0000 g | ORAL_CAPSULE | Freq: Two times a day (BID) | ORAL | Status: DC
  Administered 2021-04-25 – 2021-04-28 (×7): 1 g via ORAL
  Filled 2021-04-25 (×8): qty 1

## 2021-04-25 MED ORDER — PROPYLENE GLYCOL 0.6 % OP SOLN: 1.0000 [drp] | OPHTHALMIC | Status: DC

## 2021-04-25 MED ORDER — POTASSIUM CHLORIDE CRYS ER 20 MEQ PO TBCR
20.0000 meq | EXTENDED_RELEASE_TABLET | Freq: Two times a day (BID) | ORAL | Status: DC
  Administered 2021-04-25: 20 meq via ORAL
  Filled 2021-04-25: qty 1

## 2021-04-25 MED ORDER — POLYVINYL ALCOHOL 1.4 % OP SOLN: 1.0000 [drp] | OPHTHALMIC | Status: DC | PRN

## 2021-04-25 MED ORDER — APIXABAN 5 MG PO TABS
5.0000 mg | ORAL_TABLET | Freq: Two times a day (BID) | ORAL | Status: DC
  Administered 2021-04-25 – 2021-04-28 (×7): 5 mg via ORAL
  Filled 2021-04-25 (×7): qty 1

## 2021-04-25 MED ORDER — ASPIRIN 81 MG PO CHEW
81.0000 mg | CHEWABLE_TABLET | Freq: Every day | ORAL | Status: DC
Start: 2021-04-25 — End: 2021-04-28
  Administered 2021-04-25 – 2021-04-28 (×4): 81 mg via ORAL
  Filled 2021-04-25 (×4): qty 1

## 2021-04-25 MED ORDER — ISOSORBIDE MONONITRATE ER 30 MG PO TB24
30.0000 mg | ORAL_TABLET | Freq: Every day | ORAL | Status: DC
  Administered 2021-04-25: 30 mg via ORAL
  Filled 2021-04-25: qty 1

## 2021-04-25 MED ORDER — SODIUM CHLORIDE 0.9 % IV SOLN
3.0000 g | Freq: Three times a day (TID) | INTRAVENOUS | Status: DC
  Administered 2021-04-25 – 2021-04-28 (×9): 3 g via INTRAVENOUS
  Filled 2021-04-25 (×11): qty 8

## 2021-04-25 MED ORDER — CEFTRIAXONE SODIUM 2 G IJ SOLR: 2.0000 g | INTRAMUSCULAR | Status: DC

## 2021-04-25 MED ORDER — ROSUVASTATIN CALCIUM 20 MG PO TABS
20.0000 mg | ORAL_TABLET | Freq: Every day | ORAL | Status: DC
  Administered 2021-04-25 – 2021-04-27 (×3): 20 mg via ORAL
  Filled 2021-04-25 (×3): qty 1

## 2021-04-25 MED ORDER — MIDODRINE HCL 5 MG PO TABS
5.0000 mg | ORAL_TABLET | Freq: Three times a day (TID) | ORAL | Status: DC
  Administered 2021-04-25 – 2021-04-28 (×10): 5 mg via ORAL
  Filled 2021-04-25 (×10): qty 1

## 2021-04-25 MED ORDER — LEVOTHYROXINE SODIUM 75 MCG PO TABS
75.0000 ug | ORAL_TABLET | Freq: Every day | ORAL | Status: DC
  Administered 2021-04-25 – 2021-04-28 (×4): 75 ug via ORAL
  Filled 2021-04-25 (×4): qty 1

## 2021-04-25 MED ORDER — SODIUM CHLORIDE 0.9 % IV SOLN
1.0000 g | Freq: Once | INTRAVENOUS | Status: AC
  Administered 2021-04-25: 1 g via INTRAVENOUS
  Filled 2021-04-25: qty 10

## 2021-04-25 MED ORDER — FUROSEMIDE 40 MG PO TABS: 40.0000 mg | ORAL_TABLET | Freq: Two times a day (BID) | ORAL | Status: DC

## 2021-04-25 NOTE — ED Triage Notes (Signed)
Pt BIB [facility] for respiratory distress. O2 sats 73% on 4L [place]. Pt typically on 4L [place]. EMS placed pt on CPAP and O2 improved to 88%. 5 of albuterol, 0.3 epi, 125 solumedrol, and 2 of mag given by EMS. Pt A&Ox4 ? ?Hx- CHF ?

## 2021-04-25 NOTE — Progress Notes (Signed)
Pharmacy Antibiotic Note ? ?Max Rivas is a 71 y.o. male admitted on 04/25/2021 presenting with SOB and rigors.  Pharmacy has been consulted for Unasyn dosing. ? ?Plan: ?Unasyn 3g IV q 8 hours ?Monitor renal function, clinical progression and LOT ? ?Height: 5\' 10"  (177.8 cm) ?Weight: 99.8 kg (220 lb) ?IBW/kg (Calculated) : 73 ? ?Temp (24hrs), Avg:100.4 ?F (38 ?C), Min:98.9 ?F (37.2 ?C), Max:102.9 ?F (39.4 ?C) ? ?Recent Labs  ?Lab 04/25/21 ?0154 04/25/21 ?0211 04/25/21 ?04/27/21 04/25/21 ?04/27/21 04/25/21 ?1043  ?WBC 13.4*  --   --  14.5*  --   ?CREATININE 1.51* 1.40* 1.68*  --   --   ?LATICACIDVEN 1.9  --   --   --  2.5*  ?  ?Estimated Creatinine Clearance: 49.1 mL/min (A) (by C-G formula based on SCr of 1.68 mg/dL (H)).   ? ?Allergies  ?Allergen Reactions  ? Crestor [Rosuvastatin Calcium] Other (See Comments)  ?  Back spasms, initially, but can tolerate it at a low dose now, in 2022  ? Rosuvastatin Other (See Comments)  ?  Back spasms, initially, but can tolerate it at a low dose now, in 2022  ? Albuterol Other (See Comments)  ?  IRREGULAR HEART RATE  ? Atorvastatin Other (See Comments)  ?  Spasticity  ? ? ?2023, PharmD ?Clinical Pharmacist ?ED Pharmacist Phone # [phone] ?04/25/2021 1:09 PM ? ?

## 2021-04-25 NOTE — ED Notes (Signed)
Pt was incontinent of large solid BM sometime overnight, RN and NT provided peri care, new sheets and repositioning. Call light in reach. Pt stable on 12LPM per HFNC. Denies needs, distress noted. ?

## 2021-04-25 NOTE — H&P (Signed)
?History and Physical  ? ? ?Max Rivas [id number] DOB: 11/24/1950 DOA: 04/25/2021 ? ?PCP: [name], MD  ?Patient coming from: Home. ? ?Chief Complaint: Shortness of breath and rigors. ? ?HPI: Max Rivas is a 71 y.o. male with known history of CAD s/p CABG and left atrial appendage clipping, history of recurrent VT s/p ablation and ICD, chronic systolic heart failure last EF measured was 30 to 35% in March 2022, paroxysmal atrial fibrillation, COPD presents to the ER after patient started having shortness of breath with the rigors since yesterday morning.  Has been having some cough not productive of sputum.  Denies any recent travel or sick contacts.  Denies any chest pain. ? ?ED Course: In the ER patient was febrile with temperature 102 ?F labs showed leukocytosis BNP of 580 high sensitive troponin of 99 lactic acid was normal.  Initial blood pressure was in the low normal.  Chest x-ray did not show any acute but given the fever and cough concerning features for developing sepsis likely respiratory was started on antibiotics but admitted for further observation.  UA was unremarkable.  COVID test was negative. ? ?Review of Systems: As per HPI, rest all negative. ? ? ?Past Medical History:  ?Diagnosis Date  ? Arthritis   ? "elbows, knees" (02/24/2016)  ? Bronchitis 2006  ? CAD (coronary artery disease)   ? a. s/p prior MIs - 1995 x 2, 1998; b. s/p prior LCX stenting; c. 04/2019 Cath: LM min irregs, LAD 65p/mi, D1 75, LCX 99ost/p, 65m (ISR), OM2 80, RCA/RPDA mod diff dzs; d. 05/2019 CABG x 3: LIMA->LAD, VG->Diag, VG->OM.  ? COPD (chronic obstructive pulmonary disease) ([place])   ? a. Remote tobacco-->on home O2.  ? GERD (gastroesophageal reflux disease)   ? HFmrEF (heart failure with mid-range ejection fraction) ([place])   ? a. 05/2019 Echo: EF 40-45%, gr2 DD. Nl RV size/fxn. Mildly dil LA. Mild MR.  ? High cholesterol   ? Ischemic cardiomyopathy   ? a. 05/2019 Echo: EF 40-45%.  ? Morbid obesity ([place])   ?  Myocardial infarction [facility]) ~ 1995 X 2;1998; 2000; 2004  ? On home oxygen therapy   ? "2L w/activity" (02/24/2016)  ? PAF (paroxysmal atrial fibrillation) ([place])   ? a. CHA2DS2VASc = 4-->eliquis. Also on amio.  ? Pulmonary embolism ([place]) 02/24/2016  ? Sleep apnea   ? "have CPAP; can't tolerate it" (02/24/2016)  ? Ventricular tachycardia ([place])   ? a. 2005 s/p ICD; b. 03/2011 Device upgrade/lead exchange: MDT Protecta XT VR single lead AICD.  ? ? ?Past Surgical History:  ?Procedure Laterality Date  ? CARDIOVERSION N/A 09/11/2019  ? Procedure: CARDIOVERSION;  Surgeon: Buford Dresser, MD;  [facility] END[facility]COPY;  Service: Cardiovascular;  Laterality: N/A;  ? CLIPPING OF ATRIAL APPENDAGE N/A 05/23/2019  ? Procedure: CLIPPING OF ATRIAL APPENDAGE with atriclip;  Surgeon: Gaye Pollack, MD;  Location: [facility];  Service: Open Heart Surgery;  Laterality: N/A;  ? CORONARY ANGIOPLASTY  1995  ? CORONARY ANGIOPLASTY WITH STENT PLACEMENT  ~ 1995 - 2004 X 5  ? "I've got a total of 5 stents" (02/24/2016)  ? CORONARY ARTERY BYPASS GRAFT N/A 05/23/2019  ? Procedure: CORONARY ARTERY BYPASS GRAFTING (CABG), times three, using right greater saphenous vein and left internal mammary;  Surgeon: Gaye Pollack, MD;  Location: [place] OR;  Service: Open Heart Surgery;  Laterality: N/A;  [facility] only  ? INSERT / REPLACE / REMOVE PACEMAKER  07/2003  ? original PPM around 2004 for  ICM with EF < 35%  ? INTRAVASCULAR PRESSURE WIRE/FFR STUDY N/A 05/12/2019  ? Procedure: INTRAVASCULAR PRESSURE WIRE/FFR STUDY;  Surgeon: Belva Crome, MD;  Location: [place] CV LAB;  Service: Cardiovascular;  Laterality: N/A;  ? LEFT HEART CATH AND CORONARY ANGIOGRAPHY N/A 05/12/2019  ? Procedure: LEFT HEART CATH AND CORONARY ANGIOGRAPHY;  Surgeon: Belva Crome, MD;  Location: [place] CV LAB;  Service: Cardiovascular;  Laterality: N/A;  ? PACEMAKER GENERATOR CHANGE  03/2011  ?  [place]  ? TEE WITHOUT CARDIOVERSION N/A 05/23/2019  ? Procedure: TRANSESOPHAGEAL  ECHOCARDIOGRAM (TEE);  Surgeon: Gaye Pollack, MD;  Location: [facility];  Service: Open Heart Surgery;  Laterality: N/A;  ? TONSILLECTOMY    ? ? ? reports that he quit smoking about 23 months ago. His smoking use included cigarettes. He has a 105.00 pack-year smoking history. He quit smokeless tobacco use about 3 years ago. He reports that he does not currently use alcohol. He reports that he does not currently use drugs after having used the following drugs: Cocaine. ? ?Allergies  ?Allergen Reactions  ? Crestor [Rosuvastatin Calcium] Other (See Comments)  ?  Back spasms, initially, but can tolerate it at a low dose now, in 2022  ? Rosuvastatin Other (See Comments)  ?  Back spasms, initially, but can tolerate it at a low dose now, in 2022  ? Albuterol Other (See Comments)  ?  IRREGULAR HEART RATE  ? Atorvastatin Other (See Comments)  ?  Spasticity  ? ? ?Family History  ?Problem Relation Age of Onset  ? Heart attack Mother   ? Heart attack Father   ? Heart attack Sister 20  ? ? ?Prior to Admission medications   ?Medication Sig Start Date End Date Taking? Authorizing Provider  ?acetaminophen (TYLENOL) 500 MG tablet Take 500-1,000 mg by mouth every 6 (six) hours as needed for moderate pain, headache or mild pain.   Yes [provider]  ?amiodarone (PACERONE) 400 MG tablet Take 1 tablet (400 mg total) by mouth 2 (two) times daily. 03/19/20  Yes Shirley Friar, PA-C  ?apixaban (ELIQUIS) 5 MG TABS tablet Take 5 mg by mouth 2 (two) times daily.   Yes [provider]  ?aspirin 81 MG chewable tablet Chew 1 tablet (81 mg total) by mouth daily. 05/16/19  Yes Kroeger, Lorelee Cover., PA-C  ?Cholecalciferol (VITAMIN D3) 50 MCG (2000 UT) TABS Take 2,000 Units by mouth daily.   Yes [provider]  ?furosemide (LASIX) 40 MG tablet Take 40 mg by mouth 2 (two) times daily.    Yes [provider]  ?gabapentin (NEURONTIN) 100 MG capsule Take 2 capsules (200 mg total) by mouth 2 (two) times daily.  06/01/19  Yes Barrett, Erin R, PA-C  ?isosorbide mononitrate (IMDUR) 30 MG 24 hr tablet Take 1 tablet (30 mg total) by mouth daily. 03/13/21  Yes Margarita Mail, PA-C  ?levalbuterol [facility] HFA) 45 MCG/ACT inhaler Inhale 2 puffs into the lungs every 6 (six) hours as needed for wheezing or shortness of breath.   Yes [provider]  ?levothyroxine (SYNTHROID) 75 MCG tablet Take 75 mcg by mouth daily before breakfast.   Yes [provider]  ?metoprolol succinate (TOPROL-XL) 25 MG 24 hr tablet Take 12.5 mg by mouth in the morning.   Yes [provider]  ?mexiletine (MEXITIL) 150 MG capsule Take 2 capsules (300 mg total) by mouth 2 (two) times daily. ?Patient taking differently: Take 150 mg by mouth 2 (two) times  daily. 12/07/19  Yes Kathyrn Drown D, NP  ?mometasone [facility]) 220 MCG/INH inhaler Inhale 2 puffs into the lungs 2 (two) times daily.   Yes [provider]  ?montelukast (SINGULAIR) 10 MG tablet Take 10 mg by mouth at bedtime.   Yes [provider]  ?Multiple Vitamin (MULTIVITAMIN WITH MINERALS) TABS tablet Take 1 tablet by mouth daily with breakfast.   Yes [provider]  ?nitroGLYCERIN (NITROSTAT) 0.4 MG SL tablet Place 0.4 mg under the tongue every 5 (five) minutes as needed for chest pain.    Yes [provider]  ?Omega-3 Fatty Acids (FISH OIL) 1000 MG CAPS Take 1,000 mg by mouth in the morning and at bedtime.   Yes [provider]  ?omeprazole (PRILOSEC) 20 MG capsule Take 20 mg by mouth in the morning and at bedtime.   Yes [provider]  ?OXYGEN Inhale 4 L into the lungs continuous.   Yes [provider]  ?potassium chloride SA (KLOR-CON) 20 MEQ tablet Take 1 tablet (20 mEq total) by mouth 2 (two) times daily. 03/19/20  Yes Shirley Friar, PA-C  ?Propylene Glycol (SYSTANE BALANCE) 0.6 % SOLN Place 1 drop into both eyes See admin instructions. Place 1 drop into each eye four times a day and apply a warm  compress for 10 minutes afterwards    Yes [provider]  ?rosuvastatin (CRESTOR) 40 MG tablet Take 20 mg by mouth at bedtime.   Yes [provider]  ?Tiotropium Bromide-Olodaterol 2.5-2.5 MCG/ACT

## 2021-04-25 NOTE — ED Provider Notes (Signed)
?[name] EMERGENCY DEPARTMENT ?Provider Note ? ? ?CSN: [id number] ?Arrival date & time: 04/25/21  0142 ? ?  ? ?History ? ?Chief Complaint  ?Patient presents with  ? Respiratory Distress  ? ? ?Max Rivas is a 71 y.o. male. ? ?The history is provided by the patient, the EMS personnel and medical records.  ?Max Rivas is a 71 y.o. male who presents to the Emergency Department complaining of respiratory distress.  Level 5 caveat due to acuity of condition.  He presents to the emergency department by EMS for respiratory distress that started a few hours prior to them being called.  He has a history of CHF, COPD he is on 4 L oxygen at baseline.  He is currently staying in a hotel room due to his home being fumigated.  He developed sudden onset shortness of breath.  EMS gave him epi, Solu-Medrol, mag and albuterol prior to ED presentation.  They report sats of 70% on his home oxygen.  He was started on CPAP.  Patient reports that his breathing is partially improved with the interventions.  He does report fevers that started abruptly this evening.  No chest pain, abdominal pain.  No recent medication changes. ?  ? ?Home Medications ?Prior to Admission medications   ?Medication Sig Start Date End Date Taking? Authorizing Provider  ?acetaminophen (TYLENOL) 500 MG tablet Take 500-1,000 mg by mouth every 6 (six) hours as needed for moderate pain, headache or mild pain.   Yes [provider]  ?amiodarone (PACERONE) 400 MG tablet Take 1 tablet (400 mg total) by mouth 2 (two) times daily. 03/19/20  Yes Graciella Freer, PA-C  ?apixaban (ELIQUIS) 5 MG TABS tablet Take 5 mg by mouth 2 (two) times daily.   Yes [provider]  ?aspirin 81 MG chewable tablet Chew 1 tablet (81 mg total) by mouth daily. 05/16/19  Yes Kroeger, Ovidio Kin., PA-C  ?Cholecalciferol (VITAMIN D3) 50 MCG (2000 UT) TABS Take 2,000 Units by mouth daily.   Yes [provider]  ?furosemide (LASIX) 40 MG  tablet Take 40 mg by mouth 2 (two) times daily.    Yes [provider]  ?gabapentin (NEURONTIN) 100 MG capsule Take 2 capsules (200 mg total) by mouth 2 (two) times daily. 06/01/19  Yes Barrett, Erin R, PA-C  ?isosorbide mononitrate (IMDUR) 30 MG 24 hr tablet Take 1 tablet (30 mg total) by mouth daily. 03/13/21  Yes Arthor Captain, PA-C  ?levalbuterol [facility] HFA) 45 MCG/ACT inhaler Inhale 2 puffs into the lungs every 6 (six) hours as needed for wheezing or shortness of breath.   Yes [provider]  ?levothyroxine (SYNTHROID) 75 MCG tablet Take 75 mcg by mouth daily before breakfast.   Yes [provider]  ?metoprolol succinate (TOPROL-XL) 25 MG 24 hr tablet Take 12.5 mg by mouth in the morning.   Yes [provider]  ?mexiletine (MEXITIL) 150 MG capsule Take 2 capsules (300 mg total) by mouth 2 (two) times daily. ?Patient taking differently: Take 150 mg by mouth 2 (two) times daily. 12/07/19  Yes Georgie Chard D, NP  ?mometasone [facility]) 220 MCG/INH inhaler Inhale 2 puffs into the lungs 2 (two) times daily.   Yes [provider]  ?montelukast (SINGULAIR) 10 MG tablet Take 10 mg by mouth at bedtime.   Yes [provider]  ?Multiple Vitamin (MULTIVITAMIN WITH MINERALS) TABS tablet Take 1 tablet by mouth daily with breakfast.   Yes [provider]  ?nitroGLYCERIN (NITROSTAT)  0.4 MG SL tablet Place 0.4 mg under the tongue every 5 (five) minutes as needed for chest pain.    Yes [provider]  ?Omega-3 Fatty Acids (FISH OIL) 1000 MG CAPS Take 1,000 mg by mouth in the morning and at bedtime.   Yes [provider]  ?omeprazole (PRILOSEC) 20 MG capsule Take 20 mg by mouth in the morning and at bedtime.   Yes [provider]  ?OXYGEN Inhale 4 L into the lungs continuous.   Yes [provider]  ?potassium chloride SA (KLOR-CON) 20 MEQ tablet Take 1 tablet (20 mEq total) by mouth 2 (two) times daily. 03/19/20  Yes Graciella Freer, PA-C  ?Propylene Glycol (SYSTANE BALANCE) 0.6 % SOLN Place 1 drop into both eyes See admin instructions. Place 1 drop into each eye four times a day and apply a warm compress for 10 minutes afterwards    Yes [provider]  ?rosuvastatin (CRESTOR) 40 MG tablet Take 20 mg by mouth at bedtime.   Yes [provider]  ?Tiotropium Bromide-Olodaterol 2.5-2.5 MCG/ACT AERS Inhale 1 puff into the lungs in the morning and at bedtime.   Yes [provider]  ?acetaminophen (TYLENOL) 325 MG tablet Take 2 tablets (650 mg total) by mouth every 6 (six) hours as needed for mild pain (or Fever >/= 101). ?Patient not taking: Reported on 03/18/2020 03/05/16   Alison Murray, MD  ?atorvastatin (LIPITOR) 80 MG tablet Take 1 tablet (80 mg total) by mouth daily. ?Patient not taking: Reported on 03/18/2020 05/15/19   Beatriz Stallion., PA-C  ?omega-3 acid ethyl esters (LOVAZA) 1 g capsule Take 1 capsule (1 g total) by mouth 2 (two) times daily. ?Patient not taking: Reported on 03/18/2020 03/05/16   Alison Murray, MD  ?   ? ?Allergies    ?Crestor [rosuvastatin calcium], Rosuvastatin, Albuterol, and Atorvastatin   ? ?Review of Systems   ?Review of Systems  ?All other systems reviewed and are negative. ? ?Physical Exam ?Updated Vital Signs ?BP 107/65   Pulse 82   Temp (!) 102.9 ?F (39.4 ?C) (Rectal)   Resp (!) 23   Ht 5\' 10"  (1.778 m)   Wt 99.8 kg   SpO2 94%   BMI 31.57 kg/m?  ?Physical Exam ?Vitals and nursing note reviewed.  ?Constitutional:   ?   Appearance: He is well-developed.  ?   Comments: Drowsy but conversant  ?HENT:  ?   Head: Normocephalic and atraumatic.  ?Cardiovascular:  ?   Rate and Rhythm: Normal rate and regular rhythm.  ?   Heart sounds: No murmur heard. ?Pulmonary:  ?   Effort: Pulmonary effort is normal. No respiratory distress.  ?   Comments: Tachypnea.  Decreased air movement in the right lung fields with crackles in the right l midlung.  Fair air movement in the left lung  fields ?Abdominal:  ?   Palpations: Abdomen is soft.  ?   Tenderness: There is no abdominal tenderness. There is no guarding or rebound.  ?   Comments: Umbilical hernia that is protruding but able to be reduced with firm steady pressure  ?Musculoskeletal:     ?   General: No tenderness.  ?Skin: ?   General: Skin is warm and dry.  ?Neurological:  ?   Mental Status: He is oriented to person, place, and time.  ?   Comments: Generalized weakness  ? ? ?ED Results / Procedures / Treatments   ?Labs ?(all labs ordered are listed,  but only abnormal results are displayed) ?Labs Reviewed  ?COMPREHENSIVE METABOLIC PANEL - Abnormal; Notable for the following components:  ?    Result Value  ? Potassium 3.2 (*)   ? Glucose, Bld 156 (*)   ? Creatinine, Ser 1.51 (*)   ? Calcium 8.6 (*)   ? Total Protein 6.4 (*)   ? Total Bilirubin 1.3 (*)   ? GFR, Estimated 50 (*)   ? All other components within normal limits  ?CBC WITH DIFFERENTIAL/PLATELET - Abnormal; Notable for the following components:  ? WBC 13.4 (*)   ? Neutro Abs 11.4 (*)   ? Monocytes Absolute 1.2 (*)   ? Abs Immature Granulocytes 0.08 (*)   ? All other components within normal limits  ?PROTIME-INR - Abnormal; Notable for the following components:  ? Prothrombin Time 16.6 (*)   ? INR 1.4 (*)   ? All other components within normal limits  ?URINALYSIS, ROUTINE W REFLEX MICROSCOPIC - Abnormal; Notable for the following components:  ? APPearance HAZY (*)   ? Ketones, ur 5 (*)   ? All other components within normal limits  ?BRAIN NATRIURETIC PEPTIDE - Abnormal; Notable for the following components:  ? B Natriuretic Peptide 580.4 (*)   ? All other components within normal limits  ?I-STAT CHEM 8, ED - Abnormal; Notable for the following components:  ? Potassium 3.2 (*)   ? Creatinine, Ser 1.40 (*)   ? Glucose, Bld 153 (*)   ? Calcium, Ion 1.00 (*)   ? All other components within normal limits  ?I-STAT VENOUS BLOOD GAS, ED - Abnormal; Notable for the following components:  ? pH,  Ven 7.519 (*)   ? pCO2, Ven 30.3 (*)   ? pO2, Ven 48 (*)   ? Acid-Base Excess 3.0 (*)   ? Potassium 3.2 (*)   ? Calcium, Ion 1.00 (*)   ? All other components within normal limits  ?TROPONIN I (HIGH SENSITIVITY) - Abnormal;

## 2021-04-25 NOTE — Sepsis Progress Note (Signed)
Following per sepsis protocol   

## 2021-04-25 NOTE — ED Notes (Signed)
No fluid boluses administered for sepsis protocol d/t pt's history of CHF and EDP not wanting to fluid overload pt.  ?

## 2021-04-25 NOTE — Progress Notes (Signed)
Patient arrived to 4E from ED. Patient VSS. Telemetry box applied, CCMD notified. Patient oriented to room and staff. Call bell in reach. ? ?Kenard Gower, RN  ?

## 2021-04-25 NOTE — ED Notes (Signed)
One set of blood cultures already sent, antibiotics started d/t not wanting to delay treatment. ?

## 2021-04-25 NOTE — Progress Notes (Addendum)
?                                  PROGRESS NOTE                                             ?                                                                                                                     ?                                         ? ? Patient Demographics:  ? ? Max Rivas, is a 71 y.o. male, DOB - 1950/03/13, [id number] ? ?Outpatient Primary MD for the patient is Max Mellow, MD    LOS - 0  Admit date - 04/25/2021   ? ?Chief Complaint  ?Patient presents with  ? Respiratory Distress  ?    ? ?Brief Narrative (HPI from H&P)  71 y.o. male with known history of CAD s/p CABG and left atrial appendage clipping, history of recurrent VT s/p ablation and ICD, chronic systolic heart failure last EF measured was 30 to 35% in March 2022, paroxysmal atrial fibrillation, COPD presents to the ER after patient started having shortness of breath with the rigors since yesterday morning.  Has been having some cough not productive of sputum.  His work-up was suggestive of acute on chronic hypoxic respiratory failure he requires 4 L of nasal cannula oxygen at baseline and was placed on 12 L to maintain a pulse ox of over 90%, he also had sepsis related to pneumonia and admitted for further care. ? ? Subjective:  ? ? [place] today has, No headache, No chest pain, No abdominal pain - No Nausea, No new weakness tingling or numbness, positive cough with improved shortness of breath. ? ? Assessment  & Plan :  ? ? ?Sepsis due to pneumonia.  Suspicious for aspiration pneumonia.  Continue gentle IV fluids for hydration, sepsis pathophysiology seems to be much improved and almost resolved, have adjusted antibiotics to IV Unasyn and oral Levaquin, aspiration precautions, speech to evaluate.  Continue to monitor closely. ? ?2.  Acute on chronic hypoxic respiratory failure.  At baseline uses 4 L nasal cannula oxygen, on 4 L he was saturating below 85% requiring to be placed on 12  L nasal cannula oxygen at time of admission, currently titrated down to 8 L.  We will continue to monitor.  Continue supportive care with nebulizer treatments, encouraged to sit up in chair use flutter valve and I-S for pulmonary toiletry.  Will gradually advance activity and titrate down oxygen.  Added low-dose midodrine for now ? ?3.  Non-ACS pattern elevation in troponin.  Due to demand mismatch from sepsis, trend is flat, continue beta-blocker as tolerated by blood pressure, statin along with Eliquis for secondary prevention. ? ?4.  History of CAD s/p CABG as in #3 above. ? ?5.  Paroxysmal A-fib, history of V. tach s/p ablation and AICD placement in the past.  Mali vas 2 score of greater than 3.  On combination of amiodarone and Eliquis which will be continued, telemetry monitor, monitor electrolytes closely, low-dose beta-blocker if blood pressure is stable. ? ?6.  Chronic systolic heart failure EF around 35% currently compensated, low-dose beta-blocker, Imdur and Lasix if tolerated by blood pressure.  No ACE/ARB/Entresto due to underlying CKD. ? ?7.  Dyslipidemia.  On statin. ? ?8.  CKD 3B.  Baseline creatinine appears to be close to 1.5.  Monitor. ? ?9.  Chronic hypoxic respiratory failure secondary to underlying COPD .  No wheezing.  Monitor.  Supportive care with nebulizer treatments.  Oxygen as needed. ? ?10.  Hypothyroidism.  TSH of 1.4.  Continue home dose Synthroid. ? ?11.  History of OSA.  Does not wear CPAP at home.  Nighttime and daytime oxygen to continue. ? ?   ? ?Condition - Guarded ? ?Family Communication  :  none present ? ?Code Status :  Full ? ?Consults  :  None ? ?PUD Prophylaxis : PPI ? ? Procedures  :    ? ?CT chest abdomen pelvis noncontrast - 1. Right lower lung airspace and ground-glass opacities, progressive since 07/25/2018. 2. Cholelithiasis 3. Aortic Atherosclerosis (ICD10-I70.0) and Emphysema  ? ?   ? ?Disposition Plan  :   ? ?Status is: Inpatient ? ? ?DVT Prophylaxis  :    ? ? ?apixaban (ELIQUIS) tablet 5 mg  ?  ? ?Lab Results  ?Component Value Date  ? PLT 131 (L) 04/25/2021  ? ? ?Diet :  ?Diet Order   ? ?       ?  Diet Heart Room service appropriate? Yes; Fluid consistency: Thin; Fluid restriction: 1200 mL Fluid  Diet effective now       ?  ? ?  ?  ? ?  ?  ? ?Inpatient Medications ? ?Scheduled Meds: ? amiodarone  400 mg Oral BID  ? apixaban  5 mg Oral BID  ? arformoterol  15 mcg Nebulization BID  ? And  ? umeclidinium bromide  1 puff Inhalation Daily  ? aspirin  81 mg Oral Daily  ? budesonide  0.5 mg Inhalation BID  ? [START ON 04/26/2021] furosemide  40 mg Oral BID  ? gabapentin  200 mg Oral BID  ? isosorbide mononitrate  30 mg Oral Daily  ? levofloxacin  500 mg Oral Daily  ? levothyroxine  75 mcg Oral Q0600  ? metoprolol succinate  12.5 mg Oral q AM  ? mexiletine  150 mg Oral BID  ? midodrine  5 mg Oral TID WC  ? montelukast  10 mg Oral QHS  ? omega-3 acid ethyl esters  1 g Oral BID  ? pantoprazole  40 mg Oral Daily  ? [START ON 04/26/2021] potassium chloride SA  20 mEq Oral BID  ? rosuvastatin  20 mg Oral QHS  ? ?Continuous Infusions: ? lactated ringers    ? ?PRN Meds:.acetaminophen **OR** acetaminophen, levalbuterol, nitroGLYCERIN, polyvinyl alcohol ? ?Time Spent in minutes  30 ? ? ?Lala Lund M.D on 04/25/2021 at 12:59 PM ? ?To page go to [email or web address]  ? ?[facility] -  Office  [phone] ? ?  See all Orders from today for further details ? ? ? Objective:  ? ?Vitals:  ? 04/25/21 0900 04/25/21 1000 04/25/21 1100 04/25/21 1200  ?BP: (!) 107/55 (!) 99/52 (!) 105/56 113/60  ?Pulse: 70 65 61 63  ?Resp: 20 19 15 20   ?Temp:    98.9 ?F (37.2 ?C)  ?TempSrc:      ?SpO2: 98% 97% 93% 96%  ?Weight:      ?Height:      ? ? ?Wt Readings from Last 3 Encounters:  ?04/25/21 99.8 kg  ?03/13/21 106.6 kg  ?01/03/21 98.4 kg  ? ? ? ?Intake/Output Summary (Last 24 hours) at 04/25/2021 1259 ?Last data filed at 04/25/2021 [id number] ?Gross per 24 hour  ?Intake 598.12 ml  ?Output 250 ml  ?Net 348.12 ml   ? ? ? ?Physical Exam ? ?Awake Alert, No new F.N deficits, Normal affect ?[place].AT,PERRAL ?Supple Neck, No JVD,   ?Symmetrical Chest wall movement, Good air movement bilaterally, CTAB ?RRR,No Gallops,Rubs or new Murmurs,  ?+ve B.Sounds, Abd Soft, No tenderness,   ?No Cyanosis, Clubbing or edema  ?  ? ?RN pressure injury documentation: ?Pressure Injury 05/26/19 Buttocks Right Deep Tissue Pressure Injury - Purple or maroon localized area of discolored intact skin or blood-filled blister due to damage of underlying soft tissue from pressure and/or shear. (Active)  ?05/26/19 1200  ?Location: Buttocks  ?Location Orientation: Right  ?Staging: Deep Tissue Pressure Injury - Purple or maroon localized area of discolored intact skin or blood-filled blister due to damage of underlying soft tissue from pressure and/or shear.  ?Wound Description (Comments):   ?Present on Admission:   ? ? ? Data Review:  ? ? ?CBC ?Recent Labs  ?Lab 04/25/21 ?0154 04/25/21 ?0206 04/25/21 ?0211 04/25/21 ?0527  ?WBC 13.4*  --   --  14.5*  ?HGB 15.1 15.0 15.3 13.8  ?HCT 44.6 44.0 45.0 41.1  ?PLT 152  --   --  131*  ?MCV 91.4  --   --  91.7  ?MCH 30.9  --   --  30.8  ?MCHC 33.9  --   --  33.6  ?RDW 14.7  --   --  14.6  ?LYMPHSABS 0.7  --   --  0.5*  ?MONOABS 1.2*  --   --  0.9  ?EOSABS 0.0  --   --  0.4  ?BASOSABS 0.0  --   --  0.0  ? ? ?Electrolytes ?Recent Labs  ?Lab 04/25/21 ?0154 04/25/21 ?0206 04/25/21 ?0211 04/25/21 ?[id number] 04/25/21 ?[id number] 04/25/21 ?1043 04/25/21 ?1044  ?NA 136 137 137 137  --   --   --   ?K 3.2* 3.2* 3.2* 3.3*  --   --   --   ?CL 102  --  101 102  --   --   --   ?CO2 22  --   --  24  --   --   --   ?GLUCOSE 156*  --  153* 153*  --   --   --   ?BUN 14  --  16 14  --   --   --   ?CREATININE 1.51*  --  1.40* 1.68*  --   --   --   ?CALCIUM 8.6*  --   --  8.4*  --   --   --   ?AST 40  --   --  52*  --   --   --   ?ALT 31  --   --  39  --   --   --   ?  ALKPHOS 65  --   --  57  --   --   --   ?BILITOT 1.3*  --   --  1.5*  --   --   --    ?ALBUMIN 3.6  --   --  3.3*  --   --   --   ?MG  --   --   --  2.2  --   --   --   ?CRP  --   --   --   --   --   --  11.6*  ?DDIMER  --   --   --   --   --   --  0.68*  ?PROCALCITON  --   --   --   --   --  28.65

## 2021-04-26 LAB — CORTISOL: Cortisol, Plasma: 6.2 ug/dL

## 2021-04-26 LAB — COMPREHENSIVE METABOLIC PANEL
ALT: 63 U/L — ABNORMAL HIGH (ref 0–44)
AST: 77 U/L — ABNORMAL HIGH (ref 15–41)
Albumin: 2.7 g/dL — ABNORMAL LOW (ref 3.5–5.0)
Alkaline Phosphatase: 37 U/L — ABNORMAL LOW (ref 38–126)
Anion gap: 8 (ref 5–15)
BUN: 20 mg/dL (ref 8–23)
CO2: 26 mmol/L (ref 22–32)
Calcium: 8.4 mg/dL — ABNORMAL LOW (ref 8.9–10.3)
Chloride: 105 mmol/L (ref 98–111)
Creatinine, Ser: 1.44 mg/dL — ABNORMAL HIGH (ref 0.61–1.24)
GFR, Estimated: 53 mL/min — ABNORMAL LOW (ref >60)
Glucose, Bld: 126 mg/dL — ABNORMAL HIGH (ref 70–99)
Potassium: 4.3 mmol/L (ref 3.5–5.1)
Sodium: 139 mmol/L (ref 135–145)
Total Bilirubin: 0.8 mg/dL (ref 0.3–1.2)
Total Protein: 5.4 g/dL — ABNORMAL LOW (ref 6.5–8.1)

## 2021-04-26 LAB — CBC WITH DIFFERENTIAL/PLATELET
Abs Immature Granulocytes: 0.12 10*3/uL — ABNORMAL HIGH (ref 0.00–0.07)
Basophils Absolute: 0 10*3/uL (ref 0.0–0.1)
Basophils Relative: 0 %
Eosinophils Absolute: 0 10*3/uL (ref 0.0–0.5)
Eosinophils Relative: 0 %
HCT: 36.3 % — ABNORMAL LOW (ref 39.0–52.0)
Hemoglobin: 12.4 g/dL — ABNORMAL LOW (ref 13.0–17.0)
Immature Granulocytes: 1 %
Lymphocytes Relative: 5 %
Lymphs Abs: 0.7 10*3/uL (ref 0.7–4.0)
MCH: 31 pg (ref 26.0–34.0)
MCHC: 34.2 g/dL (ref 30.0–36.0)
MCV: 90.8 fL (ref 80.0–100.0)
Monocytes Absolute: 0.8 10*3/uL (ref 0.1–1.0)
Monocytes Relative: 5 %
Neutro Abs: 14.3 10*3/uL — ABNORMAL HIGH (ref 1.7–7.7)
Neutrophils Relative %: 89 %
Platelets: 125 10*3/uL — ABNORMAL LOW (ref 150–400)
RBC: 4 MIL/uL — ABNORMAL LOW (ref 4.22–5.81)
RDW: 15 % (ref 11.5–15.5)
WBC: 16 10*3/uL — ABNORMAL HIGH (ref 4.0–10.5)
nRBC: 0 % (ref 0.0–0.2)

## 2021-04-26 LAB — MAGNESIUM: Magnesium: 2.4 mg/dL (ref 1.7–2.4)

## 2021-04-26 LAB — PROCALCITONIN: Procalcitonin: 27.56 ng/mL

## 2021-04-26 LAB — URINE CULTURE: Culture: <10000 — AB

## 2021-04-26 LAB — BRAIN NATRIURETIC PEPTIDE: B Natriuretic Peptide: 491.6 pg/mL — ABNORMAL HIGH (ref 0.0–100.0)

## 2021-04-26 MED ORDER — POTASSIUM CHLORIDE CRYS ER 20 MEQ PO TBCR
20.0000 meq | EXTENDED_RELEASE_TABLET | Freq: Two times a day (BID) | ORAL | Status: DC
  Administered 2021-04-27 – 2021-04-28 (×3): 20 meq via ORAL
  Filled 2021-04-26 (×3): qty 1

## 2021-04-26 MED ORDER — FUROSEMIDE 40 MG PO TABS
40.0000 mg | ORAL_TABLET | Freq: Two times a day (BID) | ORAL | Status: DC
  Administered 2021-04-27 – 2021-04-28 (×3): 40 mg via ORAL
  Filled 2021-04-26 (×3): qty 1

## 2021-04-26 MED ORDER — ISOSORBIDE MONONITRATE ER 30 MG PO TB24
15.0000 mg | ORAL_TABLET | Freq: Every day | ORAL | Status: DC
  Administered 2021-04-26 – 2021-04-28 (×3): 15 mg via ORAL
  Filled 2021-04-26 (×3): qty 1

## 2021-04-26 MED ORDER — METHYLPREDNISOLONE SODIUM SUCC 40 MG IJ SOLR
40.0000 mg | Freq: Two times a day (BID) | INTRAMUSCULAR | Status: DC
  Administered 2021-04-26 – 2021-04-28 (×5): 40 mg via INTRAVENOUS
  Filled 2021-04-26 (×5): qty 1

## 2021-04-26 NOTE — Plan of Care (Signed)
  Problem: Health Behavior/Discharge Planning: Goal: Ability to manage health-related needs will improve Outcome: Progressing   Problem: Clinical Measurements: Goal: Ability to maintain clinical measurements within normal limits will improve Outcome: Progressing Goal: Will remain free from infection Outcome: Progressing Goal: Respiratory complications will improve Outcome: Progressing   Problem: Activity: Goal: Risk for activity intolerance will decrease Outcome: Progressing   

## 2021-04-26 NOTE — Progress Notes (Signed)
?                                  PROGRESS NOTE                                             ?                                                                                                                     ?                                         ? ? Patient Demographics:  ? ? Max Rivas, is a 71 y.o. male, DOB - [birth year], [id number] ? ?Outpatient Primary MD for the patient is [name], MD    LOS - 1  Admit date - 04/25/2021   ? ?Chief Complaint  ?Patient presents with  ? Respiratory Distress  ?    ? ?Brief Narrative (HPI from H&P)  71 y.o. male with known history of CAD s/p CABG and left atrial appendage clipping, history of recurrent VT s/p ablation and ICD, chronic systolic heart failure last EF measured was 30 to 35% in March 2022, paroxysmal atrial fibrillation, COPD presents to the ER after patient started having shortness of breath with the rigors since yesterday morning.  Has been having some cough not productive of sputum.  His work-up was suggestive of acute on chronic hypoxic respiratory failure he requires 4 L of nasal cannula oxygen at baseline and was placed on 12 L to maintain a pulse ox of over 90%, he also had sepsis related to pneumonia and admitted for further care. ? ? Subjective:  ? ?Patient in bed, appears comfortable, denies any headache, no fever, no chest pain or pressure, improved cough and  shortness of breath , no abdominal pain. No new focal weakness. ? ? ? Assessment  & Plan :  ? ? ?Sepsis due to pneumonia.  Suspicious for aspiration pneumonia.  Continue gentle IV fluids for hydration, sepsis pathophysiology seems to be much improved and almost resolved, have adjusted antibiotics to IV Unasyn and oral Levaquin, aspiration precautions, speech to evaluate.  Continue to monitor closely. ? ?2.  Acute on chronic hypoxic respiratory failure.  At baseline uses 4 L nasal cannula oxygen, on 4 L he was saturating below 85% requiring to be  placed on 12 L nasal cannula oxygen at time of admission, currently titrated down to 6 L.  We will continue to monitor.  Continue supportive care with nebulizer treatments, encouraged to sit up in chair use flutter valve and I-S for pulmonary toiletry.  Will gradually advance activity and titrate down oxygen.  Added low-dose midodrine for now ? ?  3.  Non-ACS pattern elevation in troponin.  Due to demand mismatch from sepsis, trend is flat, continue beta-blocker as tolerated by blood pressure, statin along with Eliquis for secondary prevention. ? ?4.  History of CAD s/p CABG as in #3 above. ? ?5.  Paroxysmal A-fib, history of V. tach s/p ablation and AICD placement in the past.  Mali vas 2 score of greater than 3.  On combination of amiodarone and Eliquis which will be continued, telemetry monitor, monitor electrolytes closely, low-dose beta-blocker if blood pressure is stable. ? ?6.  Chronic systolic heart failure EF around 35% currently compensated, low-dose beta-blocker, Imdur and Lasix if tolerated by blood pressure.  No ACE/ARB/Entresto due to underlying CKD. ? ?7.  Dyslipidemia.  On statin. ? ?8.  CKD 3B.  Baseline creatinine appears to be close to 1.5.  Monitor. ? ?9.  Chronic hypoxic respiratory failure secondary to underlying COPD .  Has developed some wheezing will add low-dose steroids IV, continue supportive care with nebulizer treatments.  Oxygen as needed. ? ?10.  Hypothyroidism.  TSH of 1.4.  Continue home dose Synthroid. ? ?11.  History of OSA.  Does not wear CPAP at home.  Nighttime and daytime oxygen to continue. ? ?   ? ?Condition - Guarded ? ?Family Communication  :  none present ? ?Code Status :  Full ? ?Consults  :  None ? ?PUD Prophylaxis : PPI ? ? Procedures  :    ? ?CT chest abdomen pelvis noncontrast - 1. Right lower lung airspace and ground-glass opacities, progressive since 07/25/2018. 2. Cholelithiasis 3. Aortic Atherosclerosis (ICD10-I70.0) and Emphysema  ? ?   ? ?Disposition Plan  :    ? ?Status is: Inpatient ? ?DVT Prophylaxis  :   ? ?apixaban (ELIQUIS) tablet 5 mg  ?  ? ?Lab Results  ?Component Value Date  ? PLT 125 (L) 04/26/2021  ? ? ?Diet :  ?Diet Order   ? ?       ?  Diet Heart Room service appropriate? Yes; Fluid consistency: Thin; Fluid restriction: 1200 mL Fluid  Diet effective now       ?  ? ?  ?  ? ?  ?  ? ?Inpatient Medications ? ?Scheduled Meds: ? amiodarone  400 mg Oral BID  ? apixaban  5 mg Oral BID  ? arformoterol  15 mcg Nebulization BID  ? And  ? umeclidinium bromide  1 puff Inhalation Daily  ? aspirin  81 mg Oral Daily  ? budesonide  0.5 mg Inhalation BID  ? [START ON 04/27/2021] furosemide  40 mg Oral BID  ? gabapentin  200 mg Oral BID  ? isosorbide mononitrate  15 mg Oral Daily  ? levofloxacin  500 mg Oral Daily  ? levothyroxine  75 mcg Oral Q0600  ? methylPREDNISolone (SOLU-MEDROL) injection  40 mg Intravenous Q12H  ? metoprolol succinate  12.5 mg Oral q AM  ? mexiletine  150 mg Oral BID  ? midodrine  5 mg Oral TID WC  ? montelukast  10 mg Oral QHS  ? omega-3 acid ethyl esters  1 g Oral BID  ? pantoprazole  40 mg Oral Daily  ? [START ON 04/27/2021] potassium chloride SA  20 mEq Oral BID  ? rosuvastatin  20 mg Oral QHS  ? ?Continuous Infusions: ? ampicillin-sulbactam (UNASYN) IV 3 g (04/26/21 0531)  ? ?PRN Meds:.acetaminophen **OR** acetaminophen, levalbuterol, nitroGLYCERIN, polyvinyl alcohol ? ?Time Spent in minutes  30 ? ? ?Lala Lund M.D on 04/26/2021  at 9:03 AM ? ?To page go to [email or web address]  ? ?[facility] -  Office  [phone] ? ?See all Orders from today for further details ? ? ? Objective:  ? ?Vitals:  ? 04/26/21 [id number] 04/26/21 [id number] 04/26/21 0819 04/26/21 0828  ?BP: (!) 113/58 (!) 104/58    ?Pulse: 67 69    ?Resp:  17    ?Temp:  97.9 ?F (36.6 ?C)    ?TempSrc:  Oral    ?SpO2:  97% 95% 92%  ?Weight:      ?Height:      ? ? ?Wt Readings from Last 3 Encounters:  ?04/26/21 102.2 kg  ?03/13/21 106.6 kg  ?01/03/21 98.4 kg  ? ? ? ?Intake/Output Summary (Last 24  hours) at 04/26/2021 0903 ?Last data filed at 04/25/2021 2314 ?Gross per 24 hour  ?Intake 959.56 ml  ?Output 1600 ml  ?Net -640.44 ml  ? ? ? ?Physical Exam ? ?Awake Alert, No new F.N deficits, Normal affect ?[place].AT,PERRAL ?Supple Neck, No JVD,   ?Symmetrical Chest wall movement, Good air movement bilaterally, CTAB ?RRR,No Gallops, Rubs or new Murmurs,  ?+ve B.Sounds, Abd Soft, No tenderness,   ?No Cyanosis, Clubbing or edema  ? ?  ? ?RN pressure injury documentation: ?Pressure Injury 05/26/19 Buttocks Right Deep Tissue Pressure Injury - Purple or maroon localized area of discolored intact skin or blood-filled blister due to damage of underlying soft tissue from pressure and/or shear. (Active)  ?05/26/19 1200  ?Location: Buttocks  ?Location Orientation: Right  ?Staging: Deep Tissue Pressure Injury - Purple or maroon localized area of discolored intact skin or blood-filled blister due to damage of underlying soft tissue from pressure and/or shear.  ?Wound Description (Comments):   ?Present on Admission:   ? ? ? Data Review:  ? ? ?CBC ?Recent Labs  ?Lab 04/25/21 ?0154 04/25/21 ?0206 04/25/21 ?0211 04/25/21 ?[id number] 04/26/21 ?0309  ?WBC 13.4*  --   --  14.5* 16.0*  ?HGB 15.1 15.0 15.3 13.8 12.4*  ?HCT 44.6 44.0 45.0 41.1 36.3*  ?PLT 152  --   --  131* 125*  ?MCV 91.4  --   --  91.7 90.8  ?MCH 30.9  --   --  30.8 31.0  ?MCHC 33.9  --   --  33.6 34.2  ?RDW 14.7  --   --  14.6 15.0  ?LYMPHSABS 0.7  --   --  0.5* 0.7  ?MONOABS 1.2*  --   --  0.9 0.8  ?EOSABS 0.0  --   --  0.4 0.0  ?BASOSABS 0.0  --   --  0.0 0.0  ? ? ?Electrolytes ?Recent Labs  ?Lab 04/25/21 ?0154 04/25/21 ?0206 04/25/21 ?0211 04/25/21 ?[id number] 04/25/21 ?[id number] 04/25/21 ?1043 04/25/21 ?1044 04/26/21 ?0309  ?NA 136 137 137 137  --   --   --  139  ?K 3.2* 3.2* 3.2* 3.3*  --   --   --  4.3  ?CL 102  --  101 102  --   --   --  105  ?CO2 22  --   --  24  --   --   --  26  ?GLUCOSE 156*  --  153* 153*  --   --   --  126*  ?BUN 14  --  16 14  --   --   --  20  ?CREATININE  1.51*  --  1.40* 1.68*  --   --   --  1.44*  ?CALCIUM 8.6*  --   --  8.4*  --   --   --  8.4*  ?AST 40  --   --  52*  --   --   --  77*  ?ALT 31  --   --  39  --   --   --  63*  ?ALKPHOS 65  --   --  57  --   --

## 2021-04-26 NOTE — Plan of Care (Signed)
  Problem: Education: Goal: Knowledge of General Education information will improve Description Including pain rating scale, medication(s)/side effects and non-pharmacologic comfort measures Outcome: Progressing   

## 2021-04-26 NOTE — Evaluation (Signed)
Physical Therapy Evaluation ?Patient Details ?Name: Max Rivas ?MRN: [id number] ?DOB: 1950-05-08 ?Today's Date: 04/26/2021 ? ?History of Present Illness ? Pt is a 71 y.o. male who presented 04/25/21 with SOB. Pt admitted with sepsis related to PNA. PMH: CAD s/p CABG and left atrial appendage clipping, history of recurrent VT s/p ablation and ICD, CHF, COPD, paroxysmal Afib ?  ?Clinical Impression ? Pt presents with condition above and deficits mentioned below, see PT Problem List. PTA, he was mod I, not having UE support for mobility in his home but using a rollator in the community. He lives with a roommate in a mobile home with a ramp entrance. He is on 4L of continuous supplemental O2 at baseline. Currently, pt appears to be functioning close to his baseline, but displays some mild deficits in balance and decreased activity tolerance/aerobic endurance per pt's reported baseline. Pt required 6-8L of O2 for mobility and several minutes to sit and rest on 8L of O2 to recover his sats whenever he exerted himself. Discussed weighing himself daily, fluid intake, eating fresh/frozen vegetables and rinsing canned veggies, increasing frequency of activity, limiting sodium/processed foods intake, and energy conservation with pt verbalizing understanding. Pt would benefit from further acute PT and HHPT follow-up to address his deficits to maximize his safety with mobility and his endurance. ?   ? ?Recommendations for follow up therapy are one component of a multi-disciplinary discharge planning process, led by the attending physician.  Recommendations may be updated based on patient status, additional functional criteria and insurance authorization. ? ?Follow Up Recommendations Home health PT ? ?  ?Assistance Recommended at Discharge PRN  ?Patient can return home with the following ? Assistance with cooking/housework ? ?  ?Equipment Recommendations None recommended by PT  ?Recommendations for Other Services ?    ?   ?Functional Status Assessment Patient has had a recent decline in their functional status and demonstrates the ability to make significant improvements in function in a reasonable and predictable amount of time.  ? ?  ?Precautions / Restrictions Precautions ?Precautions: Fall;Other (comment) ?Precaution Comments: watch SpO2 (on 4L O2 baseline) ?Restrictions ?Weight Bearing Restrictions: No  ? ?  ? ?Mobility ? Bed Mobility ?Overal bed mobility: Modified Independent ?  ?  ?  ?  ?  ?  ?General bed mobility comments: Pt able to perform all bed mobility aspects with HOB elevated without assistance. ?  ? ?Transfers ?Overall transfer level: Needs assistance ?Equipment used: None ?Transfers: Sit to/from Stand ?Sit to Stand: Supervision ?  ?  ?  ?  ?  ?General transfer comment: Supervision for safety, no LOB. ?  ? ?Ambulation/Gait ?Ambulation/Gait assistance: Min guard ?Gait Distance (Feet): 70 Feet ?Assistive device: None ?Gait Pattern/deviations: Step-through pattern, Decreased stride length ?Gait velocity: reduced ?Gait velocity interpretation: 1.31 - 2.62 ft/sec, indicative of limited community ambulator ?  ?General Gait Details: Pt with slightly decreased gait speed, no LOB, but DOE 3/4 with SpO2 decreasing to 88% on 8L while ambulating. Min guard for safety. ? ?Stairs ?  ?  ?  ?  ?  ? ?Wheelchair Mobility ?  ? ?Modified Rankin (Stroke Patients Only) ?  ? ?  ? ?Balance Overall balance assessment: Mild deficits observed, not formally tested ?  ?  ?  ?  ?  ?  ?  ?  ?  ?  ?  ?  ?  ?  ?  ?  ?  ?  ?   ? ? ? ?Pertinent Vitals/Pain Pain Assessment ?  Pain Assessment: No/denies pain  ? ? ?Home Living Family/patient expects to be discharged to:: Private residence ?Living Arrangements: Non-relatives/Friends ?Available Help at Discharge: Friend(s);Available 24 hours/day ?Type of Home: Mobile home ?Home Access: Ramped entrance ?  ?  ?  ?Home Layout: One level ?Home Equipment: Grab bars - tub/shower;Shower seat;Rollator (4  wheels);Hand held shower head ?Additional Comments: on 4L O2 baseline  ?  ?Prior Function Prior Level of Function : Independent/Modified Independent;Driving ?  ?  ?  ?  ?  ?  ?Mobility Comments: Uses rollator outside but no UE support when in his home. Fatigues easily. No falls in last 6 months. ?ADLs Comments: Cooks, cleans, and does all ADLs mod I. Manages own meds and finances. Drives. Not working. ?  ? ? ?Hand Dominance  ? Dominant Hand: Right ? ?  ?Extremity/Trunk Assessment  ? Upper Extremity Assessment ?Upper Extremity Assessment: Overall WFL for tasks assessed (MMT scores of 4+ to 5 grossly; denied numbness/tingling bil) ?  ? ?Lower Extremity Assessment ?Lower Extremity Assessment: Generalized weakness (with prolonged mobility, thus decreased muscular endurance, but MMT scores of 4+ to 5 grossly; denied numbness/tingling bil) ?  ? ?Cervical / Trunk Assessment ?Cervical / Trunk Assessment: Normal  ?Communication  ? Communication: No difficulties  ?Cognition Arousal/Alertness: Awake/alert ?Behavior During Therapy: [facility] for tasks assessed/performed ?Overall Cognitive Status: Within Functional Limits for tasks assessed ?  ?  ?  ?  ?  ?  ?  ?  ?  ?  ?  ?  ?  ?  ?  ?  ?  ?  ?  ? ?  ?General Comments General comments (skin integrity, edema, etc.): SpO2 decreasing to 80s% on 6-8L O2 with an exertion during session, needing a couple minutes in sitting on 8L to recover ? ?  ?Exercises    ? ?Assessment/Plan  ?  ?PT Assessment Patient needs continued PT services  ?PT Problem List Decreased activity tolerance;Decreased balance;Decreased mobility;Cardiopulmonary status limiting activity ? ?   ?  ?PT Treatment Interventions DME instruction;Gait training;Stair training;Therapeutic activities;Functional mobility training;Therapeutic exercise;Neuromuscular re-education;Balance training;Patient/family education   ? ?PT Goals (Current goals can be found in the Care Plan section)  ?Acute Rehab PT Goals ?Patient Stated Goal: to  get better and improve his endurance ?PT Goal Formulation: With patient ?Time For Goal Achievement: 05/10/21 ?Potential to Achieve Goals: Good ? ?  ?Frequency Min 2X/week ?  ? ? ?Co-evaluation   ?  ?  ?  ?  ? ? ?  ?AM-PAC PT "6 Clicks" Mobility  ?Outcome Measure Help needed turning from your back to your side while in a flat bed without using bedrails?: None ?Help needed moving from lying on your back to sitting on the side of a flat bed without using bedrails?: None ?Help needed moving to and from a bed to a chair (including a wheelchair)?: A Little ?Help needed standing up from a chair using your arms (e.g., wheelchair or bedside chair)?: A Little ?Help needed to walk in hospital room?: A Little ?Help needed climbing 3-5 steps with a railing? : A Little ?6 Click Score: 20 ? ?  ?End of Session Equipment Utilized During Treatment: Oxygen ?Activity Tolerance: Patient tolerated treatment well ?Patient left: in bed;with call bell/phone within reach;with bed alarm set;with nursing/sitter in room ?Nurse Communication: Mobility status;Other (comment) (sats; to NT) ?PT Visit Diagnosis: Unsteadiness on feet (R26.81);Other abnormalities of gait and mobility (R26.89);Difficulty in walking, not elsewhere classified (R26.2) ?  ? ?Time: [id number] ?PT Time Calculation (min) (ACUTE  ONLY): 20 min ? ? ?Charges:   PT Evaluation ?$PT Eval Low Complexity: 1 Low ?  ?  ?   ? ? ?Moishe Spice, PT, DPT ?Acute Rehabilitation Services  ?Pager: [phone] ?Office: [phone] ? ? ?Maretta Bees Pettis ?04/26/2021, 5:05 PM ? ?

## 2021-04-27 LAB — CBC WITH DIFFERENTIAL/PLATELET
Abs Immature Granulocytes: 0.2 10*3/uL — ABNORMAL HIGH (ref 0.00–0.07)
Basophils Absolute: 0 10*3/uL (ref 0.0–0.1)
Basophils Relative: 0 %
Eosinophils Absolute: 0 10*3/uL (ref 0.0–0.5)
Eosinophils Relative: 0 %
HCT: 37.1 % — ABNORMAL LOW (ref 39.0–52.0)
Hemoglobin: 12.6 g/dL — ABNORMAL LOW (ref 13.0–17.0)
Immature Granulocytes: 2 %
Lymphocytes Relative: 5 %
Lymphs Abs: 0.6 10*3/uL — ABNORMAL LOW (ref 0.7–4.0)
MCH: 31.3 pg (ref 26.0–34.0)
MCHC: 34 g/dL (ref 30.0–36.0)
MCV: 92.3 fL (ref 80.0–100.0)
Monocytes Absolute: 0.4 10*3/uL (ref 0.1–1.0)
Monocytes Relative: 3 %
Neutro Abs: 11.1 10*3/uL — ABNORMAL HIGH (ref 1.7–7.7)
Neutrophils Relative %: 90 %
Platelets: 137 10*3/uL — ABNORMAL LOW (ref 150–400)
RBC: 4.02 MIL/uL — ABNORMAL LOW (ref 4.22–5.81)
RDW: 15.4 % (ref 11.5–15.5)
WBC: 12.2 10*3/uL — ABNORMAL HIGH (ref 4.0–10.5)
nRBC: 0 % (ref 0.0–0.2)

## 2021-04-27 LAB — COMPREHENSIVE METABOLIC PANEL
ALT: 51 U/L — ABNORMAL HIGH (ref 0–44)
AST: 50 U/L — ABNORMAL HIGH (ref 15–41)
Albumin: 2.6 g/dL — ABNORMAL LOW (ref 3.5–5.0)
Alkaline Phosphatase: 36 U/L — ABNORMAL LOW (ref 38–126)
Anion gap: 8 (ref 5–15)
BUN: 20 mg/dL (ref 8–23)
CO2: 25 mmol/L (ref 22–32)
Calcium: 8.3 mg/dL — ABNORMAL LOW (ref 8.9–10.3)
Chloride: 104 mmol/L (ref 98–111)
Creatinine, Ser: 1.23 mg/dL (ref 0.61–1.24)
GFR, Estimated: >60 mL/min (ref >60)
Glucose, Bld: 144 mg/dL — ABNORMAL HIGH (ref 70–99)
Potassium: 4.6 mmol/L (ref 3.5–5.1)
Sodium: 137 mmol/L (ref 135–145)
Total Bilirubin: 0.6 mg/dL (ref 0.3–1.2)
Total Protein: 5.8 g/dL — ABNORMAL LOW (ref 6.5–8.1)

## 2021-04-27 LAB — MAGNESIUM: Magnesium: 2.4 mg/dL (ref 1.7–2.4)

## 2021-04-27 LAB — PROCALCITONIN: Procalcitonin: 14.14 ng/mL

## 2021-04-27 LAB — BRAIN NATRIURETIC PEPTIDE: B Natriuretic Peptide: 665 pg/mL — ABNORMAL HIGH (ref 0.0–100.0)

## 2021-04-27 MED ORDER — ALUM & MAG HYDROXIDE-SIMETH 200-200-20 MG/5ML PO SUSP
30.0000 mL | Freq: Four times a day (QID) | ORAL | Status: DC | PRN
  Administered 2021-04-27: 30 mL via ORAL
  Filled 2021-04-27: qty 30

## 2021-04-27 MED ORDER — PNEUMOCOCCAL 20-VAL CONJ VACC 0.5 ML IM SUSY
0.5000 mL | PREFILLED_SYRINGE | INTRAMUSCULAR | Status: AC
  Administered 2021-04-28: 0.5 mL via INTRAMUSCULAR
  Filled 2021-04-27: qty 0.5

## 2021-04-27 NOTE — Progress Notes (Signed)
Mobility Specialist Progress Note: ? ? 04/27/21 1500  ?Mobility  ?Activity Ambulated with assistance in hallway  ?Level of Assistance Standby assist, set-up cues, supervision of patient - no hands on  ?Assistive Device None  ?Distance Ambulated (ft) 400 ft  ?Activity Response Tolerated fair  ?$Mobility charge 1 Mobility  ? ? ?Pre Mobility: SpO2 97% 4LO2 ?During Mobility: SpO2 86-91% 8LO2 ?Post Mobility: SpO2 92% 4LO2 ? ?Pt received on 4LO2, eager for mobility at this time. Required 8LO2 to maintain SpO2 >85%, recovered to 90% with standing rest breaks and pursed lip breathing. Pt back on 4LO2, SpO2 92% upon my departure.  ? ?Addison Lank ?Acute Rehab ?Phone: [phone] ?Office Phone: [phone] ? ?

## 2021-04-27 NOTE — Progress Notes (Signed)
?                                  PROGRESS NOTE                                             ?                                                                                                                     ?                                         ? ? Patient Demographics:  ? ? Max Rivas, is a 71 y.o. male, DOB - [birth year], [id number] ? ?Outpatient Primary MD for the patient is [name], MD    LOS - 2  Admit date - 04/25/2021   ? ?Chief Complaint  ?Patient presents with  ? Respiratory Distress  ?    ? ?Brief Narrative (HPI from H&P)  71 y.o. male with known history of CAD s/p CABG and left atrial appendage clipping, history of recurrent VT s/p ablation and ICD, chronic systolic heart failure last EF measured was 30 to 35% in March 2022, paroxysmal atrial fibrillation, COPD presents to the ER after patient started having shortness of breath with the rigors since yesterday morning.  Has been having some cough not productive of sputum.  His work-up was suggestive of acute on chronic hypoxic respiratory failure he requires 4 L of nasal cannula oxygen at baseline and was placed on 12 L to maintain a pulse ox of over 90%, he also had sepsis related to pneumonia and admitted for further care. ? ? Subjective:  ? ?Patient in bed, appears comfortable, denies any headache, no fever, no chest pain or pressure, no shortness of breath , no abdominal pain. No new focal weakness. ? ? Assessment  & Plan :  ? ? ?Sepsis due to pneumonia.  Suspicious for aspiration pneumonia.  Continue gentle IV fluids for hydration, sepsis pathophysiology seems to be much improved and almost resolved, have adjusted antibiotics to IV Unasyn and oral Levaquin, aspiration precautions, speech to evaluate.  Continue to monitor closely, clinically much improved. ? ?2.  Acute on chronic hypoxic respiratory failure.  At baseline uses 4 L nasal cannula oxygen, on 4 L he was saturating below 85% requiring to  be placed on 12 L nasal cannula oxygen at time of admission, currently titrated down to 6 L.  We will continue to monitor.  Continue supportive care with nebulizer treatments, encouraged to sit up in chair use flutter valve and I-S for pulmonary toiletry.  Will gradually advance activity and titrate down oxygen.  Added low-dose midodrine for now ? ?3.  Non-ACS pattern elevation in troponin.  Due to demand mismatch from sepsis, trend is flat, continue beta-blocker as tolerated by blood pressure, statin along with Eliquis for secondary prevention. ? ?4.  History of CAD s/p CABG as in #3 above. ? ?5.  Paroxysmal A-fib, history of V. tach s/p ablation and AICD placement in the past.  Mali vas 2 score of greater than 3.  On combination of amiodarone and Eliquis which will be continued, telemetry monitor, monitor electrolytes closely, low-dose beta-blocker if blood pressure is stable. ? ?6.  Chronic systolic heart failure EF around 35% currently compensated, low-dose beta-blocker, Imdur and Lasix if tolerated by blood pressure.  No ACE/ARB/Entresto due to underlying CKD. ? ?7.  Dyslipidemia.  On statin. ? ?8.  CKD 3B.  Baseline creatinine appears to be close to 1.5.  Monitor. ? ?9.  Chronic hypoxic respiratory failure secondary to underlying COPD .  Has developed some wheezing will add low-dose steroids IV, continue supportive care with nebulizer treatments.  Oxygen as needed. ? ?10. Hypothyroidism.  TSH of 1.4.  Continue home dose Synthroid. ? ?11. History of OSA.  Does not wear CPAP at home.  Nighttime and daytime oxygen to continue. ? ?   ? ?Condition - Guarded ? ?Family Communication  :  none present ? ?Code Status :  Full ? ?Consults  :  None ? ?PUD Prophylaxis : PPI ? ? Procedures  :    ? ?CT chest abdomen pelvis noncontrast - 1. Right lower lung airspace and ground-glass opacities, progressive since 07/25/2018. 2. Cholelithiasis 3. Aortic Atherosclerosis (ICD10-I70.0) and Emphysema  ? ?   ? ?Disposition Plan  :    ? ?Status is: Inpatient ? ?DVT Prophylaxis  :   ? ?apixaban (ELIQUIS) tablet 5 mg  ?  ? ?Lab Results  ?Component Value Date  ? PLT 137 (L) 04/27/2021  ? ? ?Diet :  ?Diet Order   ? ?       ?  Diet Heart Room service appropriate? Yes; Fluid consistency: Thin; Fluid restriction: 1200 mL Fluid  Diet effective now       ?  ? ?  ?  ? ?  ?  ? ?Inpatient Medications ? ?Scheduled Meds: ? amiodarone  400 mg Oral BID  ? apixaban  5 mg Oral BID  ? arformoterol  15 mcg Nebulization BID  ? And  ? umeclidinium bromide  1 puff Inhalation Daily  ? aspirin  81 mg Oral Daily  ? budesonide  0.5 mg Inhalation BID  ? furosemide  40 mg Oral BID  ? gabapentin  200 mg Oral BID  ? isosorbide mononitrate  15 mg Oral Daily  ? levofloxacin  500 mg Oral Daily  ? levothyroxine  75 mcg Oral Q0600  ? methylPREDNISolone (SOLU-MEDROL) injection  40 mg Intravenous Q12H  ? metoprolol succinate  12.5 mg Oral q AM  ? mexiletine  150 mg Oral BID  ? midodrine  5 mg Oral TID WC  ? montelukast  10 mg Oral QHS  ? omega-3 acid ethyl esters  1 g Oral BID  ? pantoprazole  40 mg Oral Daily  ? [START ON 04/28/2021] pneumococcal 20-valent conjugate vaccine  0.5 mL Intramuscular Tomorrow-1000  ? potassium chloride SA  20 mEq Oral BID  ? rosuvastatin  20 mg Oral QHS  ? ?Continuous Infusions: ? ampicillin-sulbactam (UNASYN) IV 3 g (04/27/21 [id number])  ? ?PRN Meds:.acetaminophen **OR** acetaminophen, levalbuterol, nitroGLYCERIN, polyvinyl alcohol ? ?Time Spent in minutes  30 ? ? ?Brylei Pedley  Candiss Norse M.D on 04/27/2021 at 9:35 AM ? ?To page go to [email or web address]  ? ?[facility] -  Office  [phone] ? ?See all Orders from today for further details ? ? ? Objective:  ? ?Vitals:  ? 04/27/21 0350 04/27/21 0615 04/27/21 0754 04/27/21 0757  ?BP: 118/65 131/66  130/73  ?Pulse: 65 66  70  ?Resp: (!) 22   17  ?Temp: 97.9 ?F (36.6 ?C)   97.9 ?F (36.6 ?C)  ?TempSrc: Oral   Oral  ?SpO2: 93%  93% 97%  ?Weight:      ?Height:      ? ? ?Wt Readings from Last 3 Encounters:  ?04/26/21 102.2  kg  ?03/13/21 106.6 kg  ?01/03/21 98.4 kg  ? ? ? ?Intake/Output Summary (Last 24 hours) at 04/27/2021 0935 ?Last data filed at 04/26/2021 2000 ?Gross per 24 hour  ?Intake 480 ml  ?Output 1450 ml  ?Net -970 ml  ? ? ? ?Physical Exam ? ?Awake Alert, No new F.N deficits, Normal affect ?[place].AT,PERRAL ?Supple Neck, No JVD,   ?Symmetrical Chest wall movement, Good air movement bilaterally, CTAB ?RRR,No Gallops, Rubs or new Murmurs,  ?+ve B.Sounds, Abd Soft, No tenderness,   ?No Cyanosis, Clubbing or edema  ?  ? ?RN pressure injury documentation: ?Pressure Injury 05/26/19 Buttocks Right Deep Tissue Pressure Injury - Purple or maroon localized area of discolored intact skin or blood-filled blister due to damage of underlying soft tissue from pressure and/or shear. (Active)  ?05/26/19 1200  ?Location: Buttocks  ?Location Orientation: Right  ?Staging: Deep Tissue Pressure Injury - Purple or maroon localized area of discolored intact skin or blood-filled blister due to damage of underlying soft tissue from pressure and/or shear.  ?Wound Description (Comments):   ?Present on Admission:   ? ? ? Data Review:  ? ? ?CBC ? ?Recent Labs  ?Lab 04/25/21 ?0154 04/25/21 ?0206 04/25/21 ?0211 04/25/21 ?[id number] 04/26/21 ?0309 04/27/21 ?0543  ?WBC 13.4*  --   --  14.5* 16.0* 12.2*  ?HGB 15.1 15.0 15.3 13.8 12.4* 12.6*  ?HCT 44.6 44.0 45.0 41.1 36.3* 37.1*  ?PLT 152  --   --  131* 125* 137*  ?MCV 91.4  --   --  91.7 90.8 92.3  ?MCH 30.9  --   --  30.8 31.0 31.3  ?MCHC 33.9  --   --  33.6 34.2 34.0  ?RDW 14.7  --   --  14.6 15.0 15.4  ?LYMPHSABS 0.7  --   --  0.5* 0.7 0.6*  ?MONOABS 1.2*  --   --  0.9 0.8 0.4  ?EOSABS 0.0  --   --  0.4 0.0 0.0  ?BASOSABS 0.0  --   --  0.0 0.0 0.0  ? ? ?Electrolytes ? ?Recent Labs  ?Lab 04/25/21 ?0154 04/25/21 ?0206 04/25/21 ?0211 04/25/21 ?[id number] 04/25/21 ?[id number] 04/25/21 ?1043 04/25/21 ?1044 04/26/21 ?0309 04/27/21 ?0543  ?NA 136 137 137 137  --   --   --  139 137  ?K 3.2* 3.2* 3.2* 3.3*  --   --   --  4.3 4.6  ?CL 102   --  101 102  --   --   --  105 104  ?CO2 22  --   --  24  --   --   --  26 25  ?GLUCOSE 156*  --  153* 153*  --   --   --  126* 144*  ?BUN 14  --  16 14  --   --   --  20 20  ?CREATININE 1.51*  --  1.4

## 2021-04-27 NOTE — Evaluation (Signed)
Occupational Therapy Evaluation ?Patient Details ?Name: Max Rivas ?MRN: [id number] ?DOB: 1950-09-16 ?Today's Date: 04/27/2021 ? ? ?History of Present Illness Pt is a 71 y.o. male who presented 04/25/21 with SOB. Pt admitted with sepsis related to PNA. PMH: CAD s/p CABG and left atrial appendage clipping, history of recurrent VT s/p ablation and ICD, CHF, COPD, paroxysmal Afib  ? ?Clinical Impression ?  ?Pt is functioning at or near his baseline in ADLs and mobility. He is well versed in energy conservation and pursed lip breathing techniques. All DME needs in the home are met. Pt has a pulse ox in the home and reports his Sp02 has been known to drop to 71% with exertion at which point he will increase his 02 to 6L, rest and perform pursed lip breathing until he recovers. Pt is faithful using his IS and flutter valve. Encouraged ambulation and up to chair for meals.  ?   ? ?Recommendations for follow up therapy are one component of a multi-disciplinary discharge planning process, led by the attending physician.  Recommendations may be updated based on patient status, additional functional criteria and insurance authorization.  ? ?Follow Up Recommendations ? No OT follow up  ?  ?Assistance Recommended at Discharge PRN  ?Patient can return home with the following Assistance with cooking/housework;Assist for transportation ? ?  ?Functional Status Assessment ? Patient has had a recent decline in their functional status and demonstrates the ability to make significant improvements in function in a reasonable and predictable amount of time.  ?Equipment Recommendations ? None recommended by OT  ?  ?Recommendations for Other Services   ? ? ?  ?Precautions / Restrictions Precautions ?Precautions: Fall;Other (comment) ?Precaution Comments: watch SpO2 (on 4L O2 baseline)  ? ?  ? ?Mobility Bed Mobility ?Overal bed mobility: Modified Independent ?  ?  ?  ?  ?  ?  ?General bed mobility comments: HOB up ?  ? ?Transfers ?Overall  transfer level: Modified independent ?  ?  ?  ?  ?  ?  ?  ?  ?  ?  ? ?  ?Balance Overall balance assessment: Mild deficits observed, not formally tested ?  ?  ?  ?  ?  ?  ?  ?  ?  ?  ?  ?  ?  ?  ?  ?  ?  ?  ?   ? ?ADL either performed or assessed with clinical judgement  ? ?ADL Overall ADL's : At baseline;Modified independent ?  ?  ?  ?  ?  ?  ?  ?  ?  ?  ?  ?  ?  ?  ?  ?  ?  ?  ?  ?General ADL Comments: pt is knowledgeable in energy conservation and pursed lip breathing  ? ? ? ?Vision Baseline Vision/History: 1 Wears glasses ?Ability to See in Adequate Light: 0 Adequate ?Patient Visual Report: No change from baseline ?Additional Comments: glasses are at home  ?   ?Perception   ?  ?Praxis   ?  ? ?Pertinent Vitals/Pain Pain Assessment ?Pain Assessment: No/denies pain  ? ? ? ?Hand Dominance Right ?  ?Extremity/Trunk Assessment Upper Extremity Assessment ?Upper Extremity Assessment: Overall WFL for tasks assessed ?  ?Lower Extremity Assessment ?Lower Extremity Assessment: Defer to PT evaluation ?  ?Cervical / Trunk Assessment ?Cervical / Trunk Assessment: Normal ?  ?Communication Communication ?Communication: No difficulties ?  ?Cognition Arousal/Alertness: Awake/alert ?Behavior During Therapy: [facility] for tasks assessed/performed ?Overall Cognitive Status:  Within Functional Limits for tasks assessed ?  ?  ?  ?  ?  ?  ?  ?  ?  ?  ?  ?  ?  ?  ?  ?  ?  ?  ?  ?General Comments    ? ?  ?Exercises   ?  ?Shoulder Instructions    ? ? ?Home Living Family/patient expects to be discharged to:: Private residence ?Living Arrangements: Non-relatives/Friends ?Available Help at Discharge: Friend(s);Available 24 hours/day ?Type of Home: Mobile home ?Home Access: Ramped entrance ?  ?  ?Home Layout: One level ?  ?  ?Bathroom Shower/Tub: Walk-in shower ?  ?Bathroom Toilet: Handicapped height ?  ?  ?Home Equipment: Grab bars - tub/shower;Shower seat;Rollator (4 wheels);Hand held shower head ?  ?Additional Comments: on 4L O2 baseline ?  ? ?   ?Prior Functioning/Environment Prior Level of Function : Independent/Modified Independent;Driving ?  ?  ?  ?  ?  ?  ?Mobility Comments: Uses rollator outside but no UE support when in his home. Fatigues easily. No falls in last 6 months. 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 Manages own meds and finances. Drives. Not working. ?  ? ?  ?  ?OT Problem List:   ?  ?   ?OT Treatment/Interventions:    ?  ?OT Goals(Current goals can be found in the care plan section) Acute Rehab OT Goals ?OT Goal Formulation: With patient  ?OT Frequency:   ?  ? ?Co-evaluation   ?  ?  ?  ?  ? ?  ?AM-PAC OT "6 Clicks" Daily Activity     ?Outcome Measure Help from another person eating meals?: None ?Help from another person taking care of personal grooming?: None ?Help from another person toileting, which includes using toliet, bedpan, or urinal?: None ?Help from another person bathing (including washing, rinsing, drying)?: None ?Help from another person to put on and taking off regular upper body clothing?: None ?Help from another person to put on and taking off regular lower body clothing?: None ?6 Click Score: 24 ?  ?End of Session Equipment Utilized During Treatment: Oxygen (4L) ? ?Activity Tolerance: Patient tolerated treatment well ?Patient left: in bed;with call bell/phone within reach ? ?OT Visit Diagnosis: Other (comment) (decreased activity tolerance)  ?              ?Time: 6433-2951 ?OT Time Calculation (min): 23 min ?Charges:  OT General Charges ?$OT Visit: 1 Visit ?OT Evaluation ?$OT Eval Low Complexity: 1 Low ?OT Treatments ?$Self Care/Home Management : 8-22 mins ? ?Nestor Lewandowsky, OTR/L ?Acute Rehabilitation Services ?Pager: [phone] ?Office: [phone]  ?Malka So ?04/27/2021, 12:28 PM ?

## 2021-04-28 ENCOUNTER — Other Ambulatory Visit (HOSPITAL_COMMUNITY): Payer: Self-pay

## 2021-04-28 LAB — COMPREHENSIVE METABOLIC PANEL
ALT: 52 U/L — ABNORMAL HIGH (ref 0–44)
AST: 42 U/L — ABNORMAL HIGH (ref 15–41)
Albumin: 3 g/dL — ABNORMAL LOW (ref 3.5–5.0)
Alkaline Phosphatase: 39 U/L (ref 38–126)
Anion gap: 7 (ref 5–15)
BUN: 27 mg/dL — ABNORMAL HIGH (ref 8–23)
CO2: 29 mmol/L (ref 22–32)
Calcium: 8.8 mg/dL — ABNORMAL LOW (ref 8.9–10.3)
Chloride: 104 mmol/L (ref 98–111)
Creatinine, Ser: 1.37 mg/dL — ABNORMAL HIGH (ref 0.61–1.24)
GFR, Estimated: 56 mL/min — ABNORMAL LOW (ref >60)
Glucose, Bld: 150 mg/dL — ABNORMAL HIGH (ref 70–99)
Potassium: 4.5 mmol/L (ref 3.5–5.1)
Sodium: 140 mmol/L (ref 135–145)
Total Bilirubin: 0.7 mg/dL (ref 0.3–1.2)
Total Protein: 6.3 g/dL — ABNORMAL LOW (ref 6.5–8.1)

## 2021-04-28 LAB — CBC WITH DIFFERENTIAL/PLATELET
Abs Immature Granulocytes: 0.16 10*3/uL — ABNORMAL HIGH (ref 0.00–0.07)
Basophils Absolute: 0 10*3/uL (ref 0.0–0.1)
Basophils Relative: 0 %
Eosinophils Absolute: 0 10*3/uL (ref 0.0–0.5)
Eosinophils Relative: 0 %
HCT: 40.4 % (ref 39.0–52.0)
Hemoglobin: 13.7 g/dL (ref 13.0–17.0)
Immature Granulocytes: 1 %
Lymphocytes Relative: 5 %
Lymphs Abs: 0.6 10*3/uL — ABNORMAL LOW (ref 0.7–4.0)
MCH: 31.3 pg (ref 26.0–34.0)
MCHC: 33.9 g/dL (ref 30.0–36.0)
MCV: 92.2 fL (ref 80.0–100.0)
Monocytes Absolute: 0.3 10*3/uL (ref 0.1–1.0)
Monocytes Relative: 3 %
Neutro Abs: 10.5 10*3/uL — ABNORMAL HIGH (ref 1.7–7.7)
Neutrophils Relative %: 91 %
Platelets: 172 10*3/uL (ref 150–400)
RBC: 4.38 MIL/uL (ref 4.22–5.81)
RDW: 15.5 % (ref 11.5–15.5)
WBC: 11.6 10*3/uL — ABNORMAL HIGH (ref 4.0–10.5)
nRBC: 0 % (ref 0.0–0.2)

## 2021-04-28 LAB — MAGNESIUM: Magnesium: 2.4 mg/dL (ref 1.7–2.4)

## 2021-04-28 LAB — BRAIN NATRIURETIC PEPTIDE: B Natriuretic Peptide: 744.6 pg/mL — ABNORMAL HIGH (ref 0.0–100.0)

## 2021-04-28 LAB — PROCALCITONIN: Procalcitonin: 7.2 ng/mL

## 2021-04-28 MED ORDER — AMOXICILLIN-POT CLAVULANATE 875-125 MG PO TABS
1.0000 | ORAL_TABLET | Freq: Two times a day (BID) | ORAL | 0 refills | Status: AC
  Filled 2021-04-28: qty 8, 4d supply, fill #0

## 2021-04-28 MED ORDER — LEVOFLOXACIN 500 MG PO TABS
500.0000 mg | ORAL_TABLET | Freq: Every day | ORAL | Status: DC
  Filled 2021-04-28: qty 1

## 2021-04-28 MED ORDER — LEVOFLOXACIN 500 MG PO TABS
500.0000 mg | ORAL_TABLET | Freq: Every day | ORAL | 0 refills | Status: DC
  Filled 2021-04-28: qty 2, 2d supply, fill #0

## 2021-04-28 MED ORDER — METHYLPREDNISOLONE 4 MG PO TBPK
ORAL_TABLET | ORAL | 0 refills | Status: DC
  Filled 2021-04-28: qty 21, 6d supply, fill #0

## 2021-04-28 NOTE — Plan of Care (Signed)
  Problem: Health Behavior/Discharge Planning: Goal: Ability to manage health-related needs will improve Outcome: Progressing   Problem: Clinical Measurements: Goal: Will remain free from infection Outcome: Progressing Goal: Diagnostic test results will improve Outcome: Progressing Goal: Respiratory complications will improve Outcome: Progressing Goal: Cardiovascular complication will be avoided Outcome: Progressing   

## 2021-04-28 NOTE — Progress Notes (Addendum)
Physical Therapy Treatment ?Patient Details ?Name: Max Rivas ?MRN: [id number] ?DOB: 02/15/1950 ?Today's Date: 04/28/2021 ? ? ?History of Present Illness Pt is a 71 y.o. male who presented 04/25/21 with SOB. Pt admitted with sepsis related to PNA. PMH: CAD s/p CABG and left atrial appendage clipping, history of recurrent VT s/p ablation and ICD, CHF, COPD, paroxysmal Afib ? ?  ?PT Comments  ? ? Pt received supine in bed, awake and oriented and eager to participate in the physical therapy session today. Pt was able to move from supine to sitting edge of bed independently. Pt remained hypoxic to 83% with functional mobility tasks on 6L of oxygen per min. He needed to increase to 8L per min to maintain spO2 increase to 88%. Pt participated in gait training in the hallway with RW for 413ft with a few short breaks every 55ft in standing to regain oxygen levels in the 90's. Pt required verbal cues for proper posture and pursed lip breathing. Educated pt on the importance of walking and using his breathing spirometer to strengthen his lungs. Educated use of pacing for energy conservation. Plan for next session is to continue with current plan of care with emphasis on use of pacing and proper posture. Pt will continue to benefit from physical therapy. Pt is making good progress towards goals. ? ?  ?Recommendations for follow up therapy are one component of a multi-disciplinary discharge planning process, led by the attending physician.  Recommendations may be updated based on patient status, additional functional criteria and insurance authorization. ? ?Follow Up Recommendations ? Home health PT ?  ?  ?Assistance Recommended at Discharge PRN  ?Patient can return home with the following Assistance with cooking/housework ?  ?Equipment Recommendations ? None recommended by PT  ?  ?Recommendations for Other Services   ? ? ?  ?Precautions / Restrictions Precautions ?Precautions: Fall;Other (comment) ?Precaution Comments: watch  SpO2 (on 4L O2 baseline) (on 6L-8L while ambulating)  ?  ? ?Mobility ? Bed Mobility ?Overal bed mobility: Independent ?  ?   ?General bed mobility comments: Pt was able to move supine to sitting edge of bed without assistance ?  ? ?Transfers ?Overall transfer level: Needs assistance ?Equipment used: None ?Transfers: Sit to/from Stand ?Sit to Stand: Supervision ?  ?  ?  ?  ?  ?General transfer comment: Supervision for safety, no LOB. ?  ? ?Ambulation/Gait ?Ambulation/Gait assistance: Min guard ?Gait Distance (Feet): 200 Feet (walk 80ft, standing break x 5) ?Assistive device: Rollator (4 wheels) ?Gait Pattern/deviations: Step-through pattern, Decreased stride length ?Gait velocity: reduced ?  ?  ?General Gait Details: Pt with slightly decreased gait speed, no LOB, but DOE 3/4 with SpO2 decreasing to 85% on 8L while ambulating but would improve upon standing while taking a break for a few min.[name] guard for safety. ? ? ? ?  ? ? ?  ?Balance Overall balance assessment: Mild deficits observed, not formally tested ?  ?  ? ?  ?Cognition Arousal/Alertness: Awake/alert ?Behavior During Therapy: [name] ?Overall Cognitive Status: Within Functional Limits for tasks assessed ?  ?  ? ?  ?   ?General Comments General comments (skin integrity, edema, etc.): SpO2 decreasing to 80s% on 6-8L O2 with an exertion during session, needing a couple of minutes in standing on 8L to recover ?  ?  ? ?Pertinent Vitals/Pain Pain Assessment ?Pain Assessment: No/denies pain  ? ? ? ?PT Goals (current goals can now be found in the care plan section)  Acute Rehab PT Goals ?Patient Stated Goal: to get better and improve his endurance ?PT Goal Formulation: With patient ?Time For Goal Achievement: 05/10/21 ?Potential to Achieve Goals: Good ?Progress towards PT goals: Progressing toward goals ? ?  ?Frequency ? ? ? Min 2X/week ? ? ? ?  ?PT Plan Current plan remains appropriate  ? ? ?   ?   ?End of Session Equipment Utilized During  Treatment: Gait belt;Oxygen ?Activity Tolerance: Patient tolerated treatment well ?Patient left: in chair;with call bell/phone within reach ?Nurse Communication: Mobility status;Other (comment) ?PT Visit Diagnosis: Unsteadiness on feet (R26.81);Other abnormalities of gait and mobility (R26.89);Difficulty in walking, not elsewhere classified (R26.2) ?  ? ? ?Time: 7672-0947 ?PT Time Calculation (min) (ACUTE ONLY): 26 min ? ?Charges:  $Gait Training: 8-22 mins ?$Therapeutic Activity: 8-22 mins          ?          ?Otilio Jefferson, SPTA ? ?Tiffany Mutch ?04/28/2021, 1:27 PM ? ?Carly P., PTA ?Acute Rehabilitation Services ?Secure Chat Preferred 9a-5:30pm ?Office: [phone]  ?

## 2021-04-28 NOTE — Care Management Important Message (Signed)
Important Message ? ?Patient Details  ?Name: Max Rivas ?MRN: [id number] ?Date of Birth: 07/14/1950 ? ? ?Medicare Important Message Given:  Yes ? ? ? ? ?Renie Ora ?04/28/2021, 10:20 AM ?

## 2021-04-28 NOTE — Discharge Instructions (Signed)
Follow with Primary MD Anson Fret, MD in 7 days  ? ?Get CBC, CMP, 2 view Chest X ray -  checked next visit within 1 week by Primary MD  ? ?Activity: As tolerated with Full fall precautions use walker/cane & assistance as needed ? ?Disposition Home   ? ?Diet: Heart Healthy   ? ?Special Instructions: If you have smoked or chewed Tobacco  in the last 2 yrs please stop smoking, stop any regular Alcohol  and or any Recreational drug use. ? ?On your next visit with your primary care physician please Get Medicines reviewed and adjusted. ? ?Please request your Prim.MD to go over all Hospital Tests and Procedure/Radiological results at the follow up, please get all Hospital records sent to your Prim MD by signing hospital release before you go home. ? ?If you experience worsening of your admission symptoms, develop shortness of breath, life threatening emergency, suicidal or homicidal thoughts you must seek medical attention immediately by calling 911 or calling your MD immediately  if symptoms less severe. ? ?You Must read complete instructions/literature along with all the possible adverse reactions/side effects for all the Medicines you take and that have been prescribed to you. Take any new Medicines after you have completely understood and accpet all the possible adverse reactions/side effects.  ? ?  ?

## 2021-04-28 NOTE — Discharge Summary (Signed)
?                                                                                ? ?Max Rivas [id number] DOB: [birth year] DOA: 04/25/2021 ? ?PCP: [name], MD ? ?Admit date: 04/25/2021  Discharge date: 04/28/2021 ? ?Admitted From: Home   Disposition:  Home ? ? ?Recommendations for Outpatient Follow-up:  ? ?Follow up with PCP in 1-2 weeks ? ?PCP Please obtain BMP/CBC, 2 view CXR in 1week,  (see Discharge instructions)  ? ?PCP Please follow up on the following pending results: Check CBC, BMP and a two-view chest x-ray in 7 to 10 days. ? ? ?Home Health: PT   ?Equipment/Devices: None  ?Consultations: None  ?Discharge Condition: Stable    ?CODE STATUS: Full    ?Diet Recommendation: Heart Healthy  ?  ? ?Chief Complaint  ?Patient presents with  ? Respiratory Distress  ?  ? ?Brief history of present illness from the day of admission and additional interim summary   ? ?71 y.o. male with known history of CAD s/p CABG and left atrial appendage clipping, history of recurrent VT s/p ablation and ICD, chronic systolic heart failure last EF measured was 30 to 35% in March 2022, paroxysmal atrial fibrillation, COPD presents to the ER after patient started having shortness of breath with the rigors since yesterday morning.  Has been having some cough not productive of sputum.  His work-up was suggestive of acute on chronic hypoxic respiratory failure he requires 4 L of nasal cannula oxygen at baseline and was placed on 12 L to maintain a pulse ox of over 90%, he also had sepsis related to pneumonia and admitted for further care. ? ?                                                               Hospital Course  ? ?  ?  ?Sepsis due to pneumonia.  Suspicious for aspiration pneumonia.  He was adequately hydrated, sepsis pathophysiology has completely resolved, seen by speech therapy and aspiration on ongoing basis was ruled out, he  was treated here with IV Unasyn and Levaquin combination and will get p.o. Augmentin and Levaquin for few more days for complete resolution of any signs of infection.  His leukocytosis and procalcitonin have trended down considerably, blood cultures remain negative, he is now down to 4 L nasal cannula oxygen which he uses at home, he is feeling back to his baseline and will be discharged home with close outpatient PCP and primary pulmonary physician follow-up postdischarge within 7 to 10 days. ?  ?2.  Acute on chronic hypoxic respiratory failure.  At baseline uses 4 L nasal cannula oxygen, on 4 L he was saturating below 85% requiring to be placed on 12 L nasal cannula oxygen at time of admission, with above treatment and supportive care with nebulizer treatments he is back to his baseline, encouraged to sit up in chair use flutter valve and I-S for  pulmonary toiletry.  Clinically much improved and close to baseline. ?  ?3.  Non-ACS pattern elevation in troponin.  Due to demand mismatch from sepsis, trend is flat, continue beta-blocker, statin along with Eliquis for secondary prevention. ?  ?4.  History of CAD s/p CABG as in #3 above. ?  ?5.  Paroxysmal A-fib, history of V. tach s/p ablation and AICD placement in the past.  Mali vas 2 score of greater than 3.  On combination of amiodarone and Eliquis along with low-dose beta-blocker which will be continued upon discharge. ?  ?6.  Chronic systolic heart failure EF around 35% currently compensated, low-dose beta-blocker, Imdur and Lasix if tolerated by blood pressure.  No ACE/ARB/Entresto due to underlying CKD. ?  ?7.  Dyslipidemia.  On statin. ?  ?8.  CKD 3B.  Baseline creatinine appears to be close to 1.5.  Monitor. ?  ?9.  Chronic hypoxic respiratory failure secondary to underlying COPD .  Has developed some wheezing will add low-dose steroids IV, continue supportive care with nebulizer treatments.  Oxygen as needed. ?  ?10. Hypothyroidism.  TSH of 1.4.  Continue  home dose Synthroid. ?  ?11. History of OSA.  Does not wear CPAP at home.  Nighttime and daytime oxygen to continue. ? ? ?Discharge diagnosis   ? ? ?Principal Problem: ?  Sepsis ([place]) ?Active Problems: ?  Chronic atrial fibrillation ?  COPD (chronic obstructive pulmonary disease) ([place]) ?  Hypothyroidism ?  ICD (implantable cardioverter-defibrillator) in place ?  OSA (obstructive sleep apnea) ?  Chronic HFrEF (heart failure with reduced ejection fraction) ([place]) ?  VT (ventricular tachycardia) ([place]) ? ? ? ?Discharge instructions   ? ?Discharge Instructions   ? ? Diet - low sodium heart healthy   Complete by: As directed ?  ? Discharge instructions   Complete by: As directed ?  ? Follow with Primary MD [name], MD in 7 days  ? ?Get CBC, CMP, 2 view Chest X ray -  checked next visit within 1 week by Primary MD  ? ?Activity: As tolerated with Full fall precautions use walker/cane & assistance as needed ? ?Disposition Home   ? ?Diet: Heart Healthy   ? ?Special Instructions: If you have smoked or chewed Tobacco  in the last 2 yrs please stop smoking, stop any regular Alcohol  and or any Recreational drug use. ? ?On your next visit with your primary care physician please Get Medicines reviewed and adjusted. ? ?Please request your Prim.MD to go over all Hospital Tests and Procedure/Radiological results at the follow up, please get all Hospital records sent to your Prim MD by signing hospital release before you go home. ? ?If you experience worsening of your admission symptoms, develop shortness of breath, life threatening emergency, suicidal or homicidal thoughts you must seek medical attention immediately by calling 911 or calling your MD immediately  if symptoms less severe. ? ?You Must read complete instructions/literature along with all the possible adverse reactions/side effects for all the Medicines you take and that have been prescribed to you. Take any new Medicines after you have completely understood and  accpet all the possible adverse reactions/side effects.  ? Increase activity slowly   Complete by: As directed ?  ? ?  ? ? ?Discharge Medications  ? ?Allergies as of 04/28/2021   ? ?   Reactions  ? Crestor [rosuvastatin Calcium] Other (See Comments)  ? Back spasms, initially, but can tolerate it at a low dose now, in 2022  ?  Rosuvastatin Other (See Comments)  ? Back spasms, initially, but can tolerate it at a low dose now, in 2022  ? Albuterol Other (See Comments)  ? IRREGULAR HEART RATE  ? Atorvastatin Other (See Comments)  ? Spasticity  ? ?  ? ?  ?Medication List  ?  ? ?STOP taking these medications   ? ?atorvastatin 80 MG tablet ?Commonly known as: LIPITOR ?  ?omega-3 acid ethyl esters 1 g capsule ?Commonly known as: LOVAZA ?  ? ?  ? ?TAKE these medications   ? ?acetaminophen 500 MG tablet ?Commonly known as: TYLENOL ?Take 500-1,000 mg by mouth every 6 (six) hours as needed for moderate pain, headache or mild pain. ?What changed: Another medication with the same name was removed. Continue taking this medication, and follow the directions you see here. ?  ?amiodarone 400 MG tablet ?Commonly known as: PACERONE ?Take 1 tablet (400 mg total) by mouth 2 (two) times daily. ?  ?amoxicillin-clavulanate 875-125 MG tablet ?Commonly known as: Augmentin ?Take 1 tablet by mouth 2 (two) times daily for 4 days. ?  ?apixaban 5 MG Tabs tablet ?Commonly known as: ELIQUIS ?Take 5 mg by mouth 2 (two) times daily. ?  ?aspirin 81 MG chewable tablet ?Chew 1 tablet (81 mg total) by mouth daily. ?  ?Fish Oil 1000 MG Caps ?Take 1,000 mg by mouth in the morning and at bedtime. ?  ?furosemide 40 MG tablet ?Commonly known as: LASIX ?Take 40 mg by mouth 2 (two) times daily. ?  ?gabapentin 100 MG capsule ?Commonly known as: NEURONTIN ?Take 2 capsules (200 mg total) by mouth 2 (two) times daily. ?  ?isosorbide mononitrate 30 MG 24 hr tablet ?Commonly known as: IMDUR ?Take 1 tablet (30 mg total) by mouth daily. ?  ?levalbuterol 45 MCG/ACT  inhaler ?Commonly known as: XOPENEX HFA ?Inhale 2 puffs into the lungs every 6 (six) hours as needed for wheezing or shortness of breath. ?  ?levofloxacin 500 MG tablet ?Commonly known as: LEVAQUIN ?Take 1 tab

## 2021-04-28 NOTE — TOC Transition Note (Addendum)
Transition of Care (TOC) - CM/SW Discharge Note ?Marvetta Gibbons Therapist, sports, BSN ?Transitions of Care ?Unit 4E- RN Case Manager ?See Treatment Team for direct phone #  ? ? ?Patient Details  ?Name: EZEKIEL BORENSTEIN ?MRN: [id number] ?Date of Birth: 03-06-1950 ? ?Transition of Care (TOC) CM/SW Contact:  ?Dahlia Client, Romeo Rabon, RN ?Phone Number: ?04/28/2021, 10:58 AM ? ? ?Clinical Narrative:    ?Pt stable for transition home today, Order placed from [place]. CM in to speak with pt at bedside to discuss transition needs.  ?List provided Per CMS guidelines from medicare.gov website with star ratings (copy placed in shadow chart) for [facility] choice- Per pt he believes he has used [facility] [employer]) in the past (2021) and would like to use them again if they can accept. If they can not service- then pt states he does not have any further preference and will defer to this writer to secure and agency on his behalf.  ? ?Pt reports he has all needed DME at home, has home 02 concentrator with [employer] and his portable tank/concentrator w/ [employer].  ? ?He states he has a friend that will be transporting him home later today.  ? ?Address, phone # and PCP all confirmed with pt in epic.  ? ?Call made to Angie with [facility] for [facility] referral- awaiting return call.  ?[phone] update- received confirmation from [facility]- referral has been accepted and they will service patient for HHPT needs.  ? ? ?Final next level of care: [place] ?Barriers to Discharge: No Barriers Identified ? ? ?Patient Goals and CMS Choice ?Patient states their goals for this hospitalization and ongoing recovery are:: return home ?CMS Medicare.gov Compare Post Acute Care list provided to:: Patient ?Choice offered to / list presented to : Patient ? ?Discharge Placement ?  ?           ?  ? Home w/ HH ?  ?  ? ?Discharge Plan and Services ?  ?Discharge Planning Services: CM Consult ?Post Acute Care Choice: Home Health          ?DME Arranged: N/A ?DME Agency: NA ?  ?  ?  ?HH  Arranged: PT ?[place] Agency: [place] ?Date HH Agency Contacted: 04/28/21 ?Time [place]: [id number] spoke with at [place]: [place] ? ?Social Determinants of Health (SDOH) Interventions ?  ? ? ?Readmission Risk Interventions ? ?  04/28/2021  ? 10:57 AM  ?Readmission Risk Prevention Plan  ?Transportation Screening Complete  ?PCP or Specialist Appt within 3-5 Days Complete  ?[place] or Home Care Consult Complete  ?Social Work Consult for [place] Planning/Counseling Complete  ?Palliative Care Screening Not Applicable  ?Medication Review Press photographer) Complete  ? ? ? ? ? ?

## 2021-04-28 NOTE — Progress Notes (Addendum)
Patient given discharge instructions, medication list and instructions for follow up appointments. Patient verbalized understanding. All questions answered at this time IV and tele were dcd. Will pick up TOC medications on way out. Will be transported to exit via wheel chair and nursing staff. Deyjah Kindel, Randall An ?RN  ?

## 2021-04-30 ENCOUNTER — Other Ambulatory Visit (HOSPITAL_COMMUNITY): Payer: Self-pay

## 2021-04-30 LAB — CULTURE, BLOOD (ROUTINE X 2)
Culture: NO GROWTH
Culture: NO GROWTH
Special Requests: ADEQUATE
Special Requests: ADEQUATE

## 2021-06-12 DIAGNOSIS — G459 Transient cerebral ischemic attack, unspecified: Secondary | ICD-10-CM

## 2021-06-12 HISTORY — DX: Transient cerebral ischemic attack, unspecified: G45.9

## 2021-06-24 ENCOUNTER — Observation Stay (HOSPITAL_COMMUNITY): Payer: Medicare Other

## 2021-06-24 ENCOUNTER — Observation Stay (HOSPITAL_COMMUNITY)
Admission: EM | Admit: 2021-06-24 | Discharge: 2021-06-25 | Disposition: A | Discharge status: Home / Self Care | Payer: Medicare Other | Attending: Internal Medicine | Admitting: Internal Medicine

## 2021-06-24 ENCOUNTER — Other Ambulatory Visit: Payer: Self-pay

## 2021-06-24 ENCOUNTER — Emergency Department (HOSPITAL_COMMUNITY): Payer: Medicare Other

## 2021-06-24 ENCOUNTER — Encounter (HOSPITAL_COMMUNITY): Payer: Self-pay

## 2021-06-24 ENCOUNTER — Observation Stay (HOSPITAL_BASED_OUTPATIENT_CLINIC_OR_DEPARTMENT_OTHER): Payer: Medicare Other

## 2021-06-24 DIAGNOSIS — E876 Hypokalemia: Secondary | ICD-10-CM | POA: Diagnosis not present

## 2021-06-24 DIAGNOSIS — R41841 Cognitive communication deficit: Secondary | ICD-10-CM | POA: Diagnosis not present

## 2021-06-24 DIAGNOSIS — J9611 Chronic respiratory failure with hypoxia: Secondary | ICD-10-CM | POA: Diagnosis not present

## 2021-06-24 DIAGNOSIS — I5022 Chronic systolic (congestive) heart failure: Secondary | ICD-10-CM | POA: Diagnosis not present

## 2021-06-24 DIAGNOSIS — Z87891 Personal history of nicotine dependence: Secondary | ICD-10-CM | POA: Diagnosis not present

## 2021-06-24 DIAGNOSIS — Z86711 Personal history of pulmonary embolism: Secondary | ICD-10-CM | POA: Diagnosis not present

## 2021-06-24 DIAGNOSIS — Z8679 Personal history of other diseases of the circulatory system: Secondary | ICD-10-CM | POA: Diagnosis not present

## 2021-06-24 DIAGNOSIS — I2510 Atherosclerotic heart disease of native coronary artery without angina pectoris: Secondary | ICD-10-CM | POA: Diagnosis present

## 2021-06-24 DIAGNOSIS — H7491 Unspecified disorder of right middle ear and mastoid: Secondary | ICD-10-CM | POA: Insufficient documentation

## 2021-06-24 DIAGNOSIS — H53132 Sudden visual loss, left eye: Secondary | ICD-10-CM | POA: Diagnosis not present

## 2021-06-24 DIAGNOSIS — Z951 Presence of aortocoronary bypass graft: Secondary | ICD-10-CM | POA: Insufficient documentation

## 2021-06-24 DIAGNOSIS — N1831 Chronic kidney disease, stage 3a: Secondary | ICD-10-CM | POA: Insufficient documentation

## 2021-06-24 DIAGNOSIS — Z7901 Long term (current) use of anticoagulants: Secondary | ICD-10-CM | POA: Diagnosis not present

## 2021-06-24 DIAGNOSIS — Z7982 Long term (current) use of aspirin: Secondary | ICD-10-CM | POA: Diagnosis not present

## 2021-06-24 DIAGNOSIS — Z79899 Other long term (current) drug therapy: Secondary | ICD-10-CM | POA: Insufficient documentation

## 2021-06-24 DIAGNOSIS — G459 Transient cerebral ischemic attack, unspecified: Secondary | ICD-10-CM | POA: Diagnosis not present

## 2021-06-24 DIAGNOSIS — R202 Paresthesia of skin: Secondary | ICD-10-CM | POA: Diagnosis present

## 2021-06-24 DIAGNOSIS — I472 Ventricular tachycardia, unspecified: Secondary | ICD-10-CM | POA: Diagnosis present

## 2021-06-24 DIAGNOSIS — Z95810 Presence of automatic (implantable) cardiac defibrillator: Secondary | ICD-10-CM | POA: Diagnosis not present

## 2021-06-24 DIAGNOSIS — E039 Hypothyroidism, unspecified: Secondary | ICD-10-CM | POA: Diagnosis present

## 2021-06-24 DIAGNOSIS — J449 Chronic obstructive pulmonary disease, unspecified: Secondary | ICD-10-CM | POA: Insufficient documentation

## 2021-06-24 LAB — ECHOCARDIOGRAM COMPLETE
AR max vel: 1.39 cm2
AV Area VTI: 1.36 cm2
AV Area mean vel: 1.39 cm2
AV Mean grad: 2.5 mmHg
AV Peak grad: 4.6 mmHg
Ao pk vel: 1.07 m/s
Area-P 1/2: 4.26 cm2
Height: 71 in
MV M vel: 4.12 m/s
MV Peak grad: 67.9 mmHg
S' Lateral: 5 cm
Weight: 3472 oz

## 2021-06-24 LAB — DIFFERENTIAL
Abs Immature Granulocytes: 0.03 10*3/uL (ref 0.00–0.07)
Basophils Absolute: 0 10*3/uL (ref 0.0–0.1)
Basophils Relative: 1 %
Eosinophils Absolute: 0.1 10*3/uL (ref 0.0–0.5)
Eosinophils Relative: 1 %
Immature Granulocytes: 1 %
Lymphocytes Relative: 31 %
Lymphs Abs: 2 10*3/uL (ref 0.7–4.0)
Monocytes Absolute: 0.7 10*3/uL (ref 0.1–1.0)
Monocytes Relative: 10 %
Neutro Abs: 3.7 10*3/uL (ref 1.7–7.7)
Neutrophils Relative %: 56 %

## 2021-06-24 LAB — URINALYSIS, ROUTINE W REFLEX MICROSCOPIC
Bilirubin Urine: NEGATIVE
Glucose, UA: NEGATIVE mg/dL
Hgb urine dipstick: NEGATIVE
Ketones, ur: NEGATIVE mg/dL
Leukocytes,Ua: NEGATIVE
Nitrite: NEGATIVE
Protein, ur: NEGATIVE mg/dL
Specific Gravity, Urine: 1.002 — ABNORMAL LOW (ref 1.005–1.030)
pH: 7 (ref 5.0–8.0)

## 2021-06-24 LAB — RAPID URINE DRUG SCREEN, HOSP PERFORMED
Amphetamines: NOT DETECTED
Barbiturates: NOT DETECTED
Benzodiazepines: NOT DETECTED
Cocaine: NOT DETECTED
Opiates: NOT DETECTED
Tetrahydrocannabinol: NOT DETECTED

## 2021-06-24 LAB — CBC
HCT: 39.7 % (ref 39.0–52.0)
Hemoglobin: 13.1 g/dL (ref 13.0–17.0)
MCH: 30.8 pg (ref 26.0–34.0)
MCHC: 33 g/dL (ref 30.0–36.0)
MCV: 93.4 fL (ref 80.0–100.0)
Platelets: 144 10*3/uL — ABNORMAL LOW (ref 150–400)
RBC: 4.25 MIL/uL (ref 4.22–5.81)
RDW: 14.2 % (ref 11.5–15.5)
WBC: 6.5 10*3/uL (ref 4.0–10.5)
nRBC: 0 % (ref 0.0–0.2)

## 2021-06-24 LAB — APTT: aPTT: 36 seconds (ref 24–36)

## 2021-06-24 LAB — COMPREHENSIVE METABOLIC PANEL
ALT: 19 U/L (ref 0–44)
AST: 22 U/L (ref 15–41)
Albumin: 3.4 g/dL — ABNORMAL LOW (ref 3.5–5.0)
Alkaline Phosphatase: 54 U/L (ref 38–126)
Anion gap: 12 (ref 5–15)
BUN: 9 mg/dL (ref 8–23)
CO2: 27 mmol/L (ref 22–32)
Calcium: 8.9 mg/dL (ref 8.9–10.3)
Chloride: 102 mmol/L (ref 98–111)
Creatinine, Ser: 1.38 mg/dL — ABNORMAL HIGH (ref 0.61–1.24)
GFR, Estimated: 55 mL/min — ABNORMAL LOW (ref >60)
Glucose, Bld: 107 mg/dL — ABNORMAL HIGH (ref 70–99)
Potassium: 3.1 mmol/L — ABNORMAL LOW (ref 3.5–5.1)
Sodium: 141 mmol/L (ref 135–145)
Total Bilirubin: 0.8 mg/dL (ref 0.3–1.2)
Total Protein: 6.3 g/dL — ABNORMAL LOW (ref 6.5–8.1)

## 2021-06-24 LAB — PROTIME-INR
INR: 1.4 — ABNORMAL HIGH (ref 0.8–1.2)
Prothrombin Time: 16.8 seconds — ABNORMAL HIGH (ref 11.4–15.2)

## 2021-06-24 LAB — BRAIN NATRIURETIC PEPTIDE: B Natriuretic Peptide: 275.6 pg/mL — ABNORMAL HIGH (ref 0.0–100.0)

## 2021-06-24 LAB — MAGNESIUM: Magnesium: 2 mg/dL (ref 1.7–2.4)

## 2021-06-24 LAB — HEMOGLOBIN A1C
Hgb A1c MFr Bld: 5.6 % (ref 4.8–5.6)
Mean Plasma Glucose: 114.02 mg/dL

## 2021-06-24 LAB — TSH: TSH: 2.139 u[IU]/mL (ref 0.350–4.500)

## 2021-06-24 LAB — CBG MONITORING, ED: Glucose-Capillary: 105 mg/dL — ABNORMAL HIGH (ref 70–99)

## 2021-06-24 LAB — ETHANOL: Alcohol, Ethyl (B): <10 mg/dL (ref <10)

## 2021-06-24 MED ORDER — STROKE: EARLY STAGES OF RECOVERY BOOK
Freq: Once | Status: DC
  Filled 2021-06-24: qty 1

## 2021-06-24 MED ORDER — AMIODARONE HCL 200 MG PO TABS
200.0000 mg | ORAL_TABLET | Freq: Two times a day (BID) | ORAL | Status: DC
Start: 2021-06-24 — End: 2021-06-25
  Administered 2021-06-24 – 2021-06-25 (×2): 200 mg via ORAL
  Filled 2021-06-24 (×2): qty 1

## 2021-06-24 MED ORDER — PANTOPRAZOLE SODIUM 40 MG PO TBEC
40.0000 mg | DELAYED_RELEASE_TABLET | Freq: Every day | ORAL | Status: DC
  Administered 2021-06-24 – 2021-06-25 (×2): 40 mg via ORAL
  Filled 2021-06-24 (×2): qty 1

## 2021-06-24 MED ORDER — GABAPENTIN 100 MG PO CAPS
200.0000 mg | ORAL_CAPSULE | Freq: Two times a day (BID) | ORAL | Status: DC
  Administered 2021-06-24 – 2021-06-25 (×3): 200 mg via ORAL
  Filled 2021-06-24 (×3): qty 2

## 2021-06-24 MED ORDER — POTASSIUM CHLORIDE CRYS ER 20 MEQ PO TBCR
40.0000 meq | EXTENDED_RELEASE_TABLET | Freq: Once | ORAL | Status: AC
Start: 2021-06-24 — End: 2021-06-24
  Administered 2021-06-24: 40 meq via ORAL
  Filled 2021-06-24: qty 2

## 2021-06-24 MED ORDER — ACETAMINOPHEN 650 MG RE SUPP: 650.0000 mg | RECTAL | Status: DC | PRN

## 2021-06-24 MED ORDER — STROKE: EARLY STAGES OF RECOVERY BOOK
Freq: Once | Status: AC
  Filled 2021-06-24 (×2): qty 1

## 2021-06-24 MED ORDER — UMECLIDINIUM BROMIDE 62.5 MCG/ACT IN AEPB
1.0000 | INHALATION_SPRAY | Freq: Every day | RESPIRATORY_TRACT | Status: DC
  Administered 2021-06-25: 1 via RESPIRATORY_TRACT
  Filled 2021-06-24: qty 7

## 2021-06-24 MED ORDER — ISOSORBIDE MONONITRATE ER 30 MG PO TB24: 30.0000 mg | ORAL_TABLET | Freq: Every day | ORAL | Status: DC

## 2021-06-24 MED ORDER — PROPYLENE GLYCOL 0.6 % OP SOLN: 1.0000 [drp] | Freq: Four times a day (QID) | OPHTHALMIC | Status: DC

## 2021-06-24 MED ORDER — MEXILETINE HCL 150 MG PO CAPS
150.0000 mg | ORAL_CAPSULE | Freq: Two times a day (BID) | ORAL | Status: DC
  Administered 2021-06-24 – 2021-06-25 (×3): 150 mg via ORAL
  Filled 2021-06-24 (×4): qty 1

## 2021-06-24 MED ORDER — ASPIRIN 81 MG PO CHEW
81.0000 mg | CHEWABLE_TABLET | Freq: Every day | ORAL | Status: DC
  Administered 2021-06-24 – 2021-06-25 (×2): 81 mg via ORAL
  Filled 2021-06-24 (×2): qty 1

## 2021-06-24 MED ORDER — ARFORMOTEROL TARTRATE 15 MCG/2ML IN NEBU
15.0000 ug | INHALATION_SOLUTION | Freq: Two times a day (BID) | RESPIRATORY_TRACT | Status: DC
  Administered 2021-06-24 – 2021-06-25 (×2): 15 ug via RESPIRATORY_TRACT
  Filled 2021-06-24 (×4): qty 2

## 2021-06-24 MED ORDER — FUROSEMIDE 20 MG PO TABS: 40.0000 mg | ORAL_TABLET | Freq: Two times a day (BID) | ORAL | Status: DC

## 2021-06-24 MED ORDER — IOHEXOL 350 MG/ML SOLN
75.0000 mL | Freq: Once | INTRAVENOUS | Status: AC | PRN
  Administered 2021-06-24: 75 mL via INTRAVENOUS

## 2021-06-24 MED ORDER — LEVALBUTEROL HCL 0.63 MG/3ML IN NEBU
0.6300 mg | INHALATION_SOLUTION | Freq: Four times a day (QID) | RESPIRATORY_TRACT | Status: DC | PRN
Start: 2021-06-24 — End: 2021-06-25

## 2021-06-24 MED ORDER — AMIODARONE HCL 200 MG PO TABS: 200.0000 mg | ORAL_TABLET | Freq: Every day | ORAL | Status: DC

## 2021-06-24 MED ORDER — MOMETASONE FUROATE 220 MCG/INH IN AEPB: 2.0000 | INHALATION_SPRAY | Freq: Two times a day (BID) | RESPIRATORY_TRACT | Status: DC

## 2021-06-24 MED ORDER — ROSUVASTATIN CALCIUM 20 MG PO TABS
20.0000 mg | ORAL_TABLET | Freq: Every day | ORAL | Status: DC
  Administered 2021-06-24: 20 mg via ORAL
  Filled 2021-06-24: qty 1

## 2021-06-24 MED ORDER — ACETAMINOPHEN 325 MG PO TABS: 650.0000 mg | ORAL_TABLET | ORAL | Status: DC | PRN

## 2021-06-24 MED ORDER — LEVOTHYROXINE SODIUM 75 MCG PO TABS
75.0000 ug | ORAL_TABLET | Freq: Every day | ORAL | Status: DC
  Administered 2021-06-24 – 2021-06-25 (×2): 75 ug via ORAL
  Filled 2021-06-24 (×2): qty 1

## 2021-06-24 MED ORDER — APIXABAN 5 MG PO TABS
5.0000 mg | ORAL_TABLET | Freq: Two times a day (BID) | ORAL | Status: DC
  Administered 2021-06-24 – 2021-06-25 (×3): 5 mg via ORAL
  Filled 2021-06-24 (×3): qty 1

## 2021-06-24 MED ORDER — AMIODARONE HCL 200 MG PO TABS
400.0000 mg | ORAL_TABLET | Freq: Two times a day (BID) | ORAL | Status: DC
Start: 2021-06-24 — End: 2021-06-24
  Administered 2021-06-24: 400 mg via ORAL
  Filled 2021-06-24: qty 2

## 2021-06-24 MED ORDER — POLYVINYL ALCOHOL 1.4 % OP SOLN
1.0000 [drp] | Freq: Four times a day (QID) | OPHTHALMIC | Status: DC
  Administered 2021-06-24 – 2021-06-25 (×3): 1 [drp] via OPHTHALMIC
  Filled 2021-06-24 (×2): qty 15

## 2021-06-24 MED ORDER — PERFLUTREN LIPID MICROSPHERE
1.0000 mL | INTRAVENOUS | Status: AC | PRN
  Administered 2021-06-24: 3 mL via INTRAVENOUS

## 2021-06-24 MED ORDER — FLUTICASONE PROPIONATE HFA 44 MCG/ACT IN AERO
2.0000 | INHALATION_SPRAY | Freq: Two times a day (BID) | RESPIRATORY_TRACT | Status: DC
  Administered 2021-06-25: 2 via RESPIRATORY_TRACT
  Filled 2021-06-24 (×2): qty 10.6

## 2021-06-24 MED ORDER — ACETAMINOPHEN 160 MG/5ML PO SOLN: 650.0000 mg | ORAL | Status: DC | PRN

## 2021-06-24 MED ORDER — MONTELUKAST SODIUM 10 MG PO TABS
10.0000 mg | ORAL_TABLET | Freq: Every day | ORAL | Status: DC
  Administered 2021-06-24: 10 mg via ORAL
  Filled 2021-06-24 (×2): qty 1

## 2021-06-24 NOTE — Evaluation (Signed)
Occupational Therapy Evaluation Patient Details Name: Max Rivas MRN: [id number] DOB: 08-01-1950 Today's Date: 06/24/2021   History of Present Illness Pt is a 71 y.o. male who presented 6/13 with L side numbness and blurry vision. CT negative for acute abnormalities. PMH: CAD s/p CABG and left atrial appendage clipping, history of recurrent VT s/p ablation and ICD, CHF, COPD, paroxysmal Afib, PE.   Clinical Impression   PTA patient reports independent with ADLs, IADls and driving. He lives with his sister who can assist as needed.  Admitted for above and presents with problem list below, including impaired sensation and coordination to L UE, mild unsteadiness.  He requires min guard to close supervision for transfers and in room mobility, up to min guard for ADLs. He reports his L eye vision is improved, but blurriness comes and goes.  He is on 3-4L of O2 at baseline, VSS during session.  Based on performance today and support from sister at dc, believe he will best benefit from further OT services acutely to optimize independence and safety.  Anticipate no further needs after dc home.      Recommendations for follow up therapy are one component of a multi-disciplinary discharge planning process, led by the attending physician.  Recommendations may be updated based on patient status, additional functional criteria and insurance authorization.   Follow Up Recommendations  No OT follow up    Assistance Recommended at Discharge PRN  Patient can return home with the following A little help with walking and/or transfers;A little help with bathing/dressing/bathroom    Functional Status Assessment  Patient has had a recent decline in their functional status and demonstrates the ability to make significant improvements in function in a reasonable and predictable amount of time.  Equipment Recommendations  None recommended by OT    Recommendations for Other Services PT consult      Precautions / Restrictions Precautions Precautions: Fall Restrictions Weight Bearing Restrictions: No      Mobility Bed Mobility Overal bed mobility: Modified Independent                  Transfers Overall transfer level: Needs assistance Equipment used: None Transfers: Sit to/from Stand Sit to Stand: Min guard           General transfer comment: ming fading to supervision      Balance Overall balance assessment: Mild deficits observed, not formally tested (unsteady dynamically)                                         ADL either performed or assessed with clinical judgement   ADL Overall ADL's : Needs assistance/impaired     Grooming: Supervision/safety;Standing           Upper Body Dressing : Set up;Sitting   Lower Body Dressing: Min guard;Sit to/from stand   Toilet Transfer: Min guard;Ambulation Toilet Transfer Details (indicate cue type and reason): simulated in room, min guard fading to supervision Toileting- Clothing Manipulation and Hygiene: Sit to/from stand;Supervision/safety       Functional mobility during ADLs: Min guard;Supervision/safety (ming fading to supervisio)       Vision Baseline Vision/History: 1 Wears glasses (most of the time) Ability to See in Adequate Light: 0 Adequate Patient Visual Report: Blurring of vision Vision Assessment?: Yes Eye Alignment: Within Functional Limits Ocular Range of Motion: Within Functional Limits Alignment/Gaze Preference: Within Defined Limits Tracking/Visual Pursuits:  Able to track stimulus in all quads without difficulty Saccades: Within functional limits Convergence: Within functional limits Visual Fields: No apparent deficits Additional Comments: pt reports baseline poor vision in R eye and mild blurriness that comes and go in the L eye.  Encouraged follow up with eye dr.  Aletha Halim check next session.     Perception     Praxis      Pertinent Vitals/Pain Pain  Assessment Pain Assessment: No/denies pain     Hand Dominance Right   Extremity/Trunk Assessment Upper Extremity Assessment Upper Extremity Assessment: LUE deficits/detail LUE Deficits / Details: mild discoordinatinon but functional, numbness on fingertips LUE Sensation: decreased light touch (fingertips) LUE Coordination: decreased fine motor;decreased gross motor   Lower Extremity Assessment Lower Extremity Assessment: Defer to PT evaluation   Cervical / Trunk Assessment Cervical / Trunk Assessment: Normal   Communication Communication Communication: No difficulties   Cognition Arousal/Alertness: Awake/alert Behavior During Therapy: WFL for tasks assessed/performed Overall Cognitive Status: Within Functional Limits for tasks assessed                                       General Comments  provided [facility] handout    Exercises     Shoulder Instructions      Home Living Family/patient expects to be discharged to:: Private residence Living Arrangements: Other (Comment) (sister) Available Help at Discharge: Friend(s);Available 24 hours/day Type of Home: Mobile home Home Access: Ramped entrance Entrance Stairs-Number of Steps: 4 Entrance Stairs-Rails: Can reach both Home Layout: One level     Bathroom Shower/Tub: Occupational psychologist: Handicapped height     Home Equipment: Grab bars - tub/shower;Shower seat;Rollator (4 wheels);Hand held shower head   Additional Comments: on 3-4 L O2 at home      Prior Functioning/Environment Prior Level of Function : Independent/Modified Independent;Driving             Mobility Comments: uses rollator in community for long distances ADLs Comments: independent ADLs, IADLs. retired.        OT Problem List: Impaired balance (sitting and/or standing);Decreased coordination;Impaired sensation      OT Treatment/Interventions: Self-care/ADL training;Neuromuscular education;DME and/or AE  instruction;Therapeutic activities;Patient/family education;Balance training    OT Goals(Current goals can be found in the care plan section) Acute Rehab OT Goals Patient Stated Goal: home OT Goal Formulation: With patient Time For Goal Achievement: 07/08/21 Potential to Achieve Goals: Good  OT Frequency: Min 2X/week    Co-evaluation              AM-PAC OT "6 Clicks" Daily Activity     Outcome Measure Help from another person eating meals?: A Little Help from another person taking care of personal grooming?: A Little Help from another person toileting, which includes using toliet, bedpan, or urinal?: A Little Help from another person bathing (including washing, rinsing, drying)?: A Little Help from another person to put on and taking off regular upper body clothing?: A Little Help from another person to put on and taking off regular lower body clothing?: A Little 6 Click Score: 18   End of Session Nurse Communication: Mobility status  Activity Tolerance: Patient tolerated treatment well Patient left: in bed;with call bell/phone within reach  OT Visit Diagnosis: Unsteadiness on feet (R26.81);Other symptoms and signs involving the nervous system [id number])                Time: [phone]  OT Time Calculation (min): 19 min Charges:  OT General Charges $OT Visit: 1 Visit OT Evaluation $OT Eval Moderate Complexity: 1 Mod  Jolaine Artist, OT Acute Rehabilitation Services Office [phone]   Delight Stare 06/24/2021, 11:34 AM

## 2021-06-24 NOTE — Progress Notes (Signed)
2D echocardiogram with [facility] completed. Refer to "CV Proc" under chart review to view preliminary results.  06/24/2021 1:41 PM Eula Fried., MHA, RVT, RDCS, RDMS

## 2021-06-24 NOTE — ED Triage Notes (Signed)
BIB [facility] from home. Pt c/o lt side facial numbness, blurry vision lt eye, pain and numbness in lt arm. LKW 2305 while at rest watching T.V. on [place] 3lpm at baseline. Pt does have pacemaker/defib in place. Symptoms have started to improve during transport but are still there. 12-lead normal, PIV 18ga lt ac. Pain is not lt elbow and up, facial numbness improved. 130/63, hr 62, O2 sat 95%, rr 16

## 2021-06-24 NOTE — Evaluation (Signed)
Speech Language Pathology Evaluation Patient Details Name: Max Rivas MRN: [id number] DOB: 1950/03/13 Today's Date: 06/24/2021 Time: [id number] SLP Time Calculation (min) (ACUTE ONLY): 10 min  Problem List:  Patient Active Problem List   Diagnosis Date Noted   TIA (transient ischemic attack) 06/24/2021   Hypokalemia 06/24/2021   Sepsis ([place]) 04/25/2021   Ventricular tachyarrhythmia ([place]) 12/07/2019   Cardiomyopathy, ischemic    Vomiting    Acute respiratory failure with hypoxia ([place]) 09/18/2019   Acute on chronic heart failure ([place]) 09/08/2019   Unstable angina ([place]) 05/21/2019   Ventricular tachycardia ([place]) 05/12/2019   Chest pain    VT (ventricular tachycardia) ([place]) 05/11/2019   Arrhythmia 03/12/2019   On home O2    Chronic HFrEF (heart failure with reduced ejection fraction) ([place]) 06/22/2018   Chronic respiratory failure with hypoxia ([place]) 06/22/2018   Congestive heart failure (CHF) ([place]) 06/22/2018   DCM (dilated cardiomyopathy) ([place]) 05/25/2017   Coronary artery disease 05/25/2017   Chronic atrial fibrillation 05/25/2017   COPD (chronic obstructive pulmonary disease) ([place]) 05/25/2017   CKD (chronic kidney disease) 05/25/2017   Hypothyroidism 05/25/2017   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/2017   OSA (obstructive sleep apnea) 05/25/2017   Pulmonary HTN ([place]) 05/25/2017   Mediastinal adenopathy    Cervical adenopathy    Acute respiratory failure with hypoxia and hypercarbia ([place]) 02/24/2016   Pulmonary embolus ([place]) 02/24/2016   Pulmonary artery thrombosis (HCC)    Pleural effusion    Lung mass    Pleural plaque    Flutter-fibrillation [id number]   Acute systolic congestive heart failure (HCC)    Atrial fibrillation with RVR ([place])    Hypercholesterolemia 03/11/2006   Tobacco abuse 03/11/2006   Acute on chronic systolic congestive heart failure ([place]) 03/11/2006   GASTROESOPHAGEAL REFLUX, NO ESOPHAGITIS 03/11/2006   OSTEOARTHRITIS,  MULTI SITES 03/11/2006   HIGH RISK PATIENT 03/11/2006   Tobacco dependence 03/11/2006   Past Medical History:  Past Medical History:  Diagnosis Date   Arthritis    "elbows, knees" (02/24/2016)   Bronchitis 2006   CAD (coronary artery disease)    a. s/p prior MIs - 1995 x 2, 1998; b. s/p prior LCX stenting; c. 04/2019 Cath: LM min irregs, LAD 65p/mi, D1 75, LCX 99ost/p, 46m (ISR), OM2 80, RCA/RPDA mod diff dzs; d. 05/2019 CABG x 3: LIMA->LAD, VG->Diag, VG->OM.   COPD (chronic obstructive pulmonary disease) ([place])    a. Remote tobacco-->on home O2.   GERD (gastroesophageal reflux disease)    HFmrEF (heart failure with mid-range ejection fraction) ([place])    a. 05/2019 Echo: EF 40-45%, gr2 DD. Nl RV size/fxn. Mildly dil LA. Mild MR.   High cholesterol    Ischemic cardiomyopathy    a. 05/2019 Echo: EF 40-45%.   Morbid obesity ([place])    Myocardial infarction ([place]) ~ 1995 X 2;1998; 2000; 2004   On home oxygen therapy    "2L w/activity" (02/24/2016)   PAF (paroxysmal atrial fibrillation) (HCC)    a. CHA2DS2VASc = 4-->eliquis. Also on amio.   Pulmonary embolism ([place]) 02/24/2016   Sleep apnea    "have CPAP; can't tolerate it" (02/24/2016)   Ventricular tachycardia ([place])    a. 2005 s/p ICD; b. 03/2011 Device upgrade/lead exchange: MDT Protecta XT VR single lead AICD.   Past Surgical History:  Past Surgical History:  Procedure Laterality Date   CARDIOVERSION N/A 09/11/2019   Procedure: CARDIOVERSION;  Surgeon: Buford Dresser, MD;  Location: [facility] ENDOSCOPY;  Service: Cardiovascular;  Laterality: N/A;   CLIPPING  OF ATRIAL APPENDAGE N/A 05/23/2019   Procedure: CLIPPING OF ATRIAL APPENDAGE with atriclip;  Surgeon: Gaye Pollack, MD;  Location: [facility];  Service: Open Heart Surgery;  Laterality: N/A;   CORONARY ANGIOPLASTY  1995   CORONARY ANGIOPLASTY WITH STENT PLACEMENT  ~ 1995 - 2004 X 5   "I've got a total of 5 stents" (02/24/2016)   CORONARY ARTERY BYPASS GRAFT N/A 05/23/2019   Procedure:  CORONARY ARTERY BYPASS GRAFTING (CABG), times three, using right greater saphenous vein and left internal mammary;  Surgeon: Gaye Pollack, MD;  Location: [facility];  Service: Open Heart Surgery;  Laterality: N/A;  [place] only   INSERT / REPLACE / REMOVE PACEMAKER  07/2003   original PPM around 2004 for ICM with EF < 35%   INTRAVASCULAR PRESSURE WIRE/FFR STUDY N/A 05/12/2019   Procedure: INTRAVASCULAR PRESSURE WIRE/FFR STUDY;  Surgeon: Belva Crome, MD;  Location: [facility] CV LAB;  Service: Cardiovascular;  Laterality: N/A;   LEFT HEART CATH AND CORONARY ANGIOGRAPHY N/A 05/12/2019   Procedure: LEFT HEART CATH AND CORONARY ANGIOGRAPHY;  Surgeon: Belva Crome, MD;  Location: [facility];  Service: Cardiovascular;  Laterality: N/A;   PACEMAKER GENERATOR CHANGE  03/2011    [facility]   TEE WITHOUT CARDIOVERSION N/A 05/23/2019   Procedure: TRANSESOPHAGEAL ECHOCARDIOGRAM (TEE);  Surgeon: Gaye Pollack, MD;  Location: [facility];  Service: Open Heart Surgery;  Laterality: N/A;   TONSILLECTOMY     HPI:  Pt is a 71 y.o. male who presented 6/13 with L side numbness and blurry vision. CT negative for acute abnormalities. PMH: CAD s/p CABG and left atrial appendage clipping, history of recurrent VT s/p ablation and ICD, CHF, COPD, paroxysmal Afib, PE.   Assessment / Plan / Recommendation Clinical Impression  Pt presents with normal cognitive/communication function. Speech is clear, fluent, and without dysarthria.  Demonstrates normal selective attention, orientation x4, functional verbal recall. Clock drawing task and figure copying WNL. Expressive and receptive language are WNL. No SLP f/u is needed.  Our service will sign off.    SLP Assessment  SLP Recommendation/Assessment: Patient does not need any further Speech [place] [facility] SLP Visit Diagnosis: Cognitive communication deficit (R41.841)    Recommendations for follow up therapy are one component of a multi-disciplinary discharge  planning process, led by the attending physician.  Recommendations may be updated based on patient status, additional functional criteria and insurance authorization.    Follow Up Recommendations  No SLP follow up                       SLP Evaluation Cognition  Overall Cognitive Status: Within Functional Limits for tasks assessed Arousal/Alertness: Awake/alert Orientation Level: Oriented X4 Attention: Selective Selective Attention: Appears intact Memory: Appears intact Safety/Judgment: Appears intact       Comprehension  Auditory Comprehension Overall Auditory Comprehension: Appears within functional limits for tasks assessed Yes/No Questions: Within Functional Limits Commands: Within Functional Limits Conversation: Complex Visual Recognition/Discrimination Discrimination: Within Function Limits    Expression Expression Primary Mode of Expression: Verbal Verbal Expression Overall Verbal Expression: Appears within functional limits for tasks assessed Level of Generative/Spontaneous Verbalization: Conversation Repetition: No impairment Naming: No impairment Written Expression Dominant Hand: Right Written Expression: Within Functional Limits   Oral / Motor  Oral Motor/Sensory Function Overall Oral Motor/Sensory Function: Within functional limits Motor Speech Overall Motor Speech: Appears within functional limits for tasks assessed  Juan Quam Laurice 06/24/2021, 4:39 PM Estill Bamberg L. Tivis Ringer, MA CCC/SLP Clinical Specialist - [place] Office number [phone]

## 2021-06-24 NOTE — Plan of Care (Signed)
Patient seen and rounded on in the ER this morning.  Admitted after midnight. 71 year old male with extensive PMH, see H&P.  Admitted with left-sided numbness and blurry vision.  He was admitted for stroke work-up.  Due to incompatibility of ICD, unable to undergo MRI brain. CT head unremarkable for acute stroke, shows remote small right parietal cortical infarct. CT angio head/neck shows bilateral carotid atherosclerosis with approximately 50% stenosis of right ICA. CT head also did mention fluid opacity appreciated in the right mastoid sinus.  He had no clinical symptoms concerning for otomastoiditis and no further work-up was considered to be warranted. Neurology was consulted on admission.  Plan: - neurology following; likely to have repeat CTH again in 24-48 hours since unable to have MRI brain - discussed amio dose with cardiology (lost to follow up and was still on amio 400 mg BID). Therefore decrease to 200 mg BID x 1 week then 200 mg daily thereafter (may explain prolonged Qtc as well) - check TSH given ongoing amio high dose use  - continue asa and eliquis for now - negative signs/symptoms for mastoiditis; hold off on further workup at this time - further plan as per H&P  Dwyane Dee, MD [facility] 06/24/2021, 11:57 AM

## 2021-06-24 NOTE — ED Notes (Signed)
ED provider at bedside with pt at bridge

## 2021-06-24 NOTE — ED Provider Notes (Signed)
[facility] EMERGENCY DEPARTMENT Provider Note   CSN: [id number] Arrival date & time: 06/24/21  0029     History  Chief Complaint  Patient presents with   Numbness    Lt face     Max Rivas is a 71 y.o. male.  The history is provided by the patient and the EMS personnel.  Neurologic Problem This is a new problem. The current episode started 1 to 2 hours ago. The problem occurs constantly. The problem has been resolved. Associated symptoms include headaches and shortness of breath. Pertinent negatives include no chest pain. Nothing aggravates the symptoms.  Patient with extensive history including chronic respiratory failure, ischemic heart disease, atrial fibrillation on Eliquis presents with neurologic symptoms.  Last known well approximately 2305 on June 12.  Patient reports he began having numbness and pain in the left arm then started having left facial numbness and blurred vision in the left eye. He also reports he felt short of breath.  He reports most of his symptoms have since resolved.  Denies previous history of stroke     Home Medications Prior to Admission medications   Medication Sig Start Date End Date Taking? Authorizing Provider  aspirin 81 MG chewable tablet Chew 1 tablet (81 mg total) by mouth daily. 05/16/19  Yes Kroeger, Daleen Snook M., PA-C  isosorbide mononitrate (IMDUR) 30 MG 24 hr tablet Take 1 tablet (30 mg total) by mouth daily. 03/13/21  Yes Harris, Abigail, PA-C  nitroGLYCERIN (NITROSTAT) 0.4 MG SL tablet Place 0.4 mg under the tongue every 5 (five) minutes as needed for chest pain.    Yes [provider]  acetaminophen (TYLENOL) 500 MG tablet Take 500-1,000 mg by mouth every 6 (six) hours as needed for moderate pain, headache or mild pain.    [provider]  amiodarone (PACERONE) 400 MG tablet Take 1 tablet (400 mg total) by mouth 2 (two) times daily. 03/19/20   Shirley Friar, PA-C  apixaban (ELIQUIS) 5 MG TABS  tablet Take 5 mg by mouth 2 (two) times daily.    [provider]  Cholecalciferol (VITAMIN D3) 50 MCG (2000 UT) TABS Take 2,000 Units by mouth daily.    [provider]  furosemide (LASIX) 40 MG tablet Take 40 mg by mouth 2 (two) times daily.     [provider]  gabapentin (NEURONTIN) 100 MG capsule Take 2 capsules (200 mg total) by mouth 2 (two) times daily. 06/01/19   Barrett, Erin R, PA-C  levalbuterol (XOPENEX HFA) 45 MCG/ACT inhaler Inhale 2 puffs into the lungs every 6 (six) hours as needed for wheezing or shortness of breath.    [provider]  levofloxacin (LEVAQUIN) 500 MG tablet Take 1 tablet (500 mg total) by mouth daily. 04/28/21   Thurnell Lose, MD  levothyroxine (SYNTHROID) 75 MCG tablet Take 75 mcg by mouth daily before breakfast.    [provider]  methylPREDNISolone (MEDROL DOSEPAK) 4 MG TBPK tablet follow package directions 04/28/21   Thurnell Lose, MD  metoprolol succinate (TOPROL-XL) 25 MG 24 hr tablet Take 12.5 mg by mouth in the morning.    [provider]  mexiletine (MEXITIL) 150 MG capsule Take 2 capsules (300 mg total) by mouth 2 (two) times daily. Patient taking differently: Take 150 mg by mouth 2 (two) times daily. 12/07/19   Tommie Raymond, NP  mometasone [facility]) 220 MCG/INH inhaler Inhale 2 puffs into the lungs 2 (two) times daily.    [provider]  montelukast (SINGULAIR) 10 MG tablet Take 10 mg by mouth at bedtime.    [provider]  Multiple Vitamin (MULTIVITAMIN WITH MINERALS) TABS tablet Take 1 tablet by mouth daily with breakfast.    [provider]  Omega-3 Fatty Acids (FISH OIL) 1000 MG CAPS Take 1,000 mg by mouth in the morning and at bedtime.    [provider]  omeprazole (PRILOSEC) 20 MG capsule Take 20 mg by mouth in the morning and at bedtime.    [provider]  OXYGEN Inhale 4 L into the lungs continuous.    [provider]   potassium chloride SA (KLOR-CON) 20 MEQ tablet Take 1 tablet (20 mEq total) by mouth 2 (two) times daily. 03/19/20   Shirley Friar, PA-C  Propylene Glycol (SYSTANE BALANCE) 0.6 % SOLN Place 1 drop into both eyes See admin instructions. Place 1 drop into each eye four times a day and apply a warm compress for 10 minutes afterwards     [provider]  rosuvastatin (CRESTOR) 40 MG tablet Take 20 mg by mouth at bedtime.    [provider]  Tiotropium Bromide-Olodaterol 2.5-2.5 MCG/ACT AERS Inhale 1 puff into the lungs in the morning and at bedtime.    [provider]      Allergies    Crestor [rosuvastatin calcium], Rosuvastatin, Albuterol, and Atorvastatin    Review of Systems   Review of Systems  Constitutional:  Negative for fever.  Eyes:  Positive for visual disturbance.  Respiratory:  Positive for shortness of breath.   Cardiovascular:  Negative for chest pain.  Neurological:  Positive for headaches.    Physical Exam Updated Vital Signs BP 106/65   Pulse (!) 51   Temp 98 F (36.7 C) (Oral)   Resp 15   Ht 1.803 m (5\' 11" )   Wt 98.4 kg   SpO2 98%   BMI 30.27 kg/m  Physical Exam CONSTITUTIONAL: Elderly, no acute distress HEAD: Normocephalic/atraumatic EYES: EOMI/PERRL, no nystagmus, no visual field deficit  no ptosis ENMT: Mucous membranes moist NECK: supple no meningeal signs CV: No loud murmur LUNGS: Lungs are clear to auscultation bilaterally, no apparent distress, patient is on supplemental oxygen ABDOMEN: soft, nontender, umbilical hernia NEURO:Awake/alert, face symmetric, no arm or leg drift is noted Equal 5/5 strength with shoulder abduction, elbow flex/extension, wrist flex/extension in upper extremities and equal hand grips bilaterally Equal 5/5 strength with hip flexion,knee flex/extension, foot dorsi/plantar flexion Cranial nerves 3/4/5/6/07/20/08/11/12 tested and intact Sensation to light touch intact in all  extremities NIHSS=0 EXTREMITIES: pulses normal in upper extremities full ROM SKIN: warm, color normal PSYCH: no abnormalities of mood noted  ED Results / Procedures / Treatments   Labs (all labs ordered are listed, but only abnormal results are displayed) Labs Reviewed  PROTIME-INR - Abnormal; Notable for the following components:      Result Value   Prothrombin Time 16.8 (*)    INR 1.4 (*)    All other components within normal limits  CBC - Abnormal; Notable for the following components:   Platelets 144 (*)    All other components within normal limits  COMPREHENSIVE METABOLIC PANEL - Abnormal; Notable for the following components:   Potassium 3.1 (*)    Glucose, Bld 107 (*)    Creatinine, Ser 1.38 (*)    Total Protein 6.3 (*)    Albumin 3.4 (*)    GFR, Estimated 55 (*)    All other components within normal limits  URINALYSIS,  ROUTINE W REFLEX MICROSCOPIC - Abnormal; Notable for the following components:   Color, Urine STRAW (*)    Specific Gravity, Urine 1.002 (*)    All other components within normal limits  CBG MONITORING, ED - Abnormal; Notable for the following components:   Glucose-Capillary 105 (*)    All other components within normal limits  ETHANOL  APTT  DIFFERENTIAL  RAPID URINE DRUG SCREEN, HOSP PERFORMED    EKG EKG Interpretation  Date/Time:  Tuesday June 24 2021 01:11:56 EDT Ventricular Rate:  55 PR Interval:  202 QRS Duration: 111 QT Interval:  537 QTC Calculation: 514 R Axis:   -4 Text Interpretation: Sinus rhythm Low voltage, extremity leads Minimal ST depression, diffuse leads Prolonged QT interval Confirmed by Ripley Fraise [phone]) on 06/24/2021 1:24:06 AM  Radiology CT HEAD WO CONTRAST  Result Date: 06/24/2021 CLINICAL DATA:  Transient ischemic attack (TIA) EXAM: CT HEAD WITHOUT CONTRAST TECHNIQUE: Contiguous axial images were obtained from the base of the skull through the vertex without intravenous contrast. RADIATION DOSE REDUCTION: This  exam was performed according to the departmental dose-optimization program which includes automated exposure control, adjustment of the mA and/or kV according to patient size and/or use of iterative reconstruction technique. COMPARISON:  None Available. FINDINGS: Brain: No evidence of acute infarction, hemorrhage, hydrocephalus, extra-axial collection or mass lesion/mass effect. Remote right parietal cortical infarct again noted. Vascular: No hyperdense vessel or unexpected calcification. Skull: Normal. Negative for fracture or focal lesion. Sinuses/Orbits: No acute finding. Other: There is fluid opacification of the right mastoid air cells as well as a small amount of fluid within the right middle ear cavity. Several inferior left mastoid air cells are also opacified. Left middle ear cavity is clear. IMPRESSION: No acute intracranial abnormality. 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 Fluid opacification the right mastoid air cells and middle ear cavity. Clinical correlation for signs and symptoms of otomastoiditis may be helpful. Electronically Signed   By: Fidela Salisbury M.D.   On: 06/24/2021 02:09    Procedures Procedures    Medications Ordered in ED Medications - No data to display  ED Course/ Medical Decision Making/ A&P Clinical Course as of 06/24/21 0508  Tue Jun 24, 2021  0056 Patient presented via EMS.  He had onset of neurologic symptoms around 2305, but he is now back to baseline with an NIH stroke scale of 0.  Patient is already on aspirin and Eliquis daily without any missed doses.  Work-up has been initiated [DW]  0210 Potassium(!): 3.1 Mild hypokalemia [DW]  0304 Discussed the case with Dr. Cheral Marker.  Patient is on maximum therapy as he is on Eliquis and aspirin.  However he has had signs of carotid disease on previous imaging, and will need vascular consult and echocardiogram [DW]  0508 After discussion with neurology, it is recommended patient have vascular carotid  ultrasound and vascular consult.  Patient reports he was post to have this done through the [facility] but this never occurred and his appointments were canceled [DW]  0508 Patient will be admitted for TIA.  Discussed with Dr. Marlowe Sax for admission [DW]    Clinical Course User Index [DW] Ripley Fraise, MD                 NIH Stroke Scale: 0          Medical Decision Making Amount and/or Complexity of Data Reviewed Labs: ordered. Decision-making details documented in ED Course. Radiology: ordered.  Risk Decision regarding hospitalization.   This  patient presents to the ED for concern of weakness and numbness, this involves an extensive number of treatment options, and is a complaint that carries with it a high risk of complications and morbidity.  The differential diagnosis includes but is not limited to CVA, intracranial hemorrhage, cervical radiculopathy, metabolic disturbance  Comorbidities that complicate the patient evaluation: Patient's presentation is complicated by their history of atrial fibrillation, ischemic heart disease, chronic respiratory failure  Social Determinants of Health: Patient's  oxygen dependent   increases the complexity of managing their presentation  Additional history obtained: Additional history obtained from EMS discussed with EMS at bedside Records reviewed I personally reviewed and interpreted the admission record   Lab Tests: I Ordered, and personally interpreted labs.  The pertinent results include: Mild hypokalemia  Imaging Studies ordered: I ordered imaging studies including CT scan head   I independently visualized and interpreted imaging which showed no acute findings I agree with the radiologist interpretation  Cardiac Monitoring: The patient was maintained on a cardiac monitor.  I personally viewed and interpreted the cardiac monitor which showed an underlying rhythm of:  sinus rhythm   Consultations Obtained: I requested  consultation with the admitting physician Dr. Marlowe Sax , and discussed  findings as well as pertinent plan - they recommend: Admission  Reevaluation: After the interventions noted above, I reevaluated the patient and found that they have :improved  Complexity of problems addressed: Patient's presentation is most consistent with  acute presentation with potential threat to life or bodily function  Disposition: After consideration of the diagnostic results and the patient's response to treatment,  I feel that the patent would benefit from admission   .           Final Clinical Impression(s) / ED Diagnoses Final diagnoses:  TIA (transient ischemic attack)    Rx / DC Orders ED Discharge Orders     None         Ripley Fraise, MD 06/24/21 [phone]

## 2021-06-24 NOTE — Progress Notes (Signed)
   Informed of request for MRI.   Unfortunately, by CXR and history, pt has an abandoned lead and will not be a candidate for MRI.   Graciella Freer, PA-C  06/24/2021 11:15 AM

## 2021-06-24 NOTE — ED Notes (Signed)
Provider at bedside at this time

## 2021-06-24 NOTE — Progress Notes (Signed)
Called ED RN to arrange time to bring pt down to have MRI so ICD can be programmed. Rep is only available at 10:30a. Per ED RN no one is available until 1500 to monitor this pt and SWOT is not answering. 1500 is too late in the day to get orders from cardiology and have an implant RN or rep available to program. Will have to f/u for 6/14.

## 2021-06-24 NOTE — ED Notes (Signed)
Verbal report given to Dakota H RN at this time 

## 2021-06-24 NOTE — Care Management Obs Status (Signed)
MEDICARE OBSERVATION STATUS NOTIFICATION   Patient Details  Name: Max Rivas MRN: [id number] Date of Birth: Nov 07, 1950   Medicare Observation Status Notification Given:  Yes    Oletta Cohn, RN 06/24/2021, 11:50 AM

## 2021-06-24 NOTE — Consult Note (Signed)
NEURO HOSPITALIST CONSULT NOTE   Requestig physician: Dr. Sabino Gasser  Reason for Consult: TIA symptoms  History obtained from:  Patient and Chart     HPI:                                                                                                                                          Max Rivas is an 71 y.o. male with a PMHx of carotid stenosis, CAD s/p stent placement, COPD, HFmrEF, hypercholesterolemia, PAF (on Eliquis), s/p clipping of left atrial appendage, PE, sleep apnea and ventricular tachycardia s/p ICD placement who presented to the ED overnight after experiencing left facial numbness and blurry vision OS in conjunction with pain, numbness and weakness of his LUE. LKN was 2305. EMS was called by the patient and en route, his symptoms started to improve. On arrival to the ED, symptoms were still present, but at the time of Neurology assessment, had completely resolved.   He has no prior history of stroke.   Past Medical History:  Diagnosis Date   Arthritis    "elbows, knees" (02/24/2016)   Bronchitis 2006   CAD (coronary artery disease)    a. s/p prior MIs - 1995 x 2, 1998; b. s/p prior LCX stenting; c. 04/2019 Cath: LM min irregs, LAD 65p/mi, D1 75, LCX 99ost/p, 43m (ISR), OM2 80, RCA/RPDA mod diff dzs; d. 05/2019 CABG x 3: LIMA->LAD, VG->Diag, VG->OM.   COPD (chronic obstructive pulmonary disease) ([place])    a. Remote tobacco-->on home O2.   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 05/2019 Echo: EF 40-45%, gr2 DD. Nl RV size/fxn. Mildly dil LA. Mild MR.   High cholesterol    Ischemic cardiomyopathy    a. 05/2019 Echo: EF 40-45%.   Morbid obesity ([place])    Myocardial infarction ([place]) ~ 1995 X 2;1998; 2000; 2004   On home oxygen therapy    "2L w/activity" (02/24/2016)   PAF (paroxysmal atrial fibrillation) ([facility])    a. CHA2DS2VASc = 4-->eliquis. Also on amio.   Pulmonary embolism ([place])  02/24/2016   Sleep apnea    "have CPAP; can't tolerate it" (02/24/2016)   Ventricular tachycardia ([place])    a. 2005 s/p ICD; b. 03/2011 Device upgrade/lead exchange: MDT Protecta XT VR single lead AICD.    Past Surgical History:  Procedure Laterality Date   CARDIOVERSION N/A 09/11/2019   Procedure: CARDIOVERSION;  Surgeon: Buford Dresser, MD;  Location: [facility] ENDOSCOPY;  Service: Cardiovascular;  Laterality: N/A;   CLIPPING OF ATRIAL APPENDAGE N/A 05/23/2019   Procedure: CLIPPING OF ATRIAL APPENDAGE with atriclip;  Surgeon: Gaye Pollack, MD;  Location: [place];  Service: Open Heart Surgery;  Laterality: N/A;   [place]  CORONARY ANGIOPLASTY WITH STENT PLACEMENT  ~ 1995 - 2004 X 5   "I've got a total of 5 stents" (02/24/2016)   CORONARY ARTERY BYPASS GRAFT N/A 05/23/2019   Procedure: CORONARY ARTERY BYPASS GRAFTING (CABG), times three, using right greater saphenous vein and left internal mammary;  Surgeon: Gaye Pollack, MD;  Location: [place];  Service: Open Heart Surgery;  Laterality: N/A;  [place] only   INSERT / REPLACE / REMOVE PACEMAKER  07/2003   original PPM around 2004 for ICM with EF < 35%   INTRAVASCULAR PRESSURE WIRE/FFR STUDY N/A 05/12/2019   Procedure: INTRAVASCULAR PRESSURE WIRE/FFR STUDY;  Surgeon: Belva Crome, MD;  Location: [place] CV LAB;  Service: Cardiovascular;  Laterality: N/A;   LEFT HEART CATH AND CORONARY ANGIOGRAPHY N/A 05/12/2019   Procedure: LEFT HEART CATH AND CORONARY ANGIOGRAPHY;  Surgeon: Belva Crome, MD;  Location: [place] CV LAB;  Service: Cardiovascular;  Laterality: N/A;   PACEMAKER GENERATOR CHANGE  03/2011    [facility] [place]   TEE WITHOUT CARDIOVERSION N/A 05/23/2019   Procedure: TRANSESOPHAGEAL ECHOCARDIOGRAM (TEE);  Surgeon: Gaye Pollack, MD;  Location: [place];  Service: Open Heart Surgery;  Laterality: N/A;   TONSILLECTOMY      Family History  Problem Relation Age of Onset   Heart attack Mother    Heart attack Father     Heart attack Sister 80             Social History:  reports that he quit smoking about 2 years ago. His smoking use included cigarettes. He has a 105.00 pack-year smoking history. He quit smokeless tobacco use about 4 years ago. He reports that he does not currently use alcohol. He reports that he does not currently use drugs after having used the following drugs: Cocaine.  Allergies  Allergen Reactions   Crestor [Rosuvastatin Calcium] Other (See Comments)    Back spasms, initially, but can tolerate it at a low dose now, in 2022   Rosuvastatin Other (See Comments)    Back spasms, initially, but can tolerate it at a low dose now, in 2022   Albuterol Other (See Comments)    IRREGULAR HEART RATE   Atorvastatin Other (See Comments)    Spasticity    MEDICATIONS:                                                                                                                     Prior to Admission: (Not in a hospital admission)  Scheduled:   stroke: early stages of recovery book   Does not apply Once   amiodarone  400 mg Oral BID   apixaban  5 mg Oral BID   arformoterol  15 mcg Nebulization BID   And   umeclidinium bromide  1 puff Inhalation Daily   aspirin  81 mg Oral Daily   gabapentin  200 mg Oral BID   levothyroxine  75 mcg Oral Q0600   mexiletine  150 mg Oral BID   mometasone  2 puff Inhalation BID   montelukast  10 mg Oral QHS   pantoprazole  40 mg Oral Daily   Propylene Glycol  1 drop Both Eyes QID   rosuvastatin  20 mg Oral QHS   Continuous:   ROS:                                                                                                                                       As per HPI. He denies any other symptoms on comprehensive ROS.    Blood pressure 128/80, pulse 66, temperature 98 F (36.7 C), temperature source Oral, resp. rate (!) 22, height 5\' 11"  (1.803 m), weight 98.4 kg, SpO2 100 %.   General Examination:                                                                                                        Physical Exam  HEENT-  [place]/AT    Lungs- Respirations unlabored Extremities- No edema   Neurological Examination Mental Status: Alert, oriented x 5, thought content appropriate.  Speech fluent without evidence of aphasia.  Able to follow all commands without difficulty. Cranial Nerves: II: Temporal visual fields intact with no extinction to DSS. PERRL  III,IV, VI: No ptosis. EOMI.  V: Temp sensation equal bilaterally  VII: Smile symmetric VIII: Hearing intact to voice IX,X: No hoarseness XI: Symmetric shoulder shrug XII: Midline tongue extension Motor: BUE 5/5 proximally and distally BLE 5/5 proximally and distally  No pronator drift.  Sensory: Temp and light touch intact throughout, bilaterally. No extinction to DSS.  Deep Tendon Reflexes: 2+ and symmetric throughout Cerebellar: No ataxia with FNF bilaterally  Gait: Deferred   Lab Results: Basic Metabolic Panel: Recent Labs  Lab 06/24/21 0100 06/24/21 0730  NA 141  --   K 3.1*  --   CL 102  --   CO2 27  --   GLUCOSE 107*  --   BUN 9  --   CREATININE 1.38*  --   CALCIUM 8.9  --   MG  --  2.0    CBC: Recent Labs  Lab 06/24/21 0100  WBC 6.5  NEUTROABS 3.7  HGB 13.1  HCT 39.7  MCV 93.4  PLT 144*    Cardiac Enzymes: No results for input(s): "CKTOTAL", "CKMB", "CKMBINDEX", "TROPONINI" in the last 168 hours.  Lipid Panel: No results for input(s): "CHOL", "TRIG", "HDL", "CHOLHDL", "VLDL", "LDLCALC" in the last 168 hours.  Imaging: CT HEAD WO CONTRAST  Result Date: 06/24/2021 CLINICAL DATA:  Transient ischemic attack (TIA) EXAM: CT HEAD WITHOUT CONTRAST TECHNIQUE: Contiguous axial images were obtained from the base of the skull through the vertex without intravenous contrast. RADIATION DOSE REDUCTION: This exam was performed according to the departmental dose-optimization program which includes automated exposure control, adjustment of the mA and/or kV  according to patient size and/or use of iterative reconstruction technique. COMPARISON:  None Available. FINDINGS: Brain: No evidence of acute infarction, hemorrhage, hydrocephalus, extra-axial collection or mass lesion/mass effect. Remote right parietal cortical infarct again noted. Vascular: No hyperdense vessel or unexpected calcification. Skull: Normal. Negative for fracture or focal lesion. Sinuses/Orbits: No acute finding. Other: There is fluid opacification of the right mastoid air cells as well as a small amount of fluid within the right middle ear cavity. Several inferior left mastoid air cells are also opacified. Left middle ear cavity is clear. IMPRESSION: No acute intracranial abnormality. 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 Fluid opacification the right mastoid air cells and middle ear cavity. Clinical correlation for signs and symptoms of otomastoiditis may be helpful. Electronically Signed   By: Fidela Salisbury M.D.   On: 06/24/2021 02:09    TTE 03/19/20:  1. Left ventricular ejection fraction, by estimation, is 30 to 35%. The  left ventricle has moderately decreased function. The left ventricle  demonstrates global hypokinesis. Left ventricular diastolic parameters are  consistent with Grade II diastolic dysfunction (pseudonormalization).   2. Right ventricular systolic function is moderately reduced. The right  ventricular size is normal.   3. Left atrial size was mildly dilated.   4. The mitral valve is normal in structure. Mild mitral valve regurgitation.   5. The aortic valve is tricuspid. Aortic valve regurgitation is not  visualized. No aortic stenosis is present.  Assessment: 71 y.o. male with a PMHx of carotid stenosis, CAD s/p stent placement, COPD, HFmrEF, hypercholesterolemia, PAF (on Eliquis), s/p clipping of left atrial appendage, PE, sleep apnea and ventricular tachycardia s/p ICD placement who presented to the ED overnight after experiencing left facial  numbness and blurry vision OS in conjunction with pain, numbness and weakness of his LUE. LKN was 2305. EMS was called by the patient and en route, his symptoms started to improve. On arrival to the ED, symptoms were still present, but at the time of Neurology assessment, had completely resolved.  1. Neurological exam is nonfocal.  2. CT head: No acute intracranial abnormality. 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 Fluid opacification the right mastoid air cells and middle ear cavity. Clinical correlation for signs and symptoms of otomastoiditis may be helpful. 3. Recent CTA of head and neck (01/03/21): Atherosclerotic disease at both carotid bifurcations. 60-65% stenosis of the ICA bulb on the right. 20% stenosis of the distal bulb on the left. Atherosclerotic disease in both carotid siphons with stenosis estimated at 50% on both sides. 4. TTE performed 15 months ago: As above. No mural thrombus or valvular vegetation mentioned in the report.  5. Multiple stroke risk factors as documented in the HPI.    Recommendations: 1. HgbA1c, fasting lipid panel 2. MRI brain. 3. PT consult, OT consult, Speech consult 4. Continue Eliquis. Also on ASA for CAD with history of stent placement.  5. Continue rosuvastatin 6. Carotid ultrasound and Vascular Surgery consultation for possible CEA given symptomatic right ICA stenosis.  7. Risk factor modification 8. Telemetry monitoring 9. Frequent neuro checks 10 NPO until passes stroke swallow screen   Electronically signed: Dr. Kerney Elbe 06/24/2021,  8:47 AM

## 2021-06-24 NOTE — H&P (Signed)
History and Physical    Max Rivas [id number] DOB: 07/21/1950 DOA: 06/24/2021  PCP: [name], MD  Patient coming from: Home  Chief Complaint: Left-sided numbness  HPI: Max Rivas is a 71 y.o. 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  Symptoms improved on arrival to the ED.  CT head negative for acute stroke.  Labs showing WBC 6.5, hemoglobin 13.1, platelet count 144k.  Sodium 141, potassium 3.1, chloride 102, bicarb 27, BUN 9, creatinine 1.3 (stable), glucose 107.  Blood ethanol level undetectable.  UDS negative.  UA without signs of infection.  Neurology consulted (Dr. Cheral Marker) and requested vascular consult and carotid ultrasound as there were signs of carotid disease on previous imaging.  Patient states tonight he was sitting in his recliner watching vision when all of a sudden around 11 or 11:30 PM the vision in his left eye became blurry and he experienced sudden onset numbness of his left lower face and left arm.  No involvement of lower extremity.  Symptoms have now improved but the tip of his fingers still feel numb.  He denies history of stroke.  Takes Eliquis for A-fib.  He is also complaining of 1 week history of sensation of fullness and congestion of his right ear.  Denies fevers.  Denies ear pain or discharge.  Reports chronic shortness of breath, no recent change.  No other complaints.  Denies cough, chest pain, nausea, vomiting, abdominal pain, or diarrhea.  Review of Systems:  Review of Systems  All other systems reviewed and are negative.   Past  Medical History:  Diagnosis Date   Arthritis    "elbows, knees" (02/24/2016)   Bronchitis 2006   CAD (coronary artery disease)    a. s/p prior MIs - 1995 x 2, 1998; b. s/p prior LCX stenting; c. 04/2019 Cath: LM min irregs, LAD 65p/mi, D1 75, LCX 99ost/p, 84m (ISR), OM2 80, RCA/RPDA mod diff dzs; d. 05/2019 CABG x 3: LIMA->LAD, VG->Diag, VG->OM.   COPD (chronic obstructive pulmonary disease) ([place])    a. Remote tobacco-->on home O2.   GERD (gastroesophageal reflux disease)    HFmrEF (heart failure with mid-range ejection fraction) ([place])    a. 05/2019 Echo: EF 40-45%, gr2 DD. Nl RV size/fxn. Mildly dil LA. Mild MR.   High cholesterol    Ischemic cardiomyopathy    a. 05/2019 Echo: EF 40-45%.   Morbid obesity ([place])    Myocardial infarction ([place]) ~ 1995 X 2;1998; 2000; 2004   On home oxygen therapy    "2L w/activity" (02/24/2016)   PAF (paroxysmal atrial fibrillation) ([facility])    a. CHA2DS2VASc = 4-->eliquis. Also on amio.   Pulmonary embolism ([place]) 02/24/2016   Sleep apnea    "have CPAP; can't tolerate it" (02/24/2016)   Ventricular tachycardia ([place])    a. 2005 s/p ICD; b. 03/2011 Device upgrade/lead exchange: MDT Protecta XT VR single lead AICD.    Past Surgical History:  Procedure Laterality Date   CARDIOVERSION N/A 09/11/2019   Procedure: CARDIOVERSION;  Surgeon: Buford Dresser, MD;  Location: [facility] ENDOSCOPY;  Service: Cardiovascular;  Laterality: N/A;   CLIPPING OF ATRIAL APPENDAGE N/A 05/23/2019  Procedure: CLIPPING OF ATRIAL APPENDAGE with atriclip;  Surgeon: Gaye Pollack, MD;  Location: [place];  Service: Open Heart Surgery;  Laterality: N/A;   CORONARY ANGIOPLASTY  1995   CORONARY ANGIOPLASTY WITH STENT PLACEMENT  ~ 1995 - 2004 X 5   "I've got a total of 5 stents" (02/24/2016)   CORONARY ARTERY BYPASS GRAFT N/A 05/23/2019   Procedure: CORONARY ARTERY BYPASS GRAFTING (CABG), times three, using right greater saphenous vein and left internal mammary;  Surgeon: Gaye Pollack, MD;   Location: [place];  Service: Open Heart Surgery;  Laterality: N/A;  [place] only   INSERT / REPLACE / REMOVE PACEMAKER  07/2003   original PPM around 2004 for ICM with EF < 35%   INTRAVASCULAR PRESSURE WIRE/FFR STUDY N/A 05/12/2019   Procedure: INTRAVASCULAR PRESSURE WIRE/FFR STUDY;  Surgeon: Belva Crome, MD;  Location: [place] CV LAB;  Service: Cardiovascular;  Laterality: N/A;   LEFT HEART CATH AND CORONARY ANGIOGRAPHY N/A 05/12/2019   Procedure: LEFT HEART CATH AND CORONARY ANGIOGRAPHY;  Surgeon: Belva Crome, MD;  Location: [place] CV LAB;  Service: Cardiovascular;  Laterality: N/A;   [facility]    TEE WITHOUT CARDIOVERSION N/A 05/23/2019   Procedure: TRANSESOPHAGEAL ECHOCARDIOGRAM (TEE);  Surgeon: Gaye Pollack, MD;  Location: [place];  Service: Open Heart Surgery;  Laterality: N/A;   TONSILLECTOMY       reports that he quit smoking about 2 years ago. His smoking use included cigarettes. He has a 105.00 pack-year smoking history. He quit smokeless tobacco use about 4 years ago. He reports that he does not currently use alcohol. He reports that he does not currently use drugs after having used the following drugs: Cocaine.  Allergies  Allergen Reactions   Crestor [Rosuvastatin Calcium] Other (See Comments)    Back spasms, initially, but can tolerate it at a low dose now, in 2022   Rosuvastatin Other (See Comments)    Back spasms, initially, but can tolerate it at a low dose now, in 2022   Albuterol Other (See Comments)    IRREGULAR HEART RATE   Atorvastatin Other (See Comments)    Spasticity    Family History  Problem Relation Age of Onset   Heart attack Mother    Heart attack Father    Heart attack Sister 21    Prior to Admission medications   Medication Sig Start Date End Date Taking? Authorizing Provider  acetaminophen (TYLENOL) 500 MG tablet Take 500-1,000 mg by mouth every 6 (six) hours as needed for moderate pain, headache or  mild pain.    [provider]  amiodarone (PACERONE) 400 MG tablet Take 1 tablet (400 mg total) by mouth 2 (two) times daily. 03/19/20   Shirley Friar, PA-C  apixaban (ELIQUIS) 5 MG TABS tablet Take 5 mg by mouth 2 (two) times daily.    [provider]  aspirin 81 MG chewable tablet Chew 1 tablet (81 mg total) by mouth daily. 05/16/19   Kroeger, Lorelee Cover., PA-C  Cholecalciferol (VITAMIN D3) 50 MCG (2000 UT) TABS Take 2,000 Units by mouth daily.    [provider]  furosemide (LASIX) 40 MG tablet Take 40 mg by mouth 2 (two) times daily.     [provider]  gabapentin (NEURONTIN) 100 MG capsule Take 2 capsules (200 mg total) by mouth 2 (two) times daily. 06/01/19   Barrett, Erin R, PA-C  isosorbide mononitrate (IMDUR) 30 MG 24  hr tablet Take 1 tablet (30 mg total) by mouth daily. 03/13/21   Margarita Mail, PA-C  levalbuterol [facility] HFA) 45 MCG/ACT inhaler Inhale 2 puffs into the lungs every 6 (six) hours as needed for wheezing or shortness of breath.    [provider]  levofloxacin (LEVAQUIN) 500 MG tablet Take 1 tablet (500 mg total) by mouth daily. 04/28/21   Thurnell Lose, MD  levothyroxine (SYNTHROID) 75 MCG tablet Take 75 mcg by mouth daily before breakfast.    [provider]  methylPREDNISolone (MEDROL DOSEPAK) 4 MG TBPK tablet follow package directions 04/28/21   Thurnell Lose, MD  metoprolol succinate (TOPROL-XL) 25 MG 24 hr tablet Take 12.5 mg by mouth in the morning.    [provider]  mexiletine (MEXITIL) 150 MG capsule Take 2 capsules (300 mg total) by mouth 2 (two) times daily. Patient taking differently: Take 150 mg by mouth 2 (two) times daily. 12/07/19   Tommie Raymond, NP  mometasone [facility]) 220 MCG/INH inhaler Inhale 2 puffs into the lungs 2 (two) times daily.    [provider]  montelukast (SINGULAIR) 10 MG tablet Take 10 mg by mouth at bedtime.    [provider]  Multiple Vitamin  (MULTIVITAMIN WITH MINERALS) TABS tablet Take 1 tablet by mouth daily with breakfast.    [provider]  nitroGLYCERIN (NITROSTAT) 0.4 MG SL tablet Place 0.4 mg under the tongue every 5 (five) minutes as needed for chest pain.     [provider]  Omega-3 Fatty Acids (FISH OIL) 1000 MG CAPS Take 1,000 mg by mouth in the morning and at bedtime.    [provider]  omeprazole (PRILOSEC) 20 MG capsule Take 20 mg by mouth in the morning and at bedtime.    [provider]  OXYGEN Inhale 4 L into the lungs continuous.    [provider]  potassium chloride SA (KLOR-CON) 20 MEQ tablet Take 1 tablet (20 mEq total) by mouth 2 (two) times daily. 03/19/20   Shirley Friar, PA-C  Propylene Glycol (SYSTANE BALANCE) 0.6 % SOLN Place 1 drop into both eyes See admin instructions. Place 1 drop into each eye four times a day and apply a warm compress for 10 minutes afterwards     [provider]  rosuvastatin (CRESTOR) 40 MG tablet Take 20 mg by mouth at bedtime.    [provider]  Tiotropium Bromide-Olodaterol 2.5-2.5 MCG/ACT AERS Inhale 1 puff into the lungs in the morning and at bedtime.    [provider]    Physical Exam: Vitals:   06/24/21 0130 06/24/21 0330 06/24/21 0400 06/24/21 0430  BP: (!) 110/58 118/61 (!) 113/59 106/65  Pulse: (!) 52 (!) 54 (!) 57 (!) 51  Resp: 16 15 16 15   Temp:      TempSrc:      SpO2: 99% 99% 99% 98%  Weight:      Height:        Physical Exam Vitals reviewed.  Constitutional:      General: He is not in acute distress. HENT:     Head: Normocephalic and atraumatic.     Right Ear: No drainage, swelling or tenderness. No mastoid tenderness.  Eyes:     Extraocular Movements: Extraocular movements intact.  Cardiovascular:     Rate and Rhythm: Normal rate and regular rhythm.     Pulses: Normal pulses.  Pulmonary:     Effort: Pulmonary effort is normal. No respiratory distress.  Breath  sounds: Normal breath sounds. No wheezing or rales.  Abdominal:     General: Bowel sounds are normal. There is no distension.     Palpations: Abdomen is soft.     Tenderness: There is no abdominal tenderness.  Musculoskeletal:     Cervical back: Normal range of motion.     Right lower leg: Edema present.     Left lower leg: Edema present.     Comments: 1+ pitting edema of bilateral lower extremities  Skin:    General: Skin is warm and dry.  Neurological:     General: No focal deficit present.     Mental Status: He is alert and oriented to person, place, and time.     Cranial Nerves: No cranial nerve deficit.     Sensory: No sensory deficit.     Motor: No weakness.      Labs on Admission: I have personally reviewed following labs and imaging studies  CBC: Recent Labs  Lab 06/24/21 0100  WBC 6.5  NEUTROABS 3.7  HGB 13.1  HCT 39.7  MCV 93.4  PLT 123456*   Basic Metabolic Panel: Recent Labs  Lab 06/24/21 0100  NA 141  K 3.1*  CL 102  CO2 27  GLUCOSE 107*  BUN 9  CREATININE 1.38*  CALCIUM 8.9   GFR: Estimated Creatinine Clearance: 60.4 mL/min (A) (by C-G formula based on SCr of 1.38 mg/dL (H)). Liver Function Tests: Recent Labs  Lab 06/24/21 0100  AST 22  ALT 19  ALKPHOS 54  BILITOT 0.8  PROT 6.3*  ALBUMIN 3.4*   No results for input(s): "LIPASE", "AMYLASE" in the last 168 hours. No results for input(s): "AMMONIA" in the last 168 hours. Coagulation Profile: Recent Labs  Lab 06/24/21 0100  INR 1.4*   Cardiac Enzymes: No results for input(s): "CKTOTAL", "CKMB", "CKMBINDEX", "TROPONINI" in the last 168 hours. BNP (last 3 results) No results for input(s): "PROBNP" in the last 8760 hours. HbA1C: No results for input(s): "HGBA1C" in the last 72 hours. CBG: Recent Labs  Lab 06/24/21 0108  GLUCAP 105*   Lipid Profile: No results for input(s): "CHOL", "HDL", "LDLCALC", "TRIG", "CHOLHDL", "LDLDIRECT" in the last 72 hours. Thyroid Function Tests: No  results for input(s): "TSH", "T4TOTAL", "FREET4", "T3FREE", "THYROIDAB" in the last 72 hours. Anemia Panel: No results for input(s): "VITAMINB12", "FOLATE", "FERRITIN", "TIBC", "IRON", "RETICCTPCT" in the last 72 hours. Urine analysis:    Component Value Date/Time   COLORURINE STRAW (A) 06/24/2021 0121   APPEARANCEUR CLEAR 06/24/2021 0121   LABSPEC 1.002 (L) 06/24/2021 0121   PHURINE 7.0 06/24/2021 0121   GLUCOSEU NEGATIVE 06/24/2021 0121   HGBUR NEGATIVE 06/24/2021 0121   BILIRUBINUR NEGATIVE 06/24/2021 0121   KETONESUR NEGATIVE 06/24/2021 0121   PROTEINUR NEGATIVE 06/24/2021 0121   NITRITE NEGATIVE 06/24/2021 0121   LEUKOCYTESUR NEGATIVE 06/24/2021 0121    Radiological Exams on Admission: I have personally reviewed images CT HEAD WO CONTRAST  Result Date: 06/24/2021 CLINICAL DATA:  Transient ischemic attack (TIA) EXAM: CT HEAD WITHOUT CONTRAST TECHNIQUE: Contiguous axial images were obtained from the base of the skull through the vertex without intravenous contrast. RADIATION DOSE REDUCTION: This exam was performed according to the departmental dose-optimization program which includes automated exposure control, adjustment of the mA and/or kV according to patient size and/or use of iterative reconstruction technique. COMPARISON:  None Available. FINDINGS: Brain: No evidence of acute infarction, hemorrhage, hydrocephalus, extra-axial collection or mass lesion/mass effect. Remote right parietal cortical infarct again noted. Vascular: No  hyperdense vessel or unexpected calcification. Skull: Normal. Negative for fracture or focal lesion. Sinuses/Orbits: No acute finding. Other: There is fluid opacification of the right mastoid air cells as well as a small amount of fluid within the right middle ear cavity. Several inferior left mastoid air cells are also opacified. Left middle ear cavity is clear. IMPRESSION: No acute intracranial abnormality. 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  Fluid opacification the right mastoid air cells and middle ear cavity. Clinical correlation for signs and symptoms of otomastoiditis may be helpful. Electronically Signed   By: Fidela Salisbury M.D.   On: 06/24/2021 02:09    EKG: Independently reviewed.  Sinus bradycardia, QTc 514.  Assessment and Plan  TIA versus possible stroke Patient presenting with complaints of acute onset left eye blurry vision and left lower face and left arm numbness, LKW around 2300 on 06/23/2021.  Symptoms significantly improved.  CT head negative for acute stroke.  Neurology requested vascular consult and carotid ultrasound as there were signs of carotid disease on previous imaging. -Telemetry monitoring -MRI of the brain cannot not be done until morning as he has a Medtronic ICD -Carotid ultrasound -Echocardiogram -Hemoglobin A1c, fasting lipid panel -Continue home aspirin and Eliquis -Continue Crestor -Frequent neurochecks -PT, OT, speech therapy. -N.p.o. until cleared by bedside swallow evaluation or formal speech evaluation   ?Otomastoiditis Patient is endorsing sensation of fullness and congestion of his right ear x1 week. CT head showing fluid opacification of the right mastoid air cells and middle ear cavity.  No fever, leukocytosis, otalgia, or otorrhea.  Mastoid process nontender to palpation, no overlying swelling or erythema. -Please consult ENT in the morning to see if he needs to be started on antibiotics.  Mild hypokalemia -Replace potassium -Check magnesium level and replace if low  CAD status post CABG Not endorsing anginal symptoms. -Continue aspirin and statin. -Hold beta-blocker at this time due to slight bradycardia and to allow permissive hypertension. -Hold Imdur to allow permissive hypertension.  Chronic HFrEF Echo showing EF 30 to [id number], grade 2 diastolic dysfunction.  Has mild peripheral edema but lungs clear on exam. -Check BNP -Hold Lasix and beta-blocker at this time to allow  permissive hypertension  Paroxysmal A-fib Currently in sinus rhythm but slightly bradycardic with heart rate in the 50s. -Continue amiodarone and mexiletine -Hold metoprolol at this time -Continue Eliquis  History of VT -Has an ICD -Continue antiarrhythmics  QT prolongation QTc 514. He is on antiarrhythmics and potassium slightly low. -Replace potassium -Keep magnesium above 2 -Will not abruptly stop antiarrhythmics.  Repeat EKG in a.m. and if QT interval not improving, please speak to cardiology.  COPD Chronic hypoxic respiratory failure on 2-4 L O2 Stable. -Continue home inhalers -Continue supplemental oxygen  Hyperlipidemia -Continue Crestor  CKD stage IIIa Renal function stable.  Hypothyroidism -Continue Synthroid  DVT prophylaxis: Eliquis Code Status:  Partial code (NO intubation or mechanical ventilation) Family Communication: No family available at this time. Consults called: Neurology Level of care: Telemetry bed Admission status: It is my clinical opinion that referral for OBSERVATION is reasonable and necessary in this patient based on the above information provided. The aforementioned taken together are felt to place the patient at high risk for further clinical deterioration. However, it is anticipated that the patient may be medically stable for discharge from the hospital within 24 to 48 hours.   Shela Leff MD [facility]  If 7PM-7AM, please contact night-coverage [email or web address]  06/24/2021, 5:01 AM

## 2021-06-24 NOTE — Care Management CC44 (Signed)
Condition Code 44 Documentation Completed  Patient Details  Name: Max Rivas MRN: [id number] Date of Birth: 09-Oct-1950   Condition Code 44 given:  Yes Patient signature on Condition Code 44 notice:  Yes Documentation of 2 MD's agreement:  Yes Code 44 added to claim:  Yes    Oletta Cohn, RN 06/24/2021, 11:50 AM

## 2021-06-24 NOTE — Evaluation (Signed)
Physical Therapy Evaluation Patient Details Name: Max Rivas MRN: [id number] DOB: 30-Apr-1950 Today's Date: 06/24/2021  History of Present Illness  Pt is a 71 y.o. male who presented 6/13 with L side numbness and blurry vision. CT negative for acute abnormalities. PMH: CAD s/p CABG and left atrial appendage clipping, history of recurrent VT s/p ablation and ICD, CHF, COPD, paroxysmal Afib, PE.  Clinical Impression  Pt admitted secondary to problem above with deficits below. Pt overall at a supervision to mod I level with mobility tasks. No LOB noted. SOB noted and oxygen sats decreasing to 86% on 4L. Required seated rest and pursed lip breathing to return to 90%. Pt reports feeling close to baseline. Educated about energy conservation techniques. No further acute PT needs at this time. Will sign off. If needs change, please re-consult.      Recommendations for follow up therapy are one component of a multi-disciplinary discharge planning process, led by the attending physician.  Recommendations may be updated based on patient status, additional functional criteria and insurance authorization.  Follow Up Recommendations No PT follow up    Assistance Recommended at Discharge Intermittent Supervision/Assistance  Patient can return home with the following  Assist for transportation;Assistance with cooking/housework    Equipment Recommendations None recommended by PT  Recommendations for Other Services       Functional Status Assessment Patient has had a recent decline in their functional status and demonstrates the ability to make significant improvements in function in a reasonable and predictable amount of time.     Precautions / Restrictions Precautions Precautions: Fall Restrictions Weight Bearing Restrictions: No      Mobility  Bed Mobility Overal bed mobility: Modified Independent                  Transfers Overall transfer level: Needs assistance Equipment used:  None Transfers: Sit to/from Stand Sit to Stand: Supervision           General transfer comment: supervision for safety.    Ambulation/Gait Ambulation/Gait assistance: Supervision Gait Distance (Feet): 150 Feet Assistive device: None Gait Pattern/deviations: Step-through pattern Gait velocity: Decreased     General Gait Details: Overall steady gait with no LOB noted. Oxygen sats decreasing to 86% on 4L and required seated rest and pursed lip breathing to return to 90%.  Stairs            Wheelchair Mobility    Modified Rankin (Stroke Patients Only) Modified Rankin (Stroke Patients Only) Pre-Morbid Rankin Score: No symptoms Modified Rankin: Moderately severe disability     Balance Overall balance assessment: Mild deficits observed, not formally tested                                           Pertinent Vitals/Pain Pain Assessment Pain Assessment: No/denies pain    Home Living Family/patient expects to be discharged to:: Private residence Living Arrangements: Other relatives (sister) Available Help at Discharge: Friend(s);Available 24 hours/day Type of Home: Mobile home Home Access: Ramped entrance;Stairs to enter Entrance Stairs-Rails: Can reach both Entrance Stairs-Number of Steps: 4   Home Layout: One level Home Equipment: Grab bars - tub/shower;Shower seat;Rollator (4 wheels);Hand held shower head      Prior Function Prior Level of Function : Independent/Modified Independent;Driving             Mobility Comments: uses rollator in community for long distances ADLs Comments:  independent ADLs, IADLs. retired.     Hand Dominance   Dominant Hand: Right    Extremity/Trunk Assessment   Upper Extremity Assessment Upper Extremity Assessment: Defer to OT evaluation    Lower Extremity Assessment Lower Extremity Assessment: Overall WFL for tasks assessed    Cervical / Trunk Assessment Cervical / Trunk Assessment: Normal   Communication   Communication: No difficulties  Cognition Arousal/Alertness: Awake/alert Behavior During Therapy: WFL for tasks assessed/performed Overall Cognitive Status: Within Functional Limits for tasks assessed                                          General Comments      Exercises     Assessment/Plan    PT Assessment Patient does not need any further PT services  PT Problem List         PT Treatment Interventions      PT Goals (Current goals can be found in the Care Plan section)  Acute Rehab PT Goals Patient Stated Goal: to go home PT Goal Formulation: With patient Time For Goal Achievement: 06/24/21 Potential to Achieve Goals: Good    Frequency       Co-evaluation               AM-PAC PT "6 Clicks" Mobility  Outcome Measure Help needed turning from your back to your side while in a flat bed without using bedrails?: None Help needed moving from lying on your back to sitting on the side of a flat bed without using bedrails?: None Help needed moving to and from a bed to a chair (including a wheelchair)?: None Help needed standing up from a chair using your arms (e.g., wheelchair or bedside chair)?: None Help needed to walk in hospital room?: None Help needed climbing 3-5 steps with a railing? : A Little 6 Click Score: 23    End of Session Equipment Utilized During Treatment: Gait belt Activity Tolerance: Patient tolerated treatment well Patient left: in chair;with call bell/phone within reach (in recliner in ED) Nurse Communication: Mobility status PT Visit Diagnosis: Other abnormalities of gait and mobility (R26.89);Other symptoms and signs involving the nervous system [id number])    Time: [id number] PT Time Calculation (min) (ACUTE ONLY): 21 min   Charges:   PT Evaluation $PT Eval Low Complexity: 1 Low          Reuel Derby, PT, DPT  Acute Rehabilitation Services  Office: [phone]   Rudean Hitt 06/24/2021, 3:31 PM

## 2021-06-24 NOTE — ED Notes (Signed)
Pt not in room at this time pt is in ct

## 2021-06-25 ENCOUNTER — Telehealth: Payer: Self-pay

## 2021-06-25 ENCOUNTER — Observation Stay (HOSPITAL_COMMUNITY): Payer: Medicare Other

## 2021-06-25 ENCOUNTER — Other Ambulatory Visit (HOSPITAL_COMMUNITY): Payer: Self-pay

## 2021-06-25 DIAGNOSIS — I25118 Atherosclerotic heart disease of native coronary artery with other forms of angina pectoris: Secondary | ICD-10-CM

## 2021-06-25 DIAGNOSIS — I5022 Chronic systolic (congestive) heart failure: Secondary | ICD-10-CM | POA: Diagnosis not present

## 2021-06-25 DIAGNOSIS — I472 Ventricular tachycardia, unspecified: Secondary | ICD-10-CM | POA: Diagnosis not present

## 2021-06-25 DIAGNOSIS — G459 Transient cerebral ischemic attack, unspecified: Secondary | ICD-10-CM | POA: Diagnosis not present

## 2021-06-25 LAB — BASIC METABOLIC PANEL
Anion gap: 8 (ref 5–15)
BUN: 10 mg/dL (ref 8–23)
CO2: 24 mmol/L (ref 22–32)
Calcium: 8.4 mg/dL — ABNORMAL LOW (ref 8.9–10.3)
Chloride: 109 mmol/L (ref 98–111)
Creatinine, Ser: 1.11 mg/dL (ref 0.61–1.24)
GFR, Estimated: >60 mL/min (ref >60)
Glucose, Bld: 95 mg/dL (ref 70–99)
Potassium: 3.6 mmol/L (ref 3.5–5.1)
Sodium: 141 mmol/L (ref 135–145)

## 2021-06-25 LAB — CBC WITH DIFFERENTIAL/PLATELET
Abs Immature Granulocytes: 0.03 10*3/uL (ref 0.00–0.07)
Basophils Absolute: 0 10*3/uL (ref 0.0–0.1)
Basophils Relative: 1 %
Eosinophils Absolute: 0.1 10*3/uL (ref 0.0–0.5)
Eosinophils Relative: 1 %
HCT: 38.1 % — ABNORMAL LOW (ref 39.0–52.0)
Hemoglobin: 12.5 g/dL — ABNORMAL LOW (ref 13.0–17.0)
Immature Granulocytes: 1 %
Lymphocytes Relative: 30 %
Lymphs Abs: 1.9 10*3/uL (ref 0.7–4.0)
MCH: 30.4 pg (ref 26.0–34.0)
MCHC: 32.8 g/dL (ref 30.0–36.0)
MCV: 92.7 fL (ref 80.0–100.0)
Monocytes Absolute: 0.7 10*3/uL (ref 0.1–1.0)
Monocytes Relative: 12 %
Neutro Abs: 3.5 10*3/uL (ref 1.7–7.7)
Neutrophils Relative %: 55 %
Platelets: 131 10*3/uL — ABNORMAL LOW (ref 150–400)
RBC: 4.11 MIL/uL — ABNORMAL LOW (ref 4.22–5.81)
RDW: 14.2 % (ref 11.5–15.5)
WBC: 6.3 10*3/uL (ref 4.0–10.5)
nRBC: 0 % (ref 0.0–0.2)

## 2021-06-25 LAB — LIPID PANEL
Cholesterol: 120 mg/dL (ref 0–200)
HDL: 43 mg/dL (ref >40)
LDL Cholesterol: 57 mg/dL (ref 0–99)
Total CHOL/HDL Ratio: 2.8 RATIO
Triglycerides: 101 mg/dL (ref <150)
VLDL: 20 mg/dL (ref 0–40)

## 2021-06-25 LAB — MAGNESIUM: Magnesium: 2.2 mg/dL (ref 1.7–2.4)

## 2021-06-25 MED ORDER — FUROSEMIDE 40 MG PO TABS: 40.0000 mg | ORAL_TABLET | Freq: Two times a day (BID) | ORAL | Status: DC

## 2021-06-25 MED ORDER — AMIODARONE HCL 200 MG PO TABS
ORAL_TABLET | ORAL | 0 refills | Status: DC
  Filled 2021-06-25: qty 59, 52d supply, fill #0

## 2021-06-25 NOTE — Plan of Care (Signed)

## 2021-06-25 NOTE — Telephone Encounter (Signed)
Dr Phillips Climes called from [facility] asking for this pt to be f/u with our providers. 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 The requested time frame for f/u is routine, clarified to 4-6 weeks. Dr advised once pt is d/c'd, we will get hospital records to hospital coordinator to be scheduled.

## 2021-06-25 NOTE — Progress Notes (Signed)
STROKE TEAM PROGRESS NOTE   SUBJECTIVE (INTERVAL HISTORY) No family is at the bedside.  Overall his condition is completely resolved.  He is eager to go home today.   OBJECTIVE Temp:  [97.3 F (36.3 C)-98.6 F (37 C)] 97.5 F (36.4 C) (06/14 1230) Pulse Rate:  [52-66] 58 (06/14 1230) Cardiac Rhythm: Sinus bradycardia;Normal sinus rhythm (06/14 0920) Resp:  [16-20] 20 (06/14 1230) BP: (102-110)/(51-59) 109/58 (06/14 1230) SpO2:  [95 %-97 %] 95 % (06/14 1230) FiO2 (%):  [32 %] 32 % (06/14 0744)  Recent Labs  Lab 06/24/21 0108  GLUCAP 105*   Recent Labs  Lab 06/24/21 0100 06/24/21 0730 06/25/21 0048  NA 141  --  141  K 3.1*  --  3.6  CL 102  --  109  CO2 27  --  24  GLUCOSE 107*  --  95  BUN 9  --  10  CREATININE 1.38*  --  1.11  CALCIUM 8.9  --  8.4*  MG  --  2.0 2.2   Recent Labs  Lab 06/24/21 0100  AST 22  ALT 19  ALKPHOS 54  BILITOT 0.8  PROT 6.3*  ALBUMIN 3.4*   Recent Labs  Lab 06/24/21 0100 06/25/21 0048  WBC 6.5 6.3  NEUTROABS 3.7 3.5  HGB 13.1 12.5*  HCT 39.7 38.1*  MCV 93.4 92.7  PLT 144* 131*   No results for input(s): "CKTOTAL", "CKMB", "CKMBINDEX", "TROPONINI" in the last 168 hours. Recent Labs    06/24/21 0100  LABPROT 16.8*  INR 1.4*   Recent Labs    06/24/21 0121  COLORURINE STRAW*  LABSPEC 1.002*  PHURINE 7.0  GLUCOSEU NEGATIVE  HGBUR NEGATIVE  BILIRUBINUR NEGATIVE  KETONESUR NEGATIVE  PROTEINUR NEGATIVE  NITRITE NEGATIVE  LEUKOCYTESUR NEGATIVE       Component Value Date/Time   CHOL 120 06/25/2021 0048   TRIG 101 06/25/2021 0048   HDL 43 06/25/2021 0048   CHOLHDL 2.8 06/25/2021 0048   VLDL 20 06/25/2021 0048   LDLCALC 57 06/25/2021 0048   Lab Results  Component Value Date   HGBA1C 5.6 06/24/2021      Component Value Date/Time   LABOPIA NONE DETECTED 06/24/2021 0121   COCAINSCRNUR NONE DETECTED 06/24/2021 0121   LABBENZ NONE DETECTED 06/24/2021 0121   AMPHETMU NONE DETECTED 06/24/2021 0121   THCU NONE  DETECTED 06/24/2021 0121   LABBARB NONE DETECTED 06/24/2021 0121    Recent Labs  Lab 06/24/21 0100  ETH <10    I have personally reviewed the radiological images below and agree with the radiology interpretations.  CT HEAD WO CONTRAST (5MM)  Result Date: 06/25/2021 CLINICAL DATA:  71 year old male TIA. EXAM: CT HEAD WITHOUT CONTRAST TECHNIQUE: Contiguous axial images were obtained from the base of the skull through the vertex without intravenous contrast. RADIATION DOSE REDUCTION: This exam was performed according to the departmental dose-optimization program which includes automated exposure control, adjustment of the mA and/or kV according to patient size and/or use of iterative reconstruction technique. COMPARISON:  CT head and CTA head and neck yesterday. FINDINGS: Brain: No midline shift, mass effect, or evidence of intracranial mass lesion. No ventriculomegaly. No acute intracranial hemorrhage identified. Bilateral deep gray matter nuclei, brainstem, and posterior fossa gray-white matter differentiation appears stable and within normal limits. Chronic right parietal lobe encephalomalacia (series 2, image 21) is stable from last year. Questionable anterior left middle frontal gyrus cortical hypodensity (series 2, image 19) appears to be artifact from a horizontal sulcus there on coronal  images (coronal image 23). No acute or evolving cortical infarct identified. Vascular: Calcified atherosclerosis at the skull base. Distal left vertebral artery appears dominant. No suspicious intracranial vascular hyperdensity. Skull: No acute osseous abnormality identified. Sinuses/Orbits: Paranasal sinuses are stable and well aerated. Opacified right middle ear and mastoids unchanged. Contralateral left tympanic cavity is clear. Mild left mastoid opacification is stable. Other: No acute orbit or scalp soft tissue finding. IMPRESSION: 1. No acute or evolving infarct identified. Stable non contrast CT appearance of  the brain with right parietal lobe encephalomalacia. 2. Continued right middle ear and mastoid opacification, progressed from last year. Consider otitis media. Electronically Signed   By: Genevie Ann M.D.   On: 06/25/2021 06:22   ECHOCARDIOGRAM COMPLETE  Result Date: 06/24/2021    ECHOCARDIOGRAM REPORT   Patient Name:   Max Rivas Date of Exam: 06/24/2021 Medical Rec #:  [id number]      Height:       71.0 in Accession #:    [id number]     Weight:       217.0 lb Date of Birth:  06/21/1950      BSA:          2.183 m Patient Age:    71 years       BP:           107/55 mmHg Patient Gender: M              HR:           58 bpm. Exam Location:  Inpatient Procedure: 2D Echo, Cardiac Doppler and Color Doppler Indications:    TIA  History:        Patient has prior history of Echocardiogram examinations, most                 recent 03/19/2020. CHF, CAD, COPD, Arrythmias:Atrial Fibrillation;                 Risk Factors:Dyslipidemia.  Sonographer:    Maudry Mayhew MHA, RDMS, RVT, RDCS Referring Phys: [id number] [facility]  Sonographer Comments: Technically difficult study due to poor echo windows. Image acquisition challenging due to COPD and Image acquisition challenging due to respiratory motion. IMPRESSIONS  1. Technically difficult study with limited views, even with contrast. Left ventricular ejection fraction, by estimation, is 40 to 45%. The left ventricle has mildly decreased function. The left ventricle demonstrates regional wall motion abnormalities.  Lateral akinesis. The left ventricular internal cavity size was mildly dilated. Left ventricular diastolic parameters are consistent with Grade III diastolic dysfunction (restrictive). Elevated left atrial pressure.  2. Right ventricular systolic function is mildly reduced. The right ventricular size is normal. There is normal pulmonary artery systolic pressure. The estimated right ventricular systolic pressure is [id number].  3. Left atrial size was moderately  dilated.  4. Right atrial size was moderately dilated.  5. The mitral valve is normal in structure. Trivial mitral valve regurgitation.  6. The aortic valve was not well visualized. Aortic valve regurgitation is not visualized. No aortic stenosis is present.  7. The inferior vena cava is normal in size with greater than 50% respiratory variability, suggesting right atrial pressure of 3 mmHg. FINDINGS  Left Ventricle: Left ventricular ejection fraction, by estimation, is 40 to 45%. The left ventricle has mildly decreased function. The left ventricle demonstrates regional wall motion abnormalities. Definity contrast agent was given IV to delineate the left ventricular endocardial borders. The left ventricular internal cavity size was mildly dilated. There  is no left ventricular hypertrophy. Left ventricular diastolic parameters are consistent with Grade III diastolic dysfunction (restrictive). Elevated left atrial pressure. Right Ventricle: The right ventricular size is normal. Right vetricular wall thickness was not well visualized. Right ventricular systolic function is mildly reduced. There is normal pulmonary artery systolic pressure. The tricuspid regurgitant velocity is 2.37 m/s, and with an assumed right atrial pressure of 3 mmHg, the estimated right ventricular systolic pressure is 25.5 mmHg. Left Atrium: Left atrial size was moderately dilated. Right Atrium: Right atrial size was moderately dilated. Pericardium: There is no evidence of pericardial effusion. Mitral Valve: The mitral valve is normal in structure. Trivial mitral valve regurgitation. Tricuspid Valve: The tricuspid valve is not well visualized. Tricuspid valve regurgitation is trivial. Aortic Valve: The aortic valve was not well visualized. Aortic valve regurgitation is not visualized. No aortic stenosis is present. Aortic valve mean gradient measures 2.5 mmHg. Aortic valve peak gradient measures 4.6 mmHg. Aortic valve area, by VTI measures 1.36  cm. Pulmonic Valve: The pulmonic valve was not well visualized. Pulmonic valve regurgitation is not visualized. Aorta: The aortic root is normal in size and structure. Venous: The inferior vena cava is normal in size with greater than 50% respiratory variability, suggesting right atrial pressure of 3 mmHg. IAS/Shunts: The interatrial septum was not well visualized.  LEFT VENTRICLE PLAX 2D LVIDd:         6.05 cm   Diastology LVIDs:         5.00 cm   LV e' medial:    5.92 cm/s LV PW:         0.80 cm   LV E/e' medial:  17.7 LV IVS:        0.55 cm   LV e' lateral:   5.59 cm/s LVOT diam:     1.60 cm   LV E/e' lateral: 18.8 LV SV:         34 LV SV Index:   16 LVOT Area:     2.01 cm  RIGHT VENTRICLE TAPSE (M-mode): 1.9 cm LEFT ATRIUM             Index        RIGHT ATRIUM           Index LA diam:        5.30 cm 2.43 cm/m   RA Area:     26.70 cm LA Vol (A2C):   85.7 ml 39.26 ml/m  RA Volume:   97.40 ml  44.62 ml/m LA Vol (A4C):   88.4 ml 40.50 ml/m LA Biplane Vol: 90.4 ml 41.41 ml/m  AORTIC VALVE AV Area (Vmax):    1.39 cm AV Area (Vmean):   1.39 cm AV Area (VTI):     1.36 cm AV Vmax:           107.00 cm/s AV Vmean:          73.250 cm/s AV VTI:            0.254 m AV Peak Grad:      4.6 mmHg AV Mean Grad:      2.5 mmHg LVOT Vmax:         74.20 cm/s LVOT Vmean:        50.800 cm/s LVOT VTI:          0.171 m LVOT/AV VTI ratio: 0.67  AORTA Ao Root diam: 2.70 cm MITRAL VALVE                TRICUSPID VALVE MV  Area (PHT): 4.26 cm     TR Peak grad:   22.5 mmHg MV Decel Time: 178 msec     TR Vmax:        237.00 cm/s MR Peak grad: 67.9 mmHg MR Mean grad: 44.0 mmHg     SHUNTS MR Vmax:      412.00 cm/s   Systemic VTI:  0.17 m MR Vmean:     318.0 cm/s    Systemic Diam: 1.60 cm MV E velocity: 105.00 cm/s MV A velocity: 52.00 cm/s MV E/A ratio:  2.02 Epifanio Lesches MD Electronically signed by Epifanio Lesches MD Signature Date/Time: 06/24/2021/2:16:13 PM    Final    CT ANGIO HEAD NECK W WO CM  Result Date:  06/24/2021 CLINICAL DATA:  Transient ischemic attack (TIA) TIA EXAM: CT ANGIOGRAPHY HEAD AND NECK TECHNIQUE: Multidetector CT imaging of the head and neck was performed using the standard protocol during bolus administration of intravenous contrast. Multiplanar CT image reconstructions and MIPs were obtained to evaluate the vascular anatomy. Carotid stenosis measurements (when applicable) are obtained utilizing NASCET criteria, using the distal internal carotid diameter as the denominator. RADIATION DOSE REDUCTION: This exam was performed according to the departmental dose-optimization program which includes automated exposure control, adjustment of the mA and/or kV according to patient size and/or use of iterative reconstruction technique. CONTRAST:  24mL OMNIPAQUE IOHEXOL 350 MG/ML SOLN COMPARISON:  CT head from the same day. FINDINGS: CTA NECK FINDINGS Aortic arch: Atherosclerosis of the aorta and great vessel origins. Great vessel origins are patent. Right carotid system: Atherosclerosis at the carotid bifurcation with approximately 50% stenosis of the ICA origin. Left carotid system: Atherosclerosis at the carotid bifurcation without greater than 50% stenosis. Vertebral arteries: Left dominant. Patent bilaterally without evidence of significant (greater than 50%) stenosis. Skeleton: Moderate multilevel degenerative change. Other neck: No acute findings. Upper chest: Emphysema in the lung apices. Biapical pleuroparenchymal scarring. Review of the MIP images confirms the above findings CTA HEAD FINDINGS Anterior circulation: Bilateral intracranial ICAs are patent with mild narrowing due to calcific atherosclerosis. Bilateral MCAs and ACAs are patent without proximal hemodynamically significant stenosis. Posterior circulation: Bilateral intradural vertebral arteries, basilar artery, and posterior cerebral arteries are patent without proximal hemodynamically significant stenosis. Venous sinuses: As permitted by  contrast timing, patent. Review of the MIP images confirms the above findings IMPRESSION: CTA head: No emergent large vessel occlusion or proximal hemodynamically significant stenosis. CTA neck: 1. Bilateral carotid bifurcation atherosclerosis with approximately 50% stenosis of the right ICA origin. 2. Otherwise, no significant (50% or greater) stenosis. 3. Aortic Atherosclerosis (ICD10-I70.0) and Emphysema (ICD10-J43.9). Electronically Signed   By: Feliberto Harts M.D.   On: 06/24/2021 09:34   CT HEAD WO CONTRAST  Result Date: 06/24/2021 CLINICAL DATA:  Transient ischemic attack (TIA) EXAM: CT HEAD WITHOUT CONTRAST TECHNIQUE: Contiguous axial images were obtained from the base of the skull through the vertex without intravenous contrast. RADIATION DOSE REDUCTION: This exam was performed according to the departmental dose-optimization program which includes automated exposure control, adjustment of the mA and/or kV according to patient size and/or use of iterative reconstruction technique. COMPARISON:  None Available. FINDINGS: Brain: No evidence of acute infarction, hemorrhage, hydrocephalus, extra-axial collection or mass lesion/mass effect. Remote right parietal cortical infarct again noted. Vascular: No hyperdense vessel or unexpected calcification. Skull: Normal. Negative for fracture or focal lesion. Sinuses/Orbits: No acute finding. Other: There is fluid opacification of the right mastoid air cells as well as a small amount of fluid within the right  middle ear cavity. Several inferior left mastoid air cells are also opacified. Left middle ear cavity is clear. IMPRESSION: No acute intracranial abnormality. 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 Fluid opacification the right mastoid air cells and middle ear cavity. Clinical correlation for signs and symptoms of otomastoiditis may be helpful. Electronically Signed   By: Fidela Salisbury M.D.   On: 06/24/2021 02:09     PHYSICAL EXAM  Temp:   [97.3 F (36.3 C)-98.6 F (37 C)] 97.5 F (36.4 C) (06/14 1230) Pulse Rate:  [52-66] 58 (06/14 1230) Resp:  [16-20] 20 (06/14 1230) BP: (102-110)/(51-59) 109/58 (06/14 1230) SpO2:  [95 %-97 %] 95 % (06/14 1230) FiO2 (%):  [32 %] 32 % (06/14 0744)  General - Well nourished, well developed, in no apparent distress.  Ophthalmologic - fundi not visualized due to noncooperation.  Cardiovascular - Regular rhythm and rate.  Mental Status -  Level of arousal and orientation to time, place, and person were intact. Language including expression, naming, repetition, comprehension was assessed and found intact. Fund of Knowledge was assessed and was intact.  Cranial Nerves II - XII - II - Visual field intact OU. III, IV, VI - Extraocular movements intact. V - Facial sensation intact bilaterally. VII - Facial movement intact bilaterally. VIII - Hearing & vestibular intact bilaterally. X - Palate elevates symmetrically. XI - Chin turning & shoulder shrug intact bilaterally. XII - Tongue protrusion intact.  Motor Strength - The patient's strength was normal in all extremities and pronator drift was absent.  Bulk was normal and fasciculations were absent.   Motor Tone - Muscle tone was assessed at the neck and appendages and was normal.  Reflexes - The patient's reflexes were symmetrical in all extremities and he had no pathological reflexes.  Sensory - Light touch, temperature/pinprick were assessed and were symmetrical.    Coordination - The patient had normal movements in the hands and feet with no ataxia or dysmetria.  Tremor was absent.  Gait and Station - deferred.   ASSESSMENT/PLAN [name] is a 71 y.o. male with history of PAF on Eliquis, CAD status post stenting, hyperlipidemia, OSA, PE, VT status post AICD, status post LAA clipping, CHF, carotid stenosis admitted for left facial numbness, left eye blurry vision, left upper extremity numbness and weakness.  Symptoms all  resolved in ER.  Not able to have MRI due to AICD.  CT no acute abnormality, but old right parietal infarct.  CT head and neck right ICA 50% stenosis.  Repeat CT no acute finding.  EF 40 to 45%, LDL 57, A1c 5.6, UDS negative.  Creatinine 1.38.  Etiology for patient stroke not quite clear, could be due to small vessel disease versus cardioembolic source.  However, he is ready on Eliquis, aspirin 81 and Crestor 20 at home, maximized therapy, will not make any changes at this time, continue home Eliquis, aspirin 81 and Crestor 20 on discharge.  Aggressive risk factor modification. Neurology will sign off. Please call with questions. Pt will follow up with stroke clinic NP at [facility] in about 4 weeks. Thanks for the consult.   Rosalin Hawking, MD PhD Stroke Neurology 06/25/2021 11:12 PM    To contact Stroke Continuity provider, please refer to [email or web address] After hours, contact General Neurology

## 2021-06-25 NOTE — Discharge Summary (Signed)
Physician Discharge Summary  Max Rivas [id number] DOB: 09-20-1950 DOA: 06/24/2021  PCP: [name], MD  Admit date: 06/24/2021 Discharge date: 06/25/2021  Admitted From: Home Disposition:  Home   Recommendations for Outpatient Follow-up:  Follow up with PCP in 1-2 weeks Please obtain BMP/CBC in one week Vascular surgery will arrange for outpatient follow-up regarding right ICA stenosis Neurology will follow as an outpatient Patient will need outpatient referral to ENT for finding on imaging concerning for otitis media  Home Health:No   Discharge Condition:Stable CODE STATUS:FULL Diet recommendation: Heart Healthy   Brief/Interim Summary:   [name] is a 71 y.o. 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  Symptoms improved on arrival to the ED.  CT head negative for acute stroke.  Labs showing WBC 6.5, hemoglobin 13.1, platelet count 144k.  Sodium 141, potassium 3.1, chloride 102, bicarb 27, BUN 9, creatinine 1.3 (stable), glucose 107.  Blood ethanol level undetectable.  UDS negative.  UA without signs of infection, MRI could not be obtained due to ICD, repeat CT head this morning with no acute finding.  TIA Patient presenting with complaints of acute onset left eye blurry vision and left lower face and left arm numbness, LKW around 2300 on 06/23/2021.  Symptoms has totally resolved, with no residual deficit, initial CT head negative for any acute findings, MRI could not be obtained due to AICD, so repeat CT head was obtained in 24 hours, where it remains with no  acute findings . -Patient already on Eliquis, aspirin, and statin which has been continued on discharge . -Work-up significant for 50% right ICA stenosis, referral has been made to vascular surgery .  CT head finding of right middle ear and mastoid opacification concerning for otitis media -Signs and symptoms of mastoiditis, no indication for antibiotics, recommend outpatient ENT follow-up    Mild hypokalemia -Repleted   CAD status post CABG -Continue aspirin and statin. -No CVA has been ruled out, continue with home medications    Chronic HFrEF Echo showing EF 30 to [id number], grade 2 diastolic dysfunction.  Has mild peripheral edema but lungs clear on exam. -Continue with home regimen   Paroxysmal A-fib -Continue Eliquis and beta-blockers -Continue with amiodarone   History of VT -Has an ICD -Continue antiarrhythmics   QT prolongation QTc 514. Electrolytes repleted   COPD Chronic hypoxic respiratory failure on 2-4 L O2 Stable. -Continue home inhalers -Continue supplemental oxygen   Hyperlipidemia -Continue Crestor   CKD stage IIIa Renal function stable.   Hypothyroidism -Continue Synthroid  Discharge Diagnoses:  Principal Problem:   TIA (transient ischemic attack) Active Problems:   Coronary artery disease   Hypothyroidism   Chronic HFrEF (heart failure with reduced ejection fraction) (HCC)   Ventricular tachycardia ([place])   Hypokalemia    Discharge Instructions  Discharge Instructions     Ambulatory referral to Neurology   Complete by: As directed    Follow up with stroke clinic NP (Jessica Vanschaick or Cecille Rubin, if both not available, consider Zachery Dauer, or A[facility]) at [facility] in about 4 weeks. Thanks.   Diet - low sodium heart healthy   Complete  by: As directed    Discharge instructions   Complete by: As directed    Follow with Primary MD [name], MD in 7 days   Get CBC, CMP, checked  by Primary MD next visit.    Activity: As  tolerated with Full fall precautions use walker/cane & assistance as needed   Disposition Home    Diet: Heart Healthy    On your next visit with your primary care physician please Get Medicines reviewed and adjusted.   Please request your Prim.MD to go over all Hospital Tests and Procedure/Radiological results at the follow up, please get all Hospital records sent to your Prim MD by signing hospital release before you go home.   If you experience worsening of your admission symptoms, develop shortness of breath, life threatening emergency, suicidal or homicidal thoughts you must seek medical attention immediately by calling 911 or calling your MD immediately  if symptoms less severe.  You Must read complete instructions/literature along with all the possible adverse reactions/side effects for all the Medicines you take and that have been prescribed to you. Take any new Medicines after you have completely understood and accpet all the possible adverse reactions/side effects.   Do not drive, operating heavy machinery, perform activities at heights, swimming or participation in water activities or provide baby sitting services if your were admitted for syncope or siezures until you have seen by Primary MD or a Neurologist and advised to do so again.  Do not drive when taking Pain medications.    Do not take more than prescribed Pain, Sleep and Anxiety Medications  Special Instructions: If you have smoked or chewed Tobacco  in the last 2 yrs please stop smoking, stop any regular Alcohol  and or any Recreational drug use.  Wear Seat belts while driving.   Please note  You were cared for by a hospitalist during your hospital stay. If you have any questions about your discharge medications or the care you received while you were in the hospital after you are discharged, you can call the unit and asked to speak with the hospitalist on call if the hospitalist that took care of you is not  available. Once you are discharged, your primary care physician will handle any further medical issues. Please note that NO REFILLS for any discharge medications will be authorized once you are discharged, as it is imperative that you return to your primary care physician (or establish a relationship with a primary care physician if you do not have one) for your aftercare needs so that they can reassess your need for medications and monitor your lab values.   Increase activity slowly   Complete by: As directed       Allergies as of 06/25/2021       Reactions   Crestor [rosuvastatin Calcium] Other (See Comments)   Back spasms, initially, but can tolerate it at a low dose now, in 2022   Rosuvastatin Other (See Comments)   Back spasms, initially, but can tolerate it at a low dose now, in 2022   Albuterol Other (See Comments)   IRREGULAR HEART RATE   Atorvastatin Other (See Comments)   Spasticity        Medication List     STOP taking these medications    levofloxacin 500 MG tablet Commonly known as: LEVAQUIN   methylPREDNISolone 4 MG Tbpk tablet Commonly known as: MEDROL DOSEPAK       TAKE these medications    acetaminophen 500 MG  tablet Commonly known as: TYLENOL Take 500-1,000 mg by mouth every 6 (six) hours as needed for moderate pain, headache or mild pain.   amiodarone 200 MG tablet Commonly known as: PACERONE Take 1 tablet (200 mg total) by mouth 2 (two) times daily for 7 days, THEN 1 tablet (200 mg total) daily. Start taking on: June 25, 2021 What changed:  medication strength See the new instructions.   apixaban 5 MG Tabs tablet Commonly known as: ELIQUIS Take 5 mg by mouth 2 (two) times daily.   aspirin 81 MG chewable tablet Chew 1 tablet (81 mg total) by mouth daily.   Fish Oil 1000 MG Caps Take 1,000 mg by mouth in the morning and at bedtime.   furosemide 40 MG tablet Commonly known as: LASIX Take 1 tablet (40 mg total) by mouth 2 (two) times  daily. Start taking on: June 28, 2021 What changed: These instructions start on June 28, 2021. If you are unsure what to do until then, ask your doctor or other care provider.   gabapentin 100 MG capsule Commonly known as: NEURONTIN Take 2 capsules (200 mg total) by mouth 2 (two) times daily.   isosorbide mononitrate 30 MG 24 hr tablet Commonly known as: IMDUR Take 1 tablet (30 mg total) by mouth daily.   levalbuterol 45 MCG/ACT inhaler Commonly known as: XOPENEX HFA Inhale 2 puffs into the lungs every 6 (six) hours as needed for wheezing or shortness of breath.   levothyroxine 75 MCG tablet Commonly known as: SYNTHROID Take 75 mcg by mouth daily before breakfast.   metoprolol succinate 25 MG 24 hr tablet Commonly known as: TOPROL-XL Take 12.5 mg by mouth in the morning.   mexiletine 150 MG capsule Commonly known as: MEXITIL Take 2 capsules (300 mg total) by mouth 2 (two) times daily. What changed: how much to take   mometasone 220 MCG/INH inhaler Commonly known as: ASMANEX Inhale 2 puffs into the lungs 2 (two) times daily.   montelukast 10 MG tablet Commonly known as: SINGULAIR Take 10 mg by mouth at bedtime.   multivitamin with minerals Tabs tablet Take 1 tablet by mouth daily with breakfast.   nitroGLYCERIN 0.4 MG SL tablet Commonly known as: NITROSTAT Place 0.4 mg under the tongue every 5 (five) minutes as needed for chest pain.   omeprazole 20 MG capsule Commonly known as: PRILOSEC Take 20 mg by mouth in the morning and at bedtime.   OXYGEN Inhale 4 L into the lungs continuous.   potassium chloride SA 20 MEQ tablet Commonly known as: KLOR-CON M Take 1 tablet (20 mEq total) by mouth 2 (two) times daily.   rosuvastatin 40 MG tablet Commonly known as: CRESTOR Take 20 mg by mouth at bedtime.   Systane Balance 0.6 % Soln Generic drug: Propylene Glycol Place 1 drop into both eyes 4 (four) times daily.   Tiotropium Bromide-Olodaterol 2.5-2.5 MCG/ACT  Aers Inhale 1 puff into the lungs in the morning and at bedtime.   Vitamin D3 50 MCG (2000 UT) Tabs Take 2,000 Units by mouth daily.        Follow-up Information     [facility]. Schedule an appointment as soon as possible for a visit in 1 month(s).   Specialty: Neurology Why: stroke clinic Contact information: [place] [phone]               Allergies  Allergen Reactions   Crestor [Rosuvastatin Calcium] Other (See Comments)  Back spasms, initially, but can tolerate it at a low dose now, in 2022   Rosuvastatin Other (See Comments)    Back spasms, initially, but can tolerate it at a low dose now, in 2022   Albuterol Other (See Comments)    IRREGULAR HEART RATE   Atorvastatin Other (See Comments)    Spasticity    Consultations: Neurology   Procedures/Studies: CT HEAD WO CONTRAST (5MM)  Result Date: 06/25/2021 CLINICAL DATA:  71 year old male TIA. EXAM: CT HEAD WITHOUT CONTRAST TECHNIQUE: Contiguous axial images were obtained from the base of the skull through the vertex without intravenous contrast. RADIATION DOSE REDUCTION: This exam was performed according to the departmental dose-optimization program which includes automated exposure control, adjustment of the mA and/or kV according to patient size and/or use of iterative reconstruction technique. COMPARISON:  CT head and CTA head and neck yesterday. FINDINGS: Brain: No midline shift, mass effect, or evidence of intracranial mass lesion. No ventriculomegaly. No acute intracranial hemorrhage identified. Bilateral deep gray matter nuclei, brainstem, and posterior fossa gray-white matter differentiation appears stable and within normal limits. Chronic right parietal lobe encephalomalacia (series 2, image 21) is stable from last year. Questionable anterior left middle frontal gyrus cortical hypodensity (series 2, image 19) appears to be artifact from  a horizontal sulcus there on coronal images (coronal image 23). No acute or evolving cortical infarct identified. Vascular: Calcified atherosclerosis at the skull base. Distal left vertebral artery appears dominant. No suspicious intracranial vascular hyperdensity. Skull: No acute osseous abnormality identified. Sinuses/Orbits: Paranasal sinuses are stable and well aerated. Opacified right middle ear and mastoids unchanged. Contralateral left tympanic cavity is clear. Mild left mastoid opacification is stable. Other: No acute orbit or scalp soft tissue finding. IMPRESSION: 1. No acute or evolving infarct identified. Stable non contrast CT appearance of the brain with right parietal lobe encephalomalacia. 2. Continued right middle ear and mastoid opacification, progressed from last year. Consider otitis media. Electronically Signed   By: Genevie Ann M.D.   On: 06/25/2021 06:22   ECHOCARDIOGRAM COMPLETE  Result Date: 06/24/2021    ECHOCARDIOGRAM REPORT   Patient Name:   ERMIL SAXMAN Date of Exam: 06/24/2021 Medical Rec #:  [id number]      Height:       71.0 in Accession #:    [id number]     Weight:       217.0 lb Date of Birth:  Sep 08, 1950      BSA:          2.183 m Patient Age:    71 years       BP:           107/55 mmHg Patient Gender: M              HR:           58 bpm. Exam Location:  Inpatient Procedure: 2D Echo, Cardiac Doppler and Color Doppler Indications:    TIA  History:        Patient has prior history of Echocardiogram examinations, most                 recent 03/19/2020. CHF, CAD, COPD, Arrythmias:Atrial Fibrillation;                 Risk Factors:Dyslipidemia.  Sonographer:    Maudry Mayhew MHA, RDMS, RVT, RDCS Referring Phys: [id number] [facility]  Sonographer Comments: Technically difficult study due to poor echo windows. Image acquisition challenging due to COPD and Image acquisition challenging due  to respiratory motion. IMPRESSIONS  1. Technically difficult study with limited views, even with  contrast. Left ventricular ejection fraction, by estimation, is 40 to 45%. The left ventricle has mildly decreased function. The left ventricle demonstrates regional wall motion abnormalities.  Lateral akinesis. The left ventricular internal cavity size was mildly dilated. Left ventricular diastolic parameters are consistent with Grade III diastolic dysfunction (restrictive). Elevated left atrial pressure.  2. Right ventricular systolic function is mildly reduced. The right ventricular size is normal. There is normal pulmonary artery systolic pressure. The estimated right ventricular systolic pressure is [id number] mmHg.  3. Left atrial size was moderately dilated.  4. Right atrial size was moderately dilated.  5. The mitral valve is normal in structure. Trivial mitral valve regurgitation.  6. The aortic valve was not well visualized. Aortic valve regurgitation is not visualized. No aortic stenosis is present.  7. The inferior vena cava is normal in size with greater than 50% respiratory variability, suggesting right atrial pressure of 3 mmHg. FINDINGS  Left Ventricle: Left ventricular ejection fraction, by estimation, is 40 to 45%. The left ventricle has mildly decreased function. The left ventricle demonstrates regional wall motion abnormalities. Definity contrast agent was given IV to delineate the left ventricular endocardial borders. The left ventricular internal cavity size was mildly dilated. There is no left ventricular hypertrophy. Left ventricular diastolic parameters are consistent with Grade III diastolic dysfunction (restrictive). Elevated left atrial pressure. Right Ventricle: The right ventricular size is normal. Right vetricular wall thickness was not well visualized. Right ventricular systolic function is mildly reduced. There is normal pulmonary artery systolic pressure. The tricuspid regurgitant velocity is 2.37 m/s, and with an assumed right atrial pressure of 3 mmHg, the estimated right ventricular  systolic pressure is [id number] mmHg. Left Atrium: Left atrial size was moderately dilated. Right Atrium: Right atrial size was moderately dilated. Pericardium: There is no evidence of pericardial effusion. Mitral Valve: The mitral valve is normal in structure. Trivial mitral valve regurgitation. Tricuspid Valve: The tricuspid valve is not well visualized. Tricuspid valve regurgitation is trivial. Aortic Valve: The aortic valve was not well visualized. Aortic valve regurgitation is not visualized. No aortic stenosis is present. Aortic valve mean gradient measures 2.5 mmHg. Aortic valve peak gradient measures 4.6 mmHg. Aortic valve area, by VTI measures 1.36 cm. Pulmonic Valve: The pulmonic valve was not well visualized. Pulmonic valve regurgitation is not visualized. Aorta: The aortic root is normal in size and structure. Venous: The inferior vena cava is normal in size with greater than 50% respiratory variability, suggesting right atrial pressure of 3 mmHg. IAS/Shunts: The interatrial septum was not well visualized.  LEFT VENTRICLE PLAX 2D LVIDd:         6.05 cm   Diastology LVIDs:         5.00 cm   LV e' medial:    5.92 cm/s LV PW:         0.80 cm   LV E/e' medial:  17.7 LV IVS:        0.55 cm   LV e' lateral:   5.59 cm/s LVOT diam:     1.60 cm   LV E/e' lateral: 18.8 LV SV:         34 LV SV Index:   16 LVOT Area:     2.01 cm  RIGHT VENTRICLE TAPSE (M-mode): 1.9 cm LEFT ATRIUM             Index        RIGHT ATRIUM  Index LA diam:        5.30 cm 2.43 cm/m   RA Area:     26.70 cm LA Vol (A2C):   85.7 ml 39.26 ml/m  RA Volume:   97.40 ml  44.62 ml/m LA Vol (A4C):   88.4 ml 40.50 ml/m LA Biplane Vol: 90.4 ml 41.41 ml/m  AORTIC VALVE AV Area (Vmax):    1.39 cm AV Area (Vmean):   1.39 cm AV Area (VTI):     1.36 cm AV Vmax:           107.00 cm/s AV Vmean:          73.250 cm/s AV VTI:            0.254 m AV Peak Grad:      4.6 mmHg AV Mean Grad:      2.5 mmHg LVOT Vmax:         74.20 cm/s LVOT Vmean:         50.800 cm/s LVOT VTI:          0.171 m LVOT/AV VTI ratio: 0.67  AORTA Ao Root diam: 2.70 cm MITRAL VALVE                TRICUSPID VALVE MV Area (PHT): 4.26 cm     TR Peak grad:   22.5 mmHg MV Decel Time: 178 msec     TR Vmax:        237.00 cm/s MR Peak grad: 67.9 mmHg MR Mean grad: 44.0 mmHg     SHUNTS MR Vmax:      412.00 cm/s   Systemic VTI:  0.17 m MR Vmean:     318.0 cm/s    Systemic Diam: 1.60 cm MV E velocity: 105.00 cm/s MV A velocity: 52.00 cm/s MV E/A ratio:  2.02 Oswaldo Milian MD Electronically signed by Oswaldo Milian MD Signature Date/Time: 06/24/2021/2:16:13 PM    Final    CT ANGIO HEAD NECK W WO CM  Result Date: 06/24/2021 CLINICAL DATA:  Transient ischemic attack (TIA) TIA EXAM: CT ANGIOGRAPHY HEAD AND NECK TECHNIQUE: Multidetector CT imaging of the head and neck was performed using the standard protocol during bolus administration of intravenous contrast. Multiplanar CT image reconstructions and MIPs were obtained to evaluate the vascular anatomy. Carotid stenosis measurements (when applicable) are obtained utilizing NASCET criteria, using the distal internal carotid diameter as the denominator. RADIATION DOSE REDUCTION: This exam was performed according to the departmental dose-optimization program which includes automated exposure control, adjustment of the mA and/or kV according to patient size and/or use of iterative reconstruction technique. CONTRAST:  70mL OMNIPAQUE IOHEXOL 350 MG/ML SOLN COMPARISON:  CT head from the same day. FINDINGS: CTA NECK FINDINGS Aortic arch: Atherosclerosis of the aorta and great vessel origins. Great vessel origins are patent. Right carotid system: Atherosclerosis at the carotid bifurcation with approximately 50% stenosis of the ICA origin. Left carotid system: Atherosclerosis at the carotid bifurcation without greater than 50% stenosis. Vertebral arteries: Left dominant. Patent bilaterally without evidence of significant (greater than 50%) stenosis.  Skeleton: Moderate multilevel degenerative change. Other neck: No acute findings. Upper chest: Emphysema in the lung apices. Biapical pleuroparenchymal scarring. Review of the MIP images confirms the above findings CTA HEAD FINDINGS Anterior circulation: Bilateral intracranial ICAs are patent with mild narrowing due to calcific atherosclerosis. Bilateral MCAs and ACAs are patent without proximal hemodynamically significant stenosis. Posterior circulation: Bilateral intradural vertebral arteries, basilar artery, and posterior cerebral arteries are patent without proximal hemodynamically significant stenosis. Venous  sinuses: As permitted by contrast timing, patent. Review of the MIP images confirms the above findings IMPRESSION: CTA head: No emergent large vessel occlusion or proximal hemodynamically significant stenosis. CTA neck: 1. Bilateral carotid bifurcation atherosclerosis with approximately 50% stenosis of the right ICA origin. 2. Otherwise, no significant (50% or greater) stenosis. 3. Aortic Atherosclerosis (ICD10-I70.0) and Emphysema (ICD10-J43.9). Electronically Signed   By: Margaretha Sheffield M.D.   On: 06/24/2021 09:34   CT HEAD WO CONTRAST  Result Date: 06/24/2021 CLINICAL DATA:  Transient ischemic attack (TIA) EXAM: CT HEAD WITHOUT CONTRAST TECHNIQUE: Contiguous axial images were obtained from the base of the skull through the vertex without intravenous contrast. RADIATION DOSE REDUCTION: This exam was performed according to the departmental dose-optimization program which includes automated exposure control, adjustment of the mA and/or kV according to patient size and/or use of iterative reconstruction technique. COMPARISON:  None Available. FINDINGS: Brain: No evidence of acute infarction, hemorrhage, hydrocephalus, extra-axial collection or mass lesion/mass effect. Remote right parietal cortical infarct again noted. Vascular: No hyperdense vessel or unexpected calcification. Skull: Normal.  Negative for fracture or focal lesion. Sinuses/Orbits: No acute finding. Other: There is fluid opacification of the right mastoid air cells as well as a small amount of fluid within the right middle ear cavity. Several inferior left mastoid air cells are also opacified. Left middle ear cavity is clear. IMPRESSION: No acute intracranial abnormality. 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 Fluid opacification the right mastoid air cells and middle ear cavity. Clinical correlation for signs and symptoms of otomastoiditis may be helpful. Electronically Signed   By: Fidela Salisbury M.D.   On: 06/24/2021 02:09      Subjective:  Patient denies any complaints, no residual deficits. Discharge Exam: Vitals:   06/25/21 0920 06/25/21 1230  BP:  (!) 109/58  Pulse: (!) 52 (!) 58  Resp: 20 20  Temp:  (!) 97.5 F (36.4 C)  SpO2: 95% 95%   Vitals:   06/25/21 0744 06/25/21 0858 06/25/21 0920 06/25/21 1230  BP:  (!) 110/59  (!) 109/58  Pulse: 66 61 (!) 52 (!) 58  Resp: 16 17 20 20   Temp:  98 F (36.7 C)  (!) 97.5 F (36.4 C)  TempSrc:  Oral  Oral  SpO2: 96% 95% 95% 95%  Weight:      Height:        General: Pt is alert, awake, not in acute distress Cardiovascular: RRR, S1/S2 +, no rubs, no gallops Respiratory: CTA bilaterally, no wheezing, no rhonchi Abdominal: Soft, NT, ND, bowel sounds + Extremities: no edema, no cyanosis    The results of significant diagnostics from this hospitalization (including imaging, microbiology, ancillary and laboratory) are listed below for reference.     Microbiology: No results found for this or any previous visit (from the past 240 hour(s)).   Labs: BNP (last 3 results) Recent Labs    04/27/21 0543 04/28/21 0607 06/24/21 0729  BNP 665.0* 744.6* [id number]*   Basic Metabolic Panel: Recent Labs  Lab 06/24/21 0100 06/24/21 0730 06/25/21 0048  NA 141  --  141  K 3.1*  --  3.6  CL 102  --  109  CO2 27  --  24  GLUCOSE 107*  --  95  BUN 9   --  10  CREATININE 1.38*  --  1.11  CALCIUM 8.9  --  8.4*  MG  --  2.0 2.2   Liver Function Tests: Recent Labs  Lab 06/24/21 0100  AST 22  ALT 19  ALKPHOS 54  BILITOT 0.8  PROT 6.3*  ALBUMIN 3.4*   No results for input(s): "LIPASE", "AMYLASE" in the last 168 hours. No results for input(s): "AMMONIA" in the last 168 hours. CBC: Recent Labs  Lab 06/24/21 0100 06/25/21 0048  WBC 6.5 6.3  NEUTROABS 3.7 3.5  HGB 13.1 12.5*  HCT 39.7 38.1*  MCV 93.4 92.7  PLT 144* 131*   Cardiac Enzymes: No results for input(s): "CKTOTAL", "CKMB", "CKMBINDEX", "TROPONINI" in the last 168 hours. BNP: Invalid input(s): "POCBNP" CBG: Recent Labs  Lab 06/24/21 0108  GLUCAP 105*   D-Dimer No results for input(s): "DDIMER" in the last 72 hours. Hgb A1c Recent Labs    06/24/21 0729  HGBA1C 5.6   Lipid Profile Recent Labs    06/25/21 0048  CHOL 120  HDL 43  LDLCALC 57  TRIG 101  CHOLHDL 2.8   Thyroid function studies Recent Labs    06/24/21 0100  TSH 2.139   Anemia work up No results for input(s): "VITAMINB12", "FOLATE", "FERRITIN", "TIBC", "IRON", "RETICCTPCT" in the last 72 hours. Urinalysis    Component Value Date/Time   COLORURINE STRAW (A) 06/24/2021 0121   APPEARANCEUR CLEAR 06/24/2021 0121   LABSPEC 1.002 (L) 06/24/2021 0121   PHURINE 7.0 06/24/2021 0121   GLUCOSEU NEGATIVE 06/24/2021 0121   HGBUR NEGATIVE 06/24/2021 0121   BILIRUBINUR NEGATIVE 06/24/2021 0121   KETONESUR NEGATIVE 06/24/2021 0121   PROTEINUR NEGATIVE 06/24/2021 0121   NITRITE NEGATIVE 06/24/2021 0121   LEUKOCYTESUR NEGATIVE 06/24/2021 0121   Sepsis Labs Recent Labs  Lab 06/24/21 0100 06/25/21 0048  WBC 6.5 6.3   Microbiology No results found for this or any previous visit (from the past 240 hour(s)).   Time coordinating discharge: Over 30 minutes  SIGNED:   Phillips Climes, MD  [facility] 06/25/2021, 4:01 PM Pager   If 7PM-7AM, please contact  night-coverage [email or web address] Password [email or web address]

## 2021-06-25 NOTE — Discharge Instructions (Signed)
Follow with Primary MD Anson Fret, MD in 7 days   Get CBC, CMP, checked  by Primary MD next visit.    Activity: As tolerated with Full fall precautions use walker/cane & assistance as needed   Disposition Home    Diet: Heart Healthy    On your next visit with your primary care physician please Get Medicines reviewed and adjusted.   Please request your Prim.MD to go over all Hospital Tests and Procedure/Radiological results at the follow up, please get all Hospital records sent to your Prim MD by signing hospital release before you go home.   If you experience worsening of your admission symptoms, develop shortness of breath, life threatening emergency, suicidal or homicidal thoughts you must seek medical attention immediately by calling 911 or calling your MD immediately  if symptoms less severe.  You Must read complete instructions/literature along with all the possible adverse reactions/side effects for all the Medicines you take and that have been prescribed to you. Take any new Medicines after you have completely understood and accpet all the possible adverse reactions/side effects.   Do not drive, operating heavy machinery, perform activities at heights, swimming or participation in water activities or provide baby sitting services if your were admitted for syncope or siezures until you have seen by Primary MD or a Neurologist and advised to do so again.  Do not drive when taking Pain medications.    Do not take more than prescribed Pain, Sleep and Anxiety Medications  Special Instructions: If you have smoked or chewed Tobacco  in the last 2 yrs please stop smoking, stop any regular Alcohol  and or any Recreational drug use.  Wear Seat belts while driving.   Please note  You were cared for by a hospitalist during your hospital stay. If you have any questions about your discharge medications or the care you received while you were in the hospital after you are  discharged, you can call the unit and asked to speak with the hospitalist on call if the hospitalist that took care of you is not available. Once you are discharged, your primary care physician will handle any further medical issues. Please note that NO REFILLS for any discharge medications will be authorized once you are discharged, as it is imperative that you return to your primary care physician (or establish a relationship with a primary care physician if you do not have one) for your aftercare needs so that they can reassess your need for medications and monitor your lab values.

## 2021-06-25 NOTE — TOC Initial Note (Signed)
Transition of Care [facility]) - Initial/Assessment Note    Patient Details  Name: Max Rivas MRN: [id number] Date of Birth: May 12, 1950  Transition of Care [facility] CM/SW Contact:    Carles Collet, RN Phone Number: 06/25/2021, 9:40 AM  Clinical Narrative:                 Damaris Schooner w patient at bedside. He states that he is from home and lives with his sister.  He denies barriers to obtaining meds, get them mailed to him through the [place]. 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  He has home oxygen with [employer] for past 2 years.  His sister will provide transportation home and will bring his innogen POC. No HH or DME needs   Expected Discharge Plan: Home/Self Care Barriers to Discharge: Continued Medical Work up   Patient Goals and CMS Choice Patient states their goals for this hospitalization and ongoing recovery are:: to go home      Expected Discharge Plan and Services Expected Discharge Plan: Home/Self Care                                              Prior Living Arrangements/Services                       Activities of Daily Living Home Assistive Devices/Equipment: None ADL Screening (condition at time of admission) Patient's cognitive ability adequate to safely complete daily activities?: Yes Is the patient deaf or have difficulty hearing?: No Does the patient have difficulty seeing, even when wearing glasses/contacts?: No Does the patient have difficulty concentrating, remembering, or making decisions?: No Patient able to express need for assistance with ADLs?: No Does the patient have difficulty dressing or bathing?: No Independently performs ADLs?: Yes (appropriate for developmental age) Does the patient have difficulty walking or climbing stairs?: No Weakness of Legs: None Weakness of Arms/Hands: None  Permission Sought/Granted                  Emotional Assessment              Admission diagnosis:  TIA (transient  ischemic attack) [G45.9] Patient Active Problem List   Diagnosis Date Noted   TIA (transient ischemic attack) 06/24/2021   Hypokalemia 06/24/2021   Sepsis ([place]) 04/25/2021   Ventricular tachyarrhythmia ([place]) 12/07/2019   Cardiomyopathy, ischemic    Vomiting    Acute respiratory failure with hypoxia ([place]) 09/18/2019   Acute on chronic heart failure ([place]) 09/08/2019   Unstable angina ([place]) 05/21/2019   Ventricular tachycardia ([place]) 05/12/2019   Chest pain    VT (ventricular tachycardia) ([place]) 05/11/2019   Arrhythmia 03/12/2019   On home O2    Chronic HFrEF (heart failure with reduced ejection fraction) ([place]) 06/22/2018   Chronic respiratory failure with hypoxia ([place]) 06/22/2018   Congestive heart failure (CHF) ([place]) 06/22/2018   DCM (dilated cardiomyopathy) ([place]) 05/25/2017   Coronary artery disease 05/25/2017   Chronic atrial fibrillation 05/25/2017   COPD (chronic obstructive pulmonary disease) ([place]) 05/25/2017   CKD (chronic kidney disease) 05/25/2017   Hypothyroidism 05/25/2017   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/2017   OSA (obstructive sleep apnea) 05/25/2017   Pulmonary HTN ([place]) 05/25/2017   Mediastinal adenopathy    Cervical adenopathy    Acute respiratory failure with hypoxia and hypercarbia ([place]) 02/24/2016   Pulmonary  embolus ([place]) 02/24/2016   Pulmonary artery thrombosis (HCC)    Pleural effusion    Lung mass    Pleural plaque    Flutter-fibrillation [id number]   Acute systolic congestive heart failure (HCC)    Atrial fibrillation with RVR ([place])    Hypercholesterolemia 03/11/2006   Tobacco abuse 03/11/2006   Acute on chronic systolic congestive heart failure ([place]) 03/11/2006   GASTROESOPHAGEAL REFLUX, NO ESOPHAGITIS 03/11/2006   OSTEOARTHRITIS, MULTI SITES 03/11/2006   HIGH RISK PATIENT 03/11/2006   Tobacco dependence 03/11/2006   PCP:  [name], MD Pharmacy:   [id number] [address] [place] [address] Phone: [phone] Fax: [place], [place] - [address] [place] [address] Phone: [phone] Fax: [phone]  [facility] [address] [address] Phone: [phone] Fax: [phone]     Social Determinants of Health (SDOH) Interventions    Readmission Risk Interventions    06/25/2021    9:40 AM 04/28/2021   10:57 AM  Readmission Risk Prevention Plan  Transportation Screening Complete Complete  PCP or Specialist Appt within 3-5 Days Complete Complete  HRI or Home Care Consult Complete Complete  Social Work Consult for [place] Planning/Counseling Complete Complete  Palliative Care Screening Not Applicable Not Applicable  Medication Review Press photographer) Complete Complete

## 2021-06-26 ENCOUNTER — Other Ambulatory Visit (HOSPITAL_COMMUNITY): Payer: Self-pay

## 2021-07-30 NOTE — Progress Notes (Signed)
PATIENT: Max Rivas DOB: 1950/10/12  REASON FOR VISIT: follow up HISTORY FROM: patient PRIMARY NEUROLOGIST: Dr. Pearlean Brownie   Chief Complaint  Patient presents with   Transient Ischemic Attack    Rm 19, hospital FU     HISTORY OF PRESENT ILLNESS: Today 07/30/21:  Max Rivas is a 71 year old male with a history of TIA event.  He returns today for follow-up.  He reports that he has not had any additional strokelike symptoms since his hospitalization.  Remains on Eliquis and aspirin.  Primary care is managing risk factors. Wears oxygen. Overall he feels that he is doing well. Returns today for follow-up.  HISTORY Max Rivas is a 71 y.o. male with history of PAF on Eliquis, CAD status post stenting, hyperlipidemia, OSA, PE, VT status post AICD, status post LAA clipping, CHF, carotid stenosis admitted for left facial numbness, left eye blurry vision, left upper extremity numbness and weakness.  Symptoms all resolved in ER.  Not able to have MRI due to AICD.  CT no acute abnormality, but old right parietal infarct.  CT head and neck right ICA 50% stenosis.  Repeat CT no acute finding.  EF 40 to 45%, LDL 57, A1c 5.6, UDS negative.  Creatinine 1.38.   Etiology for patient stroke not quite clear, could be due to small vessel disease versus cardioembolic source.  However, he is ready on Eliquis, aspirin 81 and Crestor 20 at home, maximized therapy, will not make any changes at this time, continue home Eliquis, aspirin 81 and Crestor 20 on discharge.  Aggressive risk factor modification  REVIEW OF SYSTEMS: Out of a complete 14 system review of symptoms, the patient complains only of the following symptoms, and all other reviewed systems are negative.  See Hpi  ALLERGIES: Allergies  Allergen Reactions   Crestor [Rosuvastatin Calcium] Other (See Comments)    Back spasms, initially, but can tolerate it at a low dose now, in 2022   Rosuvastatin Other (See Comments)    Back spasms,  initially, but can tolerate it at a low dose now, in 2022   Albuterol Other (See Comments)    IRREGULAR HEART RATE   Atorvastatin Other (See Comments)    Spasticity    HOME MEDICATIONS: Outpatient Medications Prior to Visit  Medication Sig Dispense Refill   acetaminophen (TYLENOL) 500 MG tablet Take 500-1,000 mg by mouth every 6 (six) hours as needed for moderate pain, headache or mild pain.     amiodarone (PACERONE) 200 MG tablet Take 1 tablet (200 mg total) by mouth 2 (two) times daily for 7 days, THEN 1 tablet (200 mg total) daily. 59 tablet 0   apixaban (ELIQUIS) 5 MG TABS tablet Take 5 mg by mouth 2 (two) times daily.     aspirin 81 MG chewable tablet Chew 1 tablet (81 mg total) by mouth daily. 90 tablet 3   Cholecalciferol (VITAMIN D3) 50 MCG (2000 UT) TABS Take 2,000 Units by mouth daily.     furosemide (LASIX) 40 MG tablet Take 1 tablet (40 mg total) by mouth 2 (two) times daily. 30 tablet    gabapentin (NEURONTIN) 100 MG capsule Take 2 capsules (200 mg total) by mouth 2 (two) times daily. 60 capsule 1   isosorbide mononitrate (IMDUR) 30 MG 24 hr tablet Take 1 tablet (30 mg total) by mouth daily. 30 tablet 1   levalbuterol (XOPENEX HFA) 45 MCG/ACT inhaler Inhale 2 puffs into the lungs every 6 (six) hours as needed for wheezing  or shortness of breath.     levothyroxine (SYNTHROID) 75 MCG tablet Take 75 mcg by mouth daily before breakfast.     metoprolol succinate (TOPROL-XL) 25 MG 24 hr tablet Take 12.5 mg by mouth in the morning.     mexiletine (MEXITIL) 150 MG capsule Take 2 capsules (300 mg total) by mouth 2 (two) times daily. (Patient taking differently: Take 150 mg by mouth 2 (two) times daily.) 240 capsule 2   mometasone (ASMANEX) 220 MCG/INH inhaler Inhale 2 puffs into the lungs 2 (two) times daily.     montelukast (SINGULAIR) 10 MG tablet Take 10 mg by mouth at bedtime.     Multiple Vitamin (MULTIVITAMIN WITH MINERALS) TABS tablet Take 1 tablet by mouth daily with breakfast.      nitroGLYCERIN (NITROSTAT) 0.4 MG SL tablet Place 0.4 mg under the tongue every 5 (five) minutes as needed for chest pain.      Omega-3 Fatty Acids (FISH OIL) 1000 MG CAPS Take 1,000 mg by mouth in the morning and at bedtime.     omeprazole (PRILOSEC) 20 MG capsule Take 20 mg by mouth in the morning and at bedtime.     OXYGEN Inhale 4 L into the lungs continuous.     potassium chloride SA (KLOR-CON) 20 MEQ tablet Take 1 tablet (20 mEq total) by mouth 2 (two) times daily. 60 tablet 6   Propylene Glycol (SYSTANE BALANCE) 0.6 % SOLN Place 1 drop into both eyes 4 (four) times daily.     rosuvastatin (CRESTOR) 40 MG tablet Take 20 mg by mouth at bedtime.     Tiotropium Bromide-Olodaterol 2.5-2.5 MCG/ACT AERS Inhale 1 puff into the lungs in the morning and at bedtime.     No facility-administered medications prior to visit.    PAST MEDICAL HISTORY: Past Medical History:  Diagnosis Date   Arthritis    "elbows, knees" (02/24/2016)   Bronchitis 2006   CAD (coronary artery disease)    a. s/p prior MIs - 1995 x 2, 1998; b. s/p prior LCX stenting; c. 04/2019 Cath: LM min irregs, LAD 65p/mi, D1 75, LCX 99ost/p, 54m (ISR), OM2 80, RCA/RPDA mod diff dzs; d. 05/2019 CABG x 3: LIMA->LAD, VG->Diag, VG->OM.   COPD (chronic obstructive pulmonary disease) (HCC)    a. Remote tobacco-->on home O2.   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 05/2019 Echo: EF 40-45%, gr2 DD. Nl RV size/fxn. Mildly dil LA. Mild MR.   High cholesterol    Ischemic cardiomyopathy    a. 05/2019 Echo: EF 40-45%.   Morbid obesity (HCC)    Myocardial infarction (HCC) ~ 1995 X 2;1998; 2000; 2004   On home oxygen therapy    "2L w/activity" (02/24/2016)   PAF (paroxysmal atrial fibrillation) (HCC)    a. CHA2DS2VASc = 4-->eliquis. Also on amio.   Pulmonary embolism (HCC) 02/24/2016   Sleep apnea    "have CPAP; can't tolerate it" (02/24/2016)   Ventricular tachycardia  (HCC)    a. 2005 s/p ICD; b. 03/2011 Device upgrade/lead exchange: MDT Protecta XT VR single lead AICD.    PAST SURGICAL HISTORY: Past Surgical History:  Procedure Laterality Date   CARDIOVERSION N/A 09/11/2019   Procedure: CARDIOVERSION;  Surgeon: Jodelle Red, MD;  Location: [facility] ENDOSCOPY;  Service: Cardiovascular;  Laterality: N/A;   CLIPPING OF ATRIAL APPENDAGE N/A 05/23/2019   Procedure: CLIPPING OF ATRIAL APPENDAGE with atriclip;  Surgeon: [name], MD;  Location[facility]  OR;  Service: Open Heart Surgery;  Laterality: N/A;   CORONARY ANGIOPLASTY  1995   CORONARY ANGIOPLASTY WITH STENT PLACEMENT  ~ 1995 - 2004 X 5   "I've got a total of 5 stents" (02/24/2016)   CORONARY ARTERY BYPASS GRAFT N/A 05/23/2019   Procedure: CORONARY ARTERY BYPASS GRAFTING (CABG), times three, using right greater saphenous vein and left internal mammary;  Surgeon: [name], MD;  Location: [facility] OR;  Service: Open Heart Surgery;  Laterality: N/A;  [place] only   INSERT / REPLACE / REMOVE PACEMAKER  07/2003   original PPM around 2004 for ICM with EF < 35%   INTRAVASCULAR PRESSURE WIRE/FFR STUDY N/A 05/12/2019   Procedure: INTRAVASCULAR PRESSURE WIRE/FFR STUDY;  Surgeon: Lyn Records, MD;  Location: [facility]VASIVE CV LAB;  Service: Cardiovascular;  Laterality: N/A;   LEFT HEART CATH AND CORONARY ANGIOGRAPHY N/A 05/12/2019   Procedure: LEFT HEART CATH AND CORONARY ANGIOGRAPHY;  Surgeon: Lyn Records, MD;  Location: [facility] INVASIVE CV LAB;  Service: Cardiovascular;  Laterality: N/A;   PACEMAKER GENERATOR CHANGE  03/2011    [facility]   TEE WITHOUT CARDIOVERSION N/A 05/23/2019   Procedure: TRANSESOPHAGEAL ECHOCARDIOGRAM (TEE);  Surgeon: [name], MD;  Location: [facility] OR;  Service: Open Heart Surgery;  Laterality: N/A;   TONSILLECTOMY      FAMILY HISTORY: Family History  Problem Relation Age of Onset   Heart attack Mother    Heart attack Father    Heart attack Sister 23    SOCIAL HISTORY: Social  History   Socioeconomic History   Marital status: Divorced    Spouse name: Not on file   Number of children: Not on file   Years of education: Not on file   Highest education level: Not on file  Occupational History   Not on file  Tobacco Use   Smoking status: Former    Packs/day: 3.00    Years: 35.00    Total pack years: 105.00    Types: Cigarettes    Quit date: 05/22/2019    Years since quitting: 2.1   Smokeless tobacco: Former    Quit date: 05/21/2017  Vaping Use   Vaping Use: Never used  Substance and Sexual Activity   Alcohol use: Not Currently   Drug use: Not Currently    Types: Cocaine    Comment: none since 1995   Sexual activity: Yes  Other Topics Concern   Not on file  Social History Narrative   Not on file   Social Determinants of Health   Financial Resource Strain: Not on file  Food Insecurity: Not on file  Transportation Needs: Not on file  Physical Activity: Not on file  Stress: Not on file  Social Connections: Not on file  Intimate Partner Violence: Not on file      PHYSICAL EXAM  There were no vitals filed for this visit. There is no height or weight on file to calculate BMI.  Generalized: Well developed, in no acute distress   Neurological examination  Mentation: Alert oriented to time, place, history taking. Follows all commands speech and language fluent Cranial nerve II-XII: Pupils were equal round reactive to light. Extraocular movements were full, visual field were full on confrontational test. Facial sensation and strength were normal. Uvula tongue midline. Head turning and shoulder shrug  were normal and symmetric. Motor: The motor testing reveals 5 over 5 strength of all 4 extremities. Good symmetric motor tone is noted throughout.  Sensory: Sensory testing is  intact to soft touch on all 4 extremities. No evidence of extinction is noted.  Coordination: Cerebellar testing reveals good finger-nose-finger and heel-to-shin bilaterally.   Gait and station: Gait is normal. Tandem gait is normal. Romberg is negative. No drift is seen.  Reflexes: Deep tendon reflexes are symmetric and normal bilaterally.   DIAGNOSTIC DATA (LABS, IMAGING, TESTING) - I reviewed patient records, labs, notes, testing and imaging myself where available.  Lab Results  Component Value Date   WBC 6.3 06/25/2021   HGB 12.5 (L) 06/25/2021   HCT 38.1 (L) 06/25/2021   MCV 92.7 06/25/2021   PLT 131 (L) 06/25/2021      Component Value Date/Time   NA 141 06/25/2021 0048   NA 142 06/14/2019 0958   K 3.6 06/25/2021 0048   CL 109 06/25/2021 0048   CO2 24 06/25/2021 0048   GLUCOSE 95 06/25/2021 0048   BUN 10 06/25/2021 0048   BUN 10 06/14/2019 0958   CREATININE 1.11 06/25/2021 0048   CALCIUM 8.4 (L) 06/25/2021 0048   PROT 6.3 (L) 06/24/2021 0100   ALBUMIN 3.4 (L) 06/24/2021 0100   AST 22 06/24/2021 0100   ALT 19 06/24/2021 0100   ALKPHOS 54 06/24/2021 0100   BILITOT 0.8 06/24/2021 0100   GFRNONAA >60 06/25/2021 0048   GFRAA >60 09/23/2019 0656   Lab Results  Component Value Date   CHOL 120 06/25/2021   HDL 43 06/25/2021   LDLCALC 57 06/25/2021   TRIG 101 06/25/2021   CHOLHDL 2.8 06/25/2021   Lab Results  Component Value Date   HGBA1C 5.6 06/24/2021   No results found for: "VITAMINB12" Lab Results  Component Value Date   TSH 2.139 06/24/2021      ASSESSMENT AND PLAN 71 y.o. 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 here with:  TIA    Continue Eliquis (apixaban) daily  and ASA 81 mg  for secondary stroke prevention.  Discussed secondary stroke prevention measures and importance of close PCP follow up for aggressive stroke risk factor management. I have gone over the pathophysiology of stroke, warning signs and symptoms, risk factors and their management in some detail with instructions to go to the closest emergency room for symptoms of concern. HTN: BP goal <130/90.   HLD: LDL goal <70. Recent LDL 57 on crestor .  DMII: A1c goal<7.0. Recent A1c 5.6.  CT head and neck right ICA 50% stenosis. Encouraged patient to monitor diet and encouraged exercise FU with our office PRN   Butch Penny, MSN, NP-C 07/30/2021, 4:05 PM [facility] [address], [address] [place], [place] [address] [phone]

## 2021-07-31 ENCOUNTER — Ambulatory Visit: Payer: Medicare Other | Admitting: Adult Health

## 2021-07-31 ENCOUNTER — Encounter: Payer: Self-pay | Admitting: Adult Health

## 2021-07-31 VITALS — BP 139/60 | HR 63 | Ht 71.0 in | Wt 221.4 lb

## 2021-07-31 DIAGNOSIS — G459 Transient cerebral ischemic attack, unspecified: Secondary | ICD-10-CM | POA: Diagnosis not present

## 2021-07-31 NOTE — Patient Instructions (Signed)
BP goal <130/90.   Cholesterol : LDL goal <70. Recent LDL 57 on crestor .  Diabetes A1c goal<7.0. Recent A1c 5.6.

## 2022-07-07 ENCOUNTER — Encounter (HOSPITAL_COMMUNITY): Payer: Self-pay | Admitting: Internal Medicine

## 2022-07-07 ENCOUNTER — Inpatient Hospital Stay (HOSPITAL_COMMUNITY)
Admission: EM | Admit: 2022-07-07 | Discharge: 2022-07-11 | DRG: 291 | Disposition: A | Discharge status: Home / Self Care | Payer: Medicare Other | Attending: Internal Medicine | Admitting: Internal Medicine

## 2022-07-07 ENCOUNTER — Emergency Department (HOSPITAL_COMMUNITY): Payer: Medicare Other

## 2022-07-07 DIAGNOSIS — Z8673 Personal history of transient ischemic attack (TIA), and cerebral infarction without residual deficits: Secondary | ICD-10-CM

## 2022-07-07 DIAGNOSIS — J9621 Acute and chronic respiratory failure with hypoxia: Secondary | ICD-10-CM | POA: Diagnosis present

## 2022-07-07 DIAGNOSIS — I5031 Acute diastolic (congestive) heart failure: Secondary | ICD-10-CM | POA: Diagnosis not present

## 2022-07-07 DIAGNOSIS — G4733 Obstructive sleep apnea (adult) (pediatric): Secondary | ICD-10-CM | POA: Diagnosis present

## 2022-07-07 DIAGNOSIS — I252 Old myocardial infarction: Secondary | ICD-10-CM

## 2022-07-07 DIAGNOSIS — J449 Chronic obstructive pulmonary disease, unspecified: Secondary | ICD-10-CM | POA: Diagnosis present

## 2022-07-07 DIAGNOSIS — F39 Unspecified mood [affective] disorder: Secondary | ICD-10-CM | POA: Diagnosis present

## 2022-07-07 DIAGNOSIS — J189 Pneumonia, unspecified organism: Secondary | ICD-10-CM

## 2022-07-07 DIAGNOSIS — R0602 Shortness of breath: Secondary | ICD-10-CM | POA: Diagnosis present

## 2022-07-07 DIAGNOSIS — T380X5A Adverse effect of glucocorticoids and synthetic analogues, initial encounter: Secondary | ICD-10-CM | POA: Diagnosis not present

## 2022-07-07 DIAGNOSIS — J441 Chronic obstructive pulmonary disease with (acute) exacerbation: Secondary | ICD-10-CM | POA: Diagnosis present

## 2022-07-07 DIAGNOSIS — Z87891 Personal history of nicotine dependence: Secondary | ICD-10-CM

## 2022-07-07 DIAGNOSIS — I2510 Atherosclerotic heart disease of native coronary artery without angina pectoris: Secondary | ICD-10-CM | POA: Diagnosis present

## 2022-07-07 DIAGNOSIS — I420 Dilated cardiomyopathy: Secondary | ICD-10-CM | POA: Diagnosis present

## 2022-07-07 DIAGNOSIS — Z86711 Personal history of pulmonary embolism: Secondary | ICD-10-CM

## 2022-07-07 DIAGNOSIS — I425 Other restrictive cardiomyopathy: Secondary | ICD-10-CM | POA: Diagnosis present

## 2022-07-07 DIAGNOSIS — J438 Other emphysema: Secondary | ICD-10-CM

## 2022-07-07 DIAGNOSIS — Z7982 Long term (current) use of aspirin: Secondary | ICD-10-CM

## 2022-07-07 DIAGNOSIS — I5022 Chronic systolic (congestive) heart failure: Secondary | ICD-10-CM | POA: Diagnosis not present

## 2022-07-07 DIAGNOSIS — Z888 Allergy status to other drugs, medicaments and biological substances status: Secondary | ICD-10-CM

## 2022-07-07 DIAGNOSIS — E669 Obesity, unspecified: Secondary | ICD-10-CM | POA: Diagnosis present

## 2022-07-07 DIAGNOSIS — Z66 Do not resuscitate: Secondary | ICD-10-CM | POA: Diagnosis present

## 2022-07-07 DIAGNOSIS — I509 Heart failure, unspecified: Secondary | ICD-10-CM | POA: Diagnosis not present

## 2022-07-07 DIAGNOSIS — Z7951 Long term (current) use of inhaled steroids: Secondary | ICD-10-CM

## 2022-07-07 DIAGNOSIS — Z1152 Encounter for screening for COVID-19: Secondary | ICD-10-CM

## 2022-07-07 DIAGNOSIS — D72829 Elevated white blood cell count, unspecified: Secondary | ICD-10-CM | POA: Diagnosis not present

## 2022-07-07 DIAGNOSIS — J439 Emphysema, unspecified: Secondary | ICD-10-CM | POA: Diagnosis present

## 2022-07-07 DIAGNOSIS — I255 Ischemic cardiomyopathy: Secondary | ICD-10-CM | POA: Diagnosis present

## 2022-07-07 DIAGNOSIS — K219 Gastro-esophageal reflux disease without esophagitis: Secondary | ICD-10-CM | POA: Diagnosis present

## 2022-07-07 DIAGNOSIS — I480 Paroxysmal atrial fibrillation: Secondary | ICD-10-CM | POA: Diagnosis present

## 2022-07-07 DIAGNOSIS — I5023 Acute on chronic systolic (congestive) heart failure: Secondary | ICD-10-CM | POA: Diagnosis present

## 2022-07-07 DIAGNOSIS — I110 Hypertensive heart disease with heart failure: Secondary | ICD-10-CM | POA: Diagnosis present

## 2022-07-07 DIAGNOSIS — Z79890 Hormone replacement therapy (postmenopausal): Secondary | ICD-10-CM

## 2022-07-07 DIAGNOSIS — E039 Hypothyroidism, unspecified: Secondary | ICD-10-CM | POA: Diagnosis present

## 2022-07-07 DIAGNOSIS — Z7901 Long term (current) use of anticoagulants: Secondary | ICD-10-CM

## 2022-07-07 DIAGNOSIS — Z6831 Body mass index (BMI) 31.0-31.9, adult: Secondary | ICD-10-CM

## 2022-07-07 DIAGNOSIS — Z79899 Other long term (current) drug therapy: Secondary | ICD-10-CM

## 2022-07-07 DIAGNOSIS — Z95810 Presence of automatic (implantable) cardiac defibrillator: Secondary | ICD-10-CM

## 2022-07-07 DIAGNOSIS — M170 Bilateral primary osteoarthritis of knee: Secondary | ICD-10-CM | POA: Diagnosis present

## 2022-07-07 DIAGNOSIS — R0603 Acute respiratory distress: Secondary | ICD-10-CM | POA: Diagnosis not present

## 2022-07-07 DIAGNOSIS — M19022 Primary osteoarthritis, left elbow: Secondary | ICD-10-CM | POA: Diagnosis present

## 2022-07-07 DIAGNOSIS — Z8249 Family history of ischemic heart disease and other diseases of the circulatory system: Secondary | ICD-10-CM

## 2022-07-07 DIAGNOSIS — E7800 Pure hypercholesterolemia, unspecified: Secondary | ICD-10-CM | POA: Diagnosis present

## 2022-07-07 DIAGNOSIS — J9601 Acute respiratory failure with hypoxia: Secondary | ICD-10-CM | POA: Diagnosis not present

## 2022-07-07 DIAGNOSIS — I10 Essential (primary) hypertension: Secondary | ICD-10-CM | POA: Diagnosis present

## 2022-07-07 DIAGNOSIS — M19021 Primary osteoarthritis, right elbow: Secondary | ICD-10-CM | POA: Diagnosis present

## 2022-07-07 DIAGNOSIS — Z9981 Dependence on supplemental oxygen: Secondary | ICD-10-CM

## 2022-07-07 DIAGNOSIS — Z951 Presence of aortocoronary bypass graft: Secondary | ICD-10-CM | POA: Diagnosis not present

## 2022-07-07 DIAGNOSIS — Z955 Presence of coronary angioplasty implant and graft: Secondary | ICD-10-CM

## 2022-07-07 LAB — BASIC METABOLIC PANEL
Anion gap: 14 (ref 5–15)
BUN: 11 mg/dL (ref 8–23)
CO2: 20 mmol/L — ABNORMAL LOW (ref 22–32)
Calcium: 8.2 mg/dL — ABNORMAL LOW (ref 8.9–10.3)
Chloride: 103 mmol/L (ref 98–111)
Creatinine, Ser: 1.5 mg/dL — ABNORMAL HIGH (ref 0.61–1.24)
GFR, Estimated: 50 mL/min — ABNORMAL LOW (ref >60)
Glucose, Bld: 101 mg/dL — ABNORMAL HIGH (ref 70–99)
Potassium: 4.1 mmol/L (ref 3.5–5.1)
Sodium: 137 mmol/L (ref 135–145)

## 2022-07-07 LAB — CBC WITH DIFFERENTIAL/PLATELET
Abs Immature Granulocytes: 0.03 10*3/uL (ref 0.00–0.07)
Basophils Absolute: 0 10*3/uL (ref 0.0–0.1)
Basophils Relative: 1 %
Eosinophils Absolute: 0 10*3/uL (ref 0.0–0.5)
Eosinophils Relative: 1 %
HCT: 45.2 % (ref 39.0–52.0)
Hemoglobin: 14.5 g/dL (ref 13.0–17.0)
Immature Granulocytes: 0 %
Lymphocytes Relative: 18 %
Lymphs Abs: 1.3 10*3/uL (ref 0.7–4.0)
MCH: 30.9 pg (ref 26.0–34.0)
MCHC: 32.1 g/dL (ref 30.0–36.0)
MCV: 96.2 fL (ref 80.0–100.0)
Monocytes Absolute: 0.7 10*3/uL (ref 0.1–1.0)
Monocytes Relative: 9 %
Neutro Abs: 5.3 10*3/uL (ref 1.7–7.7)
Neutrophils Relative %: 71 %
Platelets: 200 10*3/uL (ref 150–400)
RBC: 4.7 MIL/uL (ref 4.22–5.81)
RDW: 14.9 % (ref 11.5–15.5)
WBC: 7.4 10*3/uL (ref 4.0–10.5)
nRBC: 0 % (ref 0.0–0.2)

## 2022-07-07 LAB — I-STAT CHEM 8, ED
BUN: 12 mg/dL (ref 8–23)
Calcium, Ion: 0.94 mmol/L — ABNORMAL LOW (ref 1.15–1.40)
Chloride: 104 mmol/L (ref 98–111)
Creatinine, Ser: 1.4 mg/dL — ABNORMAL HIGH (ref 0.61–1.24)
Glucose, Bld: 96 mg/dL (ref 70–99)
HCT: 44 % (ref 39.0–52.0)
Hemoglobin: 15 g/dL (ref 13.0–17.0)
Potassium: 3.9 mmol/L (ref 3.5–5.1)
Sodium: 138 mmol/L (ref 135–145)
TCO2: 27 mmol/L (ref 22–32)

## 2022-07-07 LAB — TROPONIN I (HIGH SENSITIVITY)
Troponin I (High Sensitivity): 12 ng/L (ref <18)
Troponin I (High Sensitivity): 15 ng/L (ref <18)

## 2022-07-07 LAB — BRAIN NATRIURETIC PEPTIDE: B Natriuretic Peptide: 497.6 pg/mL — ABNORMAL HIGH (ref 0.0–100.0)

## 2022-07-07 MED ORDER — AMIODARONE HCL 200 MG PO TABS
200.0000 mg | ORAL_TABLET | Freq: Every day | ORAL | Status: DC
  Administered 2022-07-08 – 2022-07-11 (×4): 200 mg via ORAL
  Filled 2022-07-07 (×4): qty 1

## 2022-07-07 MED ORDER — ONDANSETRON HCL 4 MG PO TABS: 4.0000 mg | ORAL_TABLET | Freq: Four times a day (QID) | ORAL | Status: DC | PRN

## 2022-07-07 MED ORDER — ACETAMINOPHEN 325 MG PO TABS: 650.0000 mg | ORAL_TABLET | Freq: Four times a day (QID) | ORAL | Status: DC | PRN

## 2022-07-07 MED ORDER — MOMETASONE FUROATE 220 MCG/INH IN AEPB: 2.0000 | INHALATION_SPRAY | Freq: Two times a day (BID) | RESPIRATORY_TRACT | Status: DC

## 2022-07-07 MED ORDER — BUDESONIDE 0.25 MG/2ML IN SUSP
0.2500 mg | Freq: Two times a day (BID) | RESPIRATORY_TRACT | Status: DC
  Administered 2022-07-07 – 2022-07-11 (×8): 0.25 mg via RESPIRATORY_TRACT
  Filled 2022-07-07 (×8): qty 2

## 2022-07-07 MED ORDER — AMITRIPTYLINE HCL 25 MG PO TABS
25.0000 mg | ORAL_TABLET | Freq: Every day | ORAL | Status: DC
  Administered 2022-07-07 – 2022-07-10 (×4): 25 mg via ORAL
  Filled 2022-07-07 (×4): qty 1

## 2022-07-07 MED ORDER — METHYLPREDNISOLONE SODIUM SUCC 125 MG IJ SOLR
125.0000 mg | Freq: Once | INTRAMUSCULAR | Status: AC
  Administered 2022-07-07: 125 mg via INTRAVENOUS
  Filled 2022-07-07: qty 2

## 2022-07-07 MED ORDER — LEVALBUTEROL HCL 1.25 MG/0.5ML IN NEBU
1.2500 mg | INHALATION_SOLUTION | Freq: Once | RESPIRATORY_TRACT | Status: AC
  Administered 2022-07-07: 1.25 mg via RESPIRATORY_TRACT
  Filled 2022-07-07: qty 0.5

## 2022-07-07 MED ORDER — LEVOTHYROXINE SODIUM 100 MCG PO TABS: 100.0000 ug | ORAL_TABLET | Freq: Every day | ORAL | Status: DC

## 2022-07-07 MED ORDER — ACETAMINOPHEN 650 MG RE SUPP: 650.0000 mg | Freq: Four times a day (QID) | RECTAL | Status: DC | PRN

## 2022-07-07 MED ORDER — LEVALBUTEROL HCL 0.63 MG/3ML IN NEBU: 0.6300 mg | INHALATION_SOLUTION | Freq: Four times a day (QID) | RESPIRATORY_TRACT | Status: DC | PRN

## 2022-07-07 MED ORDER — FUROSEMIDE 10 MG/ML IJ SOLN
80.0000 mg | Freq: Once | INTRAMUSCULAR | Status: AC
  Administered 2022-07-07: 80 mg via INTRAVENOUS
  Filled 2022-07-07: qty 8

## 2022-07-07 MED ORDER — POLYETHYLENE GLYCOL 3350 17 G PO PACK: 17.0000 g | PACK | Freq: Every day | ORAL | Status: DC | PRN

## 2022-07-07 MED ORDER — METOPROLOL SUCCINATE ER 25 MG PO TB24
12.5000 mg | ORAL_TABLET | Freq: Every day | ORAL | Status: DC
  Administered 2022-07-08: 12.5 mg via ORAL
  Filled 2022-07-07: qty 1

## 2022-07-07 MED ORDER — ISOSORBIDE MONONITRATE ER 30 MG PO TB24
30.0000 mg | ORAL_TABLET | Freq: Every day | ORAL | Status: DC
  Administered 2022-07-08: 30 mg via ORAL
  Filled 2022-07-07: qty 1

## 2022-07-07 MED ORDER — PREDNISONE 20 MG PO TABS
40.0000 mg | ORAL_TABLET | Freq: Every day | ORAL | Status: AC
  Administered 2022-07-07 – 2022-07-10 (×5): 40 mg via ORAL
  Filled 2022-07-07 (×4): qty 2

## 2022-07-07 MED ORDER — APIXABAN 5 MG PO TABS
5.0000 mg | ORAL_TABLET | Freq: Two times a day (BID) | ORAL | Status: DC
  Administered 2022-07-07 – 2022-07-11 (×8): 5 mg via ORAL
  Filled 2022-07-07 (×8): qty 1

## 2022-07-07 MED ORDER — GABAPENTIN 100 MG PO CAPS
200.0000 mg | ORAL_CAPSULE | Freq: Two times a day (BID) | ORAL | Status: DC
  Administered 2022-07-07 – 2022-07-11 (×8): 200 mg via ORAL
  Filled 2022-07-07 (×8): qty 2

## 2022-07-07 MED ORDER — CYCLOBENZAPRINE HCL 10 MG PO TABS
10.0000 mg | ORAL_TABLET | Freq: Every day | ORAL | Status: DC
  Administered 2022-07-08 – 2022-07-11 (×4): 10 mg via ORAL
  Filled 2022-07-07 (×4): qty 1

## 2022-07-07 MED ORDER — POLYVINYL ALCOHOL 1.4 % OP SOLN
1.0000 [drp] | Freq: Four times a day (QID) | OPHTHALMIC | Status: DC
  Administered 2022-07-08 – 2022-07-11 (×10): 1 [drp] via OPHTHALMIC
  Filled 2022-07-07: qty 15

## 2022-07-07 MED ORDER — MONTELUKAST SODIUM 10 MG PO TABS
10.0000 mg | ORAL_TABLET | Freq: Every day | ORAL | Status: DC
  Administered 2022-07-07 – 2022-07-10 (×4): 10 mg via ORAL
  Filled 2022-07-07 (×4): qty 1

## 2022-07-07 MED ORDER — LEVOTHYROXINE SODIUM 100 MCG PO TABS
100.0000 ug | ORAL_TABLET | Freq: Every day | ORAL | Status: DC
  Administered 2022-07-08 – 2022-07-11 (×4): 100 ug via ORAL
  Filled 2022-07-07 (×4): qty 1

## 2022-07-07 MED ORDER — FUROSEMIDE 10 MG/ML IJ SOLN
40.0000 mg | Freq: Two times a day (BID) | INTRAMUSCULAR | Status: DC
  Administered 2022-07-07 – 2022-07-10 (×6): 40 mg via INTRAVENOUS
  Filled 2022-07-07 (×6): qty 4

## 2022-07-07 MED ORDER — SODIUM CHLORIDE 0.9% FLUSH
3.0000 mL | Freq: Two times a day (BID) | INTRAVENOUS | Status: DC
  Administered 2022-07-08 – 2022-07-11 (×8): 3 mL via INTRAVENOUS

## 2022-07-07 MED ORDER — MAGNESIUM OXIDE -MG SUPPLEMENT 400 (240 MG) MG PO TABS
400.0000 mg | ORAL_TABLET | Freq: Every day | ORAL | Status: DC
  Administered 2022-07-08 – 2022-07-11 (×4): 400 mg via ORAL
  Filled 2022-07-07 (×4): qty 1

## 2022-07-07 MED ORDER — ONDANSETRON HCL 4 MG/2ML IJ SOLN: 4.0000 mg | Freq: Four times a day (QID) | INTRAMUSCULAR | Status: DC | PRN

## 2022-07-07 MED ORDER — ASPIRIN 81 MG PO TBEC
81.0000 mg | DELAYED_RELEASE_TABLET | Freq: Every day | ORAL | Status: DC
  Administered 2022-07-08 – 2022-07-11 (×4): 81 mg via ORAL
  Filled 2022-07-07 (×4): qty 1

## 2022-07-07 MED ORDER — SODIUM CHLORIDE 0.9 % IV SOLN
500.0000 mg | Freq: Once | INTRAVENOUS | Status: AC
  Administered 2022-07-07: 500 mg via INTRAVENOUS
  Filled 2022-07-07: qty 5

## 2022-07-07 MED ORDER — PANTOPRAZOLE SODIUM 40 MG PO TBEC
40.0000 mg | DELAYED_RELEASE_TABLET | Freq: Every day | ORAL | Status: DC
  Administered 2022-07-08 – 2022-07-11 (×4): 40 mg via ORAL
  Filled 2022-07-07 (×4): qty 1

## 2022-07-07 MED ORDER — ROSUVASTATIN CALCIUM 20 MG PO TABS
20.0000 mg | ORAL_TABLET | Freq: Every day | ORAL | Status: DC
  Administered 2022-07-07 – 2022-07-10 (×4): 20 mg via ORAL
  Filled 2022-07-07 (×4): qty 1

## 2022-07-07 MED ORDER — DOCUSATE SODIUM 100 MG PO CAPS
100.0000 mg | ORAL_CAPSULE | Freq: Two times a day (BID) | ORAL | Status: DC
  Administered 2022-07-07 – 2022-07-11 (×4): 100 mg via ORAL
  Filled 2022-07-07 (×6): qty 1

## 2022-07-07 MED ORDER — IPRATROPIUM BROMIDE 0.02 % IN SOLN
0.5000 mg | Freq: Four times a day (QID) | RESPIRATORY_TRACT | Status: DC
  Administered 2022-07-07 – 2022-07-08 (×3): 0.5 mg via RESPIRATORY_TRACT
  Filled 2022-07-07 (×3): qty 2.5

## 2022-07-07 MED ORDER — SODIUM CHLORIDE 0.9 % IV SOLN
1.0000 g | Freq: Once | INTRAVENOUS | Status: AC
  Administered 2022-07-07: 1 g via INTRAVENOUS
  Filled 2022-07-07: qty 10

## 2022-07-07 MED ORDER — HYDRALAZINE HCL 20 MG/ML IJ SOLN: 5.0000 mg | INTRAMUSCULAR | Status: DC | PRN

## 2022-07-07 MED ORDER — BISACODYL 5 MG PO TBEC: 5.0000 mg | DELAYED_RELEASE_TABLET | Freq: Every day | ORAL | Status: DC | PRN

## 2022-07-07 NOTE — ED Provider Notes (Addendum)
[place] EMERGENCY DEPARTMENT AT [facility] Provider Note   CSN: [id number] Arrival date & time: 07/07/22  1155     History  Chief Complaint  Patient presents with   Shortness of Breath    Max Rivas is a 72 y.o. male.  Patient is a 72 year old male who presents with shortness of breath.  He has a history of COPD, GERD, hyperlipidemia, prior PE, coronary artery disease status post stent and bypass surgery, paroxysmal atrial fibrillation on anticoagulants.  He states he has had some worsening shortness of breath over the last 2 to 3 days.  He has had some cough productive of some yellow-brown sputum.  He has some chronic leg swelling but says its unchanged from his baseline.  He denies any known fevers.  No associated chest pain or tightness.  He uses Xopenex at home and has been using that without improvement in symptoms.  He says he does not use albuterol because it makes his A-fib race.       Home Medications Prior to Admission medications   Medication Sig Start Date End Date Taking? Authorizing Provider  acetaminophen (TYLENOL) 500 MG tablet Take 500-1,000 mg by mouth every 6 (six) hours as needed for moderate pain, headache or mild pain.    [provider]  amiodarone (PACERONE) 200 MG tablet Take 1 tablet (200 mg total) by mouth 2 (two) times daily for 7 days, THEN 1 tablet (200 mg total) daily. 06/25/21 08/16/21  Elgergawy, Leana Roe, MD  apixaban (ELIQUIS) 5 MG TABS tablet Take 5 mg by mouth 2 (two) times daily.    [provider]  aspirin 81 MG chewable tablet Chew 1 tablet (81 mg total) by mouth daily. 05/16/19   Kroeger, Ovidio Kin., PA-C  Cholecalciferol (VITAMIN D3) 50 MCG (2000 UT) TABS Take 2,000 Units by mouth daily.    [provider]  furosemide (LASIX) 40 MG tablet Take 1 tablet (40 mg total) by mouth 2 (two) times daily. 06/28/21   Elgergawy, Leana Roe, MD  gabapentin (NEURONTIN) 100 MG capsule Take 2 capsules (200 mg total) by  mouth 2 (two) times daily. 06/01/19   Barrett, Erin R, PA-C  isosorbide mononitrate (IMDUR) 30 MG 24 hr tablet Take 1 tablet (30 mg total) by mouth daily. 03/13/21   Arthor Captain, PA-C  levalbuterol [facility] HFA) 45 MCG/ACT inhaler Inhale 2 puffs into the lungs every 6 (six) hours as needed for wheezing or shortness of breath.    [provider]  levothyroxine (SYNTHROID) 75 MCG tablet Take 75 mcg by mouth daily before breakfast.    [provider]  metoprolol succinate (TOPROL-XL) 25 MG 24 hr tablet Take 12.5 mg by mouth in the morning.    [provider]  mexiletine (MEXITIL) 150 MG capsule Take 2 capsules (300 mg total) by mouth 2 (two) times daily. Patient taking differently: Take 150 mg by mouth 2 (two) times daily. 12/07/19   Filbert Schilder, NP  mometasone [facility]) 220 MCG/INH inhaler Inhale 2 puffs into the lungs 2 (two) times daily.    [provider]  montelukast (SINGULAIR) 10 MG tablet Take 10 mg by mouth at bedtime.    [provider]  Multiple Vitamin (MULTIVITAMIN WITH MINERALS) TABS tablet Take 1 tablet by mouth daily with breakfast.    [provider]  nitroGLYCERIN (NITROSTAT) 0.4 MG SL tablet Place 0.4 mg under the tongue every 5 (five) minutes as needed for chest pain.     [provider]  Omega-3 Fatty Acids (FISH OIL) 1000 MG CAPS Take 1,000 mg by mouth in the morning and at bedtime.    [provider]  omeprazole (PRILOSEC) 20 MG capsule Take 20 mg by mouth in the morning and at bedtime.    [provider]  OXYGEN Inhale 4 L into the lungs continuous.    [provider]  potassium chloride SA (KLOR-CON) 20 MEQ tablet Take 1 tablet (20 mEq total) by mouth 2 (two) times daily. 03/19/20   Graciella Freer, PA-C  Propylene Glycol (SYSTANE BALANCE) 0.6 % SOLN Place 1 drop into both eyes 4 (four) times daily.    [provider]  rosuvastatin (CRESTOR) 40 MG tablet Take 20 mg by  mouth at bedtime.    [provider]  Tiotropium Bromide-Olodaterol 2.5-2.5 MCG/ACT AERS Inhale 1 puff into the lungs in the morning and at bedtime.    [provider]      Allergies    Crestor [rosuvastatin calcium], Rosuvastatin, Albuterol, and Atorvastatin    Review of Systems   Review of Systems  Constitutional:  Positive for fatigue. Negative for chills, diaphoresis and fever.  HENT:  Negative for congestion, rhinorrhea and sneezing.   Eyes: Negative.   Respiratory:  Positive for cough and shortness of breath. Negative for chest tightness.   Cardiovascular:  Negative for chest pain and leg swelling.  Gastrointestinal:  Negative for abdominal pain, blood in stool, diarrhea, nausea and vomiting.  Genitourinary:  Negative for difficulty urinating, flank pain, frequency and hematuria.  Musculoskeletal:  Negative for arthralgias and back pain.  Skin:  Negative for rash.  Neurological:  Negative for dizziness, speech difficulty, weakness, numbness and headaches.    Physical Exam Updated Vital Signs BP 135/75   Pulse 76   Temp 97.7 F (36.5 C)   Resp 20   Ht 5\' 11"  (1.803 m)   Wt 100.4 kg   SpO2 100%   BMI 30.87 kg/m  Physical Exam Constitutional:      Appearance: He is well-developed.  HENT:     Head: Normocephalic and atraumatic.  Eyes:     Pupils: Pupils are equal, round, and reactive to light.  Cardiovascular:     Rate and Rhythm: Normal rate and regular rhythm.     Heart sounds: Normal heart sounds.  Pulmonary:     Effort: Pulmonary effort is normal. Tachypnea present.     Breath sounds: Decreased breath sounds and wheezing present. No rales.  Chest:     Chest wall: No tenderness.  Abdominal:     General: Bowel sounds are normal.     Palpations: Abdomen is soft.     Tenderness: There is no abdominal tenderness. There is no guarding or rebound.  Musculoskeletal:        General: Normal range of motion.     Cervical back: Normal range of motion  and neck supple.     Comments: 2+ pitting edema to lower extremities bilaterally  Lymphadenopathy:     Cervical: No cervical adenopathy.  Skin:    General: Skin is warm and dry.     Findings: No rash.  Neurological:     Mental Status: He is alert and oriented to person, place, and time.     ED Results / Procedures / Treatments   Labs (all labs ordered are listed, but only abnormal results are displayed) Labs Reviewed  CBC WITH DIFFERENTIAL/PLATELET  BASIC METABOLIC PANEL  BRAIN NATRIURETIC PEPTIDE  I-STAT CHEM 8, ED  TROPONIN I (HIGH SENSITIVITY)  TROPONIN I (HIGH SENSITIVITY)    EKG None ED ECG REPORT   Date: 07/07/2022  Rate: 83  Rhythm: atrial fibrillation  QRS Axis: normal  Intervals: normal  ST/T Wave abnormalities: nonspecific ST/T changes  Conduction Disutrbances:nonspecific intraventricular conduction delay  Narrative Interpretation:   Old EKG Reviewed: unchanged, prolonged QT intervals which appears chronic  I have personally reviewed the EKG tracing and agree with the computerized printout as noted.   Radiology DG Chest Port 1 View  Result Date: 07/07/2022 CLINICAL DATA:  Shortness of breath x2 days EXAM: PORTABLE CHEST 1 VIEW COMPARISON:  Previous studies including the examination done on 04/25/2021 FINDINGS: Transverse diameter of heart is increased. Central pulmonary vessels are prominent. Increased interstitial markings are seen in the parahilar regions and lower lung fields. There is blunting of left lateral CP angle. Extensive pleural calcifications are seen in the periphery of right lung. Blunting of right lateral CP angle has not changed. There is no pneumothorax. Pacemaker/defibrillator battery is seen in left infraclavicular region. There is a metallic clamp in the region of left atrial appendage. Metallic sutures are seen in the sternum. IMPRESSION: Cardiomegaly. Central pulmonary vessels are prominent. Increased interstitial markings are seen in the  parahilar regions and lower lung fields suggesting interstitial pneumonia or pulmonary edema. There is new small left pleural effusion. Pleural calcifications in the periphery of right lung and blunting of right lateral CP angle have not changed suggesting scarring. Electronically Signed   By: Ernie Avena M.D.   On: 07/07/2022 13:37    Procedures Procedures    Medications Ordered in ED Medications  furosemide (LASIX) injection 80 mg (has no administration in time range)  levalbuterol (XOPENEX) nebulizer solution 1.25 mg (1.25 mg Nebulization Given 07/07/22 1223)  methylPREDNISolone sodium succinate (SOLU-MEDROL) 125 mg/2 mL injection 125 mg (125 mg Intravenous Given 07/07/22 1402)  levalbuterol (XOPENEX) nebulizer solution 1.25 mg (1.25 mg Nebulization Given 07/07/22 1402)    ED Course/ Medical Decision Making/ A&P                             Medical Decision Making Amount and/or Complexity of Data Reviewed Labs: ordered. Radiology: ordered.  Risk Prescription drug management. Decision regarding hospitalization.   Patient is a 72 year old who presents with shortness of breath.  He has some tachypnea and increased work of breathing on initial exam he was initially started on Xopenex because her treatment.  He was started on Solu-Medrol.  His chest x-ray which was interpreted by me and confirmed by the radiologist shows either diffuse interstitial markings consistent with pneumonia versus pulmonary edema.  There is been a significant delay in getting his labs back.  Will go ahead and start some Lasix.  He still has an increased oxygen demand.  His oxygen saturation is around 89% on 7 L.  He is on baseline oxygen at 4 L/min at home but sometimes will increase it to 6 L/min for brief time periods. Labs came back with an elevated BNP.  He does continue to have a little bit of a congested cough.  Will also start antibiotics.  I spoke with Dr. Kevan Ny who will admit the patient for further  treatment.  Final Clinical Impression(s) / ED Diagnoses Final diagnoses:  None    Rx / DC Orders ED Discharge Orders     None         Rolan Bucco, MD 07/07/22 1510  Rolan Bucco, MD 07/07/22 1614    Rolan Bucco, MD 07/07/22 [phone]

## 2022-07-07 NOTE — ED Notes (Signed)
ED TO INPATIENT HANDOFF REPORT  ED Nurse Name and Phone #:   S Name/Age/Gender Max Rivas 72 y.o. male Room/Bed: 044C/044C  Code Status   Code Status: DNR  Home/SNF/Other Home Patient oriented to: self, place, time, and situation Is this baseline? Yes   Triage Complete: Triage complete  Chief Complaint Acute on chronic respiratory failure with hypoxia (HCC) [J96.21]  Triage Note Pt to the ed from home with a CC of increased sob x 2 day. Pt is on 4 LPM continuously. Pt relays when walking to the bathroom this morning he had is 02 drop into the 60s and has required additional 02. Pt relays dizziness with the ambulation. Pt denies cp, LOC. Pt has COPD hx.     Allergies Allergies  Allergen Reactions   Crestor [Rosuvastatin Calcium] Other (See Comments)    Back spasms, initially, but can tolerate it at a low dose now, in 2022 Tolerating 20mg  QD   Lipitor [Atorvastatin] Other (See Comments)    Spasticity   Ventolin [Albuterol] Palpitations and Other (See Comments)    Irregular heartbeat    Level of Care/Admitting Diagnosis ED Disposition     ED Disposition  Admit   Condition  --   Comment  Hospital Area: [name] [[id number]]  Level of Care: Telemetry Cardiac [[id number]]  May admit patient to [facility] or [facility] if equivalent level of care is available:: No  Covid Evaluation: Asymptomatic - no recent exposure (last 10 days) testing not required  Diagnosis: Acute on chronic respiratory failure with hypoxia [facility]) [[id number]]  Admitting Physician: Jonah Blue [[id number]]  Attending Physician: Jonah Blue [[id number]]  Certification:: I certify this patient will need inpatient services for at least 2 midnights  Estimated Length of Stay: 3          B Medical/Surgery History Past Medical History:  Diagnosis Date   Arthritis    "elbows, knees" (02/24/2016)   Bronchitis 2006   CAD (coronary artery disease)    a. s/p prior MIs - 1995 x 2, 1998; b.  s/p prior LCX stenting; c. 04/2019 Cath: LM min irregs, LAD 65p/mi, D1 75, LCX 99ost/p, 60m (ISR), OM2 80, RCA/RPDA mod diff dzs; d. 05/2019 CABG x 3: LIMA->LAD, VG->Diag, VG->OM.   COPD (chronic obstructive pulmonary disease) (HCC)    a. Remote tobacco-->on home O2.   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 05/2019 Echo: EF 40-45%, gr2 DD. Nl RV size/fxn. Mildly dil LA. Mild MR.   High cholesterol    Ischemic cardiomyopathy    a. 05/2019 Echo: EF 40-45%.   Morbid obesity (HCC)    Myocardial infarction (HCC) ~ 1995 X 2;1998; 2000; 2004   On home oxygen therapy    "2L w/activity" (02/24/2016)   PAF (paroxysmal atrial fibrillation) (HCC)    a. CHA2DS2VASc = 4-->eliquis. Also on amio.   Pulmonary embolism (HCC) 02/24/2016   Sleep apnea    "have CPAP; can't tolerate it" (02/24/2016)   TIA (transient ischemic attack) 06/2021   Ventricular tachycardia (HCC)    a. 2005 s/p ICD; b. 03/2011 Device upgrade/lead exchange: MDT Protecta XT VR single lead AICD.   Past Surgical History:  Procedure Laterality Date   CARDIOVERSION N/A 09/11/2019   Procedure: CARDIOVERSION;  Surgeon: Jodelle Red, MD;  Location: [facility] ENDOSCOPY;  Service: Cardiovascular;  Laterality: N/A;   CLIPPING OF ATRIAL APPENDAGE N/A 05/23/2019   Procedure: CLIPPING OF ATRIAL APPENDAGE with atriclip;  Surgeon: [name], MD;  Location: [facility] OR;  Service: Open Heart Surgery;  Laterality: N/A;   CORONARY ANGIOPLASTY  1995   CORONARY ANGIOPLASTY WITH STENT PLACEMENT  ~ 1995 - 2004 X 5   "I've got a total of 5 stents" (02/24/2016)   CORONARY ARTERY BYPASS GRAFT N/A 05/23/2019   Procedure: CORONARY ARTERY BYPASS GRAFTING (CABG), times three, using right greater saphenous vein and left internal mammary;  Surgeon: [name], MD;  Location: [facility] OR;  Service: Open Heart Surgery;  Laterality: N/A;  [place] only   CORONARY PRESSURE/FFR STUDY N/A 05/12/2019    Procedure: INTRAVASCULAR PRESSURE WIRE/FFR STUDY;  Surgeon: Lyn Records, MD;  Location: [facility] INVASIVE CV LAB;  Service: Cardiovascular;  Laterality: N/A;   heart ablation  2022   INSERT / REPLACE / REMOVE PACEMAKER  07/2003   original PPM around 2004 for ICM with EF < 35%   LEFT HEART CATH AND CORONARY ANGIOGRAPHY N/A 05/12/2019   Procedure: LEFT HEART CATH AND CORONARY ANGIOGRAPHY;  Surgeon: Lyn Records, MD;  Location: [facility] INVASIVE CV LAB;  Service: Cardiovascular;  Laterality: N/A;   [facility]    TEE WITHOUT CARDIOVERSION N/A 05/23/2019   Procedure: TRANSESOPHAGEAL ECHOCARDIOGRAM (TEE);  Surgeon: [name], MD;  Location[facility] OR;  Service: Open Heart Surgery;  Laterality: N/A;   TONSILLECTOMY       A IV Location/Drains/Wounds Patient Lines/Drains/Airways Status     Active Line/Drains/Airways     Name Placement date Placement time Site Days   Peripheral IV 06/24/21 18 G Anterior;Distal;Left;Upper Arm 06/24/21  0101  Arm  378   Incision (Closed) 05/23/19 Chest 05/23/19  1045  -- 1141   Incision (Closed) 05/23/19 Leg 05/23/19  1045  -- 1141   Pressure Injury 05/26/19 Buttocks Right Deep Tissue Pressure Injury - Purple or maroon localized area of discolored intact skin or blood-filled blister due to damage of underlying soft tissue from pressure and/or shear. 05/26/19  1200  -- 1138            Intake/Output Last 24 hours No intake or output data in the 24 hours ending 07/07/22 1900  Labs/Imaging Results for orders placed or performed during the hospital encounter of 07/07/22 (from the past 48 hour(s))  Basic metabolic panel     Status: Abnormal   Collection Time: 07/07/22 12:12 PM  Result Value Ref Range   Sodium 137 135 - 145 mmol/L   Potassium 4.1 3.5 - 5.1 mmol/L   Chloride 103 98 - 111 mmol/L   CO2 20 (L) 22 - 32 mmol/L   Glucose, Bld 101 (H) 70 - 99 mg/dL    Comment: Glucose reference range applies only to samples taken  after fasting for at least 8 hours.   BUN 11 8 - 23 mg/dL   Creatinine, Ser 1.61 (H) 0.61 - 1.24 mg/dL   Calcium 8.2 (L) 8.9 - 10.3 mg/dL   GFR, Estimated 50 (L) >60 mL/min    Comment: (NOTE) Calculated using the CKD-EPI Creatinine Equation (2021)    Anion gap 14 5 - 15    Comment: Performed at [facility] Lab, [address], [place], [place] [address]  CBC with Differential     Status: None   Collection Time: 07/07/22 12:12 PM  Result Value Ref Range   WBC 7.4 4.0 - 10.5 K/uL   RBC 4.70 4.22 - 5.81 MIL/uL   Hemoglobin 14.5 13.0 - 17.0 g/dL  HCT 45.2 39.0 - 52.0 %   MCV 96.2 80.0 - 100.0 fL   MCH 30.9 26.0 - 34.0 pg   MCHC 32.1 30.0 - 36.0 g/dL   RDW 23.5 57.3 - 22.0 %   Platelets 200 150 - 400 K/uL   nRBC 0.0 0.0 - 0.2 %   Neutrophils Relative % 71 %   Neutro Abs 5.3 1.7 - 7.7 K/uL   Lymphocytes Relative 18 %   Lymphs Abs 1.3 0.7 - 4.0 K/uL   Monocytes Relative 9 %   Monocytes Absolute 0.7 0.1 - 1.0 K/uL   Eosinophils Relative 1 %   Eosinophils Absolute 0.0 0.0 - 0.5 K/uL   Basophils Relative 1 %   Basophils Absolute 0.0 0.0 - 0.1 K/uL   Immature Granulocytes 0 %   Abs Immature Granulocytes 0.03 0.00 - 0.07 K/uL    Comment: Performed at [facility] Lab, [address] [place], [place] [address]  Brain natriuretic peptide     Status: Abnormal   Collection Time: 07/07/22 12:12 PM  Result Value Ref Range   B Natriuretic Peptide 497.6 (H) 0.0 - 100.0 pg/mL    Comment: Performed at [facility] Lab, [address], [place], [place] [address]  Troponin I (High Sensitivity)     Status: None   Collection Time: 07/07/22 12:12 PM  Result Value Ref Range   Troponin I (High Sensitivity) 12 <18 ng/L    Comment: (NOTE) Elevated high sensitivity troponin I (hsTnI) values and significant  changes across serial measurements may suggest ACS but many other  chronic and acute conditions are known to elevate hsTnI results.  Refer to the "Links" section for chest pain  algorithms and additional  guidance. Performed at [facility] Lab, [address] [place], [place] [address]   I-stat chem 8, ED     Status: Abnormal   Collection Time: 07/07/22  2:31 PM  Result Value Ref Range   Sodium 138 135 - 145 mmol/L   Potassium 3.9 3.5 - 5.1 mmol/L   Chloride 104 98 - 111 mmol/L   BUN 12 8 - 23 mg/dL   Creatinine, Ser 5.17 (H) 0.61 - 1.24 mg/dL   Glucose, Bld 96 70 - 99 mg/dL    Comment: Glucose reference range applies only to samples taken after fasting for at least 8 hours.   Calcium, Ion 0.94 (L) 1.15 - 1.40 mmol/L   TCO2 27 22 - 32 mmol/L   Hemoglobin 15.0 13.0 - 17.0 g/dL   HCT 61.6 07.3 - 71.0 %  Troponin I (High Sensitivity)     Status: None   Collection Time: 07/07/22  2:40 PM  Result Value Ref Range   Troponin I (High Sensitivity) 15 <18 ng/L    Comment: (NOTE) Elevated high sensitivity troponin I (hsTnI) values and significant  changes across serial measurements may suggest ACS but many other  chronic and acute conditions are known to elevate hsTnI results.  Refer to the "Links" section for chest pain algorithms and additional  guidance. Performed at [facility] Lab, [address], [place], [place] [address]    DG Chest Port 1 View  Result Date: 07/07/2022 CLINICAL DATA:  Shortness of breath x2 days EXAM: PORTABLE CHEST 1 VIEW COMPARISON:  Previous studies including the examination done on 04/25/2021 FINDINGS: Transverse diameter of heart is increased. Central pulmonary vessels are prominent. Increased interstitial markings are seen in the parahilar regions and lower lung fields. There is blunting of left lateral  CP angle. Extensive pleural calcifications are seen in the periphery of right lung. Blunting of right lateral CP angle has not changed. There is no pneumothorax. Pacemaker/defibrillator battery is seen in left infraclavicular region. There is a metallic clamp in the region of left atrial appendage. Metallic sutures are seen in the  sternum. IMPRESSION: Cardiomegaly. Central pulmonary vessels are prominent. Increased interstitial markings are seen in the parahilar regions and lower lung fields suggesting interstitial pneumonia or pulmonary edema. There is new small left pleural effusion. Pleural calcifications in the periphery of right lung and blunting of right lateral CP angle have not changed suggesting scarring. Electronically Signed   By: Ernie Avena M.D.   On: 07/07/2022 13:37    Pending Labs Unresulted Labs (From admission, onward)     Start     Ordered   07/08/22 0500  Basic metabolic panel  Tomorrow morning,   R        07/07/22 1658   07/08/22 0500  CBC  Tomorrow morning,   R        07/07/22 1658   07/07/22 1651  Expectorated Sputum Assessment w Gram Stain, Rflx to Resp Cult  (COPD / Pneumonia / Cellulitis / Lower Extremity Wound)  Once,   R        07/07/22 1658   07/07/22 1649  HIV Antibody (routine testing w rflx)  (HIV Antibody (Routine testing w reflex) panel)  Once,   R        07/07/22 1658            Vitals/Pain Today's Vitals   07/07/22 1215 07/07/22 1223 07/07/22 1415 07/07/22 1617  BP:   139/80   Pulse:  76 81   Resp:  20 16   Temp:    97.7 F (36.5 C)  SpO2:  100% 94%   Weight: 100.4 kg     Height: 5\' 11"  (1.803 m)     PainSc: 0-No pain       Isolation Precautions No active isolations  Medications Medications  apixaban (ELIQUIS) tablet 5 mg (has no administration in time range)  furosemide (LASIX) injection 40 mg (has no administration in time range)  acetaminophen (TYLENOL) tablet 650 mg (has no administration in time range)    Or  acetaminophen (TYLENOL) suppository 650 mg (has no administration in time range)  docusate sodium (COLACE) capsule 100 mg (has no administration in time range)  polyethylene glycol (MIRALAX / GLYCOLAX) packet 17 g (has no administration in time range)  bisacodyl (DULCOLAX) EC tablet 5 mg (has no administration in time range)  ondansetron  (ZOFRAN) tablet 4 mg (has no administration in time range)    Or  ondansetron (ZOFRAN) injection 4 mg (has no administration in time range)  hydrALAZINE (APRESOLINE) injection 5 mg (has no administration in time range)  predniSONE (DELTASONE) tablet 40 mg (40 mg Oral Given 07/07/22 1805)  ipratropium (ATROVENT) nebulizer solution 0.5 mg (has no administration in time range)  levalbuterol (XOPENEX) nebulizer solution 0.63 mg (has no administration in time range)  sodium chloride flush (NS) 0.9 % injection 3 mL (has no administration in time range)  amiodarone (PACERONE) tablet 200 mg (has no administration in time range)  amitriptyline (ELAVIL) tablet 25 mg (has no administration in time range)  aspirin EC tablet 81 mg (has no administration in time range)  carboxymethylcellulose 1 % ophthalmic solution 1 drop (has no administration in time range)  cyclobenzaprine (FLEXERIL) tablet 10 mg (has no administration in time range)  gabapentin (  NEURONTIN) capsule 200 mg (has no administration in time range)  isosorbide mononitrate (IMDUR) 24 hr tablet 30 mg (has no administration in time range)  magnesium oxide (MAG-OX) tablet 400 mg (has no administration in time range)  metoprolol succinate (TOPROL-XL) 24 hr tablet 12.5 mg (has no administration in time range)  mometasone (ASMANEX) inhaler 2 puff (has no administration in time range)  montelukast (SINGULAIR) tablet 10 mg (has no administration in time range)  pantoprazole (PROTONIX) EC tablet 40 mg (has no administration in time range)  rosuvastatin (CRESTOR) tablet 20 mg (has no administration in time range)  levothyroxine (SYNTHROID) tablet 100 mcg (has no administration in time range)  levalbuterol (XOPENEX) nebulizer solution 1.25 mg (1.25 mg Nebulization Given 07/07/22 1223)  methylPREDNISolone sodium succinate (SOLU-MEDROL) 125 mg/2 mL injection 125 mg (125 mg Intravenous Given 07/07/22 1402)  levalbuterol (XOPENEX) nebulizer solution 1.25 mg  (1.25 mg Nebulization Given 07/07/22 1402)  furosemide (LASIX) injection 80 mg (80 mg Intravenous Given 07/07/22 1440)  cefTRIAXone (ROCEPHIN) 1 g in sodium chloride 0.9 % 100 mL IVPB (0 g Intravenous Stopped 07/07/22 1618)  azithromycin (ZITHROMAX) 500 mg in sodium chloride 0.9 % 250 mL IVPB (0 mg Intravenous Stopped 07/07/22 1716)    Mobility walks     Focused Assessments Pulmonary Assessment Handoff:  Lung sounds: Bilateral Breath Sounds: Rhonchi, Diminished O2 Device: Room Air O2 Flow Rate (L/min): 6 L/min    R Recommendations: See Admitting Provider Note  Report given to:   Additional Notes:

## 2022-07-07 NOTE — H&P (Signed)
History and Physical    Patient: Max Rivas ZOX:[id number] DOB: September 23, 1950 DOA: 07/07/2022 DOS: the patient was seen and examined on 07/07/2022 PCP: [facility]  Patient coming from: Home - lives with roommate; NOK: [facility] [place], [phone]   Chief Complaint: SOB  HPI: Max Rivas is a 72 y.o. male with medical history significant of CAD s/p CABG, COPD on 4L [place] O2, chronic systolic CHF, HLD, afib on Eliquis, and OSA not on CPAP presenting with SOB x 2 days.  He reports that Friday afternoon he was shopping at [employer], cooked, cleaned the kitchen.  Sunday afternoon, he couldn't walk from the bed to the bathroom  O2 dropped, HR 125, felt like he was going to pass out.  It has persisted since.  He can't even leave the house currently.  No edema, takes Lasix and "I pee like crazy."  He has gained 8-10 pounds in the last 1-2 weeks.  +cough starting yesterday, small "gobs" of clear sputum, not frothy.  At best, he can't breathe when lying flat.  He is in "final stage emphysema".    ER Course:  SOB.  H/o COPD, heart disease.  Wheezing and SOB.  On 4L home O2, hypoxic into 60s despite home O2.  On NRB -> 4L -> 80s -> 7L, now 89-90%.  Given Xopenex, Solumedrol with a little improvement.  CXR with probable edema.  +productive cough.  Given Lasix and antibiotics.  BNP in 400 range.     Review of Systems: As mentioned in the history of present illness. All other systems reviewed and are negative. Past Medical History:  Diagnosis Date   Arthritis    "elbows, knees" (02/24/2016)   Bronchitis 2006   CAD (coronary artery disease)    a. s/p prior MIs - 1995 x 2, 1998; b. s/p prior LCX stenting; c. 04/2019 Cath: LM min irregs, LAD 65p/mi, D1 75, LCX 99ost/p, 81m (ISR), OM2 80, RCA/RPDA mod diff dzs; d. 05/2019 CABG x 3: LIMA->LAD, VG->Diag, VG->OM.   COPD (chronic obstructive pulmonary disease) (HCC)    a. Remote tobacco-->on home O2.   GERD (gastroesophageal reflux  disease)    HFmrEF (heart failure with mid-range ejection fraction) (HCC)    a. 05/2019 Echo: EF 40-45%, gr2 DD. Nl RV size/fxn. Mildly dil LA. Mild MR.   High cholesterol    Ischemic cardiomyopathy    a. 05/2019 Echo: EF 40-45%.   Morbid obesity (HCC)    Myocardial infarction (HCC) ~ 1995 X 2;1998; 2000; 2004   On home oxygen therapy    "2L w/activity" (02/24/2016)   PAF (paroxysmal atrial fibrillation) (HCC)    a. CHA2DS2VASc = 4-->eliquis. Also on amio.   Pulmonary embolism (HCC) 02/24/2016   Sleep apnea    "have CPAP; can't tolerate it" (02/24/2016)   TIA (transient ischemic attack) 06/2021   Ventricular tachycardia (HCC)    a. 2005 s/p ICD; b. 03/2011 Device upgrade/lead exchange: MDT Protecta XT VR single lead AICD.   Past Surgical History:  Procedure Laterality Date   CARDIOVERSION N/A 09/11/2019   Procedure: CARDIOVERSION;  Surgeon: Jodelle Red, MD;  Location: [facility] ENDOSCOPY;  Service: Cardiovascular;  Laterality: N/A;   CLIPPING OF ATRIAL APPENDAGE N/A 05/23/2019   Procedure: CLIPPING OF ATRIAL APPENDAGE with atriclip;  Surgeon: [name], MD;  Location: [facility] OR;  Service: Open Heart Surgery;  Laterality: N/A;   CORONARY ANGIOPLASTY  1995   CORONARY ANGIOPLASTY WITH STENT PLACEMENT  ~ 1995 - 2004 X 5   "  I've got a total of 5 stents" (02/24/2016)   CORONARY ARTERY BYPASS GRAFT N/A 05/23/2019   Procedure: CORONARY ARTERY BYPASS GRAFTING (CABG), times three, using right greater saphenous vein and left internal mammary;  Surgeon: [name], MD;  Location: [facility] OR;  Service: Open Heart Surgery;  Laterality: N/A;  [place] only   CORONARY PRESSURE/FFR STUDY N/A 05/12/2019   Procedure: INTRAVASCULAR PRESSURE WIRE/FFR STUDY;  Surgeon: Lyn Records, MD;  Location: [facility] INVASIVE CV LAB;  Service: Cardiovascular;  Laterality: N/A;   heart ablation  2022   INSERT / REPLACE / REMOVE PACEMAKER  07/2003   original PPM around 2004 for ICM with EF < 35%   LEFT HEART CATH AND  CORONARY ANGIOGRAPHY N/A 05/12/2019   Procedure: LEFT HEART CATH AND CORONARY ANGIOGRAPHY;  Surgeon: Lyn Records, MD;  Location: [facility] INVASIVE CV LAB;  Service: Cardiovascular;  Laterality: N/A;   PACEMAKER GENERATOR CHANGE  03/2011    [facility]   TEE WITHOUT CARDIOVERSION N/A 05/23/2019   Procedure: TRANSESOPHAGEAL ECHOCARDIOGRAM (TEE);  Surgeon: [name], MD;  Location: [facility] OR;  Service: Open Heart Surgery;  Laterality: N/A;   TONSILLECTOMY     Social History:  reports that he quit smoking about 3 years ago. His smoking use included cigarettes. He has a 105.00 pack-year smoking history. He quit smokeless tobacco use about 5 years ago. He reports that he does not currently use alcohol. He reports that he does not currently use drugs after having used the following drugs: Cocaine.  Allergies  Allergen Reactions   Crestor [Rosuvastatin Calcium] Other (See Comments)    Back spasms, initially, but can tolerate it at a low dose now, in 2022 Tolerating 20mg  QD   Lipitor [Atorvastatin] Other (See Comments)    Spasticity   Ventolin [Albuterol] Palpitations and Other (See Comments)    Irregular heartbeat    Family History  Problem Relation Age of Onset   Heart attack Mother    Heart attack Father    Heart attack Sister 65    Prior to Admission medications   Medication Sig Start Date End Date Taking? Authorizing Provider  acetaminophen (TYLENOL) 500 MG tablet Take 500-1,000 mg by mouth every 6 (six) hours as needed for moderate pain, headache or mild pain.    [provider]  amiodarone (PACERONE) 200 MG tablet Take 1 tablet (200 mg total) by mouth 2 (two) times daily for 7 days, THEN 1 tablet (200 mg total) daily. 06/25/21 08/16/21  Elgergawy, Leana Roe, MD  apixaban (ELIQUIS) 5 MG TABS tablet Take 5 mg by mouth 2 (two) times daily.    [provider]  aspirin 81 MG chewable tablet Chew 1 tablet (81 mg total) by mouth daily. 05/16/19   Kroeger, Ovidio Kin., PA-C   Cholecalciferol (VITAMIN D3) 50 MCG (2000 UT) TABS Take 2,000 Units by mouth daily.    [provider]  furosemide (LASIX) 40 MG tablet Take 1 tablet (40 mg total) by mouth 2 (two) times daily. 06/28/21   Elgergawy, Leana Roe, MD  gabapentin (NEURONTIN) 100 MG capsule Take 2 capsules (200 mg total) by mouth 2 (two) times daily. 06/01/19   Barrett, Erin R, PA-C  isosorbide mononitrate (IMDUR) 30 MG 24 hr tablet Take 1 tablet (30 mg total) by mouth daily. 03/13/21   Arthor Captain, PA-C  levalbuterol [facility] HFA) 45 MCG/ACT inhaler Inhale 2 puffs into the lungs every 6 (six) hours as needed for wheezing or shortness of breath.  [provider]  levothyroxine (SYNTHROID) 75 MCG tablet Take 75 mcg by mouth daily before breakfast.    [provider]  metoprolol succinate (TOPROL-XL) 25 MG 24 hr tablet Take 12.5 mg by mouth in the morning.    [provider]  mexiletine (MEXITIL) 150 MG capsule Take 2 capsules (300 mg total) by mouth 2 (two) times daily. Patient taking differently: Take 150 mg by mouth 2 (two) times daily. 12/07/19   Filbert Schilder, NP  mometasone [facility]) 220 MCG/INH inhaler Inhale 2 puffs into the lungs 2 (two) times daily.    [provider]  montelukast (SINGULAIR) 10 MG tablet Take 10 mg by mouth at bedtime.    [provider]  Multiple Vitamin (MULTIVITAMIN WITH MINERALS) TABS tablet Take 1 tablet by mouth daily with breakfast.    [provider]  nitroGLYCERIN (NITROSTAT) 0.4 MG SL tablet Place 0.4 mg under the tongue every 5 (five) minutes as needed for chest pain.     [provider]  Omega-3 Fatty Acids (FISH OIL) 1000 MG CAPS Take 1,000 mg by mouth in the morning and at bedtime.    [provider]  omeprazole (PRILOSEC) 20 MG capsule Take 20 mg by mouth in the morning and at bedtime.    [provider]  OXYGEN Inhale 4 L into the lungs continuous.    [provider]  potassium  chloride SA (KLOR-CON) 20 MEQ tablet Take 1 tablet (20 mEq total) by mouth 2 (two) times daily. 03/19/20   Graciella Freer, PA-C  Propylene Glycol (SYSTANE BALANCE) 0.6 % SOLN Place 1 drop into both eyes 4 (four) times daily.    [provider]  rosuvastatin (CRESTOR) 40 MG tablet Take 20 mg by mouth at bedtime.    [provider]  Tiotropium Bromide-Olodaterol 2.5-2.5 MCG/ACT AERS Inhale 1 puff into the lungs in the morning and at bedtime.    [provider]    Physical Exam: Vitals:   07/07/22 1215 07/07/22 1223 07/07/22 1415 07/07/22 1617  BP:   139/80   Pulse:  76 81   Resp:  20 16   Temp:    97.7 F (36.5 C)  SpO2:  100% 94%   Weight: 100.4 kg     Height: 5\' 11"  (1.803 m)      General:  Appears calm and comfortable and is in NAD, chronically ill, on 6L [place] O2 Eyes:  EOMI, normal lids, iris ENT:  grossly normal hearing, lips & tongue, mmm; poor dentition Neck:  no LAD, masses or thyromegaly Cardiovascular:  RRR, no m/r/g. No LE edema.  Respiratory:   LLL crackles, RLL with very poor air movement.  Normal respiratory effort.  90% on 6L [place] O2 Abdomen:  soft, NT, ND Skin:  no rash or induration seen on limited exam Musculoskeletal:  grossly normal tone BUE/BLE, good ROM, no bony abnormality Psychiatric:  blunted mood and affect, speech fluent and appropriate, AOx3 Neurologic:  CN 2-12 grossly intact, moves all extremities in coordinated fashion   Radiological Exams on Admission: Independently reviewed - see discussion in A/P where applicable  DG Chest Port 1 View  Result Date: 07/07/2022 CLINICAL DATA:  Shortness of breath x2 days EXAM: PORTABLE CHEST 1 VIEW COMPARISON:  Previous studies including the examination done on 04/25/2021 FINDINGS: Transverse diameter of heart is increased. Central pulmonary vessels are prominent. Increased interstitial markings are seen in the parahilar regions and lower lung fields. There is blunting of left lateral CP  angle. Extensive pleural calcifications are seen in the periphery of right lung. Blunting of right lateral CP angle has not changed. There is no pneumothorax. Pacemaker/defibrillator battery is seen in left infraclavicular region. There is a metallic clamp in the region of left atrial appendage. Metallic sutures are seen in the sternum. IMPRESSION: Cardiomegaly. Central pulmonary vessels are prominent. Increased interstitial markings are seen in the parahilar regions and lower lung fields suggesting interstitial pneumonia or pulmonary edema. There is new small left pleural effusion. Pleural calcifications in the periphery of right lung and blunting of right lateral CP angle have not changed suggesting scarring. Electronically Signed   By: Ernie Avena M.D.   On: 07/07/2022 13:37    EKG: pending   Labs on Admission: I have personally reviewed the available labs and imaging studies at the time of the admission.  Pertinent labs:    CO2 20  Glucose 101 BUN 11/Creatinine 1.50/GFR 50 BNP 497.6; 275.6 in 06/2021 HS troponin 12, 15 Normal CBC   Assessment and Plan: Active Problems:   Hypercholesterolemia   Coronary artery disease   COPD (chronic obstructive pulmonary disease) (HCC)   Hypothyroidism   OSA (obstructive sleep apnea)   Chronic HFrEF (heart failure with reduced ejection fraction) (HCC)   Acute on chronic respiratory failure with hypoxia (HCC)   Essential hypertension   DNR (do not resuscitate)   PAF (paroxysmal atrial fibrillation) (HCC)    Acute on chronic respiratory failure with hypoxia -Patient on 4L home O2 presenting with acute decompensation -O2 sats at home were in the 60s on home O2 -He dropped to 80% with 4L in ER, currently on 6Lwith O2 sats near 90% -He reports 8-10 pound weight gain recently, likely related at least in part to CHF -No apparent infectious symptoms, beige sputum, low suspicion for acute infection but possible COPD exacerbation  Acute on  chronic systolic CHF -Patient with known h/o chronic systolic CHF presenting with worsening SOB and hypoxia -CXR consistent with mild pulmonary edema -Mildly elevated BNP compared with prior -With elevated BNP and abnl CXR, acute decompensated CHF seems probable as diagnosis -Will admit, as per the Emergency HF Mortality Risk Grade.  The patient has: severe pulmonary edema requiring increased O2 therap -Will request echocardiogram -CHF order set utilized; may need CHF team consult but will hold until Echo results are available -Was given Lasix 40 mg x 1 in ER and will repeat with 40 mg IV BID -Continue [place] O2 for now  COPD, possible exacerbation -Patient with end-stage COPD -Atypical symptoms but exacerbation is a possibility -Will hold antibiotics but give prednisone -Nebulizers: scheduled atrovent and prn Xopenex -Continue Asmanex  HTN -Continue Toprol Xl -Will also add prn hydralazine  HLD -Continue Crestor  Afib -Rate controlled with amiodarone, Toprol XL -Continue Eliquis  CAD -s/p CABG -Continue Imdur  OSA -Not on CPAP  Mood d/o -Continue amitriptyline  Hypothyroidism -Continue Synthroid at current dose for now   DNR -I have discussed code status with the patient and he would not desire resuscitation and would prefer to die a natural death should that situation arise. -He would prefer to continue to treat the treatable    Advance Care Planning:   Code Status: DNR   Consults: CHF navigator; TOC team; nutrition; PT/OT  DVT Prophylaxis: Eliquis  Family Communication: None present; he declined to have me call his friend at the time of admission  Severity of Illness: The appropriate patient status for this patient is INPATIENT. Inpatient status is judged to  be reasonable and necessary in order to provide the required intensity of service to ensure the patient's safety. The patient's presenting symptoms, physical exam findings, and initial radiographic and  laboratory data in the context of their chronic comorbidities is felt to place them at high risk for further clinical deterioration. Furthermore, it is not anticipated that the patient will be medically stable for discharge from the hospital within 2 midnights of admission.   * I certify that at the point of admission it is my clinical judgment that the patient will require inpatient hospital care spanning beyond 2 midnights from the point of admission due to high intensity of service, high risk for further deterioration and high frequency of surveillance required.*  Author: Jonah Blue, MD 07/07/2022 5:38 PM  For on call review [email or web address].

## 2022-07-07 NOTE — ED Notes (Signed)
Patient given something to eat and drink

## 2022-07-07 NOTE — ED Triage Notes (Signed)
Pt to the ed from home with a CC of increased sob x 2 day. Pt is on 4 LPM continuously. Pt relays when walking to the bathroom this morning he had is 02 drop into the 60s and has required additional 02. Pt relays dizziness with the ambulation. Pt denies cp, LOC. Pt has COPD hx.

## 2022-07-08 ENCOUNTER — Inpatient Hospital Stay (HOSPITAL_COMMUNITY): Payer: Medicare Other

## 2022-07-08 ENCOUNTER — Encounter (HOSPITAL_COMMUNITY): Payer: Self-pay | Admitting: Internal Medicine

## 2022-07-08 ENCOUNTER — Other Ambulatory Visit (HOSPITAL_COMMUNITY): Payer: No Typology Code available for payment source

## 2022-07-08 ENCOUNTER — Other Ambulatory Visit: Payer: Self-pay

## 2022-07-08 DIAGNOSIS — J9601 Acute respiratory failure with hypoxia: Secondary | ICD-10-CM

## 2022-07-08 DIAGNOSIS — I5031 Acute diastolic (congestive) heart failure: Secondary | ICD-10-CM | POA: Diagnosis not present

## 2022-07-08 LAB — BASIC METABOLIC PANEL
Anion gap: 11 (ref 5–15)
BUN: 11 mg/dL (ref 8–23)
CO2: 27 mmol/L (ref 22–32)
Calcium: 8.3 mg/dL — ABNORMAL LOW (ref 8.9–10.3)
Chloride: 100 mmol/L (ref 98–111)
Creatinine, Ser: 1.59 mg/dL — ABNORMAL HIGH (ref 0.61–1.24)
GFR, Estimated: 46 mL/min — ABNORMAL LOW (ref >60)
Glucose, Bld: 147 mg/dL — ABNORMAL HIGH (ref 70–99)
Potassium: 4.2 mmol/L (ref 3.5–5.1)
Sodium: 138 mmol/L (ref 135–145)

## 2022-07-08 LAB — ECHOCARDIOGRAM COMPLETE
Area-P 1/2: 4.49 cm2
Height: 71 in
MV M vel: 4.37 m/s
MV Peak grad: 76.4 mmHg
Radius: 0.3 cm
S' Lateral: 5.2 cm
Weight: 3569.69 oz

## 2022-07-08 LAB — CBC
HCT: 41.1 % (ref 39.0–52.0)
Hemoglobin: 13.6 g/dL (ref 13.0–17.0)
MCH: 30.3 pg (ref 26.0–34.0)
MCHC: 33.1 g/dL (ref 30.0–36.0)
MCV: 91.5 fL (ref 80.0–100.0)
Platelets: 208 10*3/uL (ref 150–400)
RBC: 4.49 MIL/uL (ref 4.22–5.81)
RDW: 14.8 % (ref 11.5–15.5)
WBC: 5.6 10*3/uL (ref 4.0–10.5)
nRBC: 0 % (ref 0.0–0.2)

## 2022-07-08 LAB — RESPIRATORY PANEL BY PCR

## 2022-07-08 LAB — HIV ANTIBODY (ROUTINE TESTING W REFLEX): HIV Screen 4th Generation wRfx: NONREACTIVE

## 2022-07-08 LAB — SARS CORONAVIRUS 2 BY RT PCR: SARS Coronavirus 2 by RT PCR: NEGATIVE

## 2022-07-08 MED ORDER — LEVALBUTEROL HCL 0.63 MG/3ML IN NEBU: 0.6300 mg | INHALATION_SOLUTION | Freq: Two times a day (BID) | RESPIRATORY_TRACT | Status: DC

## 2022-07-08 MED ORDER — ALUM & MAG HYDROXIDE-SIMETH 200-200-20 MG/5ML PO SUSP: 30.0000 mL | ORAL | Status: DC | PRN

## 2022-07-08 MED ORDER — PERFLUTREN LIPID MICROSPHERE
1.0000 mL | INTRAVENOUS | Status: AC | PRN
  Administered 2022-07-08: 6 mL via INTRAVENOUS

## 2022-07-08 MED ORDER — LORATADINE 10 MG PO TABS: 10.0000 mg | ORAL_TABLET | Freq: Every day | ORAL | Status: DC | PRN

## 2022-07-08 MED ORDER — IPRATROPIUM BROMIDE 0.02 % IN SOLN
0.5000 mg | Freq: Two times a day (BID) | RESPIRATORY_TRACT | Status: DC
  Administered 2022-07-08 – 2022-07-11 (×6): 0.5 mg via RESPIRATORY_TRACT
  Filled 2022-07-08 (×6): qty 2.5

## 2022-07-08 MED ORDER — LEVALBUTEROL HCL 0.63 MG/3ML IN NEBU
0.6300 mg | INHALATION_SOLUTION | Freq: Two times a day (BID) | RESPIRATORY_TRACT | Status: DC
  Administered 2022-07-08 – 2022-07-11 (×6): 0.63 mg via RESPIRATORY_TRACT
  Filled 2022-07-08 (×7): qty 3

## 2022-07-08 MED ORDER — HYDROCORTISONE 1 % EX CREA: 1.0000 | TOPICAL_CREAM | Freq: Three times a day (TID) | CUTANEOUS | Status: DC | PRN

## 2022-07-08 MED ORDER — POLYVINYL ALCOHOL 1.4 % OP SOLN: 1.0000 [drp] | OPHTHALMIC | Status: DC | PRN

## 2022-07-08 MED ORDER — PHENOL 1.4 % MT LIQD: 1.0000 | OROMUCOSAL | Status: DC | PRN

## 2022-07-08 MED ORDER — MUSCLE RUB 10-15 % EX CREA
1.0000 | TOPICAL_CREAM | CUTANEOUS | Status: DC | PRN
  Filled 2022-07-08: qty 85

## 2022-07-08 MED ORDER — SALINE SPRAY 0.65 % NA SOLN: 1.0000 | NASAL | Status: DC | PRN

## 2022-07-08 MED ORDER — SENNOSIDES-DOCUSATE SODIUM 8.6-50 MG PO TABS: 1.0000 | ORAL_TABLET | Freq: Every evening | ORAL | Status: DC | PRN

## 2022-07-08 MED ORDER — ADULT MULTIVITAMIN W/MINERALS CH
1.0000 | ORAL_TABLET | Freq: Every day | ORAL | Status: DC
  Administered 2022-07-08 – 2022-07-11 (×4): 1 via ORAL
  Filled 2022-07-08 (×4): qty 1

## 2022-07-08 MED ORDER — HYDRALAZINE HCL 20 MG/ML IJ SOLN: 10.0000 mg | INTRAMUSCULAR | Status: DC | PRN

## 2022-07-08 MED ORDER — IPRATROPIUM BROMIDE 0.02 % IN SOLN: 0.5000 mg | Freq: Two times a day (BID) | RESPIRATORY_TRACT | Status: DC

## 2022-07-08 MED ORDER — METOPROLOL TARTRATE 5 MG/5ML IV SOLN: 5.0000 mg | INTRAVENOUS | Status: DC | PRN

## 2022-07-08 MED ORDER — CALCIUM GLUCONATE-NACL 2-0.675 GM/100ML-% IV SOLN
2.0000 g | Freq: Once | INTRAVENOUS | Status: AC
  Administered 2022-07-08: 2000 mg via INTRAVENOUS
  Filled 2022-07-08 (×2): qty 100

## 2022-07-08 MED ORDER — ONDANSETRON HCL 4 MG/2ML IJ SOLN: 4.0000 mg | Freq: Four times a day (QID) | INTRAMUSCULAR | Status: DC | PRN

## 2022-07-08 MED ORDER — HYDROCORTISONE (PERIANAL) 2.5 % EX CREA: 1.0000 | TOPICAL_CREAM | Freq: Four times a day (QID) | CUTANEOUS | Status: DC | PRN

## 2022-07-08 MED ORDER — GUAIFENESIN 100 MG/5ML PO LIQD: 5.0000 mL | ORAL | Status: DC | PRN

## 2022-07-08 MED ORDER — TRAZODONE HCL 50 MG PO TABS
50.0000 mg | ORAL_TABLET | Freq: Every evening | ORAL | Status: DC | PRN
  Administered 2022-07-10: 50 mg via ORAL
  Filled 2022-07-08: qty 1

## 2022-07-08 NOTE — TOC Initial Note (Addendum)
Transition of Care [facility]) - Initial/Assessment Note    Patient Details  Name: Max Rivas MRN: [id number] Date of Birth: September 09, 1950  Transition of [facility] CM/SW Contact:    Leone Haven, RN Phone Number: 07/08/2022, 4:11 PM  Clinical Narrative:                 From home with room mate, Debbie, he has PCP, Dr. Conception Oms,  with [facility], fax [phone] and insurance on file.  He currently does not have any HH services in place .Per physical therapy eval rec outpt cardiopulmonary rehab.  Her has a rollator, shower stool, and  handicapped toilet.  He states his roommate, Eunice Blase will transport him home at dc.  Eunice Blase is also his support system.  [facility], [facility] [address]  Pta he is indep.   Expected Discharge Plan: Home/Self Care Barriers to Discharge: Continued Medical Work up   Patient Goals and CMS Choice Patient states their goals for this hospitalization and ongoing recovery are:: return home with room mate   Choice offered to / list presented to : NA      Expected Discharge Plan and Services In-house Referral: NA Discharge Planning Services: CM Consult Post Acute Care Choice: NA Living arrangements for the past 2 months: Single Family Home                 DME Arranged: N/A DME Agency: NA       HH Arranged: NA          Prior Living Arrangements/Services Living arrangements for the past 2 months: Single Family Home Lives with:: Roommate Patient language and need for interpreter reviewed:: Yes Do you feel safe going back to the place where you live?: Yes      Need for Family Participation in Patient Care: Yes (Comment) Care giver support system in place?: Yes (comment) Current home services: DME (rollator, shower stool, handicaped toilet,) Criminal Activity/Legal Involvement Pertinent to Current Situation/Hospitalization: No - Comment as needed  Activities of Daily  Living Home Assistive Devices/Equipment: None ADL Screening (condition at time of admission) Patient's cognitive ability adequate to safely complete daily activities?: Yes Is the patient deaf or have difficulty hearing?: No Does the patient have difficulty seeing, even when wearing glasses/contacts?: No Does the patient have difficulty concentrating, remembering, or making decisions?: No Patient able to express need for assistance with ADLs?: Yes Does the patient have difficulty dressing or bathing?: No Independently performs ADLs?: Yes (appropriate for developmental age) Does the patient have difficulty walking or climbing stairs?: No Weakness of Legs: None Weakness of Arms/Hands: None  Permission Sought/Granted Permission sought to share information with : Case Manager                Emotional Assessment Appearance:: Appears stated age Attitude/Demeanor/Rapport: Engaged Affect (typically observed): Appropriate Orientation: : Oriented to Self, Oriented to Place, Oriented to  Time, Oriented to Situation Alcohol / Substance Use: Not Applicable Psych Involvement: No (comment)  Admission diagnosis:  SOB (shortness of breath) [R06.02] Acute on chronic respiratory failure with hypoxia (HCC) [J96.21] Community acquired pneumonia, unspecified laterality [J18.9] Acute on chronic congestive heart failure, unspecified heart failure type [facility]) [I50.9] Patient Active Problem List   Diagnosis Date Noted   Acute on chronic respiratory failure with hypoxia (HCC) 07/07/2022   Essential hypertension 07/07/2022   DNR (do not resuscitate) 07/07/2022   PAF (paroxysmal atrial fibrillation) (  HCC) 07/07/2022   TIA (transient ischemic attack) 06/24/2021   Hypokalemia 06/24/2021   Sepsis (HCC) 04/25/2021   Ventricular tachyarrhythmia (HCC) 12/07/2019   Cardiomyopathy, ischemic    Vomiting    Acute respiratory failure with hypoxia (HCC) 09/18/2019   Acute on chronic heart failure (HCC)  09/08/2019   Unstable angina (HCC) 05/21/2019   Ventricular tachycardia (HCC) 05/12/2019   Chest pain    VT (ventricular tachycardia) (HCC) 05/11/2019   Arrhythmia 03/12/2019   On home O2    Chronic HFrEF (heart failure with reduced ejection fraction) (HCC) 06/22/2018   Chronic respiratory failure with hypoxia (HCC) 06/22/2018   Congestive heart failure (CHF) (HCC) 06/22/2018   DCM (dilated cardiomyopathy) (HCC) 05/25/2017   Coronary artery disease 05/25/2017   Chronic atrial fibrillation 05/25/2017   COPD (chronic obstructive pulmonary disease) (HCC) 05/25/2017   CKD (chronic kidney disease) 05/25/2017   Hypothyroidism 05/25/2017   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/2017   OSA (obstructive sleep apnea) 05/25/2017   Pulmonary HTN (HCC) 05/25/2017   Mediastinal adenopathy    Cervical adenopathy    Acute respiratory failure with hypoxia and hypercarbia (HCC) 02/24/2016   Pulmonary embolus (HCC) 02/24/2016   Pulmonary artery thrombosis (HCC)    Pleural effusion    Lung mass    Pleural plaque    Flutter-fibrillation (HCC) 01/15/2016   Acute systolic congestive heart failure (HCC)    Atrial fibrillation with RVR (HCC)    Hypercholesterolemia 03/11/2006   Tobacco abuse 03/11/2006   Acute on chronic systolic congestive heart failure (HCC) 03/11/2006   GASTROESOPHAGEAL REFLUX, NO ESOPHAGITIS 03/11/2006   OSTEOARTHRITIS, MULTI SITES 03/11/2006   HIGH RISK PATIENT 03/11/2006   Tobacco dependence 03/11/2006   PCP:  [facility] Pharmacy:   [id number] [place], [place] - [address] [place] [place] [address] Phone: [phone] Fax: [phone]  [facility] - [place], [place] - [address] [place] [address] Phone: [phone] Fax: [phone]     Social Determinants of Health (SDOH) Social History: SDOH Screenings   Food  Insecurity: No Food Insecurity (07/08/2022)  Housing: Low Risk  (07/08/2022)  Transportation Needs: No Transportation Needs (07/08/2022)  Utilities: Not At Risk (07/08/2022)  Alcohol Screen: Low Risk  (07/08/2022)  Financial Resource Strain: Low Risk  (07/08/2022)  Tobacco Use: Medium Risk (07/08/2022)   SDOH Interventions: Transportation Interventions: Intervention Not Indicated Alcohol Usage Interventions: Intervention Not Indicated (Score <7) Financial Strain Interventions: Intervention Not Indicated   Readmission Risk Interventions    06/25/2021    9:40 AM 04/28/2021   10:57 AM  Readmission Risk Prevention Plan  Transportation Screening Complete Complete  PCP or Specialist Appt within 3-5 Days Complete Complete  HRI or Home Care Consult Complete Complete  Social Work Consult for Recovery Care Planning/Counseling Complete Complete  Palliative Care Screening Not Applicable Not Applicable  Medication Review Oceanographer) Complete Complete

## 2022-07-08 NOTE — Evaluation (Signed)
Physical Therapy Evaluation Patient Details Name: HASON OFARRELL MRN: [id number] DOB: [birth year] Today's Date: 07/08/2022  History of Present Illness  Pt is a 72 year old man admitted on 6/25 with acute on chronic respiratory failure with hypoxia and dizziness. PMH: COPD on 4L at baseline (increases to 6L with exertion) CHF, PAF, GERD, HLD, PE, CAD with CABG, chronic LE swelling.  Clinical Impression  Pt admitted with above diagnosis. Educated on energy conservation, symptom awareness, and breathing techniques. Ambulatory distance limited to about 40 feet with 8L supplemental O2, initially at 94% however gradually decreased with distance to 87%. After sitting SpO2 dropped further to 79%. Increased supplemental O2 to 9L and improved >88% within 2 minutes. Slow and guarded with gait but without overt LOB or buckling. Pt currently with functional limitations due to the deficits listed below (see PT Problem List). Pt will benefit from acute skilled PT to increase their independence and safety with mobility to allow discharge.          Recommendations for follow up therapy are one component of a multi-disciplinary discharge planning process, led by the attending physician.  Recommendations may be updated based on patient status, additional functional criteria and insurance authorization.     Assistance Recommended at Discharge PRN  Patient can return home with the following  Assistance with cooking/housework;Assist for transportation;Help with stairs or ramp for entrance    Equipment Recommendations None recommended by PT  Recommendations for Other Services       Functional Status Assessment Patient has had a recent decline in their functional status and demonstrates the ability to make significant improvements in function in a reasonable and predictable amount of time.     Precautions / Restrictions Precautions Precautions: Other (comment) (droplet) Precaution Comments: watch  O2 Restrictions Weight Bearing Restrictions: No      Mobility  Bed Mobility               General bed mobility comments: in recliner    Transfers Overall transfer level: Independent Equipment used: None               General transfer comment: No assist needed    Ambulation/Gait Ambulation/Gait assistance: Supervision Gait Distance (Feet): 40 Feet Assistive device: None Gait Pattern/deviations: Step-through pattern Gait velocity: decr Gait velocity interpretation: <1.31 ft/sec, indicative of household ambulator   General Gait Details: Guarded gait, very slow but without overt LOB noted in room today. On 8L supplemental O2, SpO2 trended downward from 94% to 87% before needing to sit. (There was a drop to 79% after sitting.) Energy conservation techniques discussed, reviewed various devices to planning.  Stairs            Wheelchair Mobility    Modified Rankin (Stroke Patients Only)       Balance Overall balance assessment: Mild deficits observed, not formally tested                                           Pertinent Vitals/Pain Pain Assessment Pain Assessment: No/denies pain    Home Living Family/patient expects to be discharged to:: Private residence Living Arrangements: Non-relatives/Friends Available Help at Discharge: Friend(s);Available 24 hours/day Type of Home: Mobile home Home Access: Stairs to enter Entrance Stairs-Rails: Right;Left Entrance Stairs-Number of Steps: 5   Home Layout: One level Home Equipment: Educational psychologist (4 wheels);Other (comment);Cane - single point (O2) Additional Comments: 4L  O2 baseline    Prior Function Prior Level of Function : Independent/Modified Independent;Driving             Mobility Comments: Intermittent use of SPC       Hand Dominance   Dominant Hand: Right    Extremity/Trunk Assessment   Upper Extremity Assessment Upper Extremity Assessment: Defer to OT  evaluation    Lower Extremity Assessment Lower Extremity Assessment: Overall WFL for tasks assessed    Cervical / Trunk Assessment Cervical / Trunk Assessment: Normal (hernia)  Communication   Communication: No difficulties  Cognition Arousal/Alertness: Awake/alert Behavior During Therapy: WFL for tasks assessed/performed Overall Cognitive Status: Within Functional Limits for tasks assessed                                          General Comments General comments (skin integrity, edema, etc.): SpO2 on 8L dropped to 79% after ambulating while sitting in chair. Increased to 9L (starting point) and within 2 min improved to 88% and higher. Cues for breath techniques.    Exercises     Assessment/Plan    PT Assessment Patient needs continued PT services  PT Problem List Decreased strength;Decreased activity tolerance;Decreased mobility;Decreased balance;Decreased knowledge of use of DME;Cardiopulmonary status limiting activity       PT Treatment Interventions DME instruction;Gait training;Functional mobility training;Therapeutic activities;Stair training;Therapeutic exercise;Balance training;Neuromuscular re-education;Patient/family education;Wheelchair mobility training    PT Goals (Current goals can be found in the Care Plan section)  Acute Rehab PT Goals Patient Stated Goal: Get well, improve independence to PLOF. PT Goal Formulation: With patient Time For Goal Achievement: 07/15/22 Potential to Achieve Goals: Good    Frequency Min 1X/week     Co-evaluation               AM-PAC PT "6 Clicks" Mobility  Outcome Measure Help needed turning from your back to your side while in a flat bed without using bedrails?: None Help needed moving from lying on your back to sitting on the side of a flat bed without using bedrails?: None Help needed moving to and from a bed to a chair (including a wheelchair)?: None Help needed standing up from a chair using your  arms (e.g., wheelchair or bedside chair)?: None Help needed to walk in hospital room?: A Little Help needed climbing 3-5 steps with a railing? : A Little 6 Click Score: 22    End of Session Equipment Utilized During Treatment: Oxygen Activity Tolerance: Treatment limited secondary to medical complications (Comment) (Drop in O2) Patient left: in chair;with call bell/phone within reach   PT Visit Diagnosis: Other abnormalities of gait and mobility (R26.89);Difficulty in walking, not elsewhere classified (R26.2)    Time: 3664-4034 PT Time Calculation (min) (ACUTE ONLY): 20 min   Charges:   PT Evaluation $PT Eval Low Complexity: 1 Low          Kathlyn Sacramento, PT, DPT [facility]  Rehabilitation Services Physical Therapist Office: [phone] Website: [email or web address]   Berton Mount 07/08/2022, 2:10 PM

## 2022-07-08 NOTE — Progress Notes (Signed)
Heart Failure Nurse Navigator Progress Note  PCP: [facility]ologist: [facility] Diagnosis: None Admitted from: Home  Presentation:   Max Rivas presented with increased shortness of breath x 2 days,dizziness,  wears 4 L oxygen at home for his COPD. Some chronic bilateral leg swelling, BP 135/75, HR 76, BNP 497, IV lasix given,   Patient was educated on the sign and symptoms of heart failure, daily weights, when to call his doctor or go to the ED, Diet/ fluid restrictions, taking all his medications as prescribed, and attending all medical appointments, Patient verbalized his understanding of education. A HF TOC appointment was scheduled for 07/21/2022 @ 11 am.   ECHO/ LVEF: 40-45%  Clinical Course:  Past Medical History:  Diagnosis Date   Arthritis    "elbows, knees" (02/24/2016)   Bronchitis 2006   CAD (coronary artery disease)    a. s/p prior MIs - 1995 x 2, 1998; b. s/p prior LCX stenting; c. 04/2019 Cath: LM min irregs, LAD 65p/mi, D1 75, LCX 99ost/p, 71m (ISR), OM2 80, RCA/RPDA mod diff dzs; d. 05/2019 CABG x 3: LIMA->LAD, VG->Diag, VG->OM.   COPD (chronic obstructive pulmonary disease) (HCC)    a. Remote tobacco-->on home O2.   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 05/2019 Echo: EF 40-45%, gr2 DD. Nl RV size/fxn. Mildly dil LA. Mild MR.   High cholesterol    Ischemic cardiomyopathy    a. 05/2019 Echo: EF 40-45%.   Morbid obesity (HCC)    Myocardial infarction (HCC) ~ 1995 X 2;1998; 2000; 2004   On home oxygen therapy    "2L w/activity" (02/24/2016)   PAF (paroxysmal atrial fibrillation) (HCC)    a. CHA2DS2VASc = 4-->eliquis. Also on amio.   Pulmonary embolism (HCC) 02/24/2016   Sleep apnea    "have CPAP; can't tolerate it" (02/24/2016)   TIA (transient ischemic attack) 06/2021   Ventricular tachycardia (HCC)    a. 2005 s/p ICD; b. 03/2011 Device upgrade/lead exchange: MDT  Protecta XT VR single lead AICD.     Social History   Socioeconomic History   Marital status: Divorced    Spouse name: Not on file   Number of children: 1   Years of education: Not on file   Highest education level: Not on file  Occupational History   Occupation: Retired  Tobacco Use   Smoking status: Former    Packs/day: 3.00    Years: 35.00    Additional pack years: 0.00    Total pack years: 105.00    Types: Cigarettes    Quit date: 05/22/2019    Years since quitting: 3.1   Smokeless tobacco: Former    Quit date: 05/21/2017  Vaping Use   Vaping Use: Never used  Substance and Sexual Activity   Alcohol use: Not Currently   Drug use: Not Currently    Types: Cocaine    Comment: none since 1995   Sexual activity: Yes  Other Topics Concern   Not on file  Social History Narrative   Lives with room mate   Social Determinants of Health   Financial Resource Strain: Low Risk  (07/08/2022)   Overall Financial Resource Strain (CARDIA)    Difficulty of Paying Living Expenses: Not hard at all  Food Insecurity: No Food Insecurity (07/08/2022)   Hunger Vital Sign    Worried About Running Out of Food in the Last Year: Never true    Ran Out  of Food in the Last Year: Never true  Transportation Needs: No Transportation Needs (07/08/2022)   PRAPARE - Administrator, Civil Service (Medical): No    Lack of Transportation (Non-Medical): No  Physical Activity: Not on file  Stress: Not on file  Social Connections: Not on file   Education Assessment and Provision:  Detailed education and instructions provided on heart failure disease management including the following:  Signs and symptoms of Heart Failure When to call the physician Importance of daily weights Low sodium diet Fluid restriction Medication management Anticipated future follow-up appointments  Patient education given on each of the above topics.  Patient acknowledges understanding via teach back method and  acceptance of all instructions.  Education Materials:  "Living Better With Heart Failure" Booklet, HF zone tool, & Daily Weight Tracker Tool.  Patient has scale at home: yes Patient has pill box at home: yes    High Risk Criteria for Readmission and/or Poor Patient Outcomes: Heart failure hospital admissions (last 6 months): 0  No Show rate: 8 % Difficult social situation: No Demonstrates medication adherence: Yes Primary Language: English Literacy level: Reading, writing, and comprehension.  Barriers of Care:   Diet/ fluid restrictions Daily weights  Considerations/Referrals:   Referral made to Heart Failure Pharmacist Stewardship: Yes Referral made to Heart Failure CSW/NCM TOC: Yes, patient with concerns about the [facility] being aware of [facility] appointments and them covering them, so he doesn't get a bill.  Referral made to Heart & Vascular TOC clinic: Yes, HF TOC 07/21/2022 @ 11 am   Items for Follow-up on DC/TOC: Continued HF education Diet/ fluid restrictions Daily weights    Rhae Hammock, BSN, RN Heart Failure Print production planner Chat Only

## 2022-07-08 NOTE — Progress Notes (Signed)
PROGRESS NOTE    Max Rivas  ZDG:[id number] DOB: February 17, 1950 DOA: 07/07/2022 PCP: [facility]   Brief Narrative:  72 year old with history of CAD status post CABG, COPD on 4 L nasal cannula, chronic systolic CHF, A-fib on Eliquis, HLD, OSA comes to the hospital with shortness of breath ongoing for 2 days.  Upon admission chest x-ray showed pulmonary edema and had productive cough.  COVID/respiratory viral panel were negative.   Assessment & Plan:  Active Problems:   Hypercholesterolemia   Coronary artery disease   COPD (chronic obstructive pulmonary disease) (HCC)   Hypothyroidism   OSA (obstructive sleep apnea)   Chronic HFrEF (heart failure with reduced ejection fraction) (HCC)   Acute on chronic respiratory failure with hypoxia (HCC)   Essential hypertension   DNR (do not resuscitate)   PAF (paroxysmal atrial fibrillation) (HCC)     Acute on chronic respiratory failure with hypoxia, 4 L at home Still has bilateral rhonchi with expiratory wheezing Combination of infectious versus COPD versus CHF exacerbation Desaturated down to 80% on home 4 L nasal cannula.  Adjust as appropriate Bronchodilators scheduled and as needed I-S/flutter valve    Acute on chronic systolic CHF, EF 59%.  Class IV Echocardiogram in June 2023 showed EF of 45%, grade 3 DD/restrictive cardiomyopathy, dilated cardiomyopathy - Will continue patient on IV Lasix twice daily.  Fluid restriction.  Monitor and replete electrolytes as needed.   COPD, possible exacerbation Patient has known end-stage COPD - Continue steroids, I-S/flutter valve.  Scheduled and as needed bronchodilators.   HTN Toprol-XL, isosorbide mononitrate.  IV as needed   HLD -Continue Crestor   Paroxysmal atrial fibrillation Continue amiodarone, Toprol-XL and Eliquis   CAD status post CABG -Continue home medications aspirin and statin   OSA -Not on CPAP   Mood d/o -Continue amitriptyline    Hypothyroidism Synthroid   DNR Confirmed with family  DVT prophylaxis: Eliquis Code Status: Full code Family Communication:   Status is: Inpatient  Abnormal breath sounds.  Continue hospital stay      Diet Orders (From admission, onward)     Start     Ordered   07/07/22 1657  Diet Heart Room service appropriate? Yes; Fluid consistency: Thin; Fluid restriction: 1500 mL Fluid  Diet effective now       Question Answer Comment  Room service appropriate? Yes   Fluid consistency: Thin   Fluid restriction: 1500 mL Fluid      07/07/22 1658            Subjective: Still has exertional shortness of breath.   Examination:  General exam: Appears calm and comfortable ; 6LNC Respiratory system: Bibasilar rhonchi with expiratory wheezing especially at the bases Cardiovascular system: S1 & S2 heard, RRR. No JVD, murmurs, rubs, gallops or clicks. No pedal edema. Gastrointestinal system: Abdomen is nondistended, soft and nontender. No organomegaly or masses felt. Normal bowel sounds heard. Central nervous system: Alert and oriented. No focal neurological deficits. Extremities: Symmetric 5 x 5 power. Skin: No rashes, lesions or ulcers Psychiatry: Judgement and insight appear normal. Mood & affect appropriate.  Objective: Vitals:   07/07/22 2337 07/08/22 0252 07/08/22 0413 07/08/22 0735  BP: 103/66  119/78 129/71  Pulse: 77  80 72  Resp: 17  17 18   Temp: 97.8 F (36.6 C)  97.6 F (36.4 C) (!) 97.5 F (36.4 C)  TempSrc: Oral  Oral   SpO2: 97% 95% 92% 93%  Weight:   101.2 kg   Height:  No intake or output data in the 24 hours ending 07/08/22 0821 Filed Weights   07/07/22 1215 07/08/22 0413  Weight: 100.4 kg 101.2 kg    Scheduled Meds:  amiodarone  200 mg Oral Daily   amitriptyline  25 mg Oral QHS   apixaban  5 mg Oral BID   aspirin EC  81 mg Oral Daily   budesonide  0.25 mg Nebulization BID   cyclobenzaprine  10 mg Oral QPC breakfast   docusate sodium  100  mg Oral BID   furosemide  40 mg Intravenous BID   gabapentin  200 mg Oral BID   ipratropium  0.5 mg Nebulization Q6H   isosorbide mononitrate  30 mg Oral Daily   levothyroxine  100 mcg Oral Q0600   magnesium oxide  400 mg Oral Daily   metoprolol succinate  12.5 mg Oral Daily   montelukast  10 mg Oral QHS   pantoprazole  40 mg Oral Daily   polyvinyl alcohol  1 drop Both Eyes QID   predniSONE  40 mg Oral Q breakfast   rosuvastatin  20 mg Oral QHS   sodium chloride flush  3 mL Intravenous Q12H   Continuous Infusions:  Nutritional status     Body mass index is 31.12 kg/m.  Data Reviewed:   CBC: Recent Labs  Lab 07/07/22 1212 07/07/22 1431 07/08/22 0103  WBC 7.4  --  5.6  NEUTROABS 5.3  --   --   HGB 14.5 15.0 13.6  HCT 45.2 44.0 41.1  MCV 96.2  --  91.5  PLT 200  --  208   Basic Metabolic Panel: Recent Labs  Lab 07/07/22 1212 07/07/22 1431 07/08/22 0103  NA 137 138 138  K 4.1 3.9 4.2  CL 103 104 100  CO2 20*  --  27  GLUCOSE 101* 96 147*  BUN 11 12 11   CREATININE 1.50* 1.40* 1.59*  CALCIUM 8.2*  --  8.3*   GFR: Estimated Creatinine Clearance: 52.4 mL/min (A) (by C-G formula based on SCr of 1.59 mg/dL (H)). Liver Function Tests: No results for input(s): "AST", "ALT", "ALKPHOS", "BILITOT", "PROT", "ALBUMIN" in the last 168 hours. No results for input(s): "LIPASE", "AMYLASE" in the last 168 hours. No results for input(s): "AMMONIA" in the last 168 hours. Coagulation Profile: No results for input(s): "INR", "PROTIME" in the last 168 hours. Cardiac Enzymes: No results for input(s): "CKTOTAL", "CKMB", "CKMBINDEX", "TROPONINI" in the last 168 hours. BNP (last 3 results) No results for input(s): "PROBNP" in the last 8760 hours. HbA1C: No results for input(s): "HGBA1C" in the last 72 hours. CBG: No results for input(s): "GLUCAP" in the last 168 hours. Lipid Profile: No results for input(s): "CHOL", "HDL", "LDLCALC", "TRIG", "CHOLHDL", "LDLDIRECT" in the last  72 hours. Thyroid Function Tests: No results for input(s): "TSH", "T4TOTAL", "FREET4", "T3FREE", "THYROIDAB" in the last 72 hours. Anemia Panel: No results for input(s): "VITAMINB12", "FOLATE", "FERRITIN", "TIBC", "IRON", "RETICCTPCT" in the last 72 hours. Sepsis Labs: No results for input(s): "PROCALCITON", "LATICACIDVEN" in the last 168 hours.  No results found for this or any previous visit (from the past 240 hour(s)).       Radiology Studies: DG Chest Port 1 View  Result Date: 07/07/2022 CLINICAL DATA:  Shortness of breath x2 days EXAM: PORTABLE CHEST 1 VIEW COMPARISON:  Previous studies including the examination done on 04/25/2021 FINDINGS: Transverse diameter of heart is increased. Central pulmonary vessels are prominent. Increased interstitial markings are seen in the parahilar regions and lower lung  fields. There is blunting of left lateral CP angle. Extensive pleural calcifications are seen in the periphery of right lung. Blunting of right lateral CP angle has not changed. There is no pneumothorax. Pacemaker/defibrillator battery is seen in left infraclavicular region. There is a metallic clamp in the region of left atrial appendage. Metallic sutures are seen in the sternum. IMPRESSION: Cardiomegaly. Central pulmonary vessels are prominent. Increased interstitial markings are seen in the parahilar regions and lower lung fields suggesting interstitial pneumonia or pulmonary edema. There is new small left pleural effusion. Pleural calcifications in the periphery of right lung and blunting of right lateral CP angle have not changed suggesting scarring. Electronically Signed   By: Ernie Avena M.D.   On: 07/07/2022 13:37           LOS: 1 day   Time spent= 35 mins    Myelle Poteat Joline Maxcy, MD [facility]  If 7PM-7AM, please contact night-coverage  07/08/2022, 8:21 AM

## 2022-07-08 NOTE — Progress Notes (Signed)
Initial Nutrition Assessment  DOCUMENTATION CODES:   Obesity unspecified  INTERVENTION:  Encourage continuing adequate PO intake Double protein portions with meals MVI with minerals daily  NUTRITION DIAGNOSIS:   Increased nutrient needs related to chronic illness (COPD) as evidenced by estimated needs.  GOAL:   Patient will meet greater than or equal to 90% of their needs  MONITOR:   PO intake, Supplement acceptance, Labs, Weight trends  REASON FOR ASSESSMENT:   Consult Other (Comment) (nutrition goals)  ASSESSMENT:   Pt admitted from home with SOB. PMH significant for CAD s/p CABG, COPD on 4L, chronic systolic CHF, HLD, afib.  Xray with findings of pulmonary edema. Pt continues with treatment of COPD versus CHF exacerbation.   Spoke with pt at bedside. He endorses having a great diet with no significant changes to his meal intakes recently. He typically eats grits and tomatoes with toast for breakfast, snacks in the middle of the day on items such as popcorn or a salad, dinner is his best meal which includes baked chicken, chicken casserole or fish. He does not have a salt shaker at home and avoids added salt.   Meal completions: 6/26: 100% breakfast   Pt reports that his weight at his last outpatient appointment around 6/18 was 208 lbs and his current weight is noted to be 223 lbs. Dry weight unknown.   Medications: colace, lasix 40mg  IV BID, mag-ox, protonix, prednisone  Labs: Cr 1.59, ionized Ca 0.94, GFR 46  NUTRITION - FOCUSED PHYSICAL EXAM:  Flowsheet Row Most Recent Value  Orbital Region No depletion  Upper Arm Region No depletion  Thoracic and Lumbar Region No depletion  Buccal Region No depletion  Temple Region No depletion  Clavicle Bone Region No depletion  Clavicle and Acromion Bone Region No depletion  Scapular Bone Region No depletion  Dorsal Hand Mild depletion  Patellar Region No depletion  Anterior Thigh Region Moderate depletion  Posterior  Calf Region No depletion  Edema (RD Assessment) None  Hair Reviewed  Eyes Reviewed  Mouth Reviewed  Skin Reviewed  Nails Reviewed      Diet Order:   Diet Order             Diet Heart Room service appropriate? Yes; Fluid consistency: Thin; Fluid restriction: 1500 mL Fluid  Diet effective now                   EDUCATION NEEDS:   Education needs have been addressed  Skin:  Skin Assessment: Reviewed RN Assessment  Last BM:  PTA  Height:   Ht Readings from Last 1 Encounters:  07/07/22 5\' 11"  (1.803 m)    Weight:   Wt Readings from Last 1 Encounters:  07/08/22 101.2 kg    Ideal Body Weight:  78.2 kg  BMI:  Body mass index is 31.12 kg/m.  Estimated Nutritional Needs:   Kcal:  2000-2200  Protein:  100-115g  Fluid:  >/=2L  Drusilla Kanner, RDN, LDN Clinical Nutrition

## 2022-07-08 NOTE — Evaluation (Signed)
Occupational Therapy Evaluation and Discharge Patient Details Name: Max Rivas MRN: [id number] DOB: 1950/10/28 Today's Date: 07/08/2022   History of Present Illness Pt is a 72 year old man admitted on 6/25 with acute on chronic respiratory failure with hypoxia and dizziness. PMH: COPD on 4L at baseline (increases to 6L with exertion) CHF, PAF, GERD, HLD, PE, CAD with CABG, chronic LE swelling.   Clinical Impression   Pt is typically independent and drives. He lives with a supportive roommate. Pt is currently functioning modified independently as he has to pace himself due to fatigue. Pt is well aware of energy conservation strategies and pursed lip breathing techniques. He has and uses a shower seat. He is currently on 9L O2 with SpO2 for short distance ambulation in room of 92%. Pt reports he has palliative care nursing at home. No further OT needs.      Recommendations for follow up therapy are one component of a multi-disciplinary discharge planning process, led by the attending physician.  Recommendations may be updated based on patient status, additional functional criteria and insurance authorization.   Assistance Recommended at Discharge PRN  Patient can return home with the following Assistance with cooking/housework    Functional Status Assessment  Patient has had a recent decline in their functional status and demonstrates the ability to make significant improvements in function in a reasonable and predictable amount of time.  Equipment Recommendations  None recommended by OT    Recommendations for Other Services       Precautions / Restrictions Precautions Precautions: Other (comment) (droplet) Precaution Comments: watch O2      Mobility Bed Mobility Overal bed mobility: Modified Independent                  Transfers Overall transfer level: Independent Equipment used: None                      Balance Overall balance assessment: No apparent  balance deficits (not formally assessed)                                         ADL either performed or assessed with clinical judgement   ADL Overall ADL's : Modified independent                                             Vision Ability to See in Adequate Light: 0 Adequate Patient Visual Report: No change from baseline       Perception     Praxis      Pertinent Vitals/Pain Pain Assessment Pain Assessment: No/denies pain     Hand Dominance Right   Extremity/Trunk Assessment Upper Extremity Assessment Upper Extremity Assessment: Overall WFL for tasks assessed   Lower Extremity Assessment Lower Extremity Assessment: Defer to PT evaluation   Cervical / Trunk Assessment Cervical / Trunk Assessment: Normal (hernia)   Communication Communication Communication: No difficulties   Cognition Arousal/Alertness: Awake/alert Behavior During Therapy: WFL for tasks assessed/performed Overall Cognitive Status: Within Functional Limits for tasks assessed                                       General Comments  Exercises     Shoulder Instructions      Home Living Family/patient expects to be discharged to:: Private residence Living Arrangements: Non-relatives/Friends Available Help at Discharge: Friend(s);Available 24 hours/day Type of Home: Mobile home Home Access: Stairs to enter Entrance Stairs-Number of Steps: 5 Entrance Stairs-Rails: Right;Left Home Layout: One level     Bathroom Shower/Tub: Producer, television/film/video: Handicapped height     Home Equipment: Educational psychologist (4 wheels);Other (comment) (O2)          Prior Functioning/Environment Prior Level of Function : Independent/Modified Independent;Driving                        OT Problem List:        OT Treatment/Interventions:      OT Goals(Current goals can be found in the care plan section)    OT Frequency:       Co-evaluation              AM-PAC OT "6 Clicks" Daily Activity     Outcome Measure Help from another person eating meals?: None Help from another person taking care of personal grooming?: None Help from another person toileting, which includes using toliet, bedpan, or urinal?: None Help from another person bathing (including washing, rinsing, drying)?: None Help from another person to put on and taking off regular upper body clothing?: None Help from another person to put on and taking off regular lower body clothing?: None 6 Click Score: 24   End of Session Equipment Utilized During Treatment: Oxygen (9L)  Activity Tolerance: Patient tolerated treatment well Patient left: in chair;with call bell/phone within reach  OT Visit Diagnosis: Other (comment) (decreased activity tolerance)                Time: 1000-1025 OT Time Calculation (min): 25 min Charges:  OT General Charges $OT Visit: 1 Visit OT Evaluation $OT Eval Low Complexity: 1 Low OT Treatments $Self Care/Home Management : 8-22 mins  Berna Spare, OTR/L Acute Rehabilitation Services Office: [phone]   Evern Bio 07/08/2022, 10:28 AM

## 2022-07-09 DIAGNOSIS — R0603 Acute respiratory distress: Secondary | ICD-10-CM | POA: Diagnosis not present

## 2022-07-09 LAB — BASIC METABOLIC PANEL
Anion gap: 10 (ref 5–15)
BUN: 23 mg/dL (ref 8–23)
CO2: 26 mmol/L (ref 22–32)
Calcium: 8.4 mg/dL — ABNORMAL LOW (ref 8.9–10.3)
Chloride: 103 mmol/L (ref 98–111)
Creatinine, Ser: 1.76 mg/dL — ABNORMAL HIGH (ref 0.61–1.24)
GFR, Estimated: 41 mL/min — ABNORMAL LOW (ref >60)
Glucose, Bld: 126 mg/dL — ABNORMAL HIGH (ref 70–99)
Potassium: 3.9 mmol/L (ref 3.5–5.1)
Sodium: 139 mmol/L (ref 135–145)

## 2022-07-09 LAB — CBC
HCT: 40.5 % (ref 39.0–52.0)
Hemoglobin: 13.4 g/dL (ref 13.0–17.0)
MCH: 31 pg (ref 26.0–34.0)
MCHC: 33.1 g/dL (ref 30.0–36.0)
MCV: 93.8 fL (ref 80.0–100.0)
Platelets: 220 10*3/uL (ref 150–400)
RBC: 4.32 MIL/uL (ref 4.22–5.81)
RDW: 14.7 % (ref 11.5–15.5)
WBC: 14.5 10*3/uL — ABNORMAL HIGH (ref 4.0–10.5)
nRBC: 0 % (ref 0.0–0.2)

## 2022-07-09 LAB — PHOSPHORUS: Phosphorus: 4.3 mg/dL (ref 2.5–4.6)

## 2022-07-09 LAB — MAGNESIUM: Magnesium: 2.5 mg/dL — ABNORMAL HIGH (ref 1.7–2.4)

## 2022-07-09 NOTE — Progress Notes (Signed)
Physical Therapy Treatment Patient Details Name: Max Rivas MRN: [id number] DOB: 1950-08-25 Today's Date: 07/09/2022   History of Present Illness Pt is a 72 year old man admitted on 6/25 with acute on chronic respiratory failure with hypoxia and dizziness. PMH: COPD on 4L at baseline (increases to 6L with exertion) CHF, PAF, GERD, HLD, PE, CAD with CABG, chronic LE swelling.    PT Comments    Pt received in supine and agreeable to session. Pt's SpO2 noted to drop upon initial stand and improved with a seated rest break. Pt remained on 10 L for the remainder of the session. Pt able to increase gait distance with SpO2 remaining >89% for 96ft and then dropping as low as 70%. Pt requiring ~2 min seated break to recover >90%. Pt placed back on 9L O2 at end of session with SpO2 >93%. Pt reports feeling more off balance since admission and demonstrates some instability, but no LOB. Pt continues to benefit from PT services to progress toward functional mobility goals.    Recommendations for follow up therapy are one component of a multi-disciplinary discharge planning process, led by the attending physician.  Recommendations may be updated based on patient status, additional functional criteria and insurance authorization.     Assistance Recommended at Discharge PRN  Patient can return home with the following Assistance with cooking/housework;Assist for transportation;Help with stairs or ramp for entrance   Equipment Recommendations  None recommended by PT    Recommendations for Other Services       Precautions / Restrictions Precautions Precaution Comments: watch O2 Restrictions Weight Bearing Restrictions: No     Mobility  Bed Mobility Overal bed mobility: Modified Independent                  Transfers Overall transfer level: Independent Equipment used: None                    Ambulation/Gait Ambulation/Gait assistance: Supervision Gait Distance (Feet): 140  Feet Assistive device: None Gait Pattern/deviations: Step-through pattern Gait velocity: decr     General Gait Details: Pt demonstrating some instability, but no overt LOB       Balance Overall balance assessment: Mild deficits observed, not formally tested                                          Cognition Arousal/Alertness: Awake/alert Behavior During Therapy: WFL for tasks assessed/performed Overall Cognitive Status: Within Functional Limits for tasks assessed                                          Exercises General Exercises - Lower Extremity Long Arc Quad: AROM, Seated, Both, 5 reps Hip Flexion/Marching: AROM, Seated, Both, 5 reps    General Comments General comments (skin integrity, edema, etc.): SpO2 on 8L dropped into the 80's upon initial stand, so pt sat back to EOB and performed pursed lip breathing with SpO2 improving to >90%. O2 increased to 10L for ambulation. SpO2 for the first 32ft of gait trial 89-90% and then dropped as low at 70%. SpO2 improved to 95% after ~2 min seated rest break on 10L O2. O2 lowered back to starting 9L with SpO2 remaining >93%.      Pertinent Vitals/Pain Pain Assessment Pain Assessment: No/denies pain  PT Goals (current goals can now be found in the care plan section) Acute Rehab PT Goals Patient Stated Goal: Get well, improve independence to PLOF. PT Goal Formulation: With patient Time For Goal Achievement: 07/15/22 Potential to Achieve Goals: Good Progress towards PT goals: Progressing toward goals    Frequency    Min 1X/week      PT Plan Current plan remains appropriate       AM-PAC PT "6 Clicks" Mobility   Outcome Measure  Help needed turning from your back to your side while in a flat bed without using bedrails?: None Help needed moving from lying on your back to sitting on the side of a flat bed without using bedrails?: None Help needed moving to and from a bed to a chair  (including a wheelchair)?: None Help needed standing up from a chair using your arms (e.g., wheelchair or bedside chair)?: None Help needed to walk in hospital room?: A Little Help needed climbing 3-5 steps with a railing? : A Little 6 Click Score: 22    End of Session Equipment Utilized During Treatment: Oxygen Activity Tolerance: Treatment limited secondary to medical complications (Comment) (O2 drop) Patient left: in chair;with call bell/phone within reach Nurse Communication: Mobility status PT Visit Diagnosis: Other abnormalities of gait and mobility (R26.89);Difficulty in walking, not elsewhere classified (R26.2)     Time: 8657-8469 PT Time Calculation (min) (ACUTE ONLY): 27 min  Charges:  $Gait Training: 23-37 mins                     Johny Shock, PTA Acute Rehabilitation Services Secure Chat Preferred  [phone]    Johny Shock 07/09/2022, 9:16 AM

## 2022-07-09 NOTE — Progress Notes (Signed)
Mobility Specialist Progress Note:   07/09/22 1605  Mobility  Activity Ambulated independently in hallway  Level of Assistance Independent  Assistive Device None  Distance Ambulated (ft) 80 ft  Activity Response Tolerated well  Mobility Referral Yes  $Mobility charge 1 Mobility  Mobility Specialist Start Time (ACUTE ONLY) 1545  Mobility Specialist Stop Time (ACUTE ONLY) 1600  Mobility Specialist Time Calculation (min) (ACUTE ONLY) 15 min    Pre Mobility:  90% SpO2 9 L During Mobility:  77% SpO2 10 L Post Mobility: 91% SpO2 9 L  Pt received in bed, agreeable to mobility. Displayed audible SOB during ambulation. Pt desat to 77% with exertion, recovered with seated rest. Pt left EOB with call bell in hand and VSS on 9 L. RN notified.  Leory Plowman  Mobility Specialist Please contact via [employer] office at [phone]

## 2022-07-09 NOTE — Progress Notes (Signed)
PROGRESS NOTE    Max Rivas  NWG:[id number] DOB: September 14, 1950 DOA: 07/07/2022 PCP: [facility]   Brief Narrative:  72 year old with history of CAD status post CABG, COPD on 4 L nasal cannula, chronic systolic CHF, A-fib on Eliquis, HLD, OSA comes to the hospital with shortness of breath ongoing for 2 days.  Upon admission chest x-ray showed pulmonary edema and had productive cough.  COVID/respiratory viral panel were negative.  Upon admission started on bronchodilators, steroids and diuretics.   Assessment & Plan:  Active Problems:   Hypercholesterolemia   Coronary artery disease   COPD (chronic obstructive pulmonary disease) (HCC)   Hypothyroidism   OSA (obstructive sleep apnea)   Chronic HFrEF (heart failure with reduced ejection fraction) (HCC)   Acute on chronic respiratory failure with hypoxia (HCC)   Essential hypertension   DNR (do not resuscitate)   PAF (paroxysmal atrial fibrillation) (HCC)     Acute on chronic respiratory failure with hypoxia, 4 L at home Continues to have abnormal breath sounds but improved today.  On 8 L high flow Combination of infectious versus COPD versus CHF exacerbation Desaturated down to 80% on home 4 L nasal cannula.  Adjust as appropriate Bronchodilators scheduled and as needed I-S/flutter valve    Acute on chronic systolic CHF, EF 57%.  Class IV Echocardiogram in June 2023 showed EF of 45%, grade 3 DD/restrictive cardiomyopathy, dilated cardiomyopathy - Although renal function slowly trending up, continue IV diuretics given his significant oxygen requirement.   COPD, possible exacerbation Leukocytosis, secondary to steroids Patient has known end-stage COPD.  Continue scheduled and as needed bronchodilators.  I-S, flutter valve.  Continue steroids   HTN IV as needed.  Hold isosorbide mononitrate and Toprol-XL due to soft blood pressure   HLD -Continue Crestor   Paroxysmal atrial fibrillation Continue amiodarone,  Toprol-XL and Eliquis   CAD status post CABG -Continue home medications aspirin and statin   OSA -Not on CPAP   Mood d/o -Continue amitriptyline   Hypothyroidism Synthroid   DNR Confirmed with family  DVT prophylaxis: Eliquis Code Status: Full code Family Communication:   Status is: Inpatient  Abnormal breath sounds.  Continue hospital stay      Diet Orders (From admission, onward)     Start     Ordered   07/07/22 1657  Diet Heart Room service appropriate? Yes; Fluid consistency: Thin; Fluid restriction: 1500 mL Fluid  Diet effective now       Question Answer Comment  Room service appropriate? Yes   Fluid consistency: Thin   Fluid restriction: 1500 mL Fluid      07/07/22 1658            Subjective: Tells me his breathing is little better today, he was able to walk slightly father in the hallway.  But with ambulation still getting quite short of breath.  Examination: Constitutional: Not in acute distress; 7L [place] Respiratory: Some bilateral rhonchi at the bases Cardiovascular: Normal sinus rhythm, no rubs Abdomen: Nontender nondistended good bowel sounds Musculoskeletal: No edema noted Skin: No rashes seen Neurologic: CN 2-12 grossly intact.  And nonfocal Psychiatric: Normal judgment and insight. Alert and oriented x 3. Normal mood.  Objective: Vitals:   07/08/22 1954 07/08/22 2137 07/09/22 0054 07/09/22 0454  BP:  110/69 107/66 106/60  Pulse:  (!) 56 (!) 55 73  Resp:  18 18 18   Temp:  97.6 F (36.4 C) (!) 97.5 F (36.4 C) (!) 97.5 F (36.4 C)  TempSrc:  Oral  Oral Oral  SpO2: 94% 98% 95% 96%  Weight:    103 kg  Height:        Intake/Output Summary (Last 24 hours) at 07/09/2022 0743 Last data filed at 07/09/2022 0459 Gross per 24 hour  Intake 360 ml  Output 1650 ml  Net -1290 ml   Filed Weights   07/07/22 1215 07/08/22 0413 07/09/22 0454  Weight: 100.4 kg 101.2 kg 103 kg    Scheduled Meds:  amiodarone  200 mg Oral Daily   amitriptyline   25 mg Oral QHS   apixaban  5 mg Oral BID   aspirin EC  81 mg Oral Daily   budesonide  0.25 mg Nebulization BID   cyclobenzaprine  10 mg Oral QPC breakfast   docusate sodium  100 mg Oral BID   furosemide  40 mg Intravenous BID   gabapentin  200 mg Oral BID   ipratropium  0.5 mg Nebulization BID   isosorbide mononitrate  30 mg Oral Daily   levalbuterol  0.63 mg Nebulization BID   levothyroxine  100 mcg Oral Q0600   magnesium oxide  400 mg Oral Daily   metoprolol succinate  12.5 mg Oral Daily   montelukast  10 mg Oral QHS   multivitamin with minerals  1 tablet Oral Daily   pantoprazole  40 mg Oral Daily   polyvinyl alcohol  1 drop Both Eyes QID   predniSONE  40 mg Oral Q breakfast   rosuvastatin  20 mg Oral QHS   sodium chloride flush  3 mL Intravenous Q12H   Continuous Infusions:  Nutritional status Signs/Symptoms: estimated needs Interventions: Refer to RD note for recommendations Body mass index is 31.67 kg/m.  Data Reviewed:   CBC: Recent Labs  Lab 07/07/22 1212 07/07/22 1431 07/08/22 0103 07/09/22 0106  WBC 7.4  --  5.6 14.5*  NEUTROABS 5.3  --   --   --   HGB 14.5 15.0 13.6 13.4  HCT 45.2 44.0 41.1 40.5  MCV 96.2  --  91.5 93.8  PLT 200  --  208 220   Basic Metabolic Panel: Recent Labs  Lab 07/07/22 1212 07/07/22 1431 07/08/22 0103 07/09/22 0106  NA 137 138 138 139  K 4.1 3.9 4.2 3.9  CL 103 104 100 103  CO2 20*  --  27 26  GLUCOSE 101* 96 147* 126*  BUN 11 12 11 23   CREATININE 1.50* 1.40* 1.59* 1.76*  CALCIUM 8.2*  --  8.3* 8.4*  MG  --   --   --  2.5*  PHOS  --   --   --  4.3   GFR: Estimated Creatinine Clearance: 47.7 mL/min (A) (by C-G formula based on SCr of 1.76 mg/dL (H)). Liver Function Tests: No results for input(s): "AST", "ALT", "ALKPHOS", "BILITOT", "PROT", "ALBUMIN" in the last 168 hours. No results for input(s): "LIPASE", "AMYLASE" in the last 168 hours. No results for input(s): "AMMONIA" in the last 168 hours. Coagulation  Profile: No results for input(s): "INR", "PROTIME" in the last 168 hours. Cardiac Enzymes: No results for input(s): "CKTOTAL", "CKMB", "CKMBINDEX", "TROPONINI" in the last 168 hours. BNP (last 3 results) No results for input(s): "PROBNP" in the last 8760 hours. HbA1C: No results for input(s): "HGBA1C" in the last 72 hours. CBG: No results for input(s): "GLUCAP" in the last 168 hours. Lipid Profile: No results for input(s): "CHOL", "HDL", "LDLCALC", "TRIG", "CHOLHDL", "LDLDIRECT" in the last 72 hours. Thyroid Function Tests: No results for input(s): "TSH", "T4TOTAL", "FREET4", "  T3FREE", "THYROIDAB" in the last 72 hours. Anemia Panel: No results for input(s): "VITAMINB12", "FOLATE", "FERRITIN", "TIBC", "IRON", "RETICCTPCT" in the last 72 hours. Sepsis Labs: No results for input(s): "PROCALCITON", "LATICACIDVEN" in the last 168 hours.  Recent Results (from the past 240 hour(s))  SARS Coronavirus 2 by RT PCR (hospital order, performed in [facility] hospital lab) *cepheid single result test* Anterior Nasal Swab     Status: None   Collection Time: 07/08/22  8:23 AM   Specimen: Anterior Nasal Swab  Result Value Ref Range Status   SARS Coronavirus 2 by RT PCR NEGATIVE NEGATIVE Final    Comment: Performed at [facility], [address], [place], [place] [address]  Respiratory (~20 pathogens) panel by PCR     Status: None   Collection Time: 07/08/22  8:23 AM   Specimen: Nasopharyngeal Swab; Respiratory  Result Value Ref Range Status   Adenovirus NOT DETECTED NOT DETECTED Final   Coronavirus 229E NOT DETECTED NOT DETECTED Final    Comment: (NOTE) The Coronavirus on the Respiratory Panel, DOES NOT test for the novel  Coronavirus (2019 nCoV)    Coronavirus HKU1 NOT DETECTED NOT DETECTED Final   Coronavirus NL63 NOT DETECTED NOT DETECTED Final   Coronavirus OC43 NOT DETECTED NOT DETECTED Final   Metapneumovirus NOT DETECTED NOT DETECTED Final   Rhinovirus / Enterovirus NOT DETECTED  NOT DETECTED Final   Influenza A NOT DETECTED NOT DETECTED Final   Influenza B NOT DETECTED NOT DETECTED Final   Parainfluenza Virus 1 NOT DETECTED NOT DETECTED Final   Parainfluenza Virus 2 NOT DETECTED NOT DETECTED Final   Parainfluenza Virus 3 NOT DETECTED NOT DETECTED Final   Parainfluenza Virus 4 NOT DETECTED NOT DETECTED Final   Respiratory Syncytial Virus NOT DETECTED NOT DETECTED Final   Bordetella pertussis NOT DETECTED NOT DETECTED Final   Bordetella Parapertussis NOT DETECTED NOT DETECTED Final   Chlamydophila pneumoniae NOT DETECTED NOT DETECTED Final   Mycoplasma pneumoniae NOT DETECTED NOT DETECTED Final    Comment: Performed at [facility], [address] [place], [place] [address]         Radiology Studies: ECHOCARDIOGRAM COMPLETE  Result Date: 07/08/2022    ECHOCARDIOGRAM REPORT   Patient Name:   LOYAL HOLZHEIMER Date of Exam: 07/08/2022 Medical Rec #:  [id number]      Height:       71.0 in Accession #:    [id number]     Weight:       223.1 lb Date of Birth:  December 24, 1950      BSA:          2.209 m Patient Age:    72 years       BP:           113/72 mmHg Patient Gender: M              HR:           64 bpm. Exam Location:  Inpatient Procedure: 2D Echo, Cardiac Doppler and Color Doppler Indications:    CHF-Acute Diastolic 428.31 / I50.31  History:        Patient has prior history of Echocardiogram examinations, most                 recent 06/24/2021. CHF, CAD and Previous Myocardial Infarction,                 COPD; Risk Factors:Hypertension, Dyslipidemia, Sleep Apnea and  Former Smoker.  Sonographer:    Aron Baba Referring Phys: [id number] JENNIFER YATES  Sonographer Comments: Technically difficult study due to poor echo windows. IMPRESSIONS  1. Left ventricular ejection fraction, by estimation, is 30 to 35%. The left ventricle has moderately decreased function. The left ventricle demonstrates global hypokinesis. The left ventricular internal cavity size was mildly  dilated. Left ventricular diastolic function could not be evaluated.  2. Right ventricular systolic function is moderately reduced. The right ventricular size is moderately enlarged. There is normal pulmonary artery systolic pressure. The estimated right ventricular systolic pressure is 28.1 mmHg.  3. Left atrial size was mildly dilated.  4. The mitral valve is grossly normal. Mild mitral valve regurgitation. No evidence of mitral stenosis.  5. The aortic valve was not well visualized. Aortic valve regurgitation is not visualized. No aortic stenosis is present.  6. The inferior vena cava is dilated in size with >50% respiratory variability, suggesting right atrial pressure of 8 mmHg. Comparison(s): Changes from prior study are noted. The left ventricular function is worsened. FINDINGS  Left Ventricle: Left ventricular ejection fraction, by estimation, is 30 to 35%. The left ventricle has moderately decreased function. The left ventricle demonstrates global hypokinesis. Definity contrast agent was given IV to delineate the left ventricular endocardial borders. The left ventricular internal cavity size was mildly dilated. There is no left ventricular hypertrophy. Left ventricular diastolic function could not be evaluated due to atrial fibrillation. Left ventricular diastolic function could not be evaluated. Right Ventricle: The right ventricular size is moderately enlarged. No increase in right ventricular wall thickness. Right ventricular systolic function is moderately reduced. There is normal pulmonary artery systolic pressure. The tricuspid regurgitant velocity is 2.24 m/s, and with an assumed right atrial pressure of 8 mmHg, the estimated right ventricular systolic pressure is 28.1 mmHg. Left Atrium: Left atrial size was mildly dilated. Right Atrium: Right atrial size was normal in size. Pericardium: There is no evidence of pericardial effusion. Mitral Valve: The mitral valve is grossly normal. Mild mitral valve  regurgitation. No evidence of mitral valve stenosis. Tricuspid Valve: The tricuspid valve is grossly normal. Tricuspid valve regurgitation is trivial. No evidence of tricuspid stenosis. Aortic Valve: The aortic valve was not well visualized. Aortic valve regurgitation is not visualized. No aortic stenosis is present. Pulmonic Valve: The pulmonic valve was not well visualized. Pulmonic valve regurgitation is not visualized. Aorta: The aortic root and ascending aorta are structurally normal, with no evidence of dilitation. Venous: The inferior vena cava is dilated in size with greater than 50% respiratory variability, suggesting right atrial pressure of 8 mmHg. IAS/Shunts: The atrial septum is grossly normal. Additional Comments: A device lead is visualized in the right ventricle and right atrium.  LEFT VENTRICLE PLAX 2D LVIDd:         5.90 cm   Diastology LVIDs:         5.20 cm   LV e' medial:    6.66 cm/s LV PW:         1.20 cm   LV E/e' medial:  17.1 LV IVS:        0.90 cm   LV e' lateral:   6.46 cm/s LVOT diam:     1.90 cm   LV E/e' lateral: 17.6 LV SV:         37 LV SV Index:   17 LVOT Area:     2.84 cm  RIGHT VENTRICLE RV S prime:     6.64 cm/s TAPSE (M-mode): 1.2 cm  LEFT ATRIUM             Index        RIGHT ATRIUM           Index LA diam:        5.10 cm 2.31 cm/m   RA Area:     34.00 cm LA Vol (A2C):   80.8 ml 36.58 ml/m  RA Volume:   133.00 ml 60.21 ml/m LA Vol (A4C):   73.4 ml 33.23 ml/m LA Biplane Vol: 78.7 ml 35.63 ml/m  AORTIC VALVE LVOT Vmax:   64.60 cm/s LVOT Vmean:  40.200 cm/s LVOT VTI:    0.129 m  AORTA Ao Root diam: 4.00 cm Ao Asc diam:  3.40 cm MITRAL VALVE                 TRICUSPID VALVE MV Area (PHT): 4.49 cm      TR Peak grad:   20.1 mmHg MV Decel Time: 169 msec      TR Vmax:        224.00 cm/s MR Peak grad:   76.4 mmHg MR Mean grad:   48.0 mmHg    SHUNTS MR Vmax:        437.00 cm/s  Systemic VTI:  0.13 m MR Vmean:       327.0 cm/s   Systemic Diam: 1.90 cm MR PISA:        0.57 cm MR  PISA Radius: 0.30 cm MV E velocity: 114.00 cm/s Lennie Odor MD Electronically signed by Lennie Odor MD Signature Date/Time: 07/08/2022/3:15:54 PM    Final    DG Chest Port 1 View  Result Date: 07/07/2022 CLINICAL DATA:  Shortness of breath x2 days EXAM: PORTABLE CHEST 1 VIEW COMPARISON:  Previous studies including the examination done on 04/25/2021 FINDINGS: Transverse diameter of heart is increased. Central pulmonary vessels are prominent. Increased interstitial markings are seen in the parahilar regions and lower lung fields. There is blunting of left lateral CP angle. Extensive pleural calcifications are seen in the periphery of right lung. Blunting of right lateral CP angle has not changed. There is no pneumothorax. Pacemaker/defibrillator battery is seen in left infraclavicular region. There is a metallic clamp in the region of left atrial appendage. Metallic sutures are seen in the sternum. IMPRESSION: Cardiomegaly. Central pulmonary vessels are prominent. Increased interstitial markings are seen in the parahilar regions and lower lung fields suggesting interstitial pneumonia or pulmonary edema. There is new small left pleural effusion. Pleural calcifications in the periphery of right lung and blunting of right lateral CP angle have not changed suggesting scarring. Electronically Signed   By: Ernie Avena M.D.   On: 07/07/2022 13:37           LOS: 2 days   Time spent= 35 mins    Varvara Legault Joline Maxcy, MD [facility]  If 7PM-7AM, please contact night-coverage  07/09/2022, 7:43 AM

## 2022-07-09 NOTE — Progress Notes (Signed)
   Heart Failure Stewardship Pharmacist Progress Note   PCP: [facility] PCP-Cardiologist: None    HPI:  72 yo M with PMH of CHF, COPD - end stage, GERD, HLD, prior PE, CAD s/p CABG, afib, and chronic LE edema.   Presented to the ED on 6/25 with increased shortness of breath, orthopnea, weight gain, and hypoxia. CXR with probable edema. BNP elevated. ECHO 6/26 showed LVEF 30-35% (was 20-25% in 2018, 30-35% in 2021 and 2022, 40-45% in 2023), global hypokinesis, RV moderately reduced.   Current HF Medications: Diuretic: furosemide 40 mg IV BID  Prior to admission HF Medications: Diuretic: furosemide 40 mg BID Beta blocker: metoprolol XL 25 mg daily  Pertinent Lab Values: Serum creatinine 1.76, BUN 23, Potassium 3.9, Sodium 139, BNP 497.6, Magnesium 2.5  Vital Signs: Weight: 227 lbs (admission weight: 223 lbs) Blood pressure: 100/60  Heart rate: 60-70s  I/O: -0.9L yesterday; net -1.4L  Medication Assistance / Insurance Benefits Check: Does the patient have prescription insurance?  Yes Type of insurance plan: [employer]  Outpatient Pharmacy:  Prior to admission outpatient pharmacy: [facility] Is the patient willing to use [facility] TOC pharmacy at discharge? Yes Is the patient willing to transition their outpatient pharmacy to utilize a [facility] outpatient pharmacy?   No    Assessment: 1. Acute on chronic systolic CHF (LVEF 30-35%), due to ICM. NYHA class III symptoms. - Continue furosemide 40 mg IV BID. Strict I/Os and daily weights. Keep K>4 and Mg>2. - Holding BB with hypotension. Rates 60-70s. - Consider starting Jardiance 10 mg daily prior to discharge when renal function improved. Creatinine bumped from 1.5>1.76 on IV lasix   Plan: 1) Medication changes recommended at this time: - Continue IV diuresis  2) Patient assistance: - Has insurance through the [place] - cover both Entresto and Jardiance  3)  Education  - Patient has been educated on current HF  medications and potential additions to HF medication regimen - Patient verbalizes understanding that over the next few months, these medication doses may change and more medications may be added to optimize HF regimen - Patient has been educated on basic disease state pathophysiology and goals of therapy   Sharen Hones, PharmD, BCPS Heart Failure Engineer, building services Phone [phone]

## 2022-07-10 ENCOUNTER — Inpatient Hospital Stay (HOSPITAL_COMMUNITY): Payer: Medicare Other

## 2022-07-10 DIAGNOSIS — I509 Heart failure, unspecified: Secondary | ICD-10-CM | POA: Diagnosis not present

## 2022-07-10 DIAGNOSIS — R0602 Shortness of breath: Secondary | ICD-10-CM

## 2022-07-10 LAB — MAGNESIUM: Magnesium: 2.5 mg/dL — ABNORMAL HIGH (ref 1.7–2.4)

## 2022-07-10 LAB — CBC
HCT: 41.1 % (ref 39.0–52.0)
Hemoglobin: 13.3 g/dL (ref 13.0–17.0)
MCH: 30.2 pg (ref 26.0–34.0)
MCHC: 32.4 g/dL (ref 30.0–36.0)
MCV: 93.2 fL (ref 80.0–100.0)
Platelets: 209 10*3/uL (ref 150–400)
RBC: 4.41 MIL/uL (ref 4.22–5.81)
RDW: 14.9 % (ref 11.5–15.5)
WBC: 9.7 10*3/uL (ref 4.0–10.5)
nRBC: 0 % (ref 0.0–0.2)

## 2022-07-10 LAB — BASIC METABOLIC PANEL
Anion gap: 9 (ref 5–15)
BUN: 25 mg/dL — ABNORMAL HIGH (ref 8–23)
CO2: 28 mmol/L (ref 22–32)
Calcium: 8.4 mg/dL — ABNORMAL LOW (ref 8.9–10.3)
Chloride: 99 mmol/L (ref 98–111)
Creatinine, Ser: 1.65 mg/dL — ABNORMAL HIGH (ref 0.61–1.24)
GFR, Estimated: 44 mL/min — ABNORMAL LOW (ref >60)
Glucose, Bld: 134 mg/dL — ABNORMAL HIGH (ref 70–99)
Potassium: 4 mmol/L (ref 3.5–5.1)
Sodium: 136 mmol/L (ref 135–145)

## 2022-07-10 MED ORDER — FUROSEMIDE 40 MG PO TABS
60.0000 mg | ORAL_TABLET | Freq: Two times a day (BID) | ORAL | Status: DC
  Administered 2022-07-10 – 2022-07-11 (×2): 60 mg via ORAL
  Filled 2022-07-10 (×2): qty 1

## 2022-07-10 MED ORDER — EMPAGLIFLOZIN 10 MG PO TABS
10.0000 mg | ORAL_TABLET | Freq: Every day | ORAL | Status: DC
  Administered 2022-07-10 – 2022-07-11 (×2): 10 mg via ORAL
  Filled 2022-07-10 (×2): qty 1

## 2022-07-10 NOTE — Progress Notes (Signed)
   Heart Failure Stewardship Pharmacist Progress Note   PCP: [facility] PCP-Cardiologist: None    HPI:  72 yo M with PMH of CHF, COPD - end stage, GERD, HLD, prior PE, CAD s/p CABG, afib, and chronic LE edema.   Presented to the ED on 6/25 with increased shortness of breath, orthopnea, weight gain, and hypoxia. CXR with probable edema. BNP elevated. ECHO 6/26 showed LVEF 30-35% (was 20-25% in 2018, 30-35% in 2021 and 2022, 40-45% in 2023), global hypokinesis, RV moderately reduced.   CT scan today for possible PNA.  Current HF Medications: Diuretic: furosemide 40 mg IV BID  Prior to admission HF Medications: Diuretic: furosemide 40 mg BID Beta blocker: metoprolol XL 25 mg daily  Pertinent Lab Values: Serum creatinine 1.65, BUN 25, Potassium 4.0, Sodium 136, BNP 497.6, Magnesium 2.5  Vital Signs: Weight: 223 lbs (admission weight: 223 lbs) Blood pressure: 110/60  Heart rate: 60-70s  I/O: -1.1L yesterday; net -2.7L  Medication Assistance / Insurance Benefits Check: Does the patient have prescription insurance?  Yes Type of insurance plan: [employer]  Outpatient Pharmacy:  Prior to admission outpatient pharmacy: [facility] Is the patient willing to use [facility] TOC pharmacy at discharge? Yes Is the patient willing to transition their outpatient pharmacy to utilize a [facility] outpatient pharmacy?   No    Assessment: 1. Acute on chronic systolic CHF (LVEF 30-35%), due to ICM. NYHA class II symptoms. - On furosemide 40 mg IV BID, O2 weaned down from 10L>6L today. Weight down 4 lbs from yesterday. May be able to transition to oral tonight or tomorrow. Strict I/Os and daily weights. Keep K>4 and Mg>2. - Have been holding BB with hypotension. BP starting to improve. Consider restarting metoprolol XL 25 mg daily. - Consider starting Jardiance 10 mg daily with renal function improving. Baseline creatinine ~1.5.   Plan: 1) Medication changes recommended at this time: -  Add Jardiance 10 mg daily - Restart metoprolol XL 25 mg daily - Transition IV to PO lasix   2) Patient assistance: - Has insurance through the [place] - cover both Entresto and Jardiance  3)  Education  - Patient has been educated on current HF medications and potential additions to HF medication regimen - Patient verbalizes understanding that over the next few months, these medication doses may change and more medications may be added to optimize HF regimen - Patient has been educated on basic disease state pathophysiology and goals of therapy   Sharen Hones, PharmD, BCPS Heart Failure Engineer, building services Phone [phone]

## 2022-07-10 NOTE — Progress Notes (Signed)
Mobility Specialist Progress Note:   07/10/22 1426  Mobility  Activity Ambulated independently in hallway  Level of Assistance Independent  Assistive Device None  Distance Ambulated (ft) 100 ft  Activity Response Tolerated well  Mobility Referral Yes  $Mobility charge 1 Mobility  Mobility Specialist Start Time (ACUTE ONLY) 1350  Mobility Specialist Stop Time (ACUTE ONLY) 1415  Mobility Specialist Time Calculation (min) (ACUTE ONLY) 25 min    Pre Mobility: 77% SpO2 6 L During Mobility: 119 HR , 77% SpO2 10 L  Post Mobility: 71 HR , 90% SpO2 8 L   Pt received in bed agreeable to mobility, on 6LO2, however SpO2 77%. Required incr to 10L to recover to 89%. Pt desat to 77% on 10 L during ambulation requiring ~63min rest break to recover [facility]. Pt returned to bed with call bell in hand with all needs met, on 8LO2 SpO2 92%. RN aware.  Leory Plowman  Mobility Specialist Please contact via [employer] office at [phone]

## 2022-07-10 NOTE — Progress Notes (Signed)
PROGRESS NOTE    Max Rivas  WUJ:[id number] DOB: 1950-08-19 DOA: 07/07/2022 PCP: [facility]   Brief Narrative:  72 year old with history of CAD status post CABG, COPD on 4 L nasal cannula, chronic systolic CHF, A-fib on Eliquis, HLD, OSA comes to the hospital with shortness of breath ongoing for 2 days.  Upon admission chest x-ray showed pulmonary edema and had productive cough.  COVID/respiratory viral panel were negative.  Upon admission started on bronchodilators, steroids and diuretics.  Not much better requiring 9L when up walking.     Assessment & Plan:  Active Problems:   Hypercholesterolemia   Coronary artery disease   COPD (chronic obstructive pulmonary disease) (HCC)   Hypothyroidism   OSA (obstructive sleep apnea)   Chronic HFrEF (heart failure with reduced ejection fraction) (HCC)   Acute on chronic respiratory failure with hypoxia (HCC)   Essential hypertension   DNR (do not resuscitate)   PAF (paroxysmal atrial fibrillation) (HCC)     Acute on chronic respiratory failure with hypoxia, 4 L at home - On 8 L high flow Combination of infectious versus COPD versus CHF exacerbation -will get CT scan of [place] - home 4 L nasal cannula.  Adjust as appropriate Bronchodilators scheduled and as needed I-S/flutter valve    Acute on chronic systolic CHF, EF 95%.  Class IV Echocardiogram in June 2023 showed EF of 45%, grade 3 DD/restrictive cardiomyopathy, dilated cardiomyopathy - Although renal function slowly trending up, continue IV diuretics given his significant oxygen requirement. -consider change to PO once CT scan done   COPD, possible exacerbation Leukocytosis, secondary to steroids Patient has known end-stage COPD.  Continue scheduled and as needed bronchodilators.  I-S, flutter valve.  Continue steroids   HTN -monitor   HLD -Continue Crestor   Paroxysmal atrial fibrillation Continue amiodarone, Toprol-XL and Eliquis   CAD status post  CABG -Continue home medications aspirin and statin   OSA -Not on CPAP   Mood d/o -Continue amitriptyline   Hypothyroidism Synthroid    DVT prophylaxis: Eliquis Code Status: DNR Family Communication:  none at beside Status is: Inpatient  Await CT scan         Subjective: Still very SOB-- says he was able to grocery shop last Friday on his 4 with no issues  Examination:  General: Appearance:    Obese male in no acute distress     Lungs:     respirations unlabored, on HFNC, diminished, no wheezing  Heart:    Normal heart rate.   MS:   All extremities are intact.   Neurologic:   Awake, alert, oriented x 3. No apparent focal neurological           defect.      Objective: Vitals:   07/10/22 0810 07/10/22 0815 07/10/22 1049 07/10/22 1125  BP:    128/73  Pulse:    69  Resp:    16  Temp:    97.6 F (36.4 C)  TempSrc:    Oral  SpO2: 100% 100% 97% 90%  Weight:      Height:        Intake/Output Summary (Last 24 hours) at 07/10/2022 1208 Last data filed at 07/10/2022 1115 Gross per 24 hour  Intake 480 ml  Output 2450 ml  Net -1970 ml   Filed Weights   07/08/22 0413 07/09/22 0454 07/10/22 0300  Weight: 101.2 kg 103 kg 101.2 kg    Scheduled Meds:  amiodarone  200 mg Oral Daily  amitriptyline  25 mg Oral QHS   apixaban  5 mg Oral BID   aspirin EC  81 mg Oral Daily   budesonide  0.25 mg Nebulization BID   cyclobenzaprine  10 mg Oral QPC breakfast   docusate sodium  100 mg Oral BID   furosemide  40 mg Intravenous BID   gabapentin  200 mg Oral BID   ipratropium  0.5 mg Nebulization BID   levalbuterol  0.63 mg Nebulization BID   levothyroxine  100 mcg Oral Q0600   magnesium oxide  400 mg Oral Daily   montelukast  10 mg Oral QHS   multivitamin with minerals  1 tablet Oral Daily   pantoprazole  40 mg Oral Daily   polyvinyl alcohol  1 drop Both Eyes QID   rosuvastatin  20 mg Oral QHS   sodium chloride flush  3 mL Intravenous Q12H   Continuous  Infusions:  Nutritional status Signs/Symptoms: estimated needs Interventions: Refer to RD note for recommendations Body mass index is 31.12 kg/m.  Data Reviewed:   CBC: Recent Labs  Lab 07/07/22 1212 07/07/22 1431 07/08/22 0103 07/09/22 0106 07/10/22 0103  WBC 7.4  --  5.6 14.5* 9.7  NEUTROABS 5.3  --   --   --   --   HGB 14.5 15.0 13.6 13.4 13.3  HCT 45.2 44.0 41.1 40.5 41.1  MCV 96.2  --  91.5 93.8 93.2  PLT 200  --  208 220 209   Basic Metabolic Panel: Recent Labs  Lab 07/07/22 1212 07/07/22 1431 07/08/22 0103 07/09/22 0106 07/10/22 0103  NA 137 138 138 139 136  K 4.1 3.9 4.2 3.9 4.0  CL 103 104 100 103 99  CO2 20*  --  27 26 28   GLUCOSE 101* 96 147* 126* 134*  BUN 11 12 11 23  25*  CREATININE 1.50* 1.40* 1.59* 1.76* 1.65*  CALCIUM 8.2*  --  8.3* 8.4* 8.4*  MG  --   --   --  2.5* 2.5*  PHOS  --   --   --  4.3  --    GFR: Estimated Creatinine Clearance: 50.5 mL/min (A) (by C-G formula based on SCr of 1.65 mg/dL (H)). Liver Function Tests: No results for input(s): "AST", "ALT", "ALKPHOS", "BILITOT", "PROT", "ALBUMIN" in the last 168 hours. No results for input(s): "LIPASE", "AMYLASE" in the last 168 hours. No results for input(s): "AMMONIA" in the last 168 hours. Coagulation Profile: No results for input(s): "INR", "PROTIME" in the last 168 hours. Cardiac Enzymes: No results for input(s): "CKTOTAL", "CKMB", "CKMBINDEX", "TROPONINI" in the last 168 hours. BNP (last 3 results) No results for input(s): "PROBNP" in the last 8760 hours. HbA1C: No results for input(s): "HGBA1C" in the last 72 hours. CBG: No results for input(s): "GLUCAP" in the last 168 hours. Lipid Profile: No results for input(s): "CHOL", "HDL", "LDLCALC", "TRIG", "CHOLHDL", "LDLDIRECT" in the last 72 hours. Thyroid Function Tests: No results for input(s): "TSH", "T4TOTAL", "FREET4", "T3FREE", "THYROIDAB" in the last 72 hours. Anemia Panel: No results for input(s): "VITAMINB12", "FOLATE",  "FERRITIN", "TIBC", "IRON", "RETICCTPCT" in the last 72 hours. Sepsis Labs: No results for input(s): "PROCALCITON", "LATICACIDVEN" in the last 168 hours.  Recent Results (from the past 240 hour(s))  SARS Coronavirus 2 by RT PCR (hospital order, performed in [facility] hospital lab) *cepheid single result test* Anterior Nasal Swab     Status: None   Collection Time: 07/08/22  8:23 AM   Specimen: Anterior Nasal Swab  Result Value Ref  Range Status   SARS Coronavirus 2 by RT PCR NEGATIVE NEGATIVE Final    Comment: Performed at [facility], [address] [place], [place] [address]  Respiratory (~20 pathogens) panel by PCR     Status: None   Collection Time: 07/08/22  8:23 AM   Specimen: Nasopharyngeal Swab; Respiratory  Result Value Ref Range Status   Adenovirus NOT DETECTED NOT DETECTED Final   Coronavirus 229E NOT DETECTED NOT DETECTED Final    Comment: (NOTE) The Coronavirus on the Respiratory Panel, DOES NOT test for the novel  Coronavirus (2019 nCoV)    Coronavirus HKU1 NOT DETECTED NOT DETECTED Final   Coronavirus NL63 NOT DETECTED NOT DETECTED Final   Coronavirus OC43 NOT DETECTED NOT DETECTED Final   Metapneumovirus NOT DETECTED NOT DETECTED Final   Rhinovirus / Enterovirus NOT DETECTED NOT DETECTED Final   Influenza A NOT DETECTED NOT DETECTED Final   Influenza B NOT DETECTED NOT DETECTED Final   Parainfluenza Virus 1 NOT DETECTED NOT DETECTED Final   Parainfluenza Virus 2 NOT DETECTED NOT DETECTED Final   Parainfluenza Virus 3 NOT DETECTED NOT DETECTED Final   Parainfluenza Virus 4 NOT DETECTED NOT DETECTED Final   Respiratory Syncytial Virus NOT DETECTED NOT DETECTED Final   Bordetella pertussis NOT DETECTED NOT DETECTED Final   Bordetella Parapertussis NOT DETECTED NOT DETECTED Final   Chlamydophila pneumoniae NOT DETECTED NOT DETECTED Final   Mycoplasma pneumoniae NOT DETECTED NOT DETECTED Final    Comment: Performed at [facility], [address],  [place], [place] [address]         Radiology Studies: ECHOCARDIOGRAM COMPLETE  Result Date: 07/08/2022    ECHOCARDIOGRAM REPORT   Patient Name:   Max Rivas Date of Exam: 07/08/2022 Medical Rec #:  [id number]      Height:       71.0 in Accession #:    [id number]     Weight:       223.1 lb Date of Birth:  1950/05/15      BSA:          2.209 m Patient Age:    72 years       BP:           113/72 mmHg Patient Gender: M              HR:           64 bpm. Exam Location:  Inpatient Procedure: 2D Echo, Cardiac Doppler and Color Doppler Indications:    CHF-Acute Diastolic 428.31 / I50.31  History:        Patient has prior history of Echocardiogram examinations, most                 recent 06/24/2021. CHF, CAD and Previous Myocardial Infarction,                 COPD; Risk Factors:Hypertension, Dyslipidemia, Sleep Apnea and                 Former Smoker.  Sonographer:    Aron Baba Referring Phys: [id number] JENNIFER YATES  Sonographer Comments: Technically difficult study due to poor echo windows. IMPRESSIONS  1. Left ventricular ejection fraction, by estimation, is 30 to 35%. The left ventricle has moderately decreased function. The left ventricle demonstrates global hypokinesis. The left ventricular internal cavity size was mildly dilated. Left ventricular diastolic function could not be evaluated.  2. Right ventricular systolic function is moderately reduced. The right ventricular size  is moderately enlarged. There is normal pulmonary artery systolic pressure. The estimated right ventricular systolic pressure is 28.1 mmHg.  3. Left atrial size was mildly dilated.  4. The mitral valve is grossly normal. Mild mitral valve regurgitation. No evidence of mitral stenosis.  5. The aortic valve was not well visualized. Aortic valve regurgitation is not visualized. No aortic stenosis is present.  6. The inferior vena cava is dilated in size with >50% respiratory variability, suggesting right atrial pressure of 8 mmHg.  Comparison(s): Changes from prior study are noted. The left ventricular function is worsened. FINDINGS  Left Ventricle: Left ventricular ejection fraction, by estimation, is 30 to 35%. The left ventricle has moderately decreased function. The left ventricle demonstrates global hypokinesis. Definity contrast agent was given IV to delineate the left ventricular endocardial borders. The left ventricular internal cavity size was mildly dilated. There is no left ventricular hypertrophy. Left ventricular diastolic function could not be evaluated due to atrial fibrillation. Left ventricular diastolic function could not be evaluated. Right Ventricle: The right ventricular size is moderately enlarged. No increase in right ventricular wall thickness. Right ventricular systolic function is moderately reduced. There is normal pulmonary artery systolic pressure. The tricuspid regurgitant velocity is 2.24 m/s, and with an assumed right atrial pressure of 8 mmHg, the estimated right ventricular systolic pressure is 28.1 mmHg. Left Atrium: Left atrial size was mildly dilated. Right Atrium: Right atrial size was normal in size. Pericardium: There is no evidence of pericardial effusion. Mitral Valve: The mitral valve is grossly normal. Mild mitral valve regurgitation. No evidence of mitral valve stenosis. Tricuspid Valve: The tricuspid valve is grossly normal. Tricuspid valve regurgitation is trivial. No evidence of tricuspid stenosis. Aortic Valve: The aortic valve was not well visualized. Aortic valve regurgitation is not visualized. No aortic stenosis is present. Pulmonic Valve: The pulmonic valve was not well visualized. Pulmonic valve regurgitation is not visualized. Aorta: The aortic root and ascending aorta are structurally normal, with no evidence of dilitation. Venous: The inferior vena cava is dilated in size with greater than 50% respiratory variability, suggesting right atrial pressure of 8 mmHg. IAS/Shunts: The atrial  septum is grossly normal. Additional Comments: A device lead is visualized in the right ventricle and right atrium.  LEFT VENTRICLE PLAX 2D LVIDd:         5.90 cm   Diastology LVIDs:         5.20 cm   LV e' medial:    6.66 cm/s LV PW:         1.20 cm   LV E/e' medial:  17.1 LV IVS:        0.90 cm   LV e' lateral:   6.46 cm/s LVOT diam:     1.90 cm   LV E/e' lateral: 17.6 LV SV:         37 LV SV Index:   17 LVOT Area:     2.84 cm  RIGHT VENTRICLE RV S prime:     6.64 cm/s TAPSE (M-mode): 1.2 cm LEFT ATRIUM             Index        RIGHT ATRIUM           Index LA diam:        5.10 cm 2.31 cm/m   RA Area:     34.00 cm LA Vol (A2C):   80.8 ml 36.58 ml/m  RA Volume:   133.00 ml 60.21 ml/m LA Vol (A4C):  73.4 ml 33.23 ml/m LA Biplane Vol: 78.7 ml 35.63 ml/m  AORTIC VALVE LVOT Vmax:   64.60 cm/s LVOT Vmean:  40.200 cm/s LVOT VTI:    0.129 m  AORTA Ao Root diam: 4.00 cm Ao Asc diam:  3.40 cm MITRAL VALVE                 TRICUSPID VALVE MV Area (PHT): 4.49 cm      TR Peak grad:   20.1 mmHg MV Decel Time: 169 msec      TR Vmax:        224.00 cm/s MR Peak grad:   76.4 mmHg MR Mean grad:   48.0 mmHg    SHUNTS MR Vmax:        437.00 cm/s  Systemic VTI:  0.13 m MR Vmean:       327.0 cm/s   Systemic Diam: 1.90 cm MR PISA:        0.57 cm MR PISA Radius: 0.30 cm MV E velocity: 114.00 cm/s Lennie Odor MD Electronically signed by Lennie Odor MD Signature Date/Time: 07/08/2022/3:15:54 PM    Final            LOS: 3 days   Time spent= 35 mins    Joseph Art, DO [facility]  If 7PM-7AM, please contact night-coverage  07/10/2022, 12:08 PM

## 2022-07-10 NOTE — Care Management Important Message (Signed)
Important Message  Patient Details  Name: Max Rivas MRN: [id number] Date of Birth: 12-Aug-1950   Medicare Important Message Given:  Yes     Renie Ora 07/10/2022, 8:38 AM

## 2022-07-11 DIAGNOSIS — J438 Other emphysema: Secondary | ICD-10-CM

## 2022-07-11 DIAGNOSIS — I5022 Chronic systolic (congestive) heart failure: Secondary | ICD-10-CM

## 2022-07-11 DIAGNOSIS — G4733 Obstructive sleep apnea (adult) (pediatric): Secondary | ICD-10-CM

## 2022-07-11 DIAGNOSIS — J9621 Acute and chronic respiratory failure with hypoxia: Secondary | ICD-10-CM | POA: Diagnosis not present

## 2022-07-11 DIAGNOSIS — I509 Heart failure, unspecified: Secondary | ICD-10-CM | POA: Diagnosis not present

## 2022-07-11 DIAGNOSIS — E039 Hypothyroidism, unspecified: Secondary | ICD-10-CM

## 2022-07-11 LAB — BASIC METABOLIC PANEL
Anion gap: 8 (ref 5–15)
BUN: 31 mg/dL — ABNORMAL HIGH (ref 8–23)
CO2: 29 mmol/L (ref 22–32)
Calcium: 8.7 mg/dL — ABNORMAL LOW (ref 8.9–10.3)
Chloride: 100 mmol/L (ref 98–111)
Creatinine, Ser: 1.64 mg/dL — ABNORMAL HIGH (ref 0.61–1.24)
GFR, Estimated: 45 mL/min — ABNORMAL LOW (ref >60)
Glucose, Bld: 136 mg/dL — ABNORMAL HIGH (ref 70–99)
Potassium: 4.2 mmol/L (ref 3.5–5.1)
Sodium: 137 mmol/L (ref 135–145)

## 2022-07-11 LAB — CBC
HCT: 41.8 % (ref 39.0–52.0)
Hemoglobin: 13.4 g/dL (ref 13.0–17.0)
MCH: 29.9 pg (ref 26.0–34.0)
MCHC: 32.1 g/dL (ref 30.0–36.0)
MCV: 93.3 fL (ref 80.0–100.0)
Platelets: 215 10*3/uL (ref 150–400)
RBC: 4.48 MIL/uL (ref 4.22–5.81)
RDW: 14.7 % (ref 11.5–15.5)
WBC: 9.2 10*3/uL (ref 4.0–10.5)
nRBC: 0 % (ref 0.0–0.2)

## 2022-07-11 LAB — MAGNESIUM: Magnesium: 2.5 mg/dL — ABNORMAL HIGH (ref 1.7–2.4)

## 2022-07-11 LAB — CALCIUM, IONIZED: Calcium, Ionized, Serum: 4.9 mg/dL (ref 4.5–5.6)

## 2022-07-11 LAB — PROCALCITONIN: Procalcitonin: <0.1 ng/mL

## 2022-07-11 MED ORDER — HYDROCORTISONE (PERIANAL) 2.5 % EX CREA: 1.0000 | TOPICAL_CREAM | Freq: Four times a day (QID) | CUTANEOUS | 0 refills | Status: DC | PRN

## 2022-07-11 MED ORDER — FUROSEMIDE 20 MG PO TABS: 60.0000 mg | ORAL_TABLET | Freq: Two times a day (BID) | ORAL | 2 refills | Status: DC

## 2022-07-11 MED ORDER — METOPROLOL SUCCINATE ER 25 MG PO TB24: 25.0000 mg | ORAL_TABLET | Freq: Every day | ORAL | Status: DC

## 2022-07-11 MED ORDER — EMPAGLIFLOZIN 10 MG PO TABS: 10.0000 mg | ORAL_TABLET | Freq: Every day | ORAL | 1 refills | Status: DC

## 2022-07-11 NOTE — Plan of Care (Signed)
  Problem: Education: Goal: Ability to demonstrate management of disease process will improve Outcome: Progressing Goal: Ability to verbalize understanding of medication therapies will improve Outcome: Progressing   Problem: Activity: Goal: Capacity to carry out activities will improve Outcome: Progressing   Problem: Education: Goal: Knowledge of disease or condition will improve Outcome: Progressing   Problem: Activity: Goal: Ability to tolerate increased activity will improve Outcome: Progressing   Problem: Respiratory: Goal: Levels of oxygenation will improve Outcome: Progressing   Problem: Education: Goal: Knowledge of General Education information will improve Description: Including pain rating scale, medication(s)/side effects and non-pharmacologic comfort measures Outcome: Progressing   Problem: Health Behavior/Discharge Planning: Goal: Ability to manage health-related needs will improve Outcome: Progressing   Problem: Coping: Goal: Level of anxiety will decrease Outcome: Progressing

## 2022-07-11 NOTE — Progress Notes (Signed)
Mobility Specialist Progress Note:   07/11/22 0950  Mobility  Activity Ambulated with assistance in hallway  Level of Assistance Standby assist, set-up cues, supervision of patient - no hands on  Assistive Device None  Distance Ambulated (ft) 200 ft  Activity Response Tolerated fair  Mobility Referral Yes  $Mobility charge 1 Mobility  Mobility Specialist Start Time (ACUTE ONLY) 0950  Mobility Specialist Stop Time (ACUTE ONLY) 1003  Mobility Specialist Time Calculation (min) (ACUTE ONLY) 13 min   Pt eager for mobility session. Received on 5LO2, SpO2 91%. No physical assistance required. Upon ambulation, pt desat to low 80s, requiring 8LO2 to maintain SpO2 WFL. Pt then desat to low 80s again, requiring incr to 10L where SpO2 remained WFL. Titrated back down to 5LO2, at EOS in chair, SpO2 91%.   Addison Lank Mobility Specialist Please contact via SecureChat or  Rehab office at [phone]

## 2022-07-12 NOTE — Discharge Summary (Signed)
Physician Discharge Summary   Patient: Max Rivas MRN: [id number] DOB: [birth year]  Admit date:     07/07/2022  Discharge date: 07/11/2022  Discharge Physician: Kathlen Mody   PCP: [facility]   Recommendations at discharge:  Please follow up with cbc and bmp in one week.  Please follow up with cardiology as recommended.   Discharge Diagnoses: Active Problems:   Hypercholesterolemia   Coronary artery disease   COPD (chronic obstructive pulmonary disease) (HCC)   Hypothyroidism   OSA (obstructive sleep apnea)   Chronic HFrEF (heart failure with reduced ejection fraction) (HCC)   Acute on chronic respiratory failure with hypoxia (HCC)   Essential hypertension   DNR (do not resuscitate)   PAF (paroxysmal atrial fibrillation) [facility])    Hospital Course:  72 year old with history of CAD status post CABG, COPD on 4 L nasal cannula, chronic systolic CHF, A-fib on Eliquis, HLD, OSA comes to the hospital with shortness of breath ongoing for 2 days. Upon admission chest x-ray showed pulmonary edema and had productive cough. COVID/respiratory viral panel were negative. Upon admission started on bronchodilators, steroids and diuretics.  Assessment and Plan:    Acute on chronic respiratory failure with hypoxia, 4 L at home Combination of infectious versus COPD versus CHF exacerbation Completed prednisone.  - home 4 L nasal cannula.  Adjust as appropriate, currently on 5 lit of [place] Oxygen. Patient requesting to be discharged.  Bronchodilators scheduled and as needed I-S/flutter valve     Acute on chronic systolic CHF, EF 40%.  Class IV Echocardiogram in June 2023 showed EF of 45%, grade 3 DD/restrictive cardiomyopathy, dilated cardiomyopathy Diuresed well, oxygen requirement is back to 5 lit of [place] oxygen.  Patient will be discharged on lasix 60 mg BID.  Recommend outpatient follow up with cardiology in one week.  Creatinine stable on lasix 60 mg BID.    COPD, possible  exacerbation Leukocytosis, secondary to steroids Patient has known end-stage COPD.  Continue scheduled and as needed bronchodilators.  I-S, flutter valve.  Continue steroids   HTN Well controlled.    HLD -Continue Crestor   Paroxysmal atrial fibrillation Continue amiodarone, Toprol-XL and Eliquis   CAD status post CABG -Continue home medications aspirin and statin   OSA -Not on CPAP   Mood d/o -Continue amitriptyline   Hypothyroidism Synthroid   Consultants: none.  Procedures performed: CT chest   Disposition: Home Diet recommendation:  Discharge Diet Orders (From admission, onward)     Start     Ordered   07/11/22 0000  Diet - low sodium heart healthy        07/11/22 1044           Cardiac diet DISCHARGE MEDICATION: Allergies as of 07/11/2022       Reactions   Crestor [rosuvastatin Calcium] Other (See Comments)   Back spasms, initially, but can tolerate it at a low dose now, in 2022 Tolerating 20mg  QD   Lipitor [atorvastatin] Other (See Comments)   Spasticity   Ventolin [albuterol] Palpitations, Other (See Comments)   Irregular heartbeat        Medication List     TAKE these medications    acetaminophen 500 MG tablet Commonly known as: TYLENOL Take 1,000 mg by mouth every 8 (eight) hours as needed for moderate pain, headache or mild pain.   amiodarone 200 MG tablet Commonly known as: PACERONE Take 1 tablet (200 mg total) by mouth 2 (two) times daily for 7 days, THEN 1 tablet (200 mg  total) daily. Start taking on: June 25, 2021 What changed: See the new instructions.   amitriptyline 25 MG tablet Commonly known as: ELAVIL Take 25 mg by mouth at bedtime.   apixaban 5 MG Tabs tablet Commonly known as: ELIQUIS Take 5 mg by mouth 2 (two) times daily.   aspirin EC 81 MG tablet Take 81 mg by mouth daily.   carboxymethylcellulose 1 % ophthalmic solution Place 1 drop into both eyes 4 (four) times daily.   cyclobenzaprine 10 MG  tablet Commonly known as: FLEXERIL Take 10 mg by mouth daily after breakfast.   empagliflozin 10 MG Tabs tablet Commonly known as: JARDIANCE Take 1 tablet (10 mg total) by mouth daily.   Fish Oil 1000 MG Caps Take 1,000 mg by mouth in the morning and at bedtime.   furosemide 20 MG tablet Commonly known as: LASIX Take 3 tablets (60 mg total) by mouth 2 (two) times daily. What changed:  medication strength how much to take   gabapentin 100 MG capsule Commonly known as: NEURONTIN Take 2 capsules (200 mg total) by mouth 2 (two) times daily.   hydrocortisone 2.5 % rectal cream Commonly known as: ANUSOL-HC Apply 1 Application topically 4 (four) times daily as needed for hemorrhoids.   isosorbide mononitrate 30 MG 24 hr tablet Commonly known as: IMDUR Take 1 tablet (30 mg total) by mouth daily.   levalbuterol 45 MCG/ACT inhaler Commonly known as: XOPENEX HFA Inhale 2 puffs into the lungs every 6 (six) hours as needed for wheezing or shortness of breath.   levothyroxine 100 MCG tablet Commonly known as: SYNTHROID Take 100 mcg by mouth daily before breakfast.   lidocaine 5 % Commonly known as: LIDODERM Place 2 patches onto the skin daily.   Magnesium 200 MG Chew Chew 400 mg by mouth daily.   metoprolol succinate 25 MG 24 hr tablet Commonly known as: TOPROL-XL Take 12.5 mg by mouth daily.   mometasone 220 MCG/INH inhaler Commonly known as: ASMANEX Inhale 2 puffs into the lungs 2 (two) times daily.   montelukast 10 MG tablet Commonly known as: SINGULAIR Take 10 mg by mouth at bedtime.   multivitamin with minerals Tabs tablet Take 1 tablet by mouth daily with breakfast.   nitroGLYCERIN 0.4 MG SL tablet Commonly known as: NITROSTAT Place 0.4 mg under the tongue every 5 (five) minutes as needed for chest pain.   omeprazole 20 MG capsule Commonly known as: PRILOSEC Take 40 mg by mouth daily.   OXYGEN Inhale 4 L into the lungs continuous.   potassium chloride SA  20 MEQ tablet Commonly known as: KLOR-CON M Take 1 tablet (20 mEq total) by mouth 2 (two) times daily.   PreserVision AREDS 2 Caps Take 1 capsule by mouth 2 (two) times daily.   rosuvastatin 40 MG tablet Commonly known as: CRESTOR Take 20 mg by mouth at bedtime.   Tiotropium Bromide-Olodaterol 2.5-2.5 MCG/ACT Aers Inhale 2 puffs into the lungs daily.   Vitamin D3 50 MCG (2000 UT) Tabs Take 2,000 Units by mouth daily.        Follow-up Information      [facility]. Go in 11 day(s).   Specialty: Cardiology Why: Hospital follow up 07/21/2022 @ 11 am PLEASE bring a current medication list to appointment FREE valet parking, Entrance C, off [employer] information: [address] [id number] mc [place] [address] [phone]  Discharge Exam: Filed Weights   07/09/22 0454 07/10/22 0300 07/11/22 0516  Weight: 103 kg 101.2 kg 101.6 kg   General exam: Appears calm and comfortable  Respiratory system:  air entry fair, no wheezing heard. On 5 lit of [place] Oxygen.  Cardiovascular system: S1 & S2 heard, RRR.  Gastrointestinal system: Abdomen is nondistended, soft and nontender.  Central nervous system: Alert and oriented. Extremities: Symmetric 5 x 5 power. Skin: No rashes,  Psychiatry:  Mood & affect appropriate.    Condition at discharge: fair  The results of significant diagnostics from this hospitalization (including imaging, microbiology, ancillary and laboratory) are listed below for reference.   Imaging Studies: CT CHEST WO CONTRAST  Result Date: 07/10/2022 CLINICAL DATA:  Chronic dyspnea. EXAM: CT CHEST WITHOUT CONTRAST TECHNIQUE: Multidetector CT imaging of the chest was performed following the standard protocol without IV contrast. RADIATION DOSE REDUCTION: This exam was performed according to the departmental dose-optimization program which includes automated exposure  control, adjustment of the mA and/or kV according to patient size and/or use of iterative reconstruction technique. COMPARISON:  April 25, 2021. FINDINGS: Cardiovascular: Status post coronary artery bypass graft. Atherosclerosis of thoracic aorta is noted without aneurysm formation. Normal cardiac size. No pericardial effusion. Mediastinum/Nodes: No enlarged mediastinal or axillary lymph nodes. Thyroid gland, trachea, and esophagus demonstrate no significant findings. Lungs/Pleura: Small left pleural effusion is noted with adjacent subsegmental atelectasis. Calcified pleural plaques are noted in right hemithorax suggesting prior asbestos exposure. Emphysematous disease is noted throughout both lungs. Upper Abdomen: Cholelithiasis. Musculoskeletal: No chest wall mass or suspicious bone lesions identified. IMPRESSION: Small left pleural effusion is noted with adjacent left basilar subsegmental atelectasis. Calcified pleural plaques are noted in right hemithorax suggesting prior asbestos exposure. Emphysematous disease is noted throughout both lungs. Cholelithiasis. Status post coronary artery bypass graft. Aortic Atherosclerosis (ICD10-I70.0) and Emphysema (ICD10-J43.9). Electronically Signed   By: Lupita Raider M.D.   On: 07/10/2022 13:18   ECHOCARDIOGRAM COMPLETE  Result Date: 07/08/2022    ECHOCARDIOGRAM REPORT   Patient Name:   Max Rivas Date of Exam: 07/08/2022 Medical Rec #:  [id number]      Height:       71.0 in Accession #:    [id number]     Weight:       223.1 lb Date of Birth:  [birth year]      BSA:          2.209 m Patient Age:    72 years       BP:           113/72 mmHg Patient Gender: M              HR:           64 bpm. Exam Location:  Inpatient Procedure: 2D Echo, Cardiac Doppler and Color Doppler Indications:    CHF-Acute Diastolic 428.31 / I50.31  History:        Patient has prior history of Echocardiogram examinations, most                 recent 06/24/2021. CHF, CAD and Previous Myocardial  Infarction,                 COPD; Risk Factors:Hypertension, Dyslipidemia, Sleep Apnea and                 Former Smoker.  Sonographer:    Aron Baba Referring Phys: [id number] JENNIFER YATES  Sonographer Comments: Technically difficult study due to poor echo windows.  IMPRESSIONS  1. Left ventricular ejection fraction, by estimation, is 30 to 35%. The left ventricle has moderately decreased function. The left ventricle demonstrates global hypokinesis. The left ventricular internal cavity size was mildly dilated. Left ventricular diastolic function could not be evaluated.  2. Right ventricular systolic function is moderately reduced. The right ventricular size is moderately enlarged. There is normal pulmonary artery systolic pressure. The estimated right ventricular systolic pressure is 28.1 mmHg.  3. Left atrial size was mildly dilated.  4. The mitral valve is grossly normal. Mild mitral valve regurgitation. No evidence of mitral stenosis.  5. The aortic valve was not well visualized. Aortic valve regurgitation is not visualized. No aortic stenosis is present.  6. The inferior vena cava is dilated in size with >50% respiratory variability, suggesting right atrial pressure of 8 mmHg. Comparison(s): Changes from prior study are noted. The left ventricular function is worsened. FINDINGS  Left Ventricle: Left ventricular ejection fraction, by estimation, is 30 to 35%. The left ventricle has moderately decreased function. The left ventricle demonstrates global hypokinesis. Definity contrast agent was given IV to delineate the left ventricular endocardial borders. The left ventricular internal cavity size was mildly dilated. There is no left ventricular hypertrophy. Left ventricular diastolic function could not be evaluated due to atrial fibrillation. Left ventricular diastolic function could not be evaluated. Right Ventricle: The right ventricular size is moderately enlarged. No increase in right ventricular wall thickness.  Right ventricular systolic function is moderately reduced. There is normal pulmonary artery systolic pressure. The tricuspid regurgitant velocity is 2.24 m/s, and with an assumed right atrial pressure of 8 mmHg, the estimated right ventricular systolic pressure is 28.1 mmHg. Left Atrium: Left atrial size was mildly dilated. Right Atrium: Right atrial size was normal in size. Pericardium: There is no evidence of pericardial effusion. Mitral Valve: The mitral valve is grossly normal. Mild mitral valve regurgitation. No evidence of mitral valve stenosis. Tricuspid Valve: The tricuspid valve is grossly normal. Tricuspid valve regurgitation is trivial. No evidence of tricuspid stenosis. Aortic Valve: The aortic valve was not well visualized. Aortic valve regurgitation is not visualized. No aortic stenosis is present. Pulmonic Valve: The pulmonic valve was not well visualized. Pulmonic valve regurgitation is not visualized. Aorta: The aortic root and ascending aorta are structurally normal, with no evidence of dilitation. Venous: The inferior vena cava is dilated in size with greater than 50% respiratory variability, suggesting right atrial pressure of 8 mmHg. IAS/Shunts: The atrial septum is grossly normal. Additional Comments: A device lead is visualized in the right ventricle and right atrium.  LEFT VENTRICLE PLAX 2D LVIDd:         5.90 cm   Diastology LVIDs:         5.20 cm   LV e' medial:    6.66 cm/s LV PW:         1.20 cm   LV E/e' medial:  17.1 LV IVS:        0.90 cm   LV e' lateral:   6.46 cm/s LVOT diam:     1.90 cm   LV E/e' lateral: 17.6 LV SV:         37 LV SV Index:   17 LVOT Area:     2.84 cm  RIGHT VENTRICLE RV S prime:     6.64 cm/s TAPSE (M-mode): 1.2 cm LEFT ATRIUM             Index        RIGHT ATRIUM  Index LA diam:        5.10 cm 2.31 cm/m   RA Area:     34.00 cm LA Vol (A2C):   80.8 ml 36.58 ml/m  RA Volume:   133.00 ml 60.21 ml/m LA Vol (A4C):   73.4 ml 33.23 ml/m LA Biplane Vol:  78.7 ml 35.63 ml/m  AORTIC VALVE LVOT Vmax:   64.60 cm/s LVOT Vmean:  40.200 cm/s LVOT VTI:    0.129 m  AORTA Ao Root diam: 4.00 cm Ao Asc diam:  3.40 cm MITRAL VALVE                 TRICUSPID VALVE MV Area (PHT): 4.49 cm      TR Peak grad:   20.1 mmHg MV Decel Time: 169 msec      TR Vmax:        224.00 cm/s MR Peak grad:   76.4 mmHg MR Mean grad:   48.0 mmHg    SHUNTS MR Vmax:        437.00 cm/s  Systemic VTI:  0.13 m MR Vmean:       327.0 cm/s   Systemic Diam: 1.90 cm MR PISA:        0.57 cm MR PISA Radius: 0.30 cm MV E velocity: 114.00 cm/s Lennie Odor MD Electronically signed by Lennie Odor MD Signature Date/Time: 07/08/2022/3:15:54 PM    Final    DG Chest Port 1 View  Result Date: 07/07/2022 CLINICAL DATA:  Shortness of breath x2 days EXAM: PORTABLE CHEST 1 VIEW COMPARISON:  Previous studies including the examination done on 04/25/2021 FINDINGS: Transverse diameter of heart is increased. Central pulmonary vessels are prominent. Increased interstitial markings are seen in the parahilar regions and lower lung fields. There is blunting of left lateral CP angle. Extensive pleural calcifications are seen in the periphery of right lung. Blunting of right lateral CP angle has not changed. There is no pneumothorax. Pacemaker/defibrillator battery is seen in left infraclavicular region. There is a metallic clamp in the region of left atrial appendage. Metallic sutures are seen in the sternum. IMPRESSION: Cardiomegaly. Central pulmonary vessels are prominent. Increased interstitial markings are seen in the parahilar regions and lower lung fields suggesting interstitial pneumonia or pulmonary edema. There is new small left pleural effusion. Pleural calcifications in the periphery of right lung and blunting of right lateral CP angle have not changed suggesting scarring. Electronically Signed   By: Ernie Avena M.D.   On: 07/07/2022 13:37    Microbiology: Results for orders placed or performed during the  hospital encounter of 07/07/22  SARS Coronavirus 2 by RT PCR (hospital order, performed in [facility] hospital lab) *cepheid single result test* Anterior Nasal Swab     Status: None   Collection Time: 07/08/22  8:23 AM   Specimen: Anterior Nasal Swab  Result Value Ref Range Status   SARS Coronavirus 2 by RT PCR NEGATIVE NEGATIVE Final    Comment: Performed at [facility], [address] [place], [place] [address]  Respiratory (~20 pathogens) panel by PCR     Status: None   Collection Time: 07/08/22  8:23 AM   Specimen: Nasopharyngeal Swab; Respiratory  Result Value Ref Range Status   Adenovirus NOT DETECTED NOT DETECTED Final   Coronavirus 229E NOT DETECTED NOT DETECTED Final    Comment: (NOTE) The Coronavirus on the Respiratory Panel, DOES NOT test for the novel  Coronavirus (2019 nCoV)    Coronavirus HKU1 NOT DETECTED NOT DETECTED Final  Coronavirus NL63 NOT DETECTED NOT DETECTED Final   Coronavirus OC43 NOT DETECTED NOT DETECTED Final   Metapneumovirus NOT DETECTED NOT DETECTED Final   Rhinovirus / Enterovirus NOT DETECTED NOT DETECTED Final   Influenza A NOT DETECTED NOT DETECTED Final   Influenza B NOT DETECTED NOT DETECTED Final   Parainfluenza Virus 1 NOT DETECTED NOT DETECTED Final   Parainfluenza Virus 2 NOT DETECTED NOT DETECTED Final   Parainfluenza Virus 3 NOT DETECTED NOT DETECTED Final   Parainfluenza Virus 4 NOT DETECTED NOT DETECTED Final   Respiratory Syncytial Virus NOT DETECTED NOT DETECTED Final   Bordetella pertussis NOT DETECTED NOT DETECTED Final   Bordetella Parapertussis NOT DETECTED NOT DETECTED Final   Chlamydophila pneumoniae NOT DETECTED NOT DETECTED Final   Mycoplasma pneumoniae NOT DETECTED NOT DETECTED Final    Comment: Performed at [facility], [address], [place], [place] [address]    Labs: CBC: Recent Labs  Lab 07/07/22 1212 07/07/22 1431 07/08/22 0103 07/09/22 0106 07/10/22 0103 07/11/22 0044  WBC 7.4  --  5.6  14.5* 9.7 9.2  NEUTROABS 5.3  --   --   --   --   --   HGB 14.5 15.0 13.6 13.4 13.3 13.4  HCT 45.2 44.0 41.1 40.5 41.1 41.8  MCV 96.2  --  91.5 93.8 93.2 93.3  PLT 200  --  208 220 209 215   Basic Metabolic Panel: Recent Labs  Lab 07/07/22 1212 07/07/22 1431 07/08/22 0103 07/09/22 0106 07/10/22 0103 07/11/22 0044  NA 137 138 138 139 136 137  K 4.1 3.9 4.2 3.9 4.0 4.2  CL 103 104 100 103 99 100  CO2 20*  --  27 26 28 29   GLUCOSE 101* 96 147* 126* 134* 136*  BUN 11 12 11 23  25* 31*  CREATININE 1.50* 1.40* 1.59* 1.76* 1.65* 1.64*  CALCIUM 8.2*  --  8.3* 8.4* 8.4* 8.7*  MG  --   --   --  2.5* 2.5* 2.5*  PHOS  --   --   --  4.3  --   --    Liver Function Tests: No results for input(s): "AST", "ALT", "ALKPHOS", "BILITOT", "PROT", "ALBUMIN" in the last 168 hours. CBG: No results for input(s): "GLUCAP" in the last 168 hours.  Discharge time spent: 39 minutes.   Signed: Kathlen Mody, MD [facility] 07/12/2022

## 2022-07-20 ENCOUNTER — Other Ambulatory Visit: Payer: Self-pay | Admitting: Internal Medicine

## 2022-07-20 DIAGNOSIS — J441 Chronic obstructive pulmonary disease with (acute) exacerbation: Secondary | ICD-10-CM

## 2022-07-21 ENCOUNTER — Encounter (HOSPITAL_COMMUNITY): Payer: No Typology Code available for payment source

## 2022-08-26 ENCOUNTER — Emergency Department (HOSPITAL_COMMUNITY): Payer: Medicare Other

## 2022-08-26 ENCOUNTER — Inpatient Hospital Stay (HOSPITAL_COMMUNITY)
Admission: EM | Admit: 2022-08-26 | Discharge: 2022-09-08 | DRG: 291 | Disposition: A | Discharge status: Hospice | Payer: Medicare Other | Attending: Internal Medicine | Admitting: Internal Medicine

## 2022-08-26 ENCOUNTER — Other Ambulatory Visit: Payer: Self-pay

## 2022-08-26 DIAGNOSIS — R0902 Hypoxemia: Secondary | ICD-10-CM | POA: Diagnosis present

## 2022-08-26 DIAGNOSIS — E7800 Pure hypercholesterolemia, unspecified: Secondary | ICD-10-CM | POA: Diagnosis present

## 2022-08-26 DIAGNOSIS — I472 Ventricular tachycardia, unspecified: Secondary | ICD-10-CM | POA: Diagnosis present

## 2022-08-26 DIAGNOSIS — Z66 Do not resuscitate: Secondary | ICD-10-CM | POA: Diagnosis present

## 2022-08-26 DIAGNOSIS — I2729 Other secondary pulmonary hypertension: Secondary | ICD-10-CM | POA: Diagnosis present

## 2022-08-26 DIAGNOSIS — Z79899 Other long term (current) drug therapy: Secondary | ICD-10-CM

## 2022-08-26 DIAGNOSIS — I252 Old myocardial infarction: Secondary | ICD-10-CM

## 2022-08-26 DIAGNOSIS — Z1152 Encounter for screening for COVID-19: Secondary | ICD-10-CM

## 2022-08-26 DIAGNOSIS — Z8673 Personal history of transient ischemic attack (TIA), and cerebral infarction without residual deficits: Secondary | ICD-10-CM

## 2022-08-26 DIAGNOSIS — I509 Heart failure, unspecified: Secondary | ICD-10-CM

## 2022-08-26 DIAGNOSIS — I4819 Other persistent atrial fibrillation: Secondary | ICD-10-CM | POA: Diagnosis present

## 2022-08-26 DIAGNOSIS — Z955 Presence of coronary angioplasty implant and graft: Secondary | ICD-10-CM

## 2022-08-26 DIAGNOSIS — J9621 Acute and chronic respiratory failure with hypoxia: Secondary | ICD-10-CM | POA: Diagnosis present

## 2022-08-26 DIAGNOSIS — E876 Hypokalemia: Secondary | ICD-10-CM | POA: Diagnosis present

## 2022-08-26 DIAGNOSIS — F419 Anxiety disorder, unspecified: Secondary | ICD-10-CM | POA: Diagnosis present

## 2022-08-26 DIAGNOSIS — R0602 Shortness of breath: Secondary | ICD-10-CM

## 2022-08-26 DIAGNOSIS — Z6831 Body mass index (BMI) 31.0-31.9, adult: Secondary | ICD-10-CM

## 2022-08-26 DIAGNOSIS — J439 Emphysema, unspecified: Secondary | ICD-10-CM | POA: Diagnosis present

## 2022-08-26 DIAGNOSIS — Z87891 Personal history of nicotine dependence: Secondary | ICD-10-CM

## 2022-08-26 DIAGNOSIS — Z8249 Family history of ischemic heart disease and other diseases of the circulatory system: Secondary | ICD-10-CM

## 2022-08-26 DIAGNOSIS — I110 Hypertensive heart disease with heart failure: Principal | ICD-10-CM | POA: Diagnosis present

## 2022-08-26 DIAGNOSIS — I5082 Biventricular heart failure: Secondary | ICD-10-CM | POA: Diagnosis present

## 2022-08-26 DIAGNOSIS — Z7984 Long term (current) use of oral hypoglycemic drugs: Secondary | ICD-10-CM

## 2022-08-26 DIAGNOSIS — E039 Hypothyroidism, unspecified: Secondary | ICD-10-CM | POA: Diagnosis present

## 2022-08-26 DIAGNOSIS — G4733 Obstructive sleep apnea (adult) (pediatric): Secondary | ICD-10-CM | POA: Diagnosis present

## 2022-08-26 DIAGNOSIS — I5023 Acute on chronic systolic (congestive) heart failure: Secondary | ICD-10-CM | POA: Diagnosis present

## 2022-08-26 DIAGNOSIS — Z86711 Personal history of pulmonary embolism: Secondary | ICD-10-CM

## 2022-08-26 DIAGNOSIS — Z7189 Other specified counseling: Secondary | ICD-10-CM

## 2022-08-26 DIAGNOSIS — Z95810 Presence of automatic (implantable) cardiac defibrillator: Secondary | ICD-10-CM

## 2022-08-26 DIAGNOSIS — N182 Chronic kidney disease, stage 2 (mild): Secondary | ICD-10-CM | POA: Diagnosis present

## 2022-08-26 DIAGNOSIS — I255 Ischemic cardiomyopathy: Secondary | ICD-10-CM | POA: Diagnosis present

## 2022-08-26 DIAGNOSIS — Z951 Presence of aortocoronary bypass graft: Secondary | ICD-10-CM

## 2022-08-26 DIAGNOSIS — F39 Unspecified mood [affective] disorder: Secondary | ICD-10-CM | POA: Diagnosis present

## 2022-08-26 DIAGNOSIS — I959 Hypotension, unspecified: Secondary | ICD-10-CM | POA: Diagnosis present

## 2022-08-26 DIAGNOSIS — Z7901 Long term (current) use of anticoagulants: Secondary | ICD-10-CM

## 2022-08-26 DIAGNOSIS — K219 Gastro-esophageal reflux disease without esophagitis: Secondary | ICD-10-CM | POA: Diagnosis present

## 2022-08-26 DIAGNOSIS — Z7982 Long term (current) use of aspirin: Secondary | ICD-10-CM

## 2022-08-26 DIAGNOSIS — N401 Enlarged prostate with lower urinary tract symptoms: Secondary | ICD-10-CM | POA: Diagnosis present

## 2022-08-26 DIAGNOSIS — R338 Other retention of urine: Secondary | ICD-10-CM | POA: Diagnosis present

## 2022-08-26 DIAGNOSIS — Z9981 Dependence on supplemental oxygen: Secondary | ICD-10-CM

## 2022-08-26 DIAGNOSIS — Z888 Allergy status to other drugs, medicaments and biological substances status: Secondary | ICD-10-CM

## 2022-08-26 DIAGNOSIS — I2510 Atherosclerotic heart disease of native coronary artery without angina pectoris: Secondary | ICD-10-CM | POA: Diagnosis present

## 2022-08-26 DIAGNOSIS — Z515 Encounter for palliative care: Secondary | ICD-10-CM

## 2022-08-26 DIAGNOSIS — N179 Acute kidney failure, unspecified: Secondary | ICD-10-CM | POA: Diagnosis present

## 2022-08-26 DIAGNOSIS — I2489 Other forms of acute ischemic heart disease: Secondary | ICD-10-CM | POA: Diagnosis present

## 2022-08-26 DIAGNOSIS — Z79890 Hormone replacement therapy (postmenopausal): Secondary | ICD-10-CM

## 2022-08-26 DIAGNOSIS — Z7951 Long term (current) use of inhaled steroids: Secondary | ICD-10-CM

## 2022-08-26 LAB — COMPREHENSIVE METABOLIC PANEL
ALT: 12 U/L (ref 0–44)
AST: 26 U/L (ref 15–41)
Albumin: 3.3 g/dL — ABNORMAL LOW (ref 3.5–5.0)
Alkaline Phosphatase: 62 U/L (ref 38–126)
Anion gap: 11 (ref 5–15)
BUN: 13 mg/dL (ref 8–23)
CO2: 25 mmol/L (ref 22–32)
Calcium: 7.8 mg/dL — ABNORMAL LOW (ref 8.9–10.3)
Chloride: 101 mmol/L (ref 98–111)
Creatinine, Ser: 1.37 mg/dL — ABNORMAL HIGH (ref 0.61–1.24)
GFR, Estimated: 55 mL/min — ABNORMAL LOW (ref >60)
Glucose, Bld: 96 mg/dL (ref 70–99)
Potassium: 3.1 mmol/L — ABNORMAL LOW (ref 3.5–5.1)
Sodium: 137 mmol/L (ref 135–145)
Total Bilirubin: 1.3 mg/dL — ABNORMAL HIGH (ref 0.3–1.2)
Total Protein: 6.2 g/dL — ABNORMAL LOW (ref 6.5–8.1)

## 2022-08-26 LAB — CBC WITH DIFFERENTIAL/PLATELET
Abs Immature Granulocytes: 0.01 10*3/uL (ref 0.00–0.07)
Basophils Absolute: 0 10*3/uL (ref 0.0–0.1)
Basophils Relative: 0 %
Eosinophils Absolute: 0 10*3/uL (ref 0.0–0.5)
Eosinophils Relative: 0 %
HCT: 42.8 % (ref 39.0–52.0)
Hemoglobin: 13.8 g/dL (ref 13.0–17.0)
Immature Granulocytes: 0 %
Lymphocytes Relative: 14 %
Lymphs Abs: 1 10*3/uL (ref 0.7–4.0)
MCH: 29.8 pg (ref 26.0–34.0)
MCHC: 32.2 g/dL (ref 30.0–36.0)
MCV: 92.4 fL (ref 80.0–100.0)
Monocytes Absolute: 0.6 10*3/uL (ref 0.1–1.0)
Monocytes Relative: 9 %
Neutro Abs: 5.2 10*3/uL (ref 1.7–7.7)
Neutrophils Relative %: 77 %
Platelets: 154 10*3/uL (ref 150–400)
RBC: 4.63 MIL/uL (ref 4.22–5.81)
RDW: 15.4 % (ref 11.5–15.5)
WBC: 6.9 10*3/uL (ref 4.0–10.5)
nRBC: 0 % (ref 0.0–0.2)

## 2022-08-26 LAB — TROPONIN I (HIGH SENSITIVITY)
Troponin I (High Sensitivity): 16 ng/L (ref <18)
Troponin I (High Sensitivity): 20 ng/L — ABNORMAL HIGH (ref <18)

## 2022-08-26 LAB — SARS CORONAVIRUS 2 BY RT PCR: SARS Coronavirus 2 by RT PCR: NEGATIVE

## 2022-08-26 LAB — BRAIN NATRIURETIC PEPTIDE: B Natriuretic Peptide: 684.4 pg/mL — ABNORMAL HIGH (ref 0.0–100.0)

## 2022-08-26 MED ORDER — ISOSORBIDE MONONITRATE ER 30 MG PO TB24
30.0000 mg | ORAL_TABLET | Freq: Every day | ORAL | Status: DC
  Administered 2022-08-27 – 2022-09-01 (×6): 30 mg via ORAL
  Filled 2022-08-26 (×6): qty 1

## 2022-08-26 MED ORDER — ACETAMINOPHEN 325 MG PO TABS
650.0000 mg | ORAL_TABLET | ORAL | Status: DC | PRN
  Administered 2022-08-28 – 2022-09-04 (×4): 650 mg via ORAL
  Filled 2022-08-26 (×4): qty 2

## 2022-08-26 MED ORDER — SODIUM CHLORIDE 0.9% FLUSH: 3.0000 mL | INTRAVENOUS | Status: DC | PRN

## 2022-08-26 MED ORDER — MAGNESIUM 200 MG PO CHEW: 400.0000 mg | CHEWABLE_TABLET | Freq: Every day | ORAL | Status: DC

## 2022-08-26 MED ORDER — PANTOPRAZOLE SODIUM 40 MG PO TBEC
40.0000 mg | DELAYED_RELEASE_TABLET | Freq: Every day | ORAL | Status: DC
  Administered 2022-08-27 – 2022-09-01 (×6): 40 mg via ORAL
  Filled 2022-08-26 (×6): qty 1

## 2022-08-26 MED ORDER — FUROSEMIDE 10 MG/ML IJ SOLN
60.0000 mg | Freq: Once | INTRAMUSCULAR | Status: AC
  Administered 2022-08-26: 60 mg via INTRAVENOUS
  Filled 2022-08-26: qty 8

## 2022-08-26 MED ORDER — GABAPENTIN 100 MG PO CAPS
200.0000 mg | ORAL_CAPSULE | Freq: Two times a day (BID) | ORAL | Status: DC
  Administered 2022-08-26 – 2022-09-08 (×26): 200 mg via ORAL
  Filled 2022-08-26 (×26): qty 2

## 2022-08-26 MED ORDER — ONDANSETRON HCL 4 MG/2ML IJ SOLN: 4.0000 mg | Freq: Four times a day (QID) | INTRAMUSCULAR | Status: DC | PRN

## 2022-08-26 MED ORDER — LEVOTHYROXINE SODIUM 100 MCG PO TABS
100.0000 ug | ORAL_TABLET | Freq: Every day | ORAL | Status: DC
  Administered 2022-08-27 – 2022-09-08 (×13): 100 ug via ORAL
  Filled 2022-08-26 (×13): qty 1

## 2022-08-26 MED ORDER — SODIUM CHLORIDE 0.9% FLUSH
3.0000 mL | Freq: Two times a day (BID) | INTRAVENOUS | Status: DC
  Administered 2022-08-26 – 2022-09-08 (×26): 3 mL via INTRAVENOUS

## 2022-08-26 MED ORDER — MAGNESIUM OXIDE -MG SUPPLEMENT 400 (240 MG) MG PO TABS
400.0000 mg | ORAL_TABLET | Freq: Every day | ORAL | Status: DC
  Administered 2022-08-27: 400 mg via ORAL
  Filled 2022-08-26 (×2): qty 1

## 2022-08-26 MED ORDER — POTASSIUM CHLORIDE CRYS ER 20 MEQ PO TBCR
20.0000 meq | EXTENDED_RELEASE_TABLET | Freq: Two times a day (BID) | ORAL | Status: DC
  Administered 2022-08-26 – 2022-08-31 (×11): 20 meq via ORAL
  Filled 2022-08-26 (×11): qty 1

## 2022-08-26 MED ORDER — FUROSEMIDE 10 MG/ML IJ SOLN: 40.0000 mg | Freq: Every day | INTRAMUSCULAR | Status: DC

## 2022-08-26 MED ORDER — MONTELUKAST SODIUM 10 MG PO TABS
10.0000 mg | ORAL_TABLET | Freq: Every day | ORAL | Status: DC
  Administered 2022-08-26 – 2022-09-07 (×13): 10 mg via ORAL
  Filled 2022-08-26 (×13): qty 1

## 2022-08-26 MED ORDER — SODIUM CHLORIDE 0.9 % IV SOLN: 250.0000 mL | INTRAVENOUS | Status: DC | PRN

## 2022-08-26 MED ORDER — PROSIGHT PO TABS
1.0000 | ORAL_TABLET | Freq: Every day | ORAL | Status: DC
  Administered 2022-08-27 – 2022-09-08 (×11): 1 via ORAL
  Filled 2022-08-26 (×13): qty 1

## 2022-08-26 MED ORDER — ADULT MULTIVITAMIN W/MINERALS CH
1.0000 | ORAL_TABLET | Freq: Every day | ORAL | Status: DC
  Administered 2022-08-27 – 2022-09-08 (×13): 1 via ORAL
  Filled 2022-08-26 (×13): qty 1

## 2022-08-26 MED ORDER — VITAMIN D 25 MCG (1000 UNIT) PO TABS
2000.0000 [IU] | ORAL_TABLET | Freq: Every day | ORAL | Status: DC
  Administered 2022-08-27 – 2022-09-08 (×13): 2000 [IU] via ORAL
  Filled 2022-08-26 (×13): qty 2

## 2022-08-26 MED ORDER — ACETAMINOPHEN 500 MG PO TABS
1000.0000 mg | ORAL_TABLET | Freq: Once | ORAL | Status: AC
  Administered 2022-08-26: 1000 mg via ORAL
  Filled 2022-08-26: qty 2

## 2022-08-26 MED ORDER — APIXABAN 5 MG PO TABS
5.0000 mg | ORAL_TABLET | Freq: Two times a day (BID) | ORAL | Status: DC
  Administered 2022-08-26 – 2022-09-08 (×26): 5 mg via ORAL
  Filled 2022-08-26 (×26): qty 1

## 2022-08-26 MED ORDER — CYCLOBENZAPRINE HCL 10 MG PO TABS
10.0000 mg | ORAL_TABLET | Freq: Every day | ORAL | Status: DC
  Administered 2022-08-27 – 2022-09-08 (×9): 10 mg via ORAL
  Filled 2022-08-26 (×13): qty 1

## 2022-08-26 MED ORDER — EMPAGLIFLOZIN 10 MG PO TABS
10.0000 mg | ORAL_TABLET | Freq: Every day | ORAL | Status: DC
  Administered 2022-08-27 – 2022-08-28 (×2): 10 mg via ORAL
  Filled 2022-08-26 (×3): qty 1

## 2022-08-26 MED ORDER — AMITRIPTYLINE HCL 25 MG PO TABS
25.0000 mg | ORAL_TABLET | Freq: Every day | ORAL | Status: DC
  Administered 2022-08-26 – 2022-09-07 (×13): 25 mg via ORAL
  Filled 2022-08-26 (×13): qty 1

## 2022-08-26 MED ORDER — NITROGLYCERIN 0.4 MG SL SUBL: 0.4000 mg | SUBLINGUAL_TABLET | SUBLINGUAL | Status: DC | PRN

## 2022-08-26 MED ORDER — ARFORMOTEROL TARTRATE 15 MCG/2ML IN NEBU
15.0000 ug | INHALATION_SOLUTION | Freq: Two times a day (BID) | RESPIRATORY_TRACT | Status: DC
  Administered 2022-08-27 – 2022-09-08 (×25): 15 ug via RESPIRATORY_TRACT
  Filled 2022-08-26 (×25): qty 2

## 2022-08-26 MED ORDER — AMIODARONE HCL 200 MG PO TABS
200.0000 mg | ORAL_TABLET | Freq: Two times a day (BID) | ORAL | Status: DC
  Administered 2022-08-26 – 2022-08-27 (×2): 200 mg via ORAL
  Filled 2022-08-26 (×2): qty 1

## 2022-08-26 MED ORDER — METOPROLOL SUCCINATE ER 25 MG PO TB24
12.5000 mg | ORAL_TABLET | Freq: Every day | ORAL | Status: DC
  Administered 2022-08-27 – 2022-09-01 (×6): 12.5 mg via ORAL
  Filled 2022-08-26 (×6): qty 1

## 2022-08-26 MED ORDER — PRESERVISION AREDS 2 PO CAPS: 1.0000 | ORAL_CAPSULE | Freq: Two times a day (BID) | ORAL | Status: DC

## 2022-08-26 MED ORDER — LEVALBUTEROL TARTRATE 45 MCG/ACT IN AERO: 2.0000 | INHALATION_SPRAY | Freq: Four times a day (QID) | RESPIRATORY_TRACT | Status: DC | PRN

## 2022-08-26 MED ORDER — LEVALBUTEROL HCL 0.63 MG/3ML IN NEBU
0.6300 mg | INHALATION_SOLUTION | Freq: Four times a day (QID) | RESPIRATORY_TRACT | Status: DC | PRN
  Administered 2022-09-05: 0.63 mg via RESPIRATORY_TRACT
  Filled 2022-08-26 (×2): qty 3

## 2022-08-26 MED ORDER — MOMETASONE FUROATE 220 MCG/INH IN AEPB
2.0000 | INHALATION_SPRAY | Freq: Two times a day (BID) | RESPIRATORY_TRACT | Status: DC
  Filled 2022-08-26: qty 1

## 2022-08-26 MED ORDER — UMECLIDINIUM BROMIDE 62.5 MCG/ACT IN AEPB
1.0000 | INHALATION_SPRAY | Freq: Every day | RESPIRATORY_TRACT | Status: DC
  Administered 2022-08-27 – 2022-09-01 (×6): 1 via RESPIRATORY_TRACT
  Filled 2022-08-26: qty 7

## 2022-08-26 MED ORDER — ASPIRIN 81 MG PO TBEC
81.0000 mg | DELAYED_RELEASE_TABLET | Freq: Every day | ORAL | Status: DC
  Administered 2022-08-27 – 2022-09-08 (×13): 81 mg via ORAL
  Filled 2022-08-26 (×13): qty 1

## 2022-08-26 MED ORDER — BUDESONIDE 0.5 MG/2ML IN SUSP
0.5000 mg | Freq: Two times a day (BID) | RESPIRATORY_TRACT | Status: DC
  Administered 2022-08-27 – 2022-09-08 (×25): 0.5 mg via RESPIRATORY_TRACT
  Filled 2022-08-26 (×25): qty 2

## 2022-08-26 MED ORDER — ROSUVASTATIN CALCIUM 20 MG PO TABS
20.0000 mg | ORAL_TABLET | Freq: Every day | ORAL | Status: DC
  Administered 2022-08-26 – 2022-09-07 (×13): 20 mg via ORAL
  Filled 2022-08-26 (×13): qty 1

## 2022-08-26 NOTE — H&P (Addendum)
History and Physical   [facility] Admission History and Physical [employer], D.O.    Patient Name: Max Rivas MR#: [id number] Date of Birth: [birth year] Date of Admission: 08/26/2022  Referring MD/NP/PA: Dr. Anitra Lauth Primary Care Physician: [facility]  Chief Complaint:  Chief Complaint  Patient presents with   Shortness of Breath    HPI: Max Rivas is a 72 y.o. male with a known history of CAD status post MI with CABG, chronic systolic congestive heart failure/cardiomyopathy with a EF of 45%, atrial fibrillation on Eliquis, O2 dependent COPD presents to the emergency department for evaluation of 2-day history of progressively worsening shortness of breath, dyspnea on exertion.  He was satting in the mid 50s at home while on his regular 5 L of oxygen.  Dyspnea is associated with minimal nonproductive cough.  Reports 3 pound weight gain  Patient denies fevers/chills, weakness, dizziness, chest pain,  N/V/C/D, abdominal pain, dysuria/frequency, changes in mental status.    Otherwise there has been no change in status. Patient has been taking medication as prescribed and there has been no recent change in medication or diet.  No recent antibiotics.  There has been no recent illness, hospitalizations, travel or sick contacts.    EMS/ED Course: Patient received Tylenol, Lasix. Medical admission has been requested for further management of acute hypoxic respiratory failure secondary to exacerbation of systolic CHF.  Review of Systems:  CONSTITUTIONAL: No fever/chills, fatigue, weakness, weight gain/loss, headache. EYES: No blurry or double vision. ENT: No tinnitus, postnasal drip, redness or soreness of the oropharynx. RESPIRATORY: Positive cough, dyspnea, wheeze.  No hemoptysis.  CARDIOVASCULAR: No chest pain, palpitations, syncope, orthopnea. No lower extremity edema.  GASTROINTESTINAL: No nausea, vomiting, abdominal pain, diarrhea,  constipation.  No hematemesis, melena or hematochezia. GENITOURINARY: No dysuria, frequency, hematuria. ENDOCRINE: No polyuria or nocturia. No heat or cold intolerance. HEMATOLOGY: No anemia, bruising, bleeding. INTEGUMENTARY: No rashes, ulcers, lesions. MUSCULOSKELETAL: No arthritis, gout. NEUROLOGIC: No numbness, tingling, ataxia, seizure-type activity, weakness. PSYCHIATRIC: No anxiety, depression, insomnia.   Past Medical History:  Diagnosis Date   Arthritis    "elbows, knees" (02/24/2016)   Bronchitis 2006   CAD (coronary artery disease)    a. s/p prior MIs - 1995 x 2, 1998; b. s/p prior LCX stenting; c. 04/2019 Cath: LM min irregs, LAD 65p/mi, D1 75, LCX 99ost/p, 15m (ISR), OM2 80, RCA/RPDA mod diff dzs; d. 05/2019 CABG x 3: LIMA->LAD, VG->Diag, VG->OM.   COPD (chronic obstructive pulmonary disease) (HCC)    a. Remote tobacco-->on home O2.   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 05/2019 Echo: EF 40-45%, gr2 DD. Nl RV size/fxn. Mildly dil LA. Mild MR.   High cholesterol    Ischemic cardiomyopathy    a. 05/2019 Echo: EF 40-45%.   Morbid obesity (HCC)    Myocardial infarction (HCC) ~ 1995 X 2;1998; 2000; 2004   On home oxygen therapy    "2L w/activity" (02/24/2016)   PAF (paroxysmal atrial fibrillation) (HCC)    a. CHA2DS2VASc = 4-->eliquis. Also on amio.   Pulmonary embolism (HCC) 02/24/2016   Sleep apnea    "have CPAP; can't tolerate it" (02/24/2016)   TIA (transient ischemic attack) 06/2021   Ventricular tachycardia (HCC)    a. 2005 s/p ICD; b. 03/2011 Device upgrade/lead exchange: MDT Protecta XT VR single lead AICD.    Past Surgical History:  Procedure Laterality Date   CARDIOVERSION N/A  09/11/2019   Procedure: CARDIOVERSION;  Surgeon: Jodelle Red, MD;  Location: [facility] ENDOSCOPY;  Service: Cardiovascular;  Laterality: N/A;   CLIPPING OF ATRIAL APPENDAGE N/A 05/23/2019   Procedure: CLIPPING OF  ATRIAL APPENDAGE with atriclip;  Surgeon: [name], MD;  Location: [facility] OR;  Service: Open Heart Surgery;  Laterality: N/A;   CORONARY ANGIOPLASTY  1995   CORONARY ANGIOPLASTY WITH STENT PLACEMENT  ~ 1995 - 2004 X 5   "I've got a total of 5 stents" (02/24/2016)   CORONARY ARTERY BYPASS GRAFT N/A 05/23/2019   Procedure: CORONARY ARTERY BYPASS GRAFTING (CABG), times three, using right greater saphenous vein and left internal mammary;  Surgeon: [name], MD;  Location: [facility] OR;  Service: Open Heart Surgery;  Laterality: N/A;  [place] only   CORONARY PRESSURE/FFR STUDY N/A 05/12/2019   Procedure: INTRAVASCULAR PRESSURE WIRE/FFR STUDY;  Surgeon: Lyn Records, MD;  Location: [facility] INVASIVE CV LAB;  Service: Cardiovascular;  Laterality: N/A;   heart ablation  2022   INSERT / REPLACE / REMOVE PACEMAKER  07/2003   original PPM around 2004 for ICM with EF < 35%   LEFT HEART CATH AND CORONARY ANGIOGRAPHY N/A 05/12/2019   Procedure: LEFT HEART CATH AND CORONARY ANGIOGRAPHY;  Surgeon: Lyn Records, MD;  Location: [facility] INVASIVE CV LAB;  Service: Cardiovascular;  Laterality: N/A;   PACEMAKER GENERATOR CHANGE  03/2011    [facility]   TEE WITHOUT CARDIOVERSION N/A 05/23/2019   Procedure: TRANSESOPHAGEAL ECHOCARDIOGRAM (TEE);  Surgeon: [name], MD;  Location: [facility] OR;  Service: Open Heart Surgery;  Laterality: N/A;   TONSILLECTOMY       reports that he quit smoking about 3 years ago. His smoking use included cigarettes. He started smoking about 38 years ago. He has a 105 pack-year smoking history. He quit smokeless tobacco use about 5 years ago. He reports that he does not currently use alcohol. He reports that he does not currently use drugs after having used the following drugs: Cocaine.  Allergies  Allergen Reactions   Crestor [Rosuvastatin Calcium] Other (See Comments)    Back spasms, initially, but can tolerate it at a low dose now, in 2022 Tolerating 20mg  QD   Lipitor [Atorvastatin] Other  (See Comments)    Spasticity   Ventolin [Albuterol] Palpitations and Other (See Comments)    Irregular heartbeat    Family History  Problem Relation Age of Onset   Heart attack Mother    Heart attack Father    Heart attack Sister 42    Prior to Admission medications   Medication Sig Start Date End Date Taking? Authorizing Provider  acetaminophen (TYLENOL) 500 MG tablet Take 1,000 mg by mouth every 8 (eight) hours as needed for moderate pain, headache or mild pain.   Yes [provider]  amiodarone (PACERONE) 200 MG tablet Take 1 tablet (200 mg total) by mouth 2 (two) times daily for 7 days, THEN 1 tablet (200 mg total) daily. Patient taking differently: 200 mg once daily. 06/25/21 08/26/22 Yes Elgergawy, Leana Roe, MD  amitriptyline (ELAVIL) 25 MG tablet Take 25 mg by mouth at bedtime.   Yes [provider]  apixaban (ELIQUIS) 5 MG TABS tablet Take 5 mg by mouth 2 (two) times daily.   Yes [provider]  aspirin EC 81 MG tablet Take 81 mg by mouth daily.   Yes [provider]  carboxymethylcellulose 1 % ophthalmic solution Place 1 drop into both eyes 4 (four) times daily.  Yes [provider]  Cholecalciferol (VITAMIN D3) 50 MCG (2000 UT) TABS Take 2,000 Units by mouth daily.   Yes [provider]  cyclobenzaprine (FLEXERIL) 10 MG tablet Take 10 mg by mouth daily after breakfast.   Yes [provider]  empagliflozin (JARDIANCE) 10 MG TABS tablet Take 1 tablet (10 mg total) by mouth daily. 07/12/22  Yes Kathlen Mody, MD  furosemide (LASIX) 20 MG tablet Take 3 tablets (60 mg total) by mouth 2 (two) times daily. 07/11/22  Yes Kathlen Mody, MD  gabapentin (NEURONTIN) 100 MG capsule Take 2 capsules (200 mg total) by mouth 2 (two) times daily. 06/01/19  Yes Barrett, Erin R, PA-C  isosorbide mononitrate (IMDUR) 30 MG 24 hr tablet Take 1 tablet (30 mg total) by mouth daily. 03/13/21  Yes Harris, Abigail, PA-C  levalbuterol [facility] HFA)  45 MCG/ACT inhaler Inhale 2 puffs into the lungs every 6 (six) hours as needed for wheezing or shortness of breath.   Yes [provider]  levothyroxine (SYNTHROID) 100 MCG tablet Take 100 mcg by mouth daily before breakfast.   Yes [provider]  lidocaine (LIDODERM) 5 % Place 2 patches onto the skin daily.   Yes [provider]  Magnesium 200 MG CHEW Chew 400 mg by mouth daily.   Yes [provider]  metoprolol succinate (TOPROL-XL) 25 MG 24 hr tablet Take 12.5 mg by mouth daily.   Yes [provider]  mometasone (ASMANEX) 220 MCG/INH inhaler Inhale 2 puffs into the lungs 2 (two) times daily.   Yes [provider]  montelukast (SINGULAIR) 10 MG tablet Take 10 mg by mouth at bedtime.   Yes [provider]  Multiple Vitamin (MULTIVITAMIN WITH MINERALS) TABS tablet Take 1 tablet by mouth daily with breakfast.   Yes [provider]  Multiple Vitamins-Minerals (PRESERVISION AREDS 2) CAPS Take 1 capsule by mouth 2 (two) times daily.   Yes [provider]  nitroGLYCERIN (NITROSTAT) 0.4 MG SL tablet Place 0.4 mg under the tongue every 5 (five) minutes as needed for chest pain.    Yes [provider]  Omega-3 Fatty Acids (FISH OIL) 1000 MG CAPS Take 1,000 mg by mouth in the morning and at bedtime.   Yes [provider]  omeprazole (PRILOSEC) 20 MG capsule Take 40 mg by mouth daily.   Yes [provider]  OXYGEN Inhale 4 L into the lungs continuous.   Yes [provider]  potassium chloride SA (KLOR-CON) 20 MEQ tablet Take 1 tablet (20 mEq total) by mouth 2 (two) times daily. 03/19/20  Yes Graciella Freer, PA-C  rosuvastatin (CRESTOR) 40 MG tablet Take 20 mg by mouth at bedtime.   Yes [provider]  Tiotropium Bromide-Olodaterol 2.5-2.5 MCG/ACT AERS Inhale 2 puffs into the lungs daily.   Yes [provider]    Physical Exam: Vitals:   08/26/22 1900 08/26/22 1915  08/26/22 1930 08/26/22 1945  BP: 123/71 111/80 107/71 110/73  Pulse: (!) 49 88 78 67  Resp:      Temp:      TempSrc:      SpO2: 94% (!) 84% 90% 95%  Weight:      Height:        GENERAL: 72 y.o.-year-old male patient, well-developed, well-nourished lying in the bed in no acute distress.  Pleasant and cooperative.   HEENT: Head atraumatic, normocephalic. Pupils equal. Mucus membranes moist. NECK: Supple. No JVD. CHEST: Diminished breath sounds at the bases. . No use  of accessory muscles of respiration.  No reproducible chest wall tenderness.  CARDIOVASCULAR: Irregularly irregular.  S1, S2 normal. No murmurs, rubs, or gallops. Cap refill <2 seconds. Pulses intact distally.  ABDOMEN: Soft, nondistended, nontender. No rebound, guarding, rigidity. Normoactive bowel sounds present in all four quadrants.  EXTREMITIES: Minimal nonpitting edema bilateral lower extremities.  No cyanosis, or clubbing. No calf tenderness or Homan's sign.  NEUROLOGIC: The patient is alert and oriented x 3. Cranial nerves II through XII are grossly intact with no focal sensorimotor deficit. PSYCHIATRIC:  Normal affect, mood, thought content. SKIN: Warm, dry, and intact without obvious rash, lesion, or ulcer.    Labs on Admission:  CBC: Recent Labs  Lab 08/26/22 1656  WBC 6.9  NEUTROABS 5.2  HGB 13.8  HCT 42.8  MCV 92.4  PLT 154   Basic Metabolic Panel: Recent Labs  Lab 08/26/22 1656  NA 137  K 3.1*  CL 101  CO2 25  GLUCOSE 96  BUN 13  CREATININE 1.37*  CALCIUM 7.8*   GFR: Estimated Creatinine Clearance: 62.3 mL/min (A) (by C-G formula based on SCr of 1.37 mg/dL (H)). Liver Function Tests: Recent Labs  Lab 08/26/22 1656  AST 26  ALT 12  ALKPHOS 62  BILITOT 1.3*  PROT 6.2*  ALBUMIN 3.3*   No results for input(s): "LIPASE", "AMYLASE" in the last 168 hours. No results for input(s): "AMMONIA" in the last 168 hours. Coagulation Profile: No results for input(s): "INR", "PROTIME" in the last  168 hours. Cardiac Enzymes: No results for input(s): "CKTOTAL", "CKMB", "CKMBINDEX", "TROPONINI" in the last 168 hours. BNP (last 3 results) No results for input(s): "PROBNP" in the last 8760 hours. HbA1C: No results for input(s): "HGBA1C" in the last 72 hours. CBG: No results for input(s): "GLUCAP" in the last 168 hours. Lipid Profile: No results for input(s): "CHOL", "HDL", "LDLCALC", "TRIG", "CHOLHDL", "LDLDIRECT" in the last 72 hours. Thyroid Function Tests: No results for input(s): "TSH", "T4TOTAL", "FREET4", "T3FREE", "THYROIDAB" in the last 72 hours. Anemia Panel: No results for input(s): "VITAMINB12", "FOLATE", "FERRITIN", "TIBC", "IRON", "RETICCTPCT" in the last 72 hours. Urine analysis:    Component Value Date/Time   COLORURINE STRAW (A) 06/24/2021 0121   APPEARANCEUR CLEAR 06/24/2021 0121   LABSPEC 1.002 (L) 06/24/2021 0121   PHURINE 7.0 06/24/2021 0121   GLUCOSEU NEGATIVE 06/24/2021 0121   HGBUR NEGATIVE 06/24/2021 0121   BILIRUBINUR NEGATIVE 06/24/2021 0121   KETONESUR NEGATIVE 06/24/2021 0121   PROTEINUR NEGATIVE 06/24/2021 0121   NITRITE NEGATIVE 06/24/2021 0121   LEUKOCYTESUR NEGATIVE 06/24/2021 0121   Sepsis Labs: @LABRCNTIP (procalcitonin:4,lacticidven:4) ) Recent Results (from the past 240 hour(s))  SARS Coronavirus 2 by RT PCR (hospital order, performed in [facility] hospital lab) *cepheid single result test* Anterior Nasal Swab     Status: None   Collection Time: 08/26/22  4:38 PM   Specimen: Anterior Nasal Swab  Result Value Ref Range Status   SARS Coronavirus 2 by RT PCR NEGATIVE NEGATIVE Final    Comment: (NOTE) SARS-CoV-2 target nucleic acids are NOT DETECTED.  The SARS-CoV-2 RNA is generally detectable in upper and lower respiratory specimens during the acute phase of infection. The lowest concentration of SARS-CoV-2 viral copies this assay can detect is 250 copies / mL. A negative result does not preclude SARS-CoV-2 infection and should not be  used as the sole basis for treatment or other patient management decisions.  A negative result may occur with improper specimen collection / handling, submission of specimen other than nasopharyngeal swab, presence  of viral mutation(s) within the areas targeted by this assay, and inadequate number of viral copies (<250 copies / mL). A negative result must be combined with clinical observations, patient history, and epidemiological information.  Fact Sheet for Patients:   RoadLapTop.co.za  Fact Sheet for Healthcare Providers: http://kim-miller.com/  This test is not yet approved or  cleared by the [place] FDA and has been authorized for detection and/or diagnosis of SARS-CoV-2 by FDA under an Emergency Use Authorization (EUA).  This EUA will remain in effect (meaning this test can be used) for the duration of the COVID-19 declaration under Section 564(b)(1) of the Act, 21 U.S.C. section 360bbb-3(b)(1), unless the authorization is terminated or revoked sooner.  Performed at [facility], [address] [place], [place] [address]      Radiological Exams on Admission: DG Chest 2 View  Result Date: 08/26/2022 CLINICAL DATA:  Shortness of breath EXAM: CHEST - 2 VIEW COMPARISON:  CT 07/10/2022, chest x-ray 07/07/2022 FINDINGS: Left-sided pacing device as before. Sternotomy with atrial appendage clip. Extensive right-sided calcified pleural plaques and chronic pleural thickening. Possible small left effusion. Cardiomegaly with central vascular congestion. Heterogeneous ground-glass opacity in the mid to lower lungs. No pneumothorax. Emphysema. Old left clavicle deformity. IMPRESSION: 1. Cardiomegaly with central vascular congestion. Heterogeneous ground-glass opacity in the mid to lower lungs may reflect edema or pneumonia. 2. Chronic right pleural thickening with calcified pleural plaques. Electronically Signed   By: Jasmine Pang M.D.   On: 08/26/2022 16:35    EKG: Atrial fibrillation at 79  Assessment/Plan  This is a 72 y.o. male with a history of CAD status post MI with CABG, chronic systolic congestive heart failure/cardiomyopathy with a EF of 45%, atrial fibrillation on Eliquis, O2 dependent COPD  now being admitted with:  #. Acute hypoxic respiratory failure secondary to exacerbation of chronic systolic congestive heart failure -Admit observation - Telemetry monitoring. -Continue Lasix, isosorbide, metoprolol, Crestor  - Intake/output, daily weight. - Trend troponins, check lipids and TSH. - Echo -Consider cardiology consultation if not improving   #.  Mild hypokalemia -Replace orally - Check magnesium level  #.  CKD actually better than baseline -Monitor BMP  #. History of CAD - Continue aspirin  #. History of atrial fibrillation - Continue amiodarone, metoprolol, Eliquis  #. History of COPD - Continue leave albuterol, mometasone, montelukast, Brovana and Incruse  #. History of GERD - Continue Protonix   Admission status: Observation telemetry IV Fluids: Hep-Lock Diet/Nutrition: Heart healthy Consults called: None DVT Px: Lovenox, SCDs and early ambulation. Code Status: Full Code  Disposition Plan: To home in 1-2 days  All the records are reviewed and case discussed with ED provider. Management plans discussed with the patient and/or family who express understanding and agree with plan of care.    D.O. on 08/26/2022 at 8:17 PM CC: Primary care physician; [facility], [facility]   08/26/2022, 8:17 PM

## 2022-08-26 NOTE — ED Triage Notes (Signed)
Pt BIBA from home. C/o SOB that has worsened over past 2x days.   On 5L [place] at baseline- was desatting into 60s during exertion.  Placed on 10L [facility]  Hx COPD, and emphysema   AOX4

## 2022-08-26 NOTE — ED Provider Notes (Signed)
[facility] AT [facility] Provider Note   CSN: [id number] Arrival date & time: 08/26/22  1546     History  Chief Complaint  Patient presents with   Shortness of Breath    Max Rivas is a 72 y.o. male.  Pt is a 72y/o male with history of CAD status post CABG, COPD on 5 L nasal cannula, chronic systolic CHF, A-fib on Eliquis, cardiomyopathy with EF 6/23 of 45%, HLD, OSA who is presenting today with worsening shortness of breath.  Patient reports that for the last 2 days he has noticed with any exertion he gets extremely winded and his oxygen sats will drop to 50 or 60% on his home 5 L.  He also has noticed it takes him a significant amount of time to recover.  He also cannot go the distance he had been able to go prior to 2 days ago.  He has had minimal cough and today had 1 episode of coughing up clear phlegm but has not had fever or persistent cough.  He has been compliant with his medications denies excessive salt intake.  He does drink 4 16 ounce bottles of water per day which is unchanged.  He does check his weight daily and has noticed some increased swelling in his legs and he was up 3 pounds today but he reports it has been up and down.  He is on diuretics twice daily which has been unchanged since June.  He is currently denying any abdominal pain nausea or vomiting.  The history is provided by the patient and medical records.  Shortness of Breath      Home Medications Prior to Admission medications   Medication Sig Start Date End Date Taking? Authorizing Provider  acetaminophen (TYLENOL) 500 MG tablet Take 1,000 mg by mouth every 8 (eight) hours as needed for moderate pain, headache or mild pain.    [provider]  amiodarone (PACERONE) 200 MG tablet Take 1 tablet (200 mg total) by mouth 2 (two) times daily for 7 days, THEN 1 tablet (200 mg total) daily. Patient taking differently: 200 mg once daily. 06/25/21 08/22/22  Elgergawy, Leana Roe, MD  amitriptyline (ELAVIL) 25 MG tablet Take 25 mg by mouth at bedtime.    [provider]  apixaban (ELIQUIS) 5 MG TABS tablet Take 5 mg by mouth 2 (two) times daily.    [provider]  aspirin EC 81 MG tablet Take 81 mg by mouth daily.    [provider]  carboxymethylcellulose 1 % ophthalmic solution Place 1 drop into both eyes 4 (four) times daily.    [provider]  Cholecalciferol (VITAMIN D3) 50 MCG (2000 UT) TABS Take 2,000 Units by mouth daily.    [provider]  cyclobenzaprine (FLEXERIL) 10 MG tablet Take 10 mg by mouth daily after breakfast.    [provider]  empagliflozin (JARDIANCE) 10 MG TABS tablet Take 1 tablet (10 mg total) by mouth daily. 07/12/22   Kathlen Mody, MD  furosemide (LASIX) 20 MG tablet Take 3 tablets (60 mg total) by mouth 2 (two) times daily. 07/11/22   Kathlen Mody, MD  gabapentin (NEURONTIN) 100 MG capsule Take 2 capsules (200 mg total) by mouth 2 (two) times daily. 06/01/19   Barrett, Rae Roam, PA-C  hydrocortisone (ANUSOL-HC) 2.5 % rectal cream Apply 1 Application topically 4 (four) times daily as needed for hemorrhoids. 07/11/22   Kathlen Mody, MD  isosorbide mononitrate (IMDUR) 30 MG 24  hr tablet Take 1 tablet (30 mg total) by mouth daily. 03/13/21   Arthor Captain, PA-C  levalbuterol [facility] HFA) 45 MCG/ACT inhaler Inhale 2 puffs into the lungs every 6 (six) hours as needed for wheezing or shortness of breath.    [provider]  levothyroxine (SYNTHROID) 100 MCG tablet Take 100 mcg by mouth daily before breakfast.    [provider]  lidocaine (LIDODERM) 5 % Place 2 patches onto the skin daily.    [provider]  Magnesium 200 MG CHEW Chew 400 mg by mouth daily.    [provider]  metoprolol succinate (TOPROL-XL) 25 MG 24 hr tablet Take 12.5 mg by mouth daily.    [provider]  mometasone [facility]) 220 MCG/INH inhaler Inhale 2 puffs into the lungs 2  (two) times daily.    [provider]  montelukast (SINGULAIR) 10 MG tablet Take 10 mg by mouth at bedtime.    [provider]  Multiple Vitamin (MULTIVITAMIN WITH MINERALS) TABS tablet Take 1 tablet by mouth daily with breakfast.    [provider]  Multiple Vitamins-Minerals (PRESERVISION AREDS 2) CAPS Take 1 capsule by mouth 2 (two) times daily.    [provider]  nitroGLYCERIN (NITROSTAT) 0.4 MG SL tablet Place 0.4 mg under the tongue every 5 (five) minutes as needed for chest pain.     [provider]  Omega-3 Fatty Acids (FISH OIL) 1000 MG CAPS Take 1,000 mg by mouth in the morning and at bedtime.    [provider]  omeprazole (PRILOSEC) 20 MG capsule Take 40 mg by mouth daily.    [provider]  OXYGEN Inhale 4 L into the lungs continuous.    [provider]  potassium chloride SA (KLOR-CON) 20 MEQ tablet Take 1 tablet (20 mEq total) by mouth 2 (two) times daily. 03/19/20   Graciella Freer, PA-C  rosuvastatin (CRESTOR) 40 MG tablet Take 20 mg by mouth at bedtime.    [provider]  Tiotropium Bromide-Olodaterol 2.5-2.5 MCG/ACT AERS Inhale 2 puffs into the lungs daily.    [provider]      Allergies    Crestor [rosuvastatin calcium], Lipitor [atorvastatin], and Ventolin [albuterol]    Review of Systems   Review of Systems  Respiratory:  Positive for shortness of breath.     Physical Exam Updated Vital Signs BP 106/72   Pulse 73   Temp 97.9 F (36.6 C) (Oral)   Resp 18   Ht 5\' 11"  (1.803 m)   Wt 106.6 kg   SpO2 92%   BMI 32.78 kg/m  Physical Exam Vitals and nursing note reviewed.  Constitutional:      General: He is not in acute distress.    Appearance: He is well-developed.  HENT:     Head: Normocephalic and atraumatic.  Eyes:     Conjunctiva/sclera: Conjunctivae normal.     Pupils: Pupils are equal, round, and reactive to light.  Cardiovascular:     Rate and  Rhythm: Normal rate. Rhythm irregularly irregular.     Heart sounds: No murmur heard. Pulmonary:     Effort: Pulmonary effort is normal. No respiratory distress.     Breath sounds: Wheezing present. No rales.     Comments: Scant wheezes Abdominal:     General: There is no distension.     Palpations: Abdomen is soft.     Tenderness: There is no abdominal tenderness. There is no guarding or rebound.  Hernia: A hernia is present.     Comments: Soft reducible abdominal hernia  Musculoskeletal:        General: No tenderness. Normal range of motion.     Cervical back: Normal range of motion and neck supple.     Comments: Trace edema bilateral lower extremities  Skin:    General: Skin is warm and dry.     Findings: No erythema or rash.  Neurological:     Mental Status: He is alert and oriented to person, place, and time. Mental status is at baseline.  Psychiatric:        Behavior: Behavior normal.     ED Results / Procedures / Treatments   Labs (all labs ordered are listed, but only abnormal results are displayed) Labs Reviewed  COMPREHENSIVE METABOLIC PANEL - Abnormal; Notable for the following components:      Result Value   Potassium 3.1 (*)    Creatinine, Ser 1.37 (*)    Calcium 7.8 (*)    Total Protein 6.2 (*)    Albumin 3.3 (*)    Total Bilirubin 1.3 (*)    GFR, Estimated 55 (*)    All other components within normal limits  BRAIN NATRIURETIC PEPTIDE - Abnormal; Notable for the following components:   B Natriuretic Peptide 684.4 (*)    All other components within normal limits  SARS CORONAVIRUS 2 BY RT PCR  CBC WITH DIFFERENTIAL/PLATELET  TROPONIN I (HIGH SENSITIVITY)  TROPONIN I (HIGH SENSITIVITY)    EKG EKG Interpretation Date/Time:  Wednesday August 26 2022 16:01:05 EDT Ventricular Rate:  79 PR Interval:    QRS Duration:  154 QT Interval:  463 QTC Calculation: 531 R Axis:   24  Text Interpretation: recurrent Atrial fibrillation IVCD, consider atypical  LBBB Confirmed by Gwyneth Sprout ([id number]) on 08/26/2022 4:25:36 PM  Radiology DG Chest 2 View  Result Date: 08/26/2022 CLINICAL DATA:  Shortness of breath EXAM: CHEST - 2 VIEW COMPARISON:  CT 07/10/2022, chest x-ray 07/07/2022 FINDINGS: Left-sided pacing device as before. Sternotomy with atrial appendage clip. Extensive right-sided calcified pleural plaques and chronic pleural thickening. Possible small left effusion. Cardiomegaly with central vascular congestion. Heterogeneous ground-glass opacity in the mid to lower lungs. No pneumothorax. Emphysema. Old left clavicle deformity. IMPRESSION: 1. Cardiomegaly with central vascular congestion. Heterogeneous ground-glass opacity in the mid to lower lungs may reflect edema or pneumonia. 2. Chronic right pleural thickening with calcified pleural plaques. Electronically Signed   By: Jasmine Pang M.D.   On: 08/26/2022 16:35    Procedures Procedures    Medications Ordered in ED Medications  acetaminophen (TYLENOL) tablet 1,000 mg (has no administration in time range)  furosemide (LASIX) injection 60 mg (60 mg Intravenous Given 08/26/22 1942)    ED Course/ Medical Decision Making/ A&P                                 Medical Decision Making Amount and/or Complexity of Data Reviewed External Data Reviewed: notes. Labs: ordered. Decision-making details documented in ED Course. Radiology: ordered and independent interpretation performed. Decision-making details documented in ED Course. ECG/medicine tests: ordered and independent interpretation performed. Decision-making details documented in ED Course.  Risk OTC drugs. Prescription drug management. Decision regarding hospitalization.   Pt with multiple medical problems and comorbidities and presenting today with a complaint that caries a high risk for morbidity and mortality.  Here today with worsening dyspnea on exertion.  Concern for COPD  exacerbation versus CHF exacerbation versus  exacerbated symptoms because patient is now in atrial fibrillation which she goes in and out of.  Lower suspicion for infectious etiology based on patient's complaints.  Patient initially was placed on nonrebreather but then was transitioned over to his home 5 L.  He reports right now without moving he is feeling better.  He has scant wheezing but no indication for nebs at this time.  I independently interpreted patient's EKG which shows atrial fibrillation but no other acute changes.  I have independently visualized and interpreted pt's images today.  Chest x-ray today with cardiomegaly and concern for pulmonary edema. 7:46 PM I independently entered the patient's labs and CBC within normal limits, BNP elevated today at 684 from 200 when he was last here, initial troponin is flat at 16, CMP with creatinine of 1.37 is unchanged from prior and hypokalemia of 3.1, COVID is negative.  On repeat evaluation at room air on his 5 L patient is between 87 and 89%.  Given his extent of the shortness of breath and concern for fluid overload based on his labs we will admit for diuresis.  Patient given IV Lasix.  Consulted the hospitalist for admission.  Discussed this with the patient and he is comfortable with this plan.  CRITICAL CARE Performed by:   Total critical care time: 30 minutes Critical care time was exclusive of separately billable procedures and treating other patients. Critical care was necessary to treat or prevent imminent or life-threatening deterioration. Critical care was time spent personally by me on the following activities: development of treatment plan with patient and/or surrogate as well as nursing, discussions with consultants, evaluation of patient's response to treatment, examination of patient, obtaining history from patient or surrogate, ordering and performing treatments and interventions, ordering and review of laboratory studies, ordering and review of radiographic  studies, pulse oximetry and re-evaluation of patient's condition.          Final Clinical Impression(s) / ED Diagnoses Final diagnoses:  Acute on chronic congestive heart failure, unspecified heart failure type [facility])  Hypoxia    Rx / DC Orders ED Discharge Orders     None         Gwyneth Sprout, MD 08/26/22 1946

## 2022-08-27 ENCOUNTER — Encounter (HOSPITAL_COMMUNITY): Payer: Self-pay | Admitting: Family Medicine

## 2022-08-27 ENCOUNTER — Observation Stay (HOSPITAL_COMMUNITY): Payer: Medicare Other

## 2022-08-27 DIAGNOSIS — N179 Acute kidney failure, unspecified: Secondary | ICD-10-CM | POA: Diagnosis present

## 2022-08-27 DIAGNOSIS — I509 Heart failure, unspecified: Secondary | ICD-10-CM

## 2022-08-27 DIAGNOSIS — Z66 Do not resuscitate: Secondary | ICD-10-CM | POA: Diagnosis present

## 2022-08-27 DIAGNOSIS — J439 Emphysema, unspecified: Secondary | ICD-10-CM | POA: Diagnosis present

## 2022-08-27 DIAGNOSIS — Z7901 Long term (current) use of anticoagulants: Secondary | ICD-10-CM | POA: Diagnosis not present

## 2022-08-27 DIAGNOSIS — I252 Old myocardial infarction: Secondary | ICD-10-CM | POA: Diagnosis not present

## 2022-08-27 DIAGNOSIS — I2510 Atherosclerotic heart disease of native coronary artery without angina pectoris: Secondary | ICD-10-CM | POA: Diagnosis present

## 2022-08-27 DIAGNOSIS — Z1152 Encounter for screening for COVID-19: Secondary | ICD-10-CM | POA: Diagnosis not present

## 2022-08-27 DIAGNOSIS — Z515 Encounter for palliative care: Secondary | ICD-10-CM | POA: Diagnosis not present

## 2022-08-27 DIAGNOSIS — I4819 Other persistent atrial fibrillation: Secondary | ICD-10-CM | POA: Diagnosis present

## 2022-08-27 DIAGNOSIS — F419 Anxiety disorder, unspecified: Secondary | ICD-10-CM | POA: Diagnosis present

## 2022-08-27 DIAGNOSIS — I472 Ventricular tachycardia, unspecified: Secondary | ICD-10-CM | POA: Diagnosis present

## 2022-08-27 DIAGNOSIS — E039 Hypothyroidism, unspecified: Secondary | ICD-10-CM | POA: Diagnosis present

## 2022-08-27 DIAGNOSIS — I5023 Acute on chronic systolic (congestive) heart failure: Secondary | ICD-10-CM | POA: Diagnosis not present

## 2022-08-27 DIAGNOSIS — R0902 Hypoxemia: Secondary | ICD-10-CM | POA: Diagnosis present

## 2022-08-27 DIAGNOSIS — I2489 Other forms of acute ischemic heart disease: Secondary | ICD-10-CM | POA: Diagnosis present

## 2022-08-27 DIAGNOSIS — I2729 Other secondary pulmonary hypertension: Secondary | ICD-10-CM | POA: Diagnosis present

## 2022-08-27 DIAGNOSIS — I110 Hypertensive heart disease with heart failure: Secondary | ICD-10-CM | POA: Diagnosis present

## 2022-08-27 DIAGNOSIS — I959 Hypotension, unspecified: Secondary | ICD-10-CM | POA: Diagnosis present

## 2022-08-27 DIAGNOSIS — N182 Chronic kidney disease, stage 2 (mild): Secondary | ICD-10-CM | POA: Diagnosis present

## 2022-08-27 DIAGNOSIS — I5082 Biventricular heart failure: Secondary | ICD-10-CM | POA: Diagnosis present

## 2022-08-27 DIAGNOSIS — J9621 Acute and chronic respiratory failure with hypoxia: Secondary | ICD-10-CM | POA: Diagnosis not present

## 2022-08-27 DIAGNOSIS — F39 Unspecified mood [affective] disorder: Secondary | ICD-10-CM | POA: Diagnosis present

## 2022-08-27 DIAGNOSIS — K219 Gastro-esophageal reflux disease without esophagitis: Secondary | ICD-10-CM | POA: Diagnosis present

## 2022-08-27 DIAGNOSIS — G4733 Obstructive sleep apnea (adult) (pediatric): Secondary | ICD-10-CM | POA: Diagnosis present

## 2022-08-27 DIAGNOSIS — Z7189 Other specified counseling: Secondary | ICD-10-CM | POA: Diagnosis not present

## 2022-08-27 LAB — BASIC METABOLIC PANEL
Anion gap: 11 (ref 5–15)
BUN: 14 mg/dL (ref 8–23)
CO2: 29 mmol/L (ref 22–32)
Calcium: 8.5 mg/dL — ABNORMAL LOW (ref 8.9–10.3)
Chloride: 97 mmol/L — ABNORMAL LOW (ref 98–111)
Creatinine, Ser: 1.83 mg/dL — ABNORMAL HIGH (ref 0.61–1.24)
GFR, Estimated: 39 mL/min — ABNORMAL LOW (ref >60)
Glucose, Bld: 127 mg/dL — ABNORMAL HIGH (ref 70–99)
Potassium: 3.5 mmol/L (ref 3.5–5.1)
Sodium: 137 mmol/L (ref 135–145)

## 2022-08-27 LAB — ECHOCARDIOGRAM COMPLETE
Area-P 1/2: 5.02 cm2
Calc EF: 29.2 %
Height: 71 in
MV M vel: 4.35 m/s
MV Peak grad: 75.7 mmHg
MV VTI: 0.61 cm2
Radius: 0.6 cm
S' Lateral: 4.7 cm
Single Plane A2C EF: 29.9 %
Single Plane A4C EF: 27.9 %
Weight: 3463.87 [oz_av]

## 2022-08-27 LAB — LIPID PANEL
Cholesterol: 115 mg/dL (ref 0–200)
HDL: 36 mg/dL — ABNORMAL LOW (ref >40)
LDL Cholesterol: 61 mg/dL (ref 0–99)
Total CHOL/HDL Ratio: 3.2 ratio
Triglycerides: 90 mg/dL (ref <150)
VLDL: 18 mg/dL (ref 0–40)

## 2022-08-27 LAB — TSH: TSH: 3.915 u[IU]/mL (ref 0.350–4.500)

## 2022-08-27 LAB — TROPONIN I (HIGH SENSITIVITY)
Troponin I (High Sensitivity): 20 ng/L — ABNORMAL HIGH (ref <18)
Troponin I (High Sensitivity): 20 ng/L — ABNORMAL HIGH (ref <18)

## 2022-08-27 MED ORDER — SENNOSIDES-DOCUSATE SODIUM 8.6-50 MG PO TABS
1.0000 | ORAL_TABLET | Freq: Every evening | ORAL | Status: DC | PRN
  Administered 2022-08-29 – 2022-08-30 (×2): 1 via ORAL
  Filled 2022-08-27 (×2): qty 1

## 2022-08-27 MED ORDER — ORAL CARE MOUTH RINSE: 15.0000 mL | OROMUCOSAL | Status: DC | PRN

## 2022-08-27 MED ORDER — PERFLUTREN LIPID MICROSPHERE
1.0000 mL | INTRAVENOUS | Status: AC | PRN
  Administered 2022-08-27: 2 mL via INTRAVENOUS

## 2022-08-27 MED ORDER — TRAZODONE HCL 50 MG PO TABS: 50.0000 mg | ORAL_TABLET | Freq: Every evening | ORAL | Status: DC | PRN

## 2022-08-27 MED ORDER — METOPROLOL TARTRATE 5 MG/5ML IV SOLN: 5.0000 mg | INTRAVENOUS | Status: DC | PRN

## 2022-08-27 MED ORDER — HYDRALAZINE HCL 20 MG/ML IJ SOLN: 10.0000 mg | INTRAMUSCULAR | Status: DC | PRN

## 2022-08-27 MED ORDER — AMIODARONE HCL 200 MG PO TABS
200.0000 mg | ORAL_TABLET | Freq: Every day | ORAL | Status: DC
  Administered 2022-08-28: 200 mg via ORAL
  Filled 2022-08-27: qty 1

## 2022-08-27 NOTE — Progress Notes (Signed)
PROGRESS NOTE    Max Rivas  YQM:[id number] DOB: [birth year] DOA: 08/26/2022 PCP: [facility]   Brief Narrative:  72 year old with history of CAD status post MI with CABG, systolic CHF EF 35%, A-fib on Eliquis, chronic hypoxia on O2, COPD presents to the ED with worsening shortness of breath for the past 2 days.  He is admitted to the hospital with concerns of CHF exacerbation.  Chest x-ray showed cardiomegaly with central venous congestion/pulmonary edema.   Assessment & Plan:  Principal Problem:   Acute on chronic hypoxic respiratory failure (HCC) Active Problems:   Hypoxia    Acute hypoxic respiratory distress Acute congestive heart failure with reduced ejection fraction, 35%.  Class III -Echocardiogram in June 2024 showed EF of 35% with enlarged RV.  Received IV Lasix but due to rising creatinine, hold off Lasix at this time.  We will monitor his urine output, give him incentive spirometer and flutter valve. -Home medicine Toprol-XL, Jardiance, Imdur  Hypokalemia -As needed repletion  AKI on CKD stage II -Baseline creatinine 1.3, this morning is 1.8.  Closely monitor this  History of CAD status post CABG -Currently chest pain-free.  Continue aspirin, statin, Imdur, Toprol-XL  History of atrial fibrillation, paroxysmal -On amiodarone, Toprol-XL and Eliquis  COPD -Continue home bronchodilators.  Also added as needed.  I-S/flutter  GERD -Protonix  History of mood disorder - On amitriptyline  Hypothyroidism - Synthroid    DVT prophylaxis: SCDs Start: 08/26/22 2258 apixaban (ELIQUIS) tablet 5 mg  Code Status:  Family Communication:   Ongoing management for fluid overload    Diet Orders (From admission, onward)     Start     Ordered   08/26/22 2258  Diet heart healthy/carb modified Room service appropriate? Yes; Fluid consistency: Thin  Diet effective now       Question Answer Comment  Diet-HS Snack? Nothing   Room service appropriate? Yes    Fluid consistency: Thin      08/26/22 2258            Subjective: Seen at bedside, tells me his breathing is somewhat better this morning. Tells me [facility]   Examination:  General exam: Appears calm and comfortable  Respiratory system: Crackles at bilateral bases Cardiovascular system: S1 & S2 heard, RRR. No JVD, murmurs, rubs, gallops or clicks. No pedal edema. Gastrointestinal system: Abdomen is nondistended, soft and nontender. No organomegaly or masses felt. Normal bowel sounds heard. Central nervous system: Alert and oriented. No focal neurological deficits. Extremities: Symmetric 5 x 5 power. Skin: No rashes, lesions or ulcers Psychiatry: Judgement and insight appear normal. Mood & affect appropriate.  Objective: Vitals:   08/26/22 2330 08/27/22 0356 08/27/22 0437 08/27/22 0730  BP: (!) 126/104 104/70  96/64  Pulse: 67 70  63  Resp:  17    Temp: 97.6 F (36.4 C) 97.8 F (36.6 C)  (!) 97.5 F (36.4 C)  TempSrc: Oral Oral  Axillary  SpO2: (!) 88% 95%  95%  Weight:   98.2 kg   Height:        Intake/Output Summary (Last 24 hours) at 08/27/2022 0812 Last data filed at 08/27/2022 0559 Gross per 24 hour  Intake 203 ml  Output 500 ml  Net -297 ml   Filed Weights   08/26/22 1601 08/26/22 2328 08/27/22 0437  Weight: 106.6 kg 98.2 kg 98.2 kg    Scheduled Meds:  amiodarone  200 mg Oral BID   amitriptyline  25 mg  Oral QHS   apixaban  5 mg Oral BID   arformoterol  15 mcg Nebulization BID   And   umeclidinium bromide  1 puff Inhalation Daily   aspirin EC  81 mg Oral Daily   budesonide  0.5 mg Nebulization BID   cholecalciferol  2,000 Units Oral Daily   cyclobenzaprine  10 mg Oral QPC breakfast   empagliflozin  10 mg Oral Daily   furosemide  40 mg Intravenous Daily   gabapentin  200 mg Oral BID   isosorbide mononitrate  30 mg Oral Daily   levothyroxine  100 mcg Oral QAC breakfast   magnesium oxide  400 mg Oral Daily    metoprolol succinate  12.5 mg Oral Daily   montelukast  10 mg Oral QHS   multivitamin  1 tablet Oral Daily   multivitamin with minerals  1 tablet Oral Q breakfast   pantoprazole  40 mg Oral Daily   potassium chloride SA  20 mEq Oral BID   rosuvastatin  20 mg Oral QHS   sodium chloride flush  3 mL Intravenous Q12H   Continuous Infusions:  sodium chloride      Nutritional status     Body mass index is 30.19 kg/m.  Data Reviewed:   CBC: Recent Labs  Lab 08/26/22 1656  WBC 6.9  NEUTROABS 5.2  HGB 13.8  HCT 42.8  MCV 92.4  PLT 154   Basic Metabolic Panel: Recent Labs  Lab 08/26/22 1656 08/27/22 0056  NA 137 137  K 3.1* 3.5  CL 101 97*  CO2 25 29  GLUCOSE 96 127*  BUN 13 14  CREATININE 1.37* 1.83*  CALCIUM 7.8* 8.5*   GFR: Estimated Creatinine Clearance: 44.9 mL/min (A) (by C-G formula based on SCr of 1.83 mg/dL (H)). Liver Function Tests: Recent Labs  Lab 08/26/22 1656  AST 26  ALT 12  ALKPHOS 62  BILITOT 1.3*  PROT 6.2*  ALBUMIN 3.3*   No results for input(s): "LIPASE", "AMYLASE" in the last 168 hours. No results for input(s): "AMMONIA" in the last 168 hours. Coagulation Profile: No results for input(s): "INR", "PROTIME" in the last 168 hours. Cardiac Enzymes: No results for input(s): "CKTOTAL", "CKMB", "CKMBINDEX", "TROPONINI" in the last 168 hours. BNP (last 3 results) No results for input(s): "PROBNP" in the last 8760 hours. HbA1C: No results for input(s): "HGBA1C" in the last 72 hours. CBG: No results for input(s): "GLUCAP" in the last 168 hours. Lipid Profile: Recent Labs    08/27/22 0056  CHOL 115  HDL 36*  LDLCALC 61  TRIG 90  CHOLHDL 3.2   Thyroid Function Tests: Recent Labs    08/27/22 0056  TSH 3.915   Anemia Panel: No results for input(s): "VITAMINB12", "FOLATE", "FERRITIN", "TIBC", "IRON", "RETICCTPCT" in the last 72 hours. Sepsis Labs: No results for input(s): "PROCALCITON", "LATICACIDVEN" in the last 168  hours.  Recent Results (from the past 240 hour(s))  SARS Coronavirus 2 by RT PCR (hospital order, performed in [facility] hospital lab) *cepheid single result test* Anterior Nasal Swab     Status: None   Collection Time: 08/26/22  4:38 PM   Specimen: Anterior Nasal Swab  Result Value Ref Range Status   SARS Coronavirus 2 by RT PCR NEGATIVE NEGATIVE Final    Comment: (NOTE) SARS-CoV-2 target nucleic acids are NOT DETECTED.  The SARS-CoV-2 RNA is generally detectable in upper and lower respiratory specimens during the acute phase of infection. The lowest concentration of SARS-CoV-2 viral copies this assay can  detect is 250 copies / mL. A negative result does not preclude SARS-CoV-2 infection and should not be used as the sole basis for treatment or other patient management decisions.  A negative result may occur with improper specimen collection / handling, submission of specimen other than nasopharyngeal swab, presence of viral mutation(s) within the areas targeted by this assay, and inadequate number of viral copies (<250 copies / mL). A negative result must be combined with clinical observations, patient history, and epidemiological information.  Fact Sheet for Patients:   RoadLapTop.co.za  Fact Sheet for Healthcare Providers: http://kim-miller.com/  This test is not yet approved or  cleared by the [place] FDA and has been authorized for detection and/or diagnosis of SARS-CoV-2 by FDA under an Emergency Use Authorization (EUA).  This EUA will remain in effect (meaning this test can be used) for the duration of the COVID-19 declaration under Section 564(b)(1) of the Act, 21 U.S.C. section 360bbb-3(b)(1), unless the authorization is terminated or revoked sooner.  Performed at [facility], [address], [place], [place] [address]          Radiology Studies: DG Chest 2 View  Result Date:  08/26/2022 CLINICAL DATA:  Shortness of breath EXAM: CHEST - 2 VIEW COMPARISON:  CT 07/10/2022, chest x-ray 07/07/2022 FINDINGS: Left-sided pacing device as before. Sternotomy with atrial appendage clip. Extensive right-sided calcified pleural plaques and chronic pleural thickening. Possible small left effusion. Cardiomegaly with central vascular congestion. Heterogeneous ground-glass opacity in the mid to lower lungs. No pneumothorax. Emphysema. Old left clavicle deformity. IMPRESSION: 1. Cardiomegaly with central vascular congestion. Heterogeneous ground-glass opacity in the mid to lower lungs may reflect edema or pneumonia. 2. Chronic right pleural thickening with calcified pleural plaques. Electronically Signed   By: Jasmine Pang M.D.   On: 08/26/2022 16:35           LOS: 0 days   Time spent= 35 mins     Joline Maxcy, MD [facility]  If 7PM-7AM, please contact night-coverage  08/27/2022, 8:12 AM

## 2022-08-27 NOTE — Progress Notes (Signed)
SATURATION QUALIFICATIONS: (This note is used to comply with regulatory documentation for home oxygen)   Patient Saturations on 8 Liters of oxygen while Ambulating = 71%  He only walked 5 steps then felt SOB and requested back to bed.

## 2022-08-27 NOTE — Progress Notes (Signed)
  Echocardiogram 2D Echocardiogram has been performed.  Janalyn Harder 08/27/2022, 3:29 PM

## 2022-08-27 NOTE — TOC Initial Note (Signed)
Transition of Care [facility]) - Initial/Assessment Note    Patient Details  Name: Max Rivas MRN: [id number] Date of Birth: [birth year]  Transition of Care [facility]) CM/SW Contact:    Lanier Clam, RN Phone Number: 08/27/2022, 12:10 PM  Clinical Narrative:  Patient's d/c plan home. Has [employer] home 02-travel tankss-states they run out in for his requirement of 5L;has 6 empty tanks in his apt-he wants [employer] rep to call his c# [phone]-Brittany will call patient-they will monitor for new orders, & 02 sats,they can pull orders in Epic. May need travel tank brought to hospital @ d/c.Continue to monitor.Has own transport home.                Expected Discharge Plan: Home/Self Care Barriers to Discharge: Continued Medical Work up   Patient Goals and CMS Choice Patient states their goals for this hospitalization and ongoing recovery are:: Home CMS Medicare.gov Compare Post Acute Care list provided to:: Patient Choice offered to / list presented to : Patient [place] ownership interest in [facility].provided to:: Patient    Expected Discharge Plan and Services   Discharge Planning Services: CM Consult Post Acute Care Choice: Resumption of Svcs/PTA Provider [facility] 02.) Living arrangements for the past 2 months: Apartment                                      Prior Living Arrangements/Services Living arrangements for the past 2 months: Apartment Lives with:: Roommate Patient language and need for interpreter reviewed:: Yes Do you feel safe going back to the place where you live?: Yes      Need for Family Participation in Patient Care: Yes (Comment) Care giver support system in place?: Yes (comment) Current home services: DME ([employer]-home 02 travel tank;will need tank brought to hospital;rw,handicapped toilet.) Criminal Activity/Legal Involvement Pertinent to Current Situation/Hospitalization: No - Comment as needed  Activities of Daily Living Home  Assistive Devices/Equipment: Eyeglasses, Oxygen, Walker (specify type) (walker when visiting Dr's office; mostly don't use walker per pt) ADL Screening (condition at time of admission) Patient's cognitive ability adequate to safely complete daily activities?: Yes Is the patient deaf or have difficulty hearing?: No Does the patient have difficulty seeing, even when wearing glasses/contacts?: No Does the patient have difficulty concentrating, remembering, or making decisions?: No Patient able to express need for assistance with ADLs?: Yes Does the patient have difficulty dressing or bathing?: No Independently performs ADLs?: Yes (appropriate for developmental age) Communication: Independent Dressing (OT): Independent Grooming: Independent Feeding: Independent Bathing: Independent Toileting: Independent In/Out Bed: Independent Walks in Home: Independent Does the patient have difficulty walking or climbing stairs?: No Weakness of Legs: None Weakness of Arms/Hands: None  Permission Sought/Granted Permission sought to share information with : Case Manager Permission granted to share information with : Yes, Verbal Permission Granted  Share Information with NAME: Case manager           Emotional Assessment Appearance:: Appears stated age Attitude/Demeanor/Rapport: Gracious Affect (typically observed): Accepting Orientation: : Oriented to Self, Oriented to Place, Oriented to  Time, Oriented to Situation Alcohol / Substance Use: Not Applicable Psych Involvement: No (comment)  Admission diagnosis:  Hypoxia [R09.02] Acute on chronic congestive heart failure, unspecified heart failure type (HCC) [I50.9] Acute on chronic hypoxic respiratory failure (HCC) [J96.21] Patient Active Problem List   Diagnosis Date Noted   Acute on chronic hypoxic respiratory failure (HCC) 08/26/2022  Acute on chronic respiratory failure with hypoxia (HCC) 07/07/2022   Essential hypertension 07/07/2022   DNR  (do not resuscitate) 07/07/2022   PAF (paroxysmal atrial fibrillation) (HCC) 07/07/2022   TIA (transient ischemic attack) 06/24/2021   Hypokalemia 06/24/2021   Sepsis (HCC) 04/25/2021   Ventricular tachyarrhythmia (HCC) 12/07/2019   Cardiomyopathy, ischemic    Vomiting    Acute respiratory failure with hypoxia (HCC) 09/18/2019   Acute on chronic heart failure (HCC) 09/08/2019   Unstable angina (HCC) 05/21/2019   Ventricular tachycardia (HCC) 05/12/2019   Chest pain    VT (ventricular tachycardia) (HCC) 05/11/2019   Arrhythmia 03/12/2019   On home O2    Chronic HFrEF (heart failure with reduced ejection fraction) (HCC) 06/22/2018   Chronic respiratory failure with hypoxia (HCC) 06/22/2018   Congestive heart failure (CHF) (HCC) 06/22/2018   DCM (dilated cardiomyopathy) (HCC) 05/25/2017   Coronary artery disease 05/25/2017   Chronic atrial fibrillation 05/25/2017   COPD (chronic obstructive pulmonary disease) (HCC) 05/25/2017   CKD (chronic kidney disease) 05/25/2017   Hypothyroidism 05/25/2017   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/2017   OSA (obstructive sleep apnea) 05/25/2017   Pulmonary HTN (HCC) 05/25/2017   Mediastinal adenopathy    Cervical adenopathy    Acute respiratory failure with hypoxia and hypercarbia (HCC) 02/24/2016   Pulmonary embolus (HCC) 02/24/2016   Pulmonary artery thrombosis (HCC)    Pleural effusion    Hypoxia    Lung mass    Pleural plaque    Flutter-fibrillation (HCC) 01/15/2016   Acute systolic congestive heart failure (HCC)    Atrial fibrillation with RVR (HCC)    Hypercholesterolemia 03/11/2006   Tobacco abuse 03/11/2006   Acute on chronic systolic congestive heart failure (HCC) 03/11/2006   GASTROESOPHAGEAL REFLUX, NO ESOPHAGITIS 03/11/2006   OSTEOARTHRITIS, MULTI SITES 03/11/2006   HIGH RISK PATIENT 03/11/2006   Tobacco dependence 03/11/2006   PCP:  Clinic, [facility] Pharmacy:   [id number] [place], [place] - [address] [place] [address] Phone: [phone] Fax: [phone]  [facility] - [place], [place] - [address] [place] [address] Phone: [phone] Fax: [phone]  [facility] [address] [place] [address] Phone: [phone] Fax: [phone]  [facility] - [place], [place] - [address] [place] [address] Phone: [phone] Fax: [phone]     Social Determinants of Health (SDOH) Social History: SDOH Screenings   Food Insecurity: No Food Insecurity (08/27/2022)  Housing: Low Risk  (08/27/2022)  Transportation Needs: No Transportation Needs (08/27/2022)  Utilities: Not At Risk (08/27/2022)  Alcohol Screen: Low Risk  (07/08/2022)  Financial Resource Strain: Low Risk  (07/08/2022)  Social Connections: Unknown (05/23/2021)   Received from [facility]  Tobacco Use: Medium Risk (08/27/2022)   SDOH Interventions:     Readmission Risk Interventions    06/25/2021    9:40 AM 04/28/2021   10:57 AM  Readmission Risk Prevention Plan  Transportation Screening Complete Complete  PCP or Specialist Appt within 3-5 Days Complete Complete  HRI or Home Care Consult Complete Complete  Social Work Consult for Recovery Care Planning/Counseling Complete Complete  Palliative Care Screening Not Applicable Not Applicable  Medication Review Oceanographer) Complete Complete

## 2022-08-27 NOTE — Care Management Obs Status (Signed)
MEDICARE OBSERVATION STATUS NOTIFICATION   Patient Details  Name: KUNIO BRACKENS MRN: [id number] Date of Birth: [birth year]   Medicare Observation Status Notification Given:  Yes    MahabirOlegario Messier, RN 08/27/2022, 12:28 PM

## 2022-08-27 NOTE — Plan of Care (Signed)
  Problem: Clinical Measurements: Goal: Respiratory complications will improve Outcome: Progressing   Problem: Activity: Goal: Risk for activity intolerance will decrease Outcome: Progressing   Problem: Safety: Goal: Ability to remain free from injury will improve Outcome: Progressing   Problem: Education: Goal: Knowledge of General Education information will improve Description: Including pain rating scale, medication(s)/side effects and non-pharmacologic comfort measures Outcome: Adequate for Discharge   Problem: Health Behavior/Discharge Planning: Goal: Ability to manage health-related needs will improve Outcome: Adequate for Discharge   Problem: Clinical Measurements: Goal: Ability to maintain clinical measurements within normal limits will improve Outcome: Adequate for Discharge Goal: Will remain free from infection Outcome: Adequate for Discharge Goal: Diagnostic test results will improve Outcome: Adequate for Discharge Goal: Cardiovascular complication will be avoided Outcome: Adequate for Discharge   Problem: Nutrition: Goal: Adequate nutrition will be maintained Outcome: Adequate for Discharge   Problem: Coping: Goal: Level of anxiety will decrease Outcome: Adequate for Discharge   Problem: Elimination: Goal: Will not experience complications related to bowel motility Outcome: Adequate for Discharge Goal: Will not experience complications related to urinary retention Outcome: Adequate for Discharge   Problem: Pain Managment: Goal: General experience of comfort will improve Outcome: Adequate for Discharge   Problem: Skin Integrity: Goal: Risk for impaired skin integrity will decrease Outcome: Adequate for Discharge

## 2022-08-27 NOTE — Plan of Care (Signed)
  Problem: Education: Goal: Knowledge of General Education information will improve Description: Including pain rating scale, medication(s)/side effects and non-pharmacologic comfort measures Outcome: Progressing   Problem: Clinical Measurements: Goal: Respiratory complications will improve Outcome: Not Progressing   Problem: Safety: Goal: Ability to remain free from injury will improve Outcome: Progressing

## 2022-08-27 NOTE — Progress Notes (Signed)
      Patient Name: Max Rivas           DOB: 09/07/1950  MRN: [id number]      Admission Date: 08/26/2022  Attending Provider: Miguel Rota, MD  Primary Diagnosis: Acute on chronic hypoxic respiratory failure (HCC)   Level of care: Telemetry   CODE STATUS Clarification   Notified by RN for request to clarify CODE STATUS.  Max Rivas is currently Full Code, and is requesting to be made DNR/DNI.  Patient is alert and oriented x4, capable of making medical decisions for himself. All questions and concerns regarding his care have been answered.    I have discussed with the patient at great length the difference between full code and DNR.  We also discussed the aggressive measures that occurred as a full code.  The patient understands that although aggressive measures may sustain life, his quality of life may not be the same as before.    Based on our conversation, the patient has requested to be made DNR/DNI.  If the patient were to have cardiac arrest, the patient does not wish for medical staff to intervene with CPR, DC shock. Ok to use ACLS drugs, fluids, cardiac monitoring.  If the patient were to have respiratory distress or arrest, the patient does not wish for medical staff to intervene with mechanical ventilation. OK to use Bipap.    CPR -              No Defibrillation -  No Ventilator -       No BiPAP -            Yes Vasopressors - Yes     Anthoney Harada, DNP, ACNPC- AG [facility] [place]

## 2022-08-28 ENCOUNTER — Inpatient Hospital Stay (HOSPITAL_COMMUNITY): Payer: Medicare Other

## 2022-08-28 DIAGNOSIS — J9621 Acute and chronic respiratory failure with hypoxia: Secondary | ICD-10-CM | POA: Diagnosis not present

## 2022-08-28 LAB — BASIC METABOLIC PANEL
Anion gap: 11 (ref 5–15)
BUN: 16 mg/dL (ref 8–23)
CO2: 25 mmol/L (ref 22–32)
Calcium: 8.7 mg/dL — ABNORMAL LOW (ref 8.9–10.3)
Chloride: 97 mmol/L — ABNORMAL LOW (ref 98–111)
Creatinine, Ser: 1.71 mg/dL — ABNORMAL HIGH (ref 0.61–1.24)
GFR, Estimated: 43 mL/min — ABNORMAL LOW (ref >60)
Glucose, Bld: 97 mg/dL (ref 70–99)
Potassium: 3.9 mmol/L (ref 3.5–5.1)
Sodium: 133 mmol/L — ABNORMAL LOW (ref 135–145)

## 2022-08-28 LAB — CBC
HCT: 40.4 % (ref 39.0–52.0)
Hemoglobin: 12.7 g/dL — ABNORMAL LOW (ref 13.0–17.0)
MCH: 29.9 pg (ref 26.0–34.0)
MCHC: 31.4 g/dL (ref 30.0–36.0)
MCV: 95.1 fL (ref 80.0–100.0)
Platelets: 160 10*3/uL (ref 150–400)
RBC: 4.25 MIL/uL (ref 4.22–5.81)
RDW: 15.3 % (ref 11.5–15.5)
WBC: 6.5 10*3/uL (ref 4.0–10.5)
nRBC: 0 % (ref 0.0–0.2)

## 2022-08-28 LAB — MAGNESIUM: Magnesium: 2.6 mg/dL — ABNORMAL HIGH (ref 1.7–2.4)

## 2022-08-28 LAB — PHOSPHORUS: Phosphorus: 4.3 mg/dL (ref 2.5–4.6)

## 2022-08-28 MED ORDER — FUROSEMIDE 10 MG/ML IJ SOLN
40.0000 mg | Freq: Two times a day (BID) | INTRAMUSCULAR | Status: DC
  Administered 2022-08-28 – 2022-08-30 (×4): 40 mg via INTRAVENOUS
  Filled 2022-08-28 (×4): qty 4

## 2022-08-28 NOTE — Hospital Course (Addendum)
  Brief Narrative:  72 year old with history of CAD status post MI with CABG, systolic CHF EF 35%, A-fib on Eliquis, chronic hypoxia on O2, COPD presents to the ED with worsening shortness of breath for the past 2 days. He is admitted to the hospital with concerns of CHF exacerbation. Chest x-ray showed cardiomegaly with central venous congestion/pulmonary edema. Started on IV diuretics now changing to PO. 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

## 2022-08-28 NOTE — Plan of Care (Signed)
  Problem: Health Behavior/Discharge Planning: Goal: Ability to manage health-related needs will improve Outcome: Progressing   Problem: Clinical Measurements: Goal: Will remain free from infection Outcome: Progressing Goal: Diagnostic test results will improve Outcome: Progressing Goal: Respiratory complications will improve Outcome: Progressing   Problem: Safety: Goal: Ability to remain free from injury will improve Outcome: Progressing   Problem: Activity: Goal: Risk for activity intolerance will decrease Outcome: Not Progressing

## 2022-08-28 NOTE — Progress Notes (Signed)
PROGRESS NOTE    Max Rivas  ZOX:[id number] DOB: December 08, 1950 DOA: 08/26/2022 PCP: [facility]     Brief Narrative:  72 year old with history of CAD status post MI with CABG, systolic CHF EF 35%, A-fib on Eliquis, chronic hypoxia on O2, COPD presents to the ED with worsening shortness of breath for the past 2 days.  He is admitted to the hospital with concerns of CHF exacerbation.  Chest x-ray showed cardiomegaly with central venous congestion/pulmonary edema.  Started on IV diuretics.     Assessment & Plan:  Principal Problem:   Acute on chronic hypoxic respiratory failure (HCC) Active Problems:   Hypoxia     Acute hypoxic respiratory distress Acute congestive heart failure with reduced ejection fraction, 30%.  Class III -Worsening hypoxia this morning on 8 L nasal cannula.  Will repeat chest x-ray.  Echocardiogram shows EF of 30%.  Currently on home medicine of Imdur, Toprol-XL, Jardiance. - Will repeat chest x-ray this morning, give Lasix 40 mg IV.   Hypokalemia -As needed repletion   AKI on CKD stage II -Baseline creatinine 1.3, peaked at 1.83, this morning 1.71   History of CAD status post CABG -Currently chest pain-free.  Continue aspirin, statin, Imdur, Toprol-XL   History of atrial fibrillation, paroxysmal -On amiodarone, Toprol-XL and Eliquis   COPD -Continue home bronchodilators.  Also added as needed.  I-S/flutter   GERD -Protonix   History of mood disorder - On amitriptyline   Hypothyroidism - Synthroid       DVT prophylaxis: SCDs Start: 08/26/22 2258 apixaban (ELIQUIS) tablet 5 mg  Code Status:  Family Communication:   Ongoing management for fluid overload and respiratory distress               Diet Orders (From admission, onward)     Start     Ordered   08/27/22 0824  Diet heart healthy/carb modified Room service appropriate? Yes; Fluid consistency: Thin; Fluid restriction: 1800 mL Fluid  Diet effective now        Question Answer Comment  Diet-HS Snack? Nothing   Room service appropriate? Yes   Fluid consistency: Thin   Fluid restriction: 1800 mL Fluid      08/27/22 0824            Subjective: States he feels litlte better after lasix.  Confirms he wants to be DNR DNI   Examination:  General exam: Appears calm and comfortable  Respiratory system: some b/l rhonchi.  Cardiovascular system: S1 & S2 heard, RRR. No JVD, murmurs, rubs, gallops or clicks. No pedal edema. Gastrointestinal system: Abdomen is nondistended, soft and nontender. No organomegaly or masses felt. Normal bowel sounds heard. Central nervous system: Alert and oriented. No focal neurological deficits. Extremities: Symmetric 5 x 5 power. Skin: No rashes, lesions or ulcers Psychiatry: Judgement and insight appear normal. Mood & affect appropriate.  Objective: Vitals:   08/28/22 0750 08/28/22 0832 08/28/22 0836 08/28/22 1214  BP:  103/62  102/72  Pulse:  93 71 87  Resp:  20    Temp:  97.8 F (36.6 C)  (!) 97.5 F (36.4 C)  TempSrc:  Oral  Oral  SpO2: 96% (!) 87% (!) 88% 93%  Weight:      Height:        Intake/Output Summary (Last 24 hours) at 08/28/2022 1252 Last data filed at 08/28/2022 1215 Gross per 24 hour  Intake 430 ml  Output 1000 ml  Net -570 ml   [employer]  08/26/22 2328 08/27/22 0437 08/28/22 0500  Weight: 98.2 kg 98.2 kg 100.3 kg    Scheduled Meds:  amiodarone  200 mg Oral Daily   amitriptyline  25 mg Oral QHS   apixaban  5 mg Oral BID   arformoterol  15 mcg Nebulization BID   And   umeclidinium bromide  1 puff Inhalation Daily   aspirin EC  81 mg Oral Daily   budesonide  0.5 mg Nebulization BID   cholecalciferol  2,000 Units Oral Daily   cyclobenzaprine  10 mg Oral QPC breakfast   empagliflozin  10 mg Oral Daily   furosemide  40 mg Intravenous BID   gabapentin  200 mg Oral BID   isosorbide mononitrate  30 mg Oral Daily   levothyroxine  100 mcg Oral QAC breakfast   metoprolol  succinate  12.5 mg Oral Daily   montelukast  10 mg Oral QHS   multivitamin  1 tablet Oral Daily   multivitamin with minerals  1 tablet Oral Q breakfast   pantoprazole  40 mg Oral Daily   potassium chloride SA  20 mEq Oral BID   rosuvastatin  20 mg Oral QHS   sodium chloride flush  3 mL Intravenous Q12H   Continuous Infusions:  sodium chloride      Nutritional status     Body mass index is 30.84 kg/m.  Data Reviewed:   CBC: Recent Labs  Lab 08/26/22 1656 08/28/22 0437  WBC 6.9 6.5  NEUTROABS 5.2  --   HGB 13.8 12.7*  HCT 42.8 40.4  MCV 92.4 95.1  PLT 154 160   Basic Metabolic Panel: Recent Labs  Lab 08/26/22 1656 08/27/22 0056 08/28/22 0437  NA 137 137 133*  K 3.1* 3.5 3.9  CL 101 97* 97*  CO2 25 29 25   GLUCOSE 96 127* 97  BUN 13 14 16   CREATININE 1.37* 1.83* 1.71*  CALCIUM 7.8* 8.5* 8.7*  MG  --   --  2.6*  PHOS  --   --  4.3   GFR: Estimated Creatinine Clearance: 48.5 mL/min (A) (by C-G formula based on SCr of 1.71 mg/dL (H)). Liver Function Tests: Recent Labs  Lab 08/26/22 1656  AST 26  ALT 12  ALKPHOS 62  BILITOT 1.3*  PROT 6.2*  ALBUMIN 3.3*   No results for input(s): "LIPASE", "AMYLASE" in the last 168 hours. No results for input(s): "AMMONIA" in the last 168 hours. Coagulation Profile: No results for input(s): "INR", "PROTIME" in the last 168 hours. Cardiac Enzymes: No results for input(s): "CKTOTAL", "CKMB", "CKMBINDEX", "TROPONINI" in the last 168 hours. BNP (last 3 results) No results for input(s): "PROBNP" in the last 8760 hours. HbA1C: No results for input(s): "HGBA1C" in the last 72 hours. CBG: No results for input(s): "GLUCAP" in the last 168 hours. Lipid Profile: Recent Labs    08/27/22 0056  CHOL 115  HDL 36*  LDLCALC 61  TRIG 90  CHOLHDL 3.2   Thyroid Function Tests: Recent Labs    08/27/22 0056  TSH 3.915   Anemia Panel: No results for input(s): "VITAMINB12", "FOLATE", "FERRITIN", "TIBC", "IRON", "RETICCTPCT"  in the last 72 hours. Sepsis Labs: No results for input(s): "PROCALCITON", "LATICACIDVEN" in the last 168 hours.  Recent Results (from the past 240 hour(s))  SARS Coronavirus 2 by RT PCR (hospital order, performed in [facility] hospital lab) *cepheid single result test* Anterior Nasal Swab     Status: None   Collection Time: 08/26/22  4:38 PM   Specimen:  Anterior Nasal Swab  Result Value Ref Range Status   SARS Coronavirus 2 by RT PCR NEGATIVE NEGATIVE Final    Comment: (NOTE) SARS-CoV-2 target nucleic acids are NOT DETECTED.  The SARS-CoV-2 RNA is generally detectable in upper and lower respiratory specimens during the acute phase of infection. The lowest concentration of SARS-CoV-2 viral copies this assay can detect is 250 copies / mL. A negative result does not preclude SARS-CoV-2 infection and should not be used as the sole basis for treatment or other patient management decisions.  A negative result may occur with improper specimen collection / handling, submission of specimen other than nasopharyngeal swab, presence of viral mutation(s) within the areas targeted by this assay, and inadequate number of viral copies (<250 copies / mL). A negative result must be combined with clinical observations, patient history, and epidemiological information.  Fact Sheet for Patients:   RoadLapTop.co.za  Fact Sheet for Healthcare Providers: http://kim-miller.com/  This test is not yet approved or  cleared by the [place] FDA and has been authorized for detection and/or diagnosis of SARS-CoV-2 by FDA under an Emergency Use Authorization (EUA).  This EUA will remain in effect (meaning this test can be used) for the duration of the COVID-19 declaration under Section 564(b)(1) of the Act, 21 U.S.C. section 360bbb-3(b)(1), unless the authorization is terminated or revoked sooner.  Performed at [facility], [address] [place], [place] [address]          Radiology Studies: ECHOCARDIOGRAM COMPLETE  Result Date: 08/27/2022    ECHOCARDIOGRAM REPORT   Patient Name:   Max Rivas Date of Exam: 08/27/2022 Medical Rec #:  [id number]      Height:       71.0 in Accession #:    [id number]     Weight:       216.5 lb Date of Birth:  09/28/1950      BSA:          2.181 m Patient Age:    72 years       BP:           105/80 mmHg Patient Gender: M              HR:           74 bpm. Exam Location:  Inpatient Procedure: 2D Echo, Cardiac Doppler, Color Doppler and Intracardiac            Opacification Agent Indications:    I50.40* Unspecified combined systolic (congestive) and diastolic                 (congestive) heart failure  History:        Patient has prior history of Echocardiogram examinations, most                 recent 06/18/2022. Cardiomyopathy and CHF, Abnormal ECG and                 Defibrillator, Pulmonary HTN and TIA, Arrythmias:Atrial                 Fibrillation and VT, Signs/Symptoms:Shortness of Breath, Dyspnea                 and Chest Pain; Risk Factors:Hypertension, Diabetes,                 Dyslipidemia and Current Smoker.  Sonographer:    Sheralyn Boatman RDCS Referring Phys: [id number] ALEXIS HUGELMEYER  Sonographer Comments: Technically difficult study due  to poor echo windows. Image acquisition challenging due to patient body habitus. IMPRESSIONS  1. Left ventricular ejection fraction, by estimation, is 25 to 30%. The left ventricle has severely decreased function. The left ventricle demonstrates global hypokinesis. The left ventricular internal cavity size was severely dilated. Left ventricular diastolic parameters are indeterminate.  2. Right ventricular systolic function is severely reduced. The right ventricular size is severely enlarged. There is moderately elevated pulmonary artery systolic pressure. The estimated right ventricular systolic pressure is 56.7 mmHg.  3. Left atrial size was moderately dilated.  4.  Right atrial size was moderately dilated.  5. The mitral valve is degenerative. Mild to moderate mitral valve regurgitation. No evidence of mitral stenosis.  6. The aortic valve is tricuspid. Aortic valve regurgitation is not visualized. No aortic stenosis is present.  7. The inferior vena cava is dilated in size with <50% respiratory variability, suggesting right atrial pressure of 15 mmHg. FINDINGS  Left Ventricle: Left ventricular ejection fraction, by estimation, is 25 to 30%. The left ventricle has severely decreased function. The left ventricle demonstrates global hypokinesis. Definity contrast agent was given IV to delineate the left ventricular endocardial borders. The left ventricular internal cavity size was severely dilated. There is no left ventricular hypertrophy. Left ventricular diastolic parameters are indeterminate. Right Ventricle: The right ventricular size is severely enlarged. Right vetricular wall thickness was not well visualized. Right ventricular systolic function is severely reduced. There is moderately elevated pulmonary artery systolic pressure. The tricuspid regurgitant velocity is 3.23 m/s, and with an assumed right atrial pressure of 15 mmHg, the estimated right ventricular systolic pressure is 56.7 mmHg. Left Atrium: Left atrial size was moderately dilated. Right Atrium: Right atrial size was moderately dilated. Pericardium: There is no evidence of pericardial effusion. Presence of epicardial fat layer. Mitral Valve: The mitral valve is degenerative in appearance. Mild to moderate mitral valve regurgitation. No evidence of mitral valve stenosis. MV peak gradient, 71.2 mmHg. The mean mitral valve gradient is 40.0 mmHg. Tricuspid Valve: The tricuspid valve is grossly normal. Tricuspid valve regurgitation is mild . No evidence of tricuspid stenosis. Aortic Valve: The aortic valve is tricuspid. Aortic valve regurgitation is not visualized. No aortic stenosis is present. Pulmonic Valve:  The pulmonic valve was not well visualized. Pulmonic valve regurgitation is mild. No evidence of pulmonic stenosis. Aorta: The aortic root and ascending aorta are structurally normal, with no evidence of dilitation. Venous: The inferior vena cava is dilated in size with less than 50% respiratory variability, suggesting right atrial pressure of 15 mmHg. IAS/Shunts: No atrial level shunt detected by color flow Doppler. Additional Comments: A device lead is visualized.  LEFT VENTRICLE PLAX 2D LVIDd:         5.40 cm LVIDs:         4.70 cm LV PW:         1.40 cm LV IVS:        0.90 cm LVOT diam:     2.40 cm LV SV:         74 LV SV Index:   34 LVOT Area:     4.52 cm  LV Volumes (MOD) LV vol d, MOD A2C: 291.0 ml LV vol d, MOD A4C: 226.0 ml LV vol s, MOD A2C: 204.0 ml LV vol s, MOD A4C: 163.0 ml LV SV MOD A2C:     87.0 ml LV SV MOD A4C:     226.0 ml LV SV MOD BP:      76.2 ml RIGHT  VENTRICLE            IVC RV S prime:     4.53 cm/s  IVC diam: 2.90 cm TAPSE (M-mode): 0.6 cm LEFT ATRIUM              Index        RIGHT ATRIUM           Index LA diam:        4.60 cm  2.11 cm/m   RA Area:     26.30 cm LA Vol (A2C):   101.0 ml 46.31 ml/m  RA Volume:   96.60 ml  44.30 ml/m LA Vol (A4C):   112.0 ml 51.36 ml/m LA Biplane Vol: 107.0 ml 49.07 ml/m  AORTIC VALVE LVOT Vmax:   104.00 cm/s LVOT Vmean:  64.300 cm/s LVOT VTI:    0.164 m  AORTA Ao Root diam: 3.20 cm Ao Asc diam:  3.50 cm MITRAL VALVE                  TRICUSPID VALVE MV Area (PHT): 5.02 cm       TR Peak grad:   41.7 mmHg MV Area VTI:   0.61 cm       TR Vmax:        323.00 cm/s MV Peak grad:  71.2 mmHg MV Mean grad:  40.0 mmHg      SHUNTS MV Vmax:       4.22 m/s       Systemic VTI:  0.16 m MV Vmean:      288.0 cm/s     Systemic Diam: 2.40 cm MV Decel Time: 151 msec MR Peak grad:    75.7 mmHg MR Mean grad:    49.0 mmHg MR Vmax:         435.00 cm/s MR Vmean:        330.0 cm/s MR PISA:         2.26 cm MR PISA Eff ROA: 18 mm MR PISA Radius:  0.60 cm MV E velocity:  121.00 cm/s Arvilla Meres MD Electronically signed by Arvilla Meres MD Signature Date/Time: 08/27/2022/4:55:44 PM    Final    DG Chest 2 View  Result Date: 08/26/2022 CLINICAL DATA:  Shortness of breath EXAM: CHEST - 2 VIEW COMPARISON:  CT 07/10/2022, chest x-ray 07/07/2022 FINDINGS: Left-sided pacing device as before. Sternotomy with atrial appendage clip. Extensive right-sided calcified pleural plaques and chronic pleural thickening. Possible small left effusion. Cardiomegaly with central vascular congestion. Heterogeneous ground-glass opacity in the mid to lower lungs. No pneumothorax. Emphysema. Old left clavicle deformity. IMPRESSION: 1. Cardiomegaly with central vascular congestion. Heterogeneous ground-glass opacity in the mid to lower lungs may reflect edema or pneumonia. 2. Chronic right pleural thickening with calcified pleural plaques. Electronically Signed   By: Jasmine Pang M.D.   On: 08/26/2022 16:35           LOS: 1 day   Time spent= 35 mins    Miguel Rota, MD [facility]  If 7PM-7AM, please contact night-coverage  08/28/2022, 12:52 PM

## 2022-08-29 DIAGNOSIS — I5023 Acute on chronic systolic (congestive) heart failure: Secondary | ICD-10-CM | POA: Diagnosis not present

## 2022-08-29 DIAGNOSIS — J9621 Acute and chronic respiratory failure with hypoxia: Secondary | ICD-10-CM | POA: Diagnosis not present

## 2022-08-29 LAB — BASIC METABOLIC PANEL
Anion gap: 9 (ref 5–15)
BUN: 17 mg/dL (ref 8–23)
CO2: 28 mmol/L (ref 22–32)
Calcium: 8.9 mg/dL (ref 8.9–10.3)
Chloride: 101 mmol/L (ref 98–111)
Creatinine, Ser: 1.65 mg/dL — ABNORMAL HIGH (ref 0.61–1.24)
GFR, Estimated: 44 mL/min — ABNORMAL LOW (ref >60)
Glucose, Bld: 86 mg/dL (ref 70–99)
Potassium: 4.5 mmol/L (ref 3.5–5.1)
Sodium: 138 mmol/L (ref 135–145)

## 2022-08-29 LAB — LACTIC ACID, PLASMA: Lactic Acid, Venous: 1.4 mmol/L (ref 0.5–1.9)

## 2022-08-29 LAB — CBC
HCT: 41.7 % (ref 39.0–52.0)
Hemoglobin: 13.1 g/dL (ref 13.0–17.0)
MCH: 29.3 pg (ref 26.0–34.0)
MCHC: 31.4 g/dL (ref 30.0–36.0)
MCV: 93.3 fL (ref 80.0–100.0)
Platelets: 143 10*3/uL — ABNORMAL LOW (ref 150–400)
RBC: 4.47 MIL/uL (ref 4.22–5.81)
RDW: 15.3 % (ref 11.5–15.5)
WBC: 6.6 10*3/uL (ref 4.0–10.5)
nRBC: 0 % (ref 0.0–0.2)

## 2022-08-29 LAB — BRAIN NATRIURETIC PEPTIDE: B Natriuretic Peptide: 829.7 pg/mL — ABNORMAL HIGH (ref 0.0–100.0)

## 2022-08-29 LAB — MAGNESIUM: Magnesium: 2.6 mg/dL — ABNORMAL HIGH (ref 1.7–2.4)

## 2022-08-29 MED ORDER — AMIODARONE HCL 200 MG PO TABS
400.0000 mg | ORAL_TABLET | Freq: Two times a day (BID) | ORAL | Status: DC
  Administered 2022-08-29 – 2022-08-30 (×3): 400 mg via ORAL
  Filled 2022-08-29 (×3): qty 2

## 2022-08-29 NOTE — Progress Notes (Signed)
PROGRESS NOTE    Max Rivas  YQI:[id number] DOB: 1950/12/20 DOA: 08/26/2022 PCP: [facility]     Brief Narrative:  72 year old with history of CAD status post MI with CABG, systolic CHF EF 35%, A-fib on Eliquis, chronic hypoxia on O2, COPD presents to the ED with worsening shortness of breath for the past 2 days.  He is admitted to the hospital with concerns of CHF exacerbation.  Chest x-ray showed cardiomegaly with central venous congestion/pulmonary edema.  Started on IV diuretics.     Assessment & Plan:  Principal Problem:   Acute on chronic hypoxic respiratory failure (HCC) Active Problems:   Hypoxia     Acute hypoxic respiratory distress Acute congestive heart failure with reduced ejection fraction, 30%.  Class III -Worsening hypoxia this morning on 6 L nasal cannula.  Will repeat chest x-ray.  Echocardiogram shows EF of 30%.  Currently on home medicine of Imdur, Toprol-XL, Jardiance.  BNP rising - Continue Lasix, if soft blood pressure we can hold off on Imdur -Cardiology consult appreciated   Hypokalemia -As needed repletion   AKI on CKD stage II -Baseline creatinine 1.3, peaked at 1.83, slowly improving, this morning 1.65   History of CAD status post CABG -Currently chest pain-free.  Continue aspirin, statin, Imdur, Toprol-XL, will adjust for soft blood pressure while he is getting diuretics   History of atrial fibrillation, paroxysmal -On amiodarone, Toprol-XL and Eliquis   COPD -Continue home bronchodilators.  Also added as needed.  I-S/flutter   GERD -Protonix   History of mood disorder - On amitriptyline   Hypothyroidism - Synthroid       DVT prophylaxis: SCDs Start: 08/26/22 2258 apixaban (ELIQUIS) tablet 5 mg  Code Status:  Family Communication:   Ongoing management for fluid overload and respiratory distress               Diet Orders (From admission, onward)     Start     Ordered   08/27/22 0824  Diet heart  healthy/carb modified Room service appropriate? Yes; Fluid consistency: Thin; Fluid restriction: 1800 mL Fluid  Diet effective now       Question Answer Comment  Diet-HS Snack? Nothing   Room service appropriate? Yes   Fluid consistency: Thin   Fluid restriction: 1800 mL Fluid      08/27/22 0824            Subjective: Feels a little better.  Sitting up in the recliner today.   Examination:  General exam: Appears calm and comfortable  Respiratory system: Some bibasilar crackles Cardiovascular system: S1 & S2 heard, RRR. No JVD, murmurs, rubs, gallops or clicks. No pedal edema. Gastrointestinal system: Abdomen is nondistended, soft and nontender. No organomegaly or masses felt. Normal bowel sounds heard. Central nervous system: Alert and oriented. No focal neurological deficits. Extremities: Symmetric 5 x 5 power. Skin: No rashes, lesions or ulcers Psychiatry: Judgement and insight appear normal. Mood & affect appropriate.  Objective: Vitals:   08/29/22 0500 08/29/22 0817 08/29/22 0819 08/29/22 1026  BP:    (!) 108/59  Pulse:   86 79  Resp:      Temp:      TempSrc:      SpO2:  93% 93%   Weight: 99.2 kg     Height:        Intake/Output Summary (Last 24 hours) at 08/29/2022 1157 Last data filed at 08/29/2022 1015 Gross per 24 hour  Intake 240 ml  Output 1750 ml  Net -  1510 ml   Filed Weights   08/27/22 0437 08/28/22 0500 08/29/22 0500  Weight: 98.2 kg 100.3 kg 99.2 kg    Scheduled Meds:  amiodarone  400 mg Oral BID   amitriptyline  25 mg Oral QHS   apixaban  5 mg Oral BID   arformoterol  15 mcg Nebulization BID   And   umeclidinium bromide  1 puff Inhalation Daily   aspirin EC  81 mg Oral Daily   budesonide  0.5 mg Nebulization BID   cholecalciferol  2,000 Units Oral Daily   cyclobenzaprine  10 mg Oral QPC breakfast   furosemide  40 mg Intravenous BID   gabapentin  200 mg Oral BID   isosorbide mononitrate  30 mg Oral Daily   levothyroxine  100 mcg Oral  QAC breakfast   metoprolol succinate  12.5 mg Oral Daily   montelukast  10 mg Oral QHS   multivitamin  1 tablet Oral Daily   multivitamin with minerals  1 tablet Oral Q breakfast   pantoprazole  40 mg Oral Daily   potassium chloride SA  20 mEq Oral BID   rosuvastatin  20 mg Oral QHS   sodium chloride flush  3 mL Intravenous Q12H   Continuous Infusions:  sodium chloride      Nutritional status     Body mass index is 30.5 kg/m.  Data Reviewed:   CBC: Recent Labs  Lab 08/26/22 1656 08/28/22 0437 08/29/22 0527  WBC 6.9 6.5 6.6  NEUTROABS 5.2  --   --   HGB 13.8 12.7* 13.1  HCT 42.8 40.4 41.7  MCV 92.4 95.1 93.3  PLT 154 160 143*   Basic Metabolic Panel: Recent Labs  Lab 08/26/22 1656 08/27/22 0056 08/28/22 0437 08/29/22 0527  NA 137 137 133* 138  K 3.1* 3.5 3.9 4.5  CL 101 97* 97* 101  CO2 25 29 25 28   GLUCOSE 96 127* 97 86  BUN 13 14 16 17   CREATININE 1.37* 1.83* 1.71* 1.65*  CALCIUM 7.8* 8.5* 8.7* 8.9  MG  --   --  2.6* 2.6*  PHOS  --   --  4.3  --    GFR: Estimated Creatinine Clearance: 50 mL/min (A) (by C-G formula based on SCr of 1.65 mg/dL (H)). Liver Function Tests: Recent Labs  Lab 08/26/22 1656  AST 26  ALT 12  ALKPHOS 62  BILITOT 1.3*  PROT 6.2*  ALBUMIN 3.3*   No results for input(s): "LIPASE", "AMYLASE" in the last 168 hours. No results for input(s): "AMMONIA" in the last 168 hours. Coagulation Profile: No results for input(s): "INR", "PROTIME" in the last 168 hours. Cardiac Enzymes: No results for input(s): "CKTOTAL", "CKMB", "CKMBINDEX", "TROPONINI" in the last 168 hours. BNP (last 3 results) No results for input(s): "PROBNP" in the last 8760 hours. HbA1C: No results for input(s): "HGBA1C" in the last 72 hours. CBG: No results for input(s): "GLUCAP" in the last 168 hours. Lipid Profile: Recent Labs    08/27/22 0056  CHOL 115  HDL 36*  LDLCALC 61  TRIG 90  CHOLHDL 3.2   Thyroid Function Tests: Recent Labs     08/27/22 0056  TSH 3.915   Anemia Panel: No results for input(s): "VITAMINB12", "FOLATE", "FERRITIN", "TIBC", "IRON", "RETICCTPCT" in the last 72 hours. Sepsis Labs: Recent Labs  Lab 08/29/22 0925  LATICACIDVEN 1.4    Recent Results (from the past 240 hour(s))  SARS Coronavirus 2 by RT PCR (hospital order, performed in [facility] hospital lab) *  cepheid single result test* Anterior Nasal Swab     Status: None   Collection Time: 08/26/22  4:38 PM   Specimen: Anterior Nasal Swab  Result Value Ref Range Status   SARS Coronavirus 2 by RT PCR NEGATIVE NEGATIVE Final    Comment: (NOTE) SARS-CoV-2 target nucleic acids are NOT DETECTED.  The SARS-CoV-2 RNA is generally detectable in upper and lower respiratory specimens during the acute phase of infection. The lowest concentration of SARS-CoV-2 viral copies this assay can detect is 250 copies / mL. A negative result does not preclude SARS-CoV-2 infection and should not be used as the sole basis for treatment or other patient management decisions.  A negative result may occur with improper specimen collection / handling, submission of specimen other than nasopharyngeal swab, presence of viral mutation(s) within the areas targeted by this assay, and inadequate number of viral copies (<250 copies / mL). A negative result must be combined with clinical observations, patient history, and epidemiological information.  Fact Sheet for Patients:   RoadLapTop.co.za  Fact Sheet for Healthcare Providers: http://kim-miller.com/  This test is not yet approved or  cleared by the [place] FDA and has been authorized for detection and/or diagnosis of SARS-CoV-2 by FDA under an Emergency Use Authorization (EUA).  This EUA will remain in effect (meaning this test can be used) for the duration of the COVID-19 declaration under Section 564(b)(1) of the Act, 21 U.S.C. section 360bbb-3(b)(1), unless the  authorization is terminated or revoked sooner.  Performed at [facility], [address], [place], [place] [address]          Radiology Studies: [facility] Chest Port 1 View  Result Date: 08/28/2022 CLINICAL DATA:  Dyspnea. EXAM: PORTABLE CHEST 1 VIEW COMPARISON:  Radiographs 08/26/2022.  CT 07/10/2022. FINDINGS: 0902 hours. Left subclavian AICD leads appear unchanged, projecting to the level of the right ventricular apex. The heart size and mediastinal contours are stable status post median sternotomy, CABG and left atrial appendage clipping. Chronic right-sided pleural thickening and calcifications are unchanged. Interval improvement in the aeration of the lungs with decreased patchy bibasilar opacities. No evidence of pneumothorax or significant pleural effusion. No acute osseous findings. IMPRESSION: Interval improved aeration of the lungs with decreased bibasilar opacities. No acute cardiopulmonary process. Electronically Signed   By: Carey Bullocks M.D.   On: 08/28/2022 12:53   ECHOCARDIOGRAM COMPLETE  Result Date: 08/27/2022    ECHOCARDIOGRAM REPORT   Patient Name:   WEBBER CURREN Date of Exam: 08/27/2022 Medical Rec #:  [id number]      Height:       71.0 in Accession #:    [id number]     Weight:       216.5 lb Date of Birth:  [birth year]      BSA:          2.181 m Patient Age:    72 years       BP:           105/80 mmHg Patient Gender: M              HR:           74 bpm. Exam Location:  Inpatient Procedure: 2D Echo, Cardiac Doppler, Color Doppler and Intracardiac            Opacification Agent Indications:    I50.40* Unspecified combined systolic (congestive) and diastolic                 (congestive) heart  failure  History:        Patient has prior history of Echocardiogram examinations, most                 recent 06/18/2022. Cardiomyopathy and CHF, Abnormal ECG and                 Defibrillator, Pulmonary HTN and TIA, Arrythmias:Atrial                 Fibrillation and VT,  Signs/Symptoms:Shortness of Breath, Dyspnea                 and Chest Pain; Risk Factors:Hypertension, Diabetes,                 Dyslipidemia and Current Smoker.  Sonographer:    Sheralyn Boatman RDCS Referring Phys: [id number] ALEXIS HUGELMEYER  Sonographer Comments: Technically difficult study due to poor echo windows. Image acquisition challenging due to patient body habitus. IMPRESSIONS  1. Left ventricular ejection fraction, by estimation, is 25 to 30%. The left ventricle has severely decreased function. The left ventricle demonstrates global hypokinesis. The left ventricular internal cavity size was severely dilated. Left ventricular diastolic parameters are indeterminate.  2. Right ventricular systolic function is severely reduced. The right ventricular size is severely enlarged. There is moderately elevated pulmonary artery systolic pressure. The estimated right ventricular systolic pressure is 56.7 mmHg.  3. Left atrial size was moderately dilated.  4. Right atrial size was moderately dilated.  5. The mitral valve is degenerative. Mild to moderate mitral valve regurgitation. No evidence of mitral stenosis.  6. The aortic valve is tricuspid. Aortic valve regurgitation is not visualized. No aortic stenosis is present.  7. The inferior vena cava is dilated in size with <50% respiratory variability, suggesting right atrial pressure of 15 mmHg. FINDINGS  Left Ventricle: Left ventricular ejection fraction, by estimation, is 25 to 30%. The left ventricle has severely decreased function. The left ventricle demonstrates global hypokinesis. Definity contrast agent was given IV to delineate the left ventricular endocardial borders. The left ventricular internal cavity size was severely dilated. There is no left ventricular hypertrophy. Left ventricular diastolic parameters are indeterminate. Right Ventricle: The right ventricular size is severely enlarged. Right vetricular wall thickness was not well visualized. Right  ventricular systolic function is severely reduced. There is moderately elevated pulmonary artery systolic pressure. The tricuspid regurgitant velocity is 3.23 m/s, and with an assumed right atrial pressure of 15 mmHg, the estimated right ventricular systolic pressure is 56.7 mmHg. Left Atrium: Left atrial size was moderately dilated. Right Atrium: Right atrial size was moderately dilated. Pericardium: There is no evidence of pericardial effusion. Presence of epicardial fat layer. Mitral Valve: The mitral valve is degenerative in appearance. Mild to moderate mitral valve regurgitation. No evidence of mitral valve stenosis. MV peak gradient, 71.2 mmHg. The mean mitral valve gradient is 40.0 mmHg. Tricuspid Valve: The tricuspid valve is grossly normal. Tricuspid valve regurgitation is mild . No evidence of tricuspid stenosis. Aortic Valve: The aortic valve is tricuspid. Aortic valve regurgitation is not visualized. No aortic stenosis is present. Pulmonic Valve: The pulmonic valve was not well visualized. Pulmonic valve regurgitation is mild. No evidence of pulmonic stenosis. Aorta: The aortic root and ascending aorta are structurally normal, with no evidence of dilitation. Venous: The inferior vena cava is dilated in size with less than 50% respiratory variability, suggesting right atrial pressure of 15 mmHg. IAS/Shunts: No atrial level shunt detected by color flow Doppler. Additional Comments: A device lead  is visualized.  LEFT VENTRICLE PLAX 2D LVIDd:         5.40 cm LVIDs:         4.70 cm LV PW:         1.40 cm LV IVS:        0.90 cm LVOT diam:     2.40 cm LV SV:         74 LV SV Index:   34 LVOT Area:     4.52 cm  LV Volumes (MOD) LV vol d, MOD A2C: 291.0 ml LV vol d, MOD A4C: 226.0 ml LV vol s, MOD A2C: 204.0 ml LV vol s, MOD A4C: 163.0 ml LV SV MOD A2C:     87.0 ml LV SV MOD A4C:     226.0 ml LV SV MOD BP:      76.2 ml RIGHT VENTRICLE            IVC RV S prime:     4.53 cm/s  IVC diam: 2.90 cm TAPSE (M-mode):  0.6 cm LEFT ATRIUM              Index        RIGHT ATRIUM           Index LA diam:        4.60 cm  2.11 cm/m   RA Area:     26.30 cm LA Vol (A2C):   101.0 ml 46.31 ml/m  RA Volume:   96.60 ml  44.30 ml/m LA Vol (A4C):   112.0 ml 51.36 ml/m LA Biplane Vol: 107.0 ml 49.07 ml/m  AORTIC VALVE LVOT Vmax:   104.00 cm/s LVOT Vmean:  64.300 cm/s LVOT VTI:    0.164 m  AORTA Ao Root diam: 3.20 cm Ao Asc diam:  3.50 cm MITRAL VALVE                  TRICUSPID VALVE MV Area (PHT): 5.02 cm       TR Peak grad:   41.7 mmHg MV Area VTI:   0.61 cm       TR Vmax:        323.00 cm/s MV Peak grad:  71.2 mmHg MV Mean grad:  40.0 mmHg      SHUNTS MV Vmax:       4.22 m/s       Systemic VTI:  0.16 m MV Vmean:      288.0 cm/s     Systemic Diam: 2.40 cm MV Decel Time: 151 msec MR Peak grad:    75.7 mmHg MR Mean grad:    49.0 mmHg MR Vmax:         435.00 cm/s MR Vmean:        330.0 cm/s MR PISA:         2.26 cm MR PISA Eff ROA: 18 mm MR PISA Radius:  0.60 cm MV E velocity: 121.00 cm/s Arvilla Meres MD Electronically signed by Arvilla Meres MD Signature Date/Time: 08/27/2022/4:55:44 PM    Final            LOS: 2 days   Time spent= 35 mins    Miguel Rota, MD [facility]  If 7PM-7AM, please contact night-coverage  08/29/2022, 11:57 AM

## 2022-08-29 NOTE — Consult Note (Signed)
Cardiology Consultation   Patient ID: VY HAYS MRN: [id number]; DOB: 09-08-1950  Admit date: 08/26/2022 Date of Consult: 08/29/2022  PCP:  Clinic, [facility] Providers Cardiologist:  None  Electrophysiologist:  Lewayne Bunting, MD  {      Patient Profile:   Max Rivas is a 72 y.o. male with a hx of f CAD with multiple prior PCIs and CABG in 05/2019, left atrial appendage clipping, chronic HFrEF, COPD on home O2, VT with ICD and prior ablation 09/2019 at [facility], [place] on eliquis who is being seen 08/29/2022 for the evaluation of SOB at the request of Dr Nelson Chimes.  History of Present Illness:   Max Rivas 72 yo male history of CAD with multiple prior PCIs and CABG in 05/2019, left atrial appendage clipping, chronic HFrEF, COPD on home O2, VT with ICD and prior ablation 09/2019 at [facility], [place] on eliquis, admitted with SOB and 3 lbs weight gain at home. Reports compliance with meds  WBC 6.9 Hgb 13.8 Plt 154 K 3.1 BUN 13 Cr 1.37 BNP 684 LDL 61 TSH 3.9  Trop 16-->20-->20-->20 CXR central congestion, edema lower lungs vs infection 08/2022 echo: LVEF 25-30%, severe RV dysfunction, dilated fixed IVC  06/2022 echo: LVEF 30-35%, mod RV dysfunction  Past Medical History:  Diagnosis Date   Arthritis    "elbows, knees" (02/24/2016)   Bronchitis 2006   CAD (coronary artery disease)    a. s/p prior MIs - 1995 x 2, 1998; b. s/p prior LCX stenting; c. 04/2019 Cath: LM min irregs, LAD 65p/mi, D1 75, LCX 99ost/p, 6m (ISR), OM2 80, RCA/RPDA mod diff dzs; d. 05/2019 CABG x 3: LIMA->LAD, VG->Diag, VG->OM.   COPD (chronic obstructive pulmonary disease) (HCC)    a. Remote tobacco-->on home O2.   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 05/2019 Echo: EF 40-45%, gr2 DD. Nl RV size/fxn. Mildly dil LA. Mild MR.   High cholesterol    Ischemic cardiomyopathy    a. 05/2019 Echo: EF 40-45%.   Morbid obesity (HCC)     Myocardial infarction (HCC) ~ 1995 X 2;1998; 2000; 2004   On home oxygen therapy    "2L w/activity" (02/24/2016)   PAF (paroxysmal atrial fibrillation) (HCC)    a. CHA2DS2VASc = 4-->eliquis. Also on amio.   Pulmonary embolism (HCC) 02/24/2016   Sleep apnea    "have CPAP; can't tolerate it" (02/24/2016)   TIA (transient ischemic attack) 06/2021   Ventricular tachycardia (HCC)    a. 2005 s/p ICD; b. 03/2011 Device upgrade/lead exchange: MDT Protecta XT VR single lead AICD.    Past Surgical History:  Procedure Laterality Date   CARDIOVERSION N/A 09/11/2019   Procedure: CARDIOVERSION;  Surgeon: Jodelle Red, MD;  Location: [facility] ENDOSCOPY;  Service: Cardiovascular;  Laterality: N/A;   CLIPPING OF ATRIAL APPENDAGE N/A 05/23/2019   Procedure: CLIPPING OF ATRIAL APPENDAGE with atriclip;  Surgeon: [name], MD;  Location: [facility] OR;  Service: Open Heart Surgery;  Laterality: N/A;   CORONARY ANGIOPLASTY  1995   CORONARY ANGIOPLASTY WITH STENT PLACEMENT  ~ 1995 - 2004 X 5   "I've got a total of 5 stents" (02/24/2016)   CORONARY ARTERY BYPASS GRAFT N/A 05/23/2019   Procedure: CORONARY ARTERY BYPASS GRAFTING (CABG), times three, using right greater saphenous vein and left internal mammary;  Surgeon: [name], MD;  Location: [facility] OR;  Service: Open Heart Surgery;  Laterality:  N/AErnestine Rivas only   CORONARY PRESSURE/FFR STUDY N/A 05/12/2019   Procedure: INTRAVASCULAR PRESSURE WIRE/FFR STUDY;  Surgeon: Lyn Records, MD;  Location: [facility] INVASIVE CV LAB;  Service: Cardiovascular;  Laterality: N/A;   heart ablation  2022   INSERT / REPLACE / REMOVE PACEMAKER  07/2003   original PPM around 2004 for ICM with EF < 35%   LEFT HEART CATH AND CORONARY ANGIOGRAPHY N/A 05/12/2019   Procedure: LEFT HEART CATH AND CORONARY ANGIOGRAPHY;  Surgeon: Lyn Records, MD;  Location: [facility] INVASIVE CV LAB;  Service: Cardiovascular;  Laterality: N/A;   PACEMAKER GENERATOR CHANGE  03/2011    [facility]   TEE  WITHOUT CARDIOVERSION N/A 05/23/2019   Procedure: TRANSESOPHAGEAL ECHOCARDIOGRAM (TEE);  Surgeon: [name], MD;  Location: [facility] OR;  Service: Open Heart Surgery;  Laterality: N/A;   TONSILLECTOMY         Inpatient Medications: Scheduled Meds:  amiodarone  200 mg Oral Daily   amitriptyline  25 mg Oral QHS   apixaban  5 mg Oral BID   arformoterol  15 mcg Nebulization BID   And   umeclidinium bromide  1 puff Inhalation Daily   aspirin EC  81 mg Oral Daily   budesonide  0.5 mg Nebulization BID   cholecalciferol  2,000 Units Oral Daily   cyclobenzaprine  10 mg Oral QPC breakfast   empagliflozin  10 mg Oral Daily   furosemide  40 mg Intravenous BID   gabapentin  200 mg Oral BID   isosorbide mononitrate  30 mg Oral Daily   levothyroxine  100 mcg Oral QAC breakfast   metoprolol succinate  12.5 mg Oral Daily   montelukast  10 mg Oral QHS   multivitamin  1 tablet Oral Daily   multivitamin with minerals  1 tablet Oral Q breakfast   pantoprazole  40 mg Oral Daily   potassium chloride SA  20 mEq Oral BID   rosuvastatin  20 mg Oral QHS   sodium chloride flush  3 mL Intravenous Q12H   Continuous Infusions:  sodium chloride     PRN Meds: sodium chloride, acetaminophen, hydrALAZINE, levalbuterol, metoprolol tartrate, nitroGLYCERIN, ondansetron (ZOFRAN) IV, mouth rinse, senna-docusate, sodium chloride flush, traZODone  Allergies:    Allergies  Allergen Reactions   Crestor [Rosuvastatin Calcium] Other (See Comments)    Back spasms, initially, but can tolerate it at a low dose now, in 2022 Tolerating 20mg  QD   Lipitor [Atorvastatin] Other (See Comments)    Spasticity   Ventolin [Albuterol] Palpitations and Other (See Comments)    Irregular heartbeat    Social History:   Social History   Socioeconomic History   Marital status: Divorced    Spouse name: Not on file   Number of children: 1   Years of education: Not on file   Highest education level: Not on file  Occupational  History   Occupation: Retired  Tobacco Use   Smoking status: Former    Current packs/day: 0.00    Average packs/day: 3.0 packs/day for 35.0 years (105.0 ttl pk-yrs)    Types: Cigarettes    Start date: 05/21/1984    Quit date: 05/22/2019    Years since quitting: 3.2   Smokeless tobacco: Former    Quit date: 05/21/2017  Vaping Use   Vaping status: Never Used  Substance and Sexual Activity   Alcohol use: Not Currently   Drug use: Not Currently    Types: Cocaine    Comment: none since 1995  Sexual activity: Not Currently  Other Topics Concern   Not on file  Social History Narrative   Lives with room mate   Social Determinants of Health   Financial Resource Strain: Low Risk  (07/08/2022)   Overall Financial Resource Strain (CARDIA)    Difficulty of Paying Living Expenses: Not hard at all  Food Insecurity: No Food Insecurity (08/27/2022)   Hunger Vital Sign    Worried About Running Out of Food in the Last Year: Never true    Ran Out of Food in the Last Year: Never true  Transportation Needs: No Transportation Needs (08/27/2022)   PRAPARE - Administrator, Civil Service (Medical): No    Lack of Transportation (Non-Medical): No  Physical Activity: Not on file  Stress: Not on file  Social Connections: Unknown (05/23/2021)   Received from [facility]   Social Network    Social Network: Not on file  Intimate Partner Violence: Not At Risk (08/27/2022)   Humiliation, Afraid, Rape, and Kick questionnaire    Fear of Current or Ex-Partner: No    Emotionally Abused: No    Physically Abused: No    Sexually Abused: No    Family History:    Family History  Problem Relation Age of Onset   Heart attack Mother    Heart attack Father    Heart attack Sister 33   Heart attack Brother 48     ROS:  Please see the history of present illness.   All other ROS reviewed and negative.     Physical Exam/Data:   Vitals:   08/29/22 0414 08/29/22 0500 08/29/22 0817 08/29/22 0819   BP: 107/69     Pulse: 60   86  Resp: (!) 21     Temp: 97.9 F (36.6 C)     TempSrc: Oral     SpO2: 93%  93% 93%  Weight:  99.2 kg    Height:        Intake/Output Summary (Last 24 hours) at 08/29/2022 0850 Last data filed at 08/29/2022 0837 Gross per 24 hour  Intake 240 ml  Output 1200 ml  Net -960 ml      08/29/2022    5:00 AM 08/28/2022    5:00 AM 08/27/2022    4:37 AM  Last 3 Weights  Weight (lbs) 218 lb 11.2 oz 221 lb 1.9 oz 216 lb 7.9 oz  Weight (kg) 99.202 kg 100.3 kg 98.2 kg     Body mass index is 30.5 kg/m.  General:  Well nourished, well developed, in no acute distress HEENT: normal Neck: +JVD Vascular: No carotid bruits; Distal pulses 2+ bilaterally Cardiac:  normal S1, S2; RRR; no murmur  Lungs:  faint crackles bilateral bases Abd: soft, nontender, no hepatomegaly  Ext: no edema Musculoskeletal:  No deformities, BUE and BLE strength normal and equal Skin: warm and dry  Neuro:  CNs 2-12 intact, no focal abnormalities noted Psych:  Normal affect     Laboratory Data:  High Sensitivity Troponin:   Recent Labs  Lab 08/26/22 1656 08/26/22 1930 08/27/22 0056 08/27/22 0450  TROPONINIHS 16 20* 20* 20*     Chemistry Recent Labs  Lab 08/27/22 0056 08/28/22 0437 08/29/22 0527  NA 137 133* 138  K 3.5 3.9 4.5  CL 97* 97* 101  CO2 29 25 28   GLUCOSE 127* 97 86  BUN 14 16 17   CREATININE 1.83* 1.71* 1.65*  CALCIUM 8.5* 8.7* 8.9  MG  --  2.6* 2.6*  GFRNONAA  39* 43* 44*  ANIONGAP 11 11 9     Recent Labs  Lab 08/26/22 1656  PROT 6.2*  ALBUMIN 3.3*  AST 26  ALT 12  ALKPHOS 62  BILITOT 1.3*   Lipids  Recent Labs  Lab 08/27/22 0056  CHOL 115  TRIG 90  HDL 36*  LDLCALC 61  CHOLHDL 3.2    Hematology Recent Labs  Lab 08/26/22 1656 08/28/22 0437 08/29/22 0527  WBC 6.9 6.5 6.6  RBC 4.63 4.25 4.47  HGB 13.8 12.7* 13.1  HCT 42.8 40.4 41.7  MCV 92.4 95.1 93.3  MCH 29.8 29.9 29.3  MCHC 32.2 31.4 31.4  RDW 15.4 15.3 15.3  PLT 154 160 143*    Thyroid  Recent Labs  Lab 08/27/22 0056  TSH 3.915    BNP Recent Labs  Lab 08/26/22 1659 08/29/22 0527  BNP 684.4* 829.7*    DDimer No results for input(s): "DDIMER" in the last 168 hours.   Radiology/Studies:  DG Chest Port 1 View  Result Date: 08/28/2022 CLINICAL DATA:  Dyspnea. EXAM: PORTABLE CHEST 1 VIEW COMPARISON:  Radiographs 08/26/2022.  CT 07/10/2022. FINDINGS: 0902 hours. Left subclavian AICD leads appear unchanged, projecting to the level of the right ventricular apex. The heart size and mediastinal contours are stable status post median sternotomy, CABG and left atrial appendage clipping. Chronic right-sided pleural thickening and calcifications are unchanged. Interval improvement in the aeration of the lungs with decreased patchy bibasilar opacities. No evidence of pneumothorax or significant pleural effusion. No acute osseous findings. IMPRESSION: Interval improved aeration of the lungs with decreased bibasilar opacities. No acute cardiopulmonary process. Electronically Signed   By: Carey Bullocks M.D.   On: 08/28/2022 12:53   ECHOCARDIOGRAM COMPLETE  Result Date: 08/27/2022    ECHOCARDIOGRAM REPORT   Patient Name:   UYLESS SAFRON Date of Exam: 08/27/2022 Medical Rec #:  [id number]      Height:       71.0 in Accession #:    [id number]     Weight:       216.5 lb Date of Birth:  September 23, 1950      BSA:          2.181 m Patient Age:    72 years       BP:           105/80 mmHg Patient Gender: M              HR:           74 bpm. Exam Location:  Inpatient Procedure: 2D Echo, Cardiac Doppler, Color Doppler and Intracardiac            Opacification Agent Indications:    I50.40* Unspecified combined systolic (congestive) and diastolic                 (congestive) heart failure  History:        Patient has prior history of Echocardiogram examinations, most                 recent 06/18/2022. Cardiomyopathy and CHF, Abnormal ECG and                 Defibrillator, Pulmonary HTN and TIA,  Arrythmias:Atrial                 Fibrillation and VT, Signs/Symptoms:Shortness of Breath, Dyspnea                 and Chest Pain; Risk Factors:Hypertension, Diabetes,  Dyslipidemia and Current Smoker.  Sonographer:    Sheralyn Boatman RDCS Referring Phys: [id number] ALEXIS HUGELMEYER  Sonographer Comments: Technically difficult study due to poor echo windows. Image acquisition challenging due to patient body habitus. IMPRESSIONS  1. Left ventricular ejection fraction, by estimation, is 25 to 30%. The left ventricle has severely decreased function. The left ventricle demonstrates global hypokinesis. The left ventricular internal cavity size was severely dilated. Left ventricular diastolic parameters are indeterminate.  2. Right ventricular systolic function is severely reduced. The right ventricular size is severely enlarged. There is moderately elevated pulmonary artery systolic pressure. The estimated right ventricular systolic pressure is 56.7 mmHg.  3. Left atrial size was moderately dilated.  4. Right atrial size was moderately dilated.  5. The mitral valve is degenerative. Mild to moderate mitral valve regurgitation. No evidence of mitral stenosis.  6. The aortic valve is tricuspid. Aortic valve regurgitation is not visualized. No aortic stenosis is present.  7. The inferior vena cava is dilated in size with <50% respiratory variability, suggesting right atrial pressure of 15 mmHg. FINDINGS  Left Ventricle: Left ventricular ejection fraction, by estimation, is 25 to 30%. The left ventricle has severely decreased function. The left ventricle demonstrates global hypokinesis. Definity contrast agent was given IV to delineate the left ventricular endocardial borders. The left ventricular internal cavity size was severely dilated. There is no left ventricular hypertrophy. Left ventricular diastolic parameters are indeterminate. Right Ventricle: The right ventricular size is severely enlarged. Right vetricular  wall thickness was not well visualized. Right ventricular systolic function is severely reduced. There is moderately elevated pulmonary artery systolic pressure. The tricuspid regurgitant velocity is 3.23 m/s, and with an assumed right atrial pressure of 15 mmHg, the estimated right ventricular systolic pressure is 56.7 mmHg. Left Atrium: Left atrial size was moderately dilated. Right Atrium: Right atrial size was moderately dilated. Pericardium: There is no evidence of pericardial effusion. Presence of epicardial fat layer. Mitral Valve: The mitral valve is degenerative in appearance. Mild to moderate mitral valve regurgitation. No evidence of mitral valve stenosis. MV peak gradient, 71.2 mmHg. The mean mitral valve gradient is 40.0 mmHg. Tricuspid Valve: The tricuspid valve is grossly normal. Tricuspid valve regurgitation is mild . No evidence of tricuspid stenosis. Aortic Valve: The aortic valve is tricuspid. Aortic valve regurgitation is not visualized. No aortic stenosis is present. Pulmonic Valve: The pulmonic valve was not well visualized. Pulmonic valve regurgitation is mild. No evidence of pulmonic stenosis. Aorta: The aortic root and ascending aorta are structurally normal, with no evidence of dilitation. Venous: The inferior vena cava is dilated in size with less than 50% respiratory variability, suggesting right atrial pressure of 15 mmHg. IAS/Shunts: No atrial level shunt detected by color flow Doppler. Additional Comments: A device lead is visualized.  LEFT VENTRICLE PLAX 2D LVIDd:         5.40 cm LVIDs:         4.70 cm LV PW:         1.40 cm LV IVS:        0.90 cm LVOT diam:     2.40 cm LV SV:         74 LV SV Index:   34 LVOT Area:     4.52 cm  LV Volumes (MOD) LV vol d, MOD A2C: 291.0 ml LV vol d, MOD A4C: 226.0 ml LV vol s, MOD A2C: 204.0 ml LV vol s, MOD A4C: 163.0 ml LV SV MOD A2C:     87.0  ml LV SV MOD A4C:     226.0 ml LV SV MOD BP:      76.2 ml RIGHT VENTRICLE            IVC RV S prime:      4.53 cm/s  IVC diam: 2.90 cm TAPSE (M-mode): 0.6 cm LEFT ATRIUM              Index        RIGHT ATRIUM           Index LA diam:        4.60 cm  2.11 cm/m   RA Area:     26.30 cm LA Vol (A2C):   101.0 ml 46.31 ml/m  RA Volume:   96.60 ml  44.30 ml/m LA Vol (A4C):   112.0 ml 51.36 ml/m LA Biplane Vol: 107.0 ml 49.07 ml/m  AORTIC VALVE LVOT Vmax:   104.00 cm/s LVOT Vmean:  64.300 cm/s LVOT VTI:    0.164 m  AORTA Ao Root diam: 3.20 cm Ao Asc diam:  3.50 cm MITRAL VALVE                  TRICUSPID VALVE MV Area (PHT): 5.02 cm       TR Peak grad:   41.7 mmHg MV Area VTI:   0.61 cm       TR Vmax:        323.00 cm/s MV Peak grad:  71.2 mmHg MV Mean grad:  40.0 mmHg      SHUNTS MV Vmax:       4.22 m/s       Systemic VTI:  0.16 m MV Vmean:      288.0 cm/s     Systemic Diam: 2.40 cm MV Decel Time: 151 msec MR Peak grad:    75.7 mmHg MR Mean grad:    49.0 mmHg MR Vmax:         435.00 cm/s MR Vmean:        330.0 cm/s MR PISA:         2.26 cm MR PISA Eff ROA: 18 mm MR PISA Radius:  0.60 cm MV E velocity: 121.00 cm/s Arvilla Meres MD Electronically signed by Arvilla Meres MD Signature Date/Time: 08/27/2022/4:55:44 PM    Final    DG Chest 2 View  Result Date: 08/26/2022 CLINICAL DATA:  Shortness of breath EXAM: CHEST - 2 VIEW COMPARISON:  CT 07/10/2022, chest x-ray 07/07/2022 FINDINGS: Left-sided pacing device as before. Sternotomy with atrial appendage clip. Extensive right-sided calcified pleural plaques and chronic pleural thickening. Possible small left effusion. Cardiomegaly with central vascular congestion. Heterogeneous ground-glass opacity in the mid to lower lungs. No pneumothorax. Emphysema. Old left clavicle deformity. IMPRESSION: 1. Cardiomegaly with central vascular congestion. Heterogeneous ground-glass opacity in the mid to lower lungs may reflect edema or pneumonia. 2. Chronic right pleural thickening with calcified pleural plaques. Electronically Signed   By: Jasmine Pang M.D.   On: 08/26/2022  16:35     Assessment and Plan:   1.Acute on chronic HFrEF -08/2022 echo: LVEF 25-30%, severe RV dysfunction, dilated fixed IVC - BNP 684, CXR +edema  I/Os incomplete. Weights have labile pattern. Standing weight today 218 lbs. Received IV lasix 40mg  yesterday, due for bid dosing today. Initial elevation in Cr but within prior ranges and trending down. Accept higher Cr for now to diurese, if sustained uptrend would need to consider low output HF. Check lactic acid. Continue IV lasix today  -medical therapy  with jardiance 10mg , imdur 30, toprol 12.5. Hold jardiance as inpatient. Has not been on ACE/ARB/ARNI/MRA at home, review chart for more details. BP's soft and labile Cr, would not inititate at this time.    2.CAD - multiple prior PCIs and CABG in 05/2019 - no acute issues - continue medical therapy. Assume on ASA with eliquis given extensive CAD history, review records  3.History of VT - long history recurrent VT. Has AICD, prior ablation - some runs of NSVT. Increase oral amio to 400mg  bid x 7 days, then back to 200mg  daily.  - keep K at 4, Mg at 2  4.PAC - continue amio, toprol, eliquis  5. COPD - on 5L [place] at home.     For questions or updates, please contact [name] Please consult [email or web address] for contact info under    Signed, Dina Rich, MD  08/29/2022 8:50 AM

## 2022-08-30 DIAGNOSIS — I5023 Acute on chronic systolic (congestive) heart failure: Secondary | ICD-10-CM | POA: Diagnosis not present

## 2022-08-30 DIAGNOSIS — J9621 Acute and chronic respiratory failure with hypoxia: Secondary | ICD-10-CM | POA: Diagnosis not present

## 2022-08-30 LAB — CBC
HCT: 39 % (ref 39.0–52.0)
Hemoglobin: 12.2 g/dL — ABNORMAL LOW (ref 13.0–17.0)
MCH: 29.6 pg (ref 26.0–34.0)
MCHC: 31.3 g/dL (ref 30.0–36.0)
MCV: 94.7 fL (ref 80.0–100.0)
Platelets: 144 10*3/uL — ABNORMAL LOW (ref 150–400)
RBC: 4.12 MIL/uL — ABNORMAL LOW (ref 4.22–5.81)
RDW: 15.3 % (ref 11.5–15.5)
WBC: 6.3 10*3/uL (ref 4.0–10.5)
nRBC: 0 % (ref 0.0–0.2)

## 2022-08-30 LAB — BASIC METABOLIC PANEL
Anion gap: 10 (ref 5–15)
BUN: 20 mg/dL (ref 8–23)
CO2: 26 mmol/L (ref 22–32)
Calcium: 8.4 mg/dL — ABNORMAL LOW (ref 8.9–10.3)
Chloride: 100 mmol/L (ref 98–111)
Creatinine, Ser: 1.58 mg/dL — ABNORMAL HIGH (ref 0.61–1.24)
GFR, Estimated: 47 mL/min — ABNORMAL LOW (ref >60)
Glucose, Bld: 95 mg/dL (ref 70–99)
Potassium: 3.8 mmol/L (ref 3.5–5.1)
Sodium: 136 mmol/L (ref 135–145)

## 2022-08-30 LAB — MAGNESIUM: Magnesium: 2.4 mg/dL (ref 1.7–2.4)

## 2022-08-30 MED ORDER — AMIODARONE HCL 200 MG PO TABS
200.0000 mg | ORAL_TABLET | Freq: Every day | ORAL | Status: DC
  Administered 2022-09-05 – 2022-09-08 (×4): 200 mg via ORAL
  Filled 2022-08-30 (×4): qty 1

## 2022-08-30 MED ORDER — POTASSIUM CHLORIDE CRYS ER 20 MEQ PO TBCR
40.0000 meq | EXTENDED_RELEASE_TABLET | Freq: Once | ORAL | Status: AC
  Administered 2022-08-30: 40 meq via ORAL
  Filled 2022-08-30: qty 2

## 2022-08-30 MED ORDER — FUROSEMIDE 10 MG/ML IJ SOLN
60.0000 mg | Freq: Two times a day (BID) | INTRAMUSCULAR | Status: DC
  Administered 2022-08-30 – 2022-09-01 (×4): 60 mg via INTRAVENOUS
  Filled 2022-08-30 (×4): qty 6

## 2022-08-30 MED ORDER — FUROSEMIDE 10 MG/ML IJ SOLN
20.0000 mg | Freq: Once | INTRAMUSCULAR | Status: AC
  Administered 2022-08-30: 20 mg via INTRAVENOUS
  Filled 2022-08-30: qty 2

## 2022-08-30 MED ORDER — AMIODARONE HCL 200 MG PO TABS
400.0000 mg | ORAL_TABLET | Freq: Two times a day (BID) | ORAL | Status: AC
  Administered 2022-08-30 – 2022-09-04 (×11): 400 mg via ORAL
  Filled 2022-08-30 (×11): qty 2

## 2022-08-30 NOTE — Progress Notes (Signed)
PROGRESS NOTE    Max Rivas  GNF:[id number] DOB: 1950/08/18 DOA: 08/26/2022 PCP: [facility]     Brief Narrative:  72 year old with history of CAD status post MI with CABG, systolic CHF EF 35%, A-fib on Eliquis, chronic hypoxia on O2, COPD presents to the ED with worsening shortness of breath for the past 2 days.  He is admitted to the hospital with concerns of CHF exacerbation.  Chest x-ray showed cardiomegaly with central venous congestion/pulmonary edema.  Started on IV diuretics.     Assessment & Plan:  Principal Problem:   Acute on chronic hypoxic respiratory failure (HCC) Active Problems:   Hypoxia     Acute hypoxic respiratory distress Acute congestive heart failure with reduced ejection fraction, 30%.  Class III -Worsening hypoxia this morning on 6 L nasal cannula.  Will repeat chest x-ray.  Echocardiogram shows EF of 30%.  Currently on home medicine of Imdur, Toprol-XL.  BNP rising -Cont IV Lasix. Hold Jardiance for now.  -Cardiology following.    Hypokalemia -As needed repletion   AKI on CKD stage II -Baseline creatinine 1.3, peaked at 1.83, slowly improving, this morning 1.58   History of CAD status post CABG -Currently chest pain-free.  Continue aspirin, statin, Imdur, Toprol-XL, will adjust for soft blood pressure while he is getting diuretics   History of atrial fibrillation, paroxysmal -On amiodarone, Toprol-XL and Eliquis   COPD -Continue home bronchodilators.  Also added as needed.  I-S/flutter   GERD -Protonix   History of mood disorder - On amitriptyline   Hypothyroidism - Synthroid       DVT prophylaxis: SCDs Start: 08/26/22 2258 apixaban (ELIQUIS) tablet 5 mg  Code Status:  Family Communication:   Ongoing management for fluid overload and respiratory distress               Diet Orders (From admission, onward)     Start     Ordered   08/27/22 0824  Diet heart healthy/carb modified Room service appropriate? Yes;  Fluid consistency: Thin; Fluid restriction: 1800 mL Fluid  Diet effective now       Question Answer Comment  Diet-HS Snack? Nothing   Room service appropriate? Yes   Fluid consistency: Thin   Fluid restriction: 1800 mL Fluid      08/27/22 0824            Subjective:  Seen at bedside, tells me she feels better.  Sitting up in the recliner.  Does have exertional dyspnea and hypoxia  Examination:  General exam: Appears calm and comfortable  Respiratory system: Minimal bibasilar crackles Cardiovascular system: S1 & S2 heard, RRR. No JVD, murmurs, rubs, gallops or clicks. No pedal edema. Gastrointestinal system: Abdomen is nondistended, soft and nontender. No organomegaly or masses felt. Normal bowel sounds heard. Central nervous system: Alert and oriented. No focal neurological deficits. Extremities: Symmetric 5 x 5 power. Skin: No rashes, lesions or ulcers Psychiatry: Judgement and insight appear normal. Mood & affect appropriate.  Objective: Vitals:   08/30/22 0430 08/30/22 0500 08/30/22 0735 08/30/22 0917  BP: (!) 117/97   108/69  Pulse: 65   83  Resp: (!) 22     Temp: 97.6 F (36.4 C)     TempSrc: Oral     SpO2: 95%  90%   Weight:  99.1 kg    Height:        Intake/Output Summary (Last 24 hours) at 08/30/2022 1156 Last data filed at 08/30/2022 0915 Gross per 24 hour  Intake  780 ml  Output 1300 ml  Net -520 ml   Filed Weights   08/28/22 0500 08/29/22 0500 08/30/22 0500  Weight: 100.3 kg 99.2 kg 99.1 kg    Scheduled Meds:  amiodarone  400 mg Oral BID   amitriptyline  25 mg Oral QHS   apixaban  5 mg Oral BID   arformoterol  15 mcg Nebulization BID   And   umeclidinium bromide  1 puff Inhalation Daily   aspirin EC  81 mg Oral Daily   budesonide  0.5 mg Nebulization BID   cholecalciferol  2,000 Units Oral Daily   cyclobenzaprine  10 mg Oral QPC breakfast   furosemide  60 mg Intravenous BID   gabapentin  200 mg Oral BID   isosorbide mononitrate  30 mg Oral  Daily   levothyroxine  100 mcg Oral QAC breakfast   metoprolol succinate  12.5 mg Oral Daily   montelukast  10 mg Oral QHS   multivitamin  1 tablet Oral Daily   multivitamin with minerals  1 tablet Oral Q breakfast   pantoprazole  40 mg Oral Daily   potassium chloride SA  20 mEq Oral BID   rosuvastatin  20 mg Oral QHS   sodium chloride flush  3 mL Intravenous Q12H   Continuous Infusions:  sodium chloride      Nutritional status     Body mass index is 30.47 kg/m.  Data Reviewed:   CBC: Recent Labs  Lab 08/26/22 1656 08/28/22 0437 08/29/22 0527 08/30/22 0512  WBC 6.9 6.5 6.6 6.3  NEUTROABS 5.2  --   --   --   HGB 13.8 12.7* 13.1 12.2*  HCT 42.8 40.4 41.7 39.0  MCV 92.4 95.1 93.3 94.7  PLT 154 160 143* 144*   Basic Metabolic Panel: Recent Labs  Lab 08/26/22 1656 08/27/22 0056 08/28/22 0437 08/29/22 0527 08/30/22 0512  NA 137 137 133* 138 136  K 3.1* 3.5 3.9 4.5 3.8  CL 101 97* 97* 101 100  CO2 25 29 25 28 26   GLUCOSE 96 127* 97 86 95  BUN 13 14 16 17 20   CREATININE 1.37* 1.83* 1.71* 1.65* 1.58*  CALCIUM 7.8* 8.5* 8.7* 8.9 8.4*  MG  --   --  2.6* 2.6* 2.4  PHOS  --   --  4.3  --   --    GFR: Estimated Creatinine Clearance: 52.2 mL/min (A) (by C-G formula based on SCr of 1.58 mg/dL (H)). Liver Function Tests: Recent Labs  Lab 08/26/22 1656  AST 26  ALT 12  ALKPHOS 62  BILITOT 1.3*  PROT 6.2*  ALBUMIN 3.3*   No results for input(s): "LIPASE", "AMYLASE" in the last 168 hours. No results for input(s): "AMMONIA" in the last 168 hours. Coagulation Profile: No results for input(s): "INR", "PROTIME" in the last 168 hours. Cardiac Enzymes: No results for input(s): "CKTOTAL", "CKMB", "CKMBINDEX", "TROPONINI" in the last 168 hours. BNP (last 3 results) No results for input(s): "PROBNP" in the last 8760 hours. HbA1C: No results for input(s): "HGBA1C" in the last 72 hours. CBG: No results for input(s): "GLUCAP" in the last 168 hours. Lipid Profile: No  results for input(s): "CHOL", "HDL", "LDLCALC", "TRIG", "CHOLHDL", "LDLDIRECT" in the last 72 hours. Thyroid Function Tests: No results for input(s): "TSH", "T4TOTAL", "FREET4", "T3FREE", "THYROIDAB" in the last 72 hours. Anemia Panel: No results for input(s): "VITAMINB12", "FOLATE", "FERRITIN", "TIBC", "IRON", "RETICCTPCT" in the last 72 hours. Sepsis Labs: Recent Labs  Lab 08/29/22 0925  LATICACIDVEN 1.4  Recent Results (from the past 240 hour(s))  SARS Coronavirus 2 by RT PCR (hospital order, performed in [facility] hospital lab) *cepheid single result test* Anterior Nasal Swab     Status: None   Collection Time: 08/26/22  4:38 PM   Specimen: Anterior Nasal Swab  Result Value Ref Range Status   SARS Coronavirus 2 by RT PCR NEGATIVE NEGATIVE Final    Comment: (NOTE) SARS-CoV-2 target nucleic acids are NOT DETECTED.  The SARS-CoV-2 RNA is generally detectable in upper and lower respiratory specimens during the acute phase of infection. The lowest concentration of SARS-CoV-2 viral copies this assay can detect is 250 copies / mL. A negative result does not preclude SARS-CoV-2 infection and should not be used as the sole basis for treatment or other patient management decisions.  A negative result may occur with improper specimen collection / handling, submission of specimen other than nasopharyngeal swab, presence of viral mutation(s) within the areas targeted by this assay, and inadequate number of viral copies (<250 copies / mL). A negative result must be combined with clinical observations, patient history, and epidemiological information.  Fact Sheet for Patients:   RoadLapTop.co.za  Fact Sheet for Healthcare Providers: http://kim-miller.com/  This test is not yet approved or  cleared by the [place] FDA and has been authorized for detection and/or diagnosis of SARS-CoV-2 by FDA under an Emergency Use Authorization (EUA).   This EUA will remain in effect (meaning this test can be used) for the duration of the COVID-19 declaration under Section 564(b)(1) of the Act, 21 U.S.C. section 360bbb-3(b)(1), unless the authorization is terminated or revoked sooner.  Performed at [facility], [address], [place], [place] [address]          Radiology Studies: No results found.         LOS: 3 days   Time spent= 35 mins    Miguel Rota, MD [facility]  If 7PM-7AM, please contact night-coverage  08/30/2022, 11:56 AM

## 2022-08-30 NOTE — Progress Notes (Signed)
Rounding Note    Patient Name: Max Rivas Date of Encounter: 08/30/2022  [facility] Cardiologist: Ladona Ridgel  Subjective   Ongoing SOB  Inpatient Medications    Scheduled Meds:  amiodarone  400 mg Oral BID   amitriptyline  25 mg Oral QHS   apixaban  5 mg Oral BID   arformoterol  15 mcg Nebulization BID   And   umeclidinium bromide  1 puff Inhalation Daily   aspirin EC  81 mg Oral Daily   budesonide  0.5 mg Nebulization BID   cholecalciferol  2,000 Units Oral Daily   cyclobenzaprine  10 mg Oral QPC breakfast   furosemide  40 mg Intravenous BID   gabapentin  200 mg Oral BID   isosorbide mononitrate  30 mg Oral Daily   levothyroxine  100 mcg Oral QAC breakfast   metoprolol succinate  12.5 mg Oral Daily   montelukast  10 mg Oral QHS   multivitamin  1 tablet Oral Daily   multivitamin with minerals  1 tablet Oral Q breakfast   pantoprazole  40 mg Oral Daily   potassium chloride SA  20 mEq Oral BID   rosuvastatin  20 mg Oral QHS   sodium chloride flush  3 mL Intravenous Q12H   Continuous Infusions:  sodium chloride     PRN Meds: sodium chloride, acetaminophen, hydrALAZINE, levalbuterol, metoprolol tartrate, nitroGLYCERIN, ondansetron (ZOFRAN) IV, mouth rinse, senna-docusate, sodium chloride flush, traZODone   Vital Signs    Vitals:   08/29/22 1951 08/30/22 0430 08/30/22 0500 08/30/22 0735  BP: 116/67 (!) 117/97    Pulse: 78 65    Resp: (!) 21 (!) 22    Temp: (!) 97.5 F (36.4 C) 97.6 F (36.4 C)    TempSrc: Oral Oral    SpO2: 90% 95%  90%  Weight:   99.1 kg   Height:        Intake/Output Summary (Last 24 hours) at 08/30/2022 0805 Last data filed at 08/30/2022 0534 Gross per 24 hour  Intake 900 ml  Output 1850 ml  Net -950 ml      08/30/2022    5:00 AM 08/29/2022    5:00 AM 08/28/2022    5:00 AM  Last 3 Weights  Weight (lbs) 218 lb 8 oz 218 lb 11.2 oz 221 lb 1.9 oz  Weight (kg) 99.111 kg 99.202 kg 100.3 kg      Telemetry    SR,  infrequent runs NSVT but can be up to 20-30 seconds.  - Personally Reviewed  ECG    N/a - Personally Reviewed  Physical Exam   GEN: No acute distress.   Neck: + JVD Cardiac: RRR, no murmurs, rubs, or gallops.  Respiratory: crackles bilaterally GI: Soft, nontender, non-distended  MS: trace bilateral edema Neuro:  Nonfocal  Psych: Normal affect   Labs    High Sensitivity Troponin:   Recent Labs  Lab 08/26/22 1656 08/26/22 1930 08/27/22 0056 08/27/22 0450  TROPONINIHS 16 20* 20* 20*     Chemistry Recent Labs  Lab 08/26/22 1656 08/27/22 0056 08/28/22 0437 08/29/22 0527 08/30/22 0512  NA 137   < > 133* 138 136  K 3.1*   < > 3.9 4.5 3.8  CL 101   < > 97* 101 100  CO2 25   < > 25 28 26   GLUCOSE 96   < > 97 86 95  BUN 13   < > 16 17 20   CREATININE 1.37*   < >  1.71* 1.65* 1.58*  CALCIUM 7.8*   < > 8.7* 8.9 8.4*  MG  --   --  2.6* 2.6* 2.4  PROT 6.2*  --   --   --   --   ALBUMIN 3.3*  --   --   --   --   AST 26  --   --   --   --   ALT 12  --   --   --   --   ALKPHOS 62  --   --   --   --   BILITOT 1.3*  --   --   --   --   GFRNONAA 55*   < > 43* 44* 47*  ANIONGAP 11   < > 11 9 10    < > = values in this interval not displayed.    Lipids  Recent Labs  Lab 08/27/22 0056  CHOL 115  TRIG 90  HDL 36*  LDLCALC 61  CHOLHDL 3.2    Hematology Recent Labs  Lab 08/28/22 0437 08/29/22 0527 08/30/22 0512  WBC 6.5 6.6 6.3  RBC 4.25 4.47 4.12*  HGB 12.7* 13.1 12.2*  HCT 40.4 41.7 39.0  MCV 95.1 93.3 94.7  MCH 29.9 29.3 29.6  MCHC 31.4 31.4 31.3  RDW 15.3 15.3 15.3  PLT 160 143* 144*   Thyroid  Recent Labs  Lab 08/27/22 0056  TSH 3.915    BNP Recent Labs  Lab 08/26/22 1659 08/29/22 0527  BNP 684.4* 829.7*    DDimer No results for input(s): "DDIMER" in the last 168 hours.   Radiology    DG Chest Port 1 View  Result Date: 08/28/2022 CLINICAL DATA:  Dyspnea. EXAM: PORTABLE CHEST 1 VIEW COMPARISON:  Radiographs 08/26/2022.  CT 07/10/2022.  FINDINGS: 0902 hours. Left subclavian AICD leads appear unchanged, projecting to the level of the right ventricular apex. The heart size and mediastinal contours are stable status post median sternotomy, CABG and left atrial appendage clipping. Chronic right-sided pleural thickening and calcifications are unchanged. Interval improvement in the aeration of the lungs with decreased patchy bibasilar opacities. No evidence of pneumothorax or significant pleural effusion. No acute osseous findings. IMPRESSION: Interval improved aeration of the lungs with decreased bibasilar opacities. No acute cardiopulmonary process. Electronically Signed   By: Carey Bullocks M.D.   On: 08/28/2022 12:53    Cardiac Studies    Patient Profile     SEVIN PACHA is a 72 y.o. male with a hx of f CAD with multiple prior PCIs and CABG in 05/2019, left atrial appendage clipping, chronic HFrEF, COPD on home O2, VT with ICD and prior ablation 09/2019 at [facility], [place] on eliquis who is being seen 08/29/2022 for the evaluation of SOB at the request of Dr Nelson Chimes.   Assessment & Plan    1.Acute on chronic HFrEF -08/2022 echo: LVEF 25-30%, severe RV dysfunction, dilated fixed IVC - BNP 684, CXR +edema    - negative yesterday, totral I/Os this admit are incomplete. Standing weights over 24 hrs 218-->218. He is in IV lasix 40mg  bid. Initial uptrend in Cr but within prior range and trending down over the last few days. Increase lasix to 60mg  bid today.   -medical therapy with jardiance 10mg , imdur 30, toprol 12.5. Hold jardiance as inpatient. Has not been on ACE/ARB/ARNI/MRA at home, assume due to soft bp's, review chart for more details. BP's soft and labile Cr here,  would not inititate at this time.  2.CAD - multiple prior PCIs and CABG in 05/2019 - no acute issues - continue medical therapy. Assume on ASA with eliquis as outpatient given extensive CAD history  3.History of VT - long history recurrent VT. Has AICD, prior  ablation - some runs of NSVT, infrequent but can be up to 20-30 seconds. Increased oral amio to 400mg  bid x 7 days, then back to 200mg  daily (change day would be 09/05/22). - keep K at 4, Mg at 2      4. COPD - on 5L [place] at home.   5.PAF - continue amio, toprol, eliquis   For questions or updates, please contact [facility] Please consult [email or web address] for contact info under        Signed, Dina Rich, MD  08/30/2022, 8:05 AM

## 2022-08-31 DIAGNOSIS — J9621 Acute and chronic respiratory failure with hypoxia: Secondary | ICD-10-CM | POA: Diagnosis not present

## 2022-08-31 LAB — CBC
HCT: 41.4 % (ref 39.0–52.0)
Hemoglobin: 13 g/dL (ref 13.0–17.0)
MCH: 29.3 pg (ref 26.0–34.0)
MCHC: 31.4 g/dL (ref 30.0–36.0)
MCV: 93.5 fL (ref 80.0–100.0)
Platelets: 145 10*3/uL — ABNORMAL LOW (ref 150–400)
RBC: 4.43 MIL/uL (ref 4.22–5.81)
RDW: 15.3 % (ref 11.5–15.5)
WBC: 5.9 10*3/uL (ref 4.0–10.5)
nRBC: 0 % (ref 0.0–0.2)

## 2022-08-31 LAB — BASIC METABOLIC PANEL
Anion gap: 10 (ref 5–15)
BUN: 18 mg/dL (ref 8–23)
CO2: 25 mmol/L (ref 22–32)
Calcium: 8.6 mg/dL — ABNORMAL LOW (ref 8.9–10.3)
Chloride: 100 mmol/L (ref 98–111)
Creatinine, Ser: 1.54 mg/dL — ABNORMAL HIGH (ref 0.61–1.24)
GFR, Estimated: 48 mL/min — ABNORMAL LOW (ref >60)
Glucose, Bld: 94 mg/dL (ref 70–99)
Potassium: 4.7 mmol/L (ref 3.5–5.1)
Sodium: 135 mmol/L (ref 135–145)

## 2022-08-31 LAB — MAGNESIUM: Magnesium: 2.4 mg/dL (ref 1.7–2.4)

## 2022-08-31 NOTE — Progress Notes (Addendum)
Patient Name: TENG BURROWES Date of Encounter: 08/31/2022 [facility] Cardiologist: None Previously followed by [facility]   Interval Summary  .    Patient denies chest pain. Continues to have shortness of breath and his breathing does not feel much better than yesterday. Has orthopnea, ankle edema. Reports good urine output. He has been followed by a cardiologist through the [facility]   Vital Signs .    Vitals:   08/31/22 0510 08/31/22 0714 08/31/22 0721 08/31/22 0925  BP:    106/73  Pulse:  75  76  Resp:  18    Temp:      TempSrc:      SpO2:  (!) 80% 98% 93%  Weight: 99.8 kg     Height:        Intake/Output Summary (Last 24 hours) at 08/31/2022 0954 Last data filed at 08/31/2022 0728 Gross per 24 hour  Intake 240 ml  Output 1600 ml  Net -1360 ml      08/31/2022    5:10 AM 08/30/2022    5:00 AM 08/29/2022    5:00 AM  Last 3 Weights  Weight (lbs) 220 lb 0.3 oz 218 lb 8 oz 218 lb 11.2 oz  Weight (kg) 99.8 kg 99.111 kg 99.202 kg      Telemetry/ECG    Atrial fibrillation, HR in the 70s. A few, infrequent episodes of NSVT this AM, longest lasting 12 seconds  - Personally Reviewed  Physical Exam .   GEN: No acute distress.  Sitting upright on the side of the bed eating breakfast  Neck: No JVD Cardiac: Irregular rate and rhythm. no murmurs, rubs, or gallops.  Respiratory: Crackles in bilateral lung bases. Occasional end expiratory wheezing. Normal WOB on HFNC 9 L  GI: Soft, nontender, non-distended  MS: 2+ edema in BLE   Assessment & Plan .     Acute on Chronic HFrEF  Biventricular heart failure  - Echocardiogram 8/15 showed EF 25-30%, severely reduced RV function, moderately elevated pulmonary artery systolic pressure  - On admission, BNP elevated to 684. CXR showed cardiomegaly with central vascular congestion  - Patient now on IV lasix 60 mg BID- output 1 L urine yesterday. Renal function stable  - BNP yesterday was up to 829. Continues to be on HFNC. Continues to  have SOB, orthopnea, ankle edema. Continue IV lasix 60 mg BID  - Patient's home GDMT includes imdur 30, jardiance 10 mg, toprol 12.5. Has not been on ACE/ARB/ARNI/MRA at home, assume due to low BP - BP today is low-normal. Creatinine 1.54. Hold off on adding additional GDMT for now. Hopefully can get on ARB/ARNI prior to DC  - In the past, patient has been followed by a cardiologist through the [place]. Given severely reduced EF, severely reduced RV function, VT, may benefit from advanced heart failure follow up. Will discuss with Dr. Antoine Poche.   CAD  Elevated Troponin  HLD  - Multiple prior PCIs and CABG in 05/2019  - Patient denies chest pain.  - hsTn 16, 20, 20, 20 - minimal elevation with flat trend. Suspect demand ischemia due to CHF and hypoxia  - Continue ASA- patient is on ASA and eliquis as an outpatient due to extensive CAD history. Hemoglobin is stable  - Continue crestor 20 mg daily- LDL 61 this admission   History of VT S/p AICD - Patient has a long history of recurrent VT. Has had ablation and AICD in the past  - On telemetry yesterday, patient had  some runs of NSVT- they were infrequent, but could last up to 20-30 seconds - Patient denies chest pain. hsTn flat at 20. In the past, EP  - Now on amiodarone 400 mg BID for 7 days (until 8/24). Then, back down to 200 mg daily  - He has not been shocked by his ICD in over 1 year  - Today, patient has continued to have brief episodes of NSVT, longest lasting only 12 seconds.  - Mag 2.4, K 4.7 today   COPD  - Normally on 5 L oxygen at home. Has inhalers ordered per primary   PAF  - Patient is currently in afib, but HR are well controlled  - Continue amiodarone - reloading for NSVT as above - Continue eliquis 5 mg Bid  - Continue Toprol 12.5 mg daily     For questions or updates, please contact [facility] Please consult [email or web address] for contact info under        Signed, Jonita Albee, PA-C   History and all  data above reviewed.  Patient examined.  I agree with the findings as above.  The patient is doing well but not yet returned to baseline with his breathing.  He denies any new pain.  The patient exam reveals COR:RRR  ,  Lungs: Decreased breath sounds at the bases  ,  Abd: Positive bowel sounds, no rebound no guarding, Ext No edema  .  All available labs, radiology testing, previous records reviewed. Agree with documented assessment and plan.   Acute biventricular heart failure: Continue IV diuresis.  I discussed with him transition of his chronic care to our [facility] which would be appropriate on discharge.  We will reassess the creatinine in the morning and if stable consider Entresto.  Jamaury Tanieka Pownall  6:14 PM  08/31/2022

## 2022-08-31 NOTE — Plan of Care (Signed)

## 2022-08-31 NOTE — Progress Notes (Signed)
Found patient on 7L [place] sats 80%, with increased WOB. RT gave patient scheduled breathing treatments, sats recovered to 98% and WOB improved. RN notified.

## 2022-08-31 NOTE — Progress Notes (Signed)
PROGRESS NOTE    Max Rivas  WUJ:[id number] DOB: December 25, 1950 DOA: 08/26/2022 PCP: [facility]     Brief Narrative:  72 year old with history of CAD status post MI with CABG, systolic CHF EF 35%, A-fib on Eliquis, chronic hypoxia on O2, COPD presents to the ED with worsening shortness of breath for the past 2 days.  He is admitted to the hospital with concerns of CHF exacerbation.  Chest x-ray showed cardiomegaly with central venous congestion/pulmonary edema.  Started on IV diuretics.     Assessment & Plan:  Principal Problem:   Acute on chronic hypoxic respiratory failure (HCC) Active Problems:   Hypoxia     Acute hypoxic respiratory distress Acute congestive heart failure with reduced ejection fraction, 30%.  Class III -Worsening hypoxia this morning on 6 L nasal cannula.  Will repeat chest x-ray.  Echocardiogram shows EF of 30%.  Currently on home medicine of Imdur, Toprol-XL.   -Cont IV Lasix. Hold Jardiance for now.  -Cardiology following.    Hypokalemia -As needed repletion   AKI on CKD stage II -Baseline creatinine 1.3, peaked at 1.83, slowly improving, this morning 1.54   History of CAD status post CABG -Currently chest pain-free.  Continue aspirin, statin, Imdur, Toprol-XL, will adjust for soft blood pressure while he is getting diuretics   History of atrial fibrillation, paroxysmal -On amiodarone, Toprol-XL and Eliquis   COPD -Continue home bronchodilators.  Also added as needed.  I-S/flutter   GERD -Protonix   History of mood disorder - On amitriptyline   Hypothyroidism - Synthroid       DVT prophylaxis: SCDs Start: 08/26/22 2258 apixaban (ELIQUIS) tablet 5 mg  Code Status:  Family Communication:   Ongoing management for fluid overload and respiratory distress               Diet Orders (From admission, onward)     Start     Ordered   08/27/22 0824  Diet heart healthy/carb modified Room service appropriate? Yes; Fluid  consistency: Thin; Fluid restriction: 1800 mL Fluid  Diet effective now       Question Answer Comment  Diet-HS Snack? Nothing   Room service appropriate? Yes   Fluid consistency: Thin   Fluid restriction: 1800 mL Fluid      08/27/22 0824            Subjective:  Feels better, urinating well.  Still has DOE  Examination:  General exam: Appears calm and comfortable  Respiratory system: minimal bibasilar crackles.  Cardiovascular system: S1 & S2 heard, RRR. No JVD, murmurs, rubs, gallops or clicks. No pedal edema. Gastrointestinal system: Abdomen is nondistended, soft and nontender. No organomegaly or masses felt. Normal bowel sounds heard. Central nervous system: Alert and oriented. No focal neurological deficits. Extremities: Symmetric 5 x 5 power. Skin: No rashes, lesions or ulcers Psychiatry: Judgement and insight appear normal. Mood & affect appropriate.  Objective: Vitals:   08/31/22 0510 08/31/22 0714 08/31/22 0721 08/31/22 0925  BP:    106/73  Pulse:  75  76  Resp:  18    Temp:      TempSrc:      SpO2:  (!) 80% 98% 93%  Weight: 99.8 kg     Height:        Intake/Output Summary (Last 24 hours) at 08/31/2022 1142 Last data filed at 08/31/2022 0930 Gross per 24 hour  Intake 360 ml  Output 1400 ml  Net -1040 ml   Filed Weights   08/29/22  0500 08/30/22 0500 08/31/22 0510  Weight: 99.2 kg 99.1 kg 99.8 kg    Scheduled Meds:  amiodarone  400 mg Oral BID   Followed by   Melene Muller ON 09/05/2022] amiodarone  200 mg Oral Daily   amitriptyline  25 mg Oral QHS   apixaban  5 mg Oral BID   arformoterol  15 mcg Nebulization BID   And   umeclidinium bromide  1 puff Inhalation Daily   aspirin EC  81 mg Oral Daily   budesonide  0.5 mg Nebulization BID   cholecalciferol  2,000 Units Oral Daily   cyclobenzaprine  10 mg Oral QPC breakfast   furosemide  60 mg Intravenous BID   gabapentin  200 mg Oral BID   isosorbide mononitrate  30 mg Oral Daily   levothyroxine  100 mcg  Oral QAC breakfast   metoprolol succinate  12.5 mg Oral Daily   montelukast  10 mg Oral QHS   multivitamin  1 tablet Oral Daily   multivitamin with minerals  1 tablet Oral Q breakfast   pantoprazole  40 mg Oral Daily   potassium chloride SA  20 mEq Oral BID   rosuvastatin  20 mg Oral QHS   sodium chloride flush  3 mL Intravenous Q12H   Continuous Infusions:  sodium chloride      Nutritional status     Body mass index is 30.69 kg/m.  Data Reviewed:   CBC: Recent Labs  Lab 08/26/22 1656 08/28/22 0437 08/29/22 0527 08/30/22 0512 08/31/22 0449  WBC 6.9 6.5 6.6 6.3 5.9  NEUTROABS 5.2  --   --   --   --   HGB 13.8 12.7* 13.1 12.2* 13.0  HCT 42.8 40.4 41.7 39.0 41.4  MCV 92.4 95.1 93.3 94.7 93.5  PLT 154 160 143* 144* 145*   Basic Metabolic Panel: Recent Labs  Lab 08/27/22 0056 08/28/22 0437 08/29/22 0527 08/30/22 0512 08/31/22 0449  NA 137 133* 138 136 135  K 3.5 3.9 4.5 3.8 4.7  CL 97* 97* 101 100 100  CO2 29 25 28 26 25   GLUCOSE 127* 97 86 95 94  BUN 14 16 17 20 18   CREATININE 1.83* 1.71* 1.65* 1.58* 1.54*  CALCIUM 8.5* 8.7* 8.9 8.4* 8.6*  MG  --  2.6* 2.6* 2.4 2.4  PHOS  --  4.3  --   --   --    GFR: Estimated Creatinine Clearance: 53.7 mL/min (A) (by C-G formula based on SCr of 1.54 mg/dL (H)). Liver Function Tests: Recent Labs  Lab 08/26/22 1656  AST 26  ALT 12  ALKPHOS 62  BILITOT 1.3*  PROT 6.2*  ALBUMIN 3.3*   No results for input(s): "LIPASE", "AMYLASE" in the last 168 hours. No results for input(s): "AMMONIA" in the last 168 hours. Coagulation Profile: No results for input(s): "INR", "PROTIME" in the last 168 hours. Cardiac Enzymes: No results for input(s): "CKTOTAL", "CKMB", "CKMBINDEX", "TROPONINI" in the last 168 hours. BNP (last 3 results) No results for input(s): "PROBNP" in the last 8760 hours. HbA1C: No results for input(s): "HGBA1C" in the last 72 hours. CBG: No results for input(s): "GLUCAP" in the last 168 hours. Lipid  Profile: No results for input(s): "CHOL", "HDL", "LDLCALC", "TRIG", "CHOLHDL", "LDLDIRECT" in the last 72 hours. Thyroid Function Tests: No results for input(s): "TSH", "T4TOTAL", "FREET4", "T3FREE", "THYROIDAB" in the last 72 hours. Anemia Panel: No results for input(s): "VITAMINB12", "FOLATE", "FERRITIN", "TIBC", "IRON", "RETICCTPCT" in the last 72 hours. Sepsis Labs: Recent Labs  Lab 08/29/22 0925  LATICACIDVEN 1.4    Recent Results (from the past 240 hour(s))  SARS Coronavirus 2 by RT PCR (hospital order, performed in [facility] hospital lab) *cepheid single result test* Anterior Nasal Swab     Status: None   Collection Time: 08/26/22  4:38 PM   Specimen: Anterior Nasal Swab  Result Value Ref Range Status   SARS Coronavirus 2 by RT PCR NEGATIVE NEGATIVE Final    Comment: (NOTE) SARS-CoV-2 target nucleic acids are NOT DETECTED.  The SARS-CoV-2 RNA is generally detectable in upper and lower respiratory specimens during the acute phase of infection. The lowest concentration of SARS-CoV-2 viral copies this assay can detect is 250 copies / mL. A negative result does not preclude SARS-CoV-2 infection and should not be used as the sole basis for treatment or other patient management decisions.  A negative result may occur with improper specimen collection / handling, submission of specimen other than nasopharyngeal swab, presence of viral mutation(s) within the areas targeted by this assay, and inadequate number of viral copies (<250 copies / mL). A negative result must be combined with clinical observations, patient history, and epidemiological information.  Fact Sheet for Patients:   RoadLapTop.co.za  Fact Sheet for Healthcare Providers: http://kim-miller.com/  This test is not yet approved or  cleared by the [place] FDA and has been authorized for detection and/or diagnosis of SARS-CoV-2 by FDA under an Emergency Use  Authorization (EUA).  This EUA will remain in effect (meaning this test can be used) for the duration of the COVID-19 declaration under Section 564(b)(1) of the Act, 21 U.S.C. section 360bbb-3(b)(1), unless the authorization is terminated or revoked sooner.  Performed at [facility], [address], [place], [place] [address]          Radiology Studies: No results found.         LOS: 4 days   Time spent= 35 mins    Miguel Rota, MD [facility]  If 7PM-7AM, please contact night-coverage  08/31/2022, 11:42 AM

## 2022-09-01 ENCOUNTER — Encounter (HOSPITAL_COMMUNITY): Payer: Self-pay | Admitting: Internal Medicine

## 2022-09-01 ENCOUNTER — Inpatient Hospital Stay (HOSPITAL_COMMUNITY): Payer: Medicare Other

## 2022-09-01 DIAGNOSIS — J9621 Acute and chronic respiratory failure with hypoxia: Secondary | ICD-10-CM | POA: Diagnosis not present

## 2022-09-01 LAB — CBC
HCT: 42.5 % (ref 39.0–52.0)
Hemoglobin: 13.2 g/dL (ref 13.0–17.0)
MCH: 29.5 pg (ref 26.0–34.0)
MCHC: 31.1 g/dL (ref 30.0–36.0)
MCV: 94.9 fL (ref 80.0–100.0)
Platelets: 143 10*3/uL — ABNORMAL LOW (ref 150–400)
RBC: 4.48 MIL/uL (ref 4.22–5.81)
RDW: 15.5 % (ref 11.5–15.5)
WBC: 6.4 10*3/uL (ref 4.0–10.5)
nRBC: 0 % (ref 0.0–0.2)

## 2022-09-01 LAB — BASIC METABOLIC PANEL
Anion gap: 9 (ref 5–15)
BUN: 23 mg/dL (ref 8–23)
CO2: 27 mmol/L (ref 22–32)
Calcium: 8.4 mg/dL — ABNORMAL LOW (ref 8.9–10.3)
Chloride: 100 mmol/L (ref 98–111)
Creatinine, Ser: 1.62 mg/dL — ABNORMAL HIGH (ref 0.61–1.24)
GFR, Estimated: 45 mL/min — ABNORMAL LOW (ref >60)
Glucose, Bld: 108 mg/dL — ABNORMAL HIGH (ref 70–99)
Potassium: 4.9 mmol/L (ref 3.5–5.1)
Sodium: 136 mmol/L (ref 135–145)

## 2022-09-01 LAB — BRAIN NATRIURETIC PEPTIDE: B Natriuretic Peptide: 812 pg/mL — ABNORMAL HIGH (ref 0.0–100.0)

## 2022-09-01 LAB — MAGNESIUM: Magnesium: 2.5 mg/dL — ABNORMAL HIGH (ref 1.7–2.4)

## 2022-09-01 MED ORDER — FAMOTIDINE 20 MG PO TABS
20.0000 mg | ORAL_TABLET | Freq: Every day | ORAL | Status: DC
  Administered 2022-09-01 – 2022-09-07 (×7): 20 mg via ORAL
  Filled 2022-09-01 (×7): qty 1

## 2022-09-01 MED ORDER — PANTOPRAZOLE SODIUM 40 MG PO TBEC
40.0000 mg | DELAYED_RELEASE_TABLET | Freq: Two times a day (BID) | ORAL | Status: DC
  Administered 2022-09-01 – 2022-09-08 (×14): 40 mg via ORAL
  Filled 2022-09-01 (×14): qty 1

## 2022-09-01 MED ORDER — REVEFENACIN 175 MCG/3ML IN SOLN
175.0000 ug | Freq: Every day | RESPIRATORY_TRACT | Status: DC
  Administered 2022-09-01 – 2022-09-08 (×8): 175 ug via RESPIRATORY_TRACT
  Filled 2022-09-01 (×8): qty 3

## 2022-09-01 MED ORDER — FUROSEMIDE 10 MG/ML IJ SOLN
80.0000 mg | Freq: Two times a day (BID) | INTRAMUSCULAR | Status: DC
  Administered 2022-09-01 – 2022-09-03 (×4): 80 mg via INTRAVENOUS
  Filled 2022-09-01 (×4): qty 8

## 2022-09-01 NOTE — Evaluation (Signed)
SLP Cancellation Note  Patient Details Name: DALYNN FARAG MRN: [id number] DOB: [birth year]   Cancelled treatment:       Reason Eval/Treat Not Completed: Other (comment) (swallow eval ordered, await CT chest results prior to evaluation; will continue efforts, RN reports no overt dysphagia from her observations)  Rolena Infante, MS [facility] SLP Acute Rehab Services Office [phone]  Chales Abrahams 09/01/2022, 12:06 PM

## 2022-09-01 NOTE — Consult Note (Signed)
NAME:  Max Rivas, MRN:  [id number], DOB:  Dec 31, 1950, LOS: 5 ADMISSION DATE:  08/26/2022, CONSULTATION DATE:  8/20 REFERRING MD:  Nelson Chimes, CHIEF COMPLAINT:  hypoxia    History of Present Illness:  This is a 72 year old male w/ extensive cardiopulmonary hx as listed below. Last seem in our office by Dr Delton Coombes back in 2020 at that time was still active smoker and was being maintained on Stiolto, Asmanex and PRN xopenex. He has been O2 dependent since 2021 (initial 4 liters pulse to cont 5 liters since June 2024).  Carries a dx of OSA, (CPAP intolerant w/ several failed attempts)  Also has had complicated cardiac hx w/ several admits for VT now has AICD. Admitted 8/14 w/ cc: 2 d h/o shortness of breath, NP cough and 3 lb wt gain. His initial PCXR was c/w pulmonary edema and also showed chronic calcified right pleural thickening w/ pleural plaque. His COVID was negative, PCT negative, trop I negative, BNP 684, pulse ox 50s on 5 lpm. Supplemental oxygen was titrated up, he was administered IV lasix, and admitted w/ working dx of acute on chronic hypoxic resp failure 2/2 acute on chronic HF w/ pulmonary edema.  Hospital course 8/14 admitted. Escalated O2 and lasix 8/15 worsening renal fxn. Lasix held. Breathing a little better.  ECHO EF 25-30% w/ global hypokinesis and severely decreased RV systolic fxn. Also mod elevated PAS w/ RA and LA dilation, (reflected decreased EF from June 2024, and RV was mod reduced at that point)  8/16 sats down to 81% on 8 lpm when ambulating.  8/17 cards consulted. Recommended aggressive diuresis in spite of rising scr. BP soft side so recommended holding Jardiance, ACE/ARB/ARNI/MRA 8/18 feeling better. Still w/ exertional dyspnea  8/19 feeling about same as 8/18 still had orthopnea and LE edema -2.9 liters. Again plan was to continue IV diuresis for acute biventricular HF.  Got CT chest that showed small left effusion, prob edema and left subseg atx, calcified pleural plaque  on right hemithorax, severe emphysema changes and prior CABG changes.  As of 8/20 he remains on 8 lpm  still gets severely short of breath w/ activity and from a cardiac stand-point he is on lasix 60mg  bid,  lopressor and imdur. Because of his sig exertional dyspnea and hypoxia we were asked to evaluate.   Pertinent  Medical History  CAD w/ Prior MI, prior CABG, HFrEF (35%), afib on DOAC, Chronic hypoxic resp failure. 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 Prior TIA, OSA  Significant Hospital Events: Including procedures, antibiotic start and stop dates in addition to other pertinent events   8/14 admitted w/ acute HF and pulm edema  8/20 Pulm asked to see for progressive hypoxia.   Interim History / Subjective:  Sitting up in bed,   Objective   Blood pressure 100/65, pulse 85, temperature (!) 97.5 F (36.4 C), temperature source Oral, resp. rate 18, height 5\' 11"  (1.803 m), weight 99.5 kg, SpO2 (!) 86%.        Intake/Output Summary (Last 24 hours) at 09/01/2022 1421 Last data filed at 09/01/2022 1345 Gross per 24 hour  Intake 720 ml  Output 1150 ml  Net -430 ml   Filed Weights   08/31/22 0510 09/01/22 0348 09/01/22 0600  Weight: 99.8 kg 99.5 kg 99.5 kg    Examination: General:  72 year old chronically ill appearing male. Sitting up in bed. He is in No distress. Able to speak full sentences but is on 8 LPM and still gets very SOB just walking 5 feet [place] HENT: NCAT no JVD MMM Lungs: crackles both bases. Some occ scattered wheezing. CT chest personally reviewed: sever bullous emphysematous changes. Chronic calcified scarring of right pleural space. 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 Unfortunately little to offer here. Given his advanced COPD and HF he would almost certainly benefit for nocturnal ventilation w/ BIPAP or NIPPV but he is intolerant of the mask (failed several occasions). I do think he is Hospice appropriate and both his life span and activity tolerance will be limited from this.  Plan/rec Will change his BD regimen to nebulized. He feels like this works better than his current MDI and I suspect at this point timing proper MDI administration is difficult Cont supplemental oxygen. He is going to need minimally 6 liters at rest and will likely need up to 10 for ambulation and I suspect even then activity will be limited. I am going to request he gets an oximizer to see if helps some Night time cont pulse oximetry to see if desaturating during sleep and adjust O2 as needed.  Will escalate his reflux regimen. He still reports severe reflux (worse when he takes his fish oil ->so stopped).  Also his asmanex was a DPI so I suspect this is at least contributing to his vocal hoarsness Additionally need to maximize volume status w/ diuretics Will also ask palliative to see him. I think we may see some  symptomatic improvements w/ the changes above but really feel like he is end-stage and we are rapidly entering a place where we have very little else to offer other than symptom care. He is open to this so I will place the consult    Best Practice (right click and "Reselect all SmartList Selections" daily)   Per primary   Labs   CBC: Recent Labs  Lab 08/26/22 1656 08/28/22 0437 08/29/22 0527 08/30/22 0512 08/31/22 0449 09/01/22 0505  WBC 6.9 6.5 6.6 6.3 5.9 6.4  NEUTROABS 5.2  --   --   --   --   --   HGB 13.8 12.7* 13.1 12.2* 13.0 13.2  HCT 42.8 40.4 41.7 39.0 41.4 42.5  MCV 92.4 95.1 93.3 94.7 93.5 94.9  PLT 154 160 143* 144* 145* 143*    Basic Metabolic Panel: Recent Labs  Lab 08/28/22 0437 08/29/22 0527 08/30/22 0512 08/31/22 0449 09/01/22 0505  NA 133* 138 136 135 136  K 3.9 4.5 3.8 4.7 4.9  CL 97* 101 100 100 100  CO2 25 28 26 25 27   GLUCOSE 97 86 95 94 108*  BUN 16 17 20 18 23   CREATININE 1.71* 1.65* 1.58* 1.54* 1.62*  CALCIUM 8.7* 8.9 8.4* 8.6* 8.4*  MG 2.6* 2.6* 2.4 2.4 2.5*  PHOS 4.3  --   --   --   --    GFR: Estimated Creatinine Clearance: 51 mL/min (A) (by C-G formula based on SCr of 1.62 mg/dL (H)). Recent Labs  Lab 08/29/22 0527 08/29/22 0925 08/30/22 0512 08/31/22 0449 09/01/22 0505  WBC 6.6  --  6.3 5.9 6.4  LATICACIDVEN  --  1.4  --   --   --     Liver Function Tests: Recent Labs  Lab 08/26/22 1656  AST 26  ALT 12  ALKPHOS 62  BILITOT 1.3*  PROT 6.2*  ALBUMIN 3.3*   No results for input(s): "LIPASE", "AMYLASE" in the last 168 hours. No results for input(s): "AMMONIA" in the last 168 hours.  ABG    Component Value Date/Time   PHART 7.376 09/18/2019 1750   PCO2ART 50.6 (H) 09/18/2019 1750   PO2ART 298 (H) 09/18/2019 1750   HCO3 24.7 04/25/2021 0206   TCO2 27 07/07/2022 1431   ACIDBASEDEF 3.0 (H) 05/23/2019 1330   O2SAT 88 04/25/2021 0206     Coagulation Profile: No results for input(s): "INR", "PROTIME" in the last  168 hours.  Cardiac Enzymes: No results for input(s): "CKTOTAL", "CKMB", "CKMBINDEX", "TROPONINI" in the last 168 hours.  HbA1C: Hgb A1c MFr Bld  Date/Time Value Ref Range Status  06/24/2021 07:29 AM 5.6 4.8 - 5.6 % Final    Comment:    (NOTE) Pre diabetes:          5.7%-6.4%  Diabetes:              >6.4%  Glycemic control for   <7.0% adults with diabetes   12/31/2019 06:49 PM 5.4 4.8 - 5.6 % Final    Comment:    (NOTE) Pre diabetes:          5.7%-6.4%  Diabetes:              >6.4%  Glycemic control for   <7.0% adults with diabetes     CBG: No results for input(s): "GLUCAP" in the last 168 hours.  Review of Systems:   Review of Systems  Constitutional:  Positive for malaise/fatigue. Negative for fever and weight loss.  HENT:  Positive for sore throat.   Eyes: Negative.   Respiratory:  Positive for shortness of breath and wheezing. Negative for cough and sputum production.   Cardiovascular:  Positive for chest pain, orthopnea and leg swelling.  Gastrointestinal:  Positive for heartburn.  Genitourinary: Negative.   Musculoskeletal: Negative.   Skin: Negative.   Neurological: Negative.   Psychiatric/Behavioral: Negative.       Past Medical History:  He,  has a past medical history of Arthritis, Bronchitis (2006), CAD (coronary artery disease), COPD (chronic obstructive pulmonary disease) (HCC), GERD (gastroesophageal reflux disease), HFmrEF (heart failure with mid-range ejection fraction) (HCC), High cholesterol, Ischemic cardiomyopathy, Morbid obesity (HCC), Myocardial infarction (HCC) (~ 1995 X 2;1998; 2000; 2004), On home oxygen therapy, PAF (paroxysmal atrial fibrillation) (HCC), Pulmonary embolism (HCC) (02/24/2016), Sleep apnea, TIA (transient ischemic attack) (06/2021), and Ventricular tachycardia (HCC).   Surgical History:   Past Surgical History:  Procedure Laterality Date   CARDIOVERSION N/A 09/11/2019   Procedure: CARDIOVERSION;  Surgeon: Jodelle Red, MD;  Location: [facility]  ENDOSCOPY;  Service: Cardiovascular;  Laterality: N/A;   CLIPPING OF ATRIAL APPENDAGE N/A 05/23/2019   Procedure: CLIPPING OF ATRIAL APPENDAGE with atriclip;  Surgeon: [name], MD;  Location: [facility] OR;  Service: Open Heart Surgery;  Laterality: N/A;   CORONARY ANGIOPLASTY  1995   CORONARY ANGIOPLASTY WITH STENT PLACEMENT  ~ 1995 - 2004 X 5   "I've got a total of 5 stents" (02/24/2016)   CORONARY ARTERY BYPASS GRAFT N/A 05/23/2019   Procedure: CORONARY ARTERY BYPASS GRAFTING (CABG), times three, using right greater saphenous vein and left internal mammary;  Surgeon: [name], MD;  Location: [facility] OR;  Service: Open Heart Surgery;  Laterality: N/A;  [place] only   CORONARY PRESSURE/FFR STUDY N/A 05/12/2019   Procedure: INTRAVASCULAR PRESSURE WIRE/FFR STUDY;  Surgeon: Lyn Records, MD;  Location: [facility] INVASIVE CV LAB;  Service: Cardiovascular;  Laterality: N/A;   heart ablation  2022   INSERT / REPLACE / REMOVE PACEMAKER  07/2003   original PPM around 2004 for ICM with EF < 35%   LEFT HEART CATH AND CORONARY ANGIOGRAPHY N/A 05/12/2019   Procedure: LEFT HEART CATH AND CORONARY ANGIOGRAPHY;  Surgeon: Lyn Records, MD;  Location: [facility] INVASIVE CV LAB;  Service: Cardiovascular;  Laterality: N/A;   PACEMAKER GENERATOR CHANGE  03/2011    [facility]   TEE WITHOUT CARDIOVERSION N/A 05/23/2019   Procedure: TRANSESOPHAGEAL ECHOCARDIOGRAM (TEE);  Surgeon: [name], MD;  Location[facility];  Service: Open Heart Surgery;  Laterality: N/A;   TONSILLECTOMY       Social History:   reports that he quit smoking about 3 years ago. His smoking use included cigarettes. He started smoking about 38 years ago. He has a 105 pack-year smoking history. He quit smokeless tobacco use about 5 years ago. He reports that he does not currently use alcohol. He reports that he does not currently use drugs after having used the following drugs: Cocaine.   Family History:  His family history  includes Heart attack in his father and mother; Heart attack (age of onset: 60) in his sister; Heart attack (age of onset: 29) in his brother.   Allergies Allergies  Allergen Reactions   Crestor [Rosuvastatin Calcium] Other (See Comments)    Back spasms, initially, but can tolerate it at a low dose now, in 2022 Tolerating 20mg  QD   Lipitor [Atorvastatin] Other (See Comments)    Spasticity   Ventolin [Albuterol] Palpitations and Other (See Comments)    Irregular heartbeat     Home Medications  Prior to Admission medications   Medication Sig Start Date End Date Taking? Authorizing Provider  acetaminophen (TYLENOL) 500 MG tablet Take 1,000 mg by mouth every 8 (eight) hours as needed for moderate pain, headache or mild pain.   Yes [provider]  amiodarone (PACERONE) 200 MG tablet Take 1 tablet (200 mg total) by mouth 2 (two) times daily for 7 days, THEN 1 tablet (200 mg total) daily. Patient taking differently: 200 mg once daily. 06/25/21 08/26/22 Yes Elgergawy, Leana Roe, MD  amitriptyline (ELAVIL) 25 MG tablet Take 25 mg by mouth at bedtime.   Yes [provider]  apixaban (ELIQUIS) 5 MG TABS tablet Take 5 mg by mouth 2 (two) times daily.   Yes [provider]  aspirin EC 81 MG tablet Take 81 mg by mouth daily.   Yes [provider]  carboxymethylcellulose 1 % ophthalmic solution Place 1 drop into both eyes 4 (four) times daily.  Yes [provider]  Cholecalciferol (VITAMIN D3) 50 MCG (2000 UT) TABS Take 2,000 Units by mouth daily.   Yes [provider]  cyclobenzaprine (FLEXERIL) 10 MG tablet Take 10 mg by mouth daily after breakfast.   Yes [provider]  empagliflozin (JARDIANCE) 10 MG TABS tablet Take 1 tablet (10 mg total) by mouth daily. 07/12/22  Yes Kathlen Mody, MD  furosemide (LASIX) 20 MG tablet Take 3 tablets (60 mg total) by mouth 2 (two) times daily. 07/11/22  Yes Kathlen Mody, MD  gabapentin (NEURONTIN) 100 MG  capsule Take 2 capsules (200 mg total) by mouth 2 (two) times daily. 06/01/19  Yes Barrett, Erin R, PA-C  isosorbide mononitrate (IMDUR) 30 MG 24 hr tablet Take 1 tablet (30 mg total) by mouth daily. 03/13/21  Yes Harris, Abigail, PA-C  levalbuterol [facility] HFA) 45 MCG/ACT inhaler Inhale 2 puffs into the lungs every 6 (six) hours as needed for wheezing or shortness of breath.   Yes [provider]  levothyroxine (SYNTHROID) 100 MCG tablet Take 100 mcg by mouth daily before breakfast.   Yes [provider]  lidocaine (LIDODERM) 5 % Place 2 patches onto the skin daily.   Yes [provider]  Magnesium 200 MG CHEW Chew 400 mg by mouth daily.   Yes [provider]  metoprolol succinate (TOPROL-XL) 25 MG 24 hr tablet Take 12.5 mg by mouth daily.   Yes [provider]  mometasone (ASMANEX) 220 MCG/INH inhaler Inhale 2 puffs into the lungs 2 (two) times daily.   Yes [provider]  montelukast (SINGULAIR) 10 MG tablet Take 10 mg by mouth at bedtime.   Yes [provider]  Multiple Vitamin (MULTIVITAMIN WITH MINERALS) TABS tablet Take 1 tablet by mouth daily with breakfast.   Yes [provider]  Multiple Vitamins-Minerals (PRESERVISION AREDS 2) CAPS Take 1 capsule by mouth 2 (two) times daily.   Yes [provider]  nitroGLYCERIN (NITROSTAT) 0.4 MG SL tablet Place 0.4 mg under the tongue every 5 (five) minutes as needed for chest pain.    Yes [provider]  Omega-3 Fatty Acids (FISH OIL) 1000 MG CAPS Take 1,000 mg by mouth in the morning and at bedtime.   Yes [provider]  omeprazole (PRILOSEC) 20 MG capsule Take 40 mg by mouth daily.   Yes [provider]  OXYGEN Inhale 4 L into the lungs continuous.   Yes [provider]  potassium chloride SA (KLOR-CON) 20 MEQ tablet Take 1 tablet (20 mEq total) by mouth 2 (two) times daily. 03/19/20  Yes Graciella Freer, PA-C  rosuvastatin  (CRESTOR) 40 MG tablet Take 20 mg by mouth at bedtime.   Yes [provider]  Tiotropium Bromide-Olodaterol 2.5-2.5 MCG/ACT AERS Inhale 2 puffs into the lungs daily.   Yes [provider]     Critical care time: NA     Simonne Martinet ACNP-BC [facility] Pulmonary/Critical Care Pager # [phone] OR # [phone] if no answer

## 2022-09-01 NOTE — Progress Notes (Signed)
PROGRESS NOTE    Max Rivas  QMV:[id number] DOB: 1950/04/08 DOA: 08/26/2022 PCP: [facility]     Brief Narrative:  72 year old with history of CAD status post MI with CABG, systolic CHF EF 35%, A-fib on Eliquis, chronic hypoxia on O2, COPD presents to the ED with worsening shortness of breath for the past 2 days.  He is admitted to the hospital with concerns of CHF exacerbation.  Chest x-ray showed cardiomegaly with central venous congestion/pulmonary edema.  Started on IV diuretics.     Assessment & Plan:  Principal Problem:   Acute on chronic hypoxic respiratory failure (HCC) Active Problems:   Hypoxia     Acute hypoxic respiratory distress Acute congestive heart failure with reduced ejection fraction, 30%.  Class III -Worsening hypoxia this morning on 8 L nasal cannula.  Will repeat chest x-ray.  Echocardiogram shows EF of 30%.  Currently on home medicine of Imdur, Toprol-XL.  Currently Jardiance is on hold, diuretics managed by cardiology team. -Hypoxia is not really improving much, will get CT chest without contrast to get better look at his parenchyma, repeat BNP and obtain speech and swallow evaluation   Hypokalemia -As needed repletion   AKI on CKD stage II -Baseline creatinine 1.3, peaked at 1.83, slowly improving, this morning 1.62   History of CAD status post CABG -Currently chest pain-free.  Continue aspirin, statin, Imdur, Toprol-XL, will adjust for soft blood pressure while he is getting diuretics   History of atrial fibrillation, paroxysmal -On amiodarone, Toprol-XL and Eliquis   COPD -Continue home bronchodilators.  Also added as needed.  I-S/flutter   GERD -Protonix   History of mood disorder - On amitriptyline   Hypothyroidism - Synthroid       DVT prophylaxis: SCDs Start: 08/26/22 2258 apixaban (ELIQUIS) tablet 5 mg  Code Status:  Family Communication:   Ongoing management for fluid overload and respiratory distress                Diet Orders (From admission, onward)     Start     Ordered   08/27/22 0824  Diet heart healthy/carb modified Room service appropriate? Yes; Fluid consistency: Thin; Fluid restriction: 1800 mL Fluid  Diet effective now       Question Answer Comment  Diet-HS Snack? Nothing   Room service appropriate? Yes   Fluid consistency: Thin   Fluid restriction: 1800 mL Fluid      08/27/22 0824            Subjective: Doing ok no complaints besides ongoing DOE   Examination:  General exam: Appears calm and comfortable  Respiratory system: b/l rhonchi Cardiovascular system: S1 & S2 heard, RRR. No JVD, murmurs, rubs, gallops or clicks. No pedal edema. Gastrointestinal system: Abdomen is nondistended, soft and nontender. No organomegaly or masses felt. Normal bowel sounds heard. Central nervous system: Alert and oriented. No focal neurological deficits. Extremities: Symmetric 5 x 5 power. Skin: No rashes, lesions or ulcers Psychiatry: Judgement and insight appear normal. Mood & affect appropriate.  Objective: Vitals:   09/01/22 0826 09/01/22 1052 09/01/22 1303 09/01/22 1311  BP:  115/66 100/65   Pulse:  76 85   Resp:   18   Temp:   (!) 97.5 F (36.4 C)   TempSrc:   Oral   SpO2: (!) 88%  (!) 85% (!) 86%  Weight:      Height:        Intake/Output Summary (Last 24 hours) at 09/01/2022 1332 Last data  filed at 09/01/2022 0830 Gross per 24 hour  Intake 720 ml  Output 650 ml  Net 70 ml   Filed Weights   08/31/22 0510 09/01/22 0348 09/01/22 0600  Weight: 99.8 kg 99.5 kg 99.5 kg    Scheduled Meds:  amiodarone  400 mg Oral BID   Followed by   Melene Muller ON 09/05/2022] amiodarone  200 mg Oral Daily   amitriptyline  25 mg Oral QHS   apixaban  5 mg Oral BID   arformoterol  15 mcg Nebulization BID   And   umeclidinium bromide  1 puff Inhalation Daily   aspirin EC  81 mg Oral Daily   budesonide  0.5 mg Nebulization BID   cholecalciferol  2,000 Units Oral Daily    cyclobenzaprine  10 mg Oral QPC breakfast   furosemide  60 mg Intravenous BID   gabapentin  200 mg Oral BID   isosorbide mononitrate  30 mg Oral Daily   levothyroxine  100 mcg Oral QAC breakfast   metoprolol succinate  12.5 mg Oral Daily   montelukast  10 mg Oral QHS   multivitamin  1 tablet Oral Daily   multivitamin with minerals  1 tablet Oral Q breakfast   pantoprazole  40 mg Oral Daily   rosuvastatin  20 mg Oral QHS   sodium chloride flush  3 mL Intravenous Q12H   Continuous Infusions:  sodium chloride      Nutritional status     Body mass index is 30.6 kg/m.  Data Reviewed:   CBC: Recent Labs  Lab 08/26/22 1656 08/28/22 0437 08/29/22 0527 08/30/22 0512 08/31/22 0449 09/01/22 0505  WBC 6.9 6.5 6.6 6.3 5.9 6.4  NEUTROABS 5.2  --   --   --   --   --   HGB 13.8 12.7* 13.1 12.2* 13.0 13.2  HCT 42.8 40.4 41.7 39.0 41.4 42.5  MCV 92.4 95.1 93.3 94.7 93.5 94.9  PLT 154 160 143* 144* 145* 143*   Basic Metabolic Panel: Recent Labs  Lab 08/28/22 0437 08/29/22 0527 08/30/22 0512 08/31/22 0449 09/01/22 0505  NA 133* 138 136 135 136  K 3.9 4.5 3.8 4.7 4.9  CL 97* 101 100 100 100  CO2 25 28 26 25 27   GLUCOSE 97 86 95 94 108*  BUN 16 17 20 18 23   CREATININE 1.71* 1.65* 1.58* 1.54* 1.62*  CALCIUM 8.7* 8.9 8.4* 8.6* 8.4*  MG 2.6* 2.6* 2.4 2.4 2.5*  PHOS 4.3  --   --   --   --    GFR: Estimated Creatinine Clearance: 51 mL/min (A) (by C-G formula based on SCr of 1.62 mg/dL (H)). Liver Function Tests: Recent Labs  Lab 08/26/22 1656  AST 26  ALT 12  ALKPHOS 62  BILITOT 1.3*  PROT 6.2*  ALBUMIN 3.3*   No results for input(s): "LIPASE", "AMYLASE" in the last 168 hours. No results for input(s): "AMMONIA" in the last 168 hours. Coagulation Profile: No results for input(s): "INR", "PROTIME" in the last 168 hours. Cardiac Enzymes: No results for input(s): "CKTOTAL", "CKMB", "CKMBINDEX", "TROPONINI" in the last 168 hours. BNP (last 3 results) No results for  input(s): "PROBNP" in the last 8760 hours. HbA1C: No results for input(s): "HGBA1C" in the last 72 hours. CBG: No results for input(s): "GLUCAP" in the last 168 hours. Lipid Profile: No results for input(s): "CHOL", "HDL", "LDLCALC", "TRIG", "CHOLHDL", "LDLDIRECT" in the last 72 hours. Thyroid Function Tests: No results for input(s): "TSH", "T4TOTAL", "FREET4", "T3FREE", "THYROIDAB" in the  last 72 hours. Anemia Panel: No results for input(s): "VITAMINB12", "FOLATE", "FERRITIN", "TIBC", "IRON", "RETICCTPCT" in the last 72 hours. Sepsis Labs: Recent Labs  Lab 08/29/22 0925  LATICACIDVEN 1.4    Recent Results (from the past 240 hour(s))  SARS Coronavirus 2 by RT PCR (hospital order, performed in [facility] hospital lab) *cepheid single result test* Anterior Nasal Swab     Status: None   Collection Time: 08/26/22  4:38 PM   Specimen: Anterior Nasal Swab  Result Value Ref Range Status   SARS Coronavirus 2 by RT PCR NEGATIVE NEGATIVE Final    Comment: (NOTE) SARS-CoV-2 target nucleic acids are NOT DETECTED.  The SARS-CoV-2 RNA is generally detectable in upper and lower respiratory specimens during the acute phase of infection. The lowest concentration of SARS-CoV-2 viral copies this assay can detect is 250 copies / mL. A negative result does not preclude SARS-CoV-2 infection and should not be used as the sole basis for treatment or other patient management decisions.  A negative result may occur with improper specimen collection / handling, submission of specimen other than nasopharyngeal swab, presence of viral mutation(s) within the areas targeted by this assay, and inadequate number of viral copies (<250 copies / mL). A negative result must be combined with clinical observations, patient history, and epidemiological information.  Fact Sheet for Patients:   RoadLapTop.co.za  Fact Sheet for Healthcare  Providers: http://kim-miller.com/  This test is not yet approved or  cleared by the [place] FDA and has been authorized for detection and/or diagnosis of SARS-CoV-2 by FDA under an Emergency Use Authorization (EUA).  This EUA will remain in effect (meaning this test can be used) for the duration of the COVID-19 declaration under Section 564(b)(1) of the Act, 21 U.S.C. section 360bbb-3(b)(1), unless the authorization is terminated or revoked sooner.  Performed at [facility], [address] [place], [place] [address]          Radiology Studies: No results found.         LOS: 5 days   Time spent= 35 mins    Miguel Rota, MD [facility]  If 7PM-7AM, please contact night-coverage  09/01/2022, 1:32 PM

## 2022-09-01 NOTE — Progress Notes (Signed)
Heart Failure Nurse Navigator Progress Note  PCP: [facility] PCP-Cardiologist: None Admission Diagnosis: Acute on chronic congestive heart failure, Hypoxia Admitted from: Home  Presentation:   Max Rivas presented with shortness of breath, worsening over last 2 days, baseline 5 L was desat into 60"s. Trace edema bilateral legs, BNP 684, IV lasix given,   Patient was educated via cell phone at [facility] on the sign and symptoms of heart failure, daily weights, when to call his doctor or go to the ED, diet/ fluid restrictions, taking all medications as prescribed and attending all doctor appointments, patient verbalized his understanding, a HF TOC appointment was scheduled for 09/09/2022 @ 2 pm per Dr. Antoine Poche suggestion.   ECHO/ LVEF: 25-30%  Clinical Course:  Past Medical History:  Diagnosis Date   Arthritis    "elbows, knees" (02/24/2016)   Bronchitis 2006   CAD (coronary artery disease)    a. s/p prior MIs - 1995 x 2, 1998; b. s/p prior LCX stenting; c. 04/2019 Cath: LM min irregs, LAD 65p/mi, D1 75, LCX 99ost/p, 15m (ISR), OM2 80, RCA/RPDA mod diff dzs; d. 05/2019 CABG x 3: LIMA->LAD, VG->Diag, VG->OM.   COPD (chronic obstructive pulmonary disease) (HCC)    a. Remote tobacco-->on home O2.   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 05/2019 Echo: EF 40-45%, gr2 DD. Nl RV size/fxn. Mildly dil LA. Mild MR.   High cholesterol    Ischemic cardiomyopathy    a. 05/2019 Echo: EF 40-45%.   Morbid obesity (HCC)    Myocardial infarction (HCC) ~ 1995 X 2;1998; 2000; 2004   On home oxygen therapy    "2L w/activity" (02/24/2016)   PAF (paroxysmal atrial fibrillation) (HCC)    a. CHA2DS2VASc = 4-->eliquis. Also on amio.   Pulmonary embolism (HCC) 02/24/2016   Sleep apnea    "have CPAP; can't tolerate it" (02/24/2016)   TIA (transient ischemic attack) 06/2021   Ventricular tachycardia (HCC)    a. 2005 s/p  ICD; b. 03/2011 Device upgrade/lead exchange: MDT Protecta XT VR single lead AICD.     Social History   Socioeconomic History   Marital status: Divorced    Spouse name: Not on file   Number of children: 1   Years of education: Not on file   Highest education level: High school graduate  Occupational History   Occupation: Retired  Tobacco Use   Smoking status: Former    Current packs/day: 0.00    Average packs/day: 3.0 packs/day for 35.0 years (105.0 ttl pk-yrs)    Types: Cigarettes    Start date: 05/21/1984    Quit date: 05/22/2019    Years since quitting: 3.2   Smokeless tobacco: Former    Quit date: 05/21/2017  Vaping Use   Vaping status: Never Used  Substance and Sexual Activity   Alcohol use: Not Currently   Drug use: Not Currently    Types: Cocaine    Comment: none since 1995   Sexual activity: Not Currently  Other Topics Concern   Not on file  Social History Narrative   Lives with room mate   Social Determinants of Health   Financial Resource Strain: Low Risk  (09/01/2022)   Overall Financial Resource Strain (CARDIA)    Difficulty of Paying Living Expenses: Not very hard  Food Insecurity: No Food Insecurity (09/01/2022)   Hunger Vital Sign    Worried About Running Out of Food in the Last Year:  Never true    Ran Out of Food in the Last Year: Never true  Transportation Needs: No Transportation Needs (09/01/2022)   PRAPARE - Administrator, Civil Service (Medical): No    Lack of Transportation (Non-Medical): No  Physical Activity: Not on file  Stress: Not on file  Social Connections: Unknown (05/23/2021)   Received from [facility]   Social Network    Social Network: Not on file   Education Assessment and Provision:  Detailed education and instructions provided on heart failure disease management including the following:  Signs and symptoms of Heart Failure When to call the physician Importance of daily weights Low sodium diet Fluid  restriction Medication management Anticipated future follow-up appointments  Patient education given on each of the above topics.  Patient acknowledges understanding via teach back method and acceptance of all instructions.  Education Materials:  "Living Better With Heart Failure" Booklet, HF zone tool, & Daily Weight Tracker Tool.  Patient has scale at home: Yes Patient has pill box at home: Yes    High Risk Criteria for Readmission and/or Poor Patient Outcomes: Heart failure hospital admissions (last 6 months): 1  No Show rate: 8% Difficult social situation: No, lives with roommate Demonstrates medication adherence: yes Primary Language: English Literacy level: Reading, writing, and comprehension  Barriers of Care:   Diet/ fluids ( salt , dining out)   Considerations/Referrals:   Referral made to Heart Failure Pharmacist Stewardship: No Referral made to Heart Failure CSW/NCM TOC: Yes, for additional assistance in home ( Aide)  Referral made to Heart & Vascular TOC clinic: Yes, 09/09/2022 @ 2 pm  Items for Follow-up on DC/TOC: Continued HF education Diet/ fluid restrictions (dining out)   Rhae Hammock, BSN, Scientist, clinical (histocompatibility and immunogenetics) Only

## 2022-09-01 NOTE — Plan of Care (Signed)

## 2022-09-01 NOTE — TOC Progression Note (Signed)
Transition of Care [facility]) - Progression Note    Patient Details  Name: Max Rivas MRN: [id number] Date of Birth: May 02, 1950  [facility]) CM/SW Contact  Lariya Kinzie, Olegario Messier, RN Phone Number: 09/01/2022, 3:59 PM  Clinical Narrative:  Spoke to patient about HHC benefit & services(intermittent skilled care) Has rw,ambulates ok, states he knows when to take his meds, he understands his diagnosis, & care. Informed that aide only provides services under HHC skilled care-currently he does not need any HHC services,therefore aide would not be covered. Custodial level services is an out of pocket cost-patient voiced understanding & states he can do for himself.  Christoper Allegra dme will provide travel tank to rm once 02 sats documented, & ordered. Has own transport home.    Expected Discharge Plan: Home/Self Care Barriers to Discharge: Continued Medical Work up  Expected Discharge Plan and Services   Discharge Planning Services: CM Consult Post Acute Care Choice: Resumption of Svcs/PTA Provider [facility] 02.) Living arrangements for the past 2 months: Apartment                                       Social Determinants of Health (SDOH) Interventions SDOH Screenings   Food Insecurity: No Food Insecurity (09/01/2022)  Housing: Low Risk  (09/01/2022)  Transportation Needs: No Transportation Needs (09/01/2022)  Utilities: Not At Risk (08/27/2022)  Alcohol Screen: Low Risk  (09/01/2022)  Financial Resource Strain: Low Risk  (09/01/2022)  Social Connections: Unknown (05/23/2021)   Received from [facility]  Tobacco Use: Medium Risk (08/27/2022)    Readmission Risk Interventions    06/25/2021    9:40 AM 04/28/2021   10:57 AM  Readmission Risk Prevention Plan  Transportation Screening Complete Complete  PCP or Specialist Appt within 3-5 Days Complete Complete  HRI or Home Care Consult Complete Complete  Social Work Consult for Recovery Care Planning/Counseling Complete Complete   Palliative Care Screening Not Applicable Not Applicable  Medication Review Oceanographer) Complete Complete

## 2022-09-01 NOTE — Care Management Important Message (Signed)
Important Message  Patient Details IM Letter given. Name: Max Rivas MRN: [id number] Date of Birth: [birth year]   Medicare Important Message Given:  Yes     Caren Macadam 09/01/2022, 11:02 AM

## 2022-09-01 NOTE — Progress Notes (Addendum)
Patient Name: Max Rivas Date of Encounter: 09/01/2022 [facility] HeartCare Cardiologist: None   Interval Summary  .    Patient seen this morning prior to transport to CT scan. He reports that his breathing is about the same yesterday. He remains relatively comfortable when sitting still but has significant dyspnea/hypoxia with minimal ambulation. Denies other focal complaints.   Vital Signs .    Vitals:   09/01/22 0347 09/01/22 0348 09/01/22 0600 09/01/22 0826  BP: 103/76     Pulse: 66     Resp: 18     Temp: (!) 97.5 F (36.4 C)     TempSrc: Oral     SpO2: 92%   (!) 88%  Weight:  99.5 kg 99.5 kg   Height:        Intake/Output Summary (Last 24 hours) at 09/01/2022 0932 Last data filed at 08/31/2022 2100 Gross per 24 hour  Intake 840 ml  Output 525 ml  Net 315 ml      09/01/2022    6:00 AM 09/01/2022    3:48 AM 08/31/2022    5:10 AM  Last 3 Weights  Weight (lbs) 219 lb 6.4 oz 219 lb 5.7 oz 220 lb 0.3 oz  Weight (kg) 99.519 kg 99.5 kg 99.8 kg      Telemetry/ECG    Atrial fibrillation, rate controlled with isolated PVCs and one 4 beat run of NSVT - Personally Reviewed  Physical Exam .   GEN: No acute distress.   Neck: No JVD Cardiac: irregularly irregular, no murmurs, rubs, or gallops.  Respiratory: No crackles/rales. Bilateral bases diminished. GI: Soft, nontender, non-distended  MS: 1-2+ bilateral pitting edema to ankle  Assessment & Plan .     Acute on Chronic HFrEF  Biventricular heart failure   On admission, BNP elevated to 684. CXR showed cardiomegaly with central vascular congestion. Echocardiogram 8/15 showed EF 25-30%, severely reduced RV function, moderately elevated pulmonary artery systolic pressure.   On 8/19, patient remained on HFNC with dyspnea, orthopnea, LE edema, and BNP found to have increased to 829. Today, breathing about the same but patient now on standard nasal cannula at 6LPM. Renal function stable, would continue with IV lasix  60mg  BID with ongoing dyspnea. Home GDMT includes imdur 30, jardiance 10 mg, toprol 12.5. Jardiance held this admission. BP remains low, 100s/60-70s mmHg which patient says is not normal for him, possible that lasix is contributing. For now, do not believe that patient BP would tolerate MRA/ARB/Entresto. If lasix can be decreased, suspect that he might tolerate at least a low dose ARB.  Will plan for outpatient AHF clinic follow up.  CAD  Elevated Troponin  HLD   Patient with history of prior PCI and CABG in May of 2021. No chest pain this admission. High sensitivity troponin this admission 16->20->20->20.   Troponin elevation consistent with demand ischemia in setting of acute CHF and hypoxia.  Continue ASA with Eliquis given significant CAD history.  Continue Crestor 20mg . LDL 61 this admission, ideally <55.   History of VT S/p AICD  Patient has a long history of recurrent VT. Has had ablation and AICD in the past. Infrequent and brief NSVT on telemetry this admission.   On Amiodarone 400mg  BID through 8/23 for NSVT with transition to 200mg  daily on 8/24.  No recent ICD shocks Maintain K>4, Mg>2  PAF   Patient remains in rate controlled afib. No symptoms.  Continue Amiodarone as above. Continue Toprol XL 12.5mg  Continue Eliquis 5mg  BID  COPD   Home regimen per primary team.   For questions or updates, please contact [facility] Please consult [email or web address] for contact info under        Signed, Perlie Gold, PA-C   History and all data above reviewed.  Patient examined.  I agree with the findings as above. Increased dyspnea and O2 requirements.  He reports that he is having trouble urinating and feels like he has to strain.   The patient exam reveals COR:RRR  ,  Lungs: Decreased breath sounds  ,  Abd: Positive bowel sounds, no rebound no guarding, Ext Mild ankle edema  .  All available labs, radiology testing, previous records reviewed. Agree with documented  assessment and plan.   Acute systolic HF:  Increased dyspnea and he needs increased diuresis.  However, we need to do a bladder scan first and see if he needs a Foley.  If he deteriorates further I would suggest transfer to ICU.    Max Rivas  1:25 PM  09/01/2022

## 2022-09-02 DIAGNOSIS — J9621 Acute and chronic respiratory failure with hypoxia: Secondary | ICD-10-CM | POA: Diagnosis not present

## 2022-09-02 LAB — BASIC METABOLIC PANEL
Anion gap: 11 (ref 5–15)
BUN: 20 mg/dL (ref 8–23)
CO2: 27 mmol/L (ref 22–32)
Calcium: 8.8 mg/dL — ABNORMAL LOW (ref 8.9–10.3)
Chloride: 99 mmol/L (ref 98–111)
Creatinine, Ser: 1.53 mg/dL — ABNORMAL HIGH (ref 0.61–1.24)
GFR, Estimated: 49 mL/min — ABNORMAL LOW (ref >60)
Glucose, Bld: 90 mg/dL (ref 70–99)
Potassium: 4.6 mmol/L (ref 3.5–5.1)
Sodium: 137 mmol/L (ref 135–145)

## 2022-09-02 LAB — CBC
HCT: 41.1 % (ref 39.0–52.0)
Hemoglobin: 12.8 g/dL — ABNORMAL LOW (ref 13.0–17.0)
MCH: 29.3 pg (ref 26.0–34.0)
MCHC: 31.1 g/dL (ref 30.0–36.0)
MCV: 94.1 fL (ref 80.0–100.0)
Platelets: 157 10*3/uL (ref 150–400)
RBC: 4.37 MIL/uL (ref 4.22–5.81)
RDW: 15.4 % (ref 11.5–15.5)
WBC: 8.6 10*3/uL (ref 4.0–10.5)
nRBC: 0 % (ref 0.0–0.2)

## 2022-09-02 LAB — MAGNESIUM: Magnesium: 2.4 mg/dL (ref 1.7–2.4)

## 2022-09-02 MED ORDER — CHLORHEXIDINE GLUCONATE CLOTH 2 % EX PADS
6.0000 | MEDICATED_PAD | Freq: Every day | CUTANEOUS | Status: DC
  Administered 2022-09-02 – 2022-09-08 (×6): 6 via TOPICAL

## 2022-09-02 NOTE — Progress Notes (Signed)
PROGRESS NOTE    Max Rivas  AOZ:[id number] DOB: [birth year] DOA: 08/26/2022 PCP: [facility]     Brief Narrative:  72 year old with history of CAD status post MI with CABG, systolic CHF EF 35%, A-fib on Eliquis, chronic hypoxia on O2, COPD presents to the ED with worsening shortness of breath for the past 2 days.  He is admitted to the hospital with concerns of CHF exacerbation.  Chest x-ray showed cardiomegaly with central venous congestion/pulmonary edema.  Started on IV diuretics.     Assessment & Plan:  Principal Problem:   Acute on chronic hypoxic respiratory failure (HCC) Active Problems:   Hypoxia     Acute hypoxic respiratory distress Acute congestive heart failure with reduced ejection fraction, 30%.  Class III -Worsening hypoxia this morning on 8 L nasal cannula.  Will repeat chest x-ray.  Echocardiogram shows EF of 30%.  Currently on home medicine of Imdur, Toprol-XL.  Currently Jardiance is on hold, diuretics managed by cardiology team. - CT scan shows severe emphysematous disease with bilateral groundglass opacity.   Hypokalemia -As needed repletion   AKI on CKD stage II -Baseline creatinine 1.3, peaked at 1.83, slowly improving, this morning 1.62   History of CAD status post CABG -Currently chest pain-free.  Continue aspirin, statin, Imdur, Toprol-XL, will adjust for soft blood pressure while he is getting diuretics   History of atrial fibrillation, paroxysmal -On amiodarone, Toprol-XL and Eliquis   COPD -Continue home bronchodilators.  Also added as needed.  I-S/flutter CT scan showed severe emphysema with bilateral groundglass opacity.  Seen by pulmonary.  Unfortunately patient is at end-stage COPD and overall will require higher oxygen.  By pulmonary, palliative care team also consulted.   GERD -Protonix   History of mood disorder - On amitriptyline   Hypothyroidism - Synthroid       DVT prophylaxis: SCDs Start: 08/26/22  2258 apixaban (ELIQUIS) tablet 5 mg  Code Status:  Family Communication:   Ongoing management for fluid overload and respiratory distress               Diet Orders (From admission, onward)     Start     Ordered   08/27/22 0824  Diet heart healthy/carb modified Room service appropriate? Yes; Fluid consistency: Thin; Fluid restriction: 1800 mL Fluid  Diet effective now       Question Answer Comment  Diet-HS Snack? Nothing   Room service appropriate? Yes   Fluid consistency: Thin   Fluid restriction: 1800 mL Fluid      08/27/22 0824            Subjective: Sitting up in the recliner.  I explained him his advance pulmonary condition which she really understands and is willing to accept any help.  He is very appreciative of his care.   Examination:  General exam: Appears calm and comfortable  Respiratory system: Some bilateral rhonchi Cardiovascular system: S1 & S2 heard, RRR. No JVD, murmurs, rubs, gallops or clicks. No pedal edema. Gastrointestinal system: Abdomen is nondistended, soft and nontender. No organomegaly or masses felt. Normal bowel sounds heard. Central nervous system: Alert and oriented. No focal neurological deficits. Extremities: Symmetric 5 x 5 power. Skin: No rashes, lesions or ulcers Psychiatry: Judgement and insight appear normal. Mood & affect appropriate.  Objective: Vitals:   09/02/22 0557 09/02/22 0744 09/02/22 0804 09/02/22 0905  BP:    139/84  Pulse:    95  Resp:    17  Temp:    Marland Kitchen)  97.5 F (36.4 C)  TempSrc:    Oral  SpO2:  (!) 88% 94% 91%  Weight: 99.8 kg     Height:        Intake/Output Summary (Last 24 hours) at 09/02/2022 1153 Last data filed at 09/02/2022 1610 Gross per 24 hour  Intake 450 ml  Output 2275 ml  Net -1825 ml   Filed Weights   09/01/22 0348 09/01/22 0600 09/02/22 0557  Weight: 99.5 kg 99.5 kg 99.8 kg    Scheduled Meds:  amiodarone  400 mg Oral BID   Followed by   Melene Muller ON 09/05/2022] amiodarone  200  mg Oral Daily   amitriptyline  25 mg Oral QHS   apixaban  5 mg Oral BID   arformoterol  15 mcg Nebulization BID   aspirin EC  81 mg Oral Daily   budesonide  0.5 mg Nebulization BID   Chlorhexidine Gluconate Cloth  6 each Topical Daily   cholecalciferol  2,000 Units Oral Daily   cyclobenzaprine  10 mg Oral QPC breakfast   famotidine  20 mg Oral QHS   furosemide  80 mg Intravenous BID   gabapentin  200 mg Oral BID   levothyroxine  100 mcg Oral QAC breakfast   montelukast  10 mg Oral QHS   multivitamin  1 tablet Oral Daily   multivitamin with minerals  1 tablet Oral Q breakfast   pantoprazole  40 mg Oral BID   revefenacin  175 mcg Nebulization Daily   rosuvastatin  20 mg Oral QHS   sodium chloride flush  3 mL Intravenous Q12H   Continuous Infusions:  sodium chloride      Nutritional status     Body mass index is 30.69 kg/m.  Data Reviewed:   CBC: Recent Labs  Lab 08/26/22 1656 08/28/22 0437 08/29/22 0527 08/30/22 0512 08/31/22 0449 09/01/22 0505 09/02/22 0506  WBC 6.9   < > 6.6 6.3 5.9 6.4 8.6  NEUTROABS 5.2  --   --   --   --   --   --   HGB 13.8   < > 13.1 12.2* 13.0 13.2 12.8*  HCT 42.8   < > 41.7 39.0 41.4 42.5 41.1  MCV 92.4   < > 93.3 94.7 93.5 94.9 94.1  PLT 154   < > 143* 144* 145* 143* 157   < > = values in this interval not displayed.   Basic Metabolic Panel: Recent Labs  Lab 08/28/22 0437 08/29/22 0527 08/30/22 0512 08/31/22 0449 09/01/22 0505 09/02/22 0506  NA 133* 138 136 135 136 137  K 3.9 4.5 3.8 4.7 4.9 4.6  CL 97* 101 100 100 100 99  CO2 25 28 26 25 27 27   GLUCOSE 97 86 95 94 108* 90  BUN 16 17 20 18 23 20   CREATININE 1.71* 1.65* 1.58* 1.54* 1.62* 1.53*  CALCIUM 8.7* 8.9 8.4* 8.6* 8.4* 8.8*  MG 2.6* 2.6* 2.4 2.4 2.5* 2.4  PHOS 4.3  --   --   --   --   --    GFR: Estimated Creatinine Clearance: 54.1 mL/min (A) (by C-G formula based on SCr of 1.53 mg/dL (H)). Liver Function Tests: Recent Labs  Lab 08/26/22 1656  AST 26  ALT 12   ALKPHOS 62  BILITOT 1.3*  PROT 6.2*  ALBUMIN 3.3*   No results for input(s): "LIPASE", "AMYLASE" in the last 168 hours. No results for input(s): "AMMONIA" in the last 168 hours. Coagulation Profile: No results for input(s): "  INR", "PROTIME" in the last 168 hours. Cardiac Enzymes: No results for input(s): "CKTOTAL", "CKMB", "CKMBINDEX", "TROPONINI" in the last 168 hours. BNP (last 3 results) No results for input(s): "PROBNP" in the last 8760 hours. HbA1C: No results for input(s): "HGBA1C" in the last 72 hours. CBG: No results for input(s): "GLUCAP" in the last 168 hours. Lipid Profile: No results for input(s): "CHOL", "HDL", "LDLCALC", "TRIG", "CHOLHDL", "LDLDIRECT" in the last 72 hours. Thyroid Function Tests: No results for input(s): "TSH", "T4TOTAL", "FREET4", "T3FREE", "THYROIDAB" in the last 72 hours. Anemia Panel: No results for input(s): "VITAMINB12", "FOLATE", "FERRITIN", "TIBC", "IRON", "RETICCTPCT" in the last 72 hours. Sepsis Labs: Recent Labs  Lab 08/29/22 0925  LATICACIDVEN 1.4    Recent Results (from the past 240 hour(s))  SARS Coronavirus 2 by RT PCR (hospital order, performed in [facility] hospital lab) *cepheid single result test* Anterior Nasal Swab     Status: None   Collection Time: 08/26/22  4:38 PM   Specimen: Anterior Nasal Swab  Result Value Ref Range Status   SARS Coronavirus 2 by RT PCR NEGATIVE NEGATIVE Final    Comment: (NOTE) SARS-CoV-2 target nucleic acids are NOT DETECTED.  The SARS-CoV-2 RNA is generally detectable in upper and lower respiratory specimens during the acute phase of infection. The lowest concentration of SARS-CoV-2 viral copies this assay can detect is 250 copies / mL. A negative result does not preclude SARS-CoV-2 infection and should not be used as the sole basis for treatment or other patient management decisions.  A negative result may occur with improper specimen collection / handling, submission of specimen  other than nasopharyngeal swab, presence of viral mutation(s) within the areas targeted by this assay, and inadequate number of viral copies (<250 copies / mL). A negative result must be combined with clinical observations, patient history, and epidemiological information.  Fact Sheet for Patients:   RoadLapTop.co.za  Fact Sheet for Healthcare Providers: http://kim-miller.com/  This test is not yet approved or  cleared by the [place] FDA and has been authorized for detection and/or diagnosis of SARS-CoV-2 by FDA under an Emergency Use Authorization (EUA).  This EUA will remain in effect (meaning this test can be used) for the duration of the COVID-19 declaration under Section 564(b)(1) of the Act, 21 U.S.C. section 360bbb-3(b)(1), unless the authorization is terminated or revoked sooner.  Performed at [facility], [address] [place], [place] [address]          Radiology Studies: CT CHEST WO CONTRAST  Result Date: 09/01/2022 CLINICAL DATA:  Interstitial lung disease EXAM: CT CHEST WITHOUT CONTRAST TECHNIQUE: Multidetector CT imaging of the chest was performed following the standard protocol without IV contrast. RADIATION DOSE REDUCTION: This exam was performed according to the departmental dose-optimization program which includes automated exposure control, adjustment of the mA and/or kV according to patient size and/or use of iterative reconstruction technique. COMPARISON:  Chest CT dated June 28th 2024 FINDINGS: Cardiovascular: Cardiomegaly. No pericardial effusion. Normal caliber thoracic aorta with moderate calcified plaque. Severe left main and three-vessel coronary artery calcifications status post CABG. Left chest wall ICD with leads in the right atrium. Calcifications of the lateral wall of the left ventricle, likely sequela of prior infarct. Mediastinum/Nodes: Esophagus and thyroid are unremarkable. No  enlarged lymph nodes seen in the chest. Lungs/Pleura: Central airways are patent. Severe centrilobular emphysema. Mild diffuse ground-glass opacities, similar to prior exam. Calcified right pleural plaques, likely sequela of prior hemothorax or empyema. Rounded subpleural opacities of the right  lower, unchanged when compared with the prior exam lobe. Stable small left pleural effusion. No traction bronchiectasis or honeycomb change. Upper Abdomen: No acute abnormality. Musculoskeletal: No chest wall mass or suspicious bone lesions identified. IMPRESSION: 1. No evidence of interstitial lung disease. 2. Mild diffuse ground-glass opacities, likely due to pulmonary edema. 3. Stable small left pleural effusion. 4. Calcified right pleural plaques, likely sequela of prior empyema or hemothorax. 5. Severe aortic Atherosclerosis (ICD10-I70.0) and Emphysema (ICD10-J43.9). Electronically Signed   By: Allegra Lai M.D.   On: 09/01/2022 14:47           LOS: 6 days   Time spent= 35 mins    Miguel Rota, MD [facility]  If 7PM-7AM, please contact night-coverage  09/02/2022, 11:53 AM

## 2022-09-02 NOTE — TOC Progression Note (Signed)
Transition of Care [facility]) - Progression Note    Patient Details  Name: JAVEN BALDASSARE MRN: [id number] Date of Birth: Sep 27, 1950  Transition of Care [facility]) CM/SW Contact  Ceylin Dreibelbis, Olegario Messier, RN Phone Number: 09/02/2022, 9:57 AM  Clinical Narrative:  Referral for oxymizer @ d/c. Confirmed w/[employer] patient's current home 02 provider rep Tanya-last order July 2024-currently receives High liter flow concentrator @ 5l home 02.For d/c will need  will need 02 sats documented within 48hrs of d/c, oxymizer order w/liter flow, & duration, home 02 order w/liter flow,duration. They can bring dme-home 02 travel tank, & oxymizer to patient's rm prior d/c.    Expected Discharge Plan: Home/Self Care Barriers to Discharge: Continued Medical Work up  Expected Discharge Plan and Services   Discharge Planning Services: CM Consult Post Acute Care Choice: Resumption of Svcs/PTA Provider [facility] 02.) Living arrangements for the past 2 months: Apartment                                       Social Determinants of Health (SDOH) Interventions SDOH Screenings   Food Insecurity: No Food Insecurity (09/01/2022)  Housing: Low Risk  (09/01/2022)  Transportation Needs: No Transportation Needs (09/01/2022)  Utilities: Not At Risk (08/27/2022)  Alcohol Screen: Low Risk  (09/01/2022)  Financial Resource Strain: Low Risk  (09/01/2022)  Social Connections: Unknown (05/23/2021)   Received from [facility]  Tobacco Use: Medium Risk (08/27/2022)    Readmission Risk Interventions    06/25/2021    9:40 AM 04/28/2021   10:57 AM  Readmission Risk Prevention Plan  Transportation Screening Complete Complete  PCP or Specialist Appt within 3-5 Days Complete Complete  HRI or Home Care Consult Complete Complete  Social Work Consult for Recovery Care Planning/Counseling Complete Complete  Palliative Care Screening Not Applicable Not Applicable  Medication Review Oceanographer) Complete Complete

## 2022-09-02 NOTE — Consult Note (Addendum)
NAME:  Max Rivas, MRN:  [id number], DOB:  23-Jan-1950, LOS: 6 ADMISSION DATE:  08/26/2022, CONSULTATION DATE:  8/20 REFERRING MD:  Nelson Chimes, CHIEF COMPLAINT:  hypoxia    History of Present Illness:  This is a 72 year old male w/ extensive cardiopulmonary hx as listed below. Last seem in our office by Dr Delton Coombes back in 2020 at that time was still active smoker and was being maintained on Stiolto, Asmanex and PRN xopenex. He has been O2 dependent since 2021 (initial 4 liters pulse to cont 5 liters since June 2024).  Carries a dx of OSA, (CPAP intolerant w/ several failed attempts)  Also has had complicated cardiac hx w/ several admits for VT now has AICD. Admitted 8/14 w/ cc: 2 d h/o shortness of breath, NP cough and 3 lb wt gain. His initial PCXR was c/w pulmonary edema and also showed chronic calcified right pleural thickening w/ pleural plaque. His COVID was negative, PCT negative, trop I negative, BNP 684, pulse ox 50s on 5 lpm. Supplemental oxygen was titrated up, he was administered IV lasix, and admitted w/ working dx of acute on chronic hypoxic resp failure 2/2 acute on chronic HF w/ pulmonary edema.  Hospital course 8/14 admitted. Escalated O2 and lasix 8/15 worsening renal fxn. Lasix held. Breathing a little better.  ECHO EF 25-30% w/ global hypokinesis and severely decreased RV systolic fxn. Also mod elevated PAS w/ RA and LA dilation, (reflected decreased EF from June 2024, and RV was mod reduced at that point)  8/16 sats down to 81% on 8 lpm when ambulating.  8/17 cards consulted. Recommended aggressive diuresis in spite of rising scr. BP soft side so recommended holding Jardiance, ACE/ARB/ARNI/MRA 8/18 feeling better. Still w/ exertional dyspnea  8/19 feeling about same as 8/18 still had orthopnea and LE edema -2.9 liters. Again plan was to continue IV diuresis for acute biventricular HF.  Got CT chest that showed small left effusion, prob edema and left subseg atx, calcified pleural plaque  on right hemithorax, severe emphysema changes and prior CABG changes.  As of 8/20 he remains on 8 lpm  still gets severely short of breath w/ activity and from a cardiac stand-point he is on lasix 60mg  bid,  lopressor and imdur. Because of his sig exertional dyspnea and hypoxia we were asked to evaluate.   Pertinent  Medical History  CAD w/ Prior MI, prior CABG, HFrEF (35%), afib on DOAC, Chronic hypoxic resp failure. 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 Prior TIA, OSA  Significant Hospital Events: Including procedures, antibiotic start and stop dates in addition to other pertinent events   8/14 admitted w/ acute HF and pulm edema  8/20 Pulm asked to see for progressive hypoxia.   Interim History / Subjective:  Sitting in chair. Feels breathing is better today Remains on 6L with nadir SpO2 88% Has not received oximeter  Objective   Blood pressure 90/62, pulse 76, temperature (!) 97.4 F (36.3 C), temperature source Oral, resp. rate 18, height 5\' 11"  (1.803 m), weight 99.8 kg, SpO2 (!) 88%.        Intake/Output Summary (Last 24 hours) at 09/02/2022 0901 Last data filed at 09/02/2022 0742 Gross per 24 hour  Intake 450 ml  Output 2275 ml  Net -1825 ml   Filed Weights   09/01/22 0348 09/01/22 0600 09/02/22  0557  Weight: 99.5 kg 99.5 kg 99.8 kg   Physical Exam: General: Chronically ill-appearing, no acute distress HENT: [place], AT, OP clear, MMM Eyes: EOMI, no scleral icterus Respiratory: Improved bibasilar crackles, diminished air entry in the upper lobes Cardiovascular: RRR, -M/R/G, no JVD Extremities: +LE edema,-tenderness Neuro: AAO x4, CNII-XII grossly intact Psych: Normal mood, normal affect  CT Chest 09/01/22 - Severe emphysema, bilateral diffuse ground glass opacities, calcified right pleural plaques,  rounded subpleural opacities, small left pleural effusion   Slightly improving AKI with Cr 1.53  Resolved Hospital Problem list     Assessment & Plan:   Acute on chronic hypoxemic respiratory failure 2/2 pulmonary edema Advanced/end stage COPD - unable to tolerate NIV Acute on chronic biventricular heart failure Secondary pulmonary hypertension OSA not on CPAP Asbestos related pleural calcification  Counseled patient on limited options from a pulmonary standpoint given his advanced lung disease. He would benefit from NIV however unable to tolerate per patient. Discussed plans to optimize inhaler regimen with nebulizers and continue diuresis for his heart failure but if this fails and his medical issues continue to progress, would recommend hospice. Patient is amenable to this idea if he remains status quo --Agree with diuresis --Wean O2 for goal >88-92%. Communicated with staff regarding oximizer as his ongoing O2 needs are suspected to range from 6 at rest and possibly >10L with exertion --Continue nebulizer regimen --OOB, sitting in chair, PT/OT, IS --Palliative consulted   Pulmonary will intermittently follow  Best Practice (right click and "Reselect all SmartList Selections" daily)   Per primary    Critical care time: NA     Mechele Collin, M.D. [facility] Pulmonary/Critical Care Medicine 09/02/2022 9:01 AM   See Amion for personal pager For hours between 7 PM to 7 AM, please call Elink for urgent questions

## 2022-09-02 NOTE — Plan of Care (Signed)
Monitor labs and vital signs. Monitor intake and output. Continuous pulse oximetry. Monitor oxygen requirements; wean as tolerated. Continue current plan of care.

## 2022-09-02 NOTE — Evaluation (Signed)
Clinical/Bedside Swallow Evaluation Patient Details  Name: RIGGIN MORIMOTO MRN: [id number] Date of Birth: 1950-07-12  Today's Date: 09/02/2022 Time: SLP Start Time (ACUTE ONLY): 1115 SLP Stop Time (ACUTE ONLY): 1135 SLP Time Calculation (min) (ACUTE ONLY): 20 min  Past Medical History:  Past Medical History:  Diagnosis Date   Arthritis    "elbows, knees" (02/24/2016)   Bronchitis 2006   CAD (coronary artery disease)    a. s/p prior MIs - 1995 x 2, 1998; b. s/p prior LCX stenting; c. 04/2019 Cath: LM min irregs, LAD 65p/mi, D1 75, LCX 99ost/p, 6m (ISR), OM2 80, RCA/RPDA mod diff dzs; d. 05/2019 CABG x 3: LIMA->LAD, VG->Diag, VG->OM.   COPD (chronic obstructive pulmonary disease) (HCC)    a. Remote tobacco-->on home O2.   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 05/2019 Echo: EF 40-45%, gr2 DD. Nl RV size/fxn. Mildly dil LA. Mild MR.   High cholesterol    Ischemic cardiomyopathy    a. 05/2019 Echo: EF 40-45%.   Morbid obesity (HCC)    Myocardial infarction (HCC) ~ 1995 X 2;1998; 2000; 2004   On home oxygen therapy    "2L w/activity" (02/24/2016)   PAF (paroxysmal atrial fibrillation) (HCC)    a. CHA2DS2VASc = 4-->eliquis. Also on amio.   Pulmonary embolism (HCC) 02/24/2016   Sleep apnea    "have CPAP; can't tolerate it" (02/24/2016)   TIA (transient ischemic attack) 06/2021   Ventricular tachycardia (HCC)    a. 2005 s/p ICD; b. 03/2011 Device upgrade/lead exchange: MDT Protecta XT VR single lead AICD.   Past Surgical History:  Past Surgical History:  Procedure Laterality Date   CARDIOVERSION N/A 09/11/2019   Procedure: CARDIOVERSION;  Surgeon: Jodelle Red, MD;  Location: [facility] ENDOSCOPY;  Service: Cardiovascular;  Laterality: N/A;   CLIPPING OF ATRIAL APPENDAGE N/A 05/23/2019   Procedure: CLIPPING OF ATRIAL APPENDAGE with atriclip;  Surgeon: [name], MD;  Location: [facility] OR;  Service: Open Heart Surgery;   Laterality: N/A;   CORONARY ANGIOPLASTY  1995   CORONARY ANGIOPLASTY WITH STENT PLACEMENT  ~ 1995 - 2004 X 5   "I've got a total of 5 stents" (02/24/2016)   CORONARY ARTERY BYPASS GRAFT N/A 05/23/2019   Procedure: CORONARY ARTERY BYPASS GRAFTING (CABG), times three, using right greater saphenous vein and left internal mammary;  Surgeon: [name], MD;  Location: [facility] OR;  Service: Open Heart Surgery;  Laterality: N/A;  [place] only   CORONARY PRESSURE/FFR STUDY N/A 05/12/2019   Procedure: INTRAVASCULAR PRESSURE WIRE/FFR STUDY;  Surgeon: Lyn Records, MD;  Location: [facility] INVASIVE CV LAB;  Service: Cardiovascular;  Laterality: N/A;   heart ablation  2022   INSERT / REPLACE / REMOVE PACEMAKER  07/2003   original PPM around 2004 for ICM with EF < 35%   LEFT HEART CATH AND CORONARY ANGIOGRAPHY N/A 05/12/2019   Procedure: LEFT HEART CATH AND CORONARY ANGIOGRAPHY;  Surgeon: Lyn Records, MD;  Location: [facility] INVASIVE CV LAB;  Service: Cardiovascular;  Laterality: N/A;   PACEMAKER GENERATOR CHANGE  03/2011    [facility]   TEE WITHOUT CARDIOVERSION N/A 05/23/2019   Procedure: TRANSESOPHAGEAL ECHOCARDIOGRAM (TEE);  Surgeon: [name], MD;  Location: [facility] OR;  Service: Open Heart Surgery;  Laterality: N/A;   TONSILLECTOMY     HPI:  72 yo male adm with respiratory difficulties.  Pt with PMH + for CHF, Afib, TIA, GERD, lung mass, tobacco  abuse, COPD, asbestos related calcification, chronic heart failure, OSA and not on CPAP.  Prior brain imaging showed Small chronic right posterior parietal infarct..  Cardiology note indicates pt remains with dyspnea with ambulating.   Patient's chest CT 09/01/22 - Severe emphysema, bilateral diffuse ground glass opacities, calcified right pleural plaques, rounded subpleural opacities, small left pleural effusion.  He is currently requiring a 6 L of oxygen with 88% oxygen saturation.  Critical care service has seen him and recommended I-S and respiratory treatments and  consider hospice if he does not improve.  Hospitalist ordered a swallow evaluation.    Assessment / Plan / Recommendation  Clinical Impression  Normal oropharyngeal swallow ability based on clinical evaluation.  Patient easily passed 3 ounce Yale water challenge and was observed to consume entire container of applesauce and 4 graham crackers without clinical indication of aspiration or dysphagia.  Adequate mastication and oral clearance noted and swallow and respiratory reciprocityintafct with exhalation post swallow.  Patient admits to occasional issues with sensing pills sticking in his throat causing him to propel in the mouth and reswallow.  Discussed with patient's options for taking medications if he has ongoing issues including assuring to always start all intake with liquids and take medications with pure or solid.  He also endorses history of reflux for which he takes a PPI since 2007.  Denies reflux being worse now than baseline and tries to eat a reflux prudent diet.  Recommend patient continue diet as tolerated with general aspiration and esophageal precautions.  He was educated using teach back and no follow-up needed SLP Visit Diagnosis: Dysphagia, unspecified (R13.10)    Aspiration Risk  Mild aspiration risk    Diet Recommendation Regular;Thin liquid    Liquid Administration via: Cup;Straw Medication Administration: Whole meds with liquid Supervision: Patient able to self feed Compensations: Slow rate;Small sips/bites (Start all intake with liquids) Postural Changes: Seated upright at 90 degrees;Remain upright for at least 30 minutes after po intake    Other  Recommendations Oral Care Recommendations: Oral care BID    Recommendations for follow up therapy are one component of a multi-disciplinary discharge planning process, led by the attending physician.  Recommendations may be updated based on patient status, additional functional criteria and insurance authorization.  Follow  up Recommendations No SLP follow up      Assistance Recommended at Discharge  N/a  Functional Status Assessment Patient has not had a recent decline in their functional status  Frequency and Duration     N/a       Prognosis   N/a     Swallow Study   General Date of Onset: 09/01/22 HPI: 71 yo male adm with respiratory difficulties.  Pt with PMH + for CHF, Afib, TIA, GERD, lung mass, tobacco abuse, COPD, asbestos related calcification, chronic heart failure, OSA and not on CPAP.  Prior brain imaging showed Small chronic right posterior parietal infarct..  Cardiology note indicates pt remains with dyspnea with ambulating.   Patient's chest CT 09/01/22 - Severe emphysema, bilateral diffuse ground glass opacities, calcified right pleural plaques, rounded subpleural opacities, small left pleural effusion.  He is currently requiring a 6 L of oxygen with 88% oxygen saturation.  Critical care service has seen him and recommended I-S and respiratory treatments and consider hospice if he does not improve.  Hospitalist ordered a swallow evaluation. Diet Prior to this Study: Regular;Thin liquids (Level 0) Respiratory Status: Room air History of Recent Intubation: No Behavior/Cognition: Alert;Cooperative;Pleasant mood Oral Care Completed by  SLP: Recent completion by staff Oral Cavity - Dentition: Edentulous Vision: Functional for self-feeding Baseline Vocal Quality: Normal Volitional Cough: Strong Volitional Swallow: Able to elicit    Oral/Motor/Sensory Function Overall Oral Motor/Sensory Function: Within functional limits   Ice Chips Ice chips: Not tested   Thin Liquid Thin Liquid: Within functional limits    Nectar Thick Nectar Thick Liquid: Not tested   Honey Thick Honey Thick Liquid: Not tested   Puree Puree: Within functional limits Presentation: Self Fed;Spoon   Solid     Solid: Within functional limits Presentation: Self Fed;Spoon      Chales Abrahams 09/02/2022,11:48  AM   Rolena Infante, MS [facility] SLP Acute Rehab Services Office [phone]

## 2022-09-02 NOTE — Progress Notes (Signed)
Assumed care of pt from off going RN. No changes in initial am assessment at this time. Cont with plan of care

## 2022-09-02 NOTE — Progress Notes (Addendum)
Patient Name: Max Rivas Date of Encounter: 09/02/2022 [facility] Cardiologist: None   Interval Summary  .    Patient remains very dyspneic this morning, required up-titration of O2 after moving from the bed to the chair. He's had somewhat labile BP this morning, not currently feeling dizzy or lightheaded. Denies palpitations, chest pain. Overall is discouraged by lack of improvement in symptoms.   Vital Signs .    Vitals:   09/01/22 2105 09/02/22 0428 09/02/22 0557 09/02/22 0744  BP:  90/62    Pulse:  76    Resp:  18    Temp:  (!) 97.4 F (36.3 C)    TempSrc:  Oral    SpO2: 95% 95%  (!) 88%  Weight:   99.8 kg   Height:        Intake/Output Summary (Last 24 hours) at 09/02/2022 0814 Last data filed at 09/02/2022 1610 Gross per 24 hour  Intake 690 ml  Output 2575 ml  Net -1885 ml      09/02/2022    5:57 AM 09/01/2022    6:00 AM 09/01/2022    3:48 AM  Last 3 Weights  Weight (lbs) 220 lb 0.3 oz 219 lb 6.4 oz 219 lb 5.7 oz  Weight (kg) 99.8 kg 99.519 kg 99.5 kg      Telemetry/ECG    Persistent atrial fibrillation with ventricular rates generally controlled and less than 100bpm. - Personally Reviewed  Physical Exam .   GEN: No acute distress.   Neck: No obvious elevation of JVP Cardiac: irregularly irregular, no murmurs, rubs, or gallops.  Respiratory: Bilateral bases diminished but improved aeration from yesterday GI: Soft, nontender, non-distended  MS: minimal lower extremity edema to ankles bilaterally  Assessment & Plan .     Acute on Chronic HFrEF  Biventricular heart failure  Hypoxic respiratory failure   On admission, BNP elevated to 684. CXR showed cardiomegaly with central vascular congestion. Echocardiogram 8/15 showed EF 25-30%, severely reduced RV function, moderately elevated pulmonary artery systolic pressure.    On 8/19, patient remained on HFNC with dyspnea, orthopnea, LE edema, and BNP found to have increased to 829.  On 8/20,  significant dyspnea and hypoxia persisted on [place] at 8LPM. PCCM was asked to see patient by primary team given hypoxia. CT chest revealed severe emphysema, bilateral diffuse ground glass opacities, calcified right pleural plaques, rounded subpleural opacities, small left pleural effusion. Overall difficult situation with palliative involvement recommended by PCCM. Patient also with urinary retention prompting placement of foley catheter. Today 8/21, patient remains very short of breath despite ongoing diuresis. BP low at 90/62 mmHg earlier this morning. Improved to 139/84 and on exam, patient is warm and well perfused. Will stop both Imdur and Toprol until BP trend is more stable. BP unable to tolerate MRA/ARB/Entresto at this time. Continue with IV lasix today. Will plan for outpatient AHF clinic follow up. Agree with palliative care involvement.   CAD  Elevated Troponin  HLD    Patient with history of prior PCI and CABG in May of 2021. No chest pain this admission. High sensitivity troponin this admission 16->20->20->20.    Troponin elevation consistent with demand ischemia in setting of acute CHF and hypoxia.  Continue ASA with Eliquis given significant CAD history.  Continue Crestor 20mg . LDL 61 this admission, ideally <55.    History of VT S/p AICD   Patient has a long history of recurrent VT. Has had ablation and AICD in the past. Infrequent  and brief NSVT on telemetry this admission.    On Amiodarone 400mg  BID through 8/23 for NSVT with transition to 200mg  daily on 8/24.  No recent ICD shocks Maintain K>4, Mg>2   PAF    Patient remains in rate controlled afib. No symptoms.   Continue Amiodarone as above. Stopping Toprol XL given hypotension as above. Continue Eliquis 5mg  BID   COPD    Home regimen per primary team/PCCM.   For questions or updates, please contact [facility] Please consult [email or web address] for contact info under        Signed, Perlie Gold, PA-C    History and all data above reviewed.  Patient examined.  I agree with the findings as above. Breathing better this afternoon.  The patient exam reveals COR:RRR  ,  Lungs: Decreased breath sounds  ,  Abd: Positive bowel sounds, no rebound no guarding, Ext Mild ankle edema  .  All available labs, radiology testing, previous records reviewed. Agree with documented assessment and plan.    Acute systolic HF:  Good UO yesterday and renal function tolerated this.   I keep encouraging him to keep his feet up.  I will apply compression stockings.  Continue IV diuresis.    Aneil Kalid Ghan  1:47 PM  09/02/2022

## 2022-09-03 DIAGNOSIS — J9621 Acute and chronic respiratory failure with hypoxia: Secondary | ICD-10-CM | POA: Diagnosis not present

## 2022-09-03 LAB — BASIC METABOLIC PANEL
Anion gap: 9 (ref 5–15)
BUN: 21 mg/dL (ref 8–23)
CO2: 27 mmol/L (ref 22–32)
Calcium: 8.2 mg/dL — ABNORMAL LOW (ref 8.9–10.3)
Chloride: 98 mmol/L (ref 98–111)
Creatinine, Ser: 1.84 mg/dL — ABNORMAL HIGH (ref 0.61–1.24)
GFR, Estimated: 39 mL/min — ABNORMAL LOW (ref >60)
Glucose, Bld: 110 mg/dL — ABNORMAL HIGH (ref 70–99)
Potassium: 3.8 mmol/L (ref 3.5–5.1)
Sodium: 134 mmol/L — ABNORMAL LOW (ref 135–145)

## 2022-09-03 LAB — CBC
HCT: 40.6 % (ref 39.0–52.0)
Hemoglobin: 12.7 g/dL — ABNORMAL LOW (ref 13.0–17.0)
MCH: 29.7 pg (ref 26.0–34.0)
MCHC: 31.3 g/dL (ref 30.0–36.0)
MCV: 95.1 fL (ref 80.0–100.0)
Platelets: 153 10*3/uL (ref 150–400)
RBC: 4.27 MIL/uL (ref 4.22–5.81)
RDW: 15.4 % (ref 11.5–15.5)
WBC: 7.9 10*3/uL (ref 4.0–10.5)
nRBC: 0 % (ref 0.0–0.2)

## 2022-09-03 LAB — MAGNESIUM: Magnesium: 2.3 mg/dL (ref 1.7–2.4)

## 2022-09-03 NOTE — Progress Notes (Signed)
PROGRESS NOTE    Max Rivas  ZOX:[id number] DOB: [birth year] DOA: 08/26/2022 PCP: [facility]     Brief Narrative:  72 year old with history of CAD status post MI with CABG, systolic CHF EF 35%, A-fib on Eliquis, chronic hypoxia on O2, COPD presents to the ED with worsening shortness of breath for the past 2 days.  He is admitted to the hospital with concerns of CHF exacerbation.  Chest x-ray showed cardiomegaly with central venous congestion/pulmonary edema.  Started on IV diuretics.     Assessment & Plan:  Principal Problem:   Acute on chronic hypoxic respiratory failure (HCC) Active Problems:   Hypoxia     Acute hypoxic respiratory distress Acute congestive heart failure with reduced ejection fraction, 30%.  Class III -Worsening hypoxia this morning on 8 L nasal cannula.  Will repeat chest x-ray.  Echocardiogram shows EF of 30%.  Currently on home medicine of Imdur, Toprol-XL.  Currently Jardiance is on hold, diuretics managed by cardiology team. - CT scan shows severe emphysematous disease with bilateral groundglass opacity.   Hypokalemia -As needed repletion   AKI on CKD stage II -Baseline creatinine 1.3, peaked at 1.83, slowly improving, this morning 1.84   History of CAD status post CABG -Currently chest pain-free.  Continue aspirin, statin, Imdur, Toprol-XL, will adjust for soft blood pressure while he is getting diuretics   History of atrial fibrillation, paroxysmal -On amiodarone, Toprol-XL and Eliquis   COPD -Continue home bronchodilators.  Also added as needed.  I-S/flutter CT scan showed severe emphysema with bilateral groundglass opacity.  Seen by pulmonary.  Unfortunately patient is at end-stage COPD and overall will require higher oxygen.  By pulmonary, palliative care team also consulted.   GERD -Protonix   History of mood disorder - On amitriptyline   Hypothyroidism - Synthroid   Palliative care consultation is still pending   DVT  prophylaxis: SCDs Start: 08/26/22 2258 apixaban (ELIQUIS) tablet 5 mg  Code Status:  Family Communication:   Ongoing management for fluid overload and respiratory distress               Diet Orders (From admission, onward)     Start     Ordered   08/27/22 0824  Diet heart healthy/carb modified Room service appropriate? Yes; Fluid consistency: Thin; Fluid restriction: 1800 mL Fluid  Diet effective now       Question Answer Comment  Diet-HS Snack? Nothing   Room service appropriate? Yes   Fluid consistency: Thin   Fluid restriction: 1800 mL Fluid      08/27/22 0824            Subjective: Seen at bedside.  Tells me his breathing remains stable at this time.  Currently still having exertional dyspnea and remains on 7-8 L nasal cannula   Examination:  General exam: Appears calm and comfortable  Respiratory system: Bibasilar crackles Cardiovascular system: S1 & S2 heard, RRR. No JVD, murmurs, rubs, gallops or clicks. No pedal edema. Gastrointestinal system: Abdomen is nondistended, soft and nontender. No organomegaly or masses felt. Normal bowel sounds heard. Central nervous system: Alert and oriented. No focal neurological deficits. Extremities: Symmetric 5 x 5 power. Skin: No rashes, lesions or ulcers Psychiatry: Judgement and insight appear normal. Mood & affect appropriate.  Objective: Vitals:   09/02/22 2028 09/03/22 0435 09/03/22 0539 09/03/22 0747  BP: 106/84 105/80    Pulse: 93 82    Resp: 18 18    Temp: 97.6 F (36.4 C) 97.7 F (36.5  C)    TempSrc: Oral Oral    SpO2: 92% 96%  90%  Weight:   99 kg   Height:        Intake/Output Summary (Last 24 hours) at 09/03/2022 1422 Last data filed at 09/03/2022 1240 Gross per 24 hour  Intake 363 ml  Output 1500 ml  Net -1137 ml   Filed Weights   09/01/22 0600 09/02/22 0557 09/03/22 0539  Weight: 99.5 kg 99.8 kg 99 kg    Scheduled Meds:  amiodarone  400 mg Oral BID   Followed by   Melene Muller ON 09/05/2022]  amiodarone  200 mg Oral Daily   amitriptyline  25 mg Oral QHS   apixaban  5 mg Oral BID   arformoterol  15 mcg Nebulization BID   aspirin EC  81 mg Oral Daily   budesonide  0.5 mg Nebulization BID   Chlorhexidine Gluconate Cloth  6 each Topical Daily   cholecalciferol  2,000 Units Oral Daily   cyclobenzaprine  10 mg Oral QPC breakfast   famotidine  20 mg Oral QHS   gabapentin  200 mg Oral BID   levothyroxine  100 mcg Oral QAC breakfast   montelukast  10 mg Oral QHS   multivitamin  1 tablet Oral Daily   multivitamin with minerals  1 tablet Oral Q breakfast   pantoprazole  40 mg Oral BID   revefenacin  175 mcg Nebulization Daily   rosuvastatin  20 mg Oral QHS   sodium chloride flush  3 mL Intravenous Q12H   Continuous Infusions:  sodium chloride      Nutritional status     Body mass index is 30.44 kg/m.  Data Reviewed:   CBC: Recent Labs  Lab 08/30/22 0512 08/31/22 0449 09/01/22 0505 09/02/22 0506 09/03/22 0501  WBC 6.3 5.9 6.4 8.6 7.9  HGB 12.2* 13.0 13.2 12.8* 12.7*  HCT 39.0 41.4 42.5 41.1 40.6  MCV 94.7 93.5 94.9 94.1 95.1  PLT 144* 145* 143* 157 153   Basic Metabolic Panel: Recent Labs  Lab 08/28/22 0437 08/29/22 0527 08/30/22 0512 08/31/22 0449 09/01/22 0505 09/02/22 0506 09/03/22 0501  NA 133*   < > 136 135 136 137 134*  K 3.9   < > 3.8 4.7 4.9 4.6 3.8  CL 97*   < > 100 100 100 99 98  CO2 25   < > 26 25 27 27 27   GLUCOSE 97   < > 95 94 108* 90 110*  BUN 16   < > 20 18 23 20 21   CREATININE 1.71*   < > 1.58* 1.54* 1.62* 1.53* 1.84*  CALCIUM 8.7*   < > 8.4* 8.6* 8.4* 8.8* 8.2*  MG 2.6*   < > 2.4 2.4 2.5* 2.4 2.3  PHOS 4.3  --   --   --   --   --   --    < > = values in this interval not displayed.   GFR: Estimated Creatinine Clearance: 44.8 mL/min (A) (by C-G formula based on SCr of 1.84 mg/dL (H)). Liver Function Tests: No results for input(s): "AST", "ALT", "ALKPHOS", "BILITOT", "PROT", "ALBUMIN" in the last 168 hours. No results for  input(s): "LIPASE", "AMYLASE" in the last 168 hours. No results for input(s): "AMMONIA" in the last 168 hours. Coagulation Profile: No results for input(s): "INR", "PROTIME" in the last 168 hours. Cardiac Enzymes: No results for input(s): "CKTOTAL", "CKMB", "CKMBINDEX", "TROPONINI" in the last 168 hours. BNP (last 3 results) No results for input(s): "  PROBNP" in the last 8760 hours. HbA1C: No results for input(s): "HGBA1C" in the last 72 hours. CBG: No results for input(s): "GLUCAP" in the last 168 hours. Lipid Profile: No results for input(s): "CHOL", "HDL", "LDLCALC", "TRIG", "CHOLHDL", "LDLDIRECT" in the last 72 hours. Thyroid Function Tests: No results for input(s): "TSH", "T4TOTAL", "FREET4", "T3FREE", "THYROIDAB" in the last 72 hours. Anemia Panel: No results for input(s): "VITAMINB12", "FOLATE", "FERRITIN", "TIBC", "IRON", "RETICCTPCT" in the last 72 hours. Sepsis Labs: Recent Labs  Lab 08/29/22 0925  LATICACIDVEN 1.4    Recent Results (from the past 240 hour(s))  SARS Coronavirus 2 by RT PCR (hospital order, performed in [facility] hospital lab) *cepheid single result test* Anterior Nasal Swab     Status: None   Collection Time: 08/26/22  4:38 PM   Specimen: Anterior Nasal Swab  Result Value Ref Range Status   SARS Coronavirus 2 by RT PCR NEGATIVE NEGATIVE Final    Comment: (NOTE) SARS-CoV-2 target nucleic acids are NOT DETECTED.  The SARS-CoV-2 RNA is generally detectable in upper and lower respiratory specimens during the acute phase of infection. The lowest concentration of SARS-CoV-2 viral copies this assay can detect is 250 copies / mL. A negative result does not preclude SARS-CoV-2 infection and should not be used as the sole basis for treatment or other patient management decisions.  A negative result may occur with improper specimen collection / handling, submission of specimen other than nasopharyngeal swab, presence of viral mutation(s) within the areas  targeted by this assay, and inadequate number of viral copies (<250 copies / mL). A negative result must be combined with clinical observations, patient history, and epidemiological information.  Fact Sheet for Patients:   RoadLapTop.co.za  Fact Sheet for Healthcare Providers: http://kim-miller.com/  This test is not yet approved or  cleared by the [place] FDA and has been authorized for detection and/or diagnosis of SARS-CoV-2 by FDA under an Emergency Use Authorization (EUA).  This EUA will remain in effect (meaning this test can be used) for the duration of the COVID-19 declaration under Section 564(b)(1) of the Act, 21 U.S.C. section 360bbb-3(b)(1), unless the authorization is terminated or revoked sooner.  Performed at [facility], [address], [place], [place] [address]          Radiology Studies: No results found.         LOS: 7 days   Time spent= 35 mins    Miguel Rota, MD [facility]  If 7PM-7AM, please contact night-coverage  09/03/2022, 2:22 PM

## 2022-09-03 NOTE — Progress Notes (Addendum)
Patient Name: Max Rivas Date of Encounter: 09/03/2022 [facility] Cardiologist: None   Interval Summary  .    Patient reports feeling better this AM. He continues to feel short of breath whenever he tries to move around. Nebulizer treatments have been helping his breathing. Feels like his urine production is slowing down   Vital Signs .    Vitals:   09/02/22 2028 09/03/22 0435 09/03/22 0539 09/03/22 0747  BP: 106/84 105/80    Pulse: 93 82    Resp: 18 18    Temp: 97.6 F (36.4 C) 97.7 F (36.5 C)    TempSrc: Oral Oral    SpO2: 92% 96%  90%  Weight:   99 kg   Height:        Intake/Output Summary (Last 24 hours) at 09/03/2022 1011 Last data filed at 09/03/2022 0911 Gross per 24 hour  Intake 363 ml  Output 1525 ml  Net -1162 ml      09/03/2022    5:39 AM 09/02/2022    5:57 AM 09/01/2022    6:00 AM  Last 3 Weights  Weight (lbs) 218 lb 4.1 oz 220 lb 0.3 oz 219 lb 6.4 oz  Weight (kg) 99 kg 99.8 kg 99.519 kg      Telemetry/ECG    Atrial fibrillation, HR in the 80s-90s - Personally Reviewed  Physical Exam .   GEN: No acute distress.  Sitting comfortably in the armchair. Wearing [place]  Neck: No JVD Cardiac: irregular rate and rhythm, no murmurs, rubs, or gallops. Radial pulses 2+ bilaterally  Respiratory: Expiratory wheezing present throughout. Normal work of breathing on [facility] GI: Soft, nontender, non-distended  MS: Trace edema in BLE   Assessment & Plan .     Acute on Chronic HFrEF  Biventricular Heart Failure  Hypoxic Respiratory Failure  - Echocardiogram this admission showed EF 25-30%, severely reduced RV function, moderately elevated pulmonary artery systolic pressure  - BNP elevated to 864 on admission, but had risen to 829 on 8/19  - On 8/20, despite diuresis, patient had significant dyspnea and hypoxia. PCCM was asked to see patient. CT chest showed severe emphysema, bilateral diffuse ground glass opacities, calcified right pleural plaques, rounded  subpleural opacities. Overall, difficult situation and PCCM recommended palliative involvement  - Patient has been on IV lasix 80 mg BID- output 1.5 L urine yesterday and is neg -6 L since admission. Creatinine did increase from 1.53 yesterday to 1.84 this AM. Feels that his urine output has slowed down. Catheter bag is holding amber colored urine  - Hold second dose of IV lasix today- follow urine output today and reassess BMP in the AM - BP continues to be low- normal - continue to hold home imdur and toprol. Skin is warm and well perfused  - Plan for AHF follow up at discharge - Agree with palliative care consult   CAD  Elevated Troponins HLD  - Multiple prior PCIs and CABG in 05/2019  - Patient denies chest pain this admission  - hsTn 16, 20, 20, 20 - minimal elevation with flat trend. Suspect demand ischemia due to CHF and hypoxia  - Continue ASA- patient is on ASA and eliquis as an outpatient due to extensive CAD history. Hemoglobin is stable  - Continue crestor 20 mg daily- LDL 61 this admission. Goal <55    History of VT S/p AICD - Patient has a long history of recurrent VT. Has had ablation and AICD in the past  -  Now on amiodarone 400 mg BID for 7 days (until 8/24). Then, plan to reduce dose back down to 200 mg daily  - Per telemetry, he did not have any NSVT yesterday evening into this AM  - He has not been shocked by his ICD in over 1 year  - Maintain K >4, mag>2    COPD  - Normally on 5 L oxygen at home - CT chest this admission showed severe emphysema. Patient on nebulizer regimen per pulm - Reports that he does notice significant improvement in his breathing when using inhalers/nebulizer treatments    PAF  - Patient is currently in afib, but HR are well controlled  - Continue amiodarone - reloading for NSVT as above - Continue eliquis 5 mg Bid    For questions or updates, please contact [facility] Please consult [email or web address] for contact info under         Signed, Jonita Albee, PA-C   History and all data above reviewed.  Patient examined.  I agree with the findings as above.   5 liters at rest and 8 liters when he moves from chair to bed.  Breathing slightly better.  No pain The patient exam reveals COR:RRR  ,  Lungs: Decreased breath sounds  ,  Abd: Positive bowel sounds, no rebound no guarding, Ext No edema  .  All available labs, radiology testing, previous records reviewed. Agree with documented assessment and plan.  Acute systolic heart failure: I do suspect we will getting to the limits of IV diuresis benefit based on his creatinine and concentrated urine.  Agree with holding p.m. dose and reassessing.  PAF: Tolerating anticoagulation as above.  Reasonable rate control.  Nonsustained VT: No sustained runs.  Continue amiodarone.   Hematuria:  Blood in Foley today.  I suspect the Foley can be removed in the AM as I suspect we will be changing to PO.  Hematuria is like trauma.  Follow once Foley removed.  Avish Emrik Erhard  2:47 PM  09/03/2022

## 2022-09-03 NOTE — Consult Note (Signed)
Consultation Note Date: 09/03/2022   Patient Name: Max Rivas  DOB: [birth year]  MRN: [id number]  Age / Sex: 72 y.o., male  PCP: [facility] Referring Physician: Miguel Rota, MD  Reason for Consultation: Establishing goals of care  HPI/Patient Profile: 72 y.o. male   admitted on 08/26/2022   72 year old with history of CAD status post MI with CABG, systolic CHF EF 35%, also has AICD, A-fib on Eliquis, chronic hypoxia on O2, COPD presents to the ED with worsening shortness of breath for the past 2 days. He is admitted to the hospital with concerns of CHF exacerbation.  Clinical Assessment and Goals of Care: Patient remains admitted to hospital medicine service for acute hypoxic respiratory distress, class III acute congestive heart failure with reduced ejection fraction of 30% as well as end-stage COPD.  Has been seen and evaluated by pulmonary services.  He was on 5 L continuous oxygen via nasal cannula but now is on 8 L. Both cardiology and pulmonary services are following.  Palliative consult has been deemed appropriate for goals of care discussions with regards to patient's chronic irreversible illness of heart as well as lung disease. Patient is awake alert sitting up in chair.  He is in no distress.  He is on 8 L supplemental oxygen via nasal cannula.  I introduced myself and palliative care as follows: Palliative medicine is specialized medical care for people living with serious illness. It focuses on providing relief from the symptoms and stress of a serious illness. The goal is to improve quality of life for both the patient and the family. Goals of care: Broad aims of medical therapy in relation to the patient's values and preferences. Our aim is to provide medical care aimed at enabling patients to achieve the goals that matter most to them, given the circumstances of their particular  medical situation and their constraints.    NEXT OF KIN  Lives with roommate, has nephew who lives in [place].   Discussion/SUMMARY OF RECOMMENDATIONS   Goals of care discussions undertaken with the patient.  He is awake alert oriented and is able to provide history as well as participate in goals of care discussions.  Agree with CODE STATUS of DO NOT RESUSCITATE.  Discussed frankly but compassionately, to the best of my ability about the patient serious life limiting illnesses of end-stage COPD as well as stage III acute congestive heart failure with reduced ejection fraction 30%.  Patient states that he is becoming short of breath with the slightest exertion.  This places him at stage IV NYHA. We discussed about differences between hospice and palliative.  We discussed about symptom burden from ongoing decline from heart and lung conditions.  Patient states that he is spiritually at peace and that he has had a good life.  He states that he lives with a roommate who is currently undergoing health problems on their own.  The only next of kin is a nephew who lives in [place]. Plan: Home with home health care,  home-based palliative and outpatient cardiology follow-up. Discussed with patient that depending on his trajectory the home-based palliative services might switch to full hospice support in the near future.  He is aware. Thank you for the consult.  Code Status/Advance Care Planning: DNR   Symptom Management:     Palliative Prophylaxis:  Delirium Protocol    Psycho-social/Spiritual:  Desire for further Chaplaincy support:yes Additional Recommendations: Caregiving  Support/Resources  Prognosis:  Unable to determine  Discharge Planning: Home with Palliative Services      Primary Diagnoses: Present on Admission:  Acute on chronic hypoxic respiratory failure (HCC)  Hypoxia   I have reviewed the medical record, interviewed the patient and family, and examined the patient. The  following aspects are pertinent.  Past Medical History:  Diagnosis Date   Arthritis    "elbows, knees" (02/24/2016)   Bronchitis 2006   CAD (coronary artery disease)    a. s/p prior MIs - 1995 x 2, 1998; b. s/p prior LCX stenting; c. 04/2019 Cath: LM min irregs, LAD 65p/mi, D1 75, LCX 99ost/p, 22m (ISR), OM2 80, RCA/RPDA mod diff dzs; d. 05/2019 CABG x 3: LIMA->LAD, VG->Diag, VG->OM.   COPD (chronic obstructive pulmonary disease) (HCC)    a. Remote tobacco-->on home O2.   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 05/2019 Echo: EF 40-45%, gr2 DD. Nl RV size/fxn. Mildly dil LA. Mild MR.   High cholesterol    Ischemic cardiomyopathy    a. 05/2019 Echo: EF 40-45%.   Morbid obesity (HCC)    Myocardial infarction (HCC) ~ 1995 X 2;1998; 2000; 2004   On home oxygen therapy    "2L w/activity" (02/24/2016)   PAF (paroxysmal atrial fibrillation) (HCC)    a. CHA2DS2VASc = 4-->eliquis. Also on amio.   Pulmonary embolism (HCC) 02/24/2016   Sleep apnea    "have CPAP; can't tolerate it" (02/24/2016)   TIA (transient ischemic attack) 06/2021   Ventricular tachycardia (HCC)    a. 2005 s/p ICD; b. 03/2011 Device upgrade/lead exchange: MDT Protecta XT VR single lead AICD.   Social History   Socioeconomic History   Marital status: Divorced    Spouse name: Not on file   Number of children: 1   Years of education: Not on file   Highest education level: High school graduate  Occupational History   Occupation: Retired  Tobacco Use   Smoking status: Former    Current packs/day: 0.00    Average packs/day: 3.0 packs/day for 35.0 years (105.0 ttl pk-yrs)    Types: Cigarettes    Start date: 05/21/1984    Quit date: 05/22/2019    Years since quitting: 3.2   Smokeless tobacco: Former    Quit date: 05/21/2017  Vaping Use   Vaping status: Never Used  Substance and Sexual Activity   Alcohol use: Not Currently   Drug use: Not Currently     Types: Cocaine    Comment: none since 1995   Sexual activity: Not Currently  Other Topics Concern   Not on file  Social History Narrative   Lives with room mate   Social Determinants of Health   Financial Resource Strain: Low Risk  (09/01/2022)   Overall Financial Resource Strain (CARDIA)    Difficulty of Paying Living Expenses: Not very hard  Food Insecurity: No Food Insecurity (09/01/2022)   Hunger Vital Sign    Worried About Running Out of Food in the Last Year: Never true  Ran Out of Food in the Last Year: Never true  Transportation Needs: No Transportation Needs (09/01/2022)   PRAPARE - Administrator, Civil Service (Medical): No    Lack of Transportation (Non-Medical): No  Physical Activity: Not on file  Stress: Not on file  Social Connections: Unknown (05/23/2021)   Received from [facility]   Social Network    Social Network: Not on file   Family History  Problem Relation Age of Onset   Heart attack Mother    Heart attack Father    Heart attack Sister 23   Heart attack Brother 48   Scheduled Meds:  amiodarone  400 mg Oral BID   Followed by   Melene Muller ON 09/05/2022] amiodarone  200 mg Oral Daily   amitriptyline  25 mg Oral QHS   apixaban  5 mg Oral BID   arformoterol  15 mcg Nebulization BID   aspirin EC  81 mg Oral Daily   budesonide  0.5 mg Nebulization BID   Chlorhexidine Gluconate Cloth  6 each Topical Daily   cholecalciferol  2,000 Units Oral Daily   cyclobenzaprine  10 mg Oral QPC breakfast   famotidine  20 mg Oral QHS   gabapentin  200 mg Oral BID   levothyroxine  100 mcg Oral QAC breakfast   montelukast  10 mg Oral QHS   multivitamin  1 tablet Oral Daily   multivitamin with minerals  1 tablet Oral Q breakfast   pantoprazole  40 mg Oral BID   revefenacin  175 mcg Nebulization Daily   rosuvastatin  20 mg Oral QHS   sodium chloride flush  3 mL Intravenous Q12H   Continuous Infusions:  sodium chloride     PRN Meds:.sodium chloride,  acetaminophen, hydrALAZINE, levalbuterol, metoprolol tartrate, nitroGLYCERIN, ondansetron (ZOFRAN) IV, mouth rinse, senna-docusate, sodium chloride flush, traZODone Medications Prior to Admission:  Prior to Admission medications   Medication Sig Start Date End Date Taking? Authorizing Provider  acetaminophen (TYLENOL) 500 MG tablet Take 1,000 mg by mouth every 8 (eight) hours as needed for moderate pain, headache or mild pain.   Yes [provider]  amiodarone (PACERONE) 200 MG tablet Take 1 tablet (200 mg total) by mouth 2 (two) times daily for 7 days, THEN 1 tablet (200 mg total) daily. Patient taking differently: 200 mg once daily. 06/25/21 08/26/22 Yes Elgergawy, Leana Roe, MD  amitriptyline (ELAVIL) 25 MG tablet Take 25 mg by mouth at bedtime.   Yes [provider]  apixaban (ELIQUIS) 5 MG TABS tablet Take 5 mg by mouth 2 (two) times daily.   Yes [provider]  aspirin EC 81 MG tablet Take 81 mg by mouth daily.   Yes [provider]  carboxymethylcellulose 1 % ophthalmic solution Place 1 drop into both eyes 4 (four) times daily.   Yes [provider]  Cholecalciferol (VITAMIN D3) 50 MCG (2000 UT) TABS Take 2,000 Units by mouth daily.   Yes [provider]  cyclobenzaprine (FLEXERIL) 10 MG tablet Take 10 mg by mouth daily after breakfast.   Yes [provider]  empagliflozin (JARDIANCE) 10 MG TABS tablet Take 1 tablet (10 mg total) by mouth daily. 07/12/22  Yes Kathlen Mody, MD  furosemide (LASIX) 20 MG tablet Take 3 tablets (60 mg total) by mouth 2 (two) times daily. 07/11/22  Yes Kathlen Mody, MD  gabapentin (NEURONTIN) 100 MG capsule Take 2 capsules (200 mg total) by mouth 2 (two) times daily. 06/01/19  Yes Barrett, Denny Peon  R, PA-C  isosorbide mononitrate (IMDUR) 30 MG 24 hr tablet Take 1 tablet (30 mg total) by mouth daily. 03/13/21  Yes Harris, Abigail, PA-C  levalbuterol [facility] HFA) 45 MCG/ACT inhaler Inhale 2 puffs into the lungs  every 6 (six) hours as needed for wheezing or shortness of breath.   Yes [provider]  levothyroxine (SYNTHROID) 100 MCG tablet Take 100 mcg by mouth daily before breakfast.   Yes [provider]  lidocaine (LIDODERM) 5 % Place 2 patches onto the skin daily.   Yes [provider]  Magnesium 200 MG CHEW Chew 400 mg by mouth daily.   Yes [provider]  metoprolol succinate (TOPROL-XL) 25 MG 24 hr tablet Take 12.5 mg by mouth daily.   Yes [provider]  mometasone (ASMANEX) 220 MCG/INH inhaler Inhale 2 puffs into the lungs 2 (two) times daily.   Yes [provider]  montelukast (SINGULAIR) 10 MG tablet Take 10 mg by mouth at bedtime.   Yes [provider]  Multiple Vitamin (MULTIVITAMIN WITH MINERALS) TABS tablet Take 1 tablet by mouth daily with breakfast.   Yes [provider]  Multiple Vitamins-Minerals (PRESERVISION AREDS 2) CAPS Take 1 capsule by mouth 2 (two) times daily.   Yes [provider]  nitroGLYCERIN (NITROSTAT) 0.4 MG SL tablet Place 0.4 mg under the tongue every 5 (five) minutes as needed for chest pain.    Yes [provider]  Omega-3 Fatty Acids (FISH OIL) 1000 MG CAPS Take 1,000 mg by mouth in the morning and at bedtime.   Yes [provider]  omeprazole (PRILOSEC) 20 MG capsule Take 40 mg by mouth daily.   Yes [provider]  OXYGEN Inhale 4 L into the lungs continuous.   Yes [provider]  potassium chloride SA (KLOR-CON) 20 MEQ tablet Take 1 tablet (20 mEq total) by mouth 2 (two) times daily. 03/19/20  Yes Graciella Freer, PA-C  rosuvastatin (CRESTOR) 40 MG tablet Take 20 mg by mouth at bedtime.   Yes [provider]  Tiotropium Bromide-Olodaterol 2.5-2.5 MCG/ACT AERS Inhale 2 puffs into the lungs daily.   Yes [provider]   Allergies  Allergen Reactions   Crestor [Rosuvastatin Calcium] Other (See Comments)    Back spasms,  initially, but can tolerate it at a low dose now, in 2022 Tolerating 20mg  QD   Lipitor [Atorvastatin] Other (See Comments)    Spasticity   Ventolin [Albuterol] Palpitations and Other (See Comments)    Irregular heartbeat   Review of Systems Dyspnea with the slightest exertion. Physical Exam Awake alert Bibasilar crackles S1-S2 Trace pedal edema No focal deficits mood and affect within normal limits  Vital Signs: BP 105/80 (BP Location: Right Arm)   Pulse 82   Temp 97.7 F (36.5 C) (Oral)   Resp 18   Ht 5\' 11"  (1.803 m)   Wt 99 kg   SpO2 90%   BMI 30.44 kg/m  Pain Scale: 0-10   Pain Score: 0-No pain   SpO2: SpO2: 90 % O2 Device:SpO2: 90 % O2 Flow Rate: .O2 Flow Rate (L/min): 8 L/min  IO: Intake/output summary:  Intake/Output Summary (Last 24 hours) at 09/03/2022 1407 Last data filed at 09/03/2022 1240 Gross per 24 hour  Intake 363 ml  Output 1500 ml  Net -1137 ml    LBM: Last BM Date : 09/02/22 Baseline Weight: Weight: 106.6 kg Most recent weight: Weight: 99 kg     Palliative Assessment/Data:   PPS  50%  Time In:  1300 Time Out:  1400 Time Total:  60  Greater than 50%  of this time was spent counseling and coordinating care related to the above assessment and plan.  Signed by: Rosalin Hawking, MD   Please contact Palliative Medicine Team phone at [phone] for questions and concerns.  For individual provider: See Loretha Stapler

## 2022-09-03 NOTE — Plan of Care (Signed)
Monitor labs and vital signs. Continuous pulse oximetry. Monitor dyspnea with exertion and oxygen requirements. Continue current plan of care.

## 2022-09-04 DIAGNOSIS — J9621 Acute and chronic respiratory failure with hypoxia: Secondary | ICD-10-CM | POA: Diagnosis not present

## 2022-09-04 LAB — CBC
HCT: 39.8 % (ref 39.0–52.0)
Hemoglobin: 12.5 g/dL — ABNORMAL LOW (ref 13.0–17.0)
MCH: 29.2 pg (ref 26.0–34.0)
MCHC: 31.4 g/dL (ref 30.0–36.0)
MCV: 93 fL (ref 80.0–100.0)
Platelets: 149 10*3/uL — ABNORMAL LOW (ref 150–400)
RBC: 4.28 MIL/uL (ref 4.22–5.81)
RDW: 15.4 % (ref 11.5–15.5)
WBC: 7.5 10*3/uL (ref 4.0–10.5)
nRBC: 0 % (ref 0.0–0.2)

## 2022-09-04 LAB — BASIC METABOLIC PANEL
Anion gap: 10 (ref 5–15)
BUN: 19 mg/dL (ref 8–23)
CO2: 27 mmol/L (ref 22–32)
Calcium: 8.6 mg/dL — ABNORMAL LOW (ref 8.9–10.3)
Chloride: 100 mmol/L (ref 98–111)
Creatinine, Ser: 1.55 mg/dL — ABNORMAL HIGH (ref 0.61–1.24)
GFR, Estimated: 48 mL/min — ABNORMAL LOW (ref >60)
Glucose, Bld: 94 mg/dL (ref 70–99)
Potassium: 4.2 mmol/L (ref 3.5–5.1)
Sodium: 137 mmol/L (ref 135–145)

## 2022-09-04 LAB — MAGNESIUM: Magnesium: 2.4 mg/dL (ref 1.7–2.4)

## 2022-09-04 MED ORDER — FUROSEMIDE 40 MG PO TABS
40.0000 mg | ORAL_TABLET | Freq: Two times a day (BID) | ORAL | Status: DC
  Administered 2022-09-04 – 2022-09-07 (×7): 40 mg via ORAL
  Filled 2022-09-04 (×7): qty 1

## 2022-09-04 NOTE — Plan of Care (Signed)
  Problem: Activity: Goal: Risk for activity intolerance will decrease Outcome: Progressing   Problem: Coping: Goal: Level of anxiety will decrease Outcome: Progressing   Problem: Elimination: Goal: Will not experience complications related to bowel motility Outcome: Progressing Goal: Will not experience complications related to urinary retention Outcome: Progressing   Problem: Pain Managment: Goal: General experience of comfort will improve Outcome: Progressing   

## 2022-09-04 NOTE — Progress Notes (Addendum)
Patient Name: Max Rivas Date of Encounter: 09/04/2022 [facility] Cardiologist: None   Interval Summary  .    Patient continues to have significant shortness of breath on exertion. Was walking in the halls this AM and noted that his oxygen dropped to the 70s. When at rest, breathing feels OK. Remains on HFNC. No chest pain, palpitations, ankle edema, abdominal distention   Vital Signs .    Vitals:   09/03/22 2042 09/04/22 0437 09/04/22 0457 09/04/22 0818  BP: 110/68 105/68    Pulse: 84 69    Resp:  20    Temp: 98 F (36.7 C) (!) 97.5 F (36.4 C)    TempSrc: Axillary Oral    SpO2: 96% 98%  90%  Weight:   98.5 kg   Height:        Intake/Output Summary (Last 24 hours) at 09/04/2022 0944 Last data filed at 09/04/2022 0456 Gross per 24 hour  Intake 240 ml  Output 1350 ml  Net -1110 ml      09/04/2022    4:57 AM 09/03/2022    5:39 AM 09/02/2022    5:57 AM  Last 3 Weights  Weight (lbs) 217 lb 2.5 oz 218 lb 4.1 oz 220 lb 0.3 oz  Weight (kg) 98.5 kg 99 kg 99.8 kg      Telemetry/ECG    Atrial fibrillation, HR in the 70s-100s  - Personally Reviewed  Physical Exam .   GEN: No acute distress.  Sitting comfortably in the armchair Neck: No JVD Cardiac: Irregular rate and rhythm. no murmurs, rubs, or gallops. Radial pulses 2+ bilaterally  Respiratory: Inspiratory and expiratory wheezing present throughout. Normal work of breathing on HFNC  GI: Soft, nontender MS: No edema in BLE. Wearing compression stockings   Assessment & Plan .     Acute on Chronic HFrEF  Biventricular Heart Failure  Hypoxic Respiratory Failure  - Echocardiogram this admission showed EF 25-30%, severely reduced RV function, moderately elevated pulmonary artery systolic pressure  - BNP elevated to 684 on admission, but had risen to 829 on 8/19  - On 8/20, despite diuresis, patient had significant dyspnea and hypoxia. PCCM was asked to see patient. CT chest showed severe emphysema, bilateral  diffuse ground glass opacities, calcified right pleural plaques, rounded subpleural opacities. Overall, difficult situation and PCCM recommended palliative involvement  - Palliative care saw yesterday- Patient now DNR, plan to go home with home health and home-based palliative care  - Patient has been diuresing on IV lasix - output 1.35 L urine yesterday and is currently net -6.8 L since admission. IV lasix was held yesterday afternoon following bump in creatinine - Today, creatinine returned to baseline- start PO lasix 40 mg BID today  - Continues to have shortness of breath on exertion, but he is fairly euvolemic on exam. Suspect that his shortness of breath is more so due to COPD  - BP continues to be low- normal - continue to hold home imdur and toprol. Skin is warm and well perfused  - Plan for AHF follow up at discharge    CAD  Elevated Troponins HLD  - Multiple prior PCIs and CABG in 05/2019  - Patient denies chest pain this admission  - hsTn 16, 20, 20, 20 - minimal elevation with flat trend. Suspect demand ischemia due to CHF and hypoxia  - Continue ASA- patient is on ASA and eliquis as an outpatient due to extensive CAD history. Hemoglobin is stable  - Continue crestor  20 mg daily- LDL 61 this admission. Goal <55    History of VT S/p AICD - Patient has a long history of recurrent VT. Has had ablation and AICD in the past  - Now on amiodarone 400 mg BID for 7 days (today is last day) Tomorrow we will reduce dose back down to 200 mg daily  - Per telemetry, he has not had NSVT for the past 2 days  - He has not been shocked by his ICD in over 1 year  - Maintain K >4, mag>2    COPD  - Normally on 5 L oxygen at home - CT chest this admission showed severe emphysema. Patient on nebulizer regimen per pulm - Reports that he does notice significant improvement in his breathing when using inhalers/nebulizer treatments    PAF  - Patient is currently in afib, but HR are well controlled  -  Continue amiodarone - reloading for NSVT as above - Continue eliquis 5 mg Bid   For questions or updates, please contact [facility] Please consult [email or web address] for contact info under        Signed, Jonita Albee, PA-C   History and all data above reviewed.  Patient examined.  I agree with the findings as above.   Abmulated and sats dropped to 50.    Now on oral diuresis.  The patient exam reveals COR:RRR  ,  Lungs: Clear  ,  Abd: Positive bowel sounds, no rebound no guarding, Ext No edema  .  All available labs, radiology testing, previous records reviewed. Agree with documented assessment and plan.   Elevated trop:  Medical management.  VT:  Continue amiodarone as ordered.  Respiratory failure:  Unfortunately this appears to be primarily pulmonary and requires high O2.  Agree with palliative care.  He does have follow in [facility] but there is likely little to offer from and Advanced HF standpoint.     Hoai Oakley Kossman  12:03 PM  09/04/2022

## 2022-09-04 NOTE — Progress Notes (Signed)
Patient walked with NT in the hallway with 10L oxygen.During the walk,oxygen saturation dropped to 72%Called patient.Patient complained of shortness of breath.Helped pt to get back in his room. MD notified.Will continue to monitor oxygen saturation.

## 2022-09-04 NOTE — Progress Notes (Signed)
PROGRESS NOTE    Max Rivas  WUJ:[id number] DOB: [birth year] DOA: 08/26/2022 PCP: [facility]     Brief Narrative:  72 year old with history of CAD status post MI with CABG, systolic CHF EF 35%, A-fib on Eliquis, chronic hypoxia on O2, COPD presents to the ED with worsening shortness of breath for the past 2 days.  He is admitted to the hospital with concerns of CHF exacerbation.  Chest x-ray showed cardiomegaly with central venous congestion/pulmonary edema.  Started on IV diuretics now changing to PO. 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 Hopefully home in 1-2 days     Assessment & Plan:  Principal Problem:   Acute on chronic hypoxic respiratory failure (HCC) Active Problems:   Hypoxia     Acute hypoxic respiratory distress Acute congestive heart failure with reduced ejection fraction, 30%.  Class III -Worsening hypoxia this morning on 8 L nasal cannula.  Will repeat chest x-ray.  Echocardiogram shows EF of 30%.  Cardiology team following, diuretics and GDMT per their service. - CT scan shows severe emphysematous disease with bilateral groundglass opacity.   Hypokalemia -As needed repletion   AKI on CKD stage II -Baseline creatinine 1.3, peaked at 1.83, slowly improving, this morning 1.55  Acute urinary retention secondary to BPH - Coud catheter had to be placed due to retention.  Will attempt to remove this.  He will need outpatient urology follow-up.   History of CAD status post CABG -Currently chest pain-free.  Currently on aspirin, Eliquis, statin.  Rest of the medications per cardiology team.   History of atrial fibrillation, paroxysmal -On amiodarone, Toprol-XL and Eliquis   COPD with chronic hypoxia - Bronchodilators as needed.  I-S/flutter CT scan showed severe emphysema with bilateral groundglass opacity.  Seen by pulmonary.  Unfortunately patient is at end-stage COPD and overall will require higher oxygen.  By pulmonary, palliative care team also  consulted.  Patient is now DNR.  Arranging extra home oxygen at home.  Patient used to be on 5 L nasal cannula at home, I suspect he will require higher amount of oxygen when he goes home.   GERD -Protonix   History of mood disorder - On amitriptyline   Hypothyroidism - Synthroid   Palliative care consultation is still pending   DVT prophylaxis: SCDs Start: 08/26/22 2258 apixaban (ELIQUIS) tablet 5 mg  Code Status: DNR Family Communication:   Ongoing management for fluid overload and respiratory distress       Diet Orders (From admission, onward)     Start     Ordered   08/27/22 0824  Diet heart healthy/carb modified Room service appropriate? Yes; Fluid consistency: Thin; Fluid restriction: 1800 mL Fluid  Diet effective now       Question Answer Comment  Diet-HS Snack? Nothing   Room service appropriate? Yes   Fluid consistency: Thin   Fluid restriction: 1800 mL Fluid      08/27/22 0824            Subjective: Still has exertional dyspnea, but improved quite a bit.    Examination:  General exam: Appears calm and comfortable  Respiratory system: some b/l crackles.  Cardiovascular system: S1 & S2 heard, RRR. No JVD, murmurs, rubs, gallops or clicks. No pedal edema. Gastrointestinal system: Abdomen is nondistended, soft and nontender. No organomegaly or masses felt. Normal bowel sounds heard. Central nervous system: Alert and oriented. No focal neurological deficits. Extremities: Symmetric 5 x 5 power. Skin: No rashes, lesions or ulcers Psychiatry: Judgement and  insight appear normal. Mood & affect appropriate.  Objective: Vitals:   09/04/22 0437 09/04/22 0457 09/04/22 0818 09/04/22 1013  BP: 105/68     Pulse: 69     Resp: 20     Temp: (!) 97.5 F (36.4 C)     TempSrc: Oral     SpO2: 98%  90% 93%  Weight:  98.5 kg    Height:        Intake/Output Summary (Last 24 hours) at 09/04/2022 1215 Last data filed at 09/04/2022 0456 Gross per 24 hour  Intake 240  ml  Output 1350 ml  Net -1110 ml   Filed Weights   09/02/22 0557 09/03/22 0539 09/04/22 0457  Weight: 99.8 kg 99 kg 98.5 kg    Scheduled Meds:  amiodarone  400 mg Oral BID   Followed by   Melene Muller ON 09/05/2022] amiodarone  200 mg Oral Daily   amitriptyline  25 mg Oral QHS   apixaban  5 mg Oral BID   arformoterol  15 mcg Nebulization BID   aspirin EC  81 mg Oral Daily   budesonide  0.5 mg Nebulization BID   Chlorhexidine Gluconate Cloth  6 each Topical Daily   cholecalciferol  2,000 Units Oral Daily   cyclobenzaprine  10 mg Oral QPC breakfast   famotidine  20 mg Oral QHS   furosemide  40 mg Oral BID   gabapentin  200 mg Oral BID   levothyroxine  100 mcg Oral QAC breakfast   montelukast  10 mg Oral QHS   multivitamin  1 tablet Oral Daily   multivitamin with minerals  1 tablet Oral Q breakfast   pantoprazole  40 mg Oral BID   revefenacin  175 mcg Nebulization Daily   rosuvastatin  20 mg Oral QHS   sodium chloride flush  3 mL Intravenous Q12H   Continuous Infusions:  sodium chloride      Nutritional status     Body mass index is 30.29 kg/m.  Data Reviewed:   CBC: Recent Labs  Lab 08/31/22 0449 09/01/22 0505 09/02/22 0506 09/03/22 0501 09/04/22 0441  WBC 5.9 6.4 8.6 7.9 7.5  HGB 13.0 13.2 12.8* 12.7* 12.5*  HCT 41.4 42.5 41.1 40.6 39.8  MCV 93.5 94.9 94.1 95.1 93.0  PLT 145* 143* 157 153 149*   Basic Metabolic Panel: Recent Labs  Lab 08/31/22 0449 09/01/22 0505 09/02/22 0506 09/03/22 0501 09/04/22 0441  NA 135 136 137 134* 137  K 4.7 4.9 4.6 3.8 4.2  CL 100 100 99 98 100  CO2 25 27 27 27 27   GLUCOSE 94 108* 90 110* 94  BUN 18 23 20 21 19   CREATININE 1.54* 1.62* 1.53* 1.84* 1.55*  CALCIUM 8.6* 8.4* 8.8* 8.2* 8.6*  MG 2.4 2.5* 2.4 2.3 2.4   GFR: Estimated Creatinine Clearance: 53.1 mL/min (A) (by C-G formula based on SCr of 1.55 mg/dL (H)). Liver Function Tests: No results for input(s): "AST", "ALT", "ALKPHOS", "BILITOT", "PROT", "ALBUMIN" in the  last 168 hours. No results for input(s): "LIPASE", "AMYLASE" in the last 168 hours. No results for input(s): "AMMONIA" in the last 168 hours. Coagulation Profile: No results for input(s): "INR", "PROTIME" in the last 168 hours. Cardiac Enzymes: No results for input(s): "CKTOTAL", "CKMB", "CKMBINDEX", "TROPONINI" in the last 168 hours. BNP (last 3 results) No results for input(s): "PROBNP" in the last 8760 hours. HbA1C: No results for input(s): "HGBA1C" in the last 72 hours. CBG: No results for input(s): "GLUCAP" in the last 168 hours.  Lipid Profile: No results for input(s): "CHOL", "HDL", "LDLCALC", "TRIG", "CHOLHDL", "LDLDIRECT" in the last 72 hours. Thyroid Function Tests: No results for input(s): "TSH", "T4TOTAL", "FREET4", "T3FREE", "THYROIDAB" in the last 72 hours. Anemia Panel: No results for input(s): "VITAMINB12", "FOLATE", "FERRITIN", "TIBC", "IRON", "RETICCTPCT" in the last 72 hours. Sepsis Labs: Recent Labs  Lab 08/29/22 0925  LATICACIDVEN 1.4    Recent Results (from the past 240 hour(s))  SARS Coronavirus 2 by RT PCR (hospital order, performed in [facility] hospital lab) *cepheid single result test* Anterior Nasal Swab     Status: None   Collection Time: 08/26/22  4:38 PM   Specimen: Anterior Nasal Swab  Result Value Ref Range Status   SARS Coronavirus 2 by RT PCR NEGATIVE NEGATIVE Final    Comment: (NOTE) SARS-CoV-2 target nucleic acids are NOT DETECTED.  The SARS-CoV-2 RNA is generally detectable in upper and lower respiratory specimens during the acute phase of infection. The lowest concentration of SARS-CoV-2 viral copies this assay can detect is 250 copies / mL. A negative result does not preclude SARS-CoV-2 infection and should not be used as the sole basis for treatment or other patient management decisions.  A negative result may occur with improper specimen collection / handling, submission of specimen other than nasopharyngeal swab, presence of viral  mutation(s) within the areas targeted by this assay, and inadequate number of viral copies (<250 copies / mL). A negative result must be combined with clinical observations, patient history, and epidemiological information.  Fact Sheet for Patients:   RoadLapTop.co.za  Fact Sheet for Healthcare Providers: http://kim-miller.com/  This test is not yet approved or  cleared by the [place] FDA and has been authorized for detection and/or diagnosis of SARS-CoV-2 by FDA under an Emergency Use Authorization (EUA).  This EUA will remain in effect (meaning this test can be used) for the duration of the COVID-19 declaration under Section 564(b)(1) of the Act, 21 U.S.C. section 360bbb-3(b)(1), unless the authorization is terminated or revoked sooner.  Performed at [facility], [address], [place], [place] [address]          Radiology Studies: No results found.         LOS: 8 days   Time spent= 35 mins    Miguel Rota, MD [facility]  If 7PM-7AM, please contact night-coverage  09/04/2022, 12:15 PM

## 2022-09-04 NOTE — Care Management Important Message (Signed)
Important Message  Patient Details IM Letter given. Name: Max Rivas MRN: [id number] Date of Birth: 1950/07/20   Medicare Important Message Given:  Yes     Caren Macadam 09/04/2022, 10:21 AM

## 2022-09-04 NOTE — Plan of Care (Signed)
  Problem: Clinical Measurements: Goal: Will remain free from infection Outcome: Progressing   Problem: Activity: Goal: Risk for activity intolerance will decrease Outcome: Progressing   Problem: Nutrition: Goal: Adequate nutrition will be maintained Outcome: Progressing   Problem: Safety: Goal: Ability to remain free from injury will improve Outcome: Progressing   

## 2022-09-04 NOTE — Progress Notes (Signed)
Daily Progress Note   Patient Name: Max Rivas       Date: 09/04/2022 DOB: 01/10/1951  Age: 72 y.o. MRN#: [id number] Attending Physician: Miguel Rota, MD Primary Care Physician: [facility] Admit Date: 08/26/2022  Reason for Consultation/Follow-up: Establishing goals of care  Subjective:  Awake alert Exertional dyspnea after attempting to walk in hallway Now on 10 L [place]  Length of Stay: 8  Current Medications: Scheduled Meds:   amiodarone  400 mg Oral BID   Followed by   Melene Muller ON 09/05/2022] amiodarone  200 mg Oral Daily   amitriptyline  25 mg Oral QHS   apixaban  5 mg Oral BID   arformoterol  15 mcg Nebulization BID   aspirin EC  81 mg Oral Daily   budesonide  0.5 mg Nebulization BID   Chlorhexidine Gluconate Cloth  6 each Topical Daily   cholecalciferol  2,000 Units Oral Daily   cyclobenzaprine  10 mg Oral QPC breakfast   famotidine  20 mg Oral QHS   furosemide  40 mg Oral BID   gabapentin  200 mg Oral BID   levothyroxine  100 mcg Oral QAC breakfast   montelukast  10 mg Oral QHS   multivitamin  1 tablet Oral Daily   multivitamin with minerals  1 tablet Oral Q breakfast   pantoprazole  40 mg Oral BID   revefenacin  175 mcg Nebulization Daily   rosuvastatin  20 mg Oral QHS   sodium chloride flush  3 mL Intravenous Q12H    Continuous Infusions:  sodium chloride      PRN Meds: sodium chloride, acetaminophen, hydrALAZINE, levalbuterol, metoprolol tartrate, nitroGLYCERIN, ondansetron (ZOFRAN) IV, mouth rinse, senna-docusate, sodium chloride flush, traZODone  Physical Exam         Awake alert No edema No distress currently Regular work of breathing, able to speak in full sentences Regular  Vital Signs: BP 120/81 (BP Location: Left Arm)   Pulse 91    Temp 97.6 F (36.4 C) (Oral)   Resp (!) 22   Ht 5\' 11"  (1.803 m)   Wt 98.5 kg   SpO2 94%   BMI 30.29 kg/m  SpO2: SpO2: 94 % O2 Device: O2 Device: Nasal Cannula O2 Flow Rate: O2 Flow Rate (L/min): 10 L/min  Intake/output summary:  Intake/Output Summary (Last  24 hours) at 09/04/2022 1305 Last data filed at 09/04/2022 0456 Gross per 24 hour  Intake 240 ml  Output 600 ml  Net -360 ml   LBM: Last BM Date : 09/04/22 Baseline Weight: Weight: 106.6 kg Most recent weight: Weight: 98.5 kg       Palliative Assessment/Data:      Patient Active Problem List   Diagnosis Date Noted   CHF (congestive heart failure) (HCC) 08/27/2022   Acute on chronic hypoxic respiratory failure (HCC) 08/26/2022   Acute on chronic respiratory failure with hypoxia (HCC) 07/07/2022   Essential hypertension 07/07/2022   DNR (do not resuscitate) 07/07/2022   PAF (paroxysmal atrial fibrillation) (HCC) 07/07/2022   TIA (transient ischemic attack) 06/24/2021   Hypokalemia 06/24/2021   Sepsis (HCC) 04/25/2021   Ventricular tachyarrhythmia (HCC) 12/07/2019   Cardiomyopathy, ischemic    Vomiting    Acute respiratory failure with hypoxia (HCC) 09/18/2019   Acute on chronic heart failure (HCC) 09/08/2019   Unstable angina (HCC) 05/21/2019   Ventricular tachycardia (HCC) 05/12/2019   Chest pain    VT (ventricular tachycardia) (HCC) 05/11/2019   Arrhythmia 03/12/2019   On home O2    Chronic HFrEF (heart failure with reduced ejection fraction) (HCC) 06/22/2018   Chronic respiratory failure with hypoxia (HCC) 06/22/2018   Congestive heart failure (CHF) (HCC) 06/22/2018   DCM (dilated cardiomyopathy) (HCC) 05/25/2017   Coronary artery disease 05/25/2017   Chronic atrial fibrillation 05/25/2017   COPD (chronic obstructive pulmonary disease) (HCC) 05/25/2017   CKD (chronic kidney disease) 05/25/2017   Hypothyroidism 05/25/2017   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/2017   OSA  (obstructive sleep apnea) 05/25/2017   Pulmonary HTN (HCC) 05/25/2017   Mediastinal adenopathy    Cervical adenopathy    Acute respiratory failure with hypoxia and hypercarbia (HCC) 02/24/2016   Pulmonary embolus (HCC) 02/24/2016   Pulmonary artery thrombosis (HCC)    Pleural effusion    Hypoxia    Lung mass    Pleural plaque    Flutter-fibrillation (HCC) 01/15/2016   Acute systolic congestive heart failure (HCC)    Atrial fibrillation with RVR (HCC)    Hypercholesterolemia 03/11/2006   Tobacco abuse 03/11/2006   Acute on chronic systolic congestive heart failure (HCC) 03/11/2006   GASTROESOPHAGEAL REFLUX, NO ESOPHAGITIS 03/11/2006   OSTEOARTHRITIS, MULTI SITES 03/11/2006   HIGH RISK PATIENT 03/11/2006   Tobacco dependence 03/11/2006    Palliative Care Assessment & Plan   Patient Profile:    Assessment: 72 year old gentleman with 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 Patient admitted to hospital medicine service with acute hypoxic respiratory distress, acute congestive heart failure with reduced ejection fraction 30% class III, also with COPD with chronic hypoxia.  Palliative consulted for goals of care discussions.  Recommendations/Plan:  DNR Home with palliative once medically stable and O 2 requirements known.  States he was using 5 L oxygen nasal cannula on a continuous basis at home prior to this hospitalization, he was using a walker as well.   Code Status:    Code Status Orders  (From admission, onward)           Start     Ordered   08/27/22 2117  Do not attempt resuscitation (DNR)  Continuous       Question Answer Comment  If patient has no pulse and is not breathing Do Not Attempt Resuscitation  If patient has a pulse and/or is breathing: Medical Treatment Goals LIMITED ADDITIONAL INTERVENTIONS: Use medication/IV fluids and cardiac  monitoring as indicated; Do not use intubation or mechanical ventilation (DNI), also provide comfort medications.  Transfer to Progressive/Stepdown as indicated, avoid Intensive Care.   Consent: Discussion documented in EHR or advanced directives reviewed      08/27/22 2117           Code Status History     Date Active Date Inactive Code Status Order ID Comments User Context   08/26/2022 2041 08/27/2022 2117 Full Code [id number]  Tonye Royalty, DO ED   07/07/2022 1658 07/11/2022 1642 DNR [id number]  Jonah Blue, MD ED   06/24/2021 0636 06/25/2021 2353 Partial Code [id number]  John Giovanni, MD ED   04/25/2021 0437 04/28/2021 1915 Full Code [id number]  Eduard Clos, MD ED   03/18/2020 1822 03/19/2020 1955 Full Code [id number]  Graciella Freer, PA-C ED   01/13/2020 0543 01/14/2020 2001 Full Code [id number]  Chevis Pretty, MD ED   12/31/2019 1835 01/01/2020 1828 Full Code [id number]  Dellia Cloud, MD ED   12/07/2019 0336 12/07/2019 1601 Full Code [id number]  Elmon Kirschner, MD ED   09/18/2019 1522 09/23/2019 2018 Full Code [id number]  Lanier Clam, NP ED   09/08/2019 2000 09/11/2019 2221 DNR [id number]  Elige Radon, MD ED   05/21/2019 1024 06/01/2019 1635 Full Code [id number]  Barrett, Joline Salt, PA-C ED   05/11/2019 1605 05/16/2019 0008 Full Code [id number]  Sheilah Pigeon, PA-C ED   03/12/2019 2157 03/13/2019 1847 DNR [id number]  Rosario Jacks, MD Inpatient   03/12/2019 1854 03/12/2019 2157 Full Code [id number]  Rosario Jacks, MD ED   06/22/2018 0410 06/23/2018 1651 DNR [id number]  Hillary Bow, DO ED   02/24/2016 0614 03/05/2016 1645 DNR [id number]  Eduard Clos, MD ED   01/16/2016 0813 01/19/2016 1454 DNR [id number]  Rhetta Mura, MD Inpatient   01/15/2016 2031 01/16/2016 0813 Full Code [id number]  Rhetta Mura, MD Inpatient       Prognosis:  Unable to determine  Discharge Planning: Home with Palliative Services  Care plan was  discussed with patient  Thank you for allowing the Palliative Medicine Team to assist in the care of this patient.  MOD MDM     Greater than 50%  of this time was spent counseling and coordinating care related to the above assessment and plan.  Rosalin Hawking, MD  Please contact Palliative Medicine Team phone at [phone] for questions and concerns.

## 2022-09-05 ENCOUNTER — Inpatient Hospital Stay (HOSPITAL_COMMUNITY): Payer: Medicare Other

## 2022-09-05 DIAGNOSIS — J9621 Acute and chronic respiratory failure with hypoxia: Secondary | ICD-10-CM | POA: Diagnosis not present

## 2022-09-05 DIAGNOSIS — I509 Heart failure, unspecified: Secondary | ICD-10-CM | POA: Diagnosis not present

## 2022-09-05 LAB — CBC
HCT: 41.1 % (ref 39.0–52.0)
Hemoglobin: 12.8 g/dL — ABNORMAL LOW (ref 13.0–17.0)
MCH: 29.4 pg (ref 26.0–34.0)
MCHC: 31.1 g/dL (ref 30.0–36.0)
MCV: 94.3 fL (ref 80.0–100.0)
Platelets: 150 10*3/uL (ref 150–400)
RBC: 4.36 MIL/uL (ref 4.22–5.81)
RDW: 15.4 % (ref 11.5–15.5)
WBC: 6.2 10*3/uL (ref 4.0–10.5)
nRBC: 0 % (ref 0.0–0.2)

## 2022-09-05 LAB — BASIC METABOLIC PANEL
Anion gap: 10 (ref 5–15)
BUN: 21 mg/dL (ref 8–23)
CO2: 27 mmol/L (ref 22–32)
Calcium: 8.6 mg/dL — ABNORMAL LOW (ref 8.9–10.3)
Chloride: 99 mmol/L (ref 98–111)
Creatinine, Ser: 1.48 mg/dL — ABNORMAL HIGH (ref 0.61–1.24)
GFR, Estimated: 51 mL/min — ABNORMAL LOW (ref >60)
Glucose, Bld: 90 mg/dL (ref 70–99)
Potassium: 4.2 mmol/L (ref 3.5–5.1)
Sodium: 136 mmol/L (ref 135–145)

## 2022-09-05 LAB — MAGNESIUM: Magnesium: 2.6 mg/dL — ABNORMAL HIGH (ref 1.7–2.4)

## 2022-09-05 NOTE — Consult Note (Signed)
NAME:  KYLIL CHIARI, MRN:  [id number], DOB:  [birth year], LOS: 9 ADMISSION DATE:  08/26/2022, CONSULTATION DATE:  8/20 REFERRING MD:  Nelson Chimes, CHIEF COMPLAINT:  hypoxia    History of Present Illness:  This is a 72 year old male w/ extensive cardiopulmonary hx as listed below. Last seem in our office by Dr Delton Coombes back in 2020 at that time was still active smoker and was being maintained on Stiolto, Asmanex and PRN xopenex. He has been O2 dependent since 2021 (initial 4 liters pulse to cont 5 liters since June 2024).  Carries a dx of OSA, (CPAP intolerant w/ several failed attempts)  Also has had complicated cardiac hx w/ several admits for VT now has AICD. Admitted 8/14 w/ cc: 2 d h/o shortness of breath, NP cough and 3 lb wt gain. His initial PCXR was c/w pulmonary edema and also showed chronic calcified right pleural thickening w/ pleural plaque. His COVID was negative, PCT negative, trop I negative, BNP 684, pulse ox 50s on 5 lpm. Supplemental oxygen was titrated up, he was administered IV lasix, and admitted w/ working dx of acute on chronic hypoxic resp failure 2/2 acute on chronic HF w/ pulmonary edema.  Hospital course 8/14 admitted. Escalated O2 and lasix 8/15 worsening renal fxn. Lasix held. Breathing a little better.  ECHO EF 25-30% w/ global hypokinesis and severely decreased RV systolic fxn. Also mod elevated PAS w/ RA and LA dilation, (reflected decreased EF from June 2024, and RV was mod reduced at that point)  8/16 sats down to 81% on 8 lpm when ambulating.  8/17 cards consulted. Recommended aggressive diuresis in spite of rising scr. BP soft side so recommended holding Jardiance, ACE/ARB/ARNI/MRA 8/18 feeling better. Still w/ exertional dyspnea  8/19 feeling about same as 8/18 still had orthopnea and LE edema -2.9 liters. Again plan was to continue IV diuresis for acute biventricular HF.  Got CT chest that showed small left effusion, prob edema and left subseg atx, calcified pleural plaque  on right hemithorax, severe emphysema changes and prior CABG changes.  As of 8/20 he remains on 8 lpm  still gets severely short of breath w/ activity and from a cardiac stand-point he is on lasix 60mg  bid,  lopressor and imdur. Because of his sig exertional dyspnea and hypoxia we were asked to evaluate.   Pertinent  Medical History  CAD w/ Prior MI, prior CABG, HFrEF (35%), afib on DOAC, Chronic hypoxic resp failure. 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 Prior TIA, OSA  Significant Hospital Events: Including procedures, antibiotic start and stop dates in addition to other pertinent events   8/14 admitted w/ acute HF and pulm edema  8/20 Pulm asked to see for progressive hypoxia.   Interim History / Subjective:  Reports he is doing well on nebulized inhalers and prefers this Has needed increased O2 rangingfrom 6-10L. Currently satting 100% on 10 L but desats with movement Net negative 7L since admission. Started on PO lasix yesterday  Objective   Blood pressure (!) 101/56, pulse 75, temperature 97.7 F (36.5 C), resp. rate 20, height 5\' 11"  (1.803 m), weight 100.9 kg, SpO2 92%.        Intake/Output Summary (Last 24 hours) at 09/05/2022 0802 Last data filed at 09/05/2022 0416 Gross per 24 hour  Intake 240 ml  Output 1000 ml  Net -760 ml   Filed Weights   09/03/22 0539 09/04/22 0457 09/05/22 0414  Weight: 99 kg 98.5 kg 100.9 kg   Physical Exam: General: Chronically ill-appearing, no acute distress HENT: [place], AT, OP clear, MMM Eyes: EOMI, no scleral icterus Respiratory: Diminished air entry bilaterally.  Cardiovascular: Irregular rate and rhythm, -M/R/G, no JVD Extremities:-Edema,-tenderness Neuro: AAO x4, CNII-XII grossly intact Psych: Normal mood, normal affect  CT Chest 09/01/22 - Severe emphysema,  bilateral diffuse ground glass opacities, calcified right pleural plaques, rounded subpleural opacities, small left pleural effusion   Creatinine continues to improve and tolerate diuresis. BUN/Cr 21/1.48  Resolved Hospital Problem list     Assessment & Plan:   Acute on chronic hypoxemic respiratory failure 2/2 pulmonary edema Advanced/end stage COPD - unable to tolerate NIV Acute on chronic biventricular heart failure Secondary pulmonary hypertension OSA not on CPAP Asbestos related pleural calcification  Counseled patient on limited options from a pulmonary standpoint given his advanced lung disease. He would benefit from NIV however unable to tolerate per patient. Discussed plans to optimize inhaler regimen with nebulizers and continue diuresis for his heart failure but if this fails and his medical issues continue to progress, would recommend hospice. Patient is amenable to this idea if he remains status quo. Previously on 5L at home and now requiring 6-10L. --CXR ordered  --Agree with diuresis as tolerated --Wean O2 for goal >88-92%.  --Recommend oximizer as his ongoing O2 needs are suspected to range from 6 at rest and possibly >10L with exertion  Will attempt to obtain this inpatient. Case manager reports able to obtain at discharge. Primary team please follow-up on this. --Continue nebulizer regimen while inpatient and at discharge:  DC Dulera and Spiriva Continue arformoterol 15 mcg BID Continue budesonide 0.5 mg BID Continue Yupelri 175 mcg daily --OOB, sitting in chair, PT/OT, IS --Palliative consulted. Currently DNR  Pulmonary will intermittently follow  Best Practice (right click and "Reselect all SmartList Selections" daily)   Per primary    Critical care time: NA    Care Time 25 min  Mechele Collin, MD [facility] Pulmonary/Critical Care Medicine 09/05/2022 8:02 AM   See Amion for personal pager For hours between 7 PM to 7 AM, please call Elink for urgent  questions

## 2022-09-05 NOTE — Progress Notes (Signed)
SATURATION QUALIFICATIONS: (This note is used to comply with regulatory documentation for home oxygen)  Patient Saturations on 10 Liters of oxygen while Ambulating = 84%  Please briefly explain why patient needs home oxygen: Not safe for patient to come completely off O2. Patient is on 10L, at rest O2sat is 93%. With ambulation and O2 his sats drop to 84%.

## 2022-09-05 NOTE — Progress Notes (Signed)
  Progress Note   Patient: Max Rivas PIR:[id number] DOB: 26-Feb-1950 DOA: 08/26/2022     9 DOS: the patient was seen and examined on 09/05/2022   Brief hospital course:  Brief Narrative:  72 year old with history of CAD status post MI with CABG, systolic CHF EF 35%, A-fib on Eliquis, chronic hypoxia on O2, COPD presents to the ED with worsening shortness of breath for the past 2 days. He is admitted to the hospital with concerns of CHF exacerbation. Chest x-ray showed cardiomegaly with central venous congestion/pulmonary edema. Started on IV diuretics now changing to PO. 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  Class III -Now on 6L oxymizer.  Echocardiogram shows EF of 30%.  Cardiology team following, diuretics and GDMT per their service. - CT scan shows severe emphysematous disease with bilateral groundglass opacity. -Pulm following -Palliative Care following   Hypokalemia -Normalized   AKI on CKD stage II -Baseline creatinine 1.3, peaked at 1.83, slowly improving, this morning 1.48   Acute urinary retention secondary to BPH - Coud catheter had to be placed due to retention.  Will attempt to remove this.  He will need outpatient urology follow-up.   History of CAD status post CABG -Currently chest pain-free.  Currently on aspirin, Eliquis, statin.  Rest of the medications per cardiology team.   History of atrial fibrillation, paroxysmal -On amiodarone, Toprol-XL and Eliquis   COPD with chronic hypoxia - Bronchodilators as needed.  I-S/flutter CT scan showed severe emphysema with bilateral groundglass opacity.  Seen by pulmonary.  Unfortunately patient is at end-stage COPD and overall will require higher oxygen.  By pulmonary, palliative care team also consulted.  Patient is now DNR.  Arranging extra home oxygen at home.  Patient used to be on 5 L nasal cannula at  home, currently on 6L oxymizer    GERD -Protonix   History of mood disorder - On amitriptyline   Hypothyroidism - Synthroid   Subjective: Reports feeling more sob when sitting up with oxymizer. Denies chest pains  Physical Exam: Vitals:   09/05/22 0744 09/05/22 1132 09/05/22 1345 09/05/22 1400  BP:   96/68 116/77  Pulse:   91   Resp:   16   Temp:   (!) 97.5 F (36.4 C)   TempSrc:   Oral   SpO2: 92% 93% 90% (!) 85%  Weight:      Height:       General exam: Awake, laying in bed, in nad Respiratory system: Increased respiratory effort, no wheezing Cardiovascular system: regular rate, s1, s2 Gastrointestinal system: Soft, nondistended, positive BS Central nervous system: CN2-12 grossly intact, strength intact Extremities: Perfused, no clubbing Skin: Normal skin turgor, no notable skin lesions seen Psychiatry: Mood normal // no visual hallucinations   Data Reviewed:  Labs reviewed: Na 136, K 4.2, Cr 1.48, WBC 6.2, Hgb 12.8  Family Communication: Pt in room, family not at bedside  Disposition: Status is: Inpatient Remains inpatient appropriate because: severity of illness  Planned Discharge Destination: Home    Author: Rickey Barbara, MD 09/05/2022 6:26 PM  For on call review [email or web address].

## 2022-09-05 NOTE — Progress Notes (Signed)
Patient was standing up without calling for assistance, trying to move the locked chair to retrieve his phone. Upon entering room, patient cyanotic around lips, and finger tips. Patient on [facility] optimizer, O2 sats 69-70%. Increased to 15LNC, notified Rapid Response as well as Respiratory. Placed patient on NRB 15L 100% as well. Patient rebounded from low 70s to 90%. Notified also MD Rhona Leavens that slowly rebounded but still requiring 14 to 15L HFNC. Patient alert and oriented x4, cyanosis has resolved around oral mucosa and fingertips. Beside RN, Sheryle Hail, will report off to night shift RN about plan to wean down O2 when patient can tolerate.

## 2022-09-05 NOTE — Plan of Care (Signed)
  Problem: Clinical Measurements: Goal: Respiratory complications will improve Outcome: Progressing   Problem: Activity: Goal: Risk for activity intolerance will decrease Outcome: Progressing   Problem: Coping: Goal: Level of anxiety will decrease Outcome: Progressing   

## 2022-09-06 DIAGNOSIS — Z515 Encounter for palliative care: Secondary | ICD-10-CM

## 2022-09-06 DIAGNOSIS — I509 Heart failure, unspecified: Secondary | ICD-10-CM | POA: Diagnosis not present

## 2022-09-06 DIAGNOSIS — J9621 Acute and chronic respiratory failure with hypoxia: Secondary | ICD-10-CM | POA: Diagnosis not present

## 2022-09-06 DIAGNOSIS — Z7189 Other specified counseling: Secondary | ICD-10-CM

## 2022-09-06 LAB — BASIC METABOLIC PANEL
Anion gap: 9 (ref 5–15)
BUN: 18 mg/dL (ref 8–23)
CO2: 27 mmol/L (ref 22–32)
Calcium: 8.2 mg/dL — ABNORMAL LOW (ref 8.9–10.3)
Chloride: 99 mmol/L (ref 98–111)
Creatinine, Ser: 1.4 mg/dL — ABNORMAL HIGH (ref 0.61–1.24)
GFR, Estimated: 54 mL/min — ABNORMAL LOW (ref >60)
Glucose, Bld: 88 mg/dL (ref 70–99)
Potassium: 3.5 mmol/L (ref 3.5–5.1)
Sodium: 135 mmol/L (ref 135–145)

## 2022-09-06 LAB — CBC
HCT: 41.2 % (ref 39.0–52.0)
Hemoglobin: 12.6 g/dL — ABNORMAL LOW (ref 13.0–17.0)
MCH: 28.6 pg (ref 26.0–34.0)
MCHC: 30.6 g/dL (ref 30.0–36.0)
MCV: 93.4 fL (ref 80.0–100.0)
Platelets: 167 10*3/uL (ref 150–400)
RBC: 4.41 MIL/uL (ref 4.22–5.81)
RDW: 15.4 % (ref 11.5–15.5)
WBC: 6.3 10*3/uL (ref 4.0–10.5)
nRBC: 0 % (ref 0.0–0.2)

## 2022-09-06 LAB — MAGNESIUM: Magnesium: 2.3 mg/dL (ref 1.7–2.4)

## 2022-09-06 NOTE — Progress Notes (Signed)
PCCM Progress Note  Patient planning to pursue hospice. Pulmonary will sign off.

## 2022-09-06 NOTE — Progress Notes (Signed)
Daily Progress Note   Patient Name: Max Rivas       Date: 09/06/2022 DOB: [birth year]  Age: 72 y.o. MRN#: [id number] Attending Physician: Jerald Kief, MD Primary Care Physician: [facility] Admit Date: 08/26/2022  Reason for Consultation/Follow-up: Establishing goals of care  Subjective:  Awake alert Overnight events noted.  Rapid response notes reviewed Escalating O2 requirements.  Now on 13 L HF [place].    Length of Stay: 10  Current Medications: Scheduled Meds:   amiodarone  200 mg Oral Daily   amitriptyline  25 mg Oral QHS   apixaban  5 mg Oral BID   arformoterol  15 mcg Nebulization BID   aspirin EC  81 mg Oral Daily   budesonide  0.5 mg Nebulization BID   Chlorhexidine Gluconate Cloth  6 each Topical Daily   cholecalciferol  2,000 Units Oral Daily   cyclobenzaprine  10 mg Oral QPC breakfast   famotidine  20 mg Oral QHS   furosemide  40 mg Oral BID   gabapentin  200 mg Oral BID   levothyroxine  100 mcg Oral QAC breakfast   montelukast  10 mg Oral QHS   multivitamin  1 tablet Oral Daily   multivitamin with minerals  1 tablet Oral Q breakfast   pantoprazole  40 mg Oral BID   revefenacin  175 mcg Nebulization Daily   rosuvastatin  20 mg Oral QHS   sodium chloride flush  3 mL Intravenous Q12H    Continuous Infusions:  sodium chloride      PRN Meds: sodium chloride, acetaminophen, hydrALAZINE, levalbuterol, metoprolol tartrate, nitroGLYCERIN, ondansetron (ZOFRAN) IV, mouth rinse, senna-docusate, sodium chloride flush, traZODone  Physical Exam         Awake alert No edema No distress currently Regular work of breathing, able to speak in full sentences Regular  Vital Signs: BP 101/68 (BP Location: Left Arm)   Pulse 90   Temp (!) 97.5 F (36.4 C)  (Oral)   Resp 20   Ht 5\' 11"  (1.803 m)   Wt 100 kg   SpO2 91%   BMI 30.75 kg/m  SpO2: SpO2: 91 % O2 Device: O2 Device: High Flow Nasal Cannula O2 Flow Rate: O2 Flow Rate (L/min): 13 L/min  Intake/output summary:  Intake/Output Summary (Last 24 hours) at 09/06/2022 1008 Last  data filed at 09/06/2022 0800 Gross per 24 hour  Intake 600 ml  Output 1700 ml  Net -1100 ml   LBM: Last BM Date : 09/05/22 (per pt) Baseline Weight: Weight: 106.6 kg Most recent weight: Weight: 100 kg       Palliative Assessment/Data:      Patient Active Problem List   Diagnosis Date Noted   CHF (congestive heart failure) (HCC) 08/27/2022   Acute on chronic hypoxic respiratory failure (HCC) 08/26/2022   Acute on chronic respiratory failure with hypoxia (HCC) 07/07/2022   Essential hypertension 07/07/2022   DNR (do not resuscitate) 07/07/2022   PAF (paroxysmal atrial fibrillation) (HCC) 07/07/2022   TIA (transient ischemic attack) 06/24/2021   Hypokalemia 06/24/2021   Sepsis (HCC) 04/25/2021   Ventricular tachyarrhythmia (HCC) 12/07/2019   Cardiomyopathy, ischemic    Vomiting    Acute respiratory failure with hypoxia (HCC) 09/18/2019   Acute on chronic heart failure (HCC) 09/08/2019   Unstable angina (HCC) 05/21/2019   Ventricular tachycardia (HCC) 05/12/2019   Chest pain    VT (ventricular tachycardia) (HCC) 05/11/2019   Arrhythmia 03/12/2019   On home O2    Chronic HFrEF (heart failure with reduced ejection fraction) (HCC) 06/22/2018   Chronic respiratory failure with hypoxia (HCC) 06/22/2018   Congestive heart failure (CHF) (HCC) 06/22/2018   DCM (dilated cardiomyopathy) (HCC) 05/25/2017   Coronary artery disease 05/25/2017   Chronic atrial fibrillation 05/25/2017   COPD (chronic obstructive pulmonary disease) (HCC) 05/25/2017   CKD (chronic kidney disease) 05/25/2017   Hypothyroidism 05/25/2017   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/2017   OSA (obstructive  sleep apnea) 05/25/2017   Pulmonary HTN (HCC) 05/25/2017   Mediastinal adenopathy    Cervical adenopathy    Acute respiratory failure with hypoxia and hypercarbia (HCC) 02/24/2016   Pulmonary embolus (HCC) 02/24/2016   Pulmonary artery thrombosis (HCC)    Pleural effusion    Hypoxia    Lung mass    Pleural plaque    Flutter-fibrillation (HCC) 01/15/2016   Acute systolic congestive heart failure (HCC)    Atrial fibrillation with RVR (HCC)    Hypercholesterolemia 03/11/2006   Tobacco abuse 03/11/2006   Acute on chronic systolic congestive heart failure (HCC) 03/11/2006   GASTROESOPHAGEAL REFLUX, NO ESOPHAGITIS 03/11/2006   OSTEOARTHRITIS, MULTI SITES 03/11/2006   HIGH RISK PATIENT 03/11/2006   Tobacco dependence 03/11/2006    Palliative Care Assessment & Plan   Patient Profile:    Assessment: 72 year old gentleman with 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 Patient admitted to hospital medicine service with acute hypoxic respiratory distress, acute congestive heart failure with reduced ejection fraction 30% class III, also with COPD with chronic hypoxia.  Palliative consulted for goals of care discussions.  Recommendations/Plan:  DNR Goals of care discussions undertaken with the patient. He is aware of the serious incurable nature of his overall condition, from escalating lung and heart conditions. I introduced hospice philosophy of care.  Patient is in agreement to proceed with home with hospice services on discharge, leave ICD on for now, patient wants to see how he feels when he gets home. He states  he is spiritually at peace and knows that he has escalating O2 requirements.  Recommend home with hospice, will place [facility] consult.      Code Status:    Code Status Orders  (From admission, onward)  Start     Ordered   08/27/22 2117  Do not  attempt resuscitation (DNR)  Continuous       Question Answer Comment  If patient has no pulse and is not breathing Do Not Attempt Resuscitation   If patient has a pulse and/or is breathing: Medical Treatment Goals LIMITED ADDITIONAL INTERVENTIONS: Use medication/IV fluids and cardiac monitoring as indicated; Do not use intubation or mechanical ventilation (DNI), also provide comfort medications.  Transfer to Progressive/Stepdown as indicated, avoid Intensive Care.   Consent: Discussion documented in EHR or advanced directives reviewed      08/27/22 2117           Code Status History     Date Active Date Inactive Code Status Order ID Comments User Context   08/26/2022 2041 08/27/2022 2117 Full Code [id number]  Tonye Royalty, DO ED   07/07/2022 1658 07/11/2022 1642 DNR [id number]  Jonah Blue, MD ED   06/24/2021 0636 06/25/2021 2353 Partial Code [id number]  John Giovanni, MD ED   04/25/2021 0437 04/28/2021 1915 Full Code [id number]  Eduard Clos, MD ED   03/18/2020 1822 03/19/2020 1955 Full Code [id number]  Graciella Freer, PA-C ED   01/13/2020 0543 01/14/2020 2001 Full Code [id number]  Chevis Pretty, MD ED   12/31/2019 1835 01/01/2020 1828 Full Code [id number]  Dellia Cloud, MD ED   12/07/2019 0336 12/07/2019 1601 Full Code [id number]  Elmon Kirschner, MD ED   09/18/2019 1522 09/23/2019 2018 Full Code [id number]  Lanier Clam, NP ED   09/08/2019 2000 09/11/2019 2221 DNR [id number]  Elige Radon, MD ED   05/21/2019 1024 06/01/2019 1635 Full Code [id number]  Barrett, Joline Salt, PA-C ED   05/11/2019 1605 05/16/2019 0008 Full Code [id number]  Sheilah Pigeon, PA-C ED   03/12/2019 2157 03/13/2019 1847 DNR [id number]  Rosario Jacks, MD Inpatient   03/12/2019 1854 03/12/2019 2157 Full Code [id number]  Rosario Jacks, MD ED   06/22/2018 0410 06/23/2018 1651 DNR [id number]  Hillary Bow, DO ED   02/24/2016 0614 03/05/2016 1645 DNR [id number]  Eduard Clos, MD ED    01/16/2016 0813 01/19/2016 1454 DNR [id number]  Rhetta Mura, MD Inpatient   01/15/2016 2031 01/16/2016 0813 Full Code [id number]  Rhetta Mura, MD Inpatient       Prognosis:  Less than 6 months is possible.   Discharge Planning: Home with Hospice  Care plan was discussed with patient  Thank you for allowing the Palliative Medicine Team to assist in the care of this patient.  High MDM     Greater than 50%  of this time was spent counseling and coordinating care related to the above assessment and plan.  Rosalin Hawking, MD  Please contact Palliative Medicine Team phone at [phone] for questions and concerns.

## 2022-09-06 NOTE — Plan of Care (Signed)

## 2022-09-06 NOTE — Progress Notes (Signed)
  Progress Note   Patient: Max Rivas ZOX:[id number] DOB: Apr 25, 1950 DOA: 08/26/2022     10 DOS: the patient was seen and examined on 09/06/2022   Brief hospital course:  Brief Narrative:  72 year old with history of CAD status post MI with CABG, systolic CHF EF 35%, A-fib on Eliquis, chronic hypoxia on O2, COPD presents to the ED with worsening shortness of breath for the past 2 days. He is admitted to the hospital with concerns of CHF exacerbation. Chest x-ray showed cardiomegaly with central venous congestion/pulmonary edema. Started on IV diuretics now changing to PO. 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  Class III -Echocardiogram shows EF of 30%.  Cardiology team following, diuretics and GDMT per their service. - CT scan shows severe emphysematous disease with bilateral groundglass opacity. -Pt had been continued on oxymizer, still with significant O2 requirement -Palliative Care following. Decision was made to transition to home with hospice -Pulmonary had been following. Discussed with PCCM who agreed with hospice and has since signed off   Hypokalemia -Normalized   AKI on CKD stage II -Baseline creatinine 1.3, peaked at 1.83, slowly improving, down to 1.40   Acute urinary retention secondary to BPH - Coud catheter had to be placed due to retention.  Will attempt to remove this.  He will need outpatient urology follow-up.   History of CAD status post CABG -Currently chest pain-free.  Currently on aspirin, Eliquis, statin.  Rest of the medications per cardiology team.   History of atrial fibrillation, paroxysmal -On amiodarone, Toprol-XL and Eliquis   COPD with chronic hypoxia - Bronchodilators as needed.  I-S/flutter CT scan showed severe emphysema with bilateral groundglass opacity.  Seen by pulmonary.  Unfortunately patient is at end-stage COPD and  overall will require higher oxygen.  By pulmonary, palliative care team also consulted.  Patient is now DNR.   -Now on oxymizer    GERD -Protonix   History of mood disorder - On amitriptyline   Hypothyroidism - Synthroid   Subjective: Reports continued sob with even minimal exertion  Physical Exam: Vitals:   09/06/22 0758 09/06/22 0855 09/06/22 1249 09/06/22 1500  BP:   97/76   Pulse:  90 92   Resp:  20 20   Temp:   97.6 F (36.4 C)   TempSrc:   Oral   SpO2: 94% 91% 99% 99%  Weight:      Height:       General exam: Conversant, in no acute distress Respiratory system: normal chest rise, clear, no audible wheezing Cardiovascular system: regular rhythm, s1-s2 Gastrointestinal system: Nondistended, nontender, pos BS Central nervous system: No seizures, no tremors Extremities: No cyanosis, no joint deformities Skin: No rashes, no pallor Psychiatry: Affect normal // no auditory hallucinations   Data Reviewed:  Labs reviewed: Na 135, K 3.5, Cr 1.40, WBC 6.3, Hgb 12.6, Plts 167  Family Communication: Pt in room, family not at bedside  Disposition: Status is: Inpatient Remains inpatient appropriate because: severity of illness  Planned Discharge Destination: Home    Author: Rickey Barbara, MD 09/06/2022 5:01 PM  For on call review [email or web address].

## 2022-09-07 DIAGNOSIS — J9621 Acute and chronic respiratory failure with hypoxia: Secondary | ICD-10-CM | POA: Diagnosis not present

## 2022-09-07 DIAGNOSIS — I509 Heart failure, unspecified: Secondary | ICD-10-CM | POA: Diagnosis not present

## 2022-09-07 LAB — CBC
HCT: 40.4 % (ref 39.0–52.0)
Hemoglobin: 12.2 g/dL — ABNORMAL LOW (ref 13.0–17.0)
MCH: 28.7 pg (ref 26.0–34.0)
MCHC: 30.2 g/dL (ref 30.0–36.0)
MCV: 95.1 fL (ref 80.0–100.0)
Platelets: 163 10*3/uL (ref 150–400)
RBC: 4.25 MIL/uL (ref 4.22–5.81)
RDW: 15.4 % (ref 11.5–15.5)
WBC: 6.5 10*3/uL (ref 4.0–10.5)
nRBC: 0 % (ref 0.0–0.2)

## 2022-09-07 LAB — BASIC METABOLIC PANEL
Anion gap: 7 (ref 5–15)
BUN: 15 mg/dL (ref 8–23)
CO2: 29 mmol/L (ref 22–32)
Calcium: 8.3 mg/dL — ABNORMAL LOW (ref 8.9–10.3)
Chloride: 101 mmol/L (ref 98–111)
Creatinine, Ser: 1.41 mg/dL — ABNORMAL HIGH (ref 0.61–1.24)
GFR, Estimated: 54 mL/min — ABNORMAL LOW (ref >60)
Glucose, Bld: 107 mg/dL — ABNORMAL HIGH (ref 70–99)
Potassium: 3.5 mmol/L (ref 3.5–5.1)
Sodium: 137 mmol/L (ref 135–145)

## 2022-09-07 LAB — MAGNESIUM: Magnesium: 2.3 mg/dL (ref 1.7–2.4)

## 2022-09-07 MED ORDER — HYDROCOD POLI-CHLORPHE POLI ER 10-8 MG/5ML PO SUER
5.0000 mL | Freq: Two times a day (BID) | ORAL | Status: DC
  Administered 2022-09-07 – 2022-09-08 (×3): 5 mL via ORAL
  Filled 2022-09-07 (×3): qty 5

## 2022-09-07 NOTE — Plan of Care (Signed)
  Problem: Clinical Measurements: Goal: Respiratory complications will improve Outcome: Progressing   Problem: Activity: Goal: Risk for activity intolerance will decrease Outcome: Progressing   Problem: Coping: Goal: Level of anxiety will decrease Outcome: Progressing   

## 2022-09-07 NOTE — Care Management Important Message (Signed)
Important Message  Patient Details No IM Letter given due to Discharging with Hospice. Name: Max Rivas MRN: [id number] Date of Birth: [birth year]   Medicare Important Message Given:  No     Caren Macadam 09/07/2022, 2:31 PM

## 2022-09-07 NOTE — Progress Notes (Signed)
Daily Progress Note   Patient Name: Max Rivas       Date: 09/07/2022 DOB: 08/30/1950  Age: 72 y.o. MRN#: [id number] Attending Physician: Jerald Kief, MD Primary Care Physician: [facility] Admit Date: 08/26/2022  Reason for Consultation/Follow-up: Establishing goals of care  Subjective:  Awake alert  Still  on 13 L HF [place].    Length of Stay: 11  Current Medications: Scheduled Meds:   amiodarone  200 mg Oral Daily   amitriptyline  25 mg Oral QHS   apixaban  5 mg Oral BID   arformoterol  15 mcg Nebulization BID   aspirin EC  81 mg Oral Daily   budesonide  0.5 mg Nebulization BID   Chlorhexidine Gluconate Cloth  6 each Topical Daily   cholecalciferol  2,000 Units Oral Daily   cyclobenzaprine  10 mg Oral QPC breakfast   famotidine  20 mg Oral QHS   furosemide  40 mg Oral BID   gabapentin  200 mg Oral BID   levothyroxine  100 mcg Oral QAC breakfast   montelukast  10 mg Oral QHS   multivitamin  1 tablet Oral Daily   multivitamin with minerals  1 tablet Oral Q breakfast   pantoprazole  40 mg Oral BID   revefenacin  175 mcg Nebulization Daily   rosuvastatin  20 mg Oral QHS   sodium chloride flush  3 mL Intravenous Q12H    Continuous Infusions:  sodium chloride      PRN Meds: sodium chloride, acetaminophen, hydrALAZINE, levalbuterol, metoprolol tartrate, nitroGLYCERIN, ondansetron (ZOFRAN) IV, mouth rinse, senna-docusate, sodium chloride flush, traZODone  Physical Exam         Awake alert No edema No distress currently Regular work of breathing, able to speak in full sentences Regular  Vital Signs: BP 99/62 (BP Location: Left Arm)   Pulse 78   Temp 97.8 F (36.6 C) (Oral)   Resp 16   Ht 5\' 11"  (1.803 m)   Wt 100.5 kg   SpO2 95%   BMI 30.90 kg/m   SpO2: SpO2: 95 % O2 Device: O2 Device: High Flow Nasal Cannula (salter) O2 Flow Rate: O2 Flow Rate (L/min): 13 L/min  Intake/output summary:  Intake/Output Summary (Last 24 hours) at 09/07/2022 1158 Last data filed at 09/07/2022 0900 Gross per 24 hour  Intake 720 ml  Output 1375 ml  Net -655 ml   LBM: Last BM Date : 09/05/22 (per pt) Baseline Weight: Weight: 106.6 kg Most recent weight: Weight: 100.5 kg       Palliative Assessment/Data:      Patient Active Problem List   Diagnosis Date Noted   CHF (congestive heart failure) (HCC) 08/27/2022   Acute on chronic hypoxic respiratory failure (HCC) 08/26/2022   Acute on chronic respiratory failure with hypoxia (HCC) 07/07/2022   Essential hypertension 07/07/2022   DNR (do not resuscitate) 07/07/2022   PAF (paroxysmal atrial fibrillation) (HCC) 07/07/2022   TIA (transient ischemic attack) 06/24/2021   Hypokalemia 06/24/2021   Sepsis (HCC) 04/25/2021   Ventricular tachyarrhythmia (HCC) 12/07/2019   Cardiomyopathy, ischemic    Vomiting    Acute respiratory failure with hypoxia (HCC) 09/18/2019   Acute on chronic heart failure (HCC) 09/08/2019   Unstable angina (HCC) 05/21/2019   Ventricular tachycardia (HCC) 05/12/2019   Chest pain    VT (ventricular tachycardia) (HCC) 05/11/2019   Arrhythmia 03/12/2019   On home O2    Chronic HFrEF (heart failure with reduced ejection fraction) (HCC) 06/22/2018   Chronic respiratory failure with hypoxia (HCC) 06/22/2018   Congestive heart failure (CHF) (HCC) 06/22/2018   DCM (dilated cardiomyopathy) (HCC) 05/25/2017   Coronary artery disease 05/25/2017   Chronic atrial fibrillation 05/25/2017   COPD (chronic obstructive pulmonary disease) (HCC) 05/25/2017   CKD (chronic kidney disease) 05/25/2017   Hypothyroidism 05/25/2017   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/2017   OSA (obstructive sleep apnea) 05/25/2017   Pulmonary HTN (HCC) 05/25/2017   Mediastinal  adenopathy    Cervical adenopathy    Acute respiratory failure with hypoxia and hypercarbia (HCC) 02/24/2016   Pulmonary embolus (HCC) 02/24/2016   Pulmonary artery thrombosis (HCC)    Pleural effusion    Hypoxia    Lung mass    Pleural plaque    Flutter-fibrillation (HCC) 01/15/2016   Acute systolic congestive heart failure (HCC)    Atrial fibrillation with RVR (HCC)    Hypercholesterolemia 03/11/2006   Tobacco abuse 03/11/2006   Acute on chronic systolic congestive heart failure (HCC) 03/11/2006   GASTROESOPHAGEAL REFLUX, NO ESOPHAGITIS 03/11/2006   OSTEOARTHRITIS, MULTI SITES 03/11/2006   HIGH RISK PATIENT 03/11/2006   Tobacco dependence 03/11/2006    Palliative Care Assessment & Plan   Patient Profile:    Assessment: 72 year old gentleman with 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 Patient admitted to hospital medicine service with acute hypoxic respiratory distress, acute congestive heart failure with reduced ejection fraction 30% class III, also with COPD with chronic hypoxia.  Palliative consulted for goals of care discussions.  Recommendations/Plan:  DNR Goals of care discussions undertaken with the patient. He is aware of the serious incurable nature of his overall condition, from escalating lung and heart conditions. I introduced hospice philosophy of care.  Patient is in agreement to proceed with home with hospice services on discharge, leave ICD on for now, patient wants to see how he feels when he gets home. He states  he is spiritually at peace and knows that he has escalating O2 requirements.  Recommend home with hospice, will place [facility] consult.  09-07-22: Discussed with patient and TOC. Monitor hospital course. Patient with cough and worsening shortness of breath, talked about PO opioids like morphine solution, he doesn't want to start it  today,  states that he hasn't taken anything stronger than Tylenol, will continue to monitor his symptoms.     Code Status:    Code Status Orders  (From admission, onward)           Start     Ordered   08/27/22 2117  Do not attempt resuscitation (DNR)  Continuous       Question Answer Comment  If patient has no pulse and is not breathing Do Not Attempt Resuscitation   If patient has a pulse and/or is breathing: Medical Treatment Goals LIMITED ADDITIONAL INTERVENTIONS: Use medication/IV fluids and cardiac monitoring as indicated; Do not use intubation or mechanical ventilation (DNI), also provide comfort medications.  Transfer to Progressive/Stepdown as indicated, avoid Intensive Care.   Consent: Discussion documented in EHR or advanced directives reviewed      08/27/22 2117           Code Status History     Date Active Date Inactive Code Status Order ID Comments User Context   08/26/2022 2041 08/27/2022 2117 Full Code [id number]  Tonye Royalty, DO ED   07/07/2022 1658 07/11/2022 1642 DNR [id number]  Jonah Blue, MD ED   06/24/2021 0636 06/25/2021 2353 Partial Code [id number]  John Giovanni, MD ED   04/25/2021 0437 04/28/2021 1915 Full Code [id number]  Eduard Clos, MD ED   03/18/2020 1822 03/19/2020 1955 Full Code [id number]  Graciella Freer, PA-C ED   01/13/2020 0543 01/14/2020 2001 Full Code [id number]  Chevis Pretty, MD ED   12/31/2019 1835 01/01/2020 1828 Full Code [id number]  Dellia Cloud, MD ED   12/07/2019 0336 12/07/2019 1601 Full Code [id number]  Elmon Kirschner, MD ED   09/18/2019 1522 09/23/2019 2018 Full Code [id number]  Lanier Clam, NP ED   09/08/2019 2000 09/11/2019 2221 DNR [id number]  Elige Radon, MD ED   05/21/2019 1024 06/01/2019 1635 Full Code [id number]  Barrett, Joline Salt, PA-C ED   05/11/2019 1605 05/16/2019 0008 Full Code [id number]  Sheilah Pigeon, PA-C ED   03/12/2019 2157 03/13/2019 1847 DNR [id number]  Rosario Jacks, MD Inpatient    03/12/2019 1854 03/12/2019 2157 Full Code [id number]  Rosario Jacks, MD ED   06/22/2018 0410 06/23/2018 1651 DNR [id number]  Hillary Bow, DO ED   02/24/2016 0614 03/05/2016 1645 DNR [id number]  Eduard Clos, MD ED   01/16/2016 0813 01/19/2016 1454 DNR [id number]  Rhetta Mura, MD Inpatient   01/15/2016 2031 01/16/2016 0813 Full Code [id number]  Rhetta Mura, MD Inpatient       Prognosis:  Less than 6 months is possible.   Discharge Planning: Home with Hospice  Care plan was discussed with patient  Thank you for allowing the Palliative Medicine Team to assist in the care of this patient.  mod MDM     Greater than 50%  of this time was spent counseling and coordinating care related to the above assessment and plan.  Rosalin Hawking, MD  Please contact Palliative Medicine Team phone at [phone] for questions and concerns.

## 2022-09-07 NOTE — Progress Notes (Addendum)
[facility] Liaison RN note  Received request from [facility] for hospice services at home after discharge. Chart and patient information under review by Hospice physician.   Spoke with patient to initiate education related to hospice philosophy, services, and team approach to care. Patient/family verbalized understanding of information given. Per discussion, the plan is for discharge home by EMS today once DME is in the home.   DME needs discussed. Patient has the following equipment in the home. Oxygen through [employer]. Patient/family requests the following equipment for deliver: Oxygen switch out to support 13L HFNC.  Address has been verified and is correct in the chart. Debbie at [phone] is the family contact to arrange time of equipment delivery.   Please send signed and completed DNR with patient/family. Please provide prescriptions at discharge as needed for ongoing symptom management.   [facility] information and contact numbers given to patient.  Above information shared with Olegario Messier. Please call with any hospice related questions or concerns.   Thank you for the opportunity to participate in this patient's care.  Thea Gist, Charity fundraiser, BSN [employer] [phone]

## 2022-09-07 NOTE — TOC Progression Note (Addendum)
Transition of Care [facility]) - Progression Note    Patient Details  Name: Max Rivas MRN: [id number] Date of Birth: [birth year]  Transition of Care [facility]) CM/SW Contact  Bonnye Halle, Olegario Messier, RN Phone Number: 09/07/2022, 9:30 AM  Clinical Narrative:   Referral for home hosice-patient agrees to [facility]-rep Misty to eval for home hospice acceptance-Current.y has [employer] home 02-will need travel tank to rm.Has own transportation home. -12p-Per [facility] rep Misty-will need PTAR for home transportation-she will arrange home 02, & contact Debbie Huffman(roommate) for elivery arrangements @ [phone]. Once dme in home PTAR to be called. DNR.Confirmed address.Forms @ nsg station.Nsg to call PTAR if after 3p once dme in the home.   Expected Discharge Plan: Home w Hospice Care Barriers to Discharge: Continued Medical Work up  Expected Discharge Plan and Services   Discharge Planning Services: CM Consult Post Acute Care Choice: Resumption of Svcs/PTA Provider [facility] 02.) Living arrangements for the past 2 months: Apartment                                       Social Determinants of Health (SDOH) Interventions SDOH Screenings   Food Insecurity: No Food Insecurity (09/01/2022)  Housing: Low Risk  (09/01/2022)  Transportation Needs: No Transportation Needs (09/01/2022)  Utilities: Not At Risk (08/27/2022)  Alcohol Screen: Low Risk  (09/01/2022)  Financial Resource Strain: Low Risk  (09/01/2022)  Social Connections: Unknown (05/23/2021)   Received from [facility]  Tobacco Use: Medium Risk (08/27/2022)    Readmission Risk Interventions    06/25/2021    9:40 AM 04/28/2021   10:57 AM  Readmission Risk Prevention Plan  Transportation Screening Complete Complete  PCP or Specialist Appt within 3-5 Days Complete Complete  HRI or Home Care Consult Complete Complete  Social Work Consult for Recovery Care Planning/Counseling Complete Complete  Palliative Care Screening Not Applicable  Not Applicable  Medication Review Oceanographer) Complete Complete

## 2022-09-07 NOTE — Progress Notes (Signed)
Heart Failure Navigator Progress Note  Assessed for Heart & Vascular TOC clinic readiness.  Patient is going home with hospice, so have cancelled [facility] appointment.   Navigator will sign off at this time.   Reem Fleury,RN, BSN,MSN Heart Failure Nurse Navigator. Contact by secure chat only.

## 2022-09-07 NOTE — Progress Notes (Signed)
  Progress Note   Patient: Max Rivas ZOX:[id number] DOB: Apr 09, 1950 DOA: 08/26/2022     11 DOS: the patient was seen and examined on 09/07/2022   Brief hospital course:  Brief Narrative:  72 year old with history of CAD status post MI with CABG, systolic CHF EF 35%, A-fib on Eliquis, chronic hypoxia on O2, COPD presents to the ED with worsening shortness of breath for the past 2 days. He is admitted to the hospital with concerns of CHF exacerbation. Chest x-ray showed cardiomegaly with central venous congestion/pulmonary edema. Started on IV diuretics now changing to PO. 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  Class III -Echocardiogram shows EF of 30%.  Cardiology team following, diuretics and GDMT per their service. - CT scan shows severe emphysematous disease with bilateral groundglass opacity. -Pt had been continued on oxymizer, still with significant O2 requirement -Palliative Care following. Decision was made to transition to home with hospice -Pulmonary had been following, now signed off -Awaiting DME to be delivered to pt's home   Hypokalemia -Within normal limits   AKI on CKD stage II -Baseline creatinine 1.3, peaked at 1.83, slowly improving, down to 1.40   Acute urinary retention secondary to BPH - Coud catheter had to be placed due to retention.    History of CAD status post CABG -Currently chest pain-free.  Currently on aspirin, Eliquis, statin.  Rest of the medications per cardiology team.   History of atrial fibrillation, paroxysmal -On amiodarone, Toprol-XL and Eliquis   COPD with chronic hypoxia - Bronchodilators as needed.  I-S/flutter CT scan showed severe emphysema with bilateral groundglass opacity.  Seen by pulmonary.  Unfortunately patient is at end-stage COPD and overall will require higher oxygen.  By pulmonary, palliative care team also  consulted.  Patient is now DNR.   -Now on oxymizer    GERD -Protonix   History of mood disorder - On amitriptyline   Hypothyroidism - Synthroid   Subjective: Complained of increased sob when checking O2 sats earlier. Complaining of cough  Physical Exam: Vitals:   09/07/22 0441 09/07/22 0447 09/07/22 0740 09/07/22 1321  BP:  99/62  (!) 139/90  Pulse:  78  (!) 105  Resp:  16  20  Temp:  97.8 F (36.6 C)  97.7 F (36.5 C)  TempSrc:  Oral  Oral  SpO2:  96% 95% 90%  Weight: 100.5 kg     Height:       General exam: Awake, laying in bed, in nad Respiratory system: Normal respiratory effort, no wheezing Cardiovascular system: regular rate, s1, s2 Gastrointestinal system: Soft, nondistended, positive BS Central nervous system: CN2-12 grossly intact, strength intact Extremities: Perfused, no clubbing Skin: Normal skin turgor, no notable skin lesions seen Psychiatry: Mood normal // no visual hallucinations   Data Reviewed:  Labs reviewed: Na 137, K 3.5, Cr 1.41, WBC 6.5, Hgb 12.2, Plts 163  Family Communication: Pt in room, family not at bedside  Disposition: Status is: Inpatient Remains inpatient appropriate because: severity of illness  Planned Discharge Destination: Home    Author: Rickey Barbara, MD 09/07/2022 6:17 PM  For on call review [email or web address].

## 2022-09-08 ENCOUNTER — Inpatient Hospital Stay (HOSPITAL_COMMUNITY): Payer: Medicare Other

## 2022-09-08 DIAGNOSIS — J9621 Acute and chronic respiratory failure with hypoxia: Secondary | ICD-10-CM | POA: Diagnosis not present

## 2022-09-08 DIAGNOSIS — Z79899 Other long term (current) drug therapy: Secondary | ICD-10-CM

## 2022-09-08 DIAGNOSIS — Z515 Encounter for palliative care: Secondary | ICD-10-CM

## 2022-09-08 DIAGNOSIS — Z66 Do not resuscitate: Secondary | ICD-10-CM

## 2022-09-08 DIAGNOSIS — R0602 Shortness of breath: Secondary | ICD-10-CM

## 2022-09-08 DIAGNOSIS — I509 Heart failure, unspecified: Secondary | ICD-10-CM | POA: Diagnosis not present

## 2022-09-08 DIAGNOSIS — Z7189 Other specified counseling: Secondary | ICD-10-CM

## 2022-09-08 LAB — BASIC METABOLIC PANEL
Anion gap: 8 (ref 5–15)
BUN: 15 mg/dL (ref 8–23)
CO2: 31 mmol/L (ref 22–32)
Calcium: 8.3 mg/dL — ABNORMAL LOW (ref 8.9–10.3)
Chloride: 97 mmol/L — ABNORMAL LOW (ref 98–111)
Creatinine, Ser: 1.48 mg/dL — ABNORMAL HIGH (ref 0.61–1.24)
GFR, Estimated: 51 mL/min — ABNORMAL LOW (ref >60)
Glucose, Bld: 101 mg/dL — ABNORMAL HIGH (ref 70–99)
Potassium: 4.5 mmol/L (ref 3.5–5.1)
Sodium: 136 mmol/L (ref 135–145)

## 2022-09-08 LAB — CBC
HCT: 41.1 % (ref 39.0–52.0)
Hemoglobin: 12.7 g/dL — ABNORMAL LOW (ref 13.0–17.0)
MCH: 29.3 pg (ref 26.0–34.0)
MCHC: 30.9 g/dL (ref 30.0–36.0)
MCV: 94.7 fL (ref 80.0–100.0)
Platelets: 164 10*3/uL (ref 150–400)
RBC: 4.34 MIL/uL (ref 4.22–5.81)
RDW: 15.6 % — ABNORMAL HIGH (ref 11.5–15.5)
WBC: 6.2 10*3/uL (ref 4.0–10.5)
nRBC: 0 % (ref 0.0–0.2)

## 2022-09-08 LAB — MAGNESIUM: Magnesium: 2.3 mg/dL (ref 1.7–2.4)

## 2022-09-08 MED ORDER — HALOPERIDOL LACTATE 5 MG/ML IJ SOLN: 0.5000 mg | INTRAMUSCULAR | Status: DC | PRN

## 2022-09-08 MED ORDER — BIOTENE DRY MOUTH MT LIQD: 15.0000 mL | OROMUCOSAL | Status: DC | PRN

## 2022-09-08 MED ORDER — FUROSEMIDE 40 MG PO TABS: 40.0000 mg | ORAL_TABLET | Freq: Two times a day (BID) | ORAL | Status: AC

## 2022-09-08 MED ORDER — AMIODARONE HCL 200 MG PO TABS: 200.0000 mg | ORAL_TABLET | Freq: Every day | ORAL | Status: AC

## 2022-09-08 MED ORDER — GLYCOPYRROLATE 1 MG PO TABS: 1.0000 mg | ORAL_TABLET | ORAL | Status: DC | PRN

## 2022-09-08 MED ORDER — GLYCOPYRROLATE 1 MG PO TABS: 1.0000 mg | ORAL_TABLET | ORAL | Status: AC | PRN

## 2022-09-08 MED ORDER — POLYVINYL ALCOHOL 1.4 % OP SOLN: 1.0000 [drp] | Freq: Four times a day (QID) | OPHTHALMIC | Status: DC | PRN

## 2022-09-08 MED ORDER — GLYCOPYRROLATE 0.2 MG/ML IJ SOLN: 0.2000 mg | INTRAMUSCULAR | Status: DC | PRN

## 2022-09-08 MED ORDER — MORPHINE SULFATE (CONCENTRATE) 20 MG/ML PO SOLN: 10.0000 mg | ORAL | Status: AC | PRN

## 2022-09-08 MED ORDER — FUROSEMIDE 40 MG PO TABS: 40.0000 mg | ORAL_TABLET | Freq: Two times a day (BID) | ORAL | Status: DC

## 2022-09-08 MED ORDER — SENNOSIDES-DOCUSATE SODIUM 8.6-50 MG PO TABS: 1.0000 | ORAL_TABLET | Freq: Every evening | ORAL | Status: AC | PRN

## 2022-09-08 MED ORDER — FUROSEMIDE 10 MG/ML IJ SOLN
60.0000 mg | INTRAMUSCULAR | Status: AC
  Administered 2022-09-08: 60 mg via INTRAVENOUS
  Filled 2022-09-08: qty 6

## 2022-09-08 MED ORDER — HALOPERIDOL 0.5 MG PO TABS: 0.5000 mg | ORAL_TABLET | ORAL | Status: DC | PRN

## 2022-09-08 MED ORDER — HYDROMORPHONE 1 MG/ML IV SOLN
INTRAVENOUS | Status: DC
  Administered 2022-09-08: 30 mg via INTRAVENOUS
  Filled 2022-09-08: qty 30

## 2022-09-08 MED ORDER — HALOPERIDOL LACTATE 2 MG/ML PO CONC: 0.5000 mg | ORAL | Status: DC | PRN

## 2022-09-08 MED ORDER — HYDROCOD POLI-CHLORPHE POLI ER 10-8 MG/5ML PO SUER: 5.0000 mL | Freq: Two times a day (BID) | ORAL | Status: AC

## 2022-09-08 NOTE — Significant Event (Signed)
Rapid Response Event Note   Reason for Call :  Desaturation to 60s  Initial Focused Assessment:  Pt sitting on edge of bed, breathing treatment in progress. Lung sounds diminished bilaterally, increased work of breathing. RT at bedside. Satting mid 70s on 15L HFNC, HR 120s. At completion of breathing treatment, pt placed on NRB at 15L. O2 saturations improved to 90s. NRB removed after a few minutes of sats in the 90s, pt desatted back to 70s within five minutes. NRB reapplied. Pt work of breathing significantly improved. Per MD, plan is for pt to d/c w/ hospice in next few days.      Interventions:  CXR  Plan of Care:  Lasix ordered by MD, bedside RN to administer and wean O2 as tolerated   Event Summary:   MD Notified: Dr. Rhona Leavens Call Time: 0808 Arrival Time: 7829 End Time: 0845  Donnita Falls, RN

## 2022-09-08 NOTE — Progress Notes (Signed)
Daily Progress Note   Patient Name: Max Rivas       Date: 09/08/2022 DOB: [birth year]  Age: 72 y.o. MRN#: [id number] Attending Physician: Jerald Kief, MD Primary Care Physician: [facility] Admit Date: 08/26/2022 Length of Stay: 12 days  Reason for Consultation/Follow-up: Establishing goals of care and symptom management  Subjective:   CC: Patient noting waxing and waning dyspnea this morning after acute worsening.  Following up regarding complex medical decision making and symptom management.  Subjective:  EMR prior to presenting to bedside.  Also discussed care with IDT due to patient's acute worsening of status this morning requiring evaluation by rapid team.  Patient's dyspnea worsened requiring placement of nonrebreather to support O2 sat.  Plan had been for patient to go home with hospice in a few days.  ------------------------------------------------------------------------------------------------------------- Advance Care Planning Conversation  Pertinent diagnosis: CAD status post CABG, acute on chronic HFrEF with EF of 30% status post ICD, A-fib on Eliquis, COPD, chronic hypoxia on O2   The patient and/or family consented to a voluntary Advance Care Planning Conversation. Individuals present for the conversation: patient, patient's roommate-Max Rivas via speaker phone, this palliative provider  Summary of the conversation:  Presented to bedside to meet with patient.  Introduced myself as a member of the palliative medicine team.  Patient still on nonrebreather at this time laying in bed with increased work of breathing.  Patient does feel his sensation regarding shortness of breath has improved after multiple inhalers this morning though still notes increased work of breathing.  Discussed plan of care moving forward for patient's management.  Plan had been for patient to go home with hospice.  Patient was going to have his roommate supportive care though his  roommate is around his age and physically disabled.  Discussed can transition to comfort focused care here in the hospital to determine best next support as concerned about patient going home with acute worsening of dyspnea.  Explained transition to comfort focused care would entail such as discontinuing imaging, lab work, and I believe fluids, and instead providing medications for pain, shortness of breath, nausea and vomiting, etc.  Also discussed turning off patient's ICD as ICD firing would not be comfortable at the end of life to which patient agreed.  Patient agreement with transition to comfort focused care at this time.  Of note, patient's roommate, Max Rivas, was on the phone during conversation and agreed with supporting patient with his transition to comfort focused care here in the hospital.  Outcome of the conversations and/or documents completed:  Transition to comfort focused care at this time.  I spent 26 minutes providing separately identifiable ACP services with the patient and/or surrogate decision maker in a voluntary, in-person conversation discussing the patient's wishes and goals as detailed in the above note.  Alvester Morin, DO Palliative Care Provider  -------------------------------------------------------------------------------------------------------------  Discussed management of dyspnea with opioids in the setting of end-of-life care.  Patient not currently receiving opioids for management.  Discussed with patient's acute worsening this morning, will consider starting patient on PCA to allow for more aggressive symptom management.  Explained in detail PCA and appropriate use.  Patient agreeable to starting PCA at this time.  All questions answered at that time.  Noted palliative medicine team to continue to follow along with patient's medical journey.  Discussed care with IDT after visit with patient including [facility] liaison who planned to discuss admission to [facility]    Review of Systems Shortness of breath Objective:  Vital Signs:  BP (!) 159/95 (BP Location: Left Arm)   Pulse (!) 106   Temp 97.6 F (36.4 C) (Oral)   Resp 18   Ht 5\' 11"  (1.803 m)   Wt 100.9 kg   SpO2 100%   BMI 31.02 kg/m   Physical Exam: General: alert, lying in bed, increased work of breathing noted Eyes: No drainage noted HENT: moist mucous membranes Cardiovascular: Tachycardia noted Respiratory:increased work of breathing noted with accessory muscle use, on nonrebreather O2 Neuro: A&Ox4, following commands easily Psych: appropriately answers all questions  Imaging:  I personally reviewed recent imaging.   Assessment & Plan:   Assessment: Patient is a 72 year old male with a past medical history of CAD status post CABG, HFrEF with EF of 30% status post ICD, A-fib on Eliquis, COPD, and chronic hypoxia on O2 5 L at home who was admitted on 08/26/2022 for management of acute hypoxic respiratory failure in setting of acute on chronic CHF.  Palliative medicine team consulted to assist with complex medical decision making and symptom management.  Recommendations/Plan: # Complex medical decision making/goals of care:  -Discussed care with patient while roommate, Max Rivas, present on speaker phone as detailed above in HPI.  Due to patient's rapidly worsening shortness of breath this morning, patient agreeing with transition to comfort focused care in the hospital at this time.  Patient agreement with turning off ICD at this time.  Plan had been for patient to go home with hospice in a few days though concerned about ability to support patient's symptom management at home.  [facility] hospice already involved in evaluating.  -At this time we will discontinue interventions that are no longer focused on comfort such as IV fluids, imaging, or lab work.  Will instead focus on symptom management of pain, dyspnea, and agitation in the setting of end-of-life care.  - Code Status: DNR    # Symptom management:  -Dyspnea, acute on chronic in the setting of HFrEF and COPD at the end of life       -Start IV Dilaudid PCA at 0.5 mg IV every 10 mins as needed.  Continue to adjust based on patient's symptom burden.  If patient needing frequent dosing, may need to consider continuous dosing rate.                  -Anxiety/agitation, in the setting of end-of-life care                               -Start Haldol 0.5 mg as needed. Continue to adjust based on patient's symptom burden.                   -Secretions, in the setting of end-of-life care                               -Start  glycopyrrolate as needed.  # Psychosocial Support:  -Roommate-Max Rivas  # Discharge Planning: To Be Determined  -[facility] evaluating for potential [facility]PU admission  Discussed with: Hospitalist, patient, patient's roommate-Debbie, RN, ACC liaison, TOC  Thank you for allowing the palliative care team to participate in the care [name].  Alvester Morin, DO Palliative Care Provider PMT # [phone]  If patient remains symptomatic despite maximum doses, please call PMT at [phone] between 0700 and 1900. Outside of these hours, please call attending, as PMT does not have  night coverage.  *Please note that this is a verbal dictation therefore any spelling or grammatical errors are due to the "[employer] One" system interpretation.

## 2022-09-08 NOTE — Plan of Care (Signed)
  Problem: Clinical Measurements: Goal: Will remain free from infection Outcome: Progressing Goal: Respiratory complications will improve Outcome: Progressing   Problem: Coping: Goal: Level of anxiety will decrease Outcome: Progressing   Problem: Pain Managment: Goal: General experience of comfort will improve Outcome: Adequate for Discharge

## 2022-09-08 NOTE — Plan of Care (Signed)
  Problem: Clinical Measurements: Goal: Will remain free from infection Outcome: Progressing   Problem: Nutrition: Goal: Adequate nutrition will be maintained Outcome: Progressing   Problem: Safety: Goal: Ability to remain free from injury will improve Outcome: Progressing   Problem: Skin Integrity: Goal: Risk for impaired skin integrity will decrease Outcome: Progressing   Problem: Clinical Measurements: Goal: Will remain free from infection Outcome: Progressing   Problem: Nutrition: Goal: Adequate nutrition will be maintained Outcome: Progressing   Problem: Safety: Goal: Ability to remain free from injury will improve Outcome: Progressing   Problem: Skin Integrity: Goal: Risk for impaired skin integrity will decrease Outcome: Progressing

## 2022-09-08 NOTE — TOC Transition Note (Addendum)
Transition of Care [facility]) - CM/SW Discharge Note   Patient Details  Name: Max Rivas MRN: [id number] Date of Birth: May 12, 1950  [facility] CM/SW Contact:  Lanier Clam, RN Phone Number: 09/08/2022, 2:21 PM   Clinical Narrative: d/c [facility] today awaiting  when PTAR can be called. All forms @ nsg station. No further CM needs.  -2:41p-[facility] rep ready for patient to come-report tel#[phone].PTAR called. No further CM needs.    Final next level of care: Hospice Medical Facility Barriers to Discharge: No Barriers Identified   Patient Goals and CMS Choice CMS Medicare.gov Compare Post Acute Care list provided to:: Patient Choice offered to / list presented to : Patient  Discharge Placement                Patient chooses bed at: Other - please specify in the comment section below: [facility]) Patient to be transferred to facility by: PTAR Name of family member notified: Debbie Huffman(roommate) [phone] Patient and family notified of of transfer: 09/08/22  Discharge Plan and Services Additional resources added to the After Visit Summary for     Discharge Planning Services: CM Consult Post Acute Care Choice: Resumption of Svcs/PTA Provider [facility] 02.)                               Social Determinants of Health (SDOH) Interventions SDOH Screenings   Food Insecurity: No Food Insecurity (09/01/2022)  Housing: Low Risk  (09/01/2022)  Transportation Needs: No Transportation Needs (09/01/2022)  Utilities: Not At Risk (08/27/2022)  Alcohol Screen: Low Risk  (09/01/2022)  Financial Resource Strain: Low Risk  (09/01/2022)  Social Connections: Unknown (05/23/2021)   Received from [facility]  Tobacco Use: Medium Risk (08/27/2022)     Readmission Risk Interventions    06/25/2021    9:40 AM 04/28/2021   10:57 AM  Readmission Risk Prevention Plan  Transportation Screening Complete Complete  PCP or Specialist Appt within 3-5 Days  Complete Complete  HRI or Home Care Consult Complete Complete  Social Work Consult for Recovery Care Planning/Counseling Complete Complete  Palliative Care Screening Not Applicable Not Applicable  Medication Review Oceanographer) Complete Complete

## 2022-09-08 NOTE — TOC Progression Note (Addendum)
Transition of Care [facility]) - Progression Note    Patient Details  Name: Max Rivas MRN: [id number] Date of Birth: 02-03-1950  Transition of Care [facility]) CM/SW Contact  Lindamarie Maclachlan, Olegario Messier, RN Phone Number: 09/08/2022, 10:25 AM  Clinical Narrative:  Noted medical decline-palliative following-await recc.  -10;27a-Palliative are-full comfort.Will continue to follow. [facility] will continue continue to follow for dme/home w/hospice if that will be the d/c plan & if another d/c plan is appropriate for them to manage.    Expected Discharge Plan:  (TBD) Barriers to Discharge: Continued Medical Work up  Expected Discharge Plan and Services   Discharge Planning Services: CM Consult Post Acute Care Choice: Resumption of Svcs/PTA Provider [facility] 02.) Living arrangements for the past 2 months: Apartment                                       Social Determinants of Health (SDOH) Interventions SDOH Screenings   Food Insecurity: No Food Insecurity (09/01/2022)  Housing: Low Risk  (09/01/2022)  Transportation Needs: No Transportation Needs (09/01/2022)  Utilities: Not At Risk (08/27/2022)  Alcohol Screen: Low Risk  (09/01/2022)  Financial Resource Strain: Low Risk  (09/01/2022)  Social Connections: Unknown (05/23/2021)   Received from [facility]  Tobacco Use: Medium Risk (08/27/2022)    Readmission Risk Interventions    06/25/2021    9:40 AM 04/28/2021   10:57 AM  Readmission Risk Prevention Plan  Transportation Screening Complete Complete  PCP or Specialist Appt within 3-5 Days Complete Complete  HRI or Home Care Consult Complete Complete  Social Work Consult for Recovery Care Planning/Counseling Complete Complete  Palliative Care Screening Not Applicable Not Applicable  Medication Review Oceanographer) Complete Complete

## 2022-09-08 NOTE — Progress Notes (Signed)
Pt was complaining about SOB.Pt was on 13L HFNC.Pt was sitting on side of bed and his O2 in low 60's.Called respiratory therapist.Increased oxygen to 15L.After treatment still O2 was in low 60's.Notified rapid response nurse.Received new orders.Placed pt on NRB 15L 100% as well.Patient rebounded from 62% to 100%Also notified MD Rhona Leavens.Received new orders(See Mar/order history). Pt is alert and oriented x4.Checked vitals.

## 2022-09-08 NOTE — Progress Notes (Signed)
Pt left for [facility].Report has been called.Left IV in place.PCA dilaudid wasted at [facility] 4 E2 pyxis with [facility].30mg  wasted.

## 2022-09-08 NOTE — Progress Notes (Signed)
Max Rivas [address] [facility] Liaison RN note   Patient had change in status/deterioration overnight requiring O2 via NRB at 15L and PCA dilaudid for dyspnea. Introduced [employer] and able to offer bed for GIP care at [facility], which patient accepted. Consents have been signed and patient should discharge to [facility] today.   Thank you for the opportunity to participate in the care of this patient.   Thea Gist BSN, [employer] hospital liaison [phone]

## 2022-09-08 NOTE — Discharge Summary (Addendum)
Physician Discharge Summary   Patient: Max Rivas MRN: [id number] DOB: 08-Oct-1950  Admit date:     08/26/2022  Discharge date: 09/08/22  Discharge Physician: Rickey Barbara   PCP: [facility]   Recommendations at discharge:    Follow up with hospice services  Discharge Diagnoses: Principal Problem:   Acute on chronic hypoxic respiratory failure (HCC) Active Problems:   Hypoxia   CHF (congestive heart failure) (HCC)   Palliative care encounter   Shortness of breath   High risk medication use   Goals of care, counseling/discussion   Counseling and coordination of care  Resolved Problems:   * No resolved hospital problems. *  Hospital Course:  Brief Narrative:  72 year old with history of CAD status post MI with CABG, systolic CHF EF 35%, A-fib on Eliquis, chronic hypoxia on O2, COPD presents to the ED with worsening shortness of breath for the past 2 days. He is admitted to the hospital with concerns of CHF exacerbation. Chest x-ray showed cardiomegaly with central venous congestion/pulmonary edema. Started on IV diuretics now changing to PO. 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  Class III -Echocardiogram shows EF of 30%.  Cardiology team following, diuretics and GDMT per their service. - CT scan shows severe emphysematous disease with bilateral groundglass opacity. -Pt had been continued on oxymizer, still with significant O2 requirement -Palliative Care following. Decision was made to transition to home with hospice -Pulmonary had been following, now signed off -Pt is now rapidly declining with markedly increased o2 requirements. Pt was seen again by Palliative Care and decision was made to transition to full comfort. Plan to d/c to residential hospice   Hypokalemia -Within normal limits   AKI on CKD stage II -Baseline creatinine 1.3,  peaked at 1.83, slowly improved   Acute urinary retention secondary to BPH - Coud catheter had to be placed due to retention.    History of CAD status post CABG -was on aspirin, Eliquis, statin.  Rest of the medications per cardiology team. -Later transitioned to comfort   History of atrial fibrillation, paroxysmal -On amiodarone, Toprol-XL and Eliquis   COPD with chronic hypoxia - Bronchodilators as needed.  I-S/flutter CT scan showed severe emphysema with bilateral groundglass opacity.  Seen by pulmonary.  Unfortunately patient is at end-stage COPD and overall will require higher oxygen.   -Patient later seen by Palliative Care. With rapid decline, decision was made to transition to full comfort. Plan to discharge to residential hospice   GERD -Protonix   History of mood disorder - On amitriptyline   Hypothyroidism - Synthroid        Consultants: Cardiology, Pulmonary, Palliative Care Procedures performed:   Disposition: Hospice care Diet recommendation:  Regular diet DISCHARGE MEDICATION: Allergies as of 09/08/2022       Reactions   Crestor [rosuvastatin Calcium] Other (See Comments)   Back spasms, initially, but can tolerate it at a low dose now, in 2022 Tolerating 20mg  QD   Lipitor [atorvastatin] Other (See Comments)   Spasticity   Ventolin [albuterol] Palpitations, Other (See Comments)   Irregular heartbeat        Medication List     STOP taking these medications    aspirin EC 81 MG tablet   empagliflozin 10 MG Tabs tablet Commonly known as: JARDIANCE   Fish Oil 1000 MG Caps   isosorbide mononitrate 30 MG 24 hr tablet Commonly  known as: IMDUR   lidocaine 5 % Commonly known as: LIDODERM   Magnesium 200 MG Chew   metoprolol succinate 25 MG 24 hr tablet Commonly known as: TOPROL-XL   multivitamin with minerals Tabs tablet   potassium chloride SA 20 MEQ tablet Commonly known as: KLOR-CON M   PreserVision AREDS 2 Caps   rosuvastatin 40  MG tablet Commonly known as: CRESTOR   Vitamin D3 50 MCG (2000 UT) Tabs       TAKE these medications    acetaminophen 500 MG tablet Commonly known as: TYLENOL Take 1,000 mg by mouth every 8 (eight) hours as needed for moderate pain, headache or mild pain.   amiodarone 200 MG tablet Commonly known as: PACERONE Take 1 tablet (200 mg total) by mouth daily. Start taking on: September 09, 2022 What changed: See the new instructions.   amitriptyline 25 MG tablet Commonly known as: ELAVIL Take 25 mg by mouth at bedtime.   apixaban 5 MG Tabs tablet Commonly known as: ELIQUIS Take 5 mg by mouth 2 (two) times daily.   carboxymethylcellulose 1 % ophthalmic solution Place 1 drop into both eyes 4 (four) times daily.   chlorpheniramine-HYDROcodone 10-8 MG/5ML Commonly known as: TUSSIONEX Take 5 mLs by mouth every 12 (twelve) hours.   cyclobenzaprine 10 MG tablet Commonly known as: FLEXERIL Take 10 mg by mouth daily after breakfast.   furosemide 40 MG tablet Commonly known as: LASIX Take 1 tablet (40 mg total) by mouth 2 (two) times daily. Start taking on: September 09, 2022 What changed:  medication strength how much to take   gabapentin 100 MG capsule Commonly known as: NEURONTIN Take 2 capsules (200 mg total) by mouth 2 (two) times daily.   glycopyrrolate 1 MG tablet Commonly known as: ROBINUL Take 1 tablet (1 mg total) by mouth every 4 (four) hours as needed (excessive secretions).   levalbuterol 45 MCG/ACT inhaler Commonly known as: XOPENEX HFA Inhale 2 puffs into the lungs every 6 (six) hours as needed for wheezing or shortness of breath.   levothyroxine 100 MCG tablet Commonly known as: SYNTHROID Take 100 mcg by mouth daily before breakfast.   mometasone 220 MCG/INH inhaler Commonly known as: ASMANEX Inhale 2 puffs into the lungs 2 (two) times daily.   montelukast 10 MG tablet Commonly known as: SINGULAIR Take 10 mg by mouth at bedtime.   morphine 20 MG/ML  concentrated solution Commonly known as: ROXANOL Take 0.5 mLs (10 mg total) by mouth every 3 (three) hours as needed for severe pain or shortness of breath.   nitroGLYCERIN 0.4 MG SL tablet Commonly known as: NITROSTAT Place 0.4 mg under the tongue every 5 (five) minutes as needed for chest pain.   omeprazole 20 MG capsule Commonly known as: PRILOSEC Take 40 mg by mouth daily.   OXYGEN Inhale 4 L into the lungs continuous.   senna-docusate 8.6-50 MG tablet Commonly known as: Senokot-S Take 1 tablet by mouth at bedtime as needed for moderate constipation.   Tiotropium Bromide-Olodaterol 2.5-2.5 MCG/ACT Aers Inhale 2 puffs into the lungs daily.               Durable Medical Equipment  (From admission, onward)           Start     Ordered   09/04/22 1342  For home use only DME oxygen  Once       Comments: Needs Oxymizer  Dx: End stage COPD with severe hypoxia (<80% on Room air, needing about 8-10L  [place])  Question Answer Comment  Length of Need Lifetime   Mode or (Route) Nasal cannula   Liters per Minute 10   Frequency Continuous (stationary and portable oxygen unit needed)   Oxygen conserving device Yes   Oxygen delivery system Gas      09/04/22 1342   09/04/22 1336  For home use only DME Other see comment  Once       Comments: oxymizer  Question:  Length of Need  Answer:  Lifetime   09/04/22 1335            Follow-up Information     [facility]. Go in 6 day(s).   Specialty: Cardiology Why: Hospital follow upo 09/09/2022@ 2 pm PLEASE bring a current medication list to appointment FREE valet parking, Entrance C, off [employer] information: [address] [place] [address] [phone]        f/u with hospice as needed Follow up.                 Discharge Exam: Filed Weights   09/06/22 0500 09/07/22 0441 09/08/22 0555  Weight: 100 kg 100.5 kg 100.9 kg    General exam: Awake, laying in bed Respiratory system: Increased respiratory effort, decreased BS Cardiovascular system: regular rate, s1, s2 Gastrointestinal system: Soft, nondistended, positive BS Central nervous system: CN2-12 grossly intact, strength intact Extremities: Perfused, no clubbing Skin: Normal skin turgor, no notable skin lesions seen Psychiatry: Mood normal // no visual hallucinations   Condition at discharge: poor  The results of significant diagnostics from this hospitalization (including imaging, microbiology, ancillary and laboratory) are listed below for reference.   Imaging Studies: DG CHEST PORT 1 VIEW  Result Date: 09/08/2022 CLINICAL DATA:  Shortness of breath EXAM: PORTABLE CHEST 1 VIEW COMPARISON:  09/05/2022 FINDINGS: Cardiomegaly status post median sternotomy with left chest multi lead pacer defibrillator and atrial appendage clip. Diffuse bilateral interstitial pulmonary opacity and layering bilateral pleural effusions, unchanged, with calcification about the right hemithorax. Osseous structures unremarkable. IMPRESSION: Cardiomegaly with diffuse bilateral interstitial pulmonary opacity and layering bilateral pleural effusions, unchanged, consistent with edema or infection. No new or focal airspace opacity. Electronically Signed   By: Jearld Lesch M.D.   On: 09/08/2022 12:19   DG CHEST PORT 1 VIEW  Result Date: 09/05/2022 CLINICAL DATA:  Shortness of breath and hypoxia. COPD, known calcified pleural plaques. EXAM: PORTABLE CHEST 1 VIEW COMPARISON:  08/28/2022 FINDINGS: AICD noted. Blunting of both costophrenic angles compatible with small bilateral pleural effusions. Some of the pleural density is accounted for by known dense pleural calcifications, especially notable on the right side, query prior right pleurodesis, hemothorax, or empyema. Emphysema. Faint hazy reticular interstitial accentuation especially at the lung bases. Mild cardiomegaly. Atherosclerosis.  Prior CABG. Indistinct pulmonary vasculature favoring pulmonary venous hypertension. Atrial appendage clip noted. IMPRESSION: 1. Small bilateral pleural effusions. Dense chronic right pleural calcifications. 2. Mild cardiomegaly with pulmonary venous hypertension. 3. Aortic Atherosclerosis (ICD10-I70.0) and Emphysema (ICD10-J43.9). Prior CABG. Electronically Signed   By: Gaylyn Rong M.D.   On: 09/05/2022 13:47   CT CHEST WO CONTRAST  Result Date: 09/01/2022 CLINICAL DATA:  Interstitial lung disease EXAM: CT CHEST WITHOUT CONTRAST TECHNIQUE: Multidetector CT imaging of the chest was performed following the standard protocol without IV contrast. RADIATION DOSE REDUCTION: This exam was performed according to the departmental dose-optimization program which includes automated exposure control, adjustment of the mA and/or kV according to patient size and/or use of  iterative reconstruction technique. COMPARISON:  Chest CT dated June 28th 2024 FINDINGS: Cardiovascular: Cardiomegaly. No pericardial effusion. Normal caliber thoracic aorta with moderate calcified plaque. Severe left main and three-vessel coronary artery calcifications status post CABG. Left chest wall ICD with leads in the right atrium. Calcifications of the lateral wall of the left ventricle, likely sequela of prior infarct. Mediastinum/Nodes: Esophagus and thyroid are unremarkable. No enlarged lymph nodes seen in the chest. Lungs/Pleura: Central airways are patent. Severe centrilobular emphysema. Mild diffuse ground-glass opacities, similar to prior exam. Calcified right pleural plaques, likely sequela of prior hemothorax or empyema. Rounded subpleural opacities of the right lower, unchanged when compared with the prior exam lobe. Stable small left pleural effusion. No traction bronchiectasis or honeycomb change. Upper Abdomen: No acute abnormality. Musculoskeletal: No chest wall mass or suspicious bone lesions identified. IMPRESSION: 1. No  evidence of interstitial lung disease. 2. Mild diffuse ground-glass opacities, likely due to pulmonary edema. 3. Stable small left pleural effusion. 4. Calcified right pleural plaques, likely sequela of prior empyema or hemothorax. 5. Severe aortic Atherosclerosis (ICD10-I70.0) and Emphysema (ICD10-J43.9). Electronically Signed   By: Allegra Lai M.D.   On: 09/01/2022 14:47   DG Chest Port 1 View  Result Date: 08/28/2022 CLINICAL DATA:  Dyspnea. EXAM: PORTABLE CHEST 1 VIEW COMPARISON:  Radiographs 08/26/2022.  CT 07/10/2022. FINDINGS: 0902 hours. Left subclavian AICD leads appear unchanged, projecting to the level of the right ventricular apex. The heart size and mediastinal contours are stable status post median sternotomy, CABG and left atrial appendage clipping. Chronic right-sided pleural thickening and calcifications are unchanged. Interval improvement in the aeration of the lungs with decreased patchy bibasilar opacities. No evidence of pneumothorax or significant pleural effusion. No acute osseous findings. IMPRESSION: Interval improved aeration of the lungs with decreased bibasilar opacities. No acute cardiopulmonary process. Electronically Signed   By: Carey Bullocks M.D.   On: 08/28/2022 12:53   ECHOCARDIOGRAM COMPLETE  Result Date: 08/27/2022    ECHOCARDIOGRAM REPORT   Patient Name:   ALEXANDAR STOWELL Date of Exam: 08/27/2022 Medical Rec #:  [id number]      Height:       71.0 in Accession #:    [id number]     Weight:       216.5 lb Date of Birth:  02-Jul-1950      BSA:          2.181 m Patient Age:    72 years       BP:           105/80 mmHg Patient Gender: M              HR:           74 bpm. Exam Location:  Inpatient Procedure: 2D Echo, Cardiac Doppler, Color Doppler and Intracardiac            Opacification Agent Indications:    I50.40* Unspecified combined systolic (congestive) and diastolic                 (congestive) heart failure  History:        Patient has prior history of Echocardiogram  examinations, most                 recent 06/18/2022. Cardiomyopathy and CHF, Abnormal ECG and                 Defibrillator, Pulmonary HTN and TIA, Arrythmias:Atrial  Fibrillation and VT, Signs/Symptoms:Shortness of Breath, Dyspnea                 and Chest Pain; Risk Factors:Hypertension, Diabetes,                 Dyslipidemia and Current Smoker.  Sonographer:    Sheralyn Boatman RDCS Referring Phys: [id number] ALEXIS HUGELMEYER  Sonographer Comments: Technically difficult study due to poor echo windows. Image acquisition challenging due to patient body habitus. IMPRESSIONS  1. Left ventricular ejection fraction, by estimation, is 25 to 30%. The left ventricle has severely decreased function. The left ventricle demonstrates global hypokinesis. The left ventricular internal cavity size was severely dilated. Left ventricular diastolic parameters are indeterminate.  2. Right ventricular systolic function is severely reduced. The right ventricular size is severely enlarged. There is moderately elevated pulmonary artery systolic pressure. The estimated right ventricular systolic pressure is 56.7 mmHg.  3. Left atrial size was moderately dilated.  4. Right atrial size was moderately dilated.  5. The mitral valve is degenerative. Mild to moderate mitral valve regurgitation. No evidence of mitral stenosis.  6. The aortic valve is tricuspid. Aortic valve regurgitation is not visualized. No aortic stenosis is present.  7. The inferior vena cava is dilated in size with <50% respiratory variability, suggesting right atrial pressure of 15 mmHg. FINDINGS  Left Ventricle: Left ventricular ejection fraction, by estimation, is 25 to 30%. The left ventricle has severely decreased function. The left ventricle demonstrates global hypokinesis. Definity contrast agent was given IV to delineate the left ventricular endocardial borders. The left ventricular internal cavity size was severely dilated. There is no left ventricular  hypertrophy. Left ventricular diastolic parameters are indeterminate. Right Ventricle: The right ventricular size is severely enlarged. Right vetricular wall thickness was not well visualized. Right ventricular systolic function is severely reduced. There is moderately elevated pulmonary artery systolic pressure. The tricuspid regurgitant velocity is 3.23 m/s, and with an assumed right atrial pressure of 15 mmHg, the estimated right ventricular systolic pressure is 56.7 mmHg. Left Atrium: Left atrial size was moderately dilated. Right Atrium: Right atrial size was moderately dilated. Pericardium: There is no evidence of pericardial effusion. Presence of epicardial fat layer. Mitral Valve: The mitral valve is degenerative in appearance. Mild to moderate mitral valve regurgitation. No evidence of mitral valve stenosis. MV peak gradient, 71.2 mmHg. The mean mitral valve gradient is 40.0 mmHg. Tricuspid Valve: The tricuspid valve is grossly normal. Tricuspid valve regurgitation is mild . No evidence of tricuspid stenosis. Aortic Valve: The aortic valve is tricuspid. Aortic valve regurgitation is not visualized. No aortic stenosis is present. Pulmonic Valve: The pulmonic valve was not well visualized. Pulmonic valve regurgitation is mild. No evidence of pulmonic stenosis. Aorta: The aortic root and ascending aorta are structurally normal, with no evidence of dilitation. Venous: The inferior vena cava is dilated in size with less than 50% respiratory variability, suggesting right atrial pressure of 15 mmHg. IAS/Shunts: No atrial level shunt detected by color flow Doppler. Additional Comments: A device lead is visualized.  LEFT VENTRICLE PLAX 2D LVIDd:         5.40 cm LVIDs:         4.70 cm LV PW:         1.40 cm LV IVS:        0.90 cm LVOT diam:     2.40 cm LV SV:         74 LV SV Index:   34 LVOT Area:  4.52 cm  LV Volumes (MOD) LV vol d, MOD A2C: 291.0 ml LV vol d, MOD A4C: 226.0 ml LV vol s, MOD A2C: 204.0 ml LV  vol s, MOD A4C: 163.0 ml LV SV MOD A2C:     87.0 ml LV SV MOD A4C:     226.0 ml LV SV MOD BP:      76.2 ml RIGHT VENTRICLE            IVC RV S prime:     4.53 cm/s  IVC diam: 2.90 cm TAPSE (M-mode): 0.6 cm LEFT ATRIUM              Index        RIGHT ATRIUM           Index LA diam:        4.60 cm  2.11 cm/m   RA Area:     26.30 cm LA Vol (A2C):   101.0 ml 46.31 ml/m  RA Volume:   96.60 ml  44.30 ml/m LA Vol (A4C):   112.0 ml 51.36 ml/m LA Biplane Vol: 107.0 ml 49.07 ml/m  AORTIC VALVE LVOT Vmax:   104.00 cm/s LVOT Vmean:  64.300 cm/s LVOT VTI:    0.164 m  AORTA Ao Root diam: 3.20 cm Ao Asc diam:  3.50 cm MITRAL VALVE                  TRICUSPID VALVE MV Area (PHT): 5.02 cm       TR Peak grad:   41.7 mmHg MV Area VTI:   0.61 cm       TR Vmax:        323.00 cm/s MV Peak grad:  71.2 mmHg MV Mean grad:  40.0 mmHg      SHUNTS MV Vmax:       4.22 m/s       Systemic VTI:  0.16 m MV Vmean:      288.0 cm/s     Systemic Diam: 2.40 cm MV Decel Time: 151 msec MR Peak grad:    75.7 mmHg MR Mean grad:    49.0 mmHg MR Vmax:         435.00 cm/s MR Vmean:        330.0 cm/s MR PISA:         2.26 cm MR PISA Eff ROA: 18 mm MR PISA Radius:  0.60 cm MV E velocity: 121.00 cm/s Arvilla Meres MD Electronically signed by Arvilla Meres MD Signature Date/Time: 08/27/2022/4:55:44 PM    Final    DG Chest 2 View  Result Date: 08/26/2022 CLINICAL DATA:  Shortness of breath EXAM: CHEST - 2 VIEW COMPARISON:  CT 07/10/2022, chest x-ray 07/07/2022 FINDINGS: Left-sided pacing device as before. Sternotomy with atrial appendage clip. Extensive right-sided calcified pleural plaques and chronic pleural thickening. Possible small left effusion. Cardiomegaly with central vascular congestion. Heterogeneous ground-glass opacity in the mid to lower lungs. No pneumothorax. Emphysema. Old left clavicle deformity. IMPRESSION: 1. Cardiomegaly with central vascular congestion. Heterogeneous ground-glass opacity in the mid to lower lungs may reflect  edema or pneumonia. 2. Chronic right pleural thickening with calcified pleural plaques. Electronically Signed   By: Jasmine Pang M.D.   On: 08/26/2022 16:35    Microbiology: Results for orders placed or performed during the hospital encounter of 08/26/22  SARS Coronavirus 2 by RT PCR (hospital order, performed in [facility] hospital lab) *cepheid single result test* Anterior Nasal Swab     Status: None  Collection Time: 08/26/22  4:38 PM   Specimen: Anterior Nasal Swab  Result Value Ref Range Status   SARS Coronavirus 2 by RT PCR NEGATIVE NEGATIVE Final    Comment: (NOTE) SARS-CoV-2 target nucleic acids are NOT DETECTED.  The SARS-CoV-2 RNA is generally detectable in upper and lower respiratory specimens during the acute phase of infection. The lowest concentration of SARS-CoV-2 viral copies this assay can detect is 250 copies / mL. A negative result does not preclude SARS-CoV-2 infection and should not be used as the sole basis for treatment or other patient management decisions.  A negative result may occur with improper specimen collection / handling, submission of specimen other than nasopharyngeal swab, presence of viral mutation(s) within the areas targeted by this assay, and inadequate number of viral copies (<250 copies / mL). A negative result must be combined with clinical observations, patient history, and epidemiological information.  Fact Sheet for Patients:   RoadLapTop.co.za  Fact Sheet for Healthcare Providers: http://kim-miller.com/  This test is not yet approved or  cleared by the [place] FDA and has been authorized for detection and/or diagnosis of SARS-CoV-2 by FDA under an Emergency Use Authorization (EUA).  This EUA will remain in effect (meaning this test can be used) for the duration of the COVID-19 declaration under Section 564(b)(1) of the Act, 21 U.S.C. section 360bbb-3(b)(1), unless the authorization  is terminated or revoked sooner.  Performed at [facility], [address] [place], [place] [address]     Labs: CBC: Recent Labs  Lab 09/04/22 0441 09/05/22 0530 09/06/22 0605 09/07/22 0528 09/08/22 0510  WBC 7.5 6.2 6.3 6.5 6.2  HGB 12.5* 12.8* 12.6* 12.2* 12.7*  HCT 39.8 41.1 41.2 40.4 41.1  MCV 93.0 94.3 93.4 95.1 94.7  PLT 149* 150 167 163 164   Basic Metabolic Panel: Recent Labs  Lab 09/04/22 0441 09/05/22 0530 09/06/22 0605 09/07/22 0528 09/08/22 0510  NA 137 136 135 137 136  K 4.2 4.2 3.5 3.5 4.5  CL 100 99 99 101 97*  CO2 27 27 27 29 31   GLUCOSE 94 90 88 107* 101*  BUN 19 21 18 15 15   CREATININE 1.55* 1.48* 1.40* 1.41* 1.48*  CALCIUM 8.6* 8.6* 8.2* 8.3* 8.3*  MG 2.4 2.6* 2.3 2.3 2.3   Liver Function Tests: No results for input(s): "AST", "ALT", "ALKPHOS", "BILITOT", "PROT", "ALBUMIN" in the last 168 hours. CBG: No results for input(s): "GLUCAP" in the last 168 hours.  Discharge time spent: less than 30 minutes.  Signed: Rickey Barbara, MD [facility] 09/08/2022

## 2022-09-08 NOTE — TOC Transition Note (Signed)
Transition of Care [facility]) - CM/SW Discharge Note   Patient Details  Name: Max Rivas MRN: [id number] Date of Birth: February 15, 1950  [facility] CM/SW Contact:  Lanier Clam, RN Phone Number: 09/08/2022, 8:36 AM   Clinical Narrative: Spoke to patient-DME to be delivered to home by 1p today. D/c plan [facility] home w/hospice services, dme-home 02 to be delivered to home prior PTAR.DNR.PTAR to be called once confirmed DME in the home.       Final next level of care: Home w Hospice Care Barriers to Discharge: No Barriers Identified   Patient Goals and CMS Choice CMS Medicare.gov Compare Post Acute Care list provided to:: Patient Choice offered to / list presented to : Patient  Discharge Placement                         Discharge Plan and Services Additional resources added to the After Visit Summary for     Discharge Planning Services: CM Consult Post Acute Care Choice: Resumption of Svcs/PTA Provider [facility].)                               Social Determinants of Health (SDOH) Interventions SDOH Screenings   Food Insecurity: No Food Insecurity (09/01/2022)  Housing: Low Risk  (09/01/2022)  Transportation Needs: No Transportation Needs (09/01/2022)  Utilities: Not At Risk (08/27/2022)  Alcohol Screen: Low Risk  (09/01/2022)  Financial Resource Strain: Low Risk  (09/01/2022)  Social Connections: Unknown (05/23/2021)   Received from [facility]  Tobacco Use: Medium Risk (08/27/2022)     Readmission Risk Interventions    06/25/2021    9:40 AM 04/28/2021   10:57 AM  Readmission Risk Prevention Plan  Transportation Screening Complete Complete  PCP or Specialist Appt within 3-5 Days Complete Complete  HRI or Home Care Consult Complete Complete  Social Work Consult for Recovery Care Planning/Counseling Complete Complete  Palliative Care Screening Not Applicable Not Applicable  Medication Review Oceanographer) Complete Complete

## 2022-09-09 ENCOUNTER — Encounter (HOSPITAL_COMMUNITY): Payer: No Typology Code available for payment source
